# Patient Record
Sex: Female | Born: 1935 | Race: White | Hispanic: No | Smoking: Former smoker
Health system: Southern US, Community
[De-identification: ages and names within clinical notes are randomized; demographics above are authoritative.]

## PROBLEM LIST (undated history)

## (undated) DIAGNOSIS — T7840XA Allergy, unspecified, initial encounter: Secondary | ICD-10-CM

## (undated) DIAGNOSIS — S82009A Unspecified fracture of unspecified patella, initial encounter for closed fracture: Secondary | ICD-10-CM

## (undated) DIAGNOSIS — K635 Polyp of colon: Secondary | ICD-10-CM

## (undated) DIAGNOSIS — S42109A Fracture of unspecified part of scapula, unspecified shoulder, initial encounter for closed fracture: Secondary | ICD-10-CM

## (undated) DIAGNOSIS — K3189 Other diseases of stomach and duodenum: Secondary | ICD-10-CM

## (undated) DIAGNOSIS — M1990 Unspecified osteoarthritis, unspecified site: Secondary | ICD-10-CM

## (undated) DIAGNOSIS — I6529 Occlusion and stenosis of unspecified carotid artery: Secondary | ICD-10-CM

## (undated) DIAGNOSIS — I5032 Chronic diastolic (congestive) heart failure: Secondary | ICD-10-CM

## (undated) DIAGNOSIS — K219 Gastro-esophageal reflux disease without esophagitis: Secondary | ICD-10-CM

## (undated) DIAGNOSIS — R030 Elevated blood-pressure reading, without diagnosis of hypertension: Principal | ICD-10-CM

## (undated) DIAGNOSIS — K224 Dyskinesia of esophagus: Secondary | ICD-10-CM

## (undated) DIAGNOSIS — H8190 Unspecified disorder of vestibular function, unspecified ear: Secondary | ICD-10-CM

## (undated) DIAGNOSIS — D414 Neoplasm of uncertain behavior of bladder: Secondary | ICD-10-CM

## (undated) DIAGNOSIS — E785 Hyperlipidemia, unspecified: Secondary | ICD-10-CM

## (undated) DIAGNOSIS — I509 Heart failure, unspecified: Secondary | ICD-10-CM

## (undated) DIAGNOSIS — Z8619 Personal history of other infectious and parasitic diseases: Secondary | ICD-10-CM

## (undated) DIAGNOSIS — R42 Dizziness and giddiness: Secondary | ICD-10-CM

## (undated) DIAGNOSIS — K5790 Diverticulosis of intestine, part unspecified, without perforation or abscess without bleeding: Secondary | ICD-10-CM

## (undated) DIAGNOSIS — D649 Anemia, unspecified: Secondary | ICD-10-CM

## (undated) DIAGNOSIS — E039 Hypothyroidism, unspecified: Secondary | ICD-10-CM

## (undated) DIAGNOSIS — K31A0 Gastric intestinal metaplasia, unspecified: Secondary | ICD-10-CM

## (undated) DIAGNOSIS — D1803 Hemangioma of intra-abdominal structures: Secondary | ICD-10-CM

## (undated) DIAGNOSIS — G458 Other transient cerebral ischemic attacks and related syndromes: Secondary | ICD-10-CM

## (undated) HISTORY — DX: Dyskinesia of esophagus: K22.4

## (undated) HISTORY — PX: ELBOW SURGERY: SHX618

## (undated) HISTORY — DX: Allergy, unspecified, initial encounter: T78.40XA

## (undated) HISTORY — DX: Gastro-esophageal reflux disease without esophagitis: K21.9

## (undated) HISTORY — DX: Hemangioma of intra-abdominal structures: D18.03

## (undated) HISTORY — DX: Elevated blood-pressure reading, without diagnosis of hypertension: R03.0

## (undated) HISTORY — PX: ROTATOR CUFF REPAIR: SHX139

## (undated) HISTORY — DX: Heart failure, unspecified: I50.9

## (undated) HISTORY — DX: Dizziness and giddiness: R42

## (undated) HISTORY — DX: Unspecified fracture of unspecified patella, initial encounter for closed fracture: S82.009A

## (undated) HISTORY — DX: Unspecified disorder of vestibular function, unspecified ear: H81.90

## (undated) HISTORY — DX: Neoplasm of uncertain behavior of bladder: D41.4

## (undated) HISTORY — PX: TUBAL LIGATION: SHX77

## (undated) HISTORY — DX: Anemia, unspecified: D64.9

## (undated) HISTORY — PX: CHOLECYSTECTOMY: SHX55

## (undated) HISTORY — DX: Gastric intestinal metaplasia, unspecified: K31.A0

## (undated) HISTORY — DX: Polyp of colon: K63.5

## (undated) HISTORY — DX: Hyperlipidemia, unspecified: E78.5

## (undated) HISTORY — PX: COLONOSCOPY: SHX174

## (undated) HISTORY — PX: APPENDECTOMY: SHX54

## (undated) HISTORY — PX: CYSTECTOMY: SUR359

## (undated) HISTORY — DX: Personal history of other infectious and parasitic diseases: Z86.19

## (undated) HISTORY — DX: Fracture of unspecified part of scapula, unspecified shoulder, initial encounter for closed fracture: S42.109A

## (undated) HISTORY — DX: Other diseases of stomach and duodenum: K31.89

## (undated) HISTORY — DX: Occlusion and stenosis of unspecified carotid artery: I65.29

## (undated) HISTORY — DX: Unspecified osteoarthritis, unspecified site: M19.90

## (undated) HISTORY — PX: TONSILLECTOMY: SUR1361

## (undated) HISTORY — DX: Diverticulosis of intestine, part unspecified, without perforation or abscess without bleeding: K57.90

## (undated) HISTORY — DX: Hypothyroidism, unspecified: E03.9

## (undated) HISTORY — DX: Other transient cerebral ischemic attacks and related syndromes: G45.8

---

## 1997-12-29 HISTORY — PX: CAROTID-SUBCLAVIAN BYPASS GRAFT: SHX910

## 1998-05-22 ENCOUNTER — Other Ambulatory Visit: Admission: RE | Admit: 1998-05-22 | Discharge: 1998-05-22 | Payer: Self-pay | Admitting: *Deleted

## 1999-06-06 ENCOUNTER — Other Ambulatory Visit: Admission: RE | Admit: 1999-06-06 | Discharge: 1999-06-06 | Payer: Self-pay | Admitting: Obstetrics and Gynecology

## 2000-06-11 ENCOUNTER — Other Ambulatory Visit: Admission: RE | Admit: 2000-06-11 | Discharge: 2000-06-11 | Payer: Self-pay | Admitting: Obstetrics and Gynecology

## 2001-06-15 ENCOUNTER — Other Ambulatory Visit: Admission: RE | Admit: 2001-06-15 | Discharge: 2001-06-15 | Payer: Self-pay | Admitting: Obstetrics and Gynecology

## 2002-04-19 ENCOUNTER — Encounter: Payer: Self-pay | Admitting: Internal Medicine

## 2002-04-19 ENCOUNTER — Encounter: Admission: RE | Admit: 2002-04-19 | Discharge: 2002-04-19 | Payer: Self-pay | Admitting: Internal Medicine

## 2003-03-03 ENCOUNTER — Ambulatory Visit (HOSPITAL_COMMUNITY): Admission: RE | Admit: 2003-03-03 | Discharge: 2003-03-03 | Payer: Self-pay | Admitting: Internal Medicine

## 2003-03-03 ENCOUNTER — Encounter: Payer: Self-pay | Admitting: Internal Medicine

## 2003-07-11 ENCOUNTER — Encounter: Payer: Self-pay | Admitting: Obstetrics and Gynecology

## 2003-07-11 ENCOUNTER — Ambulatory Visit (HOSPITAL_COMMUNITY): Admission: RE | Admit: 2003-07-11 | Discharge: 2003-07-11 | Payer: Self-pay | Admitting: Obstetrics and Gynecology

## 2004-03-05 ENCOUNTER — Encounter: Payer: Self-pay | Admitting: Internal Medicine

## 2004-04-11 LAB — HM COLONOSCOPY

## 2004-11-06 ENCOUNTER — Ambulatory Visit: Payer: Self-pay | Admitting: Internal Medicine

## 2005-03-28 ENCOUNTER — Encounter: Payer: Self-pay | Admitting: Internal Medicine

## 2005-03-28 ENCOUNTER — Ambulatory Visit: Payer: Self-pay | Admitting: Internal Medicine

## 2005-04-01 ENCOUNTER — Ambulatory Visit: Payer: Self-pay | Admitting: Internal Medicine

## 2006-02-23 ENCOUNTER — Ambulatory Visit: Payer: Self-pay | Admitting: Internal Medicine

## 2006-03-04 ENCOUNTER — Encounter: Payer: Self-pay | Admitting: Internal Medicine

## 2006-03-26 ENCOUNTER — Ambulatory Visit: Payer: Self-pay | Admitting: Internal Medicine

## 2006-04-02 ENCOUNTER — Ambulatory Visit: Payer: Self-pay | Admitting: Internal Medicine

## 2006-05-12 ENCOUNTER — Ambulatory Visit: Payer: Self-pay | Admitting: Internal Medicine

## 2006-09-29 ENCOUNTER — Ambulatory Visit: Payer: Self-pay | Admitting: Internal Medicine

## 2006-11-02 ENCOUNTER — Ambulatory Visit: Payer: Self-pay | Admitting: Internal Medicine

## 2007-04-29 ENCOUNTER — Ambulatory Visit: Payer: Self-pay | Admitting: Internal Medicine

## 2007-04-29 LAB — CONVERTED CEMR LAB
ALT: 19 units/L (ref 0–40)
AST: 20 units/L (ref 0–37)
BUN: 13 mg/dL (ref 6–23)
CO2: 29 meq/L (ref 19–32)
Calcium: 9.6 mg/dL (ref 8.4–10.5)
Chloride: 108 meq/L (ref 96–112)
Cholesterol: 182 mg/dL (ref 0–200)
Creatinine, Ser: 0.8 mg/dL (ref 0.4–1.2)
GFR calc Af Amer: 90 mL/min
GFR calc non Af Amer: 74 mL/min
Glucose, Bld: 88 mg/dL (ref 70–99)
HCT: 44.2 % (ref 36.0–46.0)
HDL: 55 mg/dL (ref >39.0)
Hgb A1c MFr Bld: 5.2 % (ref 4.6–6.0)
LDL Cholesterol: 93 mg/dL (ref 0–99)
Potassium: 4.2 meq/L (ref 3.5–5.1)
Sodium: 143 meq/L (ref 135–145)
TSH: 2.23 microintl units/mL (ref 0.35–5.50)
Total CHOL/HDL Ratio: 3.3
Triglycerides: 172 mg/dL — ABNORMAL HIGH (ref 0–149)
VLDL: 34 mg/dL (ref 0–40)

## 2007-05-06 ENCOUNTER — Ambulatory Visit: Payer: Self-pay | Admitting: Internal Medicine

## 2007-08-19 DIAGNOSIS — E785 Hyperlipidemia, unspecified: Secondary | ICD-10-CM | POA: Insufficient documentation

## 2007-08-19 DIAGNOSIS — K219 Gastro-esophageal reflux disease without esophagitis: Secondary | ICD-10-CM | POA: Insufficient documentation

## 2007-08-19 DIAGNOSIS — E039 Hypothyroidism, unspecified: Secondary | ICD-10-CM | POA: Insufficient documentation

## 2007-08-19 DIAGNOSIS — M810 Age-related osteoporosis without current pathological fracture: Secondary | ICD-10-CM

## 2007-08-19 DIAGNOSIS — M1990 Unspecified osteoarthritis, unspecified site: Secondary | ICD-10-CM | POA: Insufficient documentation

## 2007-09-22 ENCOUNTER — Ambulatory Visit: Payer: Self-pay | Admitting: Internal Medicine

## 2007-09-22 DIAGNOSIS — M25569 Pain in unspecified knee: Secondary | ICD-10-CM

## 2007-11-02 ENCOUNTER — Ambulatory Visit: Payer: Self-pay | Admitting: Internal Medicine

## 2007-11-02 LAB — CONVERTED CEMR LAB
Cholesterol: 171 mg/dL (ref 0–200)
HDL: 53.9 mg/dL (ref >39.0)
LDL Cholesterol: 94 mg/dL (ref 0–99)
Total CHOL/HDL Ratio: 3.2
Triglycerides: 115 mg/dL (ref 0–149)
VLDL: 23 mg/dL (ref 0–40)

## 2007-11-09 ENCOUNTER — Ambulatory Visit: Payer: Self-pay | Admitting: Internal Medicine

## 2007-11-09 DIAGNOSIS — E559 Vitamin D deficiency, unspecified: Secondary | ICD-10-CM | POA: Insufficient documentation

## 2007-11-09 DIAGNOSIS — J309 Allergic rhinitis, unspecified: Secondary | ICD-10-CM | POA: Insufficient documentation

## 2007-11-09 DIAGNOSIS — M79609 Pain in unspecified limb: Secondary | ICD-10-CM | POA: Insufficient documentation

## 2007-11-09 LAB — CONVERTED CEMR LAB: Vit D, 1,25-Dihydroxy: 28 — ABNORMAL LOW (ref 30–89)

## 2007-11-19 ENCOUNTER — Encounter: Payer: Self-pay | Admitting: Internal Medicine

## 2008-03-03 ENCOUNTER — Ambulatory Visit: Payer: Self-pay | Admitting: Internal Medicine

## 2008-03-03 LAB — CONVERTED CEMR LAB: Total CK: 58 units/L (ref 7–177)

## 2008-03-09 LAB — CONVERTED CEMR LAB: Vit D, 1,25-Dihydroxy: 30 (ref 30–89)

## 2008-03-14 ENCOUNTER — Ambulatory Visit: Payer: Self-pay | Admitting: Internal Medicine

## 2008-09-12 ENCOUNTER — Ambulatory Visit: Payer: Self-pay | Admitting: Internal Medicine

## 2008-09-12 LAB — CONVERTED CEMR LAB
BUN: 15 mg/dL (ref 6–23)
Basophils Relative: 0.9 % (ref 0.0–3.0)
CO2: 27 meq/L (ref 19–32)
Chloride: 114 meq/L — ABNORMAL HIGH (ref 96–112)
Creatinine, Ser: 0.7 mg/dL (ref 0.4–1.2)
Eosinophils Absolute: 0.1 10*3/uL (ref 0.0–0.7)
Eosinophils Relative: 2.9 % (ref 0.0–5.0)
GFR calc non Af Amer: 86 mL/min
Hemoglobin: 14.9 g/dL (ref 12.0–15.0)
MCV: 97.7 fL (ref 78.0–100.0)
Neutro Abs: 2.6 10*3/uL (ref 1.4–7.7)
Neutrophils Relative %: 58.1 % (ref 43.0–77.0)
RBC: 4.35 M/uL (ref 3.87–5.11)
VLDL: 32 mg/dL (ref 0–40)
WBC: 4.4 10*3/uL — ABNORMAL LOW (ref 4.5–10.5)

## 2008-09-19 ENCOUNTER — Ambulatory Visit: Payer: Self-pay | Admitting: Internal Medicine

## 2009-02-14 ENCOUNTER — Encounter (INDEPENDENT_AMBULATORY_CARE_PROVIDER_SITE_OTHER): Payer: Self-pay | Admitting: *Deleted

## 2009-03-20 ENCOUNTER — Ambulatory Visit: Payer: Self-pay | Admitting: Internal Medicine

## 2009-03-20 DIAGNOSIS — R7309 Other abnormal glucose: Secondary | ICD-10-CM | POA: Insufficient documentation

## 2009-03-20 DIAGNOSIS — G4700 Insomnia, unspecified: Secondary | ICD-10-CM | POA: Insufficient documentation

## 2009-04-03 LAB — CONVERTED CEMR LAB: Vit D, 25-Hydroxy: 34 ng/mL (ref 30–89)

## 2009-04-16 ENCOUNTER — Telehealth: Payer: Self-pay | Admitting: Internal Medicine

## 2009-06-18 ENCOUNTER — Telehealth: Payer: Self-pay | Admitting: *Deleted

## 2009-09-18 ENCOUNTER — Ambulatory Visit: Payer: Self-pay | Admitting: Internal Medicine

## 2009-09-18 DIAGNOSIS — D485 Neoplasm of uncertain behavior of skin: Secondary | ICD-10-CM

## 2009-09-25 LAB — CONVERTED CEMR LAB
ALT: 18 units/L (ref 0–35)
AST: 17 units/L (ref 0–37)
Alkaline Phosphatase: 48 units/L (ref 39–117)
Bilirubin, Direct: 0.1 mg/dL (ref 0.0–0.3)
CO2: 29 meq/L (ref 19–32)
Chloride: 109 meq/L (ref 96–112)
Eosinophils Relative: 3.1 % (ref 0.0–5.0)
HCT: 40.9 % (ref 36.0–46.0)
Hemoglobin: 13.9 g/dL (ref 12.0–15.0)
Lymphs Abs: 1.5 10*3/uL (ref 0.7–4.0)
Monocytes Relative: 10.1 % (ref 3.0–12.0)
Neutro Abs: 2.6 10*3/uL (ref 1.4–7.7)
Platelets: 167 10*3/uL (ref 150.0–400.0)
Sodium: 141 meq/L (ref 135–145)
TSH: 0.67 microintl units/mL (ref 0.35–5.50)
Total CHOL/HDL Ratio: 3
Total Protein: 6.6 g/dL (ref 6.0–8.3)
WBC: 4.7 10*3/uL (ref 4.5–10.5)

## 2009-10-12 ENCOUNTER — Encounter: Payer: Self-pay | Admitting: Internal Medicine

## 2010-01-09 ENCOUNTER — Telehealth: Payer: Self-pay | Admitting: *Deleted

## 2010-03-19 ENCOUNTER — Ambulatory Visit: Payer: Self-pay | Admitting: Internal Medicine

## 2010-03-19 DIAGNOSIS — R269 Unspecified abnormalities of gait and mobility: Secondary | ICD-10-CM

## 2010-03-20 ENCOUNTER — Ambulatory Visit: Payer: Self-pay | Admitting: Internal Medicine

## 2010-05-24 ENCOUNTER — Encounter: Payer: Self-pay | Admitting: Internal Medicine

## 2010-05-28 ENCOUNTER — Telehealth: Payer: Self-pay | Admitting: *Deleted

## 2010-09-17 ENCOUNTER — Ambulatory Visit: Payer: Self-pay | Admitting: Internal Medicine

## 2010-09-17 LAB — CONVERTED CEMR LAB
Albumin: 3.8 g/dL (ref 3.5–5.2)
Alkaline Phosphatase: 56 units/L (ref 39–117)
Basophils Relative: 1.2 % (ref 0.0–3.0)
CO2: 23 meq/L (ref 19–32)
Chloride: 110 meq/L (ref 96–112)
Creatinine, Ser: 0.8 mg/dL (ref 0.4–1.2)
Eosinophils Relative: 2.8 % (ref 0.0–5.0)
HCT: 42.6 % (ref 36.0–46.0)
Hemoglobin: 14.5 g/dL (ref 12.0–15.0)
LDL Cholesterol: 89 mg/dL (ref 0–99)
Lymphs Abs: 1.5 10*3/uL (ref 0.7–4.0)
Monocytes Relative: 9.5 % (ref 3.0–12.0)
Neutro Abs: 2.6 10*3/uL (ref 1.4–7.7)
RBC: 4.34 M/uL (ref 3.87–5.11)
RDW: 14.1 % (ref 11.5–14.6)
Total CHOL/HDL Ratio: 3
Triglycerides: 140 mg/dL (ref 0.0–149.0)
WBC: 4.8 10*3/uL (ref 4.5–10.5)

## 2010-09-24 ENCOUNTER — Ambulatory Visit: Payer: Self-pay | Admitting: Internal Medicine

## 2010-09-24 DIAGNOSIS — G458 Other transient cerebral ischemic attacks and related syndromes: Secondary | ICD-10-CM

## 2010-10-01 ENCOUNTER — Encounter: Payer: Self-pay | Admitting: Internal Medicine

## 2010-10-01 ENCOUNTER — Ambulatory Visit: Payer: Self-pay | Admitting: Internal Medicine

## 2010-10-01 ENCOUNTER — Telehealth: Payer: Self-pay | Admitting: Internal Medicine

## 2010-10-08 ENCOUNTER — Encounter: Payer: Self-pay | Admitting: Internal Medicine

## 2010-10-08 ENCOUNTER — Encounter: Admission: RE | Admit: 2010-10-08 | Discharge: 2010-11-11 | Payer: Self-pay | Admitting: Internal Medicine

## 2010-11-08 ENCOUNTER — Encounter: Payer: Self-pay | Admitting: Internal Medicine

## 2011-01-28 NOTE — Miscellaneous (Signed)
Summary: BONE DENSITY  Clinical Lists Changes  Orders: Added new Test order of T-Bone Densitometry (77080) - Signed Added new Test order of T-Lumbar Vertebral Assessment (77082) - Signed 

## 2011-01-28 NOTE — Assessment & Plan Note (Signed)
Summary: FUP//CCM---PT [facility] // RS   Vital Signs:  Patient profile:   77 year old female Menstrual status:  postmenopausal Height:      62.25 inches Weight:      129.5 pounds BMI:     23.58 Pulse rate:   69 / minute BP sitting:   124 / 60  (right arm) Cuff size:   regular  Vitals Entered By: Romualdo Bolk, CMA (AAMA) (March 19, 2010 2:05 PM) CC: follow-up visit   History of Present Illness: Amy Fischer comesin .fr  6 month follow up of multiple  medical issues .  THyroid :  No change in meds  . Bone health Allergies : stable  HRT   taking 2 x per week.   Balance. getting worse  no falling  no weakness or numbness  wonder if  anything to do  ringing one ear right  for a while   also describes   8 seconds of blurred vision episode but no loss of vision or diplopia .    last one over a year ago. ( see 2007 )   denies  dementia symptom  Skin:   precancer  rx with freeze by derm    Preventive Screening-Counseling & Management  Alcohol-Tobacco     Alcohol drinks/day: <1     Alcohol type: wine and beer     Smoking Status: quit     Year Quit: 19 years ago  Caffeine-Diet-Exercise     Caffeine use/day: 0     Does Patient Exercise: yes     Type of exercise: aerobic or walking     Exercise (avg: min/session): 30-60     Times/week: 3  Current Medications (verified): 1)  Astelin 137 Mcg/spray Soln (Azelastine Hcl) .... Spray 2 Spray Into Both Nostrils Twice A Day 2)  Crestor 5 Mg Tabs (Rosuvastatin Calcium) .... Take 1/2 Tablet By Mouth Once A Day 3)  Levothyroxine Sodium 100 Mcg Tabs (Levothyroxine Sodium) .... Take 1 Tablet By Mouth Once A Day 4)  Ranitidine Hcl 300 Mg Tabs (Ranitidine Hcl) .[name].. 1 Tab By Mouth Two Times A Day 5)  Bayer Aspirin 325 Mg  Tabs (Aspirin) 6)  Estropipate 0.75 Mg  Tabs (Estropipate) .[name].. 1 By Mouth Once Daily. Dr Ambrose Mantle 7)  Medroxyprogesterone Acetate 5 Mg  Tabs (Medroxyprogesterone Acetate) .[name].. 1 By Mouth Once Daily Dr. Ambrose Mantle 8)  Zetia 10 Mg   Tabs (Ezetimibe) .[name].. 1 By Mouth Once Daily 9)  Fosamax Plus D 70-2800 Mg-Unit  Tabs (Alendronate-Cholecalciferol) .[name].. 1 By Mouth Every Week 10)  Multi-Vitamin   Tabs (Multiple Vitamin) 11)  Calcium 500/d 500-125 Mg-Unit  Tabs (Calcium Carbonate-Vitamin D) 12)  Glucosamine-Chondroitin 500-400 Mg  Caps (Glucosamine-Chondroitin) 13)  Fish Oil   Oil (Fish Oil) 14)  Stool Softener 100 Mg Caps (Docusate Sodium) 15)  Vitamin D 1000 Unit  Tabs (Cholecalciferol)  Allergies (verified): 1)  ! Codeine 2)  ! Actonel (Risedronate Sodium) 3)  ! * Statins  Past History:  Past medical, surgical, family and social histories (including risk factors) reviewed, and no changes noted (except as noted below).  Past Medical History: Reviewed history from 09/19/2008 and no changes required. GERD Hyperlipidemia Hypothyroidism Osteoarthritis Osteoporosis childbirth x4 mri head 03  recurrent vertigo Bladder polyps Allergic rhinitis     Past Surgical History: Appendectomy Cholecystectomy Colonoscopy-04/11/2004 Left carotid subclavian bypass1999   for [place] steal syndrome.  Past History:  Care Management:  Gynecology: Dr. Ambrose Mantle Vascular  in past : [facility]  History: Reviewed history from 09/18/2009 and no changes required. Family History of Arthritis Family History High cholesterol Family History Hypertension Family History Kidney disease Family History of Prostate CA 1st degree relative <50 Family History of Cardiovascular disorder Fam hx Stroke   Social History: Reviewed history from 09/18/2009 and no changes required. Former Smoker Alcohol use-yes Drug use-no Regular exercise-yes Widowed    HH of 1   no pets   Review of Systems  The patient denies anorexia, fever, weight loss, weight gain, hoarseness, chest pain, syncope, dyspnea on exertion, peripheral edema, prolonged cough, headaches, hemoptysis, abdominal pain, melena, hematochezia, severe indigestion/heartburn,  hematuria, muscle weakness, transient blindness, depression, unusual weight change, abnormal bleeding, enlarged lymph nodes, and angioedema.    Physical Exam  General:  alert, well-developed, well-nourished, well-hydrated, and normal appearance.  for age  Head:  normocephalic and atraumatic.   Eyes:  vision grossly intact, pupils equal, and pupils round.   Ears:  R ear normal, L ear normal, and no external deformities.   Neck:  No deformities, masses, or tenderness noted. Lungs:  Normal respiratory effort, chest expands symmetrically. Lungs are clear to auscultation, no crackles or wheezes.no dullness.   Heart:  Normal rate and regular rhythm. S1 and S2 normal without gallop, murmur, click, rub or other extra sounds. Abdomen:  Bowel sounds positive,abdomen soft and non-tender without masses, organomegaly or   noted. Msk:  no joint warmth and no redness over joints.  defomity   Pulses:  pulses intact without delay   Extremities:  no clubbing cyanosis or edema  Neurologic:  alert & oriented X3, strength normal in all extremities, sensation intact to light touch, and gait normal.  no clonus   absent  vibration sense  in distal foot bilaterally   Skin:  turgor normal and color normal.  senile changes  Cervical Nodes:  No lymphadenopathy noted Psych:  Oriented X3, memory intact for recent and remote, normally interactive, good eye contact, not anxious appearing, and not depressed appearing.     Impression & Recommendations:  Problem # 1:  HYPOTHYROIDISM (ICD-244.9)  Her updated medication list for this problem includes:    Levothyroxine Sodium 100 Mcg Tabs (Levothyroxine sodium) .[name]... Take 1 tablet by mouth once a day  Labs Reviewed: TSH: 0.67 (09/18/2009)    HgBA1c: 5.4 (03/20/2009) Chol: 157 (09/18/2009)   HDL: 52.70 (09/18/2009)   LDL: 82 (09/18/2009)   TG: 114.0 (09/18/2009)  Problem # 2:  OSTEOPOROSIS (ICD-733.00)  Her updated medication list for this problem includes:     Fosamax Plus D 70-2800 Mg-unit Tabs (Alendronate-cholecalciferol) .[name]... 1 by mouth every week    Calcium 500/d 500-125 Mg-unit Tabs (Calcium carbonate-vitamin d)    Vitamin D 1000 Unit Tabs (Cholecalciferol)  Vit D:34 (03/20/2009), 27 (09/12/2008)  Problem # 3:  OSTEOARTHRITIS (ICD-715.90)  hands are seemingly worse and deformed left thumb and middle finger   had evalu about 8 years ago and told she had OA  .  progressive   more involved pip joints  not a lot on dip.  affecting function Her updated medication list for this problem includes:    Bayer Aspirin 325 Mg Tabs (Aspirin)  Orders: T-Hand Left 3 Views (73130TC) T-Hand Right 3 views (73130TC)  Discussed use of medications,  Problem # 4:  HYPERLIPIDEMIA (ICD-272.4)  low dose    meds  Her updated medication list for this problem includes:    Crestor 5 Mg Tabs (Rosuvastatin calcium) .[name]... Take 1/2 tablet by mouth once a day  Zetia 10 Mg Tabs (Ezetimibe) .[name]... 1 by mouth once daily  Labs Reviewed: SGOT: 17 (09/18/2009)   SGPT: 18 (09/18/2009)   HDL:52.70 (09/18/2009), 54.6 (09/12/2008)  LDL:82 (09/18/2009), 111 (16/09/9603)  Chol:157 (09/18/2009), 197 (09/12/2008)  Trig:114.0 (09/18/2009), 158 (09/12/2008)  Problem # 5:  vision spells  rare and short lived .  x 1  no diplopia .  Unsure what cause of this is.   Problem # 6:  GAIT IMBALANCE (ICD-781.2)  dec vibration sense all toes distal foot .      ?  if peripheral neuropathy early        no hx diabetes althoug  had some hyperglycemia in the past.   Orders: Neurology Referral (Neuro)  Problem # 7:  hx [place] steal   with stent  no signs and well with this.   Complete Medication List: 1)  Astelin 137 Mcg/spray Soln (Azelastine hcl) .... Spray 2 spray into both nostrils twice a day 2)  Crestor 5 Mg Tabs (Rosuvastatin calcium) .... Take 1/2 tablet by mouth once a day 3)  Levothyroxine Sodium 100 Mcg Tabs (Levothyroxine sodium) .... Take 1 tablet by mouth once a day 4)   Ranitidine Hcl 300 Mg Tabs (Ranitidine hcl) .[name].. 1 tab by mouth two times a day 5)  Bayer Aspirin 325 Mg Tabs (Aspirin) 6)  Estropipate 0.75 Mg Tabs (Estropipate) .[name].. 1 by mouth once daily. dr Ambrose Mantle 7)  Medroxyprogesterone Acetate 5 Mg Tabs (Medroxyprogesterone acetate) .[name].. 1 by mouth once daily dr. Ambrose Mantle 8)  Zetia 10 Mg Tabs (Ezetimibe) .[name].. 1 by mouth once daily 9)  Fosamax Plus D 70-2800 Mg-unit Tabs (Alendronate-cholecalciferol) .[name].. 1 by mouth every week 10)  Multi-vitamin Tabs (Multiple vitamin) 11)  Calcium 500/d 500-125 Mg-unit Tabs (Calcium carbonate-vitamin d) 12)  Glucosamine-chondroitin 500-400 Mg Caps (Glucosamine-chondroitin) 13)  Fish Oil Oil (Fish oil) 14)  Stool Softener 100 Mg Caps (Docusate sodium) 15)  Vitamin D 1000 Unit Tabs (Cholecalciferol)  Patient Instructions: 1)  get xray of hands  when convenient.    2)  Someone Will contact you about  neurology consult . 3)  no change in meds .  4)  Please schedule a follow-up appointment in 6 months .  5)  BMP prior to visit, ICD-9:  6)  Hepatic Panel prior to visit ICD-9:  7)  Lipid panel prior to visit ICD-9 :  8)  TSH prior to visit ICD-9 :  9)  CBC w/ Diff prior to visit ICD-9 :  10)  B12 :  balance problem.

## 2011-01-28 NOTE — Progress Notes (Signed)
Summary: can't fill rx   Phone Note Call from Patient Call back at Home Phone [phone]   Caller: Patient Summary of Call: Pt called saying fhat she got a notice from her mail order company saying that they no longer can get her generic zantac 300mg . I left a message for pt to call back to let us know where she would like [place] to send rx to. Initial call taken by: Romualdo Bolk, CMA Duncan Dull),  January 09, 2010 2:04 PM  Follow-up for Phone Call        Pt called back saying that she wanted rx called into [facility]. Pt wants 90 days called in. Rx sent electronically. Follow-up by: Romualdo Bolk, CMA (AAMA),  January 09, 2010 4:55 PM    Prescriptions: RANITIDINE HCL 300 MG CAPS (RANITIDINE HCL) Take 1 capsule by mouth twice a day  #180 x 3   Entered by:   Romualdo Bolk, CMA (AAMA)   Authorized by:   Madelin Headings MD   Signed by:   Romualdo Bolk, CMA (AAMA) on 01/09/2010   Method used:   Electronically to        [facility]* (retail)       [address] [place], [place]  [address]       Ph: [phone]       Fax: [phone]   RxID:   [id number]   Appended Document: can't fill rx     Clinical Lists Changes  Medications: Changed medication from RANITIDINE HCL 300 MG CAPS (RANITIDINE HCL) Take 1 capsule by mouth twice a day to RANITIDINE HCL 300 MG TABS (RANITIDINE HCL) 1 tab by mouth two times a day

## 2011-01-28 NOTE — Miscellaneous (Signed)
Summary: Discharge Summary for PT [facility]  Discharge Summary for PT Services/[facility]   Imported By: Maryln Gottron 11/20/2010 13:54:25  _____________________________________________________________________  External Attachment:    Type:   Image     Comment:   External Document

## 2011-01-28 NOTE — Progress Notes (Signed)
  Phone Note Call from Patient Call back at [facility] Phone [phone]   Caller: Patient Summary of Call: Pt needs refills on fosamax, synthroid, zetia and crestor. Pt wants [place] to print rx's so she can mail it to them. Initial call taken by: Romualdo Bolk, CMA Duncan Dull),  May 28, 2010 5:01 PM  Follow-up for Phone Call        Pt to pick up on thurs Follow-up by: Romualdo Bolk, CMA (AAMA),  May 28, 2010 5:06 PM    Prescriptions: FOSAMAX PLUS D 70-2800 MG-UNIT  TABS (ALENDRONATE-CHOLECALCIFEROL) 1 by mouth every week  #12 x 3   Entered by:   Romualdo Bolk, CMA (AAMA)   Authorized by:   Madelin Headings MD   Signed by:   Romualdo Bolk, CMA (AAMA) on 05/28/2010   Method used:   Print then Give to Patient   RxID:   [id number] ZETIA 10 MG  TABS (EZETIMIBE) 1 by mouth once daily  #90 x 3   Entered by:   Romualdo Bolk, CMA (AAMA)   Authorized by:   Madelin Headings MD   Signed by:   Romualdo Bolk, CMA (AAMA) on 05/28/2010   Method used:   Print then Give to Patient   RxID:   [id number] LEVOTHYROXINE SODIUM 100 MCG TABS (LEVOTHYROXINE SODIUM) Take 1 tablet by mouth once a day  #90 x 3   Entered by:   Romualdo Bolk, CMA (AAMA)   Authorized by:   Madelin Headings MD   Signed by:   Romualdo Bolk, CMA (AAMA) on 05/28/2010   Method used:   Print then Give to Patient   RxID:   [id number] CRESTOR 5 MG TABS (ROSUVASTATIN CALCIUM) Take 1/2 tablet by mouth once a day  #45 x 3   Entered by:   Romualdo Bolk, CMA (AAMA)   Authorized by:   Madelin Headings MD   Signed by:   Romualdo Bolk, CMA (AAMA) on 05/28/2010   Method used:   Print then Give to Patient   RxID:   [id number]

## 2011-01-28 NOTE — Assessment & Plan Note (Signed)
Summary: 6 month rov/njr   Vital Signs:  Patient profile:   77 year old female Menstrual status:  postmenopausal Weight:      125 pounds Pulse rate:   72 / minute BP sitting:   120 / 60  (left arm) Cuff size:   regular  Vitals Entered By: Romualdo Bolk, CMA (AAMA) (September 24, 2010 1:09 PM) CC: Follow-up visit on labs   History of Present Illness: Amy Fischer comes in today  for follow up of multiple medical problems . Since last visit  then has seen neurologist . was told she could have mild peripheral neuropathy and that could effeect her ballance . Feels tired and worried about falling because balance seems off. Sleep  off and on melatonin off and on.  has seen Dr Webb Silversmith no problems  LIPIDS: no known se of meds  Carotid subclavian bypass   hx no dizziness neuro signs otherwise  Thyroid no change  in meds   Preventive Screening-Counseling & Management  Alcohol-Tobacco     Alcohol drinks/day: <1     Alcohol type: wine and beer     Smoking Status: quit     Year Quit: 19 years ago  Caffeine-Diet-Exercise     Caffeine use/day: 0     Does Patient Exercise: yes     Type of exercise: aerobic or walking     Exercise (avg: min/session): 30-60     Times/week: 3  Safety-Violence-Falls     Fall Risk: tripped in tub.      Fall Risk Counseling: counseling provided; falls with injury noted  Current Medications (verified): 1)  Astelin 137 Mcg/spray Soln (Azelastine Hcl) .... Spray 2 Spray Into Both Nostrils Twice A Day 2)  Crestor 5 Mg Tabs (Rosuvastatin Calcium) .... Take 1/2 Tablet By Mouth Once A Day 3)  Levothyroxine Sodium 100 Mcg Tabs (Levothyroxine Sodium) .... Take 1 Tablet By Mouth Once A Day 4)  Ranitidine Hcl 300 Mg Tabs (Ranitidine Hcl) .[name].. 1 Tab By Mouth Two Times A Day 5)  Bayer Aspirin 325 Mg  Tabs (Aspirin) 6)  Estropipate 0.75 Mg  Tabs (Estropipate) .[name].. 1 By Mouth 3 Times A Week. Dr Ambrose Mantle 7)  Medroxyprogesterone Acetate 5 Mg  Tabs  (Medroxyprogesterone Acetate) .[name].. 1 By Mouth 3 Times A Week Dr. Ambrose Mantle 8)  Zetia 10 Mg  Tabs (Ezetimibe) .[name].. 1 By Mouth Once Daily 9)  Fosamax Plus D 70-2800 Mg-Unit  Tabs (Alendronate-Cholecalciferol) .[name].. 1 By Mouth Every Week 10)  Multi-Vitamin   Tabs (Multiple Vitamin) 11)  Calcium 500/d 500-125 Mg-Unit  Tabs (Calcium Carbonate-Vitamin D) 12)  Glucosamine-Chondroitin 500-400 Mg  Caps (Glucosamine-Chondroitin) 13)  Fish Oil   Oil (Fish Oil) 14)  Stool Softener 100 Mg Caps (Docusate Sodium) 15)  Vitamin D 1000 Unit  Tabs (Cholecalciferol) 16)  Melatonin 3 Mg Tabs (Melatonin) .... Takes Every Other Month 17)  Diphenhydramine Hcl 25 Mg Caps (Diphenhydramine Hcl)  Allergies (verified): 1)  ! Codeine 2)  ! Actonel (Risedronate Sodium) 3)  ! * Statins  Past History:  Past medical, surgical, family and social histories (including risk factors) reviewed, and no changes noted (except as noted below).  Past Medical History: Reviewed history from 09/19/2008 and no changes required. GERD Hyperlipidemia Hypothyroidism Osteoarthritis Osteoporosis childbirth x4 mri head 03  recurrent vertigo Bladder polyps Allergic rhinitis     Past Surgical History: Reviewed history from 03/19/2010 and no changes required. 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  Past History:  Care Management:  Gynecology: Dr. Ambrose Mantle Vascular  in past : Amy Fischer [facility]  Family History: Reviewed history from 03/19/2010 and no changes required. Family History of Arthritis Family History High cholesterol Family History Hypertension Family History Kidney disease Family History of Prostate CA 1st degree relative <50 Family History of Cardiovascular disorder Fam hx Stroke   Social History: Reviewed history from 09/18/2009 and no changes required. Former Smoker Alcohol use-yes Drug use-no Regular exercise-yes Widowed    HH of 1    no pets Fall Risk:  tripped in tub.   Review of Systems  The patient denies anorexia, fever, weight loss, weight gain, chest pain, syncope, dyspnea on exertion, peripheral edema, prolonged cough, melena, hematochezia, muscle weakness, transient blindness, abnormal bleeding, enlarged lymph nodes, and angioedema.         acid reflux       elevated  hob.  to help  Physical Exam  General:  Well-developed,well-nourished,in no acute distress; alert,appropriate and cooperative throughout examination Head:  normocephalic and atraumatic.   Eyes:  vision grossly intact.   Neck:  No deformities, masses, or tenderness noted. Lungs:  Normal respiratory effort, chest expands symmetrically. Lungs are clear to auscultation, no crackles or wheezes.no dullness.   Heart:  Normal rate and regular rhythm. S1 and S2 normal without gallop, murmur, click, rub or other extra sounds. Abdomen:  Bowel sounds positive,abdomen soft and non-tender without masses, organomegaly or   noted. Msk:  no joint swelling and no joint warmth.  oa changes no watmth Pulses:  pulses intact without delay   Extremities:  no clubbing cyanosis or edema  Neurologic:  alert & oriented X3 and strength normal in all extremities.  Pt is A&Ox3,affect,speech,memory,attention,&motor skills appear intact.    gait is actually very good . but waits when arises to get balance  Skin:  turgor normal, no ecchymoses, and no petechiae.   Cervical Nodes:  No lymphadenopathy noted Psych:  Oriented X3, memory intact for recent and remote, normally interactive, good eye contact, not anxious appearing, and not depressed appearing.     Impression & Recommendations:  Problem # 1:  HYPERLIPIDEMIA (ICD-272.4)  Her updated medication list for this problem includes:    Crestor 5 Mg Tabs (Rosuvastatin calcium) .[name]... Take 1/2 tablet by mouth once a day    Zetia 10 Mg Tabs (Ezetimibe) .[name]... 1 by mouth once daily  Labs Reviewed: SGOT: 22 (09/17/2010)   SGPT:  17 (09/17/2010)   HDL:59.40 (09/17/2010), 52.70 (09/18/2009)  LDL:89 (09/17/2010), 82 (09/18/2009)  Chol:176 (09/17/2010), 157 (09/18/2009)  Trig:140.0 (09/17/2010), 114.0 (09/18/2009)  Problem # 2:  GAIT IMBALANCE (ICD-781.2) no falling   poss from slight Peripheral  neuropathy  Problem # 3:  ALLERGIC RHINITIS (ICD-477.9)  Her updated medication list for this problem includes:    Astelin 137 Mcg/spray Soln (Azelastine hcl) ..... Spray 2 spray into both nostrils twice a day    Diphenhydramine Hcl 25 Mg Caps (Diphenhydramine hcl)  Problem # 4:  OSTEOARTHRITIS (ICD-715.90)  Her updated medication list for this problem includes:    Bayer Aspirin 325 Mg Tabs (Aspirin)  Problem # 5:  GERD (ICD-530.81)  Her updated medication list for this problem includes:    Ranitidine Hcl 300 Mg Tabs (Ranitidine hcl) .[name]... 1 tab by mouth two times a day  Problem # 6:  OSTEOPOROSIS (ICD-733.00)  Her updated medication list for this problem includes:    Fosamax Plus D 70-2800 Mg-unit Tabs (Alendronate-cholecalciferol) .[name]... 1 by mouth every week  Calcium 500/d 500-125 Mg-unit Tabs (Calcium carbonate-vitamin d)    Vitamin D 1000 Unit Tabs (Cholecalciferol)  Vit D:34 (03/20/2009), 27 (09/12/2008)  Problem # 7:  HYPOTHYROIDISM (ICD-244.9)  Her updated medication list for this problem includes:    Levothyroxine Sodium 100 Mcg Tabs (Levothyroxine sodium) .[name]... Take 1 tablet by mouth once a day  Labs Reviewed: TSH: 0.37 (09/17/2010)    HgBA1c: 5.4 (03/20/2009) Chol: 176 (09/17/2010)   HDL: 59.40 (09/17/2010)   LDL: 89 (09/17/2010)   TG: 140.0 (09/17/2010)  Complete Medication List: 1)  Astelin 137 Mcg/spray Soln (Azelastine hcl) .... Spray 2 spray into both nostrils twice a day 2)  Crestor 5 Mg Tabs (Rosuvastatin calcium) .... Take 1/2 tablet by mouth once a day 3)  Levothyroxine Sodium 100 Mcg Tabs (Levothyroxine sodium) .... Take 1 tablet by mouth once a day 4)  Ranitidine Hcl 300 Mg Tabs  (Ranitidine hcl) .[name].. 1 tab by mouth two times a day 5)  Bayer Aspirin 325 Mg Tabs (Aspirin) 6)  Estropipate 0.75 Mg Tabs (Estropipate) .[name].. 1 by mouth 3 times a week. dr Ambrose Mantle 7)  Medroxyprogesterone Acetate 5 Mg Tabs (Medroxyprogesterone acetate) .[name].. 1 by mouth 3 times a week dr. Ambrose Mantle 8)  Zetia 10 Mg Tabs (Ezetimibe) .[name].. 1 by mouth once daily 9)  Fosamax Plus D 70-2800 Mg-unit Tabs (Alendronate-cholecalciferol) .[name].. 1 by mouth every week 10)  Multi-vitamin Tabs (Multiple vitamin) 11)  Calcium 500/d 500-125 Mg-unit Tabs (Calcium carbonate-vitamin d) 12)  Glucosamine-chondroitin 500-400 Mg Caps (Glucosamine-chondroitin) 13)  Fish Oil Oil (Fish oil) 14)  Stool Softener 100 Mg Caps (Docusate sodium) 15)  Vitamin D 1000 Unit Tabs (Cholecalciferol) 16)  Melatonin 3 Mg Tabs (Melatonin) .... Takes every other month 17)  Diphenhydramine Hcl 25 Mg Caps (Diphenhydramine hcl)  Other Orders: Flu Vaccine 60yrs + MEDICARE PATIENTS (Z6109) Administration Flu vaccine - MCR (U0454)  Patient Instructions: 1)  will get neuro note and then plan any other balance training.  2)  no change in meds . 3)  labs are good.  4)  schedule DEXA scan  . will let you know results when avaialble. 5)  rov in 6 months  or as needed.  Prescriptions: FOSAMAX PLUS D 70-2800 MG-UNIT  TABS (ALENDRONATE-CHOLECALCIFEROL) 1 by mouth every week  #12 x 3   Entered by:   Romualdo Bolk, CMA (AAMA)   Authorized by:   Madelin Headings MD   Signed by:   Romualdo Bolk, CMA (AAMA) on 09/24/2010   Method used:   Faxed to ...       [facility])       [address] [address]       [place], [place]  [address]       Ph: [phone]       Fax: [phone]   RxID:   [id number] ZETIA 10 MG  TABS (EZETIMIBE) 1 by mouth once daily  #90 x 3   Entered by:   Romualdo Bolk, CMA (AAMA)   Authorized by:   Madelin Headings MD   Signed by:   Romualdo Bolk, CMA (AAMA) on 09/24/2010   Method used:   Faxed to ...        [facility])       [address] [address]       [place], [place]  [address]       Ph: [phone]       Fax: [phone]   RxID:   [id number] RANITIDINE HCL 300 MG TABS (  RANITIDINE HCL) 1 tab by mouth two times a day  #180 x 3   Entered by:   Romualdo Bolk, CMA (AAMA)   Authorized by:   Madelin Headings MD   Signed by:   Romualdo Bolk, CMA (AAMA) on 09/24/2010   Method used:   Faxed to ...       [facility])       [address] [address]       [place], [place]  [address]       Ph: [phone]       Fax: [phone]   RxID:   [id number] LEVOTHYROXINE SODIUM 100 MCG TABS (LEVOTHYROXINE SODIUM) Take 1 tablet by mouth once a day  #90 x 3   Entered by:   Romualdo Bolk, CMA (AAMA)   Authorized by:   Madelin Headings MD   Signed by:   Romualdo Bolk, CMA (AAMA) on 09/24/2010   Method used:   Faxed to ...       [facility])       [address] [address]       [place], [place]  [address]       Ph: [phone]       Fax: [phone]   RxID:   [id number] CRESTOR 5 MG TABS (ROSUVASTATIN CALCIUM) Take 1/2 tablet by mouth once a day  #45 x 3   Entered by:   Romualdo Bolk, CMA (AAMA)   Authorized by:   Madelin Headings MD   Signed by:   Romualdo Bolk, CMA (AAMA) on 09/24/2010   Method used:   Faxed to ...       [facility])       [address] [address]       [place], [place]  [address]       Ph: [phone]       Fax: [phone]   RxID:   [id number]  Flu Vaccine Consent Questions     Do you have a history of severe allergic reactions to this vaccine? no    Any prior history of allergic reactions to egg and/or gelatin? no    Do you have a sensitivity to the preservative Thimersol? no    Do you have a past history of Guillan-Barre Syndrome? no    Do you currently have an acute febrile illness? no    Have you ever had a severe reaction to latex? no    Vaccine information given and explained to patient? yes    Are you currently pregnant?  no    Lot Number:[id number]   Exp Date:06/28/2011   Site Given  Left Deltoid IMflu Romualdo Bolk, CMA (AAMA)  September 24, 2010 1:13 PM

## 2011-01-28 NOTE — Progress Notes (Signed)
Summary: Amy Fischer keeps her up and gives her wild thoughts  Phone Note Call from Patient Call back at [facility] Phone [phone]   Caller: Patient Summary of Call: Pt called saying that she has tried Amy Fischer on 3 different occ. but can't take it due to it keeping her up at night and it gives her wild thoughts. She is wanting to know if there is another medication that she can try. Initial call taken by: Romualdo Bolk, CMA (AAMA),  October 01, 2010 12:04 PM  Follow-up for Phone Call        stop the medicine .   is sleeping  more of an issues or her nose congestion at this point  ? Also   lets go ahead and do a  referral for balance training fall prevention   Follow-up by: Madelin Headings MD,  October 01, 2010 12:45 PM  Additional Follow-up for Phone Call Additional follow up Details #1::        Spoke with pt and she is going to stop the medication. Pt states that the sleeping is more the problem. Order sent to [facility] for balance training fall prevention. Additional Follow-up by: Romualdo Bolk, CMA (AAMA),  October 01, 2010 1:43 PM   New Allergies: * SILENOR Additional Follow-up for Phone Call Additional follow up Details #2::    is she taking the mealtonin every night?   would try this first  then could try a sleep aid samples of low dose lunesta  1 mg      to begin (   if she want to try this. i have samples) .   Follow-up by: Madelin Headings MD,  October 03, 2010 12:14 PM  Additional Follow-up for Phone Call Additional follow up Details #3:: Details for Additional Follow-up Action Taken: LMTOCB Romualdo Bolk, CMA Duncan Dull)  October 03, 2010 12:23 PM Pt called back saying that she is taking melatonin and this has worked so far. She will call back if she has any more problems. Additional Follow-up by: Romualdo Bolk, CMA (AAMA),  October 04, 2010 8:44 AM  New Allergies: Rush [place]mark

## 2011-01-31 NOTE — Letter (Signed)
Summary: [facility]  [facility]   Imported By: Maryln Gottron 06/12/2010 13:02:28  _____________________________________________________________________  External Attachment:    Type:   Image     Comment:   External Document

## 2011-01-31 NOTE — Miscellaneous (Signed)
Summary: Initial Summary for PT Services/[facility]  Initial Summary for PT Services/[facility]   Imported By: Maryln Gottron 10/17/2010 11:03:51  _____________________________________________________________________  External Attachment:    Type:   Image     Comment:   External Document

## 2011-03-20 ENCOUNTER — Encounter: Payer: Self-pay | Admitting: Internal Medicine

## 2011-03-25 ENCOUNTER — Ambulatory Visit (INDEPENDENT_AMBULATORY_CARE_PROVIDER_SITE_OTHER): Payer: Medicare Other | Admitting: Internal Medicine

## 2011-03-25 ENCOUNTER — Encounter: Payer: Self-pay | Admitting: Internal Medicine

## 2011-03-25 VITALS — BP 130/80 | HR 60 | Wt 128.0 lb

## 2011-03-25 DIAGNOSIS — E039 Hypothyroidism, unspecified: Secondary | ICD-10-CM

## 2011-03-25 DIAGNOSIS — R7309 Other abnormal glucose: Secondary | ICD-10-CM

## 2011-03-25 DIAGNOSIS — E785 Hyperlipidemia, unspecified: Secondary | ICD-10-CM

## 2011-03-25 DIAGNOSIS — M1990 Unspecified osteoarthritis, unspecified site: Secondary | ICD-10-CM

## 2011-03-25 DIAGNOSIS — IMO0001 Reserved for inherently not codable concepts without codable children: Secondary | ICD-10-CM

## 2011-03-25 DIAGNOSIS — R030 Elevated blood-pressure reading, without diagnosis of hypertension: Secondary | ICD-10-CM

## 2011-03-25 HISTORY — DX: Reserved for inherently not codable concepts without codable children: IMO0001

## 2011-03-25 LAB — GLUCOSE, POCT (MANUAL RESULT ENTRY): POC Glucose: 96
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
## 2011-03-25 NOTE — Patient Instructions (Signed)
Check your blood pressure readings  At least 3 times a week when relaxed  .  If the readings  Are consistently over 150  On the top reading then  Call for advice or return office visit  Otherwise  return office visit for medicare wellness visit in 6 months

## 2011-04-04 ENCOUNTER — Encounter: Payer: Self-pay | Admitting: Internal Medicine

## 2011-04-04 NOTE — Assessment & Plan Note (Signed)
Bp readings borderline today has hx of elvation  In office and ok at home   Not checke recently   She gfeels was elevated from anxiety

## 2011-09-23 ENCOUNTER — Encounter: Payer: Self-pay | Admitting: Internal Medicine

## 2011-09-23 ENCOUNTER — Ambulatory Visit (INDEPENDENT_AMBULATORY_CARE_PROVIDER_SITE_OTHER): Payer: Medicare Other | Admitting: Internal Medicine

## 2011-09-23 VITALS — BP 110/70 | HR 60 | Ht 61.75 in | Wt 127.0 lb

## 2011-09-23 DIAGNOSIS — E785 Hyperlipidemia, unspecified: Secondary | ICD-10-CM

## 2011-09-23 DIAGNOSIS — Z0000 Encounter for general adult medical examination without abnormal findings: Secondary | ICD-10-CM

## 2011-09-23 DIAGNOSIS — R7309 Other abnormal glucose: Secondary | ICD-10-CM

## 2011-09-23 DIAGNOSIS — E039 Hypothyroidism, unspecified: Secondary | ICD-10-CM

## 2011-09-23 DIAGNOSIS — Z136 Encounter for screening for cardiovascular disorders: Secondary | ICD-10-CM

## 2011-09-23 DIAGNOSIS — G458 Other transient cerebral ischemic attacks and related syndromes: Secondary | ICD-10-CM

## 2011-09-23 DIAGNOSIS — M1990 Unspecified osteoarthritis, unspecified site: Secondary | ICD-10-CM

## 2011-09-23 DIAGNOSIS — K219 Gastro-esophageal reflux disease without esophagitis: Secondary | ICD-10-CM

## 2011-09-23 DIAGNOSIS — Z23 Encounter for immunization: Secondary | ICD-10-CM

## 2011-09-23 DIAGNOSIS — R030 Elevated blood-pressure reading, without diagnosis of hypertension: Secondary | ICD-10-CM

## 2011-09-23 DIAGNOSIS — M810 Age-related osteoporosis without current pathological fracture: Secondary | ICD-10-CM

## 2011-09-23 DIAGNOSIS — Z79890 Hormone replacement therapy (postmenopausal): Secondary | ICD-10-CM

## 2011-09-23 LAB — HEPATIC FUNCTION PANEL
ALT: 16 U/L (ref 0–35)
AST: 21 U/L (ref 0–37)
Bilirubin, Direct: 0 mg/dL (ref 0.0–0.3)
Total Bilirubin: 0.4 mg/dL (ref 0.3–1.2)

## 2011-09-23 LAB — BASIC METABOLIC PANEL
BUN: 16 mg/dL (ref 6–23)
Chloride: 108 mEq/L (ref 96–112)
Creatinine, Ser: 0.8 mg/dL (ref 0.4–1.2)
GFR: 77.73 mL/min (ref >60.00)
Potassium: 5.1 mEq/L (ref 3.5–5.1)

## 2011-09-23 LAB — HEMOGLOBIN A1C: Hgb A1c MFr Bld: 5.3 % (ref 4.6–6.5)

## 2011-09-23 LAB — CBC WITH DIFFERENTIAL/PLATELET
Basophils Absolute: 0 10*3/uL (ref 0.0–0.1)
Eosinophils Absolute: 0.1 10*3/uL (ref 0.0–0.7)
Lymphocytes Relative: 23.9 % (ref 12.0–46.0)
MCHC: 33.2 g/dL (ref 30.0–36.0)
Monocytes Relative: 7.8 % (ref 3.0–12.0)
Neutrophils Relative %: 66.8 % (ref 43.0–77.0)
RDW: 14.5 % (ref 11.5–14.6)

## 2011-09-23 LAB — LIPID PANEL
Cholesterol: 178 mg/dL (ref 0–200)
LDL Cholesterol: 83 mg/dL (ref 0–99)
Total CHOL/HDL Ratio: 3
Triglycerides: 139 mg/dL (ref 0.0–149.0)
VLDL: 27.8 mg/dL (ref 0.0–40.0)

## 2011-09-23 LAB — TSH: TSH: 0.13 u[IU]/mL — ABNORMAL LOW (ref 0.35–5.50)

## 2011-09-23 MED ORDER — ROSUVASTATIN CALCIUM 5 MG PO TABS: ORAL_TABLET | ORAL | Status: DC

## 2011-09-23 MED ORDER — ALENDRONATE-CHOLECALCIFEROL 70-2800 MG-UNIT PO TABS: 1.0000 | ORAL_TABLET | ORAL | Status: DC

## 2011-09-23 MED ORDER — LEVOTHYROXINE SODIUM 100 MCG PO TABS: 100.0000 ug | ORAL_TABLET | Freq: Every day | ORAL | Status: DC

## 2011-09-23 MED ORDER — EZETIMIBE 10 MG PO TABS: 10.0000 mg | ORAL_TABLET | Freq: Every day | ORAL | Status: DC

## 2011-09-23 MED ORDER — RANITIDINE HCL 300 MG PO CAPS: 300.0000 mg | ORAL_CAPSULE | Freq: Every evening | ORAL | Status: DC
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
## 2011-09-23 NOTE — Assessment & Plan Note (Signed)
reviewed readings  Usually 120- 130 range ocass 140 and one 160 felt from stress. Seems ok for now.

## 2011-09-23 NOTE — Patient Instructions (Signed)
Will refill medications. Continue lifestyle intervention healthy eating and exercise . Call if you are not taking 1/2 of crestor 5 mg to clarify medication dose We can  Consider  A break from the fosamax  If on for 10 years of more.  Check blood pressure readings occasionally. Will notify you  of labs when available. ROV in 6 months or as needed

## 2011-10-07 ENCOUNTER — Telehealth: Payer: Self-pay | Admitting: *Deleted

## 2011-10-07 DIAGNOSIS — E039 Hypothyroidism, unspecified: Secondary | ICD-10-CM

## 2011-10-07 MED ORDER — LEVOTHYROXINE SODIUM 88 MCG PO TABS: 88.0000 ug | ORAL_TABLET | Freq: Every day | ORAL | Status: DC

## 2011-10-07 NOTE — Telephone Encounter (Signed)
Pt aware and orders placed in epic. Rx called in.

## 2011-10-07 NOTE — Telephone Encounter (Signed)
Message copied by Romualdo Bolk on Tue Oct 07, 2011  4:00 PM ------      Message from: [facility] [place], [place] K      Created: Mon Oct 06, 2011  6:16 PM       Advise patient that her labs are normal except her thyroid shows too much thyroid replacement at this time.      We should decrease the synthroid to .088mg  per day  Disp 90 and recheck tsh in 2-3 months after change .

## 2011-11-21 ENCOUNTER — Observation Stay (HOSPITAL_BASED_OUTPATIENT_CLINIC_OR_DEPARTMENT_OTHER)
Admission: EM | Admit: 2011-11-21 | Discharge: 2011-11-22 | Disposition: A | Discharge status: Home / Self Care | Payer: Medicare Other | Source: Ambulatory Visit | Attending: Internal Medicine | Admitting: Internal Medicine

## 2011-11-21 ENCOUNTER — Emergency Department (INDEPENDENT_AMBULATORY_CARE_PROVIDER_SITE_OTHER): Payer: Medicare Other

## 2011-11-21 ENCOUNTER — Other Ambulatory Visit: Payer: Self-pay

## 2011-11-21 ENCOUNTER — Encounter (HOSPITAL_BASED_OUTPATIENT_CLINIC_OR_DEPARTMENT_OTHER): Payer: Self-pay

## 2011-11-21 ENCOUNTER — Telehealth: Payer: Self-pay | Admitting: *Deleted

## 2011-11-21 DIAGNOSIS — R079 Chest pain, unspecified: Secondary | ICD-10-CM

## 2011-11-21 DIAGNOSIS — R0789 Other chest pain: Principal | ICD-10-CM | POA: Insufficient documentation

## 2011-11-21 DIAGNOSIS — R0602 Shortness of breath: Secondary | ICD-10-CM | POA: Diagnosis present

## 2011-11-21 DIAGNOSIS — E785 Hyperlipidemia, unspecified: Secondary | ICD-10-CM | POA: Insufficient documentation

## 2011-11-21 DIAGNOSIS — M810 Age-related osteoporosis without current pathological fracture: Secondary | ICD-10-CM | POA: Insufficient documentation

## 2011-11-21 DIAGNOSIS — E039 Hypothyroidism, unspecified: Secondary | ICD-10-CM | POA: Insufficient documentation

## 2011-11-21 DIAGNOSIS — K219 Gastro-esophageal reflux disease without esophagitis: Secondary | ICD-10-CM | POA: Insufficient documentation

## 2011-11-21 DIAGNOSIS — J449 Chronic obstructive pulmonary disease, unspecified: Secondary | ICD-10-CM

## 2011-11-21 LAB — CARDIAC PANEL(CRET KIN+CKTOT+MB+TROPI)
Relative Index: INVALID (ref 0.0–2.5)
Total CK: 94 U/L (ref 7–177)
Troponin I: <0.3 ng/mL (ref <0.30)

## 2011-11-21 LAB — COMPREHENSIVE METABOLIC PANEL
Albumin: 3.8 g/dL (ref 3.5–5.2)
Alkaline Phosphatase: 70 U/L (ref 39–117)
BUN: 19 mg/dL (ref 6–23)
Calcium: 10.1 mg/dL (ref 8.4–10.5)
Creatinine, Ser: 0.7 mg/dL (ref 0.50–1.10)
GFR calc Af Amer: >90 mL/min (ref >90)
Glucose, Bld: 103 mg/dL — ABNORMAL HIGH (ref 70–99)
Potassium: 4 mEq/L (ref 3.5–5.1)
Total Protein: 7.2 g/dL (ref 6.0–8.3)

## 2011-11-21 LAB — CBC
HCT: 40.8 % (ref 36.0–46.0)
Hemoglobin: 13.9 g/dL (ref 12.0–15.0)
MCH: 31.9 pg (ref 26.0–34.0)
MCHC: 34.1 g/dL (ref 30.0–36.0)
MCV: 93.6 fL (ref 78.0–100.0)
MCV: 94.3 fL (ref 78.0–100.0)
Platelets: 211 10*3/uL (ref 150–400)
RBC: 4.36 MIL/uL (ref 3.87–5.11)
RDW: 13.3 % (ref 11.5–15.5)
WBC: 6 10*3/uL (ref 4.0–10.5)

## 2011-11-21 LAB — DIFFERENTIAL
Basophils Relative: 1 % (ref 0–1)
Eosinophils Absolute: 0.2 10*3/uL (ref 0.0–0.7)
Eosinophils Relative: 3 % (ref 0–5)
Lymphs Abs: 1.6 10*3/uL (ref 0.7–4.0)
Monocytes Absolute: 0.8 10*3/uL (ref 0.1–1.0)
Monocytes Relative: 10 % (ref 3–12)
Neutrophils Relative %: 64 % (ref 43–77)

## 2011-11-21 LAB — LIPASE, BLOOD: Lipase: 33 U/L (ref 11–59)

## 2011-11-21 LAB — CREATININE, SERUM: GFR calc Af Amer: >90 mL/min (ref >90)

## 2011-11-21 MED ORDER — SODIUM CHLORIDE 0.9 % IJ SOLN
3.0000 mL | Freq: Two times a day (BID) | INTRAMUSCULAR | Status: DC
  Administered 2011-11-22: 10:00:00 via INTRAVENOUS

## 2011-11-21 MED ORDER — ONDANSETRON HCL 4 MG/2ML IJ SOLN: 4.0000 mg | Freq: Four times a day (QID) | INTRAMUSCULAR | Status: DC | PRN

## 2011-11-21 MED ORDER — ASPIRIN EC 81 MG PO TBEC: 81.0000 mg | DELAYED_RELEASE_TABLET | Freq: Every day | ORAL | Status: DC

## 2011-11-21 MED ORDER — FAMOTIDINE 20 MG PO TABS
20.0000 mg | ORAL_TABLET | Freq: Every day | ORAL | Status: DC
  Administered 2011-11-22: 20 mg via ORAL
  Filled 2011-11-21: qty 1

## 2011-11-21 MED ORDER — LEVOTHYROXINE SODIUM 88 MCG PO TABS
88.0000 ug | ORAL_TABLET | Freq: Every day | ORAL | Status: DC
  Administered 2011-11-22: 88 ug via ORAL
  Filled 2011-11-21 (×2): qty 1

## 2011-11-21 MED ORDER — MORPHINE SULFATE 2 MG/ML IJ SOLN
2.0000 mg | Freq: Once | INTRAMUSCULAR | Status: AC
  Administered 2011-11-21: 2 mg via INTRAVENOUS

## 2011-11-21 MED ORDER — MORPHINE SULFATE 2 MG/ML IJ SOLN: 1.0000 mg | INTRAMUSCULAR | Status: DC | PRN

## 2011-11-21 MED ORDER — ASPIRIN 300 MG RE SUPP
300.0000 mg | RECTAL | Status: AC
  Administered 2011-11-21: 300 mg via RECTAL
  Filled 2011-11-21: qty 1

## 2011-11-21 MED ORDER — MORPHINE SULFATE 4 MG/ML IJ SOLN
4.0000 mg | Freq: Once | INTRAMUSCULAR | Status: AC
  Administered 2011-11-21: 4 mg via INTRAVENOUS
  Filled 2011-11-21: qty 1

## 2011-11-21 MED ORDER — AZELASTINE HCL 0.1 % NA SOLN
2.0000 | Freq: Two times a day (BID) | NASAL | Status: DC
  Administered 2011-11-21 – 2011-11-22 (×2): 2 via NASAL
  Filled 2011-11-21: qty 30

## 2011-11-21 MED ORDER — SODIUM CHLORIDE 0.9 % IJ SOLN: 3.0000 mL | INTRAMUSCULAR | Status: DC | PRN

## 2011-11-21 MED ORDER — HEPARIN SODIUM (PORCINE) 5000 UNIT/ML IJ SOLN
5000.0000 [IU] | Freq: Three times a day (TID) | INTRAMUSCULAR | Status: DC
  Administered 2011-11-21 – 2011-11-22 (×2): 5000 [IU] via SUBCUTANEOUS
  Filled 2011-11-21 (×5): qty 1

## 2011-11-21 MED ORDER — ACETAMINOPHEN 325 MG PO TABS: 650.0000 mg | ORAL_TABLET | ORAL | Status: DC | PRN

## 2011-11-21 MED ORDER — EZETIMIBE 10 MG PO TABS
10.0000 mg | ORAL_TABLET | Freq: Every day | ORAL | Status: DC
  Administered 2011-11-22: 10 mg via ORAL
  Filled 2011-11-21: qty 1

## 2011-11-21 MED ORDER — ROSUVASTATIN CALCIUM 5 MG PO TABS
2.5000 mg | ORAL_TABLET | Freq: Every day | ORAL | Status: DC
  Administered 2011-11-22: 2.5 mg via ORAL
  Filled 2011-11-21: qty 0.5

## 2011-11-21 MED ORDER — NITROGLYCERIN 0.4 MG SL SUBL: 0.4000 mg | SUBLINGUAL_TABLET | SUBLINGUAL | Status: DC | PRN

## 2011-11-21 MED ORDER — MORPHINE SULFATE 2 MG/ML IJ SOLN: 2.0000 mg | Freq: Once | INTRAMUSCULAR | Status: DC

## 2011-11-21 MED ORDER — SODIUM CHLORIDE 0.9 % IV SOLN
250.0000 mL | INTRAVENOUS | Status: DC
  Administered 2011-11-21: 250 mL via INTRAVENOUS

## 2011-11-21 MED ORDER — ASPIRIN 325 MG PO TABS
325.0000 mg | ORAL_TABLET | Freq: Every day | ORAL | Status: DC
  Administered 2011-11-22: 325 mg via ORAL
  Filled 2011-11-21: qty 1

## 2011-11-21 MED ORDER — MORPHINE SULFATE 2 MG/ML IJ SOLN
INTRAMUSCULAR | Status: AC
  Administered 2011-11-21: 2 mg via INTRAVENOUS
  Filled 2011-11-21: qty 1

## 2011-11-21 NOTE — ED Provider Notes (Signed)
History     CSN: [id number] Arrival date & time: 11/21/2011  3:15 PM   First MD Initiated Contact with Patient 11/21/11 1518      Chief Complaint  Patient presents with  . Chest Pain     HPI  77 year old female history of GERD, hyperlipidemia presents with chest discomfort. The patient states that she initially expands chest discomfort 2 days ago which she described as dull and nonradiating. She denies back pain, arm pain, jaw pain. He states to that episode 2 days ago lasted for approximately 12 hours and resolved after she took aspirin. She did have shortness of breath associated with the episode as well. She was asymptomatic yesterday. The pain returned mid morning and has been constant all day. She currently describes the pain as "discomfort", rating it as 1/10. She feels fatigued. She denies abdominal pain nausea vomiting diaphoresis. She denies fevers, chills, cough. There no sick contacts. She denies history of congestive heart failure or acute coronary syndrome. She did take a full-strength aspirin prior to arrival.  Denies h/o VTE in self. No recent hosp/surg/immob. No h/o cancer. Denies exogenous hormone use, no leg pain or swelling   Past Medical History  Diagnosis Date  . GERD (gastroesophageal reflux disease)   . Hyperlipidemia   . Hypothyroidism   . Osteoarthritis   . Osteoporosis   . Episodic recurrent vertigo     MRI Head 2003  . Allergy   . Bladder polyps   . Subclavian steal syndrome     bypass 1999    Past Surgical History  Procedure Date  . Appendectomy   . Cholecystectomy   . Carotid-subclavian bypass graft 1999    left for  steal syndrome    Family History  Problem Relation Age of Onset  . Hypertension Mother   . Heart disease Father     History  Substance Use Topics  . Smoking status: Former Games developer  . Smokeless tobacco: Not on file  . Alcohol Use: Yes     socially    OB History    Grav Para Term Preterm Abortions TAB SAB Ect Mult  Living                  Review of Systems  All other systems reviewed and are negative.   except as noted HPI   Allergies  Codeine; Risedronate sodium; and Statins  Home Medications   Current Outpatient Rx  Name Route Sig Dispense Refill  . ALENDRONATE-CHOLECALCIFEROL 70-2800 MG-UNIT PO TABS Oral Take 1 tablet by mouth every 7 (seven) days. Take with a full glass of water on an empty stomach. 12 tablet 3  . ASPIRIN 325 MG PO TABS Oral Take 325 mg by mouth daily.      . AZELASTINE HCL 137 MCG/SPRAY NA SOLN Nasal 2 sprays by Nasal route 2 (two) times daily. Use in each nostril as directed     . CALCIUM CARBONATE-VITAMIN D 500-200 MG-UNIT PO TABS Oral Take 1 tablet by mouth daily.      [name] VITAMIN D3 1000 UNITS PO CAPS Oral Take by mouth.      [name] DIPHENHYDRAMINE HCL (SLEEP) 25 MG PO TABS Oral Take 25 mg by mouth at bedtime as needed.      [name] DOCUSATE SODIUM 100 MG PO CAPS Oral Take 100 mg by mouth 2 (two) times daily.      [name] ESTROPIPATE 0.75 MG PO TABS Oral Take 0.75 mg by mouth daily. 3 times a week     .  EZETIMIBE 10 MG PO TABS Oral Take 1 tablet (10 mg total) by mouth daily. 90 tablet 3  . OMEGA-3 FATTY ACIDS 1000 MG PO CAPS Oral Take 2 g by mouth daily.      [name] GLUCOSAMINE-CHONDROITIN 500-400 MG PO TABS Oral Take 1 tablet by mouth 3 (three) times daily.      [name] LEVOTHYROXINE SODIUM 88 MCG PO TABS Oral Take 1 tablet (88 mcg total) by mouth daily. 90 tablet 1  . MEDROXYPROGESTERONE ACETATE 5 MG PO TABS Oral Take 5 mg by mouth daily.      [name] MELATONIN 3 MG PO TABS Oral Take by mouth.      . MULTIVITAMINS PO TABS Oral Take 1 tablet by mouth daily.      [name] RANITIDINE HCL 300 MG PO CAPS Oral Take 1 capsule (300 mg total) by mouth every evening. 90 capsule 3  . ROSUVASTATIN CALCIUM 5 MG PO TABS  1/2 tab daily 45 tablet 3    BP 168/68  Pulse 72  Temp(Src) 97.9 F (36.6 C) (Oral)  Resp 18  Ht 5\' 2"  (1.575 m)  Wt 123 lb (55.792 kg)  BMI 22.50 kg/m2  SpO2 95%  Physical Exam  Nursing  note and vitals reviewed. Constitutional: She is oriented to person, place, and time. She appears well-developed.  HENT:  Head: Atraumatic.  Mouth/Throat: Oropharynx is clear and moist.  Eyes: Conjunctivae and EOM are normal. Pupils are equal, round, and reactive to light.  Neck: Normal range of motion. Neck supple.  Cardiovascular: Normal rate, regular rhythm, normal heart sounds and intact distal pulses.   Pulmonary/Chest: Effort normal and breath sounds normal. No respiratory distress. She has no wheezes. She has no rales. She exhibits tenderness.       +sternal ttp, does not reproduce her pain Bibasilar crackles  Abdominal: Soft. She exhibits no distension. There is no tenderness. There is no rebound and no guarding.  Musculoskeletal: Normal range of motion.  Neurological: She is alert and oriented to person, place, and time.  Skin: Skin is warm and dry. No rash noted.  Psychiatric: She has a normal mood and affect.    Date: 11/21/2011  Rate: 66  Rhythm: normal sinus rhythm  QRS Axis: normal  Intervals: normal  ST/T Wave abnormalities: nonspecific ST changes  Conduction Disutrbances:none  Narrative Interpretation:   Old EKG Reviewed: none available   ED Course  Procedures (including critical care time)  Labs Reviewed  COMPREHENSIVE METABOLIC PANEL - Abnormal; Notable for the following:    Glucose, Bld 103 (*)    GFR calc non Af Amer 80 (*)    All other components within normal limits  CARDIAC PANEL(CRET KIN+CKTOT+MB+TROPI) - Abnormal; Notable for the following:    CK, MB 4.3 (*)    All other components within normal limits  CBC  DIFFERENTIAL  LIPASE, BLOOD  PRO B NATRIURETIC PEPTIDE   Dg Chest 2 View  11/21/2011  *RADIOLOGY REPORT*  Clinical Data: Chest pain and shortness of breath.  CHEST - 2 VIEW  Comparison: None.  Findings: Trachea is midline.  Surgical clips are seen in the left supraclavicular region.  Heart size within normal limits.  Thoracic aorta is  calcified.  Right apical pleural parenchymal scarring. Lungs are hyperinflated but otherwise clear.  No pleural fluid. Degenerative changes are seen in the spine.  There may be mild compression of a mid thoracic vertebral body.  IMPRESSION: COPD without acute finding.  Original Report Authenticated By: Reyes Ivan, M.D.  1. Chest pain   2. Shortness of breath       MDM  82yoF with chest discomfort. CXR, EKG, labs. Anticipate admit.  Stefano Gaul, MD   4:59 PM  Labs reviewed and generally unremarkable. D/W [facility] Dr. Georgiann Mohs who accepted patient for further w/u and evalution. [facility] telemetry, Team 5       Forbes Cellar, MD 11/21/11 1700

## 2011-11-21 NOTE — ED Notes (Signed)
C/o CP that started 2 days ago "discomfort"

## 2011-11-21 NOTE — ED Notes (Signed)
After 6mg  of morphine IV, pt continues to c/o chest tightness. [facility] to transport at this time. Step down bed to be assigned.

## 2011-11-21 NOTE — Telephone Encounter (Signed)
Pt is calling in to ask to see Dr. Fabian Sharp to see her for chest heaviness.  Advised to go to the ER ASAP.

## 2011-11-21 NOTE — H&P (Signed)
Primary Care Physician:  Berniece Andreas MD  Chief Complaint: Epigastric pain for 3 days  History of Present Illness: Patient is a 77 year old woman history of hypertension, hyperlipidemia, GERD, subclavian steal syndrome status post bypass in 1999 who presents for evaluation of epigastric pain. Patient reports that she was in her usual state of health until about 3 days ago when she started to experience some localized epigastric pain. Denies any chest tightness, palpitations, nausea or vomiting. Not associated with food. Does report associated lightheadedness and dizziness when she turns her head. No new dyspnea on exertion, lower extremity edema, PND, orthopnea. Does report that she had similar pain 4 years ago, and it was diagnosed as esophageal spasm at that time. Came in today because symptoms became worse, and her daughter who is an emergency room nurse encouraged her to come in for further evaluation. The patient reports quitting smoking 20 years ago. Has been following closely with her physician although mentions that she has not had a cardiac evaluation.  At the outside institution, temperature was noted to be 97.1, blood pressure 160/68, heart rate 72, respirations 18, satting 95% on room air. Initial EKG negative for ischemic changes. Troponin was negative although CK-MB was slightly elevated at 4.3. Patient was admitted for ACS rule out. Currently status post morphine the patient reports that her epigastric pain has near fully resolved, now more of a discomfort.   Past Medical History  Diagnosis Date  . GERD (gastroesophageal reflux disease)   . Hyperlipidemia   . Hypothyroidism   . Osteoarthritis   . Osteoporosis   . Episodic recurrent vertigo     MRI Head 2003  . Allergy   . Bladder polyps   . Subclavian steal syndrome     bypass 1999    Past Surgical History  Procedure Date  . Appendectomy   . Cholecystectomy   . Carotid-subclavian bypass graft 1999    left for [place] steal  syndrome    Allergies  Allergen Reactions  . Codeine Nausea And Vomiting  . Risedronate Sodium     unknown  . Statins     Muscles hurt    Medications: No current facility-administered medications on file prior to encounter.   Current Outpatient Prescriptions on File Prior to Encounter  Medication Sig Dispense Refill  . aspirin 325 MG tablet Take 325 mg by mouth daily.        [name] azelastine (ASTELIN) 137 MCG/SPRAY nasal spray 2 sprays by Nasal route 2 (two) times daily. Use in each nostril as directed       . calcium-vitamin D (OSCAL WITH D) 500-200 MG-UNIT per tablet Take 1 tablet by mouth daily.        . Cholecalciferol (VITAMIN D3) 1000 UNITS CAPS Take 1 capsule by mouth daily.       . diphenhydrAMINE (SOMINEX) 25 MG tablet Take 25 mg by mouth at bedtime as needed. For allergies and sleep      . docusate sodium (COLACE) 100 MG capsule Take 100 mg by mouth at bedtime.       [name] estropipate (OGEN) 0.75 MG tablet Take 0.75 mg by mouth 3 (three) times a week. Take on Monday, Wednesday and Friday days 1-25      . ezetimibe (ZETIA) 10 MG tablet Take 1 tablet (10 mg total) by mouth daily.  90 tablet  3  . fish oil-omega-3 fatty acids 1000 MG capsule Take 1 g by mouth daily.       [name] glucosamine-chondroitin 500-400 MG  tablet Take 1 tablet by mouth daily.       [name] levothyroxine (SYNTHROID) 88 MCG tablet Take 1 tablet (88 mcg total) by mouth daily.  90 tablet  1  . medroxyPROGESTERone (PROVERA) 5 MG tablet Take 5 mg by mouth 3 (three) times a week. Take on Monday, Wednesday and Friday of days 13-25      . Melatonin 3 MG TABS Take 1 tablet by mouth every other day.       . multivitamin (THERAGRAN) per tablet Take 1 tablet by mouth daily.        . ranitidine (ZANTAC) 300 MG capsule Take 1 capsule (300 mg total) by mouth every evening.  90 capsule  3   Family History  Problem Relation Age of Onset  . Hypertension Mother   . Heart disease Father     History   Social History  . Marital Status:  Widowed    Spouse Name: N/A    Number of Children: N/A  . Years of Education: N/A   Social History Main Topics  . Smoking status: Former Games developer  . Smokeless tobacco: None  . Alcohol Use: Yes     socially  . Drug Use: No  . Sexually Active:    Other Topics Concern  . None   Social History Narrative   WidowedHH of 1 No petsFormer smokerExercises regularly   Review of Systems: General: No fevers, chills, sweats, night sweats, weight loss Skin: No rashes or lacerations HEENT: No rhinorrhea, sore throat, dry mouth, hearing difficulties Pulmonary: No cough, wheezing, shortness of breath Cardivascular: As per history of present illness  Gastrointestinal: No abdominal pain, dysphagia, odynophagia, nausea, vomiting, hematemesis, melena, hematochezia, bowel changes Genitourinary: No dysuria, hematuria, increased urinary frequency/urgency. No discharge Musculoskeletal: No muscle aches, pain. No arthritis Hematologic: No easy bruising or bleeding Neurologic: As per history of present illness Psychologic: No suicidial or homicidal ideation. No depression  Filed Vitals:   11/21/11 1514 11/21/11 1527 11/21/11 1714 11/21/11 1815  BP:  168/68 182/77 101/88  Pulse:  72    Temp:  97.9 F (36.6 C)    TempSrc:  Oral    Resp:  18 18 18   Height: 5\' 2"  (1.575 m)     Weight: 55.792 kg (123 lb)     SpO2:  95% 100% 100%    Physical Exam: General: Alert and oriented x 3, no apparent distress Skin: No rashes, bruises HEENT: Head atraumatic, sclera anicertic, pupils equal and reactive to light, oropharynx moist with tonsils unremarkable Neck: Soft, no lymphadenopathy, thyromegaly, or bruits Chest: Clear to auscultation bilaterally, no wheezes, rales, or ronchi Heart: Regular rate and rhythm, normal S1/S2 no rubs, gallops, or murmurs Abdomen: Soft, nontender, nondistended, + bowel sounds, no masses Extremities: No cyanosis, clubbing, or edema. 2+ radial and dorsalis pedis pulses  bilaterally Neurologic: Grossly intact  Labs: CBC    Component Value Date/Time   WBC 7.4 11/21/2011 1548   RBC 4.36 11/21/2011 1548   HGB 13.9 11/21/2011 1548   HCT 40.8 11/21/2011 1548   PLT 228 11/21/2011 1548   MCV 93.6 11/21/2011 1548   MCH 31.9 11/21/2011 1548   MCHC 34.1 11/21/2011 1548   RDW 13.3 11/21/2011 1548   LYMPHSABS 1.6 11/21/2011 1548   MONOABS 0.8 11/21/2011 1548   EOSABS 0.2 11/21/2011 1548   BASOSABS 0.0 11/21/2011 1548    BMET    Component Value Date/Time   NA 140 11/21/2011 1548   K 4.0 11/21/2011 1548   CL 107  11/21/2011 1548   CO2 23 11/21/2011 1548   GLUCOSE 103* 11/21/2011 1548   BUN 19 11/21/2011 1548   CREATININE 0.70 11/21/2011 1548   CALCIUM 10.1 11/21/2011 1548   GFRNONAA 80* 11/21/2011 1548   GFRAA >90 11/21/2011 1548    Troponin 0.30 CKMB 4.3 CK total 94  BNP 327  Liver function tests: AST 21 ALT 16 Alk Phos 70 Total bilirubin 0.3  CXR COPD without acute finding.  EKG (my read): sinus with no ischemic changes   Impression/Plan:  77 year old woman history of hypertension, hyperlipidemia, GERD, subclavian steal syndrome status post bypass in 1999 who presents for evaluation of epigastric pain of unclear etiology, initial troponin EKG negative.  Epigastric pain: Differential considerations include atypical ischemic chest pain versus esophageal etiology.  Epigastric pain has near fully resolved status post Morphine - Admit to telemetry - Serial EKG and enzymes to rule out ACS - Continue home cardiac and GERD medications - Pain control PRN with Morphine for now  Hypertension, hyperlipidemia, and GERD As above. Continue home medications.  Fluid/electrolytes/nutrition: - Hep-locked IV - Monitor electrolytes daily - Cardiac diet  Prophylaxis: - Lovenox  CODE STATUS: Full code

## 2011-11-22 ENCOUNTER — Other Ambulatory Visit: Payer: Self-pay

## 2011-11-22 DIAGNOSIS — I059 Rheumatic mitral valve disease, unspecified: Secondary | ICD-10-CM

## 2011-11-22 DIAGNOSIS — R0602 Shortness of breath: Secondary | ICD-10-CM | POA: Diagnosis present

## 2011-11-22 DIAGNOSIS — R079 Chest pain, unspecified: Secondary | ICD-10-CM | POA: Diagnosis present

## 2011-11-22 LAB — CARDIAC PANEL(CRET KIN+CKTOT+MB+TROPI)
CK, MB: 2.9 ng/mL (ref 0.3–4.0)
Relative Index: INVALID (ref 0.0–2.5)
Total CK: 54 U/L (ref 7–177)
Troponin I: <0.3 ng/mL (ref <0.30)
Troponin I: <0.3 ng/mL (ref <0.30)

## 2011-11-22 LAB — CBC
HCT: 37.8 % (ref 36.0–46.0)
MCHC: 33.3 g/dL (ref 30.0–36.0)
RDW: 13.4 % (ref 11.5–15.5)

## 2011-11-22 LAB — BASIC METABOLIC PANEL
BUN: 21 mg/dL (ref 6–23)
Calcium: 9.5 mg/dL (ref 8.4–10.5)
Creatinine, Ser: 0.8 mg/dL (ref 0.50–1.10)
GFR calc Af Amer: 79 mL/min — ABNORMAL LOW (ref >90)
GFR calc non Af Amer: 68 mL/min — ABNORMAL LOW (ref >90)

## 2011-11-22 LAB — MAGNESIUM: Magnesium: 2.1 mg/dL (ref 1.5–2.5)

## 2011-11-22 MED ORDER — NITROGLYCERIN 0.4 MG SL SUBL: 0.4000 mg | SUBLINGUAL_TABLET | SUBLINGUAL | Status: DC | PRN

## 2011-11-22 NOTE — Discharge Summary (Signed)
Physician Discharge Summary  Patient ID: Amy Fischer MRN: [id number] DOB/AGE: 06-Nov-1929 77 y.o.  Admit date: 11/21/2011 Discharge date: 11/22/2011  Primary Care Physician:  Lorretta Harp, MD  Final Discharge Diagnoses:    . Atypical Chest pain: Resolved, possibly GERD  . Shortness of breath: Resolved  Secondary diagnosis: GERD Osteoporosis Hypothyroidism Hyperlipidemia  Consults: None   Discharge Medications: Current Discharge Medication List    START taking these medications   Details  nitroGLYCERIN (NITROSTAT) 0.4 MG SL tablet Place 1 tablet (0.4 mg total) under the tongue every 5 (five) minutes as needed for chest pain. Qty: 30 tablet, Refills: 1      CONTINUE these medications which have NOT CHANGED   Details  alendronate-cholecalciferol (FOSAMAX PLUS D) 70-2800 MG-UNIT per tablet Take 1 tablet by mouth every 7 (seven) days. Take on Monday. Take with a full glass of water on an empty stomach.     aspirin 325 MG tablet Take 325 mg by mouth daily.      azelastine (ASTELIN) 137 MCG/SPRAY nasal spray 2 sprays by Nasal route 2 (two) times daily. Use in each nostril as directed     calcium-vitamin D (OSCAL WITH D) 500-200 MG-UNIT per tablet Take 1 tablet by mouth daily.      Cholecalciferol (VITAMIN D3) 1000 UNITS CAPS Take 1 capsule by mouth daily.     diphenhydrAMINE (SOMINEX) 25 MG tablet Take 25 mg by mouth at bedtime as needed. For allergies and sleep    docusate sodium (COLACE) 100 MG capsule Take 100 mg by mouth at bedtime.     estropipate (OGEN) 0.75 MG tablet Take 0.75 mg by mouth 3 (three) times a week. Take on Monday, Wednesday and Friday days 1-25    ezetimibe (ZETIA) 10 MG tablet Take 1 tablet (10 mg total) by mouth daily. Qty: 90 tablet, Refills: 3    fish oil-omega-3 fatty acids 1000 MG capsule Take 1 g by mouth daily.     glucosamine-chondroitin 500-400 MG tablet Take 1 tablet by mouth daily.     levothyroxine (SYNTHROID) 88 MCG tablet  Take 1 tablet (88 mcg total) by mouth daily. Qty: 90 tablet, Refills: 1    medroxyPROGESTERone (PROVERA) 5 MG tablet Take 5 mg by mouth 3 (three) times a week. Take on Monday, Wednesday and Friday of days 13-25    Melatonin 3 MG TABS Take 1 tablet by mouth every other day.     multivitamin (THERAGRAN) per tablet Take 1 tablet by mouth daily.      ranitidine (RANITIDINE ACID REDUCER) 75 MG tablet Take 75 mg by mouth every morning.      ranitidine (ZANTAC) 300 MG capsule Take 1 capsule (300 mg total) by mouth every evening. Qty: 90 capsule, Refills: 3    rosuvastatin (CRESTOR) 5 MG tablet Take 2.5 mg by mouth daily.           Significant Diagnostic Studies:   CHEST - 2 VIEW 11/21/2011 Comparison: None.  Findings: Trachea is midline. Surgical clips are seen in the left  supraclavicular region. Heart size within normal limits. Thoracic  aorta is calcified. Right apical pleural parenchymal scarring.  Lungs are hyperinflated but otherwise clear. No pleural fluid.  Degenerative changes are seen in the spine. There may be mild  compression of a mid thoracic vertebral body.  IMPRESSION:  COPD without acute finding.    2-D Echo 11/22/2011:  Study Conclusions  - Left ventricle: The cavity size was normal. Wall thickness was increased in a pattern  of moderate LVH. Systolic function was normal. The estimated ejection fraction was in the range of 60% to 65%. Wall motion was normal; there were no regional wall motion abnormalities. - Mitral valve: Mild regurgitation. - Atrial septum: No defect or patent foramen ovale was identified.  12lead EKG on admission: rate 66, NSR, No acute ST-T wave changes suggestive of any ischemia  Brief H and P: For complete details please refer to admission H and P, but in brief, patient is 77 year old female with hypertension hyperlipidemia GERD presented for evaluation of epigastric/chest pain. Patient has a history of GERD, on ranitidine. Patient  states that she had some associated lightheadedness, dizziness, some shortness of breath at the time of  the chest pain. Patient was admitted for rule out ACS.  Hospital Course:   Amy Fischer was admitted to the hospital for atypical chest pain. Serial cardiac enzymes were obtained and 3 sets troponins have remained negative to date. All the lab work was essentially unremarkable. 12-lead EKG did not show any acute ST-T wave changes just above any ischemia. Patient remained on the telemetry monitor and was ruled out for any arrhythmias. As the patient stated that this pain was "a bit different" from usual GERD, 2-D echocardiogram was obtained. 2-D echo showed preserved EF of 60-65% with no regional wall motion abnormalities. Chest x-ray was also obtained which showed clear lungs with no pneumonia. Patient's chest pain has completely resolved. I gave her a prescription of sublingual nitroglycerin tablets. I also recommended her proton pump inhibitor for acid reflux, however, patient stated that she has not tolerated PPIs in the past. She is on Fosamax which can exacerbate the GERD symptoms as well. Patient will be discharged home today in stable condition. She was counseled to followup with her primary care physician, Dr. Fabian Sharp in 7-10 days for hospital followup or earlier if she has recurring symptoms.   Day of Discharge BP 137/50  Pulse 54  Temp(Src) 97.6 F (36.4 C) (Oral)  Resp 13  Ht 5\' 2"  (1.575 m)  Wt 57 kg (125 lb 10.6 oz)  BMI 22.98 kg/m2  SpO2 98%  LAB RESULTS: Basic Metabolic Panel:  Lab 11/22/11 1191 11/21/11 2134 11/21/11 1548  NA 143 -- 140  K 4.1 -- 4.0  CL 109 -- 107  CO2 24 -- 23  GLUCOSE 90 -- 103*  BUN 21 -- 19  CREATININE 0.80 0.72 --  CALCIUM 9.5 -- 10.1  MG 2.1 -- --  PHOS -- -- --   Liver Function Tests:  Lab 11/21/11 1548  AST 21  ALT 16  ALKPHOS 70  BILITOT 0.3  PROT 7.2  ALBUMIN 3.8    Lab 11/21/11 1548  LIPASE 33  AMYLASE --   CBC:  Lab  11/22/11 0640 11/21/11 2134 11/21/11 1548  WBC 4.8 6.0 --  NEUTROABS -- -- 4.7  HGB 12.6 13.5 --  HCT 37.8 39.5 --  MCV 95.5 -- --  PLT 200 211 --   Cardiac Enzymes:  Lab 11/22/11 0635 11/21/11 2313  CKTOTAL 54 72  CKMB 2.9 3.5  CKMBINDEX -- --  TROPONINI <0.30 <0.30   BNP:  Lab 11/21/11 1547  POCBNP 326.8     Physical Exam: General: Alert and awake oriented x3 not in any acute distress. HEENT: anicteric sclera, pupils reactive to light and accommodation CVS: S1-S2 clear no murmur rubs or gallops Chest: clear to auscultation bilaterally, no wheezing rales or rhonchi Abdomen: soft nontender, nondistended, normal bowel sounds, no organomegaly Extremities: no cyanosis,  clubbing or edema noted bilaterally Neuro: Cranial nerves II-XII intact, no focal neurological deficits   Disposition and Follow-up: Discharge Orders    Future Appointments: Provider: Department: Dept Phone: Center:   03/23/2012 10:30 AM Simmie Davies [facility] [phone]     Future Orders Please Complete By Expires   Diet - low sodium heart healthy      Increase activity slowly      Discharge instructions      Comments:   Please call your PCP or return to ER if Chest pain/ symptoms recurring       DISPOSITION: Home  DIET: Heart healthy  ACTIVITY: As tolerated   DISCHARGE FOLLOW-UP Follow-up Information    Follow up with Lorretta Harp. Make an appointment in 10 days. (call asap if chest pain/ shortness of breath recurring if symptoms worsen)    Contact information:   [address] [place] [address] [phone]          Time spent on Discharge: 35 minutes  Signed: Mohmmad Saleeby 11/22/2011, 11:36 AM

## 2011-11-22 NOTE — Progress Notes (Signed)
*  PRELIMINARY RESULTS* Echocardiogram 2D Echocardiogram has been performed.  Clide Deutscher RDCS 11/22/2011, 9:30 AM

## 2011-11-24 NOTE — Progress Notes (Signed)
UTILIZATION REVIEW COMPLETE Amy Fischer 11/24/2011 [phone] OR [phone]

## 2011-11-27 ENCOUNTER — Encounter: Payer: Self-pay | Admitting: Internal Medicine

## 2011-11-27 ENCOUNTER — Encounter: Payer: Self-pay | Admitting: Neurology

## 2011-11-27 ENCOUNTER — Ambulatory Visit (INDEPENDENT_AMBULATORY_CARE_PROVIDER_SITE_OTHER): Payer: Medicare Other | Admitting: Internal Medicine

## 2011-11-27 ENCOUNTER — Telehealth: Payer: Self-pay | Admitting: Internal Medicine

## 2011-11-27 DIAGNOSIS — R0789 Other chest pain: Secondary | ICD-10-CM

## 2011-11-27 DIAGNOSIS — G458 Other transient cerebral ischemic attacks and related syndromes: Secondary | ICD-10-CM

## 2011-11-27 DIAGNOSIS — R42 Dizziness and giddiness: Secondary | ICD-10-CM

## 2011-11-27 DIAGNOSIS — M810 Age-related osteoporosis without current pathological fracture: Secondary | ICD-10-CM

## 2011-11-27 DIAGNOSIS — K229 Disease of esophagus, unspecified: Secondary | ICD-10-CM | POA: Insufficient documentation

## 2011-11-27 DIAGNOSIS — R269 Unspecified abnormalities of gait and mobility: Secondary | ICD-10-CM

## 2011-11-27 MED ORDER — RANITIDINE HCL 300 MG PO CAPS: 300.0000 mg | ORAL_CAPSULE | Freq: Two times a day (BID) | ORAL | Status: DC

## 2011-11-27 NOTE — Progress Notes (Signed)
Subjective:    Patient ID: Amy Fischer, female    DOB: [birth year], 77 y.o.   MRN: [id number]  HPI  Pt comesin for fu of hospitalization for atypical chest pain  And some sob off and on . Soon after onset had episode of  Dizziness she calls  Vertigo when she turned her head suddenly  So she sat down on the floor to keep from falling and injuring her self   now some better   Now more light headed. But foggy. No numbness focal weakness or LOV ?  Evaluation in the [facility] was R/O ACS and felt to have neg eval . Working dx is esophageal origine but was dced on nitroglycerin   ( helped with morphine  In hospital ed) Hasn't need to use. Echo done ef 60 - 65 mod lvh . GERD: has of bad diarrhea with on of the PPIS given in the past per er Christella Hartigan and has done ok on ranitidine but recently  rx was only written for qd dosing.  Osteoporosis: is on   fosamax  ?  If gets dysphagia at all.  Thyroid no change  Review of Systems No sob sweating focal weakness falling  Feels imalanced at times .   Past Medical History  Diagnosis Date  . GERD (gastroesophageal reflux disease)   . Hyperlipidemia   . Hypothyroidism   . Osteoarthritis   . Osteoporosis   . Episodic recurrent vertigo     MRI Head 2003  . Allergy   . Bladder polyps   . Subclavian steal syndrome     bypass 1999    History   Social History  . Marital Status: Widowed    Spouse Name: N/A    Number of Children: N/A  . Years of Education: N/A   Occupational History  . Not on file.   Social History Main Topics  . Smoking status: Former Games developer  . Smokeless tobacco: Not on file  . Alcohol Use: Yes     socially  . Drug Use: No  . Sexually Active:    Other Topics Concern  . Not on file   Social History Narrative   WidowedHH of 1 No petsFormer smokerExercises regularly    Past Surgical History  Procedure Date  . Appendectomy   . Cholecystectomy   . Carotid-subclavian bypass graft 1999    left for [place] steal syndrome     Family History  Problem Relation Age of Onset  . Hypertension Mother   . Heart disease Father     Allergies  Allergen Reactions  . Codeine Nausea And Vomiting  . Risedronate Sodium     unknown  . Statins     Muscles hurt     Current Outpatient Prescriptions on File Prior to Visit  Medication Sig Dispense Refill  . alendronate-cholecalciferol (FOSAMAX PLUS D) 70-2800 MG-UNIT per tablet Take 1 tablet by mouth every 7 (seven) days. Take on Monday. Take with a full glass of water on an empty stomach.       [name] aspirin 325 MG tablet Take 325 mg by mouth daily.        [name] azelastine (ASTELIN) 137 MCG/SPRAY nasal spray 2 sprays by Nasal route 2 (two) times daily. Use in each nostril as directed       . calcium-vitamin D (OSCAL WITH D) 500-200 MG-UNIT per tablet Take 1 tablet by mouth daily.        . Cholecalciferol (VITAMIN D3) 1000 UNITS CAPS Take 1 capsule by  mouth daily.       . diphenhydrAMINE (SOMINEX) 25 MG tablet Take 25 mg by mouth at bedtime as needed. For allergies and sleep      . docusate sodium (COLACE) 100 MG capsule Take 100 mg by mouth at bedtime.       [name] estropipate (OGEN) 0.75 MG tablet Take 0.75 mg by mouth 3 (three) times a week. Take on Monday, Wednesday and Friday days 1-25      . ezetimibe (ZETIA) 10 MG tablet Take 1 tablet (10 mg total) by mouth daily.  90 tablet  3  . fish oil-omega-3 fatty acids 1000 MG capsule Take 1 g by mouth daily.       [name] glucosamine-chondroitin 500-400 MG tablet Take 1 tablet by mouth daily.       [name] levothyroxine (SYNTHROID) 88 MCG tablet Take 1 tablet (88 mcg total) by mouth daily.  90 tablet  1  . medroxyPROGESTERone (PROVERA) 5 MG tablet Take 5 mg by mouth 3 (three) times a week. Take on Monday, Wednesday and Friday of days 13-25      . Melatonin 3 MG TABS Take 1 tablet by mouth every other day.       . multivitamin (THERAGRAN) per tablet Take 1 tablet by mouth daily.        . nitroGLYCERIN (NITROSTAT) 0.4 MG SL tablet Place 1 tablet  (0.4 mg total) under the tongue every 5 (five) minutes as needed for chest pain.  30 tablet  1  . rosuvastatin (CRESTOR) 5 MG tablet Take 2.5 mg by mouth daily.        [name] DISCONTD: ranitidine (RANITIDINE ACID REDUCER) 75 MG tablet Take 75 mg by mouth every morning.        [name] DISCONTD: ranitidine (ZANTAC) 300 MG capsule Take 1 capsule (300 mg total) by mouth every evening.  90 capsule  3    BP 130/78  Pulse 68  Temp(Src) 98.1 F (36.7 C) (Oral)  Wt 126 lb 8 oz (57.38 kg)  SpO2 96%       Objective:   Physical Exam Wt Readings from Last 3 Encounters:  11/27/11 126 lb 8 oz (57.38 kg)  11/21/11 125 lb 10.6 oz (57 kg)  09/23/11 127 lb (57.607 kg)    WDWN alert in nad with obv OA  Oriented x 3 and no obvious deficits in memory, attention, and speech. [place] AT perrla tms clear op tongue midline .  Chest:  Clear to A&P without wheezes rales or rhonchi CV:  S1-S2 no gallops or murmurs peripheral perfusion is normal Abdomen:  Sof,t normal bowel sounds without hepatosplenomegaly, no guarding rebound or masses no CVA tenderness No clubbing cyanosis or edema Gait  Seems steady  And facile  Neg rhomberg.   Reviewed ed and Hosp record.       Assessment & Plan:  Atypical chest upper abd discomfort  With risk hx  Suspect Gi esophageal cause  And fosamax  Aggravated .   Hx se of one of the PPIs ? Which one. Increase the ranitidine  Back to  bid  Consider another trial in future of ppi if we can see what  Med caused the diarrhea Stop the fosamax for now  Currently not pleased with idea or of iv meds  Consider other options  In future for osteoporosis Dizziness  Poss peripheral vertigo but happening amidst all of above .  Instead of further testing will get opinion from neuro consult to see if any other  investigations indicated. Hypothyroid stable.

## 2011-11-27 NOTE — Patient Instructions (Signed)
Stop  Fosamax for now.  Increase ranitidine to twice a day I think the pain is esophagus pain . We may need to come up with other options.  about the osteoporosis The dizziness.  Seems like vertigo.  But get opinion. from neurology.  We will get Dr Juanda Chance to see you also  About   Poss esophageal problem causing your discomfort .

## 2011-11-27 NOTE — Telephone Encounter (Signed)
Patient scheduled for 12/16/11 Joni Reining with Dr Fabian Sharp will notify the patient

## 2011-12-11 ENCOUNTER — Encounter: Payer: Self-pay | Admitting: *Deleted

## 2011-12-16 ENCOUNTER — Ambulatory Visit (INDEPENDENT_AMBULATORY_CARE_PROVIDER_SITE_OTHER): Payer: Medicare Other | Admitting: Internal Medicine

## 2011-12-16 ENCOUNTER — Encounter: Payer: Self-pay | Admitting: Internal Medicine

## 2011-12-16 VITALS — BP 120/70 | HR 72 | Ht 62.0 in | Wt 127.2 lb

## 2011-12-16 DIAGNOSIS — R1319 Other dysphagia: Secondary | ICD-10-CM

## 2011-12-16 DIAGNOSIS — R079 Chest pain, unspecified: Secondary | ICD-10-CM

## 2011-12-16 MED ORDER — SOD PHOS MONO-SOD PHOS DIBASIC 1.102-0.398 G PO TABS: ORAL_TABLET | ORAL | Status: DC

## 2011-12-16 NOTE — Progress Notes (Signed)
Amy Fischer 09/19/1929 MRN [id number]  History of Present Illness:  This is an 77 year old white female who had a recent hospitalization for subxiphoid chest pain. Cardiac causes were ruled out. She has a history of gastroesophageal reflux and recent dysphagia. She was on Fosamax when she developed chest pain at the end of November 2012. She has been on ranitidine 300 mg twice a day and has not had any recurrence of the pain. She had a prior cholecystectomy. She has never had an upper endoscopy. She is due for a recall colonoscopy. There is a history of a hyperplastic polyp of the colon on a colonoscopy in 2005.   Past Medical History  Diagnosis Date  . GERD (gastroesophageal reflux disease)   . Hyperlipidemia   . Hypothyroidism   . Osteoarthritis   . Osteoporosis   . Episodic recurrent vertigo     MRI Head 2003  . Allergy   . Bladder polyps   . Subclavian steal syndrome     bypass 1999  . Hyperplastic colon polyp   . Esophageal spasm   . History of hepatitis     unknown type   Past Surgical History  Procedure Date  . Appendectomy   . Cholecystectomy   . Carotid-subclavian bypass graft 1999    left for [name] steal syndrome  . Rotator cuff repair     left  . Elbow surgery     left  . Cystectomy     left hand  . Tubal ligation   . Tonsillectomy     reports that she has quit smoking. She has never used smokeless tobacco. She reports that she drinks alcohol. She reports that she does not use illicit drugs. family history includes Heart disease in her father; Hypertension in her mother; and Stroke in her mother.  There is no history of Colon cancer. Allergies  Allergen Reactions  . Codeine Nausea And Vomiting  . Risedronate Sodium     unknown  . Statins     Muscles hurt         Review of Systems:Intermittent dysphagia. History of regurgitation and acid reflux. History of intolerance to PPIs  The remainder of the 10 point ROS is negative except as outlined in  H&P   Physical Exam: General appearance  Well developed, in no distress. Eyes- non icteric. HEENT nontraumatic, normocephalic. Mouth no lesions, tongue papillated, no cheilosis. Neck supple without adenopathy, thyroid not enlarged, no carotid bruits, no JVD. Lungs Clear to auscultation bilaterally. Cor normal S1, normal S2, regular rhythm, no murmur,  quiet precordium. Abdomen: Soft, nontender. I cannot reproduce her abdominal pain. Normal active bowel sounds.  Rectal: Extremities no pedal edema. Skin no lesions. Neurological alert and oriented x 3. Psychological normal mood and affect.  Assessment and Plan:  Problem #1 Chronic gastroesophageal reflux disease with recent hospitalizations for noncardiac chest pain. We need to rule out a hiatal hernia. We also need to rule out an esophageal stricture or Barrett's esophagus. We will proceed with an upper endoscopy. She will continue ranitidine 300 mg twice a day and anti-reflex measures.   Problem #2 Patient's last colonoscopy in 2005 showed a hyperplastic polyp. She is due for a recall. We will schedule this to occur at the time of the endoscopy.     12/16/2011 Lina Sar

## 2011-12-16 NOTE — Patient Instructions (Addendum)
You have been scheduled for an endoscopy and colonoscopy. Please follow the written instructions given to you at your visit today. Please pick up yourprep at the pharmacy within the next 2-3 days. Patient has been advised of possible complications from using osmoprep and has agreed to proceed with the prep. She has signed our consent sheet for this. CC: Dr Fabian Sharp

## 2011-12-17 ENCOUNTER — Encounter: Payer: Self-pay | Admitting: Internal Medicine

## 2012-01-02 ENCOUNTER — Ambulatory Visit: Admitting: Internal Medicine

## 2012-01-06 ENCOUNTER — Ambulatory Visit (INDEPENDENT_AMBULATORY_CARE_PROVIDER_SITE_OTHER): Payer: Medicare Other | Admitting: Neurology

## 2012-01-06 ENCOUNTER — Encounter: Payer: Self-pay | Admitting: Neurology

## 2012-01-06 DIAGNOSIS — R42 Dizziness and giddiness: Secondary | ICD-10-CM | POA: Diagnosis not present

## 2012-01-06 DIAGNOSIS — R269 Unspecified abnormalities of gait and mobility: Secondary | ICD-10-CM

## 2012-01-06 NOTE — Patient Instructions (Addendum)
Your CTA is scheduled at [facility] radiology department on Thursday, January 10th at 11:00 am. You need to arrive 15 minutes prior to your test. Nothing to eat or drink after 7 am that day. [phone].

## 2012-01-06 NOTE — Progress Notes (Signed)
Dear Dr. Fabian Sharp,  Thank you for having me see Amy Fischer in consultation today at [facility] for her problem with intermittent dizziness.  As you may recall, she is a 78 y.o. year old female with a history of a left subclavian steal s/p stenting who has a long history of brief vertigo when she turns her head.  This came to your heightened attention when she had atypical chest pain in November and had two brief spells of her typical vertigo when she turned her head.  She has not had any symptoms since.  She has had a multiyear history of vertigo, described as brief, only when standing, when she turns her head.  It last seconds and is not accompanied by diplopia or dysarthria.  It only comes infrequently, 2-3 times per year.  She does comment that she had diplopia in the past and this lead to her seeing the neurologist Dr. Moises Blood in 1999 who discovered her left subclavian steal and referred her to Dr. [name], CV surgeon to have it stented.  She has not had diplopia since then.  She had a brief period several years ago with problems with her vision, and resaw Dr. [name], and apparently an "ultrasound" was unremarkable.  She has been on aspirin 325mg  since her stent placement.  Reviewing her records she had had a MRI brain in 2003 for her vertigo which was unremarkable at that time.  She does have baseline problems with her balance.  Past Medical History  Diagnosis Date  . GERD (gastroesophageal reflux disease)   . Hyperlipidemia   . Hypothyroidism   . Osteoarthritis   . Osteoporosis   . Episodic recurrent vertigo     MRI Head 2003  . Allergy   . Bladder polyps   . Subclavian steal syndrome     bypass 1999  . Hyperplastic colon polyp   . Esophageal spasm   . History of hepatitis     unknown type    Past Surgical History  Procedure Date  . Appendectomy   . Cholecystectomy   . Carotid-subclavian bypass graft 1999    left for [place] steal syndrome  . Rotator cuff repair    left  . Elbow surgery     left  . Cystectomy     left hand  . Tubal ligation   . Tonsillectomy     History   Social History  . Marital Status: Widowed    Spouse Name: N/A    Number of Children: 4  . Years of Education: N/A   Occupational History  . retired    Social History Main Topics  . Smoking status: Former Smoker    Quit date: 01/05/1991  . Smokeless tobacco: Never Used  . Alcohol Use: Yes     socially  . Drug Use: No  . Sexually Active: None   Other Topics Concern  . None   Social History Narrative   WidowedHH of 1 No petsFormer smokerExercises regularly    Family History  Problem Relation Age of Onset  . Hypertension Mother   . Heart disease Father   . Stroke Mother   . Colon cancer Neg Hx     Current Outpatient Prescriptions on File Prior to Visit  Medication Sig Dispense Refill  . aspirin 325 MG tablet Take 325 mg by mouth daily.        . calcium-vitamin D (OSCAL WITH D) 500-200 MG-UNIT per tablet Take 1 tablet by mouth daily.        [name]  Cholecalciferol (VITAMIN D3) 1000 UNITS CAPS Take 1 capsule by mouth daily.       . diphenhydrAMINE (SOMINEX) 25 MG tablet Take 25 mg by mouth at bedtime as needed. For allergies and sleep      . docusate sodium (COLACE) 100 MG capsule Take 100 mg by mouth at bedtime.       [name] estropipate (OGEN) 0.75 MG tablet Take 0.75 mg by mouth 3 (three) times a week. Take on Monday, Wednesday and Friday days 1-25      . ezetimibe (ZETIA) 10 MG tablet Take 1 tablet (10 mg total) by mouth daily.  90 tablet  3  . fish oil-omega-3 fatty acids 1000 MG capsule Take 1 g by mouth daily.       [name] glucosamine-chondroitin 500-400 MG tablet Take 1 tablet by mouth daily.       [name] levothyroxine (SYNTHROID) 88 MCG tablet Take 1 tablet (88 mcg total) by mouth daily.  90 tablet  1  . medroxyPROGESTERone (PROVERA) 5 MG tablet Take 5 mg by mouth 3 (three) times a week. Take on Monday, Wednesday and Friday of days 13-25      . Melatonin 3 MG TABS Take 1  tablet by mouth every other day.       . multivitamin (THERAGRAN) per tablet Take 1 tablet by mouth daily.        . nitroGLYCERIN (NITROSTAT) 0.4 MG SL tablet Place 1 tablet (0.4 mg total) under the tongue every 5 (five) minutes as needed for chest pain.  30 tablet  1  . ranitidine (ZANTAC) 300 MG capsule Take 1 capsule (300 mg total) by mouth 2 (two) times daily.  180 capsule  3  . rosuvastatin (CRESTOR) 5 MG tablet Take 2.5 mg by mouth daily.        . sodium phosphates (OSMOPREP) 1.102-0.398 G TABS Take as directed on prep instructions  32 tablet  0    Allergies  Allergen Reactions  . Codeine Nausea And Vomiting  . Risedronate Sodium     unknown  . Statins     Muscles hurt       ROS:  13 systems were reviewed and are notable for difficulty with balance and arthritis.  All other review of systems are unremarkable.   Examination:  Filed Vitals:   01/06/12 0821  BP: 122/80  Pulse: 76  Weight: 127 lb (57.607 kg)   no difference between arms, pulses strong in both wrists  In general, well appearing older women.  Cardiovascular: The patient has a regular rate and rhythm and no carotid bruits.  Fundoscopy:  Disks are flat. Vessel caliber within normal limits.  Mental status:   The patient is oriented to person, place and time. Recent and remote memory are intact. Attention span and concentration are normal. Language including repetition, naming, following commands are intact. Fund of knowledge of current and historical events, as well as vocabulary are normal.  Cranial Nerves: Pupils are equally round and reactive to light. Visual fields full to confrontation. EOMs appear full versions.  However, she has rotatory left ward beating nystagmus when looking to the left.  She has upbeating nystagmus when looking upwards.  No downbeating nystagmus.  DixHallpike induces rightward beating nystagmus with no latency when right ear is down.  Facial sensation and muscles of mastication are  intact. Muscles of facial expression are symmetric. Hearing intact to bilateral finger rub. Tongue protrusion, uvula, palate midline.  Shoulder shrug intact  Motor:  The patient has  normal bulk and tone, no pronator drift.  There are no adventitious movements. 5/5 bilaterally.  Reflexes:   Biceps  Triceps Brachioradialis Knee Ankle  Right 2+  2+  2+   2+ 2+  Left  2+  2+  2+   2+ 2+  Toes down  Coordination:  Normal finger to nose.    Sensation is intact to vibration bilaterally.  Gait and Station are normal.    Romberg is positive.   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  However, likely old given the history.  However, given the history of subclavian steal s/p stent  it makes sense to image her CTA head and neck to make sure her posterior circulation is ok.  She will continue the aspirin 325mg  daily.  It is possible that during the bouts of double vision when she had her subclavian steal she had ischemia to her brain stem that has left her with vestibular dysfunction.    If her CTA looks ok she does not need to come back and see [place].  However, if she has interval worsening of her vertigo then she should return to see [place] immediately.  Thank you for having [place] see Amy Fischer in consultation.  Feel free to contact me with any questions.  Lupita Raider Modesto Charon, MD [facility], [place] [address] [place], [place] [address] Phone: [phone] Fax: [phone].

## 2012-01-07 ENCOUNTER — Ambulatory Visit (HOSPITAL_COMMUNITY)
Admission: RE | Admit: 2012-01-07 | Discharge: 2012-01-07 | Disposition: A | Discharge status: Home / Self Care | Payer: Medicare Other | Source: Ambulatory Visit | Attending: Neurology | Admitting: Neurology

## 2012-01-07 ENCOUNTER — Encounter (HOSPITAL_COMMUNITY): Payer: Self-pay

## 2012-01-07 DIAGNOSIS — I771 Stricture of artery: Secondary | ICD-10-CM | POA: Diagnosis not present

## 2012-01-07 DIAGNOSIS — G319 Degenerative disease of nervous system, unspecified: Secondary | ICD-10-CM | POA: Insufficient documentation

## 2012-01-07 DIAGNOSIS — R269 Unspecified abnormalities of gait and mobility: Secondary | ICD-10-CM | POA: Diagnosis not present

## 2012-01-07 DIAGNOSIS — H538 Other visual disturbances: Secondary | ICD-10-CM | POA: Insufficient documentation

## 2012-01-07 DIAGNOSIS — I6789 Other cerebrovascular disease: Secondary | ICD-10-CM | POA: Insufficient documentation

## 2012-01-07 DIAGNOSIS — R42 Dizziness and giddiness: Secondary | ICD-10-CM | POA: Insufficient documentation

## 2012-01-07 DIAGNOSIS — I6529 Occlusion and stenosis of unspecified carotid artery: Secondary | ICD-10-CM | POA: Insufficient documentation

## 2012-01-07 MED ORDER — IOHEXOL 350 MG/ML SOLN
50.0000 mL | Freq: Once | INTRAVENOUS | Status: AC | PRN
  Administered 2012-01-07: 50 mL via INTRAVENOUS

## 2012-01-08 ENCOUNTER — Other Ambulatory Visit (HOSPITAL_COMMUNITY): Payer: Medicare Other

## 2012-01-08 ENCOUNTER — Encounter: Payer: Self-pay | Admitting: Internal Medicine

## 2012-01-08 ENCOUNTER — Ambulatory Visit (INDEPENDENT_AMBULATORY_CARE_PROVIDER_SITE_OTHER): Payer: Medicare Other | Admitting: Internal Medicine

## 2012-01-08 VITALS — BP 122/80 | HR 78 | Wt 129.0 lb

## 2012-01-08 DIAGNOSIS — M1990 Unspecified osteoarthritis, unspecified site: Secondary | ICD-10-CM | POA: Diagnosis not present

## 2012-01-08 DIAGNOSIS — R0789 Other chest pain: Secondary | ICD-10-CM

## 2012-01-08 DIAGNOSIS — R030 Elevated blood-pressure reading, without diagnosis of hypertension: Secondary | ICD-10-CM

## 2012-01-08 DIAGNOSIS — R42 Dizziness and giddiness: Secondary | ICD-10-CM | POA: Diagnosis not present

## 2012-01-08 DIAGNOSIS — M810 Age-related osteoporosis without current pathological fracture: Secondary | ICD-10-CM

## 2012-01-08 NOTE — Patient Instructions (Signed)
check your blood pressure readings a few times per month and contact  us if continually 150 and over range  Or other concern.   ROV  In 4 months

## 2012-01-08 NOTE — Progress Notes (Signed)
  Subjective:    Patient ID: Amy Fischer, female    DOB: 1929-02-25, 78 y.o.   MRN: [id number]  HPI Pt comesin for fu of a few issues. This includes elevated blood pressure readings. Since her last visit she has been tracking her blood pressures a couple times a month and only once was at 150 range the others were in the 1:30 range occasionally 140 . Since last visit has seen Dr Modesto Charon for her dizziness and neurologic evaluation which included CAT scan and MRA. Question central causes but no anatomic changes. She is also seeing Dr. Juanda Chance  about her chest pain and she is going to have an endoscopy as well as a colonoscopy (that was due because of colon polyps) this was assumed to be  Esophageal cause of pain.  Still off of the bishphosphonate  No new cp.    Review of Systems Neg current cp syncope OA no change  vison hard to focus at times ? From MRA CT dye no  Other changes . No falling  And no change or new vertigo eipsodes.   Past history family history social history reviewed in the electronic medical record.     Objective:   Physical Exam WDWN in and looks well  OA changes  HEENT eoms full gait  Steady today  Repeat BP readings 140/70 right 134/70 left sitting pulses intact   No clubbing cyanosis or edema Reviewed  ehr   And briefly  the mra head and neck.       Assessment & Plan:  Elevated bp readings but ok now ... Echo in hosp showed mild lvh but normal lvf at this point would not add medication  For risk  of hypotension. Over all has 40 % Carotid  disease left  On scan . Atherosclerosis noted  Intolerant of statins Vision issues / cause   See  To have eye check soon   Dizziness gait issue  Sp eval per neuro   Atypical chest pain  Poss esophageal  Due for endoscopy colonoscopy  Continue to monitor and fu 4 months  Or as needed.  Osteoporosis  Bisphosphonate treatments on hold.

## 2012-01-09 ENCOUNTER — Encounter: Payer: Self-pay | Admitting: Neurology

## 2012-01-12 ENCOUNTER — Telehealth: Payer: Self-pay | Admitting: Neurology

## 2012-01-12 NOTE — Telephone Encounter (Signed)
Dr. Rosezella [place] office called to let you know that Dr. Fabian Sharp is out of the office for the remainder of the week. She will return on Tuesday, 01-20-2012. Your message regarding pt Amy Fischer is sitting in her inbox waiting for her return. Her office wanted to keep you informed.

## 2012-01-16 ENCOUNTER — Ambulatory Visit (AMBULATORY_SURGERY_CENTER): Payer: Medicare Other | Admitting: Internal Medicine

## 2012-01-16 ENCOUNTER — Encounter: Payer: Self-pay | Admitting: Internal Medicine

## 2012-01-16 DIAGNOSIS — R1319 Other dysphagia: Secondary | ICD-10-CM

## 2012-01-16 DIAGNOSIS — Z1211 Encounter for screening for malignant neoplasm of colon: Secondary | ICD-10-CM | POA: Diagnosis not present

## 2012-01-16 DIAGNOSIS — D126 Benign neoplasm of colon, unspecified: Secondary | ICD-10-CM

## 2012-01-16 DIAGNOSIS — K2970 Gastritis, unspecified, without bleeding: Secondary | ICD-10-CM

## 2012-01-16 DIAGNOSIS — R0789 Other chest pain: Secondary | ICD-10-CM

## 2012-01-16 DIAGNOSIS — K2990 Gastroduodenitis, unspecified, without bleeding: Secondary | ICD-10-CM | POA: Diagnosis not present

## 2012-01-16 DIAGNOSIS — R079 Chest pain, unspecified: Secondary | ICD-10-CM

## 2012-01-16 MED ORDER — SODIUM CHLORIDE 0.9 % IV SOLN: 500.0000 mL | INTRAVENOUS | Status: DC

## 2012-01-16 NOTE — Patient Instructions (Addendum)
FOLLOW INSTRUCTIONS ON THE GREEN AND BLUE INSTRUCTION SHEETS.   CONTINUE YOUR MEDICATIONS. CONTINUE ZANTAC 300 MG DAILY.  HIGH FIBER DIET.

## 2012-01-16 NOTE — Op Note (Signed)
[facility] [address] [place], [place]  [address]  COLONOSCOPY PROCEDURE REPORT  PATIENT:  Amy Fischer, Amy Fischer  MR#:  [id number] BIRTHDATE:  12-18-1929, 82 yrs. old  GENDER:  female ENDOSCOPIST:  Hedwig Morton. Juanda Chance, MD REF. BY:  Neta Mends. Panosh, M.D. PROCEDURE DATE:  01/16/2012 PROCEDURE:  Colonoscopy with biopsy ASA CLASS:  Class II INDICATIONS:  colorectal cancer screening, average risk last colon 03/2004 - hyperplastic polyp MEDICATIONS:   These medications were titrated to patient response per physician's verbal order, Versed 5 mg, Fentanyl 50 mcg  DESCRIPTION OF PROCEDURE:   After the risks and benefits and of the procedure were explained, informed consent was obtained. Digital rectal exam was performed and revealed no rectal masses. The LB [id number] endoscope was introduced through the anus and advanced to the cecum, which was identified by both the appendix and ileocecal valve.  The quality of the prep was good, using MoviPrep.  The instrument was then slowly withdrawn as the colon was fully examined. <<PROCEDUREIMAGES>>  FINDINGS:  A diminutive polyp was found. 3 mm polyp at 60 cm The polyp was removed using cold biopsy forceps (see image5).  Mild diverticulosis was found in the sigmoid colon (see image6 and image1).  This was otherwise a normal examination of the colon (see image3, image4, and image7).   Retroflexed views in the rectum revealed no abnormalities.    The scope was then withdrawn from the patient and the procedure completed.  COMPLICATIONS:  None ENDOSCOPIC IMPRESSION: 1) Diminutive polyp 2) Mild diverticulosis in the sigmoid colon 3) Otherwise normal examination RECOMMENDATIONS: 1) Await biopsy results 2) High fiber diet.  REPEAT EXAM:  In 10 year(s) for.  ______________________________ Hedwig Morton. Juanda Chance, MD  CC:  n. eSIGNED:   Hedwig Morton. Amy Fischer at 01/16/2012 03:38 PM  [place], Kathie Rhodes, [phone]

## 2012-01-16 NOTE — Progress Notes (Signed)
Patient did not experience any of the following events: a burn prior to discharge; a fall within the facility; wrong site/side/patient/procedure/implant event; or a hospital transfer or hospital admission upon discharge from the facility. (G8907) Patient did not have preoperative order for IV antibiotic SSI prophylaxis. (G8918)  

## 2012-01-16 NOTE — Op Note (Signed)
[facility] [address] [place], [place]  [address]  ENDOSCOPY PROCEDURE REPORT  PATIENT:  Amy Fischer, Amy Fischer  MR#:  [id number] BIRTHDATE:  02-19-1929, 82 yrs. old  GENDER:  female  ENDOSCOPIST:  Hedwig Morton. Juanda Chance, MD Referred by:  Neta Mends. Panosh, M.D.  PROCEDURE DATE:  01/16/2012 PROCEDURE:  EGD with biopsy, 43239 ASA CLASS:  Class II INDICATIONS:  chest pain, GERD  MEDICATIONS:   These medications were titrated to patient response per physician's verbal order, Versed 5 mg, Fentanyl 50 mcg TOPICAL ANESTHETIC:  Cetacaine Spray  DESCRIPTION OF PROCEDURE:   After the risks benefits and alternatives of the procedure were thoroughly explained, informed consent was obtained.  The LB GIF-H180 [id number] endoscope was introduced through the mouth and advanced to the second portion of the duodenum, without limitations.  The instrument was slowly withdrawn as the mucosa was fully examined. <<PROCEDUREIMAGES>>  Mild gastritis was found. patchies and streaks of erythema gastric antrum With standard forceps, a biopsy was obtained and sent to pathology (see image1 and image5).  Otherwise the examination was normal (see image7, image6, image4, image3, and image2). Retroflexed views revealed no abnormalities.    The scope was then withdrawn from the patient and the procedure completed.  COMPLICATIONS:  None  ENDOSCOPIC IMPRESSION: 1) Mild gastritis 2) Otherwise normal examination no esophageal stricture or hiatal hernia RECOMMENDATIONS: 1) Await biopsy results continue Zantac 300mg  po qd  REPEAT EXAM:  In 0 year(s) for.  ______________________________ Hedwig Morton. Juanda Chance, MD  CC:  n. eSIGNED:   Hedwig Morton. Yola Paradiso at 01/16/2012 03:33 PM  Terra, Kathie Rhodes, [phone]

## 2012-01-19 ENCOUNTER — Telehealth: Payer: Self-pay | Admitting: *Deleted

## 2012-01-19 NOTE — Telephone Encounter (Signed)
Left message to call office if questions or concerns. 

## 2012-01-19 NOTE — Telephone Encounter (Signed)
No answer left message.

## 2012-01-20 ENCOUNTER — Encounter: Payer: Self-pay | Admitting: Internal Medicine

## 2012-01-20 ENCOUNTER — Encounter: Payer: Self-pay | Admitting: *Deleted

## 2012-01-22 ENCOUNTER — Encounter: Payer: Self-pay | Admitting: *Deleted

## 2012-01-29 NOTE — Progress Notes (Signed)
Quick Note:  Left message to call back. ______ 

## 2012-01-30 NOTE — Progress Notes (Signed)
Quick Note:  Pt aware of this. ______ 

## 2012-03-18 ENCOUNTER — Encounter (HOSPITAL_BASED_OUTPATIENT_CLINIC_OR_DEPARTMENT_OTHER): Payer: Self-pay | Admitting: *Deleted

## 2012-03-18 ENCOUNTER — Emergency Department (INDEPENDENT_AMBULATORY_CARE_PROVIDER_SITE_OTHER): Payer: Medicare Other

## 2012-03-18 ENCOUNTER — Emergency Department (HOSPITAL_BASED_OUTPATIENT_CLINIC_OR_DEPARTMENT_OTHER)
Admission: EM | Admit: 2012-03-18 | Discharge: 2012-03-19 | Disposition: A | Discharge status: Home / Self Care | Payer: Medicare Other | Attending: Emergency Medicine | Admitting: Emergency Medicine

## 2012-03-18 DIAGNOSIS — W19XXXA Unspecified fall, initial encounter: Secondary | ICD-10-CM

## 2012-03-18 DIAGNOSIS — E785 Hyperlipidemia, unspecified: Secondary | ICD-10-CM | POA: Insufficient documentation

## 2012-03-18 DIAGNOSIS — S46909A Unspecified injury of unspecified muscle, fascia and tendon at shoulder and upper arm level, unspecified arm, initial encounter: Secondary | ICD-10-CM

## 2012-03-18 DIAGNOSIS — S42102A Fracture of unspecified part of scapula, left shoulder, initial encounter for closed fracture: Secondary | ICD-10-CM

## 2012-03-18 DIAGNOSIS — S4980XA Other specified injuries of shoulder and upper arm, unspecified arm, initial encounter: Secondary | ICD-10-CM

## 2012-03-18 DIAGNOSIS — S42109A Fracture of unspecified part of scapula, unspecified shoulder, initial encounter for closed fracture: Secondary | ICD-10-CM | POA: Diagnosis not present

## 2012-03-18 DIAGNOSIS — W1809XA Striking against other object with subsequent fall, initial encounter: Secondary | ICD-10-CM | POA: Insufficient documentation

## 2012-03-18 MED ORDER — ACETAMINOPHEN 325 MG PO TABS
650.0000 mg | ORAL_TABLET | Freq: Once | ORAL | Status: AC
  Administered 2012-03-18: 650 mg via ORAL
  Filled 2012-03-18: qty 2

## 2012-03-18 NOTE — ED Notes (Signed)
Patient transported to X-ray 

## 2012-03-18 NOTE — ED Notes (Signed)
Pt reports she fell backwards and hit left upper arm on a wooden chest after getting tangled in oxygen tubing- denies loc- guarding left upper arm

## 2012-03-18 NOTE — ED Provider Notes (Signed)
History     CSN: [id number]  Arrival date & time 03/18/12  2114   None     Chief Complaint  Patient presents with  . Arm Injury  . Fall    (Consider location/radiation/quality/duration/timing/severity/associated sxs/prior treatment) HPI Patient tripped 8:30 PM tonight striking her left scapular area against a piece of furniture. No other injury no treatment prior to coming here pain is worse with moving her left arm improved with remaining still no other complaint no other associated symptoms. Pain is moderate in severity sharp in quality Past Medical History  Diagnosis Date  . GERD (gastroesophageal reflux disease)   . Hyperlipidemia   . Hypothyroidism   . Osteoarthritis   . Osteoporosis   . Episodic recurrent vertigo     MRI Head 2003  . Allergy   . Bladder polyps   . Subclavian steal syndrome     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; CTA 2013 revealed open graft  . Hyperplastic colon polyp   . Esophageal spasm   . History of hepatitis     unknown type  . Asymptomatic carotid artery stenosis     R ICA 40% stenosis on CTA 12/2011.  [name] Anemia   . Cataract     BILATERAL-REMOVED    Past Surgical History  Procedure Date  . Appendectomy   . Cholecystectomy   . Carotid-subclavian bypass graft 1999    left for [place] steal syndrome  . Rotator cuff repair     left  . Elbow surgery     left  . Cystectomy     left hand  . Tubal ligation   . Tonsillectomy   . Colonoscopy     Family History  Problem Relation Age of Onset  . Hypertension Mother   . Heart disease Father   . Stroke Mother   . Colon cancer Neg Hx     History  Substance Use Topics  . Smoking status: Former Smoker    Quit date: 01/05/1991  . Smokeless tobacco: Never Used  . Alcohol Use: 2.4 oz/week    4 Glasses of wine per week     socially    OB History    Grav Para Term Preterm Abortions TAB SAB Ect Mult Living                  Review of Systems  Constitutional: Negative.     HENT: Negative.   Respiratory: Negative.   Cardiovascular: Negative.   Gastrointestinal: Negative.   Musculoskeletal: Positive for arthralgias.       Pain left scapular area  Skin: Negative.   Neurological: Negative.   Hematological: Negative.   Psychiatric/Behavioral: Negative.   All other systems reviewed and are negative.    Allergies  Codeine; Risedronate sodium; and Statins  Home Medications   Current Outpatient Rx  Name Route Sig Dispense Refill  . ASPIRIN 325 MG PO TABS Oral Take 325 mg by mouth daily.      [name] CALCIUM CARBONATE-VITAMIN D 500-200 MG-UNIT PO TABS Oral Take 1 tablet by mouth daily.      [name] VITAMIN D3 1000 UNITS PO CAPS Oral Take 1 capsule by mouth daily.     [name] DIPHENHYDRAMINE HCL (SLEEP) 25 MG PO TABS Oral Take 25 mg by mouth at bedtime as needed. For allergies and sleep    . DOCUSATE SODIUM 100 MG PO CAPS Oral Take 100 mg by mouth at bedtime.     . ESTROPIPATE 0.75 MG PO TABS Oral  Take 0.75 mg by mouth 3 (three) times a week. Take on Monday, Wednesday and Friday days 1-25    . EZETIMIBE 10 MG PO TABS Oral Take 1 tablet (10 mg total) by mouth daily. 90 tablet 3  . OMEGA-3 FATTY ACIDS 1000 MG PO CAPS Oral Take 1 g by mouth daily.     [name] GLUCOSAMINE-CHONDROITIN 500-400 MG PO TABS Oral Take 1 tablet by mouth daily.     . IBUPROFEN 200 MG PO TABS Oral Take 200 mg by mouth every 6 (six) hours as needed. Patient used this medication for a headache.    [name] LEVOTHYROXINE SODIUM 88 MCG PO TABS Oral Take 1 tablet (88 mcg total) by mouth daily. 90 tablet 1  . MEDROXYPROGESTERONE ACETATE 5 MG PO TABS Oral Take 5 mg by mouth 3 (three) times a week. Take on Monday, Wednesday and Friday of days 13-25    . MELATONIN 3 MG PO TABS Oral Take 1 tablet by mouth every other day.     . MULTIVITAMINS PO TABS Oral Take 1 tablet by mouth daily.      [name] RANITIDINE HCL 300 MG PO CAPS Oral Take 1 capsule (300 mg total) by mouth 2 (two) times daily. 180 capsule 3  . ROSUVASTATIN CALCIUM 5 MG  PO TABS Oral Take 2.5 mg by mouth daily.      [name] NITROGLYCERIN 0.4 MG SL SUBL Sublingual Place 1 tablet (0.4 mg total) under the tongue every 5 (five) minutes as needed for chest pain. 30 tablet 1    BP 148/62  Pulse 72  Temp(Src) 97.9 F (36.6 C) (Oral)  Resp 20  SpO2 95%  Physical Exam  Nursing note and vitals reviewed. Constitutional: She appears well-developed and well-nourished.  HENT:  Head: Normocephalic and atraumatic.  Eyes: Conjunctivae are normal. Pupils are equal, round, and reactive to light.  Neck: Neck supple. No tracheal deviation present. No thyromegaly present.  Cardiovascular: Normal rate and regular rhythm.   No murmur heard. Pulmonary/Chest: Effort normal and breath sounds normal.  Abdominal: Soft. Bowel sounds are normal. She exhibits no distension. There is no tenderness.  Musculoskeletal: Normal range of motion. She exhibits no edema and no tenderness.       Tender over left scapula pain is exacerbated by active motion of left shoulder no deformity no swelling no ecchymosis  Neurological: She is alert. Coordination normal.  Skin: Skin is warm and dry. No rash noted.  Psychiatric: She has a normal mood and affect.    ED Course  Procedures (including critical care time)  Labs Reviewed - No data to display Dg Humerus Left  03/18/2012  *RADIOLOGY REPORT*  Clinical Data: Arm injury after fall  LEFT HUMERUS - 2+ VIEW  Comparison: None  Findings: There is no evidence of fracture or dislocation.  There is no evidence of arthropathy or other focal bone abnormality. Soft tissues are unremarkable.  IMPRESSION: No acute bony abnormality.  Original Report Authenticated By: Rosealee Albee, M.D.     No diagnosis found.  Results for orders placed during the hospital encounter of 11/21/11  CBC      Component Value Range   WBC 7.4  4.0 - 10.5 (K/uL)   RBC 4.36  3.87 - 5.11 (MIL/uL)   Hemoglobin 13.9  12.0 - 15.0 (g/dL)   HCT 16.1  09.6 - 04.5 (%)   MCV 93.6  78.0  - 100.0 (fL)   MCH 31.9  26.0 - 34.0 (pg)   MCHC 34.1  30.0 - 36.0 (g/dL)   RDW 16.1  09.6 - 04.5 (%)   Platelets 228  150 - 400 (K/uL)  DIFFERENTIAL      Component Value Range   Neutrophils Relative 64  43 - 77 (%)   Neutro Abs 4.7  1.7 - 7.7 (K/uL)   Lymphocytes Relative 22  12 - 46 (%)   Lymphs Abs 1.6  0.7 - 4.0 (K/uL)   Monocytes Relative 10  3 - 12 (%)   Monocytes Absolute 0.8  0.1 - 1.0 (K/uL)   Eosinophils Relative 3  0 - 5 (%)   Eosinophils Absolute 0.2  0.0 - 0.7 (K/uL)   Basophils Relative 1  0 - 1 (%)   Basophils Absolute 0.0  0.0 - 0.1 (K/uL)  COMPREHENSIVE METABOLIC PANEL      Component Value Range   Sodium 140  135 - 145 (mEq/L)   Potassium 4.0  3.5 - 5.1 (mEq/L)   Chloride 107  96 - 112 (mEq/L)   CO2 23  19 - 32 (mEq/L)   Glucose, Bld 103 (*) 70 - 99 (mg/dL)   BUN 19  6 - 23 (mg/dL)   Creatinine, Ser 4.09  0.50 - 1.10 (mg/dL)   Calcium 81.1  8.4 - 10.5 (mg/dL)   Total Protein 7.2  6.0 - 8.3 (g/dL)   Albumin 3.8  3.5 - 5.2 (g/dL)   AST 21  0 - 37 (U/L)   ALT 16  0 - 35 (U/L)   Alkaline Phosphatase 70  39 - 117 (U/L)   Total Bilirubin 0.3  0.3 - 1.2 (mg/dL)   GFR calc non Af Amer 80 (*) >90 (mL/min)   GFR calc Af Amer >90  >90 (mL/min)  LIPASE, BLOOD      Component Value Range   Lipase 33  11 - 59 (U/L)  CARDIAC PANEL(CRET KIN+CKTOT+MB+TROPI)      Component Value Range   Total CK 94  7 - 177 (U/L)   CK, MB 4.3 (*) 0.3 - 4.0 (ng/mL)   Troponin I <0.30  <0.30 (ng/mL)   Relative Index RELATIVE INDEX IS INVALID  0.0 - 2.5   PRO B NATRIURETIC PEPTIDE      Component Value Range   Pro B Natriuretic peptide (BNP) 326.8  0 - 450 (pg/mL)  MRSA PCR SCREENING      Component Value Range   MRSA by PCR NEGATIVE  NEGATIVE   CARDIAC PANEL(CRET KIN+CKTOT+MB+TROPI)      Component Value Range   Total CK 75  7 - 177 (U/L)   CK, MB 3.4  0.3 - 4.0 (ng/mL)   Troponin I <0.30  <0.30 (ng/mL)   Relative Index RELATIVE INDEX IS INVALID  0.0 - 2.5   CARDIAC PANEL(CRET  KIN+CKTOT+MB+TROPI)      Component Value Range   Total CK 72  7 - 177 (U/L)   CK, MB 3.5  0.3 - 4.0 (ng/mL)   Troponin I <0.30  <0.30 (ng/mL)   Relative Index RELATIVE INDEX IS INVALID  0.0 - 2.5   CARDIAC PANEL(CRET KIN+CKTOT+MB+TROPI)      Component Value Range   Total CK 54  7 - 177 (U/L)   CK, MB 2.9  0.3 - 4.0 (ng/mL)   Troponin I <0.30  <0.30 (ng/mL)   Relative Index RELATIVE INDEX IS INVALID  0.0 - 2.5   BASIC METABOLIC PANEL      Component Value Range   Sodium 143  135 - 145 (mEq/L)   Potassium  4.1  3.5 - 5.1 (mEq/L)   Chloride 109  96 - 112 (mEq/L)   CO2 24  19 - 32 (mEq/L)   Glucose, Bld 90  70 - 99 (mg/dL)   BUN 21  6 - 23 (mg/dL)   Creatinine, Ser 9.14  0.50 - 1.10 (mg/dL)   Calcium 9.5  8.4 - 78.2 (mg/dL)   GFR calc non Af Amer 68 (*) >90 (mL/min)   GFR calc Af Amer 79 (*) >90 (mL/min)  CBC      Component Value Range   WBC 4.8  4.0 - 10.5 (K/uL)   RBC 3.96  3.87 - 5.11 (MIL/uL)   Hemoglobin 12.6  12.0 - 15.0 (g/dL)   HCT 95.6  21.3 - 08.6 (%)   MCV 95.5  78.0 - 100.0 (fL)   MCH 31.8  26.0 - 34.0 (pg)   MCHC 33.3  30.0 - 36.0 (g/dL)   RDW 57.8  46.9 - 62.9 (%)   Platelets 200  150 - 400 (K/uL)  MAGNESIUM      Component Value Range   Magnesium 2.1  1.5 - 2.5 (mg/dL)  CBC      Component Value Range   WBC 6.0  4.0 - 10.5 (K/uL)   RBC 4.19  3.87 - 5.11 (MIL/uL)   Hemoglobin 13.5  12.0 - 15.0 (g/dL)   HCT 52.8  41.3 - 24.4 (%)   MCV 94.3  78.0 - 100.0 (fL)   MCH 32.2  26.0 - 34.0 (pg)   MCHC 34.2  30.0 - 36.0 (g/dL)   RDW 01.0  27.2 - 53.6 (%)   Platelets 211  150 - 400 (K/uL)  CREATININE, SERUM      Component Value Range   Creatinine, Ser 0.72  0.50 - 1.10 (mg/dL)   GFR calc non Af Amer 79 (*) >90 (mL/min)   GFR calc Af Amer >90  >90 (mL/min)   Dg Scapula Left  03/19/2012  *RADIOLOGY REPORT*  Clinical Data: Fall.  Injury.  LEFT SCAPULA - 2+ VIEWS  Comparison: None  Findings: There is a mildly displaced fracture involving the inferior angle of the  scapula. Prior surgical resection of the distal clavicle.  IMPRESSION:  1.  Acute fracture involves the inferior angle of the scapula.  Original Report Authenticated By: Rosealee Albee, M.D.   Dg Humerus Left  03/18/2012  *RADIOLOGY REPORT*  Clinical Data: Arm injury after fall  LEFT HUMERUS - 2+ VIEW  Comparison: None  Findings: There is no evidence of fracture or dislocation.  There is no evidence of arthropathy or other focal bone abnormality. Soft tissues are unremarkable.  IMPRESSION: No acute bony abnormality.  Original Report Authenticated By: Rosealee Albee, M.D.     MDM  Patient reports she can take Tylenol without difficulty she had hepatitis 45-50 years ago Patient reports she can take ibuprofen without difficulty Plan ibuprofen or Tylenol as needed for pain Followup with Dr.Panosh as needed Diagnosis #1 fall #2 fracture of left scapula        Doug Sou, MD 03/19/12 [phone]

## 2012-03-19 ENCOUNTER — Telehealth: Payer: Self-pay | Admitting: Internal Medicine

## 2012-03-19 MED ORDER — TRAMADOL HCL 50 MG PO TABS: 50.0000 mg | ORAL_TABLET | Freq: Two times a day (BID) | ORAL | Status: DC

## 2012-03-19 NOTE — Discharge Instructions (Signed)
Take Tylenol or ibuprofen as needed for pain. Call Dr. Fabian Sharp as needed. You may have pain for several weeks

## 2012-03-19 NOTE — Telephone Encounter (Signed)
Pt is aware of Dr Rosezella [place] instructions.  Rx sent to [facility] on [address]  Pt aware of appt on 03/22/12 at 1:15pm.

## 2012-03-19 NOTE — Telephone Encounter (Signed)
Pt fell last night at home and went to ER in [place]. Pt said that xray were done and pt has a broken bone in shoulder blade and that there was nothing that the doctor in hospital could do for it. Pt is in a lot of pain and it req a pain med. Pt said that she can not take anything with codeine in it, due to allergy. Pt req call back from nurse. [facility].

## 2012-03-19 NOTE — Telephone Encounter (Signed)
Spoke to pt and she fell at home last night, and fractured her scapula, but did not give her any pain meds.  Is requesting pain meds as Ibuprofen did not help. Codeine makes her nauseated, and does not want anything related to this.

## 2012-03-19 NOTE — Telephone Encounter (Signed)
Just saw this message.  Needs follow up visit . If cant come in today  Then  Needs ov next week (or fu with orthopedics. )  In the meantime  We can try tramadol 50 mg bid or tid prn disp 15  . This could make her drowsy some .   If not helping can be seen in Saturday clinic in the interim.

## 2012-03-21 ENCOUNTER — Encounter (HOSPITAL_COMMUNITY): Payer: Self-pay | Admitting: *Deleted

## 2012-03-21 ENCOUNTER — Other Ambulatory Visit: Payer: Self-pay

## 2012-03-21 ENCOUNTER — Emergency Department (INDEPENDENT_AMBULATORY_CARE_PROVIDER_SITE_OTHER)
Admission: EM | Admit: 2012-03-21 | Discharge: 2012-03-21 | Disposition: A | Discharge status: Home / Self Care | Payer: Medicare Other | Source: Home / Self Care | Attending: Family Medicine | Admitting: Family Medicine

## 2012-03-21 ENCOUNTER — Emergency Department (INDEPENDENT_AMBULATORY_CARE_PROVIDER_SITE_OTHER): Payer: Medicare Other

## 2012-03-21 DIAGNOSIS — R071 Chest pain on breathing: Secondary | ICD-10-CM

## 2012-03-21 DIAGNOSIS — R079 Chest pain, unspecified: Secondary | ICD-10-CM | POA: Diagnosis not present

## 2012-03-21 DIAGNOSIS — R0789 Other chest pain: Secondary | ICD-10-CM

## 2012-03-21 DIAGNOSIS — J984 Other disorders of lung: Secondary | ICD-10-CM | POA: Diagnosis not present

## 2012-03-21 DIAGNOSIS — R918 Other nonspecific abnormal finding of lung field: Secondary | ICD-10-CM | POA: Diagnosis not present

## 2012-03-21 NOTE — Discharge Instructions (Signed)
There is no evidence of rib fracture or lung injury in your x-rays. Your EKG electrical heart activity test is also normal. To improve your pain from the fracture he sustained I recommend that you take the pain medications in the following way: Ibuprofen Advil she can take 1 by mouth every 8 hours, take it with food to protect her stomach. Previously prescribed Ultram/tramadol combine with acetaminophen/Tylenol 500 mg in can take every 6-8 hours as needed for pain be aware that tramadol can make you drowsy and you need to avoid falls or accidents. Perform incentive spirometry as we discussed to avoid worsening atelectasis or increasing your risk of developing pneumonia. If persistent or worsening pain you need to go to the emergency department where they can perform a CT scan. Otherwise followup with your primary care provider as scheduled tomorrow.

## 2012-03-21 NOTE — ED Provider Notes (Signed)
History     CSN: [id number]  Arrival date & time 03/21/12  1408   First MD Initiated Contact with Patient 03/21/12 1514      Chief Complaint  Patient presents with  . Fall  . Chest Pain    (Consider location/radiation/quality/duration/timing/severity/associated sxs/prior treatment) HPI Comments: Ms. Carradine is a 78 year old female with history of a recent fall 3 days ago. Patient states that her feet got entangled on her friends oxygen cord making her trip and fall against a chest hurting the left side of her back. She was seen in the emergency department and diagnosed with a lower angle scapular fracture and was discharged home on over-the-counter pain medications as patient is allergic to codeine. She has persistent pain in her left flank and left rib cage worsened by deep inspiration. She call her primary care provider's office and had tramadol prescribed. She has only taken 2 pills and reports mild improvement of her pain. She denies dizziness, nausea, vomiting or abdominal pain. No cough or sputum. No shortness of breath.  Patient is a 78 y.o. female presenting with chest pain.  Chest Pain Pertinent negatives for primary symptoms include no fever, no fatigue, no shortness of breath, no cough, no wheezing, no palpitations, no abdominal pain, no nausea, no vomiting and no dizziness.  Pertinent negatives for associated symptoms include no diaphoresis.     Past Medical History  Diagnosis Date  . GERD (gastroesophageal reflux disease)   . Hyperlipidemia   . Hypothyroidism   . Osteoarthritis   . Osteoporosis   . Episodic recurrent vertigo     MRI Head 2003  . Allergy   . Bladder polyps   . Subclavian steal syndrome     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; CTA 2013 revealed open graft  . Hyperplastic colon polyp   . Esophageal spasm   . History of hepatitis     unknown type  . Asymptomatic carotid artery stenosis     R ICA 40% stenosis on CTA 12/2011.  [name] Anemia   .  Cataract     BILATERAL-REMOVED    Past Surgical History  Procedure Date  . Appendectomy   . Cholecystectomy   . Carotid-subclavian bypass graft 1999    left for [place] steal syndrome  . Rotator cuff repair     left  . Elbow surgery     left  . Cystectomy     left hand  . Tubal ligation   . Tonsillectomy   . Colonoscopy     Family History  Problem Relation Age of Onset  . Hypertension Mother   . Heart disease Father   . Stroke Mother   . Colon cancer Neg Hx     History  Substance Use Topics  . Smoking status: Former Smoker    Quit date: 01/05/1991  . Smokeless tobacco: Never Used  . Alcohol Use: 2.4 oz/week    4 Glasses of wine per week     socially    OB History    Grav Para Term Preterm Abortions TAB SAB Ect Mult Living                  Review of Systems  Constitutional: Negative for fever, chills, diaphoresis, appetite change and fatigue.  HENT: Negative for congestion and sore throat.   Respiratory: Negative for cough, chest tightness, shortness of breath, wheezing and stridor.   Cardiovascular: Positive for chest pain. Negative for palpitations and leg swelling.  Gastrointestinal: Negative for  nausea, vomiting, abdominal pain and abdominal distention.  Skin: Negative for rash.  Neurological: Negative for dizziness, syncope, light-headedness and headaches.    Allergies  Codeine; Risedronate sodium; and Statins  Home Medications   Current Outpatient Rx  Name Route Sig Dispense Refill  . ASPIRIN 325 MG PO TABS Oral Take 325 mg by mouth daily.      [name] CALCIUM CARBONATE-VITAMIN D 500-200 MG-UNIT PO TABS Oral Take 1 tablet by mouth daily.      [name] VITAMIN D3 1000 UNITS PO CAPS Oral Take 1 capsule by mouth daily.     [name] DIPHENHYDRAMINE HCL (SLEEP) 25 MG PO TABS Oral Take 25 mg by mouth at bedtime as needed. For allergies and sleep    . DOCUSATE SODIUM 100 MG PO CAPS Oral Take 100 mg by mouth at bedtime.     . ESTROPIPATE 0.75 MG PO TABS Oral Take 0.75 mg by  mouth 3 (three) times a week. Take on Monday, Wednesday and Friday days 1-25    . EZETIMIBE 10 MG PO TABS Oral Take 1 tablet (10 mg total) by mouth daily. 90 tablet 3  . OMEGA-3 FATTY ACIDS 1000 MG PO CAPS Oral Take 1 g by mouth daily.     [name] GLUCOSAMINE-CHONDROITIN 500-400 MG PO TABS Oral Take 1 tablet by mouth daily.     [name] LEVOTHYROXINE SODIUM 88 MCG PO TABS Oral Take 1 tablet (88 mcg total) by mouth daily. 90 tablet 1  . MEDROXYPROGESTERONE ACETATE 5 MG PO TABS Oral Take 5 mg by mouth 3 (three) times a week. Take on Monday, Wednesday and Friday of days 13-25    . MELATONIN 3 MG PO TABS Oral Take 1 tablet by mouth every other day.     . MULTIVITAMINS PO TABS Oral Take 1 tablet by mouth daily.      [name] NITROGLYCERIN 0.4 MG SL SUBL Sublingual Place 1 tablet (0.4 mg total) under the tongue every 5 (five) minutes as needed for chest pain. 30 tablet 1  . RANITIDINE HCL 300 MG PO CAPS Oral Take 1 capsule (300 mg total) by mouth 2 (two) times daily. 180 capsule 3  . ROSUVASTATIN CALCIUM 5 MG PO TABS Oral Take 2.5 mg by mouth daily.      . TRAMADOL HCL 50 MG PO TABS Oral Take 1 tablet (50 mg total) by mouth 2 (two) times daily. 15 tablet 0  . IBUPROFEN 200 MG PO TABS Oral Take 200 mg by mouth every 6 (six) hours as needed. Patient used this medication for a headache.      BP 170/74  Pulse 66  Temp(Src) 98 F (36.7 C) (Oral)  Resp 17  SpO2 94%  Physical Exam  Nursing note and vitals reviewed. Constitutional: She is oriented to person, place, and time. She appears well-developed and well-nourished. No distress.  HENT:  Head: Normocephalic and atraumatic.  Eyes: Pupils are equal, round, and reactive to light.  Neck: Neck supple. No JVD present.  Cardiovascular: Normal rate and regular rhythm.   Pulmonary/Chest: Breath sounds normal. No respiratory distress. She has no wheezes. She has no rales. She exhibits tenderness.       Pain in left lateral lower chest with deep inspiration. No crepitus or  skin hematoma.  There is small skin bruise more posterior lateral to left scapular lower external border. No skin brakes.  Abdominal: Soft. There is no tenderness.  Musculoskeletal: She exhibits no edema.  Neurological: She is alert and oriented to person, place, and  time.    ED Course  Procedures (including critical care time)  Labs Reviewed - No data to display Dg Ribs Unilateral W/chest Left  03/21/2012  *RADIOLOGY REPORT*  Clinical Data: Larey Seat 3 days ago, left lower rib pain.  LEFT RIBS AND CHEST - 3+ VIEW 03/21/2012:  Comparison: No prior rib imaging.  Two-view chest x-ray 11/21/2011.  Findings: Site of maximal pain and tenderness was marked with a small metallic BB.  No left rib fracture identified.  No intrinsic osseous abnormality involving the left ribs.  Calcified granulomata in the left upper lobe and calcified left hilar lymph nodes as noted previously.  New streaky opacities in the lung bases consistent with atelectasis.  Lungs otherwise clear. Cardiac silhouette mildly enlarged but stable.  Thoracic aorta tortuous atherosclerotic, unchanged.  IMPRESSION:  1.  No left rib fractures identified. 2.  Bibasilar atelectasis.  Stable mild cardiomegaly.  No acute cardiopulmonary disease otherwise.  Old granulomatous disease.  Original Report Authenticated By: Arnell Sieving, M.D.     No diagnosis found.    MDM  Chest x-ray negative for rib fractures. Lungs appear stable other than bibasilar atelectasis. My impression is that patient is having insufficient analgesia for her injury. Discussed with her, a pain management plan involving the combination of previously prescribed tramadol with 200 mg ibuprofen and 500 mg Tylenol. She will take ibuprofen every 8 hours as needed and the combination of tramadol with Tylenol every 6 hours as needed. Patient declined narcotic prescription as she has had nausea with codeine in the past. Daughter in law brought her here and is a Teacher, early years/pre.  Patient  was also encouraged to do incentive spirometry as soon as her pain is better controlled to avoid complications as she already has atelectasis. Follow up tomorrow with pcp. Return to ED if worsening or new symptoms.         Sharin Grave, MD 03/22/12 1057

## 2012-03-21 NOTE — ED Notes (Signed)
Hx left scapula fx from fall Thursday (was seen at [facility]).  Has not been wearing sling much per MD instruction.  Has had gradual onset left anterior lower rib pain w/ painful breathing today.  Denies SOB, but states it is painful to take a deep breath.  Has been taking Ultram for pain - last dose @ 1300 without any relief.  Rib cage is not tender to palpation - states it hurts more inside.

## 2012-03-21 NOTE — ED Notes (Signed)
Asked to speak with patient in waiting room.  Patient states she was seen at [facility] Thursday and has a fractured scapula.  Patient states she woke up this am with left side pain and SOB.  Patient states it hurts to take a deep breath in.  Patient speaking in complete sentences without difficulty.  Patient has family member with her

## 2012-03-22 ENCOUNTER — Encounter: Payer: Self-pay | Admitting: Internal Medicine

## 2012-03-22 ENCOUNTER — Ambulatory Visit (INDEPENDENT_AMBULATORY_CARE_PROVIDER_SITE_OTHER): Payer: Medicare Other | Admitting: Internal Medicine

## 2012-03-22 VITALS — BP 104/68 | HR 71 | Temp 98.2°F | Wt 131.0 lb

## 2012-03-22 DIAGNOSIS — S42109A Fracture of unspecified part of scapula, unspecified shoulder, initial encounter for closed fracture: Secondary | ICD-10-CM

## 2012-03-22 DIAGNOSIS — Z9181 History of falling: Secondary | ICD-10-CM | POA: Diagnosis not present

## 2012-03-22 DIAGNOSIS — K5909 Other constipation: Secondary | ICD-10-CM | POA: Diagnosis not present

## 2012-03-22 MED ORDER — TRAMADOL HCL 50 MG PO TABS: 50.0000 mg | ORAL_TABLET | Freq: Two times a day (BID) | ORAL | Status: AC

## 2012-03-22 NOTE — Progress Notes (Signed)
  Subjective:    Patient ID: Amy Fischer, female    DOB: Jun 04, 1929, 78 y.o.   MRN: [id number]  HPI Patient comes in followup from emergency room and urgent care after a fall last week where she tripped over oxygen cord enhancement her back against an object. She was noted to a small displaced fracture of the tip of the scapula on the left. Was told to take medication as needed and followup with PCP. She then called because pain was significant and she cannot take codeine. We called in some Ultram for which she is taking with some help and mild fogginess but has become constipated with this. She was also seen in urgent care yesterday because of question of rib fracture x-ray was negative for fracture.   She tries to take a deep breath but it does hurt and that began yesterday. She has no fever or cough at this time.   She denies specific lightheadedness although is a little bit off and no vertigo at present. There is no further falling.   Review of Systems Negative chest pain shortness of breath  she has been constipated for 3 days no nausea vomiting using stool softener she thinks this is from the medication  Past history family history social history reviewed in the electronic medical record.     Objective:   Physical Exam Well-developed well-nourished in no acute distress walks with her left arm mostly by her side to avoid pain Neck no point tenderness Chest: There is bruising along the left mid thorax paraspinal area. About 5 cm area Clear to auscultation. Cardiac S1-S2 no gallops negative CCE  weight changes. Blood pressure right arm 120/70 left arm 132/70. Sitting regular cuff Shoulder left can elevate to 90 hurts more on bringing the arm down. No abdominal tenderness. Neurologic grossly nonfocal walks gingerly  but well balanced. Oriented to time person and place.  Reviewed emergency room urgent care x-ray reports.     Assessment & Plan:  Status post fall tripped over her  oxygen cord. Small left scapular fracture no other bony abnormality seen on x-ray. No other complications at this time.  Pain medication discussed has constipation secondary to this and discussed management of this. Continue her stool softener use MiraLax Dulcolax as needed. Expectant management.   Total visit > 50% spent counseling and coordinating care  Data review

## 2012-03-22 NOTE — Patient Instructions (Addendum)
Take caution with the pain medication as it  can make you foggy. But getting up and moving around and taking deep breaths can prevent pneumonia. Your blood pressure is good today.  Have you try DULcolax  suppositories once a day for up to 3 days in a row if needed for constipation.  Continued to stool softener.  Add MiraLax one cap full a day mixed with water or other beverage to soften stool. This is a non-stimulant laxative.  It may take a day or 2 to be helpful but you can take it daily or every other day to help regulate your bowels.  It may take weeks for your pain to resolve.

## 2012-03-23 ENCOUNTER — Ambulatory Visit: Admitting: Internal Medicine

## 2012-03-31 ENCOUNTER — Encounter: Payer: Self-pay | Admitting: Internal Medicine

## 2012-03-31 DIAGNOSIS — S42109A Fracture of unspecified part of scapula, unspecified shoulder, initial encounter for closed fracture: Secondary | ICD-10-CM

## 2012-03-31 DIAGNOSIS — Z9181 History of falling: Secondary | ICD-10-CM | POA: Insufficient documentation

## 2012-03-31 DIAGNOSIS — K5909 Other constipation: Secondary | ICD-10-CM | POA: Insufficient documentation

## 2012-03-31 HISTORY — DX: Fracture of unspecified part of scapula, unspecified shoulder, initial encounter for closed fracture: S42.109A

## 2012-04-05 ENCOUNTER — Telehealth: Payer: Self-pay

## 2012-04-05 NOTE — Telephone Encounter (Signed)
Pt states she fell about 2 weeks ago and broke her shoulder.  Pt states she had x-rays done but continues to have a burning, stinging pain under her left breast.  Pt states this is very painful. Pt would like to know how long will this last and what can be done for pain.  Pls advise.

## 2012-04-06 NOTE — Telephone Encounter (Signed)
Pt states she is certainly better than she was a week ago.  Pt states the little pains she is having is under the left breast and rib area. Pt states she will take a few days and if she gets better she will know it is apart of the healing process if not pt will call back for a f/u visit.

## 2012-04-06 NOTE — Telephone Encounter (Signed)
She should be getting better.. refer her to orthopedics  .   She can come and see [place] in the meantimeif needed

## 2012-04-08 ENCOUNTER — Telehealth: Payer: Self-pay | Admitting: Internal Medicine

## 2012-04-08 NOTE — Telephone Encounter (Signed)
Patient called stating that she fell and broke her shoulder blade 3 weeks ago and now she is having sharp hot pains in her rib area and would like to be seen today. Please assist.

## 2012-04-08 NOTE — Telephone Encounter (Signed)
Offered pt an appt for 04/09/12 at 9:15 am.  Pt aware.

## 2012-04-09 ENCOUNTER — Encounter: Payer: Self-pay | Admitting: Internal Medicine

## 2012-04-09 ENCOUNTER — Ambulatory Visit (INDEPENDENT_AMBULATORY_CARE_PROVIDER_SITE_OTHER): Payer: Medicare Other | Admitting: Internal Medicine

## 2012-04-09 VITALS — BP 126/80 | HR 67 | Temp 98.3°F | Wt 129.0 lb

## 2012-04-09 DIAGNOSIS — R030 Elevated blood-pressure reading, without diagnosis of hypertension: Secondary | ICD-10-CM

## 2012-04-09 DIAGNOSIS — IMO0002 Reserved for concepts with insufficient information to code with codable children: Secondary | ICD-10-CM | POA: Diagnosis not present

## 2012-04-09 DIAGNOSIS — S42109A Fracture of unspecified part of scapula, unspecified shoulder, initial encounter for closed fracture: Secondary | ICD-10-CM | POA: Diagnosis not present

## 2012-04-09 DIAGNOSIS — Z9181 History of falling: Secondary | ICD-10-CM | POA: Diagnosis not present

## 2012-04-09 DIAGNOSIS — R0789 Other chest pain: Secondary | ICD-10-CM

## 2012-04-09 DIAGNOSIS — M792 Neuralgia and neuritis, unspecified: Secondary | ICD-10-CM

## 2012-04-09 DIAGNOSIS — R071 Chest pain on breathing: Secondary | ICD-10-CM

## 2012-04-09 MED ORDER — GABAPENTIN 300 MG PO CAPS: 300.0000 mg | ORAL_CAPSULE | Freq: Three times a day (TID) | ORAL | Status: DC

## 2012-04-09 NOTE — Patient Instructions (Signed)
We can add neurontin   For nerve  Pain   That is radiating . If  Not helping then consider getting ct scan and /or see orthopedics.  contact if shortness of breath or deterioration of condition.

## 2012-04-09 NOTE — Progress Notes (Signed)
Subjective:    Patient ID: Amy Fischer, female    DOB: [birth year], 78 y.o.   MRN: [id number]  HPI Pt comes in for fu of continued pain left chest side pain . See last ov and ed  Urgent care visits.   She sustained a trip fall and scapular fracture about 3 weeks ago and then develop sharp left  Rib area pain.  Had  Unrevealing chest x ray  Didn't want a ct scan at that point.   Scapular area is getting better but left back feels tight at times at shoulder  however she is having intermittent sharp burning shooting pains left anterior lower rib cage area that can radiate to side thorax  Comes and goes but very pain full  Less at night when lays down and doesn't move.  Denies cob of sig cough  Uses a pillow to splint .  taking ultram only ocass  And doesn't help that much anyway.    Review of Systems No fever syncope sob  other cp  Feels ok in between  Episodes  But pretty much has them most day ( non today )   Past history family history social history reviewed in the electronic medical record. Outpatient Prescriptions Prior to Visit  Medication Sig Dispense Refill  . aspirin 325 MG tablet Take 325 mg by mouth daily.        . calcium-vitamin D (OSCAL WITH D) 500-200 MG-UNIT per tablet Take 1 tablet by mouth daily.        . Cholecalciferol (VITAMIN D3) 1000 UNITS CAPS Take 1 capsule by mouth daily.       . diphenhydrAMINE (SOMINEX) 25 MG tablet Take 25 mg by mouth at bedtime as needed. For allergies and sleep      . docusate sodium (COLACE) 100 MG capsule Take 100 mg by mouth at bedtime.       [name] estropipate (OGEN) 0.75 MG tablet Take 0.75 mg by mouth 3 (three) times a week. Take on Monday, Wednesday and Friday days 1-25      . ezetimibe (ZETIA) 10 MG tablet Take 1 tablet (10 mg total) by mouth daily.  90 tablet  3  . fish oil-omega-3 fatty acids 1000 MG capsule Take 1 g by mouth daily.       [name] glucosamine-chondroitin 500-400 MG tablet Take 1 tablet by mouth daily.       [name] ibuprofen  (ADVIL,MOTRIN) 200 MG tablet Take 200 mg by mouth every 6 (six) hours as needed. Patient used this medication for a headache.      . levothyroxine (SYNTHROID) 88 MCG tablet Take 1 tablet (88 mcg total) by mouth daily.  90 tablet  1  . medroxyPROGESTERone (PROVERA) 5 MG tablet Take 5 mg by mouth 3 (three) times a week. Take on Monday, Wednesday and Friday of days 13-25      . Melatonin 3 MG TABS Take 1 tablet by mouth every other day.       . multivitamin (THERAGRAN) per tablet Take 1 tablet by mouth daily.        . nitroGLYCERIN (NITROSTAT) 0.4 MG SL tablet Place 1 tablet (0.4 mg total) under the tongue every 5 (five) minutes as needed for chest pain.  30 tablet  1  . ranitidine (ZANTAC) 300 MG capsule Take 1 capsule (300 mg total) by mouth 2 (two) times daily.  180 capsule  3  . rosuvastatin (CRESTOR) 5 MG tablet Take 2.5 mg by mouth daily.  Objective:   Physical Exam BP 126/80  Pulse 67  Temp(Src) 98.3 F (36.8 C) (Oral)  Wt 129 lb (58.514 kg)  SpO2 98% WDWN in nad looks  A bit washed out . Neck no masses Chest:  Clear to A&P without wheezes rales or rhonchi CV:  S1-S2 no gallops or murmurs peripheral perfusion is normal CHest wall non tender but point to area of left CC ? Junction and lower rib t 10  Neg lateral squeeze pain no rash  No midline spine tenderness and scapula no point tenderness.  OA changes no focal weakness .  Abdomen:  Sof,t normal bowel sounds without hepatosplenomegaly, no guarding rebound or masses no CVA tenderness No clubbing cyanosis or edema Neuro non focal motor  Oriented x 3 and no noted deficits in memory, attention, and speech.     reviewed x ray report and ed visit.  Assessment & Plan:   Left chest pain intermittent  neuropathic sounding   Sp injury  No obv cv pulm cause .   Consider further eval but wants to try med first .     Options discussed and all  meds  Options can have sedation se but will try gabapentin for poss nerve based pain  vs cartilage joint .    Then close follow up.   BP good today

## 2012-04-10 ENCOUNTER — Encounter: Payer: Self-pay | Admitting: Internal Medicine

## 2012-04-10 DIAGNOSIS — M792 Neuralgia and neuritis, unspecified: Secondary | ICD-10-CM | POA: Insufficient documentation

## 2012-04-10 DIAGNOSIS — R0789 Other chest pain: Secondary | ICD-10-CM | POA: Insufficient documentation

## 2012-04-15 DIAGNOSIS — Z961 Presence of intraocular lens: Secondary | ICD-10-CM | POA: Diagnosis not present

## 2012-04-27 ENCOUNTER — Encounter: Payer: Self-pay | Admitting: Internal Medicine

## 2012-04-27 ENCOUNTER — Ambulatory Visit (INDEPENDENT_AMBULATORY_CARE_PROVIDER_SITE_OTHER): Payer: Medicare Other | Admitting: Internal Medicine

## 2012-04-27 VITALS — BP 122/70 | HR 63 | Temp 98.0°F | Wt 129.0 lb

## 2012-04-27 DIAGNOSIS — S42109A Fracture of unspecified part of scapula, unspecified shoulder, initial encounter for closed fracture: Secondary | ICD-10-CM

## 2012-04-27 DIAGNOSIS — R300 Dysuria: Secondary | ICD-10-CM | POA: Diagnosis not present

## 2012-04-27 DIAGNOSIS — M1990 Unspecified osteoarthritis, unspecified site: Secondary | ICD-10-CM | POA: Diagnosis not present

## 2012-04-27 DIAGNOSIS — M810 Age-related osteoporosis without current pathological fracture: Secondary | ICD-10-CM | POA: Diagnosis not present

## 2012-04-27 DIAGNOSIS — Z9181 History of falling: Secondary | ICD-10-CM

## 2012-04-27 DIAGNOSIS — IMO0002 Reserved for concepts with insufficient information to code with codable children: Secondary | ICD-10-CM

## 2012-04-27 DIAGNOSIS — M792 Neuralgia and neuritis, unspecified: Secondary | ICD-10-CM

## 2012-04-27 LAB — POCT URINALYSIS DIPSTICK
Bilirubin, UA: NEGATIVE
Glucose, UA: NEGATIVE
Ketones, UA: NEGATIVE
Nitrite, UA: NEGATIVE

## 2012-04-27 NOTE — Progress Notes (Signed)
Subjective:    Patient ID: Amy Fischer, female    DOB: [birth year], 78 y.o.   MRN: [id number]  HPI Pt comesin for fu of pain and sp fall  Has about 75% improvement  Pain  Worse at end of lday feeels like the shingle pain  Has taken once a day of neurontin and too se dated  For the second.   Takes at night and helps sleep.  No new sx.   Had cystitis  With uti sx and took otc cystex and cranberry juice for a week and few better  No fever  Just stopped the meds   What to do about her bones  Had been of fosamax for years   taking ca vit d asks about parameters   Review of Systems No cp sob syncope no falling  Recently no vision change bleeding swelling   Past history family history social history reviewed in the electronic medical record. Outpatient Encounter Prescriptions as of 04/27/2012  Medication Sig Dispense Refill  . aspirin 325 MG tablet Take 325 mg by mouth daily.        . calcium-vitamin D (OSCAL WITH D) 500-200 MG-UNIT per tablet Take 1 tablet by mouth daily.        . Cholecalciferol (VITAMIN D3) 1000 UNITS CAPS Take 1 capsule by mouth daily.       . diphenhydrAMINE (SOMINEX) 25 MG tablet Take 25 mg by mouth at bedtime as needed. For allergies and sleep      . docusate sodium (COLACE) 100 MG capsule Take 100 mg by mouth at bedtime.       [name] estropipate (OGEN) 0.75 MG tablet Take 0.75 mg by mouth 3 (three) times a week. Take on Monday, Wednesday and Friday days 1-25      . ezetimibe (ZETIA) 10 MG tablet Take 1 tablet (10 mg total) by mouth daily.  90 tablet  3  . fexofenadine (ALLEGRA) 180 MG tablet Take 180 mg by mouth daily.      . fish oil-omega-3 fatty acids 1000 MG capsule Take 1 g by mouth daily.       [name] gabapentin (NEURONTIN) 300 MG capsule Take 1 capsule (300 mg total) by mouth 3 (three) times daily. Take 1 po qd and can slowly increase to tid for nerve pain  60 capsule  1  . glucosamine-chondroitin 500-400 MG tablet Take 1 tablet by mouth daily.       [name] ibuprofen  (ADVIL,MOTRIN) 200 MG tablet Take 200 mg by mouth every 6 (six) hours as needed. Patient used this medication for a headache.      . levothyroxine (SYNTHROID) 88 MCG tablet Take 1 tablet (88 mcg total) by mouth daily.  90 tablet  1  . medroxyPROGESTERone (PROVERA) 5 MG tablet Take 5 mg by mouth 3 (three) times a week. Take on Monday, Wednesday and Friday of days 13-25      . Melatonin 3 MG TABS Take 1 tablet by mouth every other day.       . multivitamin (THERAGRAN) per tablet Take 1 tablet by mouth daily.        . nitroGLYCERIN (NITROSTAT) 0.4 MG SL tablet Place 1 tablet (0.4 mg total) under the tongue every 5 (five) minutes as needed for chest pain.  30 tablet  1  . ranitidine (ZANTAC) 300 MG capsule Take 1 capsule (300 mg total) by mouth 2 (two) times daily.  180 capsule  3  . rosuvastatin (CRESTOR) 5 MG tablet Take  2.5 mg by mouth daily.             Objective:   Physical Exam BP 122/70  Pulse 63  Temp(Src) 98 F (36.7 C) (Oral)  Wt 129 lb (58.514 kg) WDWN in nad oa chanbes gait  Easy  Chest:  Clear to A&P without wheezes rales or rhonchi CV:  S1-S2 no gallops or murmurs peripheral perfusion is normal No focal pain although  Lays down gingerly  UA trc bl and wbc only   Abdomen:  Sof,t normal bowel sounds without hepatosplenomegaly, no guarding rebound or masses no CVA tenderness     Assessment & Plan:  Recovering sp fall scapular fracture  Pain tolerable some sedation with neurontin  but helps her sleep  Will continue as needed  Bone health  reviewed record  Last dexa 12 11  Osteopenia improved from 2006  Disc ca vit d weight bearing fall prevention and HO x 1 .   Hx of uti sx in the recent past  r with cranberry juice and pills .  cystex plus.  Will observe for now and call if recurrent sx .   Total visit > 50% spent counseling and coordinating care   Change fu appt from may to July

## 2012-04-27 NOTE — Patient Instructions (Signed)
Can continue to take the gabapentin at night with caution to avoid falling. When the pain is better you can choose to stop it.  Continue calcium and vitamin D and diet and food and weight bearing exercise as possible we should get a bone density next year or when you are due.

## 2012-05-07 ENCOUNTER — Ambulatory Visit: Payer: Medicare Other | Admitting: Internal Medicine

## 2012-06-08 ENCOUNTER — Telehealth: Payer: Self-pay | Admitting: Internal Medicine

## 2012-06-08 NOTE — Telephone Encounter (Signed)
Pt needs new rx levothyroxine 88 mcg #30 call into [facility] [phone]

## 2012-06-09 ENCOUNTER — Other Ambulatory Visit: Payer: Self-pay | Admitting: Family Medicine

## 2012-06-09 MED ORDER — LEVOTHYROXINE SODIUM 88 MCG PO TABS: 88.0000 ug | ORAL_TABLET | Freq: Every day | ORAL | Status: DC

## 2012-06-09 NOTE — Telephone Encounter (Signed)
I spoke to the pt.  She has not received her mail order.  Sent #30 with 0 refills to [facility].

## 2012-06-19 DIAGNOSIS — K219 Gastro-esophageal reflux disease without esophagitis: Secondary | ICD-10-CM | POA: Diagnosis not present

## 2012-06-19 DIAGNOSIS — R52 Pain, unspecified: Secondary | ICD-10-CM | POA: Diagnosis not present

## 2012-06-19 DIAGNOSIS — I498 Other specified cardiac arrhythmias: Secondary | ICD-10-CM | POA: Diagnosis not present

## 2012-06-19 DIAGNOSIS — S82009A Unspecified fracture of unspecified patella, initial encounter for closed fracture: Secondary | ICD-10-CM | POA: Diagnosis not present

## 2012-06-19 DIAGNOSIS — S62109A Fracture of unspecified carpal bone, unspecified wrist, initial encounter for closed fracture: Secondary | ICD-10-CM | POA: Diagnosis not present

## 2012-06-19 DIAGNOSIS — Z5189 Encounter for other specified aftercare: Secondary | ICD-10-CM | POA: Diagnosis not present

## 2012-06-19 DIAGNOSIS — I739 Peripheral vascular disease, unspecified: Secondary | ICD-10-CM | POA: Diagnosis present

## 2012-06-19 DIAGNOSIS — Z87891 Personal history of nicotine dependence: Secondary | ICD-10-CM | POA: Diagnosis not present

## 2012-06-19 DIAGNOSIS — S8290XD Unspecified fracture of unspecified lower leg, subsequent encounter for closed fracture with routine healing: Secondary | ICD-10-CM | POA: Diagnosis not present

## 2012-06-19 DIAGNOSIS — E039 Hypothyroidism, unspecified: Secondary | ICD-10-CM | POA: Diagnosis not present

## 2012-06-19 DIAGNOSIS — M810 Age-related osteoporosis without current pathological fracture: Secondary | ICD-10-CM | POA: Diagnosis not present

## 2012-06-19 DIAGNOSIS — I10 Essential (primary) hypertension: Secondary | ICD-10-CM | POA: Diagnosis present

## 2012-06-19 DIAGNOSIS — S62309A Unspecified fracture of unspecified metacarpal bone, initial encounter for closed fracture: Secondary | ICD-10-CM | POA: Diagnosis not present

## 2012-06-19 DIAGNOSIS — R0789 Other chest pain: Secondary | ICD-10-CM | POA: Diagnosis not present

## 2012-06-19 DIAGNOSIS — M159 Polyosteoarthritis, unspecified: Secondary | ICD-10-CM | POA: Diagnosis not present

## 2012-06-19 DIAGNOSIS — E785 Hyperlipidemia, unspecified: Secondary | ICD-10-CM | POA: Diagnosis not present

## 2012-06-19 DIAGNOSIS — E119 Type 2 diabetes mellitus without complications: Secondary | ICD-10-CM | POA: Diagnosis not present

## 2012-06-19 DIAGNOSIS — T1490XA Injury, unspecified, initial encounter: Secondary | ICD-10-CM | POA: Diagnosis not present

## 2012-06-19 DIAGNOSIS — M1990 Unspecified osteoarthritis, unspecified site: Secondary | ICD-10-CM | POA: Diagnosis present

## 2012-06-19 DIAGNOSIS — IMO0001 Reserved for inherently not codable concepts without codable children: Secondary | ICD-10-CM | POA: Diagnosis not present

## 2012-06-19 DIAGNOSIS — IMO0002 Reserved for concepts with insufficient information to code with codable children: Secondary | ICD-10-CM | POA: Diagnosis not present

## 2012-06-22 DIAGNOSIS — S62309A Unspecified fracture of unspecified metacarpal bone, initial encounter for closed fracture: Secondary | ICD-10-CM | POA: Diagnosis not present

## 2012-06-22 DIAGNOSIS — Z5189 Encounter for other specified aftercare: Secondary | ICD-10-CM | POA: Diagnosis not present

## 2012-06-22 DIAGNOSIS — K219 Gastro-esophageal reflux disease without esophagitis: Secondary | ICD-10-CM | POA: Diagnosis not present

## 2012-06-22 DIAGNOSIS — M810 Age-related osteoporosis without current pathological fracture: Secondary | ICD-10-CM | POA: Diagnosis not present

## 2012-06-22 DIAGNOSIS — IMO0001 Reserved for inherently not codable concepts without codable children: Secondary | ICD-10-CM | POA: Diagnosis not present

## 2012-06-22 DIAGNOSIS — S8290XD Unspecified fracture of unspecified lower leg, subsequent encounter for closed fracture with routine healing: Secondary | ICD-10-CM | POA: Diagnosis not present

## 2012-06-22 DIAGNOSIS — S82009A Unspecified fracture of unspecified patella, initial encounter for closed fracture: Secondary | ICD-10-CM | POA: Diagnosis not present

## 2012-06-22 DIAGNOSIS — E039 Hypothyroidism, unspecified: Secondary | ICD-10-CM | POA: Diagnosis not present

## 2012-06-22 DIAGNOSIS — E785 Hyperlipidemia, unspecified: Secondary | ICD-10-CM | POA: Diagnosis not present

## 2012-07-08 DIAGNOSIS — S62609A Fracture of unspecified phalanx of unspecified finger, initial encounter for closed fracture: Secondary | ICD-10-CM | POA: Diagnosis not present

## 2012-07-08 DIAGNOSIS — S82009A Unspecified fracture of unspecified patella, initial encounter for closed fracture: Secondary | ICD-10-CM | POA: Diagnosis not present

## 2012-07-13 ENCOUNTER — Ambulatory Visit (INDEPENDENT_AMBULATORY_CARE_PROVIDER_SITE_OTHER): Payer: Medicare Other | Admitting: Internal Medicine

## 2012-07-13 ENCOUNTER — Encounter: Payer: Self-pay | Admitting: Internal Medicine

## 2012-07-13 ENCOUNTER — Other Ambulatory Visit: Payer: Self-pay | Admitting: Family Medicine

## 2012-07-13 VITALS — BP 150/66 | HR 70 | Temp 97.7°F

## 2012-07-13 DIAGNOSIS — E039 Hypothyroidism, unspecified: Secondary | ICD-10-CM | POA: Diagnosis not present

## 2012-07-13 DIAGNOSIS — S82009A Unspecified fracture of unspecified patella, initial encounter for closed fracture: Secondary | ICD-10-CM

## 2012-07-13 DIAGNOSIS — M1990 Unspecified osteoarthritis, unspecified site: Secondary | ICD-10-CM

## 2012-07-13 DIAGNOSIS — M810 Age-related osteoporosis without current pathological fracture: Secondary | ICD-10-CM | POA: Diagnosis not present

## 2012-07-13 DIAGNOSIS — E785 Hyperlipidemia, unspecified: Secondary | ICD-10-CM | POA: Diagnosis not present

## 2012-07-13 DIAGNOSIS — R030 Elevated blood-pressure reading, without diagnosis of hypertension: Secondary | ICD-10-CM

## 2012-07-13 DIAGNOSIS — S62609A Fracture of unspecified phalanx of unspecified finger, initial encounter for closed fracture: Secondary | ICD-10-CM

## 2012-07-13 DIAGNOSIS — Z9181 History of falling: Secondary | ICD-10-CM

## 2012-07-13 HISTORY — DX: Unspecified fracture of unspecified patella, initial encounter for closed fracture: S82.009A

## 2012-07-13 LAB — CBC WITH DIFFERENTIAL/PLATELET
Basophils Absolute: 0.1 10*3/uL (ref 0.0–0.1)
Eosinophils Relative: 3.6 % (ref 0.0–5.0)
Lymphs Abs: 1.5 10*3/uL (ref 0.7–4.0)
Monocytes Relative: 8.1 % (ref 3.0–12.0)
Neutrophils Relative %: 62.5 % (ref 43.0–77.0)
Platelets: 245 10*3/uL (ref 150.0–400.0)
RDW: 14.4 % (ref 11.5–14.6)
WBC: 6.1 10*3/uL (ref 4.5–10.5)

## 2012-07-13 MED ORDER — LEVOTHYROXINE SODIUM 88 MCG PO TABS: 88.0000 ug | ORAL_TABLET | Freq: Every day | ORAL | Status: DC

## 2012-07-13 MED ORDER — AZELASTINE HCL 0.1 % NA SOLN: 1.0000 | Freq: Two times a day (BID) | NASAL | Status: DC

## 2012-07-13 MED ORDER — EZETIMIBE 10 MG PO TABS: 10.0000 mg | ORAL_TABLET | Freq: Every day | ORAL | Status: DC

## 2012-07-13 NOTE — Patient Instructions (Signed)
Laboratory studies today we'll make a decision on medicine for bones we can always go back on the Fosamax with D.  The other options are prolia or Forteo these are injections.  Check Bp readings at home  And if persistently 150 and above contact us.  Followup depending on labs.

## 2012-07-13 NOTE — Progress Notes (Signed)
  Subjective:    Patient ID: Amy Fischer, female    DOB: 04-26-1929, 78 y.o.   MRN: [id number]  HPI Patient comes in today for followup visit with family member. However since her last visit she has had a fall about 3 weeks ago when she was in [place] walking on uneven pavement and slipped forward. She fell directly on her left knee and hand had a fractured patella and a broken finger evaluated in the local emergency room. Since that time she has seen Dr. Charlann Boxer and [facility] orthopedics who has her in a brace and is to follow up soon. She walks with a brace and a cane. Has assistance with getting into the bathtub but otherwise is pretty independent. No chest pain shortness of breath her blood pressure was elevated in the emergency room had been okay before this injury in the 1:30 range.  She is a bit frustrated about the situation. Taking tramadol at night as needed for pain. Needs refills of medications. Has now been off hormone replacement therapy for 4 to months or so per Dr. Ambrose Mantle. Gets occasional hot flushes. Taking vitamin D. Needs refill for her thyroid medicine taking regularly needs a refill for her cholesterol medicine Crestor and he seemed to be out of her ranitidine.  Symptoms from her scapular fracture are resolved including her arm pain and back pain. Review of Systems Negative for chest pain shortness of breath neurologic changes loss of consciousness head trauma. She is taking Astelin nose spray for runny nose. Past history family history social history reviewed in the electronic medical record.      Objective:   Physical Exam BP 150/66  Pulse 70  Temp 97.7 F (36.5 C) (Oral)  SpO2 98% Repeat Bp reading 158/70 right arm  Well-developed well-nourished and nontoxic leg E. in no acute distress sitting on a chair with left lower extremity brace from the knee down. Left 2 middle fingers are buddy taped. Neck is without masses or thyromegaly chest clear to auscultation  cardiac regular rhythm. Extremities good perfusion normal capillary refill. Chest cta cor no murmur and rr.  Review of the appropriate notes about her injury.    Assessment & Plan:  Status post trip and fall fracture left patella and left finger. Had just resolved her scapular fracture. Osteoporosis  had been on Fosamax D. for number of years and it had been held last fall because of chest symptoms were felt to be esophageal ; uncertain if this was the culprit.  We should check a vitamin D and PTH in BMP level today and include her TSH monitoring. Depending on these results we will make a plan we can put her back on the Fosamax D. for now consider other options such as pearly Forteo or reclast  If renal function is adequate. Elevated blood pressure readings has been elevated under stress in the past or situations has been okay at home should monitor at home intervention appropriate if needed. History of a fall and trip again no evidence of acute neurologic or cardiovascular trigger.

## 2012-07-14 LAB — BASIC METABOLIC PANEL
CO2: 28 mEq/L (ref 19–32)
Calcium: 9.7 mg/dL (ref 8.4–10.5)
Chloride: 105 mEq/L (ref 96–112)
Creatinine, Ser: 0.8 mg/dL (ref 0.4–1.2)
Sodium: 141 mEq/L (ref 135–145)

## 2012-07-14 LAB — PTH, INTACT AND CALCIUM
Calcium, Total (PTH): 10 mg/dL (ref 8.4–10.5)
PTH: 133.7 pg/mL — ABNORMAL HIGH (ref 14.0–72.0)

## 2012-07-14 LAB — VITAMIN D 25 HYDROXY (VIT D DEFICIENCY, FRACTURES): Vit D, 25-Hydroxy: 46 ng/mL (ref 30–89)

## 2012-07-14 LAB — TSH: TSH: 7.38 u[IU]/mL — ABNORMAL HIGH (ref 0.35–5.50)

## 2012-07-19 ENCOUNTER — Other Ambulatory Visit: Payer: Self-pay | Admitting: Family Medicine

## 2012-07-19 DIAGNOSIS — E213 Hyperparathyroidism, unspecified: Secondary | ICD-10-CM

## 2012-07-19 DIAGNOSIS — E039 Hypothyroidism, unspecified: Secondary | ICD-10-CM

## 2012-07-19 DIAGNOSIS — IMO0002 Reserved for concepts with insufficient information to code with codable children: Secondary | ICD-10-CM

## 2012-07-20 ENCOUNTER — Other Ambulatory Visit (INDEPENDENT_AMBULATORY_CARE_PROVIDER_SITE_OTHER): Payer: Medicare Other

## 2012-07-20 DIAGNOSIS — E039 Hypothyroidism, unspecified: Secondary | ICD-10-CM

## 2012-07-20 DIAGNOSIS — R799 Abnormal finding of blood chemistry, unspecified: Secondary | ICD-10-CM

## 2012-07-20 DIAGNOSIS — IMO0002 Reserved for concepts with insufficient information to code with codable children: Secondary | ICD-10-CM

## 2012-07-20 LAB — T4, FREE: Free T4: 0.98 ng/dL (ref 0.60–1.60)

## 2012-07-27 ENCOUNTER — Telehealth: Payer: Self-pay | Admitting: Internal Medicine

## 2012-07-27 NOTE — Telephone Encounter (Signed)
Pt called req to get lab results asap.

## 2012-07-27 NOTE — Telephone Encounter (Signed)
Given by telephone.

## 2012-07-30 DIAGNOSIS — S62609A Fracture of unspecified phalanx of unspecified finger, initial encounter for closed fracture: Secondary | ICD-10-CM | POA: Diagnosis not present

## 2012-07-30 DIAGNOSIS — S82009A Unspecified fracture of unspecified patella, initial encounter for closed fracture: Secondary | ICD-10-CM | POA: Diagnosis not present

## 2012-08-02 ENCOUNTER — Telehealth: Payer: Self-pay | Admitting: Internal Medicine

## 2012-08-02 NOTE — Telephone Encounter (Signed)
Pt called stating that she spoke with you in regard to her Thyroid lab being normal. Pt is set up to see Dr. Talmage Nap and pt would like to know if she needs to keep this appt being that her labs were normal. Pt requesting a call back and a detailed message to be left if she does not answer

## 2012-08-02 NOTE — Telephone Encounter (Signed)
I would still like her to go  To Dr Talmage Nap  Endocrine because of the abnormalities off and on the osteoporosis and slightly elevated PTH level on the other tests.    ( please Make sure dr Talmage Nap has all of the labs  Involved before the visit)

## 2012-08-02 NOTE — Telephone Encounter (Signed)
Last labs were normal.  Please advise?

## 2012-08-03 DIAGNOSIS — Z87891 Personal history of nicotine dependence: Secondary | ICD-10-CM | POA: Diagnosis not present

## 2012-08-03 DIAGNOSIS — S8290XD Unspecified fracture of unspecified lower leg, subsequent encounter for closed fracture with routine healing: Secondary | ICD-10-CM | POA: Diagnosis not present

## 2012-08-03 DIAGNOSIS — IMO0001 Reserved for inherently not codable concepts without codable children: Secondary | ICD-10-CM | POA: Diagnosis not present

## 2012-08-03 DIAGNOSIS — R269 Unspecified abnormalities of gait and mobility: Secondary | ICD-10-CM | POA: Diagnosis not present

## 2012-08-03 DIAGNOSIS — E039 Hypothyroidism, unspecified: Secondary | ICD-10-CM | POA: Diagnosis not present

## 2012-08-03 DIAGNOSIS — S82009A Unspecified fracture of unspecified patella, initial encounter for closed fracture: Secondary | ICD-10-CM | POA: Diagnosis not present

## 2012-08-03 NOTE — Telephone Encounter (Signed)
Pt notified to keep her appt with Dr. Talmage Nap.  I made copies of her labs to send with her and put them in the mail.

## 2012-08-06 DIAGNOSIS — E039 Hypothyroidism, unspecified: Secondary | ICD-10-CM | POA: Diagnosis not present

## 2012-08-06 DIAGNOSIS — Z87891 Personal history of nicotine dependence: Secondary | ICD-10-CM | POA: Diagnosis not present

## 2012-08-06 DIAGNOSIS — R269 Unspecified abnormalities of gait and mobility: Secondary | ICD-10-CM | POA: Diagnosis not present

## 2012-08-06 DIAGNOSIS — IMO0001 Reserved for inherently not codable concepts without codable children: Secondary | ICD-10-CM | POA: Diagnosis not present

## 2012-08-06 DIAGNOSIS — S8290XD Unspecified fracture of unspecified lower leg, subsequent encounter for closed fracture with routine healing: Secondary | ICD-10-CM | POA: Diagnosis not present

## 2012-08-11 DIAGNOSIS — R269 Unspecified abnormalities of gait and mobility: Secondary | ICD-10-CM | POA: Diagnosis not present

## 2012-08-11 DIAGNOSIS — E039 Hypothyroidism, unspecified: Secondary | ICD-10-CM | POA: Diagnosis not present

## 2012-08-11 DIAGNOSIS — IMO0001 Reserved for inherently not codable concepts without codable children: Secondary | ICD-10-CM | POA: Diagnosis not present

## 2012-08-11 DIAGNOSIS — S8290XD Unspecified fracture of unspecified lower leg, subsequent encounter for closed fracture with routine healing: Secondary | ICD-10-CM | POA: Diagnosis not present

## 2012-08-11 DIAGNOSIS — Z87891 Personal history of nicotine dependence: Secondary | ICD-10-CM | POA: Diagnosis not present

## 2012-08-18 DIAGNOSIS — R269 Unspecified abnormalities of gait and mobility: Secondary | ICD-10-CM | POA: Diagnosis not present

## 2012-08-18 DIAGNOSIS — E039 Hypothyroidism, unspecified: Secondary | ICD-10-CM | POA: Diagnosis not present

## 2012-08-18 DIAGNOSIS — Z87891 Personal history of nicotine dependence: Secondary | ICD-10-CM | POA: Diagnosis not present

## 2012-08-18 DIAGNOSIS — S8290XD Unspecified fracture of unspecified lower leg, subsequent encounter for closed fracture with routine healing: Secondary | ICD-10-CM | POA: Diagnosis not present

## 2012-08-18 DIAGNOSIS — IMO0001 Reserved for inherently not codable concepts without codable children: Secondary | ICD-10-CM | POA: Diagnosis not present

## 2012-08-25 DIAGNOSIS — R269 Unspecified abnormalities of gait and mobility: Secondary | ICD-10-CM | POA: Diagnosis not present

## 2012-08-25 DIAGNOSIS — S8290XD Unspecified fracture of unspecified lower leg, subsequent encounter for closed fracture with routine healing: Secondary | ICD-10-CM | POA: Diagnosis not present

## 2012-08-25 DIAGNOSIS — Z87891 Personal history of nicotine dependence: Secondary | ICD-10-CM | POA: Diagnosis not present

## 2012-08-25 DIAGNOSIS — E039 Hypothyroidism, unspecified: Secondary | ICD-10-CM | POA: Diagnosis not present

## 2012-08-25 DIAGNOSIS — IMO0001 Reserved for inherently not codable concepts without codable children: Secondary | ICD-10-CM | POA: Diagnosis not present

## 2012-08-27 DIAGNOSIS — S82009A Unspecified fracture of unspecified patella, initial encounter for closed fracture: Secondary | ICD-10-CM | POA: Diagnosis not present

## 2012-08-27 DIAGNOSIS — S62609A Fracture of unspecified phalanx of unspecified finger, initial encounter for closed fracture: Secondary | ICD-10-CM | POA: Diagnosis not present

## 2012-08-31 DIAGNOSIS — S8290XD Unspecified fracture of unspecified lower leg, subsequent encounter for closed fracture with routine healing: Secondary | ICD-10-CM | POA: Diagnosis not present

## 2012-08-31 DIAGNOSIS — Z87891 Personal history of nicotine dependence: Secondary | ICD-10-CM | POA: Diagnosis not present

## 2012-08-31 DIAGNOSIS — IMO0001 Reserved for inherently not codable concepts without codable children: Secondary | ICD-10-CM | POA: Diagnosis not present

## 2012-08-31 DIAGNOSIS — R269 Unspecified abnormalities of gait and mobility: Secondary | ICD-10-CM | POA: Diagnosis not present

## 2012-08-31 DIAGNOSIS — E039 Hypothyroidism, unspecified: Secondary | ICD-10-CM | POA: Diagnosis not present

## 2012-09-02 DIAGNOSIS — E213 Hyperparathyroidism, unspecified: Secondary | ICD-10-CM | POA: Diagnosis not present

## 2012-09-02 DIAGNOSIS — E039 Hypothyroidism, unspecified: Secondary | ICD-10-CM | POA: Diagnosis not present

## 2012-09-02 DIAGNOSIS — M810 Age-related osteoporosis without current pathological fracture: Secondary | ICD-10-CM | POA: Diagnosis not present

## 2012-09-07 DIAGNOSIS — Z1231 Encounter for screening mammogram for malignant neoplasm of breast: Secondary | ICD-10-CM | POA: Diagnosis not present

## 2012-09-08 DIAGNOSIS — S82009A Unspecified fracture of unspecified patella, initial encounter for closed fracture: Secondary | ICD-10-CM | POA: Diagnosis not present

## 2012-09-10 DIAGNOSIS — S82009A Unspecified fracture of unspecified patella, initial encounter for closed fracture: Secondary | ICD-10-CM | POA: Diagnosis not present

## 2012-09-14 ENCOUNTER — Ambulatory Visit: Payer: Medicare Other | Admitting: Internal Medicine

## 2012-09-14 DIAGNOSIS — S82009A Unspecified fracture of unspecified patella, initial encounter for closed fracture: Secondary | ICD-10-CM | POA: Diagnosis not present

## 2012-09-16 DIAGNOSIS — S82009A Unspecified fracture of unspecified patella, initial encounter for closed fracture: Secondary | ICD-10-CM | POA: Diagnosis not present

## 2012-09-21 ENCOUNTER — Ambulatory Visit (INDEPENDENT_AMBULATORY_CARE_PROVIDER_SITE_OTHER): Payer: Medicare Other | Admitting: Internal Medicine

## 2012-09-21 ENCOUNTER — Encounter: Payer: Self-pay | Admitting: Internal Medicine

## 2012-09-21 VITALS — BP 160/70 | HR 79 | Temp 97.8°F | Wt 124.0 lb

## 2012-09-21 DIAGNOSIS — R030 Elevated blood-pressure reading, without diagnosis of hypertension: Secondary | ICD-10-CM | POA: Diagnosis not present

## 2012-09-21 DIAGNOSIS — M810 Age-related osteoporosis without current pathological fracture: Secondary | ICD-10-CM | POA: Diagnosis not present

## 2012-09-21 DIAGNOSIS — E039 Hypothyroidism, unspecified: Secondary | ICD-10-CM | POA: Diagnosis not present

## 2012-09-21 DIAGNOSIS — K219 Gastro-esophageal reflux disease without esophagitis: Secondary | ICD-10-CM | POA: Diagnosis not present

## 2012-09-21 DIAGNOSIS — S82009A Unspecified fracture of unspecified patella, initial encounter for closed fracture: Secondary | ICD-10-CM | POA: Diagnosis not present

## 2012-09-21 MED ORDER — RANITIDINE HCL 300 MG PO CAPS: 300.0000 mg | ORAL_CAPSULE | Freq: Two times a day (BID) | ORAL | Status: DC

## 2012-09-21 MED ORDER — AMLODIPINE BESYLATE 2.5 MG PO TABS: 2.5000 mg | ORAL_TABLET | Freq: Every day | ORAL | Status: DC

## 2012-09-21 NOTE — Progress Notes (Signed)
Subjective:    Patient ID: Amy Fischer, female    DOB: 14-May-1929, 78 y.o.   MRN: [id number]  HPI Patient comes in today for follow up of  multiple medical problems.  She is noted to have elevated blood pressure readings most of the time since June the home health nurses took readings in the 1 5160s patient herself states she's getting in the 150 range. At first she thought it was secondary to pain in her knee but it seems to be higher in most of the time. She did have an endocrinology evaluation by Dr. Silas [place] is on thyroid medicine was told that everything is okay at this time I do not have a copy of this evaluation. She is uncertain if they discussed hyperparathyroidism. She is a bit discouraged because her mobility has decreased but she is doing much better than in the past she is now independent living by herself. She is still going through rehabilitation exercises. Review of Systems Negative currently for chest pain shortness of breath recent falling urinary tract infection symptoms bruising or bleeding. No swelling otherwise. Rest as per history of present illness  Past history family history social history reviewed in the electronic medical record.  Outpatient Encounter Prescriptions as of 09/21/2012  Medication Sig Dispense Refill  . aspirin 325 MG tablet Take 325 mg by mouth daily.        [name] azelastine (ASTELIN) 137 MCG/SPRAY nasal spray Place 1 spray into the nose 2 (two) times daily. Use in each nostril as directed  30 mL  5  . calcium-vitamin D (OSCAL WITH D) 500-200 MG-UNIT per tablet Take 1 tablet by mouth daily.        . Cholecalciferol (VITAMIN D3) 1000 UNITS CAPS Take 1 capsule by mouth daily.       . diphenhydrAMINE (SOMINEX) 25 MG tablet Take 25 mg by mouth at bedtime as needed. For allergies and sleep      . docusate sodium (COLACE) 100 MG capsule Take 100 mg by mouth at bedtime.       [name] ezetimibe (ZETIA) 10 MG tablet Take 1 tablet (10 mg total) by mouth daily.  90 tablet   3  . fexofenadine (ALLEGRA) 180 MG tablet Take 180 mg by mouth daily.      . fish oil-omega-3 fatty acids 1000 MG capsule Take 1 g by mouth daily.       [name] glucosamine-chondroitin 500-400 MG tablet Take 1 tablet by mouth daily.       [name] ibuprofen (ADVIL,MOTRIN) 200 MG tablet Take 200 mg by mouth every 6 (six) hours as needed. Patient used this medication for a headache.      . levothyroxine (SYNTHROID) 88 MCG tablet Take 1 tablet (88 mcg total) by mouth daily.  30 tablet  11  . Melatonin 3 MG TABS Take 1 tablet by mouth every other day.       . multivitamin (THERAGRAN) per tablet Take 1 tablet by mouth daily.        . nitroGLYCERIN (NITROSTAT) 0.4 MG SL tablet Place 1 tablet (0.4 mg total) under the tongue every 5 (five) minutes as needed for chest pain.  30 tablet  1  . ranitidine (ZANTAC) 300 MG capsule Take 1 capsule (300 mg total) by mouth 2 (two) times daily.  180 capsule  3  . rosuvastatin (CRESTOR) 5 MG tablet Take 2.5 mg by mouth daily.        [name] estropipate (OGEN) 0.75 MG tablet Take 0.75 mg  by mouth 3 (three) times a week. Take on Monday, Wednesday and Friday days 1-25      . medroxyPROGESTERone (PROVERA) 5 MG tablet Take 5 mg by mouth 3 (three) times a week. Take on Monday, Wednesday and Friday of days 13-25           Objective:   Physical Exam BP 160/70  Pulse 79  Temp 97.8 F (36.6 C) (Oral)  Wt 124 lb (56.246 kg)  SpO2 96% Well-developed well-nourished alert white female no acute distress degenerative osteoarthritis changes seen on her hands. Her gait is minimally antalgic she is ambulatory and independent. She is cognitively intact. Blood pressure readings 132/70 left 144/60 right. Heart he had regular rhythm negative CCE.  Reviewed of the last laboratory studies showed an elevated TSH better on repeat and an elevated PTH in the 120 range with a calcium of 10 Cannot find the consultation note from Dr. Talmage Nap in the record. Oriented x 3 and no noted deficits in memory,  attention, and speech.     Assessment & Plan: Elelvated BP readings  elelvated elelvted   ddds   Elevated blood pressure  persistent for a couple months. 1 50  160 range at home getting 140s in the office we'll add low-dose Norvasc 2.5 mg a day can take it at night to avoid side effects. She has a history of subclavian steal however blood pressure on right arm and left arm are both a bit elevated.   History of elevated TSH and PTH has seen Dr. Talmage Nap and was told that everything is okay on her current medicines but I don't have the note reviewed today. Her parathyroid hormone was elevated at the last visit certainly hyperparathyroidism could be contributing to her symptoms. She does have osteoporosis and is back on her Fosamax D she is a bit frustrated about her knee limitations with her exercise    Needs a refill on her ranitidine this has been helpful to her her stomach.  Patient will contact  endocrinology office to get [place] a copy of evaluation we will also try to contact or see if the document is scanned incorrectly into the ehr Total visit > 50% spent counseling and coordinating care

## 2012-09-21 NOTE — Patient Instructions (Addendum)
Begin low dose  Amlodipine 2.5 mg per day   Get [place] a copy of Dr Talmage Nap report and labs done  To [place].  ROV in about 6- 8 weeks or as needed.

## 2012-09-23 DIAGNOSIS — S82009A Unspecified fracture of unspecified patella, initial encounter for closed fracture: Secondary | ICD-10-CM | POA: Diagnosis not present

## 2012-09-29 DIAGNOSIS — S82009A Unspecified fracture of unspecified patella, initial encounter for closed fracture: Secondary | ICD-10-CM | POA: Diagnosis not present

## 2012-10-01 DIAGNOSIS — S82009A Unspecified fracture of unspecified patella, initial encounter for closed fracture: Secondary | ICD-10-CM | POA: Diagnosis not present

## 2012-10-04 DIAGNOSIS — S82009A Unspecified fracture of unspecified patella, initial encounter for closed fracture: Secondary | ICD-10-CM | POA: Diagnosis not present

## 2012-10-06 DIAGNOSIS — S82009A Unspecified fracture of unspecified patella, initial encounter for closed fracture: Secondary | ICD-10-CM | POA: Diagnosis not present

## 2012-10-22 DIAGNOSIS — Z23 Encounter for immunization: Secondary | ICD-10-CM | POA: Diagnosis not present

## 2012-11-09 ENCOUNTER — Ambulatory Visit (INDEPENDENT_AMBULATORY_CARE_PROVIDER_SITE_OTHER): Payer: Medicare Other | Admitting: Internal Medicine

## 2012-11-09 ENCOUNTER — Encounter: Payer: Self-pay | Admitting: Internal Medicine

## 2012-11-09 VITALS — BP 128/72 | HR 75 | Temp 98.3°F | Wt 122.0 lb

## 2012-11-09 DIAGNOSIS — M810 Age-related osteoporosis without current pathological fracture: Secondary | ICD-10-CM | POA: Diagnosis not present

## 2012-11-09 DIAGNOSIS — G458 Other transient cerebral ischemic attacks and related syndromes: Secondary | ICD-10-CM | POA: Diagnosis not present

## 2012-11-09 DIAGNOSIS — L608 Other nail disorders: Secondary | ICD-10-CM

## 2012-11-09 DIAGNOSIS — R030 Elevated blood-pressure reading, without diagnosis of hypertension: Secondary | ICD-10-CM | POA: Diagnosis not present

## 2012-11-09 DIAGNOSIS — Q846 Other congenital malformations of nails: Secondary | ICD-10-CM

## 2012-11-09 NOTE — Progress Notes (Signed)
Chief Complaint  Patient presents with  . Follow-up    HPI: Pt comesin today for fu of elevated BP  Other issues . Since last time she his taking low dose amlodiipine and no se noted  bp readings a t home are in the 130 range  . No new dizziness of syncope for fall . Still feels imbalance and has had pt for training . Doing ok.  Check toe 4 left foot  Since injury fracture has noted nail is deformed and ? Off unsure if will come back .  ROS: See pertinent positives and negatives per HPI.  Past Medical History  Diagnosis Date  . GERD (gastroesophageal reflux disease)   . Hyperlipidemia   . Hypothyroidism   . Osteoarthritis   . Osteoporosis   . Episodic recurrent vertigo     MRI Head 2003  . Allergy   . Bladder polyps   . Subclavian steal syndrome     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; CTA 2013 revealed open graft  . Hyperplastic colon polyp   . Esophageal spasm   . History of hepatitis     unknown type  . Asymptomatic carotid artery stenosis     R ICA 40% stenosis on CTA 12/2011.  [name] Anemia   . Cataract     BILATERAL-REMOVED  . Elevated blood pressure 03/25/2011    Bp readings borderline today has hx of elvation  In office and ok at home   Not checke recently   She gfeels was elevated from anxiety      Family History  Problem Relation Age of Onset  . Hypertension Mother   . Heart disease Father   . Stroke Mother   . Colon cancer Neg Hx     History   Social History  . Marital Status: Widowed    Spouse Name: N/A    Number of Children: 4  . Years of Education: N/A   Occupational History  . retired    Social History Main Topics  . Smoking status: Former Smoker    Quit date: 01/05/1991  . Smokeless tobacco: Never Used  . Alcohol Use: 2.4 oz/week    4 Glasses of wine per week     Comment: socially  . Drug Use: No  . Sexually Active: None   Other Topics Concern  . None   Social History Narrative   WidowedHH of 1 No petsFormer smokerExercises  regularly    Outpatient Encounter Prescriptions as of 11/09/2012  Medication Sig Dispense Refill  . amLODipine (NORVASC) 2.5 MG tablet Take 1 tablet (2.5 mg total) by mouth daily.  30 tablet  3  . aspirin 325 MG tablet Take 325 mg by mouth daily.        [name] azelastine (ASTELIN) 137 MCG/SPRAY nasal spray Place 1 spray into the nose 2 (two) times daily. Use in each nostril as directed  30 mL  5  . calcium-vitamin D (OSCAL WITH D) 500-200 MG-UNIT per tablet Take 1 tablet by mouth daily.        . Cholecalciferol (VITAMIN D3) 1000 UNITS CAPS Take 1 capsule by mouth daily.       . diphenhydrAMINE (SOMINEX) 25 MG tablet Take 25 mg by mouth at bedtime as needed. For allergies and sleep      . docusate sodium (COLACE) 100 MG capsule Take 100 mg by mouth at bedtime.       [name] estropipate (OGEN) 0.75 MG tablet Take 0.75 mg by mouth 3 (  three) times a week. Take on Monday, Wednesday and Friday days 1-25      . ezetimibe (ZETIA) 10 MG tablet Take 1 tablet (10 mg total) by mouth daily.  90 tablet  3  . fexofenadine (ALLEGRA) 180 MG tablet Take 180 mg by mouth daily.      . fish oil-omega-3 fatty acids 1000 MG capsule Take 1 g by mouth daily.       [name] glucosamine-chondroitin 500-400 MG tablet Take 1 tablet by mouth daily.       [name] ibuprofen (ADVIL,MOTRIN) 200 MG tablet Take 200 mg by mouth every 6 (six) hours as needed. Patient used this medication for a headache.      . levothyroxine (SYNTHROID) 88 MCG tablet Take 1 tablet (88 mcg total) by mouth daily.  30 tablet  11  . medroxyPROGESTERone (PROVERA) 5 MG tablet Take 5 mg by mouth 3 (three) times a week. Take on Monday, Wednesday and Friday of days 13-25      . Melatonin 3 MG TABS Take 1 tablet by mouth every other day.       . multivitamin (THERAGRAN) per tablet Take 1 tablet by mouth daily.        . nitroGLYCERIN (NITROSTAT) 0.4 MG SL tablet Place 1 tablet (0.4 mg total) under the tongue every 5 (five) minutes as needed for chest pain.  30 tablet  1  .  ranitidine (ZANTAC) 300 MG capsule Take 1 capsule (300 mg total) by mouth 2 (two) times daily.  180 capsule  3  . rosuvastatin (CRESTOR) 5 MG tablet Take 2.5 mg by mouth daily.           EXAM:  BP 128/72  Pulse 75  Temp 98.3 F (36.8 C) (Oral)  Wt 122 lb (55.339 kg)  SpO2 97%  There is no height on file to calculate BMI.  GENERAL: vitals reviewed and listed above, alert, oriented, appears well hydrated and in no acute distress BP readings 128/70n and 128/64 good ue pulses .  HEENT: atraumatic, conjunctiva  clear, no obvious abnormalities on inspection of external nose and ears OP : no lesion edema or exudate  NECK: no obvious masses on inspection palpation  LUNGS: clear to auscultation bilaterally, no wheezes, rales or rhonchi, CV: HRRR, no clubbing cyanosis or  peripheral edema nl cap refill   MS: OA changes no  Redness or swelling otherwise  Independent gait  Oriented x 3 and no noted deficits in memory, attention, and speech. Left 4th toe nail deformed without redness or warmth   PSYCH: pleasant and cooperative, no obvious depression or anxiety  ASSESSMENT AND PLAN:  Discussed the following assessment and plan:  1. Elevated blood pressure    better on meds low dose no se   2. OSTEOPOROSIS    se actonel on hrt consider prolia fx after fall  3. SUBCLAVIAN STEAL SYNDROME W/Bypass graft    No new sx ; cta 2013 40 % bypass graft occlusion  get dopplers 2014  4. Dysplastic toenail    poss from trauma observe   information on prolia  Supposed to repeat dopplers in January 2014   No sx  Will review record and order as appropriate. -Patient advised to return or notify health care team  immediately if symptoms worsen or persist or new concerns arise.  Patient Instructions  Your blood pressure is much better . Continue same medication . Do not think the toe nail change is a serious problem monitor for redness or  worsening . ROV in 4- 6 months or as needed Will review for  when due for repeat carotid imaging dopplers.   Neta Mends. Mairin Lindsley M.D.

## 2012-11-09 NOTE — Patient Instructions (Addendum)
Your blood pressure is much better . Continue same medication . Do not think the toe nail change is a serious problem monitor for redness or worsening . ROV in 4- 6 months or as needed Will review for when due for repeat carotid imaging dopplers.

## 2012-11-13 ENCOUNTER — Encounter: Payer: Self-pay | Admitting: Internal Medicine

## 2012-11-17 ENCOUNTER — Other Ambulatory Visit: Payer: Self-pay | Admitting: *Deleted

## 2012-11-17 DIAGNOSIS — G458 Other transient cerebral ischemic attacks and related syndromes: Secondary | ICD-10-CM

## 2012-11-19 ENCOUNTER — Encounter (INDEPENDENT_AMBULATORY_CARE_PROVIDER_SITE_OTHER): Payer: Medicare Other

## 2012-11-19 DIAGNOSIS — I6529 Occlusion and stenosis of unspecified carotid artery: Secondary | ICD-10-CM

## 2012-12-03 ENCOUNTER — Telehealth: Payer: Self-pay | Admitting: Family Medicine

## 2012-12-03 NOTE — Telephone Encounter (Signed)
Called the pt to give her the results of her doppler scan. She is having difficulty taking her BP med.  She says it causes her to become dizzy.  She has frequent dull headaches and at times cannot focus her eyes.  She would like to change medications.  Please advise.  Thanks!!!

## 2012-12-07 NOTE — Telephone Encounter (Signed)
We can stop the amlodipine if making her dizzy  Stop until feels better and then can try another medication. Contact us i f dizziness doesn't get better and ROV   Lisinopril 5 mg 1 po qd if never known allergy to this. Disp 30 This is an ACE inhibitor and then rov in a month

## 2012-12-08 NOTE — Telephone Encounter (Signed)
Left message on home number for the pt to return my call. 

## 2012-12-10 NOTE — Telephone Encounter (Signed)
Left message on home number for the pt to return my call. 

## 2012-12-14 ENCOUNTER — Other Ambulatory Visit: Payer: Self-pay | Admitting: Internal Medicine

## 2012-12-14 MED ORDER — LISINOPRIL 5 MG PO TABS: 5.0000 mg | ORAL_TABLET | Freq: Every day | ORAL | Status: DC

## 2012-12-14 NOTE — Telephone Encounter (Signed)
Spoke to the pt.  Instructed her to stop the amlodipine.  She will start lisinopril when she is feeling better.  Sent to [facility].

## 2012-12-29 HISTORY — PX: KNEE SURGERY: SHX244

## 2013-01-04 ENCOUNTER — Telehealth: Payer: Self-pay | Admitting: Family Medicine

## 2013-01-04 NOTE — Telephone Encounter (Signed)
Stop the medicine for a few days     And then can try 1/2 dose  Per day 2.5 mg  Per day  If tolerated

## 2013-01-04 NOTE — Telephone Encounter (Signed)
The pt is calling to inform you that the lisinopril is making her dizzy as well. She feels she is unstable to walk.  She has been taking it for 5-7 days.  She has an appointment with you on 01/18/13.  She would like to stop all medication until that time and discuss with you then.  Please advise.  Thanks!!

## 2013-01-05 NOTE — Telephone Encounter (Signed)
Pt notified by telephone. 

## 2013-01-18 ENCOUNTER — Encounter: Payer: Self-pay | Admitting: Internal Medicine

## 2013-01-18 ENCOUNTER — Ambulatory Visit (INDEPENDENT_AMBULATORY_CARE_PROVIDER_SITE_OTHER): Payer: Medicare Other | Admitting: Internal Medicine

## 2013-01-18 VITALS — BP 166/82 | HR 67 | Temp 97.8°F | Wt 124.0 lb

## 2013-01-18 DIAGNOSIS — T887XXA Unspecified adverse effect of drug or medicament, initial encounter: Secondary | ICD-10-CM

## 2013-01-18 DIAGNOSIS — I10 Essential (primary) hypertension: Secondary | ICD-10-CM | POA: Diagnosis not present

## 2013-01-18 MED ORDER — NEBIVOLOL HCL 5 MG PO TABS: 2.5000 mg | ORAL_TABLET | Freq: Every day | ORAL | Status: DC

## 2013-01-18 MED ORDER — RANITIDINE HCL 300 MG PO CAPS: 300.0000 mg | ORAL_CAPSULE | Freq: Two times a day (BID) | ORAL | Status: DC

## 2013-01-18 NOTE — Progress Notes (Signed)
Chief Complaint  Patient presents with  . Follow-up    HPI: Pt comes in forFu of Blood pressure   Having side effects of meds .  See last visit and phone note . Amlodipine; dizzy Lisinopril  5  Dizzy and unstable.   Better off med.  But bp is still high  Bp  150 160 plus range   Also had some left side discomfort that she had when had evaluation in past and was felt to be from Fosamax so went to every other week.   Sx wax and wane lase 15 minutes and no associated sx  Sob cough sweat nausea.  ROS: See pertinent positives and negatives per HPI. No cough   Past Medical History  Diagnosis Date  . GERD (gastroesophageal reflux disease)   . Hyperlipidemia   . Hypothyroidism   . Osteoarthritis   . Osteoporosis   . Episodic recurrent vertigo     MRI Head 2003  . Allergy   . Bladder polyps   . Subclavian steal syndrome     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; CTA 2013 revealed open graft  . Hyperplastic colon polyp   . Esophageal spasm   . History of hepatitis     unknown type  . Asymptomatic carotid artery stenosis     R ICA 40% stenosis on CTA 12/2011.  [name] Anemia   . Cataract     BILATERAL-REMOVED  . Elevated blood pressure 03/25/2011    Bp readings borderline today has hx of elvation  In office and ok at home   Not checke recently   She gfeels was elevated from anxiety      Family History  Problem Relation Age of Onset  . Hypertension Mother   . Heart disease Father   . Stroke Mother   . Colon cancer Neg Hx     History   Social History  . Marital Status: Widowed    Spouse Name: N/A    Number of Children: 4  . Years of Education: N/A   Occupational History  . retired    Social History Main Topics  . Smoking status: Former Smoker    Quit date: 01/05/1991  . Smokeless tobacco: Never Used  . Alcohol Use: 2.4 oz/week    4 Glasses of wine per week     Comment: socially  . Drug Use: No  . Sexually Active: None   Other Topics Concern  . None   Social  History Narrative   WidowedHH of 1 No petsFormer smokerExercises regularly    Outpatient Encounter Prescriptions as of 01/18/2013  Medication Sig Dispense Refill  . aspirin 325 MG tablet Take 325 mg by mouth daily.        [name] azelastine (ASTELIN) 137 MCG/SPRAY nasal spray Place 1 spray into the nose 2 (two) times daily. Use in each nostril as directed  30 mL  5  . calcium-vitamin D (OSCAL WITH D) 500-200 MG-UNIT per tablet Take 1 tablet by mouth daily.        . Cholecalciferol (VITAMIN D3) 1000 UNITS CAPS Take 1 capsule by mouth daily.       . diphenhydrAMINE (SOMINEX) 25 MG tablet Take 25 mg by mouth at bedtime as needed. For allergies and sleep      . docusate sodium (COLACE) 100 MG capsule Take 100 mg by mouth at bedtime.       [name] ezetimibe (ZETIA) 10 MG tablet Take 1 tablet (10 mg total) by mouth daily.  90 tablet  3  . fexofenadine (ALLEGRA) 180 MG tablet Take 180 mg by mouth daily.      . fish oil-omega-3 fatty acids 1000 MG capsule Take 1 g by mouth daily.       [name] glucosamine-chondroitin 500-400 MG tablet Take 1 tablet by mouth daily.       [name] ibuprofen (ADVIL,MOTRIN) 200 MG tablet Take 200 mg by mouth every 6 (six) hours as needed. Patient used this medication for a headache.      . levothyroxine (SYNTHROID) 88 MCG tablet Take 1 tablet (88 mcg total) by mouth daily.  30 tablet  11  . Melatonin 3 MG TABS Take 1 tablet by mouth every other day.       . multivitamin (THERAGRAN) per tablet Take 1 tablet by mouth daily.        . ranitidine (ZANTAC) 300 MG capsule Take 1 capsule (300 mg total) by mouth 2 (two) times daily.  180 capsule  3  . rosuvastatin (CRESTOR) 5 MG tablet Take 2.5 mg by mouth daily.        . [DISCONTINUED] ranitidine (ZANTAC) 300 MG capsule Take 1 capsule (300 mg total) by mouth 2 (two) times daily.  180 capsule  3  . nebivolol (BYSTOLIC) 5 MG tablet Take 0.5 tablets (2.5 mg total) by mouth daily.  30 tablet  0  . nitroGLYCERIN (NITROSTAT) 0.4 MG SL tablet Place 1 tablet  (0.4 mg total) under the tongue every 5 (five) minutes as needed for chest pain.  30 tablet  1  . [DISCONTINUED] estropipate (OGEN) 0.75 MG tablet Take 0.75 mg by mouth 3 (three) times a week. Take on Monday, Wednesday and Friday days 1-25      . [DISCONTINUED] lisinopril (PRINIVIL,ZESTRIL) 5 MG tablet Take 1 tablet (5 mg total) by mouth daily.  30 tablet  1  . [DISCONTINUED] medroxyPROGESTERone (PROVERA) 5 MG tablet Take 5 mg by mouth 3 (three) times a week. Take on Monday, Wednesday and Friday of days 13-25        EXAM:  BP 166/82  Pulse 67  Temp 97.8 F (36.6 C) (Oral)  Wt 124 lb (56.246 kg)  SpO2 95%  There is no height on file to calculate BMI.  GENERAL: vitals reviewed and listed above, alert, oriented, appears well hydrated and in no acute distress  HEENT: atraumatic, conjunctiva  clear, no obvious abnormalities on inspection of external nose and ears OP : no lesion edema or exudate   NECK: no obvious masses on inspection palpation   LUNGS: clear to auscultation bilaterally, no wheezes, rales or rhonchi, good air movement  CV: HRRR, no clubbing cyanosis or  peripheral edema nl cap refill   MS: moves all extremities oa djd changes gait seems steady and independent goes slow when getting up from chair   PSYCH: pleasant and cooperative, no obvious depression minimally anxiety  ASSESSMENT AND PLAN:  Discussed the following assessment and plan:  1. Hypertension    try low dose of bystolic;fu   2. Medication side effect    lisinopril /amlodipine,   Ok to take fosamax ever other week for now  Cause of ? Chest discomfort  consider change to ppi from ranitidine if needed  She is on high does and could cause se  Will discuss at fu visit  If needed -Patient advised to return or notify health care team  immediately if symptoms worsen or persist or new concerns arise.  Patient Instructions  Take 1/2 pill of  new medication  Daily   Can even take this  In the evening  If wish.    rov in 1 months but contact us if having problems with this.  [facility].     Neta Mends. Tahnee Cifuentes M.D.

## 2013-01-18 NOTE — Patient Instructions (Signed)
Take 1/2 pill of new medication  Daily   Can even take this  In the evening  If wish.  rov in 1 months but contact us if having problems with this.  [facility]idine [facility].

## 2013-01-24 ENCOUNTER — Other Ambulatory Visit: Payer: Self-pay | Admitting: Internal Medicine

## 2013-02-01 ENCOUNTER — Telehealth: Payer: Self-pay | Admitting: Family Medicine

## 2013-02-01 NOTE — Telephone Encounter (Signed)
I received a fax from from [facility].  Crestor is not a covered medication.  They would like to change it to atorvastatin, lovastatin, pravastatin or simvastatin.  Please advise.  Thanks!!!

## 2013-02-02 NOTE — Telephone Encounter (Signed)
She has a past hx of intolerance to many statins  And has been on low  Dose crestor for a while  It would be major changes and  Problem to change at this point   For now lets give her some samples of 5 mg if we have them   Please call patient  And see if she can remember which statin meds caused se . And if she has tried all of these medications listed    Info is probably documented  in the paper record somewhere but I dont have the record,  time  Or current  Ability to see this and answer this ? .   (Will plan lab work at next visit as she is due for lipid panel etc. )

## 2013-02-03 NOTE — Telephone Encounter (Signed)
Prior auth for [employer] initiated.

## 2013-02-03 NOTE — Telephone Encounter (Signed)
I called and spoke to the pt.  She cannot remember the names of the other statin medication she has tried in the past.  Rx is in the chart.  Received a fax that they do not want to pay for Crestor.

## 2013-02-28 ENCOUNTER — Encounter: Payer: Medicare Other | Admitting: Internal Medicine

## 2013-02-28 NOTE — Progress Notes (Signed)
Opened  For encounter canceled for weather concerns

## 2013-03-02 DIAGNOSIS — E039 Hypothyroidism, unspecified: Secondary | ICD-10-CM | POA: Diagnosis not present

## 2013-03-02 DIAGNOSIS — M810 Age-related osteoporosis without current pathological fracture: Secondary | ICD-10-CM | POA: Diagnosis not present

## 2013-03-08 ENCOUNTER — Telehealth: Payer: Self-pay | Admitting: Internal Medicine

## 2013-03-08 MED ORDER — NEBIVOLOL HCL 5 MG PO TABS: 2.5000 mg | ORAL_TABLET | Freq: Every day | ORAL | Status: DC

## 2013-03-08 NOTE — Telephone Encounter (Signed)
Sent by [facility]. 

## 2013-03-08 NOTE — Telephone Encounter (Signed)
Pt feels like the nebivolol (BYSTOLIC) 5 MG tablet is going to agree with her and would like a script sent into [facility] (90 day supply) .(Generic OK)

## 2013-03-22 ENCOUNTER — Ambulatory Visit (INDEPENDENT_AMBULATORY_CARE_PROVIDER_SITE_OTHER): Payer: Medicare Other | Admitting: Internal Medicine

## 2013-03-22 ENCOUNTER — Encounter: Payer: Self-pay | Admitting: Internal Medicine

## 2013-03-22 VITALS — BP 144/80 | HR 65 | Temp 98.5°F | Wt 124.0 lb

## 2013-03-22 DIAGNOSIS — G458 Other transient cerebral ischemic attacks and related syndromes: Secondary | ICD-10-CM

## 2013-03-22 DIAGNOSIS — E785 Hyperlipidemia, unspecified: Secondary | ICD-10-CM

## 2013-03-22 DIAGNOSIS — I10 Essential (primary) hypertension: Secondary | ICD-10-CM | POA: Diagnosis not present

## 2013-03-22 DIAGNOSIS — Z634 Disappearance and death of family member: Secondary | ICD-10-CM

## 2013-03-22 DIAGNOSIS — I779 Disorder of arteries and arterioles, unspecified: Secondary | ICD-10-CM

## 2013-03-22 NOTE — Progress Notes (Signed)
Chief Complaint  Patient presents with  . Follow-up  . Hypertension    HPI: Patient comes in today for follow up ofmedical problems.  Since her last visit she's been taking a 2.5 mg half of a 5 mg by systolic samples and had no significant side effects. Her blood pressure seems to be in a better range 1:30 to 140s. However last week had a major traumatic loss Bf of 19 years died last week .  Seems to be crying a lot but not hopeless she's been through this before when her husband died plans to begin walking again to help with grief    Now getting letters and the insurance company that they will no longer cover Crestor and we'll have to pay a lot of money to continue at a 1 her to switch to a generic to be covered She has a history of side effects of some statin medicines and she can't remember which except for  lipitor   Had se  bad muscle aches Uncertain? If took at pravachol.   Currently taking very low dose  .  crestor   1/2  Of 5  Mg  ROS: See pertinent positives and negatives per HPI. No current chest pain shortness of breath syncope.  Past Medical History  Diagnosis Date  . GERD (gastroesophageal reflux disease)   . Hyperlipidemia   . Hypothyroidism   . Osteoarthritis   . Osteoporosis   . Episodic recurrent vertigo     MRI Head 2003  . Allergy   . Bladder polyps   . Subclavian steal syndrome     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; CTA 2013 revealed open graft  . Hyperplastic colon polyp   . Esophageal spasm   . History of hepatitis     unknown type  . Asymptomatic carotid artery stenosis     R ICA 40% stenosis on CTA 12/2011.  [name] Anemia   . Cataract     BILATERAL-REMOVED  . Elevated blood pressure 03/25/2011    Bp readings borderline today has hx of elvation  In office and ok at home   Not checke recently   She gfeels was elevated from anxiety      Family History  Problem Relation Age of Onset  . Hypertension Mother   . Heart disease Father   . Stroke  Mother   . Colon cancer Neg Hx     History   Social History  . Marital Status: Widowed    Spouse Name: N/A    Number of Children: 4  . Years of Education: N/A   Occupational History  . retired    Social History Main Topics  . Smoking status: Former Smoker    Quit date: 01/05/1991  . Smokeless tobacco: Never Used  . Alcohol Use: 2.4 oz/week    4 Glasses of wine per week     Comment: socially  . Drug Use: No  . Sexually Active: None   Other Topics Concern  . None   Social History Narrative   Widowed   HH of 1    No pets   Former smoker   Exercises regularly    Outpatient Encounter Prescriptions as of 03/22/2013  Medication Sig Dispense Refill  . aspirin 325 MG tablet Take 325 mg by mouth daily.        [name] azelastine (ASTELIN) 137 MCG/SPRAY nasal spray Place 1 spray into the nose 2 (two) times daily. Use in each nostril as directed  30 mL  5  . calcium-vitamin D (OSCAL WITH D) 500-200 MG-UNIT per tablet Take 1 tablet by mouth daily.        . Cholecalciferol (VITAMIN D3) 1000 UNITS CAPS Take 1 capsule by mouth daily.       . CRESTOR 5 MG tablet TAKE 1/2 TABLET DAILY  45 tablet  2  . docusate sodium (COLACE) 100 MG capsule Take 100 mg by mouth at bedtime.       [name] ezetimibe (ZETIA) 10 MG tablet Take 1 tablet (10 mg total) by mouth daily.  90 tablet  3  . fexofenadine (ALLEGRA) 180 MG tablet Take 180 mg by mouth daily.      . fish oil-omega-3 fatty acids 1000 MG capsule Take 1 g by mouth daily.       [name] glucosamine-chondroitin 500-400 MG tablet Take 1 tablet by mouth daily.       [name] ibuprofen (ADVIL,MOTRIN) 200 MG tablet Take 200 mg by mouth every 6 (six) hours as needed. Patient used this medication for a headache.      . levothyroxine (SYNTHROID) 88 MCG tablet Take 1 tablet (88 mcg total) by mouth daily.  30 tablet  11  . Melatonin 3 MG TABS Take 1 tablet by mouth every other day.       . multivitamin (THERAGRAN) per tablet Take 1 tablet by mouth daily.        . nebivolol  (BYSTOLIC) 5 MG tablet Take 0.5 tablets (2.5 mg total) by mouth daily.  90 tablet  0  . ranitidine (ZANTAC) 300 MG capsule Take 1 capsule (300 mg total) by mouth 2 (two) times daily.  180 capsule  3  . nitroGLYCERIN (NITROSTAT) 0.4 MG SL tablet Place 1 tablet (0.4 mg total) under the tongue every 5 (five) minutes as needed for chest pain.  30 tablet  1  . [DISCONTINUED] diphenhydrAMINE (SOMINEX) 25 MG tablet Take 25 mg by mouth at bedtime as needed. For allergies and sleep       No facility-administered encounter medications on file as of 03/22/2013.    EXAM:  BP 144/80  Pulse 65  Temp(Src) 98.5 F (36.9 C) (Oral)  Wt 124 lb (56.246 kg)  BMI 22.67 kg/m2  SpO2 96%  Body mass index is 22.67 kg/(m^2).  GENERAL: vitals reviewed and listed above, alert, oriented, appears well hydrated and in no acute distress normal cognition a little bit sad but totally appropriate  HEENT: atraumatic, conjunctiva  clear, no obvious abnormalities on inspection of external nose and ears  NECK: no obvious masses on inspection palpation   LUNGS: clear to auscultation bilaterally, no wheezes, rales or rhonchi, good air movement Repeat blood pressure confirmed 140/60 right arm CV: HRRR, no clubbing cyanosis or  peripheral edema nl cap refill   MS: moves all extremities without noticeable focal  Abnormality  though a changes gait independent  PSYCH: pleasant and cooperative,  Lab Results  Component Value Date   WBC 6.1 07/13/2012   HGB 13.8 07/13/2012   HCT 41.6 07/13/2012   PLT 245.0 07/13/2012   GLUCOSE 92 07/13/2012   CHOL 178 09/23/2011   TRIG 139.0 09/23/2011   HDL 67.10 09/23/2011   LDLCALC 83 09/23/2011   ALT 16 11/21/2011   AST 21 11/21/2011   NA 141 07/13/2012   K 4.9 07/13/2012   CL 105 07/13/2012   CREATININE 0.8 07/13/2012   BUN 17 07/13/2012   CO2 28 07/13/2012   TSH 4.62 07/20/2012   HGBA1C 5.3  09/23/2011    ASSESSMENT AND PLAN:  Discussed the following assessment and plan:  Hypertension,  isolated systolic - better on low dose bystolic to continue samples for now.    HYPERLIPIDEMIA - unfortunately insurance issues  and intolerant to many emds  will deal with this later gave samples today  Subclavian steal syndrome  Carotid artery disease without cerebral infarction  Bereavement - disc  follow  nl reaction for situation .  Samples to remain on same meds for now as bp is the better without se noted.  Will have to review recod and se if pravachol although not as powerful was tolerated .  -Patient advised to return or notify health care team  if symptoms worsen or persist or new concerns arise.  Patient Instructions  Blood pressure is better .  Stay on samples for now  rov in 2 months  And will discuss chang meds  Neta Mends. Ricky Doan M.D.  Total visit > 50% spent counseling and coordinating care

## 2013-03-22 NOTE — Patient Instructions (Signed)
Blood pressure is better .  Stay on samples for now  rov in 2 months  And will discuss chang meds

## 2013-04-20 ENCOUNTER — Encounter: Payer: Self-pay | Admitting: Internal Medicine

## 2013-04-20 ENCOUNTER — Ambulatory Visit (INDEPENDENT_AMBULATORY_CARE_PROVIDER_SITE_OTHER): Payer: Medicare Other | Admitting: Internal Medicine

## 2013-04-20 VITALS — BP 142/76 | HR 76 | Temp 97.9°F | Wt 123.0 lb

## 2013-04-20 DIAGNOSIS — J988 Other specified respiratory disorders: Secondary | ICD-10-CM

## 2013-04-20 DIAGNOSIS — J019 Acute sinusitis, unspecified: Secondary | ICD-10-CM | POA: Insufficient documentation

## 2013-04-20 DIAGNOSIS — J22 Unspecified acute lower respiratory infection: Secondary | ICD-10-CM | POA: Insufficient documentation

## 2013-04-20 MED ORDER — BENZONATATE 100 MG PO CAPS: 100.0000 mg | ORAL_CAPSULE | Freq: Three times a day (TID) | ORAL | Status: DC | PRN

## 2013-04-20 MED ORDER — AMOXICILLIN-POT CLAVULANATE 875-125 MG PO TABS: 1.0000 | ORAL_TABLET | Freq: Two times a day (BID) | ORAL | Status: DC

## 2013-04-20 MED ORDER — ALBUTEROL SULFATE HFA 108 (90 BASE) MCG/ACT IN AERS: 2.0000 | INHALATION_SPRAY | Freq: Four times a day (QID) | RESPIRATORY_TRACT | Status: DC | PRN

## 2013-04-20 NOTE — Progress Notes (Signed)
Chief Complaint  Patient presents with  . Cough    Started 10 days ago.  She says she is not doing a good job of getting rid of it on her own.  . Nasal Congestion  . Headache    HPI: Patient comes in today for SDA for  new problem evaluation. Here with her daughter. Onset about 10+ days ago of fever chills respiratory congestion head cold symptoms. Since that time her fever has gone away but she feels achy a lot of the time most recently she's had severe face pressure teeth hurting him worse when she bends over she is a bit better today last week she was wheezing although not today. Her cough is difficult at night and keeping her awake has tried over-the-counter expectorant and cold medicines decongestants. Still coughs at night. Not particularly short of breath in the day.  Currently she has persistent facial pressure when she bends over. No nausea vomiting but decreased appetite.  ROS: See pertinent positives and negatives per HPI. No syncope vision change cognitive change or falling she does state her balance is off a little bit  Past Medical History  Diagnosis Date  . GERD (gastroesophageal reflux disease)   . Hyperlipidemia   . Hypothyroidism   . Osteoarthritis   . Osteoporosis   . Episodic recurrent vertigo     MRI Head 2003  . Allergy   . Bladder polyps   . Subclavian steal syndrome     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; CTA 2013 revealed open graft  . Hyperplastic colon polyp   . Esophageal spasm   . History of hepatitis     unknown type  . Asymptomatic carotid artery stenosis     R ICA 40% stenosis on CTA 12/2011.  [name] Anemia   . Cataract     BILATERAL-REMOVED  . Elevated blood pressure 03/25/2011    Bp readings borderline today has hx of elvation  In office and ok at home   Not checke recently   She gfeels was elevated from anxiety      Family History  Problem Relation Age of Onset  . Hypertension Mother   . Heart disease Father   . Stroke Mother   .  Colon cancer Neg Hx     History   Social History  . Marital Status: Widowed    Spouse Name: N/A    Number of Children: 4  . Years of Education: N/A   Occupational History  . retired    Social History Main Topics  . Smoking status: Former Smoker    Quit date: 01/05/1991  . Smokeless tobacco: Never Used  . Alcohol Use: 2.4 oz/week    4 Glasses of wine per week     Comment: socially  . Drug Use: No  . Sexually Active: None   Other Topics Concern  . None   Social History Narrative   Widowed   HH of 1    No pets   Former smoker   Exercises regularly    Outpatient Encounter Prescriptions as of 04/20/2013  Medication Sig Dispense Refill  . aspirin 325 MG tablet Take 325 mg by mouth daily.        [name] azelastine (ASTELIN) 137 MCG/SPRAY nasal spray Place 1 spray into the nose 2 (two) times daily. Use in each nostril as directed  30 mL  5  . calcium-vitamin D (OSCAL WITH D) 500-200 MG-UNIT per tablet Take 1 tablet by mouth daily.        [name]  Cholecalciferol (VITAMIN D3) 1000 UNITS CAPS Take 1 capsule by mouth daily.       . CRESTOR 5 MG tablet TAKE 1/2 TABLET DAILY  45 tablet  2  . docusate sodium (COLACE) 100 MG capsule Take 100 mg by mouth at bedtime.       [name] ezetimibe (ZETIA) 10 MG tablet Take 1 tablet (10 mg total) by mouth daily.  90 tablet  3  . fexofenadine (ALLEGRA) 180 MG tablet Take 180 mg by mouth daily.      . fish oil-omega-3 fatty acids 1000 MG capsule Take 1 g by mouth daily.       [name] glucosamine-chondroitin 500-400 MG tablet Take 1 tablet by mouth daily.       [name] ibuprofen (ADVIL,MOTRIN) 200 MG tablet Take 200 mg by mouth every 6 (six) hours as needed. Patient used this medication for a headache.      . levothyroxine (SYNTHROID) 88 MCG tablet Take 1 tablet (88 mcg total) by mouth daily.  30 tablet  11  . Melatonin 3 MG TABS Take 1 tablet by mouth every other day.       . multivitamin (THERAGRAN) per tablet Take 1 tablet by mouth daily.        . nebivolol (BYSTOLIC) 5  MG tablet Take 0.5 tablets (2.5 mg total) by mouth daily.  90 tablet  0  . ranitidine (ZANTAC) 300 MG capsule Take 1 capsule (300 mg total) by mouth 2 (two) times daily.  180 capsule  3  . albuterol (PROAIR HFA) 108 (90 BASE) MCG/ACT inhaler Inhale 2 puffs into the lungs every 6 (six) hours as needed for wheezing.  1 Inhaler  1  . amoxicillin-clavulanate (AUGMENTIN) 875-125 MG per tablet Take 1 tablet by mouth every 12 (twelve) hours.  20 tablet  0  . benzonatate (TESSALON) 100 MG capsule Take 1 capsule (100 mg total) by mouth 3 (three) times daily as needed for cough.  21 capsule  0  . nitroGLYCERIN (NITROSTAT) 0.4 MG SL tablet Place 1 tablet (0.4 mg total) under the tongue every 5 (five) minutes as needed for chest pain.  30 tablet  1   No facility-administered encounter medications on file as of 04/20/2013.    EXAM:  BP 142/76  Pulse 76  Temp(Src) 97.9 F (36.6 C) (Oral)  Wt 123 lb (55.792 kg)  BMI 22.49 kg/m2  SpO2 98%  Body mass index is 22.49 kg/(m^2). WDWN in NAD  quiet respirations; mildly congested  somewhat hoarse. Non toxic .doestn feel well  HEENT: Normocephalic ;atraumatic , Eyes;  PERRL, EOMs  Full, lids and conjunctiva clear,,Ears: no deformities, canals nl, TM landmarks normal, Nose: no deformity mucoid dc  but congested;face minimally tender Mouth : OP clear without lesion or edema. Neck: Supple without adenopathy or masses or bruits Chest:  Clear to A&P without wheezes rales or rhonchi CV:  S1-S2 no gallops or murmurs peripheral perfusion is normal Skin :nl perfusion and no acute rashes  MS: moves all extremities without noticeable focal  Abnormality OA changes   PSYCH: pleasant and cooperative, no obvious depression or anxiety  ASSESSMENT AND PLAN:  Discussed the following assessment and plan:  Acute sinusitis with symptoms greater than 10 days - Continued symptoms discuss gross benefit of medicine to wait a few days add antibiotic side effects discussed  Acute  respiratory infection - Elements of potential wheezy bronchitis probably viral but high risk we'll treat for bacterial sinusitis  -Patient advised to return or notify health  care team  if symptoms worsen or persist or new concerns arise.  Patient Instructions  This acts like a sinusitis that may get better quicker with antibiotic intervention. The primary illness is probably viral.  Because we're in peak allergy season this could also be aggravating Okay to continue nasal saline. This can help decongest and thin mucus. Your lungs sound clear today in your oxygenation is good.  Begin antibiotic one twice a day for 10 days. You can take yogurt or probiotic to try to prevent diarrhea.  Tessalon Perles as a cough suppressant that you can use at night or as needed take care with possible dizziness sedation as a side effect.  He may also use an inhaler if needed when your chest feels like your wheezing. Expect you to improve in the next 3-5 days although cough may last for another week or 2 contact medical team if you're not improving or other concerns.     Neta Mends. Urho Rio M.D.

## 2013-04-20 NOTE — Patient Instructions (Addendum)
This acts like a sinusitis that may get better quicker with antibiotic intervention. The primary illness is probably viral.  Because we're in peak allergy season this could also be aggravating Okay to continue nasal saline. This can help decongest and thin mucus. Your lungs sound clear today in your oxygenation is good.  Begin antibiotic one twice a day for 10 days. You can take yogurt or probiotic to try to prevent diarrhea.  Tessalon Perles as a cough suppressant that you can use at night or as needed take care with possible dizziness sedation as a side effect.  He may also use an inhaler if needed when your chest feels like your wheezing. Expect you to improve in the next 3-5 days although cough may last for another week or 2 contact medical team if you're not improving or other concerns.

## 2013-05-15 ENCOUNTER — Other Ambulatory Visit: Payer: Self-pay | Admitting: Internal Medicine

## 2013-05-19 DIAGNOSIS — Z961 Presence of intraocular lens: Secondary | ICD-10-CM | POA: Diagnosis not present

## 2013-05-24 ENCOUNTER — Ambulatory Visit (INDEPENDENT_AMBULATORY_CARE_PROVIDER_SITE_OTHER): Payer: Medicare Other | Admitting: Internal Medicine

## 2013-05-24 ENCOUNTER — Encounter: Payer: Self-pay | Admitting: Internal Medicine

## 2013-05-24 VITALS — BP 132/72 | HR 63 | Temp 98.0°F | Wt 120.0 lb

## 2013-05-24 DIAGNOSIS — E039 Hypothyroidism, unspecified: Secondary | ICD-10-CM

## 2013-05-24 DIAGNOSIS — Z87898 Personal history of other specified conditions: Secondary | ICD-10-CM

## 2013-05-24 DIAGNOSIS — M1990 Unspecified osteoarthritis, unspecified site: Secondary | ICD-10-CM

## 2013-05-24 DIAGNOSIS — E785 Hyperlipidemia, unspecified: Secondary | ICD-10-CM | POA: Diagnosis not present

## 2013-05-24 DIAGNOSIS — K219 Gastro-esophageal reflux disease without esophagitis: Secondary | ICD-10-CM

## 2013-05-24 DIAGNOSIS — R7309 Other abnormal glucose: Secondary | ICD-10-CM

## 2013-05-24 DIAGNOSIS — I10 Essential (primary) hypertension: Secondary | ICD-10-CM | POA: Diagnosis not present

## 2013-05-24 LAB — CBC WITH DIFFERENTIAL/PLATELET
Basophils Absolute: 0 10*3/uL (ref 0.0–0.1)
Basophils Relative: 0.3 % (ref 0.0–3.0)
Hemoglobin: 13.7 g/dL (ref 12.0–15.0)
Lymphocytes Relative: 19.4 % (ref 12.0–46.0)
Monocytes Relative: 7.9 % (ref 3.0–12.0)
Neutro Abs: 5.5 10*3/uL (ref 1.4–7.7)
RBC: 4.21 Mil/uL (ref 3.87–5.11)
RDW: 14.3 % (ref 11.5–14.6)

## 2013-05-24 LAB — HEMOGLOBIN A1C: Hgb A1c MFr Bld: 5.5 % (ref 4.6–6.5)

## 2013-05-24 LAB — HEPATIC FUNCTION PANEL
AST: 18 U/L (ref 0–37)
Albumin: 3.6 g/dL (ref 3.5–5.2)
Alkaline Phosphatase: 52 U/L (ref 39–117)
Bilirubin, Direct: 0.1 mg/dL (ref 0.0–0.3)

## 2013-05-24 LAB — LIPID PANEL
HDL: 66.2 mg/dL (ref >39.00)
LDL Cholesterol: 68 mg/dL (ref 0–99)
Total CHOL/HDL Ratio: 2

## 2013-05-24 LAB — BASIC METABOLIC PANEL
Calcium: 9.5 mg/dL (ref 8.4–10.5)
GFR: 69.92 mL/min (ref >60.00)
Sodium: 142 mEq/L (ref 135–145)

## 2013-05-24 MED ORDER — NEBIVOLOL HCL 5 MG PO TABS: 2.5000 mg | ORAL_TABLET | Freq: Every day | ORAL | Status: DC

## 2013-05-24 MED ORDER — ROSUVASTATIN CALCIUM 5 MG PO TABS: ORAL_TABLET | ORAL | Status: DC

## 2013-05-24 MED ORDER — RANITIDINE HCL 300 MG PO CAPS: 300.0000 mg | ORAL_CAPSULE | Freq: Two times a day (BID) | ORAL | Status: DC

## 2013-05-24 NOTE — Progress Notes (Signed)
Chief Complaint  Patient presents with  . Follow-up  . Hypertension    HPI: Patient comes in today for follow up of  multiple medical problems.   BP  148  Range at home.  No serious se of med reported at this time   LIPIDS   Still taking  Low dose crestor and  zetia   Had episode  vomiting last night     Used to get this with gall stones but gb out  Now happens isolated  about 3-4 months .  Then is fine  ? If eating something unsettling  No assoc sx  With this and no sig pain  RTI better  Took augmentin at least 7 days  And then stopped cause got bad diarrhea  Has recovered from that.   No falling balance always an issue ROS: See pertinent positives and negatives per HPI.  Past Medical History  Diagnosis Date  . GERD (gastroesophageal reflux disease)   . Hyperlipidemia   . Hypothyroidism   . Osteoarthritis   . Osteoporosis   . Episodic recurrent vertigo     MRI Head 2003  . Allergy   . Bladder polyps   . Subclavian steal syndrome     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; CTA 2013 revealed open graft  . Hyperplastic colon polyp   . Esophageal spasm   . History of hepatitis     unknown type  . Asymptomatic carotid artery stenosis     R ICA 40% stenosis on CTA 12/2011.  [name] Anemia   . Cataract     BILATERAL-REMOVED  . Elevated blood pressure 03/25/2011    Bp readings borderline today has hx of elvation  In office and ok at home   Not checke recently   She gfeels was elevated from anxiety      Family History  Problem Relation Age of Onset  . Hypertension Mother   . Heart disease Father   . Stroke Mother   . Colon cancer Neg Hx     History   Social History  . Marital Status: Widowed    Spouse Name: N/A    Number of Children: 4  . Years of Education: N/A   Occupational History  . retired    Social History Main Topics  . Smoking status: Former Smoker    Quit date: 01/05/1991  . Smokeless tobacco: Never Used  . Alcohol Use: 2.4 oz/week    4 Glasses of  wine per week     Comment: socially  . Drug Use: No  . Sexually Active: None   Other Topics Concern  . None   Social History Narrative   Widowed   HH of 1    No pets   Former smoker   Exercises regularly    Outpatient Encounter Prescriptions as of 05/24/2013  Medication Sig Dispense Refill  . aspirin 325 MG tablet Take 325 mg by mouth daily.        [name] azelastine (ASTELIN) 137 MCG/SPRAY nasal spray Place 1 spray into the nose 2 (two) times daily. Use in each nostril as directed  30 mL  5  . calcium-vitamin D (OSCAL WITH D) 500-200 MG-UNIT per tablet Take 1 tablet by mouth daily.        . Cholecalciferol (VITAMIN D3) 1000 UNITS CAPS Take 1 capsule by mouth daily.       [name] docusate sodium (COLACE) 100 MG capsule Take 100 mg by mouth at bedtime.       [name]  fexofenadine (ALLEGRA) 180 MG tablet Take 180 mg by mouth daily.      . fish oil-omega-3 fatty acids 1000 MG capsule Take 1 g by mouth daily.       [name] glucosamine-chondroitin 500-400 MG tablet Take 1 tablet by mouth daily.       [name] ibuprofen (ADVIL,MOTRIN) 200 MG tablet Take 200 mg by mouth every 6 (six) hours as needed. Patient used this medication for a headache.      . levothyroxine (SYNTHROID) 88 MCG tablet Take 1 tablet (88 mcg total) by mouth daily.  30 tablet  11  . Melatonin 3 MG TABS Take 1 tablet by mouth every other day.       . multivitamin (THERAGRAN) per tablet Take 1 tablet by mouth daily.        . nebivolol (BYSTOLIC) 5 MG tablet Take 0.5 tablets (2.5 mg total) by mouth daily.  90 tablet  0  . ranitidine (ZANTAC) 300 MG capsule Take 1 capsule (300 mg total) by mouth 2 (two) times daily.  180 capsule  1  . rosuvastatin (CRESTOR) 5 MG tablet TAKE 1/2 TABLET DAILY  45 tablet  1  . ZETIA 10 MG tablet TAKE 1 TABLET DAILY  90 tablet  0  . [DISCONTINUED] CRESTOR 5 MG tablet TAKE 1/2 TABLET DAILY  45 tablet  2  . [DISCONTINUED] nebivolol (BYSTOLIC) 5 MG tablet Take 0.5 tablets (2.5 mg total) by mouth daily.  90 tablet  0  .  [DISCONTINUED] ranitidine (ZANTAC) 300 MG capsule Take 1 capsule (300 mg total) by mouth 2 (two) times daily.  180 capsule  3  . nitroGLYCERIN (NITROSTAT) 0.4 MG SL tablet Place 1 tablet (0.4 mg total) under the tongue every 5 (five) minutes as needed for chest pain.  30 tablet  1  . [DISCONTINUED] albuterol (PROAIR HFA) 108 (90 BASE) MCG/ACT inhaler Inhale 2 puffs into the lungs every 6 (six) hours as needed for wheezing.  1 Inhaler  1  . [DISCONTINUED] amoxicillin-clavulanate (AUGMENTIN) 875-125 MG per tablet Take 1 tablet by mouth every 12 (twelve) hours.  20 tablet  0  . [DISCONTINUED] benzonatate (TESSALON) 100 MG capsule Take 1 capsule (100 mg total) by mouth 3 (three) times daily as needed for cough.  21 capsule  0   No facility-administered encounter medications on file as of 05/24/2013.    EXAM:  BP 132/72  Pulse 63  Temp(Src) 98 F (36.7 C) (Oral)  Wt 120 lb (54.432 kg)  BMI 21.94 kg/m2  SpO2 98%  Body mass index is 21.94 kg/(m^2).  GENERAL: vitals reviewed and listed above, alert, oriented, appears well hydrated and in no acute distress  HEENT: atraumatic, conjunctiva  clear, no obvious abnormalities on inspection of external nose and ears OP : no lesion edema or exudate   NECK: no obvious masses on inspection palpation   LUNGS: clear to auscultation bilaterally, no wheezes, rales or rhonchi, good air movement Abdomen:  Sof,t normal bowel sounds without hepatosplenomegaly, no guarding rebound or masses no CVA tenderness No bruit  CV: HRRR, no clubbing cyanosis or  peripheral edema nl cap refill   MS: moves all extremities without noticeable focal  Abnormality  OA DJD changes   PSYCH: pleasant and cooperative, no obvious depression or anxiety Lab Results  Component Value Date   WBC 7.7 05/24/2013   HGB 13.7 05/24/2013   HCT 40.5 05/24/2013   PLT 190.0 05/24/2013   GLUCOSE 94 05/24/2013   CHOL 150 05/24/2013   TRIG  78.0 05/24/2013   HDL 66.20 05/24/2013   LDLCALC 68  05/24/2013   ALT 16 05/24/2013   AST 18 05/24/2013   NA 142 05/24/2013   K 3.9 05/24/2013   CL 108 05/24/2013   CREATININE 0.8 05/24/2013   BUN 20 05/24/2013   CO2 25 05/24/2013   TSH 0.91 05/24/2013   HGBA1C 5.5 05/24/2013    ASSESSMENT AND PLAN:  Discussed the following assessment and plan:  Hypertension, isolated systolic - better  today  - Plan: Basic metabolic panel, CBC with Differential, Hepatic function panel, TSH, Lipid panel, Hemoglobin A1c  HYPOTHYROIDISM - Plan: Basic metabolic panel, CBC with Differential, Hepatic function panel, TSH, Lipid panel, Hemoglobin A1c  HYPERLIPIDEMIA - Plan: Basic metabolic panel, CBC with Differential, Hepatic function panel, TSH, Lipid panel, Hemoglobin A1c  OSTEOARTHRITIS - Plan: Basic metabolic panel, CBC with Differential, Hepatic function panel, TSH, Lipid panel, Hemoglobin A1c  GERD - Plan: Basic metabolic panel, CBC with Differential, Hepatic function panel, TSH, Lipid panel, Hemoglobin A1c  HYPERGLYCEMIA - Plan: Basic metabolic panel, CBC with Differential, Hepatic function panel, TSH, Lipid panel, Hemoglobin A1c  Hx of vomiting - isolated episodes  and then resolves  will follow Due for lab  Monitoring for disease states in the next month so  Will c heck today  And refill meds if ok  Stay on same meds for now -Patient advised to return or notify health care team  if symptoms worsen or persist or new concerns arise.  Patient Instructions  Your blood pressure is good today. No change in medicine at this time.  The antibiotic you're on causes diarrhea isn't an allergy it is just a side effect.  Uncertain why you have and occasional vomiting it could be a food that disagreed with you but your exam is okay today.  We'll get routine lab screening followup today and let you know those results. Contact us when refills are 2.   Will plan followup visit in about 4 months or earlier depending on lab reports.   Neta Mends. Panosh M.D.

## 2013-05-24 NOTE — Patient Instructions (Signed)
Your blood pressure is good today. No change in medicine at this time.  The antibiotic you're on causes diarrhea isn't an allergy it is just a side effect.  Uncertain why you have and occasional vomiting it could be a food that disagreed with you but your exam is okay today.  We'll get routine lab screening followup today and let you know those results. Contact us when refills are 2.   Will plan followup visit in about 4 months or earlier depending on lab reports.

## 2013-05-25 NOTE — Progress Notes (Signed)
Quick Note:  Called and spoke with pt and pt is aware. ______ 

## 2013-05-26 DIAGNOSIS — Z87898 Personal history of other specified conditions: Secondary | ICD-10-CM | POA: Insufficient documentation

## 2013-06-15 ENCOUNTER — Other Ambulatory Visit: Payer: Self-pay | Admitting: Internal Medicine

## 2013-06-15 NOTE — Telephone Encounter (Signed)
Pt needs refill of nebivolol (BYSTOLIC) 5 MG tablet. She never received  previous refill request. Pt is almost out of this med, and will not have eniough before it arrives from [facility]. Pt would like 30 days sent to [facility] [address] Then pt request 90 days from [facility]

## 2013-06-28 ENCOUNTER — Other Ambulatory Visit: Payer: Self-pay | Admitting: Family Medicine

## 2013-06-28 MED ORDER — NEBIVOLOL HCL 5 MG PO TABS: 2.5000 mg | ORAL_TABLET | Freq: Every day | ORAL | Status: DC

## 2013-06-29 ENCOUNTER — Other Ambulatory Visit: Payer: Self-pay | Admitting: Internal Medicine

## 2013-07-13 ENCOUNTER — Telehealth: Payer: Self-pay | Admitting: Internal Medicine

## 2013-07-13 MED ORDER — NEBIVOLOL HCL 5 MG PO TABS: 2.5000 mg | ORAL_TABLET | Freq: Every day | ORAL | Status: DC

## 2013-07-13 NOTE — Telephone Encounter (Signed)
Pt had received samples of BYSTOLIC from Dr Fabian Sharp. Pt states she is only able to take 1/2 tab once daily. However, she states she now needs a rx for the BYSTOLIC. I see we sent on to [facility] mail order in March - they are telling [facility] they have no rx on file. Pt states she has left you a couple of voice mails - I advised her that calling your extension directly was probably not the best way, and to let me send an electronic message. Pt requesting a 90-day rx for BYSTOLIC 5mg  (she takes 1/2 tab per day) to [facility] mail order.

## 2013-07-13 NOTE — Telephone Encounter (Signed)
I spoke with Dr Fabian Sharp; she said ok to give [facility] samples. I set 2 bottles aside for her to pick up. This will get her through 4 wks.

## 2013-07-13 NOTE — Telephone Encounter (Signed)
Sent by [facility]. 

## 2013-07-28 ENCOUNTER — Other Ambulatory Visit: Payer: Self-pay | Admitting: Internal Medicine

## 2013-07-29 DIAGNOSIS — L723 Sebaceous cyst: Secondary | ICD-10-CM | POA: Diagnosis not present

## 2013-07-29 DIAGNOSIS — L821 Other seborrheic keratosis: Secondary | ICD-10-CM | POA: Diagnosis not present

## 2013-07-29 DIAGNOSIS — L570 Actinic keratosis: Secondary | ICD-10-CM | POA: Diagnosis not present

## 2013-08-18 DIAGNOSIS — L723 Sebaceous cyst: Secondary | ICD-10-CM | POA: Diagnosis not present

## 2013-09-15 DIAGNOSIS — Z1231 Encounter for screening mammogram for malignant neoplasm of breast: Secondary | ICD-10-CM | POA: Diagnosis not present

## 2013-10-04 ENCOUNTER — Ambulatory Visit (INDEPENDENT_AMBULATORY_CARE_PROVIDER_SITE_OTHER): Payer: Medicare Other | Admitting: Internal Medicine

## 2013-10-04 ENCOUNTER — Encounter: Payer: Self-pay | Admitting: Internal Medicine

## 2013-10-04 VITALS — BP 130/70 | HR 55 | Temp 97.8°F | Wt 120.0 lb

## 2013-10-04 DIAGNOSIS — Z87898 Personal history of other specified conditions: Secondary | ICD-10-CM | POA: Diagnosis not present

## 2013-10-04 DIAGNOSIS — J310 Chronic rhinitis: Secondary | ICD-10-CM | POA: Diagnosis not present

## 2013-10-04 DIAGNOSIS — R1110 Vomiting, unspecified: Secondary | ICD-10-CM | POA: Diagnosis not present

## 2013-10-04 DIAGNOSIS — I10 Essential (primary) hypertension: Secondary | ICD-10-CM | POA: Diagnosis not present

## 2013-10-04 NOTE — Patient Instructions (Signed)
Trial   Of nasal  Cortisone   such as nasacort ( this is OTC)   2 SPRAYS per nostril each day   For poss allergy.  If not helping after 2 weeks than can stop  blood pressure is good today.  Continue. Uncertain why you are having  Intermittent nocturnal vomiting .      Will arrange for dr Juanda Chance to see you and give advice about intervention evaluation.    WEllness check and labs  Next MAY

## 2013-10-04 NOTE — Progress Notes (Signed)
Chief Complaint  Patient presents with  . Follow-up  . Hypertension    HPI: Fu Had bad head cold and no fever  Took 3 weeks to get better  . Had stopped allegra and it helped.  A lot.  BP no checking much   No change  No known se of med GI vomiting x 1 ocass .    Similar  To when had gall bladder issues  But occurs nocturnal out of th blue and then subsides  Cant take ppi in past had se   Seems faily well in between  ROS: See pertinent positives and negatives per HPI. Drippy nose impr with antihistamine  astelin not that helpful no syncope bleeding diarrhea   Past Medical History  Diagnosis Date  . GERD (gastroesophageal reflux disease)   . Hyperlipidemia   . Hypothyroidism   . Osteoarthritis   . Osteoporosis   . Episodic recurrent vertigo     MRI Head 2003  . Allergy   . Bladder polyps   . Subclavian steal syndrome     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; CTA 2013 revealed open graft  . Hyperplastic colon polyp   . Esophageal spasm   . History of hepatitis     unknown type  . Asymptomatic carotid artery stenosis     R ICA 40% stenosis on CTA 12/2011.  [name] Anemia   . Cataract     BILATERAL-REMOVED  . Elevated blood pressure 03/25/2011    Bp readings borderline today has hx of elvation  In office and ok at home   Not checke recently   She gfeels was elevated from anxiety      Family History  Problem Relation Age of Onset  . Hypertension Mother   . Heart disease Father   . Stroke Mother   . Colon cancer Neg Hx     History   Social History  . Marital Status: Widowed    Spouse Name: N/A    Number of Children: 4  . Years of Education: N/A   Occupational History  . retired    Social History Main Topics  . Smoking status: Former Smoker    Quit date: 01/05/1991  . Smokeless tobacco: Never Used  . Alcohol Use: 2.4 oz/week    4 Glasses of wine per week     Comment: socially  . Drug Use: No  . Sexual Activity: None   Other Topics Concern  . None    Social History Narrative   Widowed   HH of 1    No pets   Former smoker   Exercises regularly    Outpatient Encounter Prescriptions as of 10/04/2013  Medication Sig Dispense Refill  . aspirin 325 MG tablet Take 325 mg by mouth daily.        [name] azelastine (ASTELIN) 137 MCG/SPRAY nasal spray USE ONE SPRAY IN EACH NOSTRIL TWICE A DAY AS DIRECTED  2 mL  3  . calcium-vitamin D (OSCAL WITH D) 500-200 MG-UNIT per tablet Take 1 tablet by mouth daily.        . Cholecalciferol (VITAMIN D3) 1000 UNITS CAPS Take 1 capsule by mouth daily.       [name] docusate sodium (COLACE) 100 MG capsule Take 100 mg by mouth at bedtime.       . fexofenadine (ALLEGRA) 180 MG tablet Take 180 mg by mouth daily.      . fish oil-omega-3 fatty acids 1000 MG capsule Take 1 g by mouth  daily.       . glucosamine-chondroitin 500-400 MG tablet Take 1 tablet by mouth daily.       [name] ibuprofen (ADVIL,MOTRIN) 200 MG tablet Take 200 mg by mouth every 6 (six) hours as needed. Patient used this medication for a headache.      . levothyroxine (SYNTHROID, LEVOTHROID) 88 MCG tablet TAKE 1 TABLET DAILY  90 tablet  3  . Melatonin 3 MG TABS Take 1 tablet by mouth every other day.       . multivitamin (THERAGRAN) per tablet Take 1 tablet by mouth daily.        . nebivolol (BYSTOLIC) 5 MG tablet Take 0.5 tablets (2.5 mg total) by mouth daily.  45 tablet  1  . ranitidine (ZANTAC) 300 MG capsule Take 1 capsule (300 mg total) by mouth 2 (two) times daily.  180 capsule  1  . rosuvastatin (CRESTOR) 5 MG tablet TAKE 1/2 TABLET DAILY  45 tablet  1  . ZETIA 10 MG tablet TAKE 1 TABLET DAILY  90 tablet  0  . nitroGLYCERIN (NITROSTAT) 0.4 MG SL tablet Place 1 tablet (0.4 mg total) under the tongue every 5 (five) minutes as needed for chest pain.  30 tablet  1   No facility-administered encounter medications on file as of 10/04/2013.    EXAM:  BP 130/70  Pulse 55  Temp(Src) 97.8 F (36.6 C) (Oral)  Wt 120 lb (54.432 kg)  BMI 21.94 kg/m2  SpO2  98%  Body mass index is 21.94 kg/(m^2).  GENERAL: vitals reviewed and listed above, alert, oriented, appears well hydrated and in no acute distress HEENT: atraumatic, conjunctiva  clear, no obvious abnormalities on inspection of external nose and ears  congested OP : no lesion edema or exudate \ NECK: no obvious masses on inspection palpation  LUNGS: clear to auscultation bilaterally, no wheezes, rales or rhonchi, good air movement CV: HRRR, no clubbing cyanosis or  peripheral edema nl cap refill  Abdomen:  Sof,t normal bowel sounds without hepatosplenomegaly, no guarding rebound or masses no CVA tenderness MS: moves all extremities without noticeable focal  Abnormality OA changes  PSYCH: pleasant and cooperative, no obvious depression or anxiety cognition appears intact  Lab Results  Component Value Date   WBC 7.7 05/24/2013   HGB 13.7 05/24/2013   HCT 40.5 05/24/2013   PLT 190.0 05/24/2013   GLUCOSE 94 05/24/2013   CHOL 150 05/24/2013   TRIG 78.0 05/24/2013   HDL 66.20 05/24/2013   LDLCALC 68 05/24/2013   ALT 16 05/24/2013   AST 18 05/24/2013   NA 142 05/24/2013   K 3.9 05/24/2013   CL 108 05/24/2013   CREATININE 0.8 05/24/2013   BUN 20 05/24/2013   CO2 25 05/24/2013   TSH 0.91 05/24/2013   HGBA1C 5.5 05/24/2013    ASSESSMENT AND PLAN:  Discussed the following assessment and plan:  Hypertension, isolated systolic - controlled   Hx of vomiting - nocturnal  - Plan: Ambulatory referral to Gastroenterology  Rhinitis - poss vm some allergi ccomponent?   Recurrent vomiting - Plan: Ambulatory referral to Gastroenterology  Concerned about intermittent vomiting as this usually has never normal appears to be nocturnal and it wakes her up at night. No severe pain with this such as abdominal angina. This is been ongoing for the last 6+ months. Would like Dr. Regino Schultze input as to advisability of further evaluation or treatment. Patient agrees. He has had some intolerances to PPIs in the  past. -Patient advised to  return or notify health care team  if symptoms worsen or persist or new concerns arise.  Patient Instructions  Trial   Of nasal  Cortisone   such as nasacort ( this is OTC)   2 SPRAYS per nostril each day   For poss allergy.  If not helping after 2 weeks than can stop  blood pressure is good today.  Continue. Uncertain why you are having  Intermittent nocturnal vomiting .      Will arrange for dr Juanda Chance to see you and give advice about intervention evaluation.    WEllness check and labs  Next MAY     Neta Mends. Panosh M.D.

## 2013-10-05 ENCOUNTER — Encounter: Payer: Self-pay | Admitting: Internal Medicine

## 2013-10-06 DIAGNOSIS — J310 Chronic rhinitis: Secondary | ICD-10-CM | POA: Insufficient documentation

## 2013-10-06 DIAGNOSIS — R1110 Vomiting, unspecified: Secondary | ICD-10-CM | POA: Insufficient documentation

## 2013-10-09 ENCOUNTER — Other Ambulatory Visit: Payer: Self-pay | Admitting: Internal Medicine

## 2013-10-14 ENCOUNTER — Other Ambulatory Visit: Payer: Self-pay | Admitting: Internal Medicine

## 2013-10-17 ENCOUNTER — Encounter: Payer: Self-pay | Admitting: *Deleted

## 2013-10-20 DIAGNOSIS — Z23 Encounter for immunization: Secondary | ICD-10-CM | POA: Diagnosis not present

## 2013-10-21 ENCOUNTER — Encounter: Payer: Self-pay | Admitting: Internal Medicine

## 2013-10-24 DIAGNOSIS — M76899 Other specified enthesopathies of unspecified lower limb, excluding foot: Secondary | ICD-10-CM | POA: Diagnosis not present

## 2013-10-26 DIAGNOSIS — Z124 Encounter for screening for malignant neoplasm of cervix: Secondary | ICD-10-CM | POA: Diagnosis not present

## 2013-10-26 DIAGNOSIS — L22 Diaper dermatitis: Secondary | ICD-10-CM | POA: Diagnosis not present

## 2013-10-26 DIAGNOSIS — Z01419 Encounter for gynecological examination (general) (routine) without abnormal findings: Secondary | ICD-10-CM | POA: Diagnosis not present

## 2013-10-26 DIAGNOSIS — Z0000 Encounter for general adult medical examination without abnormal findings: Secondary | ICD-10-CM | POA: Diagnosis not present

## 2013-11-18 ENCOUNTER — Ambulatory Visit (INDEPENDENT_AMBULATORY_CARE_PROVIDER_SITE_OTHER): Payer: Medicare Other | Admitting: Internal Medicine

## 2013-11-18 ENCOUNTER — Other Ambulatory Visit (INDEPENDENT_AMBULATORY_CARE_PROVIDER_SITE_OTHER): Payer: Medicare Other

## 2013-11-18 ENCOUNTER — Encounter: Payer: Self-pay | Admitting: Internal Medicine

## 2013-11-18 VITALS — BP 122/80 | HR 64 | Ht 62.0 in | Wt 119.4 lb

## 2013-11-18 DIAGNOSIS — R112 Nausea with vomiting, unspecified: Secondary | ICD-10-CM | POA: Diagnosis not present

## 2013-11-18 LAB — HEPATIC FUNCTION PANEL
ALT: 25 U/L (ref 0–35)
AST: 20 U/L (ref 0–37)
Albumin: 3.7 g/dL (ref 3.5–5.2)
Alkaline Phosphatase: 55 U/L (ref 39–117)
Bilirubin, Direct: 0 mg/dL (ref 0.0–0.3)
Total Bilirubin: 0.5 mg/dL (ref 0.3–1.2)

## 2013-11-18 NOTE — Patient Instructions (Signed)
You have been scheduled for an abdominal ultrasound, upper GI, and barium swallow at [facility] (1st floor of hospital) on ________ at _____________. Please arrive 15 minutes prior to your appointment for registration. Make certain not to have anything to eat or drink 6 hours prior to your appointment. Should you need to reschedule your appointment, please contact radiology at [phone]. This test typically takes about 30 minutes to perform.  Your physician has requested that you go to the basement for the following lab work before leaving today: Amylase, Lipase, Hepatic Fx  CC: Dr Fabian Sharp

## 2013-11-18 NOTE — Progress Notes (Signed)
Amy Fischer October 11, 1929 MRN [id number]        History of Present Illness:  This is a 79 year old white female referred for evaluation of episodic vomiting. Patient describes painless attacks of vomiting  occurring between 2 and 3 in the morning about 3 or 4 times a year lasting 1-2 hours.. The episodes wake her up and she  vomits  Food and acid . She normally eats supper at 6:30 and goes to bed at 10:30, she sleeps with head of bed elevation.. She denies any jaundice or fever or diarrhea with these episodes but they remind her biliary colic which she experience prior to cholecystectomy.. The last episode occurred about 2 months ago. Her weight has been stable. She underwent uncomplicated laparoscopic cholecystectomy approximately 15 years ago. In January 2013 she was evaluated for noncardiac chest pain, and underwent upper endoscopy which showed a mild gastritis , intestinal metaplasia and glandular atrophy. H.Pylori negative . She has been taking ranitidine 150 mg twice a day since the EGD in 2013. ( PPI's caused diarrhea). but denies any heartburn. There has been no dysphagia. The episodes usually resolve within several hours and she is a well-developed day. She used to be on Fosamax which was discontinued. Her bedtime medications include aspirin, Crestor, thyroid, melatonin,  and ranitidine.     Past Medical History  Diagnosis Date  . GERD (gastroesophageal reflux disease)   . Hyperlipidemia   . Hypothyroidism   . Osteoarthritis   . Osteoporosis   . Episodic recurrent vertigo     MRI Head 2003  . Allergy   . Bladder polyps   . Subclavian steal syndrome     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; CTA 2013 revealed open graft  . Hyperplastic colon polyp   . Esophageal spasm   . History of hepatitis     unknown type  . Asymptomatic carotid artery stenosis     R ICA 40% stenosis on CTA 12/2011.  [name] Anemia   . Cataract     BILATERAL-REMOVED  . Elevated blood pressure 03/25/2011     Bp readings borderline today has hx of elvation  In office and ok at home   Not checke recently   She gfeels was elevated from anxiety    . Diverticulosis   . Intestinal metaplasia of gastric mucosa    Past Surgical History  Procedure Laterality Date  . Appendectomy    . Cholecystectomy    . Carotid-subclavian bypass graft Left 1999    for [place] steal syndrome  . Rotator cuff repair Left   . Elbow surgery Left   . Cystectomy Left     hand  . Tubal ligation    . Tonsillectomy    . Colonoscopy      reports that she quit smoking about 22 years ago. She has never used smokeless tobacco. She reports that she drinks about 2.4 ounces of alcohol per week. She reports that she does not use illicit drugs. family history includes Heart disease in her father; Hypertension in her mother; Stroke in her mother. There is no history of Colon cancer. Allergies  Allergen Reactions  . Augmentin [Amoxicillin-Pot Clavulanate] Diarrhea    Not allergic  2014  . Codeine Nausea And Vomiting  . Risedronate Sodium     unknown  . Statins     Muscles hurt         Review of Systems: Negative for early satiety, weight loss or dysphagia  The remainder of the 10  point ROS is negative except as outlined in H&P   Physical Exam: General appearance  Well developed, in no distress. Eyes- non icteric. HEENT nontraumatic, normocephalic. Mouth no lesions, tongue papillated, no cheilosis. Neck supple without adenopathy, thyroid not enlarged, no carotid bruits, no JVD. Lungs Clear to auscultation bilaterally. Cor normal S1, normal S2, regular rhythm, no murmur,  quiet precordium. Abdomen: Soft nontender abdomen with normal active bowel sounds. No distention. Increased tympany in right upper quadrant. Liver edge at costal margin Rectal: Soft Hemoccult negative stool Extremities no pedal edema. Skin no lesions. Neurological alert and oriented x 3. Psychological normal mood and affect.  Assessment and  Plan:  79 year old white female with discrete episodes of nocturnal vomiting without specific pain or diarrhea. They resemble gallbladder attacks  15 years ago prior to laparoscopic cholecystectomy. Retained common bile duct stone is a possibility. We are going to obtain amylase, lipase, liver function test and an upper abdominal ultrasound with attention to the common bile duct. Similar symptoms could be also associated with nocturnal gastroesophageal reflux  . She has been on ranitidine 150 mg twice a day because she in the past could not tolerate PPI's. We will obtain upper GI series with barium esophagram to assess  motility as well as possibly  reflux and also  gastric emptying. She was not interested in upper endoscopy. She will continue on ranitidine 150 mg at bedtime. We'll continue with the head of the bed elevation at night  Intestinal metaplasia on upper endoscopy. This is a lesion which is a precursor to dysplasia and atrophic gastritis. She will may need to be re\re endoscoped in the future to rule out dysplasia   11/18/2013 Amy Fischer

## 2013-11-21 ENCOUNTER — Telehealth: Payer: Self-pay | Admitting: Internal Medicine

## 2013-11-21 ENCOUNTER — Other Ambulatory Visit: Payer: Self-pay | Admitting: Family Medicine

## 2013-11-21 NOTE — Telephone Encounter (Signed)
Patient Information:  Caller Name: Jamilla  Phone: [phone]  Patient: Amy Fischer  Gender: Female  DOB: 25-Apr-1929  Age: 79 Years  PCP: Berniece Andreas (Family Practice)  Office Follow Up:  Does the office need to follow up with this patient?: Yes  Instructions For The Office: Please contact patient to give further instructions.  RN Note:  Please contact patient in regards to the letter she received regarding carotid testing. She would also like to know if Dr. Fabian Sharp has seen what her blood pressure was when she was at Dr. Regino Schultze office on 11/18/13? Would like to know if she should decrease the amount of blood pressure medication she is taking. Please contact patient regarding both of these questions.  Symptoms  Reason For Call & Symptoms: Patient reports she received a letter in the mail stating she needed to have a carotid test done. She wants to know if Dr. Fabian Sharp is ordering this test and if she has to have it completed.  Reviewed Health History In EMR: N/A  Reviewed Medications In EMR: N/A  Reviewed Allergies In EMR: N/A  Reviewed Surgeries / Procedures: N/A  Date of Onset of Symptoms: Unknown  Guideline(s) Used:  No Protocol Available - Information Only  Disposition Per Guideline:   Discuss with PCP and Callback by Nurse Today  Reason For Disposition Reached:   Nursing judgment  Advice Given:  N/A  Patient Will Follow Care Advice:  YES

## 2013-11-21 NOTE — Telephone Encounter (Signed)
i dont know who sent the letter  [facility] please see if you can find out about this . Is not curcial that she have this test rightg away. Unless a specialist wants to have this done.   Would not adjust her bp medication unless her readings are too low or she is getting sx of low BP .  Low is 100/50 range   Check some readings  About 3 x per week and let us know  Results.

## 2013-11-25 ENCOUNTER — Ambulatory Visit (INDEPENDENT_AMBULATORY_CARE_PROVIDER_SITE_OTHER): Payer: Medicare Other | Admitting: Family Medicine

## 2013-11-25 ENCOUNTER — Encounter: Payer: Self-pay | Admitting: Family Medicine

## 2013-11-25 VITALS — BP 128/68 | HR 65 | Temp 98.0°F | Wt 122.0 lb

## 2013-11-25 DIAGNOSIS — H10023 Other mucopurulent conjunctivitis, bilateral: Secondary | ICD-10-CM

## 2013-11-25 DIAGNOSIS — H10029 Other mucopurulent conjunctivitis, unspecified eye: Secondary | ICD-10-CM

## 2013-11-25 MED ORDER — TOBRAMYCIN 0.3 % OP SOLN: 2.0000 [drp] | OPHTHALMIC | Status: DC

## 2013-11-25 NOTE — Progress Notes (Signed)
   Subjective:    Patient ID: Amy Fischer, female    DOB: 10/12/1929, 79 y.o.   MRN: [id number]  HPI Acute visit 2 day history of purulent drainage from both eyes. She's had some mild itching. No eye pain. No blurred vision. No fevers or chills. No recent acute sinusitis symptoms. She's had previous cataract surgery remotely. Denies any contact use. She's tried occasional warm compresses.  Past Medical History  Diagnosis Date  . GERD (gastroesophageal reflux disease)   . Hyperlipidemia   . Hypothyroidism   . Osteoarthritis   . Osteoporosis   . Episodic recurrent vertigo     MRI Head 2003  . Allergy   . Bladder polyps   . Subclavian steal syndrome     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; CTA 2013 revealed open graft  . Hyperplastic colon polyp   . Esophageal spasm   . History of hepatitis     unknown type  . Asymptomatic carotid artery stenosis     R ICA 40% stenosis on CTA 12/2011.  [name] Anemia   . Cataract     BILATERAL-REMOVED  . Elevated blood pressure 03/25/2011    Bp readings borderline today has hx of elvation  In office and ok at home   Not checke recently   She gfeels was elevated from anxiety    . Diverticulosis   . Intestinal metaplasia of gastric mucosa    Past Surgical History  Procedure Laterality Date  . Appendectomy    . Cholecystectomy    . Carotid-subclavian bypass graft Left 1999    for [place] steal syndrome  . Rotator cuff repair Left   . Elbow surgery Left   . Cystectomy Left     hand  . Tubal ligation    . Tonsillectomy    . Colonoscopy      reports that she quit smoking about 22 years ago. She has never used smokeless tobacco. She reports that she drinks about 2.4 ounces of alcohol per week. She reports that she does not use illicit drugs. family history includes Heart disease in her father; Hypertension in her mother; Stroke in her mother. There is no history of Colon cancer. Allergies  Allergen Reactions  . Augmentin [Amoxicillin-Pot  Clavulanate] Diarrhea    Not allergic  2014  . Codeine Nausea And Vomiting  . Risedronate Sodium     unknown  . Statins     Muscles hurt       Review of Systems  Constitutional: Negative for fever and chills.  Eyes: Positive for discharge, redness and itching. Negative for photophobia, pain and visual disturbance.       Objective:   Physical Exam  Constitutional: She appears well-developed and well-nourished.  Eyes: Pupils are equal, round, and reactive to light.  Both conjunctive are very erythematous. She has some yellowish crusted drainage on both upper eyelids. Extraocular movements are normal.  No corneal foreign bodies.  Cardiovascular: Normal rate.           Assessment & Plan:  Bacterial conjunctivitis bilaterally. Continue warm compresses. TobraDex eyedrops every 4 hours while awake

## 2013-11-25 NOTE — Progress Notes (Signed)
Pre visit review using our clinic review tool, if applicable. No additional management support is needed unless otherwise documented below in the visit note. 

## 2013-11-25 NOTE — Patient Instructions (Signed)

## 2013-11-28 ENCOUNTER — Ambulatory Visit (HOSPITAL_COMMUNITY)
Admission: RE | Admit: 2013-11-28 | Discharge: 2013-11-28 | Disposition: A | Discharge status: Home / Self Care | Payer: Medicare Other | Source: Ambulatory Visit | Attending: Internal Medicine | Admitting: Internal Medicine

## 2013-11-28 ENCOUNTER — Other Ambulatory Visit: Payer: Self-pay | Admitting: *Deleted

## 2013-11-28 DIAGNOSIS — K7689 Other specified diseases of liver: Secondary | ICD-10-CM | POA: Insufficient documentation

## 2013-11-28 DIAGNOSIS — R112 Nausea with vomiting, unspecified: Secondary | ICD-10-CM

## 2013-11-28 DIAGNOSIS — M47812 Spondylosis without myelopathy or radiculopathy, cervical region: Secondary | ICD-10-CM | POA: Insufficient documentation

## 2013-11-28 DIAGNOSIS — Z9089 Acquired absence of other organs: Secondary | ICD-10-CM | POA: Insufficient documentation

## 2013-11-28 DIAGNOSIS — N289 Disorder of kidney and ureter, unspecified: Secondary | ICD-10-CM | POA: Insufficient documentation

## 2013-11-28 DIAGNOSIS — K219 Gastro-esophageal reflux disease without esophagitis: Secondary | ICD-10-CM | POA: Diagnosis not present

## 2013-11-28 MED ORDER — OMEPRAZOLE 40 MG PO CPDR: DELAYED_RELEASE_CAPSULE | ORAL | Status: DC

## 2013-11-29 ENCOUNTER — Encounter: Payer: Self-pay | Admitting: *Deleted

## 2013-12-01 ENCOUNTER — Other Ambulatory Visit: Payer: Self-pay | Admitting: Internal Medicine

## 2013-12-15 DIAGNOSIS — M76899 Other specified enthesopathies of unspecified lower limb, excluding foot: Secondary | ICD-10-CM | POA: Diagnosis not present

## 2013-12-20 ENCOUNTER — Encounter: Payer: Self-pay | Admitting: Internal Medicine

## 2013-12-20 ENCOUNTER — Ambulatory Visit (INDEPENDENT_AMBULATORY_CARE_PROVIDER_SITE_OTHER): Payer: Medicare Other | Admitting: Internal Medicine

## 2013-12-20 VITALS — BP 132/62 | HR 72 | Ht 62.0 in | Wt 122.1 lb

## 2013-12-20 DIAGNOSIS — K219 Gastro-esophageal reflux disease without esophagitis: Secondary | ICD-10-CM | POA: Diagnosis not present

## 2013-12-20 DIAGNOSIS — R932 Abnormal findings on diagnostic imaging of liver and biliary tract: Secondary | ICD-10-CM | POA: Diagnosis not present

## 2013-12-20 MED ORDER — OMEPRAZOLE 40 MG PO CPDR: DELAYED_RELEASE_CAPSULE | ORAL | Status: DC

## 2013-12-20 NOTE — Progress Notes (Signed)
Amy Fischer [birth year] [id number]   History of Present Illness:  This is an 79 year old white female with nocturnal nausea and vomiting. Her last appointment was in November 2014. She had an upper abdominal ultrasound and upper GI series at that time. She has a small hemangioma in the right lobe of the liver and a small complex cyst in the right kidney measuring 1.9x1.6x1.8 cm. Radiology is requesting followup imaging for stability; preferably a CT scan. Patient had a prior cholecystectomy. Her common bile duct was 6.1 mm. Her amylase and lipase are within normal limits. She had a cholecystectomy about 15 years ago. We put her on omeprazole 40 mg twice a day which resulted in complete resolution of her nocturnal vomiting. An upper GI series confirmed significant reflux to the level of the criticopharyngeus. A barium tablet passed without delay. Findings were consistent with severe reflux. A prior upper endoscopy in January 2013 showed gastritis and intestinal metaplasia as well as glandular atrophy.    Past Medical History  Diagnosis Date  . GERD (gastroesophageal reflux disease)   . Hyperlipidemia   . Hypothyroidism   . Osteoarthritis   . Osteoporosis   . Episodic recurrent vertigo     MRI Head 2003  . Allergy   . Bladder polyps   . Subclavian steal syndrome     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; CTA 2013 revealed open graft  . Hyperplastic colon polyp   . Esophageal spasm   . History of hepatitis     unknown type  . Asymptomatic carotid artery stenosis     R ICA 40% stenosis on CTA 12/2011.  [name] Anemia   . Cataract     BILATERAL-REMOVED  . Elevated blood pressure 03/25/2011    Bp readings borderline today has hx of elvation  In office and ok at home   Not checke recently   She gfeels was elevated from anxiety    . Diverticulosis   . Intestinal metaplasia of gastric mucosa   . Hepatic hemangioma     Past Surgical History  Procedure Laterality Date  . Appendectomy     . Cholecystectomy    . Carotid-subclavian bypass graft Left 1999    for [place] steal syndrome  . Rotator cuff repair Left   . Elbow surgery Left   . Cystectomy Left     hand  . Tubal ligation    . Tonsillectomy    . Colonoscopy      Allergies  Allergen Reactions  . Augmentin [Amoxicillin-Pot Clavulanate] Diarrhea    Not allergic  2014  . Codeine Nausea And Vomiting  . Risedronate Sodium     unknown  . Statins     Muscles hurt     Family history and social history have been reviewed.  Review of Systems: Denies nausea vomiting dysphagia or chest pain  The remainder of the 10 point ROS is negative except as outlined in the H&P  Physical Exam: General Appearance Well developed, in no distress Psychological Normal mood and affect  Assessment and Plan:   Problem #1 significant gastroesophageal reflux as demonstrated on an upper GI series. Patient has responded to omeprazole 40 mg twice a day. She will try to reduce the omeprazole to 40 mg a day by holding the morning dose. Most of her reflux occurs at night. If the vomiting recurs, she will go back to twice a day dose.  Problem #2 Liver and renal lesions on ultrasound. These are incidental findings. We will  plan a CT scan of the abdomen with attention to renal and liver lesions in May 2015.    Lina Sar 12/20/2013

## 2013-12-20 NOTE — Patient Instructions (Addendum)
You will be due for a CT scan of your abdomen in May 2015. We will contact you to schedule that appointment when it gets closer to that time.  We have sent the following prescriptions to your mail in pharmacy: Omeprazole 40 mg twice daily  If you have not heard from your mail in pharmacy within 1 week or if you have not received your medication in the mail, please contact us at [phone] so we may find out why.  CC: Dr Fabian Sharp

## 2013-12-26 ENCOUNTER — Other Ambulatory Visit: Payer: Self-pay | Admitting: Internal Medicine

## 2014-01-03 ENCOUNTER — Ambulatory Visit (INDEPENDENT_AMBULATORY_CARE_PROVIDER_SITE_OTHER): Payer: Medicare Other | Admitting: Internal Medicine

## 2014-01-03 ENCOUNTER — Encounter: Payer: Self-pay | Admitting: Internal Medicine

## 2014-01-03 ENCOUNTER — Ambulatory Visit (INDEPENDENT_AMBULATORY_CARE_PROVIDER_SITE_OTHER)
Admission: RE | Admit: 2014-01-03 | Discharge: 2014-01-03 | Disposition: A | Discharge status: Home / Self Care | Payer: Medicare Other | Source: Ambulatory Visit | Attending: Internal Medicine | Admitting: Internal Medicine

## 2014-01-03 VITALS — BP 146/72 | HR 80 | Temp 97.4°F | Wt 121.0 lb

## 2014-01-03 DIAGNOSIS — J209 Acute bronchitis, unspecified: Secondary | ICD-10-CM | POA: Diagnosis not present

## 2014-01-03 DIAGNOSIS — I10 Essential (primary) hypertension: Secondary | ICD-10-CM | POA: Diagnosis not present

## 2014-01-03 DIAGNOSIS — J449 Chronic obstructive pulmonary disease, unspecified: Secondary | ICD-10-CM | POA: Diagnosis not present

## 2014-01-03 DIAGNOSIS — R6889 Other general symptoms and signs: Secondary | ICD-10-CM

## 2014-01-03 MED ORDER — NEBIVOLOL HCL 2.5 MG PO TABS: 1.2500 mg | ORAL_TABLET | Freq: Every day | ORAL | Status: DC

## 2014-01-03 MED ORDER — AZITHROMYCIN 250 MG PO TABS: 250.0000 mg | ORAL_TABLET | ORAL | Status: DC

## 2014-01-03 NOTE — Patient Instructions (Addendum)
Concern  About bacterial infection  Because of hx of blood in mucous last week. however still could be a bad virus respiratory infection . Chest exam is clear today but get chest x ray . If no impoving in a few days then  Can add antibiotic .

## 2014-01-03 NOTE — Progress Notes (Signed)
Chief Complaint  Patient presents with  . Cough    Started on Christmas Day.  Has tried multiple OTC medications.  . Nasal Congestion  . Watery Eyes  . Generalized Body Aches  . Headache  . Chills    HPI: Patient comes in today for SDA for  new problem evaluation.  Onset x mas st cough  Blood a liitte a week ago .  Speck so of bleed  .   Feels short of breath had cheek pain 3-4 days ago . Has exposed to ggc gets resp sx.  BP ok taking 1/4 tab.  5 bystolic  ROS: See pertinent positives and negatives per HPI. GI : on meds for acid reflux and bvomiting vomited x mas time not now   Past Medical History  Diagnosis Date  . GERD (gastroesophageal reflux disease)   . Hyperlipidemia   . Hypothyroidism   . Osteoarthritis   . Osteoporosis   . Episodic recurrent vertigo     MRI Head 2003  . Allergy   . Bladder polyps   . Subclavian steal syndrome     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; CTA 2013 revealed open graft  . Hyperplastic colon polyp   . Esophageal spasm   . History of hepatitis     unknown type  . Asymptomatic carotid artery stenosis     R ICA 40% stenosis on CTA 12/2011.  [name] Anemia   . Cataract     BILATERAL-REMOVED  . Elevated blood pressure 03/25/2011    Bp readings borderline today has hx of elvation  In office and ok at home   Not checke recently   She gfeels was elevated from anxiety    . Diverticulosis   . Intestinal metaplasia of gastric mucosa   . Hepatic hemangioma     Family History  Problem Relation Age of Onset  . Hypertension Mother   . Heart disease Father   . Stroke Mother   . Colon cancer Neg Hx     History   Social History  . Marital Status: Widowed    Spouse Name: N/A    Number of Children: 4  . Years of Education: N/A   Occupational History  . retired    Social History Main Topics  . Smoking status: Former Smoker    Quit date: 01/05/1991  . Smokeless tobacco: Never Used  . Alcohol Use: 2.4 oz/week    4 Glasses of wine  per week     Comment: socially  . Drug Use: No  . Sexual Activity: None   Other Topics Concern  . None   Social History Narrative   Widowed   HH of 1    No pets   Former smoker   Exercises regularly    Outpatient Encounter Prescriptions as of 01/03/2014  Medication Sig  . aspirin 325 MG tablet Take 325 mg by mouth daily.    [name] azelastine (ASTELIN) 137 MCG/SPRAY nasal spray USE ONE SPRAY IN EACH NOSTRIL TWICE A DAY AS DIRECTED  . calcium-vitamin D (OSCAL WITH D) 500-200 MG-UNIT per tablet Take 1 tablet by mouth daily.    . Cholecalciferol (VITAMIN D3) 1000 UNITS CAPS Take 1 capsule by mouth daily.   . CRESTOR 5 MG tablet TAKE ONE-HALF (1/2) TABLET DAILY  . docusate sodium (COLACE) 100 MG capsule Take 100 mg by mouth at bedtime.   . fexofenadine (ALLEGRA) 180 MG tablet Take 180 mg by mouth daily.  . fish oil-omega-3 fatty acids  1000 MG capsule Take 1 g by mouth daily.   [name] glucosamine-chondroitin 500-400 MG tablet Take 1 tablet by mouth daily.   [name] ibuprofen (ADVIL,MOTRIN) 200 MG tablet Take 200 mg by mouth every 6 (six) hours as needed. Patient used this medication for a headache.  . levothyroxine (SYNTHROID, LEVOTHROID) 88 MCG tablet TAKE 1 TABLET DAILY  . Melatonin 3 MG TABS Take 1 tablet by mouth every other day.   [name] omeprazole (PRILOSEC) 40 MG capsule Take one po BID  . ranitidine (ZANTAC) 300 MG capsule TAKE 1 CAPSULE TWICE A DAY  . ZETIA 10 MG tablet TAKE 1 TABLET DAILY  . [DISCONTINUED] BYSTOLIC 5 MG tablet TAKE ONE-HALF TABLET (2.5 MG) DAILY  . [DISCONTINUED] nebivolol (BYSTOLIC) 5 MG tablet Take 1/4 tab  . azithromycin (ZITHROMAX Z-PAK) 250 MG tablet Take 1 tablet (250 mg total) by mouth as directed. Take 2 po first day, then 1 po qd  . multivitamin (THERAGRAN) per tablet Take 1 tablet by mouth daily.    . nebivolol (BYSTOLIC) 2.5 MG tablet Take 0.5 tablets (1.25 mg total) by mouth daily.  . nitroGLYCERIN (NITROSTAT) 0.4 MG SL tablet Place 1 tablet (0.4 mg total) under the  tongue every 5 (five) minutes as needed for chest pain.  . [DISCONTINUED] tobramycin (TOBREX) 0.3 % ophthalmic solution Place 2 drops into both eyes every 4 (four) hours.    EXAM:  BP 146/72  Pulse 80  Temp(Src) 97.4 F (36.3 C) (Oral)  Wt 121 lb (54.885 kg)  SpO2 98%  Body mass index is 22.13 kg/(m^2). WDWN in NAD  quiet respirations; very  congested  somewhat hoarse. Non toxic .bronchial cough  Feels badly  HEENT: Normocephalic ;atraumatic , Eyes;  PERRL, EOMs  Full, lids and conjunctiva clear,,Ears: no deformities, canals nl, TM landmarks normal, Nose: no deformity mucoiud  discharge  congested;face non tender Mouth : OP clear without lesion or edema . Neck: Supple without adenopathy or masses  Chest:  Clear to A&P without wheezes rales or rhonchi? CV:  S1-S2 no gallops or murmurs peripheral perfusion is normal Skin :nl perfusion and no acute rashes  PSYCH: pleasant and cooperative, no obvious depression or anxiety  ASSESSMENT AND PLAN:  Discussed the following assessment and plan:  Acute bronchitis - viral vs atypical bacterial R/O PNA - Plan: DG Chest 2 View  Flu-like symptoms - Plan: DG Chest 2 View  Hypertension, isolated systolic - only taking 0.97 slplit table rewritten  bp 130 140 range 2.5 mg some se sx.  Supposed to fly to [place] in 3 days uncertain may be too ill to go  Rest fluids  Antibiotic if abn x ray of not improve in next 2 days or so  -Patient advised to return or notify health care team  if symptoms worsen or persist or new concerns arise.  Patient Instructions  Concern  About bacterial infection  Because of hx of blood in mucous last week. however still could be a bad virus respiratory infection . Chest exam is clear today but get chest x ray . If no impoving in a few days then  Can add antibiotic .    [name]. Panosh M.D.  Pre visit review using our clinic review tool, if applicable. No additional management support is needed unless otherwise  documented below in the visit note.

## 2014-01-04 DIAGNOSIS — J209 Acute bronchitis, unspecified: Secondary | ICD-10-CM | POA: Insufficient documentation

## 2014-01-04 DIAGNOSIS — R6889 Other general symptoms and signs: Secondary | ICD-10-CM | POA: Insufficient documentation

## 2014-01-05 ENCOUNTER — Encounter: Payer: Self-pay | Admitting: Family Medicine

## 2014-01-09 DIAGNOSIS — H10509 Unspecified blepharoconjunctivitis, unspecified eye: Secondary | ICD-10-CM | POA: Diagnosis not present

## 2014-01-19 DIAGNOSIS — H04559 Acquired stenosis of unspecified nasolacrimal duct: Secondary | ICD-10-CM | POA: Diagnosis not present

## 2014-02-27 DIAGNOSIS — H02539 Eyelid retraction unspecified eye, unspecified lid: Secondary | ICD-10-CM | POA: Diagnosis not present

## 2014-02-27 DIAGNOSIS — L719 Rosacea, unspecified: Secondary | ICD-10-CM | POA: Diagnosis not present

## 2014-02-27 DIAGNOSIS — H01029 Squamous blepharitis unspecified eye, unspecified eyelid: Secondary | ICD-10-CM | POA: Diagnosis not present

## 2014-02-27 DIAGNOSIS — H11439 Conjunctival hyperemia, unspecified eye: Secondary | ICD-10-CM | POA: Diagnosis not present

## 2014-02-27 DIAGNOSIS — H04229 Epiphora due to insufficient drainage, unspecified lacrimal gland: Secondary | ICD-10-CM | POA: Diagnosis not present

## 2014-02-27 DIAGNOSIS — H04559 Acquired stenosis of unspecified nasolacrimal duct: Secondary | ICD-10-CM | POA: Diagnosis not present

## 2014-03-04 ENCOUNTER — Other Ambulatory Visit: Payer: Self-pay | Admitting: Internal Medicine

## 2014-03-07 DIAGNOSIS — E039 Hypothyroidism, unspecified: Secondary | ICD-10-CM | POA: Diagnosis not present

## 2014-03-07 DIAGNOSIS — M810 Age-related osteoporosis without current pathological fracture: Secondary | ICD-10-CM | POA: Diagnosis not present

## 2014-03-07 DIAGNOSIS — E213 Hyperparathyroidism, unspecified: Secondary | ICD-10-CM | POA: Diagnosis not present

## 2014-03-30 ENCOUNTER — Encounter (HOSPITAL_BASED_OUTPATIENT_CLINIC_OR_DEPARTMENT_OTHER): Payer: Self-pay | Admitting: Emergency Medicine

## 2014-03-30 ENCOUNTER — Inpatient Hospital Stay (HOSPITAL_BASED_OUTPATIENT_CLINIC_OR_DEPARTMENT_OTHER)
Admission: EM | Admit: 2014-03-30 | Discharge: 2014-04-04 | DRG: 392 | Disposition: A | Discharge status: Home / Self Care | Payer: Medicare Other | Attending: Internal Medicine | Admitting: Internal Medicine

## 2014-03-30 ENCOUNTER — Emergency Department (HOSPITAL_BASED_OUTPATIENT_CLINIC_OR_DEPARTMENT_OTHER): Payer: Medicare Other

## 2014-03-30 DIAGNOSIS — Z9181 History of falling: Secondary | ICD-10-CM

## 2014-03-30 DIAGNOSIS — R030 Elevated blood-pressure reading, without diagnosis of hypertension: Secondary | ICD-10-CM

## 2014-03-30 DIAGNOSIS — Z888 Allergy status to other drugs, medicaments and biological substances status: Secondary | ICD-10-CM

## 2014-03-30 DIAGNOSIS — IMO0001 Reserved for inherently not codable concepts without codable children: Secondary | ICD-10-CM

## 2014-03-30 DIAGNOSIS — S42109A Fracture of unspecified part of scapula, unspecified shoulder, initial encounter for closed fracture: Secondary | ICD-10-CM

## 2014-03-30 DIAGNOSIS — R112 Nausea with vomiting, unspecified: Secondary | ICD-10-CM | POA: Diagnosis not present

## 2014-03-30 DIAGNOSIS — Z79890 Hormone replacement therapy (postmenopausal): Secondary | ICD-10-CM

## 2014-03-30 DIAGNOSIS — I739 Peripheral vascular disease, unspecified: Secondary | ICD-10-CM

## 2014-03-30 DIAGNOSIS — Z881 Allergy status to other antibiotic agents status: Secondary | ICD-10-CM | POA: Diagnosis not present

## 2014-03-30 DIAGNOSIS — Z885 Allergy status to narcotic agent status: Secondary | ICD-10-CM

## 2014-03-30 DIAGNOSIS — R42 Dizziness and giddiness: Secondary | ICD-10-CM

## 2014-03-30 DIAGNOSIS — G4700 Insomnia, unspecified: Secondary | ICD-10-CM

## 2014-03-30 DIAGNOSIS — R269 Unspecified abnormalities of gait and mobility: Secondary | ICD-10-CM | POA: Diagnosis not present

## 2014-03-30 DIAGNOSIS — R1110 Vomiting, unspecified: Secondary | ICD-10-CM | POA: Diagnosis not present

## 2014-03-30 DIAGNOSIS — E785 Hyperlipidemia, unspecified: Secondary | ICD-10-CM | POA: Diagnosis present

## 2014-03-30 DIAGNOSIS — Z823 Family history of stroke: Secondary | ICD-10-CM

## 2014-03-30 DIAGNOSIS — J988 Other specified respiratory disorders: Secondary | ICD-10-CM | POA: Diagnosis not present

## 2014-03-30 DIAGNOSIS — A088 Other specified intestinal infections: Secondary | ICD-10-CM | POA: Diagnosis present

## 2014-03-30 DIAGNOSIS — R0789 Other chest pain: Secondary | ICD-10-CM

## 2014-03-30 DIAGNOSIS — K219 Gastro-esophageal reflux disease without esophagitis: Secondary | ICD-10-CM | POA: Diagnosis present

## 2014-03-30 DIAGNOSIS — E559 Vitamin D deficiency, unspecified: Secondary | ICD-10-CM

## 2014-03-30 DIAGNOSIS — K229 Disease of esophagus, unspecified: Secondary | ICD-10-CM

## 2014-03-30 DIAGNOSIS — Z87891 Personal history of nicotine dependence: Secondary | ICD-10-CM

## 2014-03-30 DIAGNOSIS — J310 Chronic rhinitis: Secondary | ICD-10-CM

## 2014-03-30 DIAGNOSIS — R197 Diarrhea, unspecified: Secondary | ICD-10-CM

## 2014-03-30 DIAGNOSIS — M79609 Pain in unspecified limb: Secondary | ICD-10-CM

## 2014-03-30 DIAGNOSIS — E860 Dehydration: Secondary | ICD-10-CM | POA: Diagnosis not present

## 2014-03-30 DIAGNOSIS — J309 Allergic rhinitis, unspecified: Secondary | ICD-10-CM

## 2014-03-30 DIAGNOSIS — Z8249 Family history of ischemic heart disease and other diseases of the circulatory system: Secondary | ICD-10-CM

## 2014-03-30 DIAGNOSIS — Z7982 Long term (current) use of aspirin: Secondary | ICD-10-CM | POA: Diagnosis not present

## 2014-03-30 DIAGNOSIS — M1990 Unspecified osteoarthritis, unspecified site: Secondary | ICD-10-CM | POA: Diagnosis present

## 2014-03-30 DIAGNOSIS — R7309 Other abnormal glucose: Secondary | ICD-10-CM

## 2014-03-30 DIAGNOSIS — I10 Essential (primary) hypertension: Secondary | ICD-10-CM

## 2014-03-30 DIAGNOSIS — G458 Other transient cerebral ischemic attacks and related syndromes: Secondary | ICD-10-CM

## 2014-03-30 DIAGNOSIS — Z87898 Personal history of other specified conditions: Secondary | ICD-10-CM

## 2014-03-30 DIAGNOSIS — Z634 Disappearance and death of family member: Secondary | ICD-10-CM

## 2014-03-30 DIAGNOSIS — R05 Cough: Secondary | ICD-10-CM | POA: Diagnosis not present

## 2014-03-30 DIAGNOSIS — E039 Hypothyroidism, unspecified: Secondary | ICD-10-CM | POA: Diagnosis present

## 2014-03-30 DIAGNOSIS — R6889 Other general symptoms and signs: Secondary | ICD-10-CM

## 2014-03-30 DIAGNOSIS — M810 Age-related osteoporosis without current pathological fracture: Secondary | ICD-10-CM | POA: Diagnosis present

## 2014-03-30 DIAGNOSIS — I779 Disorder of arteries and arterioles, unspecified: Secondary | ICD-10-CM

## 2014-03-30 DIAGNOSIS — M792 Neuralgia and neuritis, unspecified: Secondary | ICD-10-CM

## 2014-03-30 DIAGNOSIS — D485 Neoplasm of uncertain behavior of skin: Secondary | ICD-10-CM

## 2014-03-30 DIAGNOSIS — S62609A Fracture of unspecified phalanx of unspecified finger, initial encounter for closed fracture: Secondary | ICD-10-CM

## 2014-03-30 DIAGNOSIS — R059 Cough, unspecified: Secondary | ICD-10-CM | POA: Diagnosis not present

## 2014-03-30 DIAGNOSIS — J209 Acute bronchitis, unspecified: Secondary | ICD-10-CM

## 2014-03-30 DIAGNOSIS — J22 Unspecified acute lower respiratory infection: Secondary | ICD-10-CM

## 2014-03-30 DIAGNOSIS — S82009A Unspecified fracture of unspecified patella, initial encounter for closed fracture: Secondary | ICD-10-CM

## 2014-03-30 LAB — COMPREHENSIVE METABOLIC PANEL
ALT: 21 U/L (ref 0–35)
AST: 26 U/L (ref 0–37)
Albumin: 3.7 g/dL (ref 3.5–5.2)
Alkaline Phosphatase: 66 U/L (ref 39–117)
BUN: 25 mg/dL — ABNORMAL HIGH (ref 6–23)
CALCIUM: 10 mg/dL (ref 8.4–10.5)
CO2: 22 mEq/L (ref 19–32)
Chloride: 103 mEq/L (ref 96–112)
Creatinine, Ser: 0.8 mg/dL (ref 0.50–1.10)
GFR calc non Af Amer: 66 mL/min — ABNORMAL LOW (ref >90)
GFR, EST AFRICAN AMERICAN: 77 mL/min — AB (ref >90)
GLUCOSE: 103 mg/dL — AB (ref 70–99)
Potassium: 4.1 mEq/L (ref 3.7–5.3)
SODIUM: 140 meq/L (ref 137–147)
TOTAL PROTEIN: 7.3 g/dL (ref 6.0–8.3)
Total Bilirubin: 0.5 mg/dL (ref 0.3–1.2)

## 2014-03-30 LAB — CBC WITH DIFFERENTIAL/PLATELET
BASOS PCT: 1 % (ref 0–1)
Basophils Absolute: 0 10*3/uL (ref 0.0–0.1)
EOS ABS: 0.1 10*3/uL (ref 0.0–0.7)
EOS PCT: 2 % (ref 0–5)
HCT: 44.3 % (ref 36.0–46.0)
Hemoglobin: 15 g/dL (ref 12.0–15.0)
Lymphocytes Relative: 27 % (ref 12–46)
Lymphs Abs: 1.7 10*3/uL (ref 0.7–4.0)
MCH: 32.7 pg (ref 26.0–34.0)
MCHC: 33.9 g/dL (ref 30.0–36.0)
MCV: 96.5 fL (ref 78.0–100.0)
Monocytes Absolute: 0.8 10*3/uL (ref 0.1–1.0)
Monocytes Relative: 14 % — ABNORMAL HIGH (ref 3–12)
Neutro Abs: 3.5 10*3/uL (ref 1.7–7.7)
Neutrophils Relative %: 57 % (ref 43–77)
PLATELETS: 201 10*3/uL (ref 150–400)
RBC: 4.59 MIL/uL (ref 3.87–5.11)
RDW: 13.2 % (ref 11.5–15.5)
WBC: 6.2 10*3/uL (ref 4.0–10.5)

## 2014-03-30 LAB — LIPASE, BLOOD: Lipase: 29 U/L (ref 11–59)

## 2014-03-30 MED ORDER — DIPHENOXYLATE-ATROPINE 2.5-0.025 MG PO TABS
1.0000 | ORAL_TABLET | Freq: Once | ORAL | Status: AC
  Administered 2014-03-30: 1 via ORAL
  Filled 2014-03-30: qty 1

## 2014-03-30 MED ORDER — SODIUM CHLORIDE 0.9 % IV BOLUS (SEPSIS)
1000.0000 mL | Freq: Once | INTRAVENOUS | Status: AC
  Administered 2014-03-30: 1000 mL via INTRAVENOUS

## 2014-03-30 MED ORDER — SODIUM CHLORIDE 0.9 % IV SOLN
INTRAVENOUS | Status: DC
  Administered 2014-03-30 – 2014-04-02 (×6): via INTRAVENOUS

## 2014-03-30 MED ORDER — ONDANSETRON HCL 4 MG/2ML IJ SOLN
4.0000 mg | Freq: Once | INTRAMUSCULAR | Status: AC
  Administered 2014-03-30: 4 mg via INTRAVENOUS
  Filled 2014-03-30: qty 2

## 2014-03-30 NOTE — ED Provider Notes (Signed)
CSN: [id number]     Arrival date & time 03/30/14  1848 History  This chart was scribed for Malvin Johns, MD by Celesta Gentile, ED Scribe. The patient was seen in room MH01/MH01. Patient's care was started at 7:32 PM.    Chief Complaint  Patient presents with  . Diarrhea    The history is provided by the patient. No language interpreter was used.   HPI Comments: Amy Fischer is a 80 y.o. female who presents to the Emergency Department complaining of persistent diarrhea with associated nausea and emesis that started about 4 days ago.  Pt states the emesis stopped about 3 days ago, but the diarrhea continued.  She denies any abdominal pain and difficulty urinating.  She states the diarrhea is watery.  Pt states she thought she had the diarrhea under control this morning, but it started again this afternoon.  She states she had rice, a poached egg, toast, and Gatorade today.  She reports she attempted to see her PCP today, but they didn't have an opening so they informed her to visit the ED.  She states she has had a cough that is occassionally productive of thick mucous, but denies nasal congestion.  Pt denies any recent antibiotics.     Past Medical History  Diagnosis Date  . GERD (gastroesophageal reflux disease)   . Hyperlipidemia   . Hypothyroidism   . Osteoarthritis   . Osteoporosis   . Episodic recurrent vertigo     MRI Head 2003  . Allergy   . Bladder polyps   . Subclavian steal syndrome     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; CTA 2013 revealed open graft  . Hyperplastic colon polyp   . Esophageal spasm   . History of hepatitis     unknown type  . Asymptomatic carotid artery stenosis     R ICA 40% stenosis on CTA 12/2011.  [name] Anemia   . Cataract     BILATERAL-REMOVED  . Elevated blood pressure 03/25/2011    Bp readings borderline today has hx of elvation  In office and ok at home   Not checke recently   She gfeels was elevated from anxiety    . Diverticulosis   .  Intestinal metaplasia of gastric mucosa   . Hepatic hemangioma    Past Surgical History  Procedure Laterality Date  . Appendectomy    . Cholecystectomy    . Carotid-subclavian bypass graft Left 1999    for [place] steal syndrome  . Rotator cuff repair Left   . Elbow surgery Left   . Cystectomy Left     hand  . Tubal ligation    . Tonsillectomy    . Colonoscopy     Family History  Problem Relation Age of Onset  . Hypertension Mother   . Heart disease Father   . Stroke Mother   . Colon cancer Neg Hx    History  Substance Use Topics  . Smoking status: Former Smoker    Quit date: 01/05/1991  . Smokeless tobacco: Never Used  . Alcohol Use: 2.4 oz/week    4 Glasses of wine per week     Comment: socially   OB History   Grav Para Term Preterm Abortions TAB SAB Ect Mult Living                 Review of Systems  Constitutional: Negative for fever and chills.  HENT: Negative for congestion and rhinorrhea.   Respiratory: Positive  for cough. Negative for shortness of breath.   Gastrointestinal: Positive for nausea, vomiting and diarrhea.  Skin: Negative for color change and rash.  Neurological: Negative for dizziness and headaches.  Psychiatric/Behavioral: Negative for behavioral problems and confusion.  All other systems reviewed and are negative.   Allergies  Augmentin; Codeine; Risedronate sodium; and Statins  Home Medications   Current Outpatient Rx  Name  Route  Sig  Dispense  Refill  . aspirin 325 MG tablet   Oral   Take 325 mg by mouth daily.           [name] azelastine (ASTELIN) 137 MCG/SPRAY nasal spray      USE ONE SPRAY IN EACH NOSTRIL TWICE A DAY AS DIRECTED   2 mL   3   . azithromycin (ZITHROMAX Z-PAK) 250 MG tablet   Oral   Take 1 tablet (250 mg total) by mouth as directed. Take 2 po first day, then 1 po qd   6 each   0   . calcium-vitamin D (OSCAL WITH D) 500-200 MG-UNIT per tablet   Oral   Take 1 tablet by mouth daily.           .  Cholecalciferol (VITAMIN D3) 1000 UNITS CAPS   Oral   Take 1 capsule by mouth daily.          . CRESTOR 5 MG tablet      TAKE ONE-HALF (1/2) TABLET DAILY   45 tablet   3   . docusate sodium (COLACE) 100 MG capsule   Oral   Take 100 mg by mouth at bedtime.          . fexofenadine (ALLEGRA) 180 MG tablet   Oral   Take 180 mg by mouth daily.         . fish oil-omega-3 fatty acids 1000 MG capsule   Oral   Take 1 g by mouth daily.          [name] glucosamine-chondroitin 500-400 MG tablet   Oral   Take 1 tablet by mouth daily.          [name] ibuprofen (ADVIL,MOTRIN) 200 MG tablet   Oral   Take 200 mg by mouth every 6 (six) hours as needed. Patient used this medication for a headache.         . levothyroxine (SYNTHROID, LEVOTHROID) 88 MCG tablet      TAKE 1 TABLET DAILY   90 tablet   3   . Melatonin 3 MG TABS   Oral   Take 1 tablet by mouth every other day.          . multivitamin (THERAGRAN) per tablet   Oral   Take 1 tablet by mouth daily.           . nebivolol (BYSTOLIC) 2.5 MG tablet   Oral   Take 0.5 tablets (1.25 mg total) by mouth daily.   90 tablet   3   . EXPIRED: nitroGLYCERIN (NITROSTAT) 0.4 MG SL tablet   Sublingual   Place 1 tablet (0.4 mg total) under the tongue every 5 (five) minutes as needed for chest pain.   30 tablet   1   . omeprazole (PRILOSEC) 40 MG capsule      Take one po BID   180 capsule   1   . ranitidine (ZANTAC) 300 MG capsule      TAKE 1 CAPSULE TWICE A DAY   180 capsule   1   . ZETIA 10  MG tablet      TAKE 1 TABLET DAILY   90 tablet   1    Triage Vitals: BP 138/60  Pulse 79  Temp(Src) 97.6 F (36.4 C) (Oral)  Resp 20  Ht 5\' 2"  (1.575 m)  Wt 120 lb (54.432 kg)  BMI 21.94 kg/m2  SpO2 98%  Physical Exam  Nursing note and vitals reviewed. Constitutional: She is oriented to person, place, and time. She appears well-developed and well-nourished. No distress.  HENT:  Head: Normocephalic and atraumatic.   Right Ear: External ear normal.  Left Ear: External ear normal.  Slightly dry mucous membranes.    Eyes: Conjunctivae and EOM are normal. Pupils are equal, round, and reactive to light. Right eye exhibits no discharge. Left eye exhibits no discharge.  Neck: Neck supple. No tracheal deviation present.  Cardiovascular: Normal rate, regular rhythm and normal heart sounds.  Exam reveals no gallop and no friction rub.   No murmur heard. Pulmonary/Chest: Effort normal and breath sounds normal. No respiratory distress. She has no wheezes. She has no rales.  Abdominal: Soft. She exhibits no distension and no mass. There is no tenderness. There is no rebound and no guarding.  Musculoskeletal: Normal range of motion.  Neurological: She is alert and oriented to person, place, and time.  Skin: Skin is warm and dry. No rash noted. She is not diaphoretic.  Psychiatric: She has a normal mood and affect. Her behavior is normal. Judgment and thought content normal.    ED Course  Procedures (including critical care time) DIAGNOSTIC STUDIES: Oxygen Saturation is 98% on RA, normal by my interpretation.    COORDINATION OF CARE: 7:51 PM-Will order Zofran and IV fluids.  Will order x-ray of abdomen with chest.  Will order CBC, comprehensive metabolic panel, lipase, and UA.  Patient informed of current plan of treatment and evaluation and agrees with plan.    Results for orders placed during the hospital encounter of 03/30/14  CBC WITH DIFFERENTIAL      Result Value Ref Range   WBC 6.2  4.0 - 10.5 K/uL   RBC 4.59  3.87 - 5.11 MIL/uL   Hemoglobin 15.0  12.0 - 15.0 g/dL   HCT 44.3  36.0 - 46.0 %   MCV 96.5  78.0 - 100.0 fL   MCH 32.7  26.0 - 34.0 pg   MCHC 33.9  30.0 - 36.0 g/dL   RDW 13.2  11.5 - 15.5 %   Platelets 201  150 - 400 K/uL   Neutrophils Relative % 57  43 - 77 %   Neutro Abs 3.5  1.7 - 7.7 K/uL   Lymphocytes Relative 27  12 - 46 %   Lymphs Abs 1.7  0.7 - 4.0 K/uL   Monocytes Relative 14  (*) 3 - 12 %   Monocytes Absolute 0.8  0.1 - 1.0 K/uL   Eosinophils Relative 2  0 - 5 %   Eosinophils Absolute 0.1  0.0 - 0.7 K/uL   Basophils Relative 1  0 - 1 %   Basophils Absolute 0.0  0.0 - 0.1 K/uL  COMPREHENSIVE METABOLIC PANEL      Result Value Ref Range   Sodium 140  137 - 147 mEq/L   Potassium 4.1  3.7 - 5.3 mEq/L   Chloride 103  96 - 112 mEq/L   CO2 22  19 - 32 mEq/L   Glucose, Bld 103 (*) 70 - 99 mg/dL   BUN 25 (*) 6 - 23 mg/dL  Creatinine, Ser 0.80  0.50 - 1.10 mg/dL   Calcium 10.0  8.4 - 10.5 mg/dL   Total Protein 7.3  6.0 - 8.3 g/dL   Albumin 3.7  3.5 - 5.2 g/dL   AST 26  0 - 37 U/L   ALT 21  0 - 35 U/L   Alkaline Phosphatase 66  39 - 117 U/L   Total Bilirubin 0.5  0.3 - 1.2 mg/dL   GFR calc non Af Amer 66 (*) >90 mL/min   GFR calc Af Amer 77 (*) >90 mL/min  LIPASE, BLOOD      Result Value Ref Range   Lipase 29  11 - 59 U/L   Dg Abd Acute W/chest  03/30/2014   CLINICAL DATA:  Emesis and diarrhea  EXAM: ACUTE ABDOMEN SERIES (ABDOMEN 2 VIEW & CHEST 1 VIEW)  COMPARISON:  Prior chest x-ray 01/03/2014  FINDINGS: Stable cardiac and mediastinal contours. Borderline cardiomegaly. Atherosclerotic calcifications in the mildly tortuous thoracic aorta. The lungs are clear. Chronic bronchitic changes and mild interstitial prominence remain unchanged. Focal eventration of the right hemidiaphragm. Remote healed left seventh rib fracture. No acute osseous abnormality. Surgical clips noted in the soft tissues of the left neck.  Surgical clips in the right upper quadrant consistent with prior cholecystectomy. Nonobstructive bowel gas pattern. Small air-fluid levels are noted in the colon. Gas is noted distally to the level of the rectum. No free air. No acute osseous abnormality. Multilevel degenerative change throughout the spine.  IMPRESSION: 1. No acute cardiopulmonary process. 2. Nonobstructed bowel gas pattern.  No evidence of free air.   Electronically Signed   By: Jacqulynn Cadet  M.D.   On: 03/30/2014 20:55     Imaging Review Dg Abd Acute W/chest  03/30/2014   CLINICAL DATA:  Emesis and diarrhea  EXAM: ACUTE ABDOMEN SERIES (ABDOMEN 2 VIEW & CHEST 1 VIEW)  COMPARISON:  Prior chest x-ray 01/03/2014  FINDINGS: Stable cardiac and mediastinal contours. Borderline cardiomegaly. Atherosclerotic calcifications in the mildly tortuous thoracic aorta. The lungs are clear. Chronic bronchitic changes and mild interstitial prominence remain unchanged. Focal eventration of the right hemidiaphragm. Remote healed left seventh rib fracture. No acute osseous abnormality. Surgical clips noted in the soft tissues of the left neck.  Surgical clips in the right upper quadrant consistent with prior cholecystectomy. Nonobstructive bowel gas pattern. Small air-fluid levels are noted in the colon. Gas is noted distally to the level of the rectum. No free air. No acute osseous abnormality. Multilevel degenerative change throughout the spine.  IMPRESSION: 1. No acute cardiopulmonary process. 2. Nonobstructed bowel gas pattern.  No evidence of free air.   Electronically Signed   By: Jacqulynn Cadet M.D.   On: 03/30/2014 20:55   MDM   Final diagnoses:  Diarrhea    Patient had multiple episodes of diarrhea in the ED. She's having watery diarrhea every few minutes. She was given IV fluids. She was given Lomotil but continues to have diarrhea. I feel that with the amount of diarrhea that she's having we should go ahead and let her for observation. I spoke with the hospitalist at [facility] his accepted the patient for transfer.  I personally performed the services described in this documentation, which was scribed in my presence.  The recorded information has been reviewed and considered.      Malvin Johns, MD 03/30/14 2325

## 2014-03-30 NOTE — ED Notes (Signed)
C/o n/v/d x 4 days-vomiting stopped 3 days ago-diarrhea cont'd

## 2014-03-31 DIAGNOSIS — E860 Dehydration: Secondary | ICD-10-CM | POA: Diagnosis present

## 2014-03-31 DIAGNOSIS — R197 Diarrhea, unspecified: Secondary | ICD-10-CM | POA: Diagnosis not present

## 2014-03-31 LAB — BASIC METABOLIC PANEL
BUN: 19 mg/dL (ref 6–23)
CALCIUM: 8.6 mg/dL (ref 8.4–10.5)
CO2: 20 mEq/L (ref 19–32)
Chloride: 108 mEq/L (ref 96–112)
Creatinine, Ser: 0.69 mg/dL (ref 0.50–1.10)
GFR, EST NON AFRICAN AMERICAN: 78 mL/min — AB (ref >90)
Glucose, Bld: 79 mg/dL (ref 70–99)
POTASSIUM: 3.9 meq/L (ref 3.7–5.3)
SODIUM: 140 meq/L (ref 137–147)

## 2014-03-31 LAB — CBC
HCT: 36.8 % (ref 36.0–46.0)
Hemoglobin: 12.5 g/dL (ref 12.0–15.0)
MCH: 32.6 pg (ref 26.0–34.0)
MCHC: 34 g/dL (ref 30.0–36.0)
MCV: 96.1 fL (ref 78.0–100.0)
Platelets: 164 10*3/uL (ref 150–400)
RBC: 3.83 MIL/uL — ABNORMAL LOW (ref 3.87–5.11)
RDW: 13.5 % (ref 11.5–15.5)
WBC: 5.6 10*3/uL (ref 4.0–10.5)

## 2014-03-31 LAB — CLOSTRIDIUM DIFFICILE BY PCR: Toxigenic C. Difficile by PCR: NEGATIVE

## 2014-03-31 MED ORDER — MELATONIN 3 MG PO TABS: 1.0000 | ORAL_TABLET | ORAL | Status: DC

## 2014-03-31 MED ORDER — FAMOTIDINE 20 MG PO TABS
20.0000 mg | ORAL_TABLET | Freq: Two times a day (BID) | ORAL | Status: DC
  Administered 2014-03-31 – 2014-04-04 (×9): 20 mg via ORAL
  Filled 2014-03-31 (×11): qty 1

## 2014-03-31 MED ORDER — ROSUVASTATIN CALCIUM 5 MG PO TABS
5.0000 mg | ORAL_TABLET | Freq: Every day | ORAL | Status: DC
  Administered 2014-04-01 – 2014-04-04 (×4): 5 mg via ORAL
  Filled 2014-03-31 (×5): qty 1

## 2014-03-31 MED ORDER — LEVOTHYROXINE SODIUM 75 MCG PO TABS
75.0000 ug | ORAL_TABLET | Freq: Every day | ORAL | Status: DC
  Administered 2014-04-01 – 2014-04-03 (×3): 75 ug via ORAL
  Filled 2014-03-31 (×5): qty 1

## 2014-03-31 MED ORDER — SODIUM CHLORIDE 0.9 % IV BOLUS (SEPSIS)
250.0000 mL | Freq: Once | INTRAVENOUS | Status: AC
  Administered 2014-03-31: 250 mL via INTRAVENOUS

## 2014-03-31 MED ORDER — POTASSIUM CHLORIDE CRYS ER 20 MEQ PO TBCR
40.0000 meq | EXTENDED_RELEASE_TABLET | Freq: Once | ORAL | Status: AC
  Administered 2014-03-31: 40 meq via ORAL

## 2014-03-31 MED ORDER — LEVOTHYROXINE SODIUM 88 MCG PO TABS
88.0000 ug | ORAL_TABLET | Freq: Every day | ORAL | Status: DC
  Administered 2014-03-31: 88 ug via ORAL
  Filled 2014-03-31 (×3): qty 1

## 2014-03-31 MED ORDER — NITROGLYCERIN 0.4 MG SL SUBL: 0.4000 mg | SUBLINGUAL_TABLET | SUBLINGUAL | Status: DC | PRN

## 2014-03-31 MED ORDER — PANTOPRAZOLE SODIUM 40 MG PO TBEC
80.0000 mg | DELAYED_RELEASE_TABLET | Freq: Every day | ORAL | Status: DC
  Administered 2014-03-31 – 2014-04-01 (×2): 80 mg via ORAL
  Filled 2014-03-31 (×2): qty 2

## 2014-03-31 MED ORDER — ATORVASTATIN CALCIUM 10 MG PO TABS: 10.0000 mg | ORAL_TABLET | Freq: Every day | ORAL | Status: DC

## 2014-03-31 MED ORDER — LORATADINE 10 MG PO TABS
10.0000 mg | ORAL_TABLET | Freq: Every day | ORAL | Status: DC
  Administered 2014-03-31 – 2014-04-04 (×5): 10 mg via ORAL
  Filled 2014-03-31 (×6): qty 1

## 2014-03-31 MED ORDER — DIPHENOXYLATE-ATROPINE 2.5-0.025 MG PO TABS: 1.0000 | ORAL_TABLET | Freq: Four times a day (QID) | ORAL | Status: DC | PRN

## 2014-03-31 MED ORDER — EZETIMIBE 10 MG PO TABS
10.0000 mg | ORAL_TABLET | Freq: Every day | ORAL | Status: DC
  Administered 2014-03-31: 10 mg via ORAL
  Filled 2014-03-31 (×3): qty 1

## 2014-03-31 MED ORDER — AZELASTINE HCL 0.1 % NA SOLN
1.0000 | Freq: Two times a day (BID) | NASAL | Status: DC
  Administered 2014-03-31 – 2014-04-04 (×9): 1 via NASAL
  Filled 2014-03-31: qty 30

## 2014-03-31 MED ORDER — NEBIVOLOL HCL 2.5 MG PO TABS: 1.2500 mg | ORAL_TABLET | Freq: Every day | ORAL | Status: DC

## 2014-03-31 MED ORDER — ASPIRIN 325 MG PO TABS
325.0000 mg | ORAL_TABLET | Freq: Every day | ORAL | Status: DC
  Administered 2014-03-31 – 2014-04-04 (×5): 325 mg via ORAL
  Filled 2014-03-31 (×7): qty 1

## 2014-03-31 MED ORDER — EZETIMIBE 10 MG PO TABS
10.0000 mg | ORAL_TABLET | Freq: Every day | ORAL | Status: DC
  Administered 2014-03-31 – 2014-04-04 (×5): 10 mg via ORAL
  Filled 2014-03-31 (×5): qty 1

## 2014-03-31 MED ORDER — NEBIVOLOL HCL 2.5 MG PO TABS
1.2500 mg | ORAL_TABLET | Freq: Every day | ORAL | Status: DC
  Filled 2014-03-31: qty 1

## 2014-03-31 MED ORDER — HEPARIN SODIUM (PORCINE) 5000 UNIT/ML IJ SOLN
5000.0000 [IU] | Freq: Three times a day (TID) | INTRAMUSCULAR | Status: DC
  Administered 2014-03-31 – 2014-04-04 (×13): 5000 [IU] via SUBCUTANEOUS
  Filled 2014-03-31 (×16): qty 1

## 2014-03-31 NOTE — Progress Notes (Signed)
Pt refused to take 10:00 AM Nebivolol. MD notified and orders received.

## 2014-03-31 NOTE — Progress Notes (Signed)
Pt admitted to the unit. Pt is alert and oriented. Pt oriented to room, staff, and call bell. Bed in lowest position. Full assessment to Epic. Call bell with in reach. Told to call for assists. Will continue to monitor.  Amy Fischer  

## 2014-03-31 NOTE — H&P (Signed)
[facility] History and Physical  CHEYENE Fischer CZY:[id number] DOB: 10/11/1929 DOA: 03/30/2014  Referring physician: EDP PCP: [name], MD   Chief Complaint: Diarrhea   HPI: Amy Fischer is a 80 y.o. female who presents to the ED at [facility] with persistent diarrhea for the past 4 days.  Initially she had N/V as well as the diarrhea when her symptoms onset on Monday, but the N/V stopped 3 days ago.  Her diarrhea however has been persistent since that time.  She thought she had the diarrhea under control this morning finally, but then it started to get worse this afternoon again.  She tried to see her PCP but they didn't have an opening so she went to the ED at [facility].  Review of Systems: Systems reviewed.  As above, otherwise negative  Past Medical History  Diagnosis Date  . GERD (gastroesophageal reflux disease)   . Hyperlipidemia   . Hypothyroidism   . Osteoarthritis   . Osteoporosis   . Episodic recurrent vertigo     MRI Head 2003  . Allergy   . Bladder polyps   . Subclavian steal syndrome     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; CTA 2013 revealed open graft  . Hyperplastic colon polyp   . Esophageal spasm   . History of hepatitis     unknown type  . Asymptomatic carotid artery stenosis     R ICA 40% stenosis on CTA 12/2011.  [name] Anemia   . Cataract     BILATERAL-REMOVED  . Elevated blood pressure 03/25/2011    Bp readings borderline today has hx of elvation  In office and ok at home   Not checke recently   She gfeels was elevated from anxiety    . Diverticulosis   . Intestinal metaplasia of gastric mucosa   . Hepatic hemangioma    Past Surgical History  Procedure Laterality Date  . Appendectomy    . Cholecystectomy    . Carotid-subclavian bypass graft Left 1999    for [place] steal syndrome  . Rotator cuff repair Left   . Elbow surgery Left   . Cystectomy Left     hand  . Tubal ligation    . Tonsillectomy    . Colonoscopy     Social History:   reports that she quit smoking about 23 years ago. She has never used smokeless tobacco. She reports that she drinks about 2.4 ounces of alcohol per week. She reports that she does not use illicit drugs.  Allergies  Allergen Reactions  . Augmentin [Amoxicillin-Pot Clavulanate] Diarrhea    Not allergic  2014  . Codeine Nausea And Vomiting  . Risedronate Sodium     unknown  . Statins     Muscles hurt     Family History  Problem Relation Age of Onset  . Hypertension Mother   . Heart disease Father   . Stroke Mother   . Colon cancer Neg Hx      Prior to Admission medications   Medication Sig Start Date End Date Taking? Authorizing Provider  aspirin 325 MG tablet Take 325 mg by mouth daily.      Historical Provider, MD  azelastine (ASTELIN) 137 MCG/SPRAY nasal spray USE ONE SPRAY IN EACH NOSTRIL TWICE A DAY AS DIRECTED 06/29/13   Burnis Medin, MD  calcium-vitamin D (OSCAL WITH D) 500-200 MG-UNIT per tablet Take 1 tablet by mouth daily.      Historical Provider, MD  Cholecalciferol (VITAMIN  D3) 1000 UNITS CAPS Take 1 capsule by mouth daily.     Historical Provider, MD  CRESTOR 5 MG tablet TAKE ONE-HALF (1/2) TABLET DAILY 10/14/13   Burnis Medin, MD  docusate sodium (COLACE) 100 MG capsule Take 100 mg by mouth at bedtime.     Historical Provider, MD  fexofenadine (ALLEGRA) 180 MG tablet Take 180 mg by mouth daily.    Historical Provider, MD  fish oil-omega-3 fatty acids 1000 MG capsule Take 1 g by mouth daily.     Historical Provider, MD  glucosamine-chondroitin 500-400 MG tablet Take 1 tablet by mouth daily.     Historical Provider, MD  ibuprofen (ADVIL,MOTRIN) 200 MG tablet Take 200 mg by mouth every 6 (six) hours as needed. Patient used this medication for a headache.    Historical Provider, MD  levothyroxine (SYNTHROID, LEVOTHROID) 88 MCG tablet TAKE 1 TABLET DAILY 06/15/13   Burnis Medin, MD  Melatonin 3 MG TABS Take 1 tablet by mouth every other day.     Historical Provider, MD   multivitamin [facility]) per tablet Take 1 tablet by mouth daily.      Historical Provider, MD  nebivolol (BYSTOLIC) 2.5 MG tablet Take 0.5 tablets (1.25 mg total) by mouth daily. 01/03/14   Burnis Medin, MD  nitroGLYCERIN (NITROSTAT) 0.4 MG SL tablet Place 1 tablet (0.4 mg total) under the tongue every 5 (five) minutes as needed for chest pain. 11/22/11 11/21/12  Ripudeep Krystal Eaton, MD  omeprazole (PRILOSEC) 40 MG capsule Take one po BID 12/20/13   Lafayette Dragon, MD  ranitidine (ZANTAC) 300 MG capsule TAKE 1 CAPSULE TWICE A DAY 12/26/13   Burnis Medin, MD  ZETIA 10 MG tablet TAKE 1 TABLET DAILY 10/09/13   Burnis Medin, MD   Physical Exam: Filed Vitals:   03/31/14 0037  BP: 119/40  Pulse: 67  Temp: 97.6 F (36.4 C)  Resp: 16    BP 119/40  Pulse 67  Temp(Src) 97.6 F (36.4 C) (Oral)  Resp 16  Ht 5\' 2"  (1.575 m)  Wt 54.432 kg (120 lb)  BMI 21.94 kg/m2  SpO2 98%  General Appearance:    Alert, oriented, no distress, appears stated age  Head:    Normocephalic, atraumatic  Eyes:    PERRL, EOMI, sclera non-icteric        Nose:   Nares without drainage or epistaxis. Mucosa, turbinates normal  Throat:   Moist mucous membranes. Oropharynx without erythema or exudate.  Neck:   Supple. No carotid bruits.  No thyromegaly.  No lymphadenopathy.   Back:     No CVA tenderness, no spinal tenderness  Lungs:     Clear to auscultation bilaterally, without wheezes, rhonchi or rales  Chest wall:    No tenderness to palpitation  Heart:    Regular rate and rhythm without murmurs, gallops, rubs  Abdomen:     Soft, non-tender, nondistended, normal bowel sounds, no organomegaly  Genitalia:    deferred  Rectal:    deferred  Extremities:   No clubbing, cyanosis or edema.  Pulses:   2+ and symmetric all extremities  Skin:   Skin color, texture, turgor normal, no rashes or lesions  Lymph nodes:   Cervical, supraclavicular, and axillary nodes normal  Neurologic:   CNII-XII intact. Normal strength,  sensation and reflexes      throughout    Labs on Admission:  Basic Metabolic Panel:  Recent Labs Lab 03/30/14 2004  NA 140  K 4.1  CL 103  CO2 22  GLUCOSE 103*  BUN 25*  CREATININE 0.80  CALCIUM 10.0   Liver Function Tests:  Recent Labs Lab 03/30/14 2004  AST 26  ALT 21  ALKPHOS 66  BILITOT 0.5  PROT 7.3  ALBUMIN 3.7    Recent Labs Lab 03/30/14 2004  LIPASE 29   No results found for this basename: AMMONIA,  in the last 168 hours CBC:  Recent Labs Lab 03/30/14 2004  WBC 6.2  NEUTROABS 3.5  HGB 15.0  HCT 44.3  MCV 96.5  PLT 201   Cardiac Enzymes: No results found for this basename: CKTOTAL, CKMB, CKMBINDEX, TROPONINI,  in the last 168 hours  BNP (last 3 results) No results found for this basename: PROBNP,  in the last 8760 hours CBG: No results found for this basename: GLUCAP,  in the last 168 hours  Radiological Exams on Admission: Dg Abd Acute W/chest  03/30/2014   CLINICAL DATA:  Emesis and diarrhea  EXAM: ACUTE ABDOMEN SERIES (ABDOMEN 2 VIEW & CHEST 1 VIEW)  COMPARISON:  Prior chest x-ray 01/03/2014  FINDINGS: Stable cardiac and mediastinal contours. Borderline cardiomegaly. Atherosclerotic calcifications in the mildly tortuous thoracic aorta. The lungs are clear. Chronic bronchitic changes and mild interstitial prominence remain unchanged. Focal eventration of the right hemidiaphragm. Remote healed left seventh rib fracture. No acute osseous abnormality. Surgical clips noted in the soft tissues of the left neck.  Surgical clips in the right upper quadrant consistent with prior cholecystectomy. Nonobstructive bowel gas pattern. Small air-fluid levels are noted in the colon. Gas is noted distally to the level of the rectum. No free air. No acute osseous abnormality. Multilevel degenerative change throughout the spine.  IMPRESSION: 1. No acute cardiopulmonary process. 2. Nonobstructed bowel gas pattern.  No evidence of free air.   Electronically Signed    By: Jacqulynn Cadet M.D.   On: 03/30/2014 20:55    EKG: Independently reviewed.  Assessment/Plan Principal Problem:   Diarrhea   1. Diarrhea - enteritis, viral vs infectious, C.Diff and stool culture pending, lomotil PRN, supportive care with IVF for now.  Diarrhea was watery and no reports of smelling like C.Diff so will hold off on empiric ABx treatment at this time.  Also no leukocytosis or signs of sepsis.    Code Status: Full Code  Family Communication: No family in room Disposition Plan: Admit to obs   Time spent: 50 min  GARDNER, JARED M. [facility] Pager [phone]  If 7AM-7PM, please contact the day team taking care of the patient [email or web address] Password [email or web address] 03/31/2014, 1:01 AM

## 2014-03-31 NOTE — Progress Notes (Signed)
Pt requesting medication to help with diarrhea. MD notified and aware.

## 2014-03-31 NOTE — Progress Notes (Signed)
Report received from Judson Roch, [place] at [facility], Earlean Polka

## 2014-03-31 NOTE — Progress Notes (Signed)
Pts manual BP is 98/50. Rogue Bussing, NP notified, bolus ordered. NP made aware that pt has not voided since she has been here. Told to call MD if patient has not voided after this bolus. Will continue to monitor.

## 2014-03-31 NOTE — Progress Notes (Signed)
PROGRESS NOTE  Amy Fischer SEG:[id number] DOB: 1929-04-16 DOA: 03/30/2014 PCP: [name], MD  Amy Fischer is a 80 y.o. female who presents to the ED at [facility] with persistent diarrhea for the past 4 days. Initially she had N/V as well as the diarrhea when her symptoms onset on Monday, but the N/V stopped 3 days ago. Her diarrhea however has been persistent since that time. She thought she had the diarrhea under control this morning finally, but then it started to get worse this afternoon again. She tried to see her PCP but they didn't have an opening so she went to the ED at [facility]  Assessment/Plan:  Acute Diarrhea Likely viral C-diff negative Stool Culture pending Abdomen distended. Patient had no further stools until after she ate lunch - at approx 3:30 pm she began having green diarrhea.  Dehydration Patient was significantly dehydrated on admission.   She was started on IVF at 125 ml/hour and finally urinated approximately 15 hours later.  GERD On 80 mg protonix daily.  Stable.  HLD On Zetia and Crestor.   DVT Prophylaxis:  heparin  Code Status: full Family Communication: son and daughter at bedside Disposition Plan: home likely 4/3    Antibiotics: Anti-infectives   None       HPI/Subjective: With abdominal distension and diarrhea this afternoon.  Objective: Filed Vitals:   03/31/14 0037 03/31/14 7371 03/31/14 0624 03/31/14 1300  BP: 119/40 99/32 98/50  135/71  Pulse: 67 63  70  Temp: 97.6 F (36.4 C) 97.9 F (36.6 C)  97.3 F (36.3 C)  TempSrc: Oral Oral  Oral  Resp: 16 16  16   Height:      Weight:      SpO2: 98% 97%  95%    Intake/Output Summary (Last 24 hours) at 03/31/14 1558 Last data filed at 03/31/14 1430  Gross per 24 hour  Intake    444 ml  Output      2 ml  Net    442 ml   Filed Weights   03/30/14 1857  Weight: 54.432 kg (120 lb)    Exam: General: Well developed, well nourished, NAD, appears stated age  16:   PERR, EOMI, Anicteic Sclera, MMM. No pharyngeal erythema or exudates  Neck: Supple, no JVD, no masses  Cardiovascular: RRR, S1 S2 auscultated, no rubs, murmurs or gallops.   Respiratory: Clear to auscultation bilaterally with equal chest rise  Abdomen: Soft, nontender,mild to moderately distended, + very active bowel sounds  Extremities: warm dry without cyanosis clubbing or edema.  Neuro: AAOx3, cranial nerves grossly intact. Strength 5/5 in upper and lower extremities  Skin: Without rashes exudates or nodules.   Psych: Normal affect and demeanor with intact judgement and insight   Data Reviewed: Basic Metabolic Panel:  Recent Labs Lab 03/30/14 2004 03/31/14 0416  NA 140 140  K 4.1 3.9  CL 103 108  CO2 22 20  GLUCOSE 103* 79  BUN 25* 19  CREATININE 0.80 0.69  CALCIUM 10.0 8.6   Liver Function Tests:  Recent Labs Lab 03/30/14 2004  AST 26  ALT 21  ALKPHOS 66  BILITOT 0.5  PROT 7.3  ALBUMIN 3.7    Recent Labs Lab 03/30/14 2004  LIPASE 29   CBC:  Recent Labs Lab 03/30/14 2004 03/31/14 0416  WBC 6.2 5.6  NEUTROABS 3.5  --   HGB 15.0 12.5  HCT 44.3 36.8  MCV 96.5 96.1  PLT 201 164     Recent  Results (from the past 240 hour(s))  CLOSTRIDIUM DIFFICILE BY PCR     Status: None   Collection Time    03/30/14  8:00 PM      Result Value Ref Range Status   C difficile by pcr NEGATIVE  NEGATIVE Final   Comment: Performed at [facility]     Studies: Dg Abd Acute W/chest  03/30/2014   CLINICAL DATA:  Emesis and diarrhea  EXAM: ACUTE ABDOMEN SERIES (ABDOMEN 2 VIEW & CHEST 1 VIEW)  COMPARISON:  Prior chest x-ray 01/03/2014  FINDINGS: Stable cardiac and mediastinal contours. Borderline cardiomegaly. Atherosclerotic calcifications in the mildly tortuous thoracic aorta. The lungs are clear. Chronic bronchitic changes and mild interstitial prominence remain unchanged. Focal eventration of the right hemidiaphragm. Remote healed left seventh rib fracture. No  acute osseous abnormality. Surgical clips noted in the soft tissues of the left neck.  Surgical clips in the right upper quadrant consistent with prior cholecystectomy. Nonobstructive bowel gas pattern. Small air-fluid levels are noted in the colon. Gas is noted distally to the level of the rectum. No free air. No acute osseous abnormality. Multilevel degenerative change throughout the spine.  IMPRESSION: 1. No acute cardiopulmonary process. 2. Nonobstructed bowel gas pattern.  No evidence of free air.   Electronically Signed   By: Jacqulynn Cadet M.D.   On: 03/30/2014 20:55    Scheduled Meds: . aspirin  325 mg Oral Daily  . atorvastatin  10 mg Oral q1800  . azelastine  1 spray Each Nare BID  . ezetimibe  10 mg Oral Daily  . ezetimibe  10 mg Oral Daily  . famotidine  20 mg Oral BID  . heparin  5,000 Units Subcutaneous 3 times per day  . [START ON 04/01/2014] levothyroxine  75 mcg Oral QAC breakfast  . levothyroxine  88 mcg Oral QAC breakfast  . loratadine  10 mg Oral Daily  . pantoprazole  80 mg Oral Daily  . potassium chloride  40 mEq Oral Once   Continuous Infusions: . sodium chloride 50 mL/hr at 03/31/14 1554    Principal Problem:   Diarrhea Active Problems:   HYPERLIPIDEMIA   GERD   Dehydration    Karen Kitchens [facility] Pager [phone]. If 7PM-7AM, please contact night-coverage at [email or web address], password [facility] 03/31/2014, 3:58 PM  LOS: 1 day

## 2014-03-31 NOTE — Progress Notes (Signed)
Orthostatic VS: lying BP 154/69, sitting BP 130/91, standing BP 123/69

## 2014-03-31 NOTE — Progress Notes (Signed)
Pt has not voided since she was at [facility]. She received a liter bolus and has fluids running at 125. Bladder scan done, 186 ml of urine found. Will continue to monitor.

## 2014-03-31 NOTE — Progress Notes (Signed)
Pt has not had a bowel movement since arriving from [facility].

## 2014-03-31 NOTE — Progress Notes (Signed)
Addendum  Patient seen and examined, chart and data base reviewed.  I agree with the above assessment and plan.  For full details please see Mrs. Imogene Burn PA note.  Diarrhea, C. difficile is negative, stool cultures pending, likely viral.   Birdie Hopes, MD [facility] Pager: [phone] 03/31/2014, 4:21 PM

## 2014-03-31 NOTE — Discharge Instructions (Signed)
Resume bystolic on 4/4 when you are able to eat and drink more normally.

## 2014-04-01 DIAGNOSIS — R42 Dizziness and giddiness: Secondary | ICD-10-CM

## 2014-04-01 DIAGNOSIS — J209 Acute bronchitis, unspecified: Secondary | ICD-10-CM | POA: Diagnosis not present

## 2014-04-01 DIAGNOSIS — J988 Other specified respiratory disorders: Secondary | ICD-10-CM

## 2014-04-01 DIAGNOSIS — R269 Unspecified abnormalities of gait and mobility: Secondary | ICD-10-CM | POA: Diagnosis not present

## 2014-04-01 DIAGNOSIS — R0789 Other chest pain: Secondary | ICD-10-CM

## 2014-04-01 DIAGNOSIS — R197 Diarrhea, unspecified: Secondary | ICD-10-CM | POA: Diagnosis not present

## 2014-04-01 LAB — BASIC METABOLIC PANEL
BUN: 7 mg/dL (ref 6–23)
CHLORIDE: 108 meq/L (ref 96–112)
CO2: 17 mEq/L — ABNORMAL LOW (ref 19–32)
Calcium: 9.1 mg/dL (ref 8.4–10.5)
Creatinine, Ser: 0.64 mg/dL (ref 0.50–1.10)
GFR, EST NON AFRICAN AMERICAN: 80 mL/min — AB (ref >90)
Glucose, Bld: 77 mg/dL (ref 70–99)
Potassium: 4.1 mEq/L (ref 3.7–5.3)
Sodium: 140 mEq/L (ref 137–147)

## 2014-04-01 MED ORDER — SACCHAROMYCES BOULARDII 250 MG PO CAPS
250.0000 mg | ORAL_CAPSULE | Freq: Two times a day (BID) | ORAL | Status: DC
  Administered 2014-04-01 – 2014-04-04 (×7): 250 mg via ORAL
  Filled 2014-04-01 (×9): qty 1

## 2014-04-01 MED ORDER — CIPROFLOXACIN HCL 500 MG PO TABS
500.0000 mg | ORAL_TABLET | ORAL | Status: DC
  Filled 2014-04-01: qty 1

## 2014-04-01 MED ORDER — CIPROFLOXACIN HCL 500 MG PO TABS
500.0000 mg | ORAL_TABLET | Freq: Two times a day (BID) | ORAL | Status: DC
  Administered 2014-04-01 – 2014-04-04 (×6): 500 mg via ORAL
  Filled 2014-04-01 (×9): qty 1

## 2014-04-01 NOTE — Progress Notes (Signed)
PROGRESS NOTE  Amy Fischer ZHG:[id number] DOB: 11/15/1929 DOA: 03/30/2014 PCP: [name], MD  Amy Fischer is a 80 y.o. female who presents to the ED at [facility] with persistent diarrhea for the past 4 days. Initially she had N/V as well as the diarrhea when her symptoms onset on Monday, but the N/V stopped 3 days ago. Her diarrhea however has been persistent since that time. She thought she had the diarrhea under control this morning finally, but then it started to get worse this afternoon again. She tried to see her PCP but they didn't have an opening so she went to the ED at [facility].  HPI/Subjective: Feeling weak, continue to have loose bowel movements. Still orthostatic, continue IV fluids.  Assessment/Plan:  Acute Diarrhea Likely viral, patient continues to have diarrhea.  Although C. difficile PCR is negative, no fever or leukocytosis, continues to have loose stool overnight. Hold Protonix 80 mg as it might cause diarrhea as well.  Because of persistent diarrhea as well as ciprofloxacin and Florastor. Stool culture pending  Dehydration Patient was significantly dehydrated on admission.   She was started on IVF at 125 ml/hour and finally urinated approximately 15 hours later.  GERD On 80 mg protonix daily.  Hold  HLD On Zetia and Crestor.   DVT Prophylaxis:  heparin  Code Status: full Family Communication: son and daughter at bedside Disposition Plan: home likely 4/3    Antibiotics: Anti-infectives   Start     Dose/Rate Route Frequency Ordered Stop   04/01/14 1345  ciprofloxacin (CIPRO) tablet 500 mg     500 mg Oral 2 times daily 04/01/14 1333          Objective: Filed Vitals:   03/31/14 1619 03/31/14 2014 04/01/14 0514 04/01/14 1001  BP: 154/69 142/70 151/73 173/94  Pulse: 67 79 70 93  Temp: 97.9 F (36.6 C) 97.6 F (36.4 C) 97.4 F (36.3 C)   TempSrc: Oral Oral Oral   Resp: 18 18 18    Height:      Weight:      SpO2: 96% 99% 98% 98%     Intake/Output Summary (Last 24 hours) at 04/01/14 1334 Last data filed at 04/01/14 1100  Gross per 24 hour  Intake    222 ml  Output     12 ml  Net    210 ml   Filed Weights   03/30/14 1857  Weight: 54.432 kg (120 lb)    Exam: General: Well developed, well nourished, NAD, appears stated age  4:  PERR, EOMI, Anicteic Sclera, MMM. No pharyngeal erythema or exudates  Neck: Supple, no JVD, no masses  Cardiovascular: RRR, S1 S2 auscultated, no rubs, murmurs or gallops.   Respiratory: Clear to auscultation bilaterally with equal chest rise  Abdomen: Soft, nontender,mild to moderately distended, + very active bowel sounds  Extremities: warm dry without cyanosis clubbing or edema.  Neuro: AAOx3, cranial nerves grossly intact. Strength 5/5 in upper and lower extremities  Skin: Without rashes exudates or nodules.   Psych: Normal affect and demeanor with intact judgement and insight   Data Reviewed: Basic Metabolic Panel:  Recent Labs Lab 03/30/14 2004 03/31/14 0416 04/01/14 0630  NA 140 140 140  K 4.1 3.9 4.1  CL 103 108 108  CO2 22 20 17*  GLUCOSE 103* 79 77  BUN 25* 19 7  CREATININE 0.80 0.69 0.64  CALCIUM 10.0 8.6 9.1   Liver Function Tests:  Recent Labs Lab 03/30/14 2004  AST  26  ALT 21  ALKPHOS 66  BILITOT 0.5  PROT 7.3  ALBUMIN 3.7    Recent Labs Lab 03/30/14 2004  LIPASE 29   CBC:  Recent Labs Lab 03/30/14 2004 03/31/14 0416  WBC 6.2 5.6  NEUTROABS 3.5  --   HGB 15.0 12.5  HCT 44.3 36.8  MCV 96.5 96.1  PLT 201 164     Recent Results (from the past 240 hour(s))  CLOSTRIDIUM DIFFICILE BY PCR     Status: None   Collection Time    03/30/14  8:00 PM      Result Value Ref Range Status   C difficile by pcr NEGATIVE  NEGATIVE Final   Comment: Performed at [facility]     Studies: Dg Abd Acute W/chest  03/30/2014   CLINICAL DATA:  Emesis and diarrhea  EXAM: ACUTE ABDOMEN SERIES (ABDOMEN 2 VIEW & CHEST 1 VIEW)  COMPARISON:   Prior chest x-ray 01/03/2014  FINDINGS: Stable cardiac and mediastinal contours. Borderline cardiomegaly. Atherosclerotic calcifications in the mildly tortuous thoracic aorta. The lungs are clear. Chronic bronchitic changes and mild interstitial prominence remain unchanged. Focal eventration of the right hemidiaphragm. Remote healed left seventh rib fracture. No acute osseous abnormality. Surgical clips noted in the soft tissues of the left neck.  Surgical clips in the right upper quadrant consistent with prior cholecystectomy. Nonobstructive bowel gas pattern. Small air-fluid levels are noted in the colon. Gas is noted distally to the level of the rectum. No free air. No acute osseous abnormality. Multilevel degenerative change throughout the spine.  IMPRESSION: 1. No acute cardiopulmonary process. 2. Nonobstructed bowel gas pattern.  No evidence of free air.   Electronically Signed   By: Jacqulynn Cadet M.D.   On: 03/30/2014 20:55    Scheduled Meds: . aspirin  325 mg Oral Daily  . azelastine  1 spray Each Nare BID  . ciprofloxacin  500 mg Oral BID  . ezetimibe  10 mg Oral Daily  . ezetimibe  10 mg Oral Daily  . famotidine  20 mg Oral BID  . heparin  5,000 Units Subcutaneous 3 times per day  . levothyroxine  75 mcg Oral QAC breakfast  . loratadine  10 mg Oral Daily  . pantoprazole  80 mg Oral Daily  . rosuvastatin  5 mg Oral Daily  . saccharomyces boulardii  250 mg Oral BID   Continuous Infusions: . sodium chloride 50 mL/hr at 03/31/14 1554    Principal Problem:   Diarrhea Active Problems:   HYPERLIPIDEMIA   GERD   Dehydration    Meral Geissinger A, PA-C [facility] Pager [phone]. If 7PM-7AM, please contact night-coverage at [email or web address], password [facility] 04/01/2014, 1:34 PM  LOS: 2 days

## 2014-04-01 NOTE — Progress Notes (Signed)
Pt having frequent stools. Pt complaining of her bottom burning. Barrier cream was applied and the patient was instructed to stop wearing diapers due to increased irritation to bottom. Pt stated she wanted to continue to wear them.

## 2014-04-01 NOTE — Progress Notes (Addendum)
PT Cancellation Note  Patient Details Name: Amy Fischer MRN: [id number] DOB: 1929/11/03  @1512  Cancelled Treatment:    Reason Eval/Treat Not Completed: Other (comment) (pt having diarrhea and is on [facility] with RN).  PT to check back later today or tomorrow.     @1537  Pt does not feel she can attempt gait at this time as she is "attached to the commode".  She is agreeable to have PT check back tomorrow and reports that she has less diarrhea in the mornings.     Thanks,   Barbarann Ehlers. [place], [place], DPT [phone]   04/01/2014, 3:12 PM

## 2014-04-02 DIAGNOSIS — E860 Dehydration: Secondary | ICD-10-CM

## 2014-04-02 DIAGNOSIS — R42 Dizziness and giddiness: Secondary | ICD-10-CM | POA: Diagnosis not present

## 2014-04-02 DIAGNOSIS — I10 Essential (primary) hypertension: Secondary | ICD-10-CM

## 2014-04-02 DIAGNOSIS — R197 Diarrhea, unspecified: Secondary | ICD-10-CM | POA: Diagnosis not present

## 2014-04-02 DIAGNOSIS — E559 Vitamin D deficiency, unspecified: Secondary | ICD-10-CM

## 2014-04-02 LAB — URINALYSIS, ROUTINE W REFLEX MICROSCOPIC
Bilirubin Urine: NEGATIVE
Glucose, UA: NEGATIVE mg/dL
Hgb urine dipstick: NEGATIVE
KETONES UR: NEGATIVE mg/dL
LEUKOCYTES UA: NEGATIVE
NITRITE: NEGATIVE
PROTEIN: NEGATIVE mg/dL
Specific Gravity, Urine: 1.015 (ref 1.005–1.030)
UROBILINOGEN UA: 0.2 mg/dL (ref 0.0–1.0)
pH: 5.5 (ref 5.0–8.0)

## 2014-04-02 LAB — BASIC METABOLIC PANEL
BUN: 3 mg/dL — ABNORMAL LOW (ref 6–23)
CO2: 16 meq/L — AB (ref 19–32)
Calcium: 8.3 mg/dL — ABNORMAL LOW (ref 8.4–10.5)
Chloride: 112 mEq/L (ref 96–112)
Creatinine, Ser: 0.58 mg/dL (ref 0.50–1.10)
GFR calc Af Amer: >90 mL/min (ref >90)
GFR, EST NON AFRICAN AMERICAN: 83 mL/min — AB (ref >90)
Glucose, Bld: 82 mg/dL (ref 70–99)
Potassium: 3.4 mEq/L — ABNORMAL LOW (ref 3.7–5.3)
Sodium: 141 mEq/L (ref 137–147)

## 2014-04-02 LAB — TSH: TSH: 10.3 u[IU]/mL — AB (ref 0.350–4.500)

## 2014-04-02 MED ORDER — POTASSIUM CHLORIDE CRYS ER 20 MEQ PO TBCR
40.0000 meq | EXTENDED_RELEASE_TABLET | Freq: Once | ORAL | Status: AC
  Administered 2014-04-02: 40 meq via ORAL
  Filled 2014-04-02: qty 2

## 2014-04-02 MED ORDER — WHITE PETROLATUM GEL
Status: AC
  Administered 2014-04-02: 0.2
  Filled 2014-04-02: qty 5

## 2014-04-02 MED ORDER — DEXTROSE-NACL 5-0.45 % IV SOLN
INTRAVENOUS | Status: DC
  Administered 2014-04-02 – 2014-04-04 (×4): via INTRAVENOUS

## 2014-04-02 NOTE — Progress Notes (Signed)
Utilization review completed.  

## 2014-04-02 NOTE — Evaluation (Signed)
Physical Therapy Evaluation Patient Details Name: Amy Fischer MRN: [id number] DOB: October 21, 1929 Today's Date: 04/02/2014   History of Present Illness  pt adm with 4 day h/o diarrhea.  Patient also presents with dehydration.  Negative for c-diff  Clinical Impression  Patient did well with all mobility.  Ambulates with gait velocity of 1.8 ft/sec which indicates safe for household ambulation.  No further PT needs identified, will sign off.    Follow Up Recommendations No PT follow up    Equipment Recommendations  None recommended by PT    Recommendations for Other Services       Precautions / Restrictions Precautions Precautions: None      Mobility  Bed Mobility Overal bed mobility: Independent                Transfers Overall transfer level: Independent Equipment used: None                Ambulation/Gait Ambulation/Gait assistance: Independent Ambulation Distance (Feet): 150 Feet Assistive device: None Gait Pattern/deviations: WFL(Within Functional Limits) Gait velocity: 1.8 ft/sec Gait velocity interpretation: at or above normal speed for age/gender    Stairs            Wheelchair Mobility    Modified Rankin (Stroke Patients Only)       Balance Overall balance assessment: No apparent balance deficits (not formally assessed)             Standing balance comment: able to take perturbations with no loss of balance                             Pertinent Vitals/Pain Denies pain    Home Living Family/patient expects to be discharged to:: Private residence Living Arrangements: Alone Available Help at Discharge: Available PRN/intermittently;Family;Friend(s) Type of Home: Apartment Home Access: Stairs to enter   Entrance Stairs-Number of Steps: 1 Home Layout: One level Home Equipment: None      Prior Function Level of Independence: Independent               Hand Dominance        Extremity/Trunk Assessment    Upper Extremity Assessment: Overall WFL for tasks assessed           Lower Extremity Assessment: Overall WFL for tasks assessed      Cervical / Trunk Assessment: Normal  Communication   Communication: No difficulties  Cognition Arousal/Alertness: Awake/alert Behavior During Therapy: WFL for tasks assessed/performed Overall Cognitive Status: Within Functional Limits for tasks assessed                      General Comments      Exercises        Assessment/Plan    PT Assessment Patent does not need any further PT services  PT Diagnosis     PT Problem List    PT Treatment Interventions     PT Goals (Current goals can be found in the Care Plan section) Acute Rehab PT Goals PT Goal Formulation: No goals set, d/c therapy    Frequency     Barriers to discharge        Co-evaluation               End of Session   Activity Tolerance: Patient tolerated treatment well Patient left: in bed;with call bell/phone within reach (sitting EOB)      Functional Assessment Tool Used: clinical judgement Functional Limitation:  Mobility: Walking and moving around Mobility: Walking and Moving Around Current Status [phone]): At least 1 percent but less than 20 percent impaired, limited or restricted Mobility: Walking and Moving Around Goal Status [phone]): At least 1 percent but less than 20 percent impaired, limited or restricted Mobility: Walking and Moving Around Discharge Status [phone]): At least 1 percent but less than 20 percent impaired, limited or restricted    Time: 1123-1133 PT Time Calculation (min): 10 min   Charges:   PT Evaluation $Initial PT Evaluation Tier I: 1 Procedure     PT G Codes:   Functional Assessment Tool Used: clinical judgement Functional Limitation: Mobility: Walking and moving around    Shanna Cisco, [place] 04/02/2014, 11:37 AM

## 2014-04-02 NOTE — Progress Notes (Addendum)
PROGRESS NOTE  Amy Fischer ZJI:[id number] DOB: August 23, 1929 DOA: 03/30/2014 PCP: [name], MD  Amy Fischer is a 80 y.o. female who presents to the ED at [facility] with persistent diarrhea for the past 4 days. Initially she had N/V as well as the diarrhea when her symptoms onset on Monday, but the N/V stopped 3 days ago. Her diarrhea however has been persistent since that time. She thought she had the diarrhea under control this morning finally, but then it started to get worse this afternoon again. She tried to see her PCP but they didn't have an opening so she went to the ED at [facility].  HPI/Subjective: Still weak, less loose bowel movement yesterday. Improved appetite today.  Assessment/Plan:  Acute Diarrhea Likely viral, patient continues to have diarrhea.  Although C. difficile PCR is negative, no fever or leukocytosis, continues to have loose stool overnight. Hold Protonix 80 mg as it might cause diarrhea as well.  Because of persistent diarrhea, started ciprofloxacin and Florastor. Stool culture pending  Dehydration Patient was significantly dehydrated on admission.   Continue IV fluids, changed to D5/0.45 NS at 75 L.  GERD On 80 mg protonix daily.  Hold  HLD On Zetia and Crestor.   DVT Prophylaxis:  heparin  Code Status: full Family Communication: son and daughter at bedside Disposition Plan:     Antibiotics: Anti-infectives   Start     Dose/Rate Route Frequency Ordered Stop   04/01/14 2000  ciprofloxacin (CIPRO) tablet 500 mg     500 mg Oral 2 times daily 04/01/14 1337     04/01/14 1500  ciprofloxacin (CIPRO) tablet 500 mg  Status:  Discontinued     500 mg Oral Every 24 hours 04/01/14 1333 04/01/14 1337        Objective: Filed Vitals:   04/01/14 1001 04/01/14 1413 04/01/14 2239 04/02/14 0420  BP: 173/94 133/65 150/70 131/77  Pulse: 93 69 71 64  Temp:  97.4 F (36.3 C) 98.1 F (36.7 C) 98.2 F (36.8 C)  TempSrc:  Oral Oral Oral  Resp:  18  16 16   Height:      Weight:      SpO2: 98% 100% 99% 98%    Intake/Output Summary (Last 24 hours) at 04/02/14 1111 Last data filed at 04/02/14 0427  Gross per 24 hour  Intake      0 ml  Output      5 ml  Net     -5 ml   Filed Weights   03/30/14 1857  Weight: 54.432 kg (120 lb)    Exam: General: Well developed, well nourished, NAD, appears stated age  51:  PERR, EOMI, Anicteic Sclera, MMM. No pharyngeal erythema or exudates  Neck: Supple, no JVD, no masses  Cardiovascular: RRR, S1 S2 auscultated, no rubs, murmurs or gallops.   Respiratory: Clear to auscultation bilaterally with equal chest rise  Abdomen: Soft, nontender,mild to moderately distended, + very active bowel sounds  Extremities: warm dry without cyanosis clubbing or edema.  Neuro: AAOx3, cranial nerves grossly intact. Strength 5/5 in upper and lower extremities  Skin: Without rashes exudates or nodules.   Psych: Normal affect and demeanor with intact judgement and insight   Data Reviewed: Basic Metabolic Panel:  Recent Labs Lab 03/30/14 2004 03/31/14 0416 04/01/14 0630 04/02/14 0455  NA 140 140 140 141  K 4.1 3.9 4.1 3.4*  CL 103 108 108 112  CO2 22 20 17* 16*  GLUCOSE 103* 79 77 82  BUN 25* 19 7 3*  CREATININE 0.80 0.69 0.64 0.58  CALCIUM 10.0 8.6 9.1 8.3*   Liver Function Tests:  Recent Labs Lab 03/30/14 2004  AST 26  ALT 21  ALKPHOS 66  BILITOT 0.5  PROT 7.3  ALBUMIN 3.7    Recent Labs Lab 03/30/14 2004  LIPASE 29   CBC:  Recent Labs Lab 03/30/14 2004 03/31/14 0416  WBC 6.2 5.6  NEUTROABS 3.5  --   HGB 15.0 12.5  HCT 44.3 36.8  MCV 96.5 96.1  PLT 201 164     Recent Results (from the past 240 hour(s))  CLOSTRIDIUM DIFFICILE BY PCR     Status: None   Collection Time    03/30/14  8:00 PM      Result Value Ref Range Status   C difficile by pcr NEGATIVE  NEGATIVE Final   Comment: Performed at [facility]     Studies: No results found.  Scheduled Meds: .  aspirin  325 mg Oral Daily  . azelastine  1 spray Each Nare BID  . ciprofloxacin  500 mg Oral BID  . ezetimibe  10 mg Oral Daily  . famotidine  20 mg Oral BID  . heparin  5,000 Units Subcutaneous 3 times per day  . levothyroxine  75 mcg Oral QAC breakfast  . loratadine  10 mg Oral Daily  . rosuvastatin  5 mg Oral Daily  . saccharomyces boulardii  250 mg Oral BID   Continuous Infusions: . dextrose 5 % and 0.45% NaCl      Principal Problem:   Diarrhea Active Problems:   HYPERLIPIDEMIA   GERD   Dehydration    Rakin Lemelle A [facility] Pager [phone]. If 7PM-7AM, please contact night-coverage at [email or web address], password [facility] 04/02/2014, 11:11 AM  LOS: 3 days

## 2014-04-03 DIAGNOSIS — R197 Diarrhea, unspecified: Secondary | ICD-10-CM | POA: Diagnosis not present

## 2014-04-03 DIAGNOSIS — R42 Dizziness and giddiness: Secondary | ICD-10-CM | POA: Diagnosis not present

## 2014-04-03 DIAGNOSIS — E860 Dehydration: Secondary | ICD-10-CM | POA: Diagnosis not present

## 2014-04-03 DIAGNOSIS — R269 Unspecified abnormalities of gait and mobility: Secondary | ICD-10-CM | POA: Diagnosis not present

## 2014-04-03 LAB — BASIC METABOLIC PANEL
BUN: <3 mg/dL — ABNORMAL LOW (ref 6–23)
CALCIUM: 8.6 mg/dL (ref 8.4–10.5)
CO2: 20 mEq/L (ref 19–32)
CREATININE: 0.6 mg/dL (ref 0.50–1.10)
Chloride: 110 mEq/L (ref 96–112)
GFR calc non Af Amer: 82 mL/min — ABNORMAL LOW (ref >90)
Glucose, Bld: 99 mg/dL (ref 70–99)
Potassium: 3.3 mEq/L — ABNORMAL LOW (ref 3.7–5.3)
Sodium: 143 mEq/L (ref 137–147)

## 2014-04-03 MED ORDER — POTASSIUM CHLORIDE CRYS ER 20 MEQ PO TBCR
40.0000 meq | EXTENDED_RELEASE_TABLET | Freq: Four times a day (QID) | ORAL | Status: AC
  Administered 2014-04-03 (×2): 40 meq via ORAL
  Filled 2014-04-03 (×2): qty 2

## 2014-04-03 MED ORDER — LOPERAMIDE HCL 2 MG PO CAPS: 2.0000 mg | ORAL_CAPSULE | ORAL | Status: DC | PRN

## 2014-04-03 MED ORDER — LEVOTHYROXINE SODIUM 88 MCG PO TABS
88.0000 ug | ORAL_TABLET | Freq: Every day | ORAL | Status: DC
  Administered 2014-04-04: 88 ug via ORAL
  Filled 2014-04-03 (×2): qty 1

## 2014-04-03 NOTE — Progress Notes (Signed)
UR completed. Patient changed to inpatient- con't to require IVF @ 75cc/hr  

## 2014-04-03 NOTE — Progress Notes (Signed)
PROGRESS NOTE  OLIVEA SONNEN OJJ:[id number] DOB: 1929-09-08 DOA: 03/30/2014 PCP: [name], MD  Amy Fischer is a 80 y.o. female who presents to the ED at [facility] with persistent diarrhea for the past 4 days. Initially she had N/V as well as the diarrhea when her symptoms onset on Monday, but the N/V stopped 3 days ago. Her diarrhea however has been persistent since that time. She thought she had the diarrhea under control this morning finally, but then it started to get worse this afternoon again. She tried to see her PCP but they didn't have an opening so she went to the ED at [facility].  HPI/Subjective: Has about 15 bowel movements since yesterday morning, started on Imodium.  Assessment/Plan:  Acute Diarrhea Likely viral, patient continues to have diarrhea.  Although C. difficile PCR is negative, no fever or leukocytosis, continues to have loose stool overnight. Hold Protonix 80 mg as it might cause diarrhea as well.  Because of persistent diarrhea, started ciprofloxacin and Florastor. Stool culture pending  Dehydration Patient was significantly dehydrated on admission.   Continue IV fluids, changed to D5/0.45 NS at 75 L.  GERD On 80 mg protonix daily.  Hold  HLD On Zetia and Crestor.   DVT Prophylaxis:  heparin  Code Status: full Family Communication: son and daughter at bedside Disposition Plan:     Antibiotics: Anti-infectives   Start     Dose/Rate Route Frequency Ordered Stop   04/01/14 2000  ciprofloxacin (CIPRO) tablet 500 mg     500 mg Oral 2 times daily 04/01/14 1337     04/01/14 1500  ciprofloxacin (CIPRO) tablet 500 mg  Status:  Discontinued     500 mg Oral Every 24 hours 04/01/14 1333 04/01/14 1337        Objective: Filed Vitals:   04/02/14 0420 04/02/14 1521 04/02/14 2031 04/03/14 0445  BP: 131/77 132/68 144/70 135/80  Pulse: 64 67 65 72  Temp: 98.2 F (36.8 C) 97.7 F (36.5 C) 97.8 F (36.6 C) 97.6 F (36.4 C)  TempSrc: Oral Oral  Oral Oral  Resp: 16 18 18 18   Height:      Weight:      SpO2: 98% 98% 100% 97%    Intake/Output Summary (Last 24 hours) at 04/03/14 1309 Last data filed at 04/02/14 2355  Gross per 24 hour  Intake     75 ml  Output    300 ml  Net   -225 ml   Filed Weights   03/30/14 1857  Weight: 54.432 kg (120 lb)    Exam: General: Well developed, well nourished, NAD, appears stated age  81:  PERR, EOMI, Anicteic Sclera, MMM. No pharyngeal erythema or exudates  Neck: Supple, no JVD, no masses  Cardiovascular: RRR, S1 S2 auscultated, no rubs, murmurs or gallops.   Respiratory: Clear to auscultation bilaterally with equal chest rise  Abdomen: Soft, nontender,mild to moderately distended, + very active bowel sounds  Extremities: warm dry without cyanosis clubbing or edema.  Neuro: AAOx3, cranial nerves grossly intact. Strength 5/5 in upper and lower extremities  Skin: Without rashes exudates or nodules.   Psych: Normal affect and demeanor with intact judgement and insight   Data Reviewed: Basic Metabolic Panel:  Recent Labs Lab 03/30/14 2004 03/31/14 0416 04/01/14 0630 04/02/14 0455 04/03/14 0659  NA 140 140 140 141 143  K 4.1 3.9 4.1 3.4* 3.3*  CL 103 108 108 112 110  CO2 22 20 17* 16* 20  GLUCOSE  103* 79 77 82 99  BUN 25* 19 7 3* <3*  CREATININE 0.80 0.69 0.64 0.58 0.60  CALCIUM 10.0 8.6 9.1 8.3* 8.6   Liver Function Tests:  Recent Labs Lab 03/30/14 2004  AST 26  ALT 21  ALKPHOS 66  BILITOT 0.5  PROT 7.3  ALBUMIN 3.7    Recent Labs Lab 03/30/14 2004  LIPASE 29   CBC:  Recent Labs Lab 03/30/14 2004 03/31/14 0416  WBC 6.2 5.6  NEUTROABS 3.5  --   HGB 15.0 12.5  HCT 44.3 36.8  MCV 96.5 96.1  PLT 201 164     Recent Results (from the past 240 hour(s))  CLOSTRIDIUM DIFFICILE BY PCR     Status: None   Collection Time    03/30/14  8:00 PM      Result Value Ref Range Status   C difficile by pcr NEGATIVE  NEGATIVE Final   Comment: Performed at [facility]     Status: None   Collection Time    03/31/14  3:59 PM      Result Value Ref Range Status   Specimen Description STOOL   Final   Special Requests NONE   Final   Culture     Final   Value: Culture reincubated for better growth     Performed at [facility]   Report Status PENDING   Incomplete     Studies: No results found.  Scheduled Meds: . aspirin  325 mg Oral Daily  . azelastine  1 spray Each Nare BID  . ciprofloxacin  500 mg Oral BID  . ezetimibe  10 mg Oral Daily  . famotidine  20 mg Oral BID  . heparin  5,000 Units Subcutaneous 3 times per day  . [START ON 04/04/2014] levothyroxine  88 mcg Oral QAC breakfast  . loratadine  10 mg Oral Daily  . potassium chloride  40 mEq Oral Q6H  . rosuvastatin  5 mg Oral Daily  . saccharomyces boulardii  250 mg Oral BID   Continuous Infusions: . dextrose 5 % and 0.45% NaCl 75 mL/hr at 04/03/14 0340    Principal Problem:   Diarrhea Active Problems:   HYPERLIPIDEMIA   GERD   Dehydration    [facility] A [facility] Pager [phone]. If 7PM-7AM, please contact night-coverage at [email or web address], password [facility] 04/03/2014, 1:09 PM  LOS: 4 days

## 2014-04-04 DIAGNOSIS — R197 Diarrhea, unspecified: Secondary | ICD-10-CM | POA: Diagnosis not present

## 2014-04-04 DIAGNOSIS — K219 Gastro-esophageal reflux disease without esophagitis: Secondary | ICD-10-CM

## 2014-04-04 DIAGNOSIS — E860 Dehydration: Secondary | ICD-10-CM | POA: Diagnosis not present

## 2014-04-04 DIAGNOSIS — R42 Dizziness and giddiness: Secondary | ICD-10-CM | POA: Diagnosis not present

## 2014-04-04 LAB — BASIC METABOLIC PANEL
BUN: 3 mg/dL — ABNORMAL LOW (ref 6–23)
CALCIUM: 9 mg/dL (ref 8.4–10.5)
CO2: 22 mEq/L (ref 19–32)
Chloride: 110 mEq/L (ref 96–112)
Creatinine, Ser: 0.67 mg/dL (ref 0.50–1.10)
GFR calc Af Amer: >90 mL/min (ref >90)
GFR, EST NON AFRICAN AMERICAN: 79 mL/min — AB (ref >90)
Glucose, Bld: 91 mg/dL (ref 70–99)
Potassium: 4.2 mEq/L (ref 3.7–5.3)
Sodium: 144 mEq/L (ref 137–147)

## 2014-04-04 LAB — STOOL CULTURE

## 2014-04-04 MED ORDER — LOPERAMIDE HCL 2 MG PO CAPS: 2.0000 mg | ORAL_CAPSULE | ORAL | Status: DC | PRN

## 2014-04-04 MED ORDER — POTASSIUM CHLORIDE CRYS ER 20 MEQ PO TBCR: 40.0000 meq | EXTENDED_RELEASE_TABLET | Freq: Once | ORAL | Status: DC

## 2014-04-04 MED ORDER — CIPROFLOXACIN HCL 500 MG PO TABS: 500.0000 mg | ORAL_TABLET | Freq: Two times a day (BID) | ORAL | Status: DC

## 2014-04-04 MED ORDER — FAMOTIDINE 40 MG PO TABS: 40.0000 mg | ORAL_TABLET | Freq: Two times a day (BID) | ORAL | Status: DC

## 2014-04-04 MED ORDER — SACCHAROMYCES BOULARDII 250 MG PO CAPS: 250.0000 mg | ORAL_CAPSULE | Freq: Two times a day (BID) | ORAL | Status: DC

## 2014-04-04 NOTE — Progress Notes (Signed)
Amy Fischer to be D/C'd Home per MD order.  Discussed with the patient and all questions fully answered.    Medication List    STOP taking these medications       azithromycin 250 MG tablet  Commonly known as:  ZITHROMAX Z-PAK     docusate sodium 100 MG capsule  Commonly known as:  COLACE     omeprazole 40 MG capsule  Commonly known as:  PRILOSEC     ranitidine 300 MG capsule  Commonly known as:  ZANTAC  Replaced by:  famotidine 40 MG tablet      TAKE these medications       aspirin 325 MG tablet  Take 325 mg by mouth daily.     azelastine 137 MCG/SPRAY nasal spray  Commonly known as:  ASTELIN  Place 2 sprays into both nostrils 2 (two) times daily. Use in each nostril as directed     calcium-vitamin D 500-200 MG-UNIT per tablet  Commonly known as:  OSCAL WITH D  Take 1 tablet by mouth daily.     ciprofloxacin 500 MG tablet  Commonly known as:  CIPRO  Take 1 tablet (500 mg total) by mouth 2 (two) times daily.     ezetimibe 10 MG tablet  Commonly known as:  ZETIA  Take 10 mg by mouth daily.     famotidine 40 MG tablet  Commonly known as:  PEPCID  Take 1 tablet (40 mg total) by mouth 2 (two) times daily.     fexofenadine 180 MG tablet  Commonly known as:  ALLEGRA  Take 180 mg by mouth daily.     fish oil-omega-3 fatty acids 1000 MG capsule  Take 1 g by mouth daily.     glucosamine-chondroitin 500-400 MG tablet  Take 1 tablet by mouth daily.     ibuprofen 200 MG tablet  Commonly known as:  ADVIL,MOTRIN  Take 200 mg by mouth every 6 (six) hours as needed. Patient used this medication for a headache.     levothyroxine 75 MCG tablet  Commonly known as:  SYNTHROID, LEVOTHROID  Take 75 mcg by mouth daily before breakfast.     loperamide 2 MG capsule  Commonly known as:  IMODIUM  Take 1 capsule (2 mg total) by mouth as needed for diarrhea or loose stools (Do NOT exceed 16 mg per day).     Melatonin 3 MG Tabs  Take 1 tablet by mouth every other day.     multivitamin per tablet  Take 1 tablet by mouth daily.     nebivolol 5 MG tablet  Commonly known as:  BYSTOLIC  Take 7.90 mg by mouth daily.     nebivolol 2.5 MG tablet  Commonly known as:  BYSTOLIC  Take 0.5 tablets (1.25 mg total) by mouth daily. Resume bystolic on 4/4 when you are able to eat and drink more normally.     nitroGLYCERIN 0.4 MG SL tablet  Commonly known as:  NITROSTAT  Place 0.4 mg under the tongue every 5 (five) minutes as needed for chest pain.     ranitidine 300 MG tablet  Commonly known as:  ZANTAC  Take 300 mg by mouth 2 (two) times daily.     rosuvastatin 5 MG tablet  Commonly known as:  CRESTOR  Take 2.5 mg by mouth daily.     saccharomyces boulardii 250 MG capsule  Commonly known as:  FLORASTOR  Take 1 capsule (250 mg total) by mouth 2 (two) times daily.  Vitamin D3 1000 UNITS Caps  Take 1 capsule by mouth daily.        VVS, Skin clean, dry and intact without evidence of skin break down, no evidence of skin tears noted. IV catheter discontinued intact. Site without signs and symptoms of complications. Dressing and pressure applied.  An After Visit Summary was printed and given to the patient.  D/c education completed with patient/family including follow up instructions, medication list, d/c activities limitations if indicated, with other d/c instructions as indicated by MD - patient able to verbalize understanding, all questions fully answered.   Patient instructed to return to ED, call 911, or call MD for any changes in condition.   Patient escorted via Amy Fischer, and D/C home via private auto.  Amy Fischer 04/04/2014 1:04 PM

## 2014-04-04 NOTE — Discharge Summary (Signed)
Physician Discharge Summary  Amy Fischer WUJ:[id number] DOB: 10-17-1929 DOA: 03/30/2014  PCP: [name], MD  Admit date: 03/30/2014 Discharge date: 04/04/2014  Time spent: 45 minutes  Recommendations for Outpatient Follow-up:  1. Check bmet at 1 week follow up.  Determine if patient needs to restart stool softeners for constipation 2. If still having Diarrhea at 1 week follow up please refer to Dr. Olevia Perches with GI.  She will need colonoscopy.  Discharge Diagnoses:  Principal Problem:   Diarrhea Active Problems:   HYPERLIPIDEMIA   GERD   Dehydration   Discharge Condition: stable.    Diet recommendation: regular diet.  Filed Weights   03/30/14 1857  Weight: 54.432 kg (120 lb)    History of present illness:  Amy Fischer is a 80 y.o. female who presents to the ED at [facility] with persistent diarrhea for the past 4 days. Initially she had N/V as well as the diarrhea when her symptoms onset on Monday, but the N/V stopped 3 days ago. Her diarrhea however has been persistent since that time.   Hospital Course:  Acute Diarrhea  Likely viral, although patient's symptoms have lasted longer than expected.  C. difficile PCR is negative, no fever or leukocytosis PPI  was discontinued as patient's diarrhea was so persistent. Because of persistent diarrhea, started ciprofloxacin and Florastor on 4/4.  Stool culture was negative.  Dehydration  Patient was significantly dehydrated on admission.  Continue IV fluids, changed to D5/0.45 NS at 75 L.  Now resolved.  GERD  D/c Omeprazole.  Started high dose H2 blocker.   HLD  On Zetia and Crestor.   Discharge Exam: Filed Vitals:   04/04/14 0444  BP: 143/78  Pulse: 64  Temp: 98.2 F (36.8 C)  Resp: 18   General: Well developed, thin , NAD, appears stated age, sitting on the side of the bed. HEENT:,  Anicteic Sclera, MMM. No pharyngeal erythema or exudates  Neck: Supple, no JVD, no masses  Cardiovascular: RRR, S1 S2  auscultated, no rubs, murmurs or gallops.  Respiratory: Clear to auscultation bilaterally with equal chest rise  Abdomen: Soft, nontender,non distended, + very active bowel sounds  Extremities: warm dry without cyanosis clubbing or edema.  Neuro: AAOx3, cranial nerves grossly intact. Strength 5/5 in upper and lower extremities  Skin: Without rashes exudates or nodules.  Psych: Normal affect and demeanor with intact judgement and insight          Discharge Orders   Future Appointments Provider Department Dept Phone   05/16/2014 9:15 AM Burnis Medin, MD [place] [facility] [place]   Future Orders Complete By Expires   Diet - low sodium heart healthy  As directed    Diet - low sodium heart healthy  As directed    Increase activity slowly  As directed    Increase activity slowly  As directed        Medication List    STOP taking these medications       azithromycin 250 MG tablet  Commonly known as:  ZITHROMAX Z-PAK     docusate sodium 100 MG capsule  Commonly known as:  COLACE     omeprazole 40 MG capsule  Commonly known as:  PRILOSEC     ranitidine 300 MG capsule  Commonly known as:  ZANTAC  Replaced by:  famotidine 40 MG tablet      TAKE these medications       aspirin 325 MG tablet  Take 325 mg by mouth  daily.     azelastine 137 MCG/SPRAY nasal spray  Commonly known as:  ASTELIN  Place 2 sprays into both nostrils 2 (two) times daily. Use in each nostril as directed     calcium-vitamin D 500-200 MG-UNIT per tablet  Commonly known as:  OSCAL WITH D  Take 1 tablet by mouth daily.     ciprofloxacin 500 MG tablet  Commonly known as:  CIPRO  Take 1 tablet (500 mg total) by mouth 2 (two) times daily.     ezetimibe 10 MG tablet  Commonly known as:  ZETIA  Take 10 mg by mouth daily.     famotidine 40 MG tablet  Commonly known as:  PEPCID  Take 1 tablet (40 mg total) by mouth 2 (two) times daily.     fexofenadine 180 MG tablet  Commonly  known as:  ALLEGRA  Take 180 mg by mouth daily.     fish oil-omega-3 fatty acids 1000 MG capsule  Take 1 g by mouth daily.     glucosamine-chondroitin 500-400 MG tablet  Take 1 tablet by mouth daily.     ibuprofen 200 MG tablet  Commonly known as:  ADVIL,MOTRIN  Take 200 mg by mouth every 6 (six) hours as needed. Patient used this medication for a headache.     levothyroxine 75 MCG tablet  Commonly known as:  SYNTHROID, LEVOTHROID  Take 75 mcg by mouth daily before breakfast.     loperamide 2 MG capsule  Commonly known as:  IMODIUM  Take 1 capsule (2 mg total) by mouth as needed for diarrhea or loose stools (Do NOT exceed 16 mg per day).     Melatonin 3 MG Tabs  Take 1 tablet by mouth every other day.     multivitamin per tablet  Take 1 tablet by mouth daily.     nebivolol 5 MG tablet  Commonly known as:  BYSTOLIC  Take 3.53 mg by mouth daily.     nebivolol 2.5 MG tablet  Commonly known as:  BYSTOLIC  Take 0.5 tablets (1.25 mg total) by mouth daily. Resume bystolic on 4/4 when you are able to eat and drink more normally.     nitroGLYCERIN 0.4 MG SL tablet  Commonly known as:  NITROSTAT  Place 0.4 mg under the tongue every 5 (five) minutes as needed for chest pain.     ranitidine 300 MG tablet  Commonly known as:  ZANTAC  Take 300 mg by mouth 2 (two) times daily.     rosuvastatin 5 MG tablet  Commonly known as:  CRESTOR  Take 2.5 mg by mouth daily.     saccharomyces boulardii 250 MG capsule  Commonly known as:  FLORASTOR  Take 1 capsule (250 mg total) by mouth 2 (two) times daily.     Vitamin D3 1000 UNITS Caps  Take 1 capsule by mouth daily.       Allergies  Allergen Reactions  . Augmentin [Amoxicillin-Pot Clavulanate] Diarrhea    Not allergic  2014  . Codeine Nausea And Vomiting  . Risedronate Sodium     unknown  . Statins     Muscles hurt    Follow-up Information   Follow up with [name], MD In 1 week.   Specialty:  Internal Medicine    Contact information:   [place] [address] [phone]        The results of significant diagnostics from this hospitalization (including imaging, microbiology, ancillary and laboratory) are listed below  for reference.    Significant Diagnostic Studies: Dg Abd Acute W/chest  03/30/2014   CLINICAL DATA:  Emesis and diarrhea  EXAM: ACUTE ABDOMEN SERIES (ABDOMEN 2 VIEW & CHEST 1 VIEW)  COMPARISON:  Prior chest x-ray 01/03/2014  FINDINGS: Stable cardiac and mediastinal contours. Borderline cardiomegaly. Atherosclerotic calcifications in the mildly tortuous thoracic aorta. The lungs are clear. Chronic bronchitic changes and mild interstitial prominence remain unchanged. Focal eventration of the right hemidiaphragm. Remote healed left seventh rib fracture. No acute osseous abnormality. Surgical clips noted in the soft tissues of the left neck.  Surgical clips in the right upper quadrant consistent with prior cholecystectomy. Nonobstructive bowel gas pattern. Small air-fluid levels are noted in the colon. Gas is noted distally to the level of the rectum. No free air. No acute osseous abnormality. Multilevel degenerative change throughout the spine.  IMPRESSION: 1. No acute cardiopulmonary process. 2. Nonobstructed bowel gas pattern.  No evidence of free air.   Electronically Signed   By: Jacqulynn Cadet M.D.   On: 03/30/2014 20:55    Microbiology: Recent Results (from the past 240 hour(s))  CLOSTRIDIUM DIFFICILE BY PCR     Status: None   Collection Time    03/30/14  8:00 PM      Result Value Ref Range Status   C difficile by pcr NEGATIVE  NEGATIVE Final   Comment: Performed at [facility]     Status: None   Collection Time    03/31/14  3:59 PM      Result Value Ref Range Status   Specimen Description STOOL   Final   Special Requests NONE   Final   Culture     Final   Value: NO SALMONELLA, SHIGELLA, CAMPYLOBACTER, YERSINIA, OR E.COLI 0157:H7  ISOLATED     Performed at [facility]   Report Status 04/04/2014 FINAL   Final     Labs: Basic Metabolic Panel:  Recent Labs Lab 03/31/14 0416 04/01/14 0630 04/02/14 0455 04/03/14 0659 04/04/14 0638  NA 140 140 141 143 144  K 3.9 4.1 3.4* 3.3* 4.2  CL 108 108 112 110 110  CO2 20 17* 16* 20 22  GLUCOSE 79 77 82 99 91  BUN 19 7 3* <3* 3*  CREATININE 0.69 0.64 0.58 0.60 0.67  CALCIUM 8.6 9.1 8.3* 8.6 9.0   Liver Function Tests:  Recent Labs Lab 03/30/14 2004  AST 26  ALT 21  ALKPHOS 66  BILITOT 0.5  PROT 7.3  ALBUMIN 3.7    Recent Labs Lab 03/30/14 2004  LIPASE 29   CBC:  Recent Labs Lab 03/30/14 2004 03/31/14 0416  WBC 6.2 5.6  NEUTROABS 3.5  --   HGB 15.0 12.5  HCT 44.3 36.8  MCV 96.5 96.1  PLT 201 164    SignedKaren Kitchens [phone]  [facility] 04/04/2014, 11:36 AM

## 2014-04-04 NOTE — Discharge Summary (Signed)
Addendum  Patient seen and examined, chart and data base reviewed.  I agree with the above assessment and plan.  For full details please see Mrs. Imogene Burn PA note.  Diarrhea, negative for C. difficile and negative stools culture.  Followup with primary GI, discharged home on ciprofloxacin empirically.   Birdie Hopes, MD [facility] Pager: [phone] 04/04/2014, 1:02 PM

## 2014-04-10 ENCOUNTER — Ambulatory Visit (INDEPENDENT_AMBULATORY_CARE_PROVIDER_SITE_OTHER): Payer: Medicare Other | Admitting: Internal Medicine

## 2014-04-10 ENCOUNTER — Encounter: Payer: Self-pay | Admitting: Internal Medicine

## 2014-04-10 VITALS — BP 130/60 | Temp 98.0°F | Ht 61.75 in | Wt 122.0 lb

## 2014-04-10 DIAGNOSIS — R197 Diarrhea, unspecified: Secondary | ICD-10-CM

## 2014-04-10 DIAGNOSIS — Z87898 Personal history of other specified conditions: Secondary | ICD-10-CM

## 2014-04-10 DIAGNOSIS — I10 Essential (primary) hypertension: Secondary | ICD-10-CM | POA: Diagnosis not present

## 2014-04-10 DIAGNOSIS — Z09 Encounter for follow-up examination after completed treatment for conditions other than malignant neoplasm: Secondary | ICD-10-CM

## 2014-04-10 LAB — BASIC METABOLIC PANEL
BUN: 14 mg/dL (ref 6–23)
CALCIUM: 9.3 mg/dL (ref 8.4–10.5)
CO2: 27 mEq/L (ref 19–32)
Chloride: 104 mEq/L (ref 96–112)
Creatinine, Ser: 0.6 mg/dL (ref 0.4–1.2)
GFR: 97.7 mL/min (ref >60.00)
Glucose, Bld: 80 mg/dL (ref 70–99)
Potassium: 4.2 mEq/L (ref 3.5–5.1)
SODIUM: 138 meq/L (ref 135–145)

## 2014-04-10 NOTE — Progress Notes (Signed)
Chief Complaint  Patient presents with  . Follow-up    Hospital.    HPI: Patient comes in today as follow up from hospitalization forNV and then diarrhea  Had hypokalemia  Neg stool cx and cdiff by pcr   (No check for crytpo or giardia.  roto or noro) Not vomiting   Food  And yellow bile. Given cipro for " persistent diarrhea" and given probiotic fluoro stat running out . Doing much better and now eating solid normal food. diarrhea  and dehydration. Taken off hihg dose ppi temporarily to make sure wasn't causing her symptoms Finish ed cipro .  Pretty normal now. Stool this am no blood no dairrhea today ate normally last days Went back on omeprazole   Stopped in hospital  Once a day .  Below  in summary Admit date: 03/30/2014  Discharge date: 04/04/2014  Time spent: 45 minutes  Recommendations for Outpatient Follow-up:  1. Check bmet at 1 week follow up. Determine if patient needs to restart stool softeners for constipation 2. If still having Diarrhea at 1 week follow up please refer to Dr. Olevia Perches with GI. She will need colonoscopy. Discharge Diagnoses:  Principal Problem:  Diarrhea  Active Problems:  HYPERLIPIDEMIA  GERD  Dehydration  Discharge Condition: stable.  Diet recommendation: regular diet.  ROS: See pertinent positives and negatives per HPI. Bit tired but otherwise feels back to baseline. Had nasolacrimal ducts washed out [facility]  Dr. wanted to do eyelid surgery plastic surgeon she was told her she wasn't interested. Balan   Doing thyroid  Dose has changes and to do level  soon.  Past Medical History  Diagnosis Date  . GERD (gastroesophageal reflux disease)   . Hyperlipidemia   . Hypothyroidism   . Osteoarthritis   . Osteoporosis   . Episodic recurrent vertigo     MRI Head 2003  . Allergy   . Bladder polyps   . Subclavian steal syndrome     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; CTA 2013 revealed open graft  . Hyperplastic colon polyp   . Esophageal  spasm   . History of hepatitis     unknown type  . Asymptomatic carotid artery stenosis     R ICA 40% stenosis on CTA 12/2011.  [name] Anemia   . Cataract     BILATERAL-REMOVED  . Elevated blood pressure 03/25/2011    Bp readings borderline today has hx of elvation  In office and ok at home   Not checke recently   She gfeels was elevated from anxiety    . Diverticulosis   . Intestinal metaplasia of gastric mucosa   . Hepatic hemangioma     Family History  Problem Relation Age of Onset  . Hypertension Mother   . Heart disease Father   . Stroke Mother   . Colon cancer Neg Hx     History   Social History  . Marital Status: Widowed    Spouse Name: N/A    Number of Children: 4  . Years of Education: N/A   Occupational History  . retired    Social History Main Topics  . Smoking status: Former Smoker    Quit date: 01/05/1991  . Smokeless tobacco: Never Used  . Alcohol Use: 2.4 oz/week    4 Glasses of wine per week     Comment: socially  . Drug Use: No  . Sexual Activity: None   Other Topics Concern  . None   Social History  Narrative   Widowed   [place] of 1    No pets   Former smoker   Exercises regularly    Outpatient Encounter Prescriptions as of 04/10/2014  Medication Sig  . aspirin 325 MG tablet Take 325 mg by mouth daily.    [name] azelastine (ASTELIN) 137 MCG/SPRAY nasal spray Place 2 sprays into both nostrils 2 (two) times daily. Use in each nostril as directed  . calcium-vitamin D (OSCAL WITH D) 500-200 MG-UNIT per tablet Take 1 tablet by mouth daily.   . Cholecalciferol (VITAMIN D3) 1000 UNITS CAPS Take 1 capsule by mouth daily.   [name] ezetimibe (ZETIA) 10 MG tablet Take 10 mg by mouth daily.  . fexofenadine (ALLEGRA) 180 MG tablet Take 180 mg by mouth daily.  . fish oil-omega-3 fatty acids 1000 MG capsule Take 1 g by mouth daily.   [name] glucosamine-chondroitin 500-400 MG tablet Take 1 tablet by mouth daily.   [name] ibuprofen (ADVIL,MOTRIN) 200 MG tablet Take 200 mg by mouth  every 6 (six) hours as needed. Patient used this medication for a headache.  . levothyroxine (SYNTHROID, LEVOTHROID) 75 MCG tablet Take 75 mcg by mouth daily before breakfast.  . Melatonin 3 MG TABS Take 1 tablet by mouth every other day.   . multivitamin (THERAGRAN) per tablet Take 1 tablet by mouth daily.    . nebivolol (BYSTOLIC) 2.5 MG tablet Take 0.5 tablets (1.25 mg total) by mouth daily. Resume bystolic on 4/4 when you are able to eat and drink more normally.  . nitroGLYCERIN (NITROSTAT) 0.4 MG SL tablet Place 0.4 mg under the tongue every 5 (five) minutes as needed for chest pain.  [name] omeprazole (PRILOSEC) 40 MG capsule Take 40 mg by mouth daily.  . rosuvastatin (CRESTOR) 5 MG tablet Take 2.5 mg by mouth daily.  [name] saccharomyces boulardii (FLORASTOR) 250 MG capsule Take 1 capsule (250 mg total) by mouth 2 (two) times daily.  . [DISCONTINUED] famotidine (PEPCID) 40 MG tablet Take 1 tablet (40 mg total) by mouth 2 (two) times daily.  . [DISCONTINUED] loperamide (IMODIUM) 2 MG capsule Take 1 capsule (2 mg total) by mouth as needed for diarrhea or loose stools (Do NOT exceed 16 mg per day).  . [DISCONTINUED] nebivolol (BYSTOLIC) 5 MG tablet Take 1.25 mg by mouth daily.  . [DISCONTINUED] ranitidine (ZANTAC) 300 MG tablet Take 300 mg by mouth 2 (two) times daily.    EXAM:  BP 130/60  Temp(Src) 98 F (36.7 C) (Oral)  Ht 5' 1.75" (1.568 m)  Wt 122 lb (55.339 kg)  BMI 22.51 kg/m2  Body mass index is 22.51 kg/(m^2).  GENERAL: vitals reviewed and listed above, alert, oriented, appears well hydrated and in no acute distress HEENT: atraumatic, conjunctiva  clear, no obvious abnormalities on inspection of external nose and ears NECK: no obvious masses on inspection palpation  LUNGS: clear to auscultation bilaterally, no wheezes, rales or rhonchi,  CV: HRRR, no clubbing cyanosis or  peripheral edema nl cap refill  Abdomen:  Sof,t normal bowel sounds without hepatosplenomegaly, no guarding  rebound or masses no CVA tenderness MS: moves all extremities without noticeable focal  Abnormality OA changes  PSYCH: pleasant and cooperative, no obvious depression or anxiety  ASSESSMENT AND PLAN:  Discussed the following assessment and plan:  Diarrhea - convalescing llokd good to day bowel back to baseline but still tired to be expected  - Plan: Basic metabolic panel  Hypertension, isolated systolic - Plan: Basic metabolic panel  Hx of vomiting - on  ppi therapy  back to omeprazole q d   Hospital discharge follow-up  Doing better felt to be interim illness viral tests not checked. As usual hospital team has wiped out prescribing providers for her routine medications we will updated as week ago I believe Dr. Olevia Perches is doing omeprazole and Dr. Sharma Covert is doing Synthroid  Repeat chemistries today to rule out hypokalemia is stable and she continues back to baseline keep her to her appointment if not followup would get Dr. Olevia Perches to see her earlier. But she is doing well.  -Patient advised to return or notify health care team  if symptoms worsen ,persist or new concerns arise.  Patient Instructions  Continue  As you are doing  Will notify you  of labs when available. If recurrent we will get dr Olevia Perches involved earlier than MAY. Glad you  are doing better .    [name]. Panosh M.D.  hosp assessment on admission  Acute Diarrhea  Likely viral, patient continues to have diarrhea.  Although C. difficile PCR is negative, no fever or leukocytosis, continues to have loose stool overnight.  Hold Protonix 80 mg as it might cause diarrhea as well.  Because of persistent diarrhea as well as ciprofloxacin and Florastor.  Stool culture pending  Dehydration  Patient was significantly dehydrated on admission.  She was started on IVF at 125 ml/hour and finally urinated approximately 15 hours later.  GERD  On 80 mg protonix daily. Hold  HLD  On Zetia and Crestor.  DVT Prophylaxis: heparin    Code Status: full  Family Communication: son and daughter at bedside  Disposition Plan: home likely 4/3  Pre visit review using our clinic review tool, if applicable. No additional management support is needed unless otherwise documented below in the visit note.

## 2014-04-10 NOTE — Patient Instructions (Signed)
Continue  As you are doing  Will notify you  of labs when available. If recurrent we will get dr Olevia Perches involved earlier than MAY. Glad you  are doing better .

## 2014-04-12 ENCOUNTER — Encounter: Payer: Self-pay | Admitting: Family Medicine

## 2014-05-01 ENCOUNTER — Telehealth: Payer: Self-pay | Admitting: *Deleted

## 2014-05-01 DIAGNOSIS — K769 Liver disease, unspecified: Secondary | ICD-10-CM

## 2014-05-01 DIAGNOSIS — N289 Disorder of kidney and ureter, unspecified: Secondary | ICD-10-CM

## 2014-05-01 NOTE — Telephone Encounter (Signed)
I have spoken to patient to advise that she is due for CT abdomen for follow up of liver and renal lesion. I have spoken to [facility] CT. She states that patient should actually be scheduled for CT angiogram abdomen for evaluation of these lesions. She has changed our order to reflect this. Patient is advised that she will be having a CT at 11 am on 05/09/14. She has also been advised of the prep and verbalizes understanding to come pick up contrast.

## 2014-05-01 NOTE — Telephone Encounter (Signed)
Message copied by Larina Bras on Mon May 01, 2014  8:13 AM ------      Message from: Larina Bras      Created: Tue Dec 20, 2013 10:14 AM       Patient needs follow up CT abdomen (abdomen only per dr Olevia Perches) for f/u liver lesion and right renal lesion in May 2015; see office note dated 12/20/13.       ------

## 2014-05-04 ENCOUNTER — Other Ambulatory Visit: Payer: Self-pay | Admitting: Internal Medicine

## 2014-05-04 NOTE — Telephone Encounter (Signed)
Please review lab work on 04/02/14

## 2014-05-04 NOTE — Telephone Encounter (Signed)
Ok to refill x 90 days    Her last tsh was abnormal but that was at hospital  And she may have been off her med   If she is taking reg for 2 months then we can do labs Fasting lipid lft and tsh ,bmp TSH keep her July appt.

## 2014-05-05 ENCOUNTER — Telehealth: Payer: Self-pay | Admitting: Family Medicine

## 2014-05-05 DIAGNOSIS — E039 Hypothyroidism, unspecified: Secondary | ICD-10-CM

## 2014-05-05 DIAGNOSIS — R739 Hyperglycemia, unspecified: Secondary | ICD-10-CM

## 2014-05-05 DIAGNOSIS — E785 Hyperlipidemia, unspecified: Secondary | ICD-10-CM

## 2014-05-05 DIAGNOSIS — I10 Essential (primary) hypertension: Secondary | ICD-10-CM

## 2014-05-05 NOTE — Telephone Encounter (Signed)
This patient has an appt with Dr. Regis Bill on 07/26/14.  She will need to have fasting lab work before she comes in that day. I have placed the orders.  Please call the patient and make an appt.  Thanks!

## 2014-05-05 NOTE — Telephone Encounter (Signed)
lmom for pt to cb to sch labs

## 2014-05-08 NOTE — Telephone Encounter (Signed)
Lab appt has been scheduled for pt

## 2014-05-09 ENCOUNTER — Ambulatory Visit (INDEPENDENT_AMBULATORY_CARE_PROVIDER_SITE_OTHER)
Admission: RE | Admit: 2014-05-09 | Discharge: 2014-05-09 | Disposition: A | Discharge status: Home / Self Care | Payer: Medicare Other | Source: Ambulatory Visit | Attending: Internal Medicine | Admitting: Internal Medicine

## 2014-05-09 DIAGNOSIS — K7689 Other specified diseases of liver: Secondary | ICD-10-CM

## 2014-05-09 DIAGNOSIS — N289 Disorder of kidney and ureter, unspecified: Secondary | ICD-10-CM

## 2014-05-09 DIAGNOSIS — N281 Cyst of kidney, acquired: Secondary | ICD-10-CM | POA: Diagnosis not present

## 2014-05-09 DIAGNOSIS — K769 Liver disease, unspecified: Secondary | ICD-10-CM

## 2014-05-09 MED ORDER — IOHEXOL 350 MG/ML SOLN
100.0000 mL | Freq: Once | INTRAVENOUS | Status: AC | PRN
  Administered 2014-05-09: 100 mL via INTRAVENOUS

## 2014-05-10 DIAGNOSIS — E039 Hypothyroidism, unspecified: Secondary | ICD-10-CM | POA: Diagnosis not present

## 2014-05-16 ENCOUNTER — Encounter: Payer: Medicare Other | Admitting: Internal Medicine

## 2014-06-29 DIAGNOSIS — Z961 Presence of intraocular lens: Secondary | ICD-10-CM | POA: Diagnosis not present

## 2014-07-10 DIAGNOSIS — J3481 Nasal mucositis (ulcerative): Secondary | ICD-10-CM | POA: Diagnosis not present

## 2014-07-10 DIAGNOSIS — H04129 Dry eye syndrome of unspecified lacrimal gland: Secondary | ICD-10-CM | POA: Diagnosis not present

## 2014-07-10 DIAGNOSIS — H11439 Conjunctival hyperemia, unspecified eye: Secondary | ICD-10-CM | POA: Diagnosis not present

## 2014-07-10 DIAGNOSIS — H01029 Squamous blepharitis unspecified eye, unspecified eyelid: Secondary | ICD-10-CM | POA: Diagnosis not present

## 2014-07-10 DIAGNOSIS — H04559 Acquired stenosis of unspecified nasolacrimal duct: Secondary | ICD-10-CM | POA: Diagnosis not present

## 2014-07-10 DIAGNOSIS — H04229 Epiphora due to insufficient drainage, unspecified lacrimal gland: Secondary | ICD-10-CM | POA: Diagnosis not present

## 2014-07-16 ENCOUNTER — Other Ambulatory Visit: Payer: Self-pay | Admitting: Internal Medicine

## 2014-07-18 NOTE — Telephone Encounter (Signed)
Has lab work scheduled for 07/19/14 and upcoming CPE on 07/26/14.

## 2014-07-19 ENCOUNTER — Other Ambulatory Visit (INDEPENDENT_AMBULATORY_CARE_PROVIDER_SITE_OTHER): Payer: Medicare Other

## 2014-07-19 ENCOUNTER — Other Ambulatory Visit: Payer: Self-pay | Admitting: Internal Medicine

## 2014-07-19 DIAGNOSIS — E039 Hypothyroidism, unspecified: Secondary | ICD-10-CM | POA: Diagnosis not present

## 2014-07-19 DIAGNOSIS — R739 Hyperglycemia, unspecified: Secondary | ICD-10-CM

## 2014-07-19 DIAGNOSIS — I10 Essential (primary) hypertension: Secondary | ICD-10-CM

## 2014-07-19 DIAGNOSIS — E785 Hyperlipidemia, unspecified: Secondary | ICD-10-CM | POA: Diagnosis not present

## 2014-07-19 DIAGNOSIS — R7309 Other abnormal glucose: Secondary | ICD-10-CM | POA: Diagnosis not present

## 2014-07-19 LAB — BASIC METABOLIC PANEL WITH GFR
BUN: 20 mg/dL (ref 6–23)
CO2: 28 meq/L (ref 19–32)
Calcium: 9.6 mg/dL (ref 8.4–10.5)
Chloride: 106 meq/L (ref 96–112)
Creatinine, Ser: 0.9 mg/dL (ref 0.4–1.2)
GFR: 64.33 mL/min
Glucose, Bld: 80 mg/dL (ref 70–99)
Potassium: 4.5 meq/L (ref 3.5–5.1)
Sodium: 141 meq/L (ref 135–145)

## 2014-07-19 LAB — LIPID PANEL
Cholesterol: 163 mg/dL (ref 0–200)
HDL: 63 mg/dL
LDL Cholesterol: 74 mg/dL (ref 0–99)
NonHDL: 100
Total CHOL/HDL Ratio: 3
Triglycerides: 130 mg/dL (ref 0.0–149.0)
VLDL: 26 mg/dL (ref 0.0–40.0)

## 2014-07-19 LAB — TSH: TSH: 7.65 u[IU]/mL — AB (ref 0.35–4.50)

## 2014-07-19 LAB — HEPATIC FUNCTION PANEL
ALK PHOS: 59 U/L (ref 39–117)
ALT: 19 U/L (ref 0–35)
AST: 20 U/L (ref 0–37)
Albumin: 3.8 g/dL (ref 3.5–5.2)
BILIRUBIN TOTAL: 0.8 mg/dL (ref 0.2–1.2)
Bilirubin, Direct: 0 mg/dL (ref 0.0–0.3)
Total Protein: 6.8 g/dL (ref 6.0–8.3)

## 2014-07-26 ENCOUNTER — Encounter: Payer: Self-pay | Admitting: Internal Medicine

## 2014-07-26 ENCOUNTER — Telehealth: Payer: Self-pay | Admitting: Internal Medicine

## 2014-07-26 ENCOUNTER — Ambulatory Visit (INDEPENDENT_AMBULATORY_CARE_PROVIDER_SITE_OTHER): Payer: Medicare Other | Admitting: Internal Medicine

## 2014-07-26 VITALS — BP 156/58 | Temp 97.8°F | Ht 62.5 in | Wt 122.0 lb

## 2014-07-26 DIAGNOSIS — I779 Disorder of arteries and arterioles, unspecified: Secondary | ICD-10-CM

## 2014-07-26 DIAGNOSIS — R7309 Other abnormal glucose: Secondary | ICD-10-CM

## 2014-07-26 DIAGNOSIS — E785 Hyperlipidemia, unspecified: Secondary | ICD-10-CM | POA: Diagnosis not present

## 2014-07-26 DIAGNOSIS — E039 Hypothyroidism, unspecified: Secondary | ICD-10-CM | POA: Diagnosis not present

## 2014-07-26 DIAGNOSIS — Z0000 Encounter for general adult medical examination without abnormal findings: Secondary | ICD-10-CM

## 2014-07-26 DIAGNOSIS — Z8781 Personal history of (healed) traumatic fracture: Secondary | ICD-10-CM

## 2014-07-26 DIAGNOSIS — I739 Peripheral vascular disease, unspecified: Secondary | ICD-10-CM

## 2014-07-26 DIAGNOSIS — K2100 Gastro-esophageal reflux disease with esophagitis, without bleeding: Secondary | ICD-10-CM

## 2014-07-26 DIAGNOSIS — M810 Age-related osteoporosis without current pathological fracture: Secondary | ICD-10-CM

## 2014-07-26 DIAGNOSIS — Z23 Encounter for immunization: Secondary | ICD-10-CM | POA: Diagnosis not present

## 2014-07-26 DIAGNOSIS — J309 Allergic rhinitis, unspecified: Secondary | ICD-10-CM

## 2014-07-26 DIAGNOSIS — J300 Vasomotor rhinitis: Secondary | ICD-10-CM

## 2014-07-26 MED ORDER — OMEPRAZOLE 40 MG PO CPDR: 40.0000 mg | DELAYED_RELEASE_CAPSULE | Freq: Every day | ORAL | Status: DC

## 2014-07-26 MED ORDER — ROSUVASTATIN CALCIUM 5 MG PO TABS: 2.5000 mg | ORAL_TABLET | Freq: Every day | ORAL | Status: DC

## 2014-07-26 MED ORDER — LEVOTHYROXINE SODIUM 88 MCG PO TABS: ORAL_TABLET | ORAL | Status: DC

## 2014-07-26 MED ORDER — NEBIVOLOL HCL 2.5 MG PO TABS: 2.5000 mg | ORAL_TABLET | Freq: Every day | ORAL | Status: DC

## 2014-07-26 MED ORDER — AZELASTINE HCL 0.1 % NA SOLN: 2.0000 | Freq: Two times a day (BID) | NASAL | Status: DC

## 2014-07-26 NOTE — Progress Notes (Addendum)
Pre visit review using our clinic review tool, if applicable. No additional management support is needed unless otherwise documented below in the visit note.  Chief Complaint  Patient presents with  . Medicare Wellness  . Hypertension  . Hypothyroidism  . Gastrophageal Reflux    HPI: Patient comes in today for Preventive Medicare wellness visit . No major injuries, ed visits ,hospitalizations , new medications since last visit.  To do eye surgery  Stent clogged  .  Tear duct.  LIPIDS needs refill of Crestor and zetia no side effects noted  Thyroid; dr balan decrease the dose to 75 she still has a lot of 88 left at home asks about taking over  Management of this because of multiple doctors co-pays and cost. She takes her pill every morning when she first gets up and by the time she eats its 30-60 minutes. Is on the generic.  BP:  Hasn't checked her readings recently has been okay in the past.  GI :on omeprezole was on twice a day a while back and Dr. Olevia Perches had her decrease it to once a day and had episodic vomiting that was felt from severe reflux disease.  Diarrhea better  And taking once a day now . Asks if we can take over this medication also.  Runny nose has persistent runny nose that is clear the front of her nose gets sore she is taking Astelin but only taking it once a day.  Alternates Allegra and other antihistamine.  Uses melatonin for sleep intermittently seems to help.  Health Maintenance  Topic Date Due  . Zostavax  03/30/1991  . Influenza Vaccine  07/29/2014  . Tetanus/tdap  09/22/2021  . Colonoscopy  01/15/2022  . Pneumococcal Polysaccharide Vaccine Age 23 And Over  Completed   Health Maintenance Review LIFESTYLE:  Exercise:  Stretching in am 15 min at home  For every joint to street  Every day . Tobacco/ETS: no   Alcohol: 2-3 per week  Sugar beverages: sweet tea  1 per day  Sleep: better  Melatonin  2-3 x per week.  Drug use: no Bone density:    Colonoscopy:  NI    Hearing: good  Vision:  No limitations at present . Last eye check UTD  Safety:  Has smoke detector and wears seat belts.  No firearms. No excess sun exposure. Sees dentist regularly.  Falls:    Fell in stores  Slipped  But no injury.   No pain.   Advance directive :  Reviewed .  Memory: Felt to be good  , no concern from her or her family.   Depression: No anhedonia unusual crying or depressive symptoms see screen  Mood more related to health status and function   Nutrition: Eats well balanced diet; adequate calcium and vitamin D. No swallowing chewing problems.  Injury: no major injuries in the last six months.  Other healthcare providers:  Reviewed today .  Social:  One no pets    Preventive parameters: up-to-date  Reviewed   ADLS:   There are no problems or need for assistance  driving, feeding, obtaining food, dressing, toileting and bathing, managing money using phone. She is independent. Avoids driving at night   EXERCISE/ HABITS  Per week   No tobacco    etoh   ROS:  GEN/ HEENT: No fever, significant weight changes sweats headaches vision problems hearing changes, CV/ PULM; No chest pain shortness of breath cough, syncope,edema  change in exercise tolerance. GI /GU: No adominal  pain, vomiting, change in bowel habits. No blood in the stool. No significant GU symptoms. SKIN/HEME: ,no acute skin rashes suspicious lesions or bleeding. No lymphadenopathy, nodules, masses.  NEURO/ PSYCH:  No neurologic signs such as weakness numbness. No depression anxiety. IMM/ Allergy: No unusual infections.  Allergy .   REST of 12 system review negative except as per HPI   Past Medical History  Diagnosis Date  . GERD (gastroesophageal reflux disease)   . Hyperlipidemia   . Hypothyroidism   . Osteoarthritis   . Osteoporosis   . Episodic recurrent vertigo     MRI Head 2003  . Allergy   . Bladder polyps   . Subclavian steal syndrome     L carotid to L  subclavian bypass graft 1999; CTA 2013 revealed open graft  . Hyperplastic colon polyp   . Esophageal spasm   . History of hepatitis     unknown type  . Asymptomatic carotid artery stenosis     R ICA 40% stenosis on CTA 12/2011.  [name] Anemia   . Cataract     BILATERAL-REMOVED  . Elevated blood pressure 03/25/2011    Bp readings borderline today has hx of elvation  In office and ok at home   Not checke recently   She gfeels was elevated from anxiety    . Diverticulosis   . Intestinal metaplasia of gastric mucosa   . Hepatic hemangioma    Past Surgical History  Procedure Laterality Date  . Appendectomy    . Cholecystectomy    . Carotid-subclavian bypass graft Left 1999    for [place] steal syndrome  . Rotator cuff repair Left   . Elbow surgery Left   . Cystectomy Left     hand  . Tubal ligation    . Tonsillectomy    . Colonoscopy      Family History  Problem Relation Age of Onset  . Hypertension Mother   . Heart disease Father   . Stroke Mother   . Colon cancer Neg Hx     History   Social History  . Marital Status: Widowed    Spouse Name: N/A    Number of Children: 4  . Years of Education: N/A   Occupational History  . retired    Social History Main Topics  . Smoking status: Former Smoker    Quit date: 01/05/1991  . Smokeless tobacco: Never Used  . Alcohol Use: 2.4 oz/week    4 Glasses of wine per week     Comment: socially  . Drug Use: No  . Sexual Activity: None   Other Topics Concern  . None   Social History Narrative   Widowed   HH of 1    No pets   Former smoker   Exercises regularly    Outpatient Encounter Prescriptions as of 07/26/2014  Medication Sig  . aspirin 325 MG tablet Take 325 mg by mouth daily.    [name] azelastine (ASTELIN) 0.1 % nasal spray Place 2 sprays into both nostrils 2 (two) times daily. Use in each nostril as directed  . calcium-vitamin D (OSCAL WITH D) 500-200 MG-UNIT per tablet Take 1 tablet by mouth daily.   . Cholecalciferol  (VITAMIN D3) 1000 UNITS CAPS Take 1 capsule by mouth daily.   [name] ezetimibe (ZETIA) 10 MG tablet Take 10 mg by mouth daily.  . fexofenadine (ALLEGRA) 180 MG tablet Take 180 mg by mouth daily.  . fish oil-omega-3 fatty acids 1000 MG capsule Take 1 g  by mouth daily.   [name] glucosamine-chondroitin 500-400 MG tablet Take 1 tablet by mouth daily.   [name] ibuprofen (ADVIL,MOTRIN) 200 MG tablet Take 200 mg by mouth every 6 (six) hours as needed. Patient used this medication for a headache.  . Melatonin 3 MG TABS Take 1 tablet by mouth every other day.   . multivitamin (THERAGRAN) per tablet Take 1 tablet by mouth daily.    . nebivolol (BYSTOLIC) 2.5 MG tablet Take 1 tablet (2.5 mg total) by mouth daily.  . nitroGLYCERIN (NITROSTAT) 0.4 MG SL tablet Place 0.4 mg under the tongue every 5 (five) minutes as needed for chest pain.  [name] omeprazole (PRILOSEC) 40 MG capsule Take 1 capsule (40 mg total) by mouth daily.  . rosuvastatin (CRESTOR) 5 MG tablet Take 0.5 tablets (2.5 mg total) by mouth daily.  [name] saccharomyces boulardii (FLORASTOR) 250 MG capsule Take 1 capsule (250 mg total) by mouth 2 (two) times daily.  . [DISCONTINUED] azelastine (ASTELIN) 137 MCG/SPRAY nasal spray Place 2 sprays into both nostrils 2 (two) times daily. Use in each nostril as directed  . [DISCONTINUED] levothyroxine (SYNTHROID, LEVOTHROID) 75 MCG tablet Take 75 mcg by mouth daily before breakfast.  . [DISCONTINUED] nebivolol (BYSTOLIC) 2.5 MG tablet Take 0.5 tablets (1.25 mg total) by mouth daily. Resume bystolic on 4/4 when you are able to eat and drink more normally.  . [DISCONTINUED] omeprazole (PRILOSEC) 40 MG capsule Take 1 capsule (40 mg total) by mouth daily.  . [DISCONTINUED] rosuvastatin (CRESTOR) 5 MG tablet Take 2.5 mg by mouth daily.  [name] levothyroxine (SYNTHROID, LEVOTHROID) 88 MCG tablet TAKE 1 TABLET DAILY  . [DISCONTINUED] levothyroxine (SYNTHROID, LEVOTHROID) 88 MCG tablet TAKE 1 TABLET DAILY    EXAM:  BP 156/58  Temp(Src)  97.8 F (36.6 C) (Oral)  Ht 5' 2.5" (1.588 m)  Wt 122 lb (55.339 kg)  BMI 21.94 kg/m2  Body mass index is 21.94 kg/(m^2).  Physical Exam: Vital signs reviewed [id number] is a well-developed well-nourished alert cooperative   who appears stated age in no acute distress.  HEENT: normocephalic atraumatic , Eyes: PERRL EOM's full, conjunctiva clear, Nares: paten,t no deformity clear discharge no facial tenderness., Ears: no deformity EAC's clear TMs with normal landmarks. Mouth: clear OP, no lesions, edema.  Moist mucous membranes. Dentition in adequate repair. NECK: supple without masses, thyromegaly  CHEST/PULM:  Clear to auscultation and percussion breath sounds equal no wheeze , rales or rhonchi. No chest wall deformities or tenderness. Breasts no nodules or discharge. CV: PMI is nondisplaced, S1 S2 no gallops, murmurs, rubs. Peripheral pulses are present without delay.No JVD .  ABDOMEN: Bowel sounds normal nontender  No guard or rebound, no hepato splenomegal no CVA tenderness. Extremtities:  No clubbing cyanosis or edema, arthritic changes in hands PIP and DIPs no acute joint swelling or redness no focal atrophy NEURO:  Oriented x3, cranial nerves 3-12 appear to be intact, no obvious focal weakness,gait within normal limits no abnormal reflexes or asymmetrical SKIN: No acute rashes normal turgor, color, no bruising or petechiae. PSYCH: Oriented, good eye contact, no obvious depression anxiety, cognition and judgment appear normal. LN: no cervical axillary inguinal adenopathy No noted deficits in memory, attention, and speech.   Lab Results  Component Value Date   WBC 5.6 03/31/2014   HGB 12.5 03/31/2014   HCT 36.8 03/31/2014   PLT 164 03/31/2014   GLUCOSE 80 07/19/2014   CHOL 163 07/19/2014   TRIG 130.0 07/19/2014   HDL 63.00 07/19/2014   LDLCALC 74  07/19/2014   ALT 19 07/19/2014   AST 20 07/19/2014   NA 141 07/19/2014   K 4.5 07/19/2014   CL 106 07/19/2014   CREATININE 0.9 07/19/2014   BUN 20  07/19/2014   CO2 28 07/19/2014   TSH 7.65* 07/19/2014   HGBA1C 5.5 05/24/2013    ASSESSMENT AND PLAN:  Discussed the following assessment and plan:  Medicare annual wellness visit, subsequent - Uncertain if it had a bone density we'll check on this and get back with [place]. prevna today  HYPERLIPIDEMIA  Unspecified hypothyroidism - tsh up today report  go back up to 75 ( has   a lot of pills at home ) recheck tsh in 3 months and rov  Other abnormal glucose - no diabetes   Carotid artery disease without cerebral infarction  Osteoporosis, unspecified - may be due for dex was on fosamax for a nubmer of years.   Need for prophylactic vaccination against Streptococcus pneumoniae (pneumococcus)  Vasomotor rhinitis - try ns bid   Gastroesophageal reflux disease with esophagitis - a lot better  now on qd  has no gi fu for now stay on qd   hesitant to wean because she had such severe sx in the past . will flag dr Olevia Perches refill at this time  Patient Care Team: Amy Medin, MD as PCP - General Amy Schools, MD (Obstetrics and Gynecology) Amy Pole, MD (Orthopedic Surgery) Amy Pi, MD as Attending Physician (Endocrinology) Amy Dragon, MD as Consulting Physician (Gastroenterology)  Patient Instructions  Thyroid test is off . Go back to the 88 mcg dose every day and plan recheck tsh in 2-3 months . Get [place] a copy of latest bone density  consideration for there treatment such as priolia .  Stay on the omeprazole for now and will ask dr Olevia Perches about long  term plan .  Take the atrovent twice a day  To see if helps nose .  Check bp readings to make sure they are in the normal range .somewhat up today.       [name]. Amy Fischer M.D.    Dr Olevia Perches advise staying on some acid suppression because of the severity of her reflux documented and prev  sx  Although could try ranitidine  Hs  If gets no sx   Or relapse . At this time will  Have her remain on ppi.  WP

## 2014-07-26 NOTE — Telephone Encounter (Signed)
Pt called and said she has never had a bone density

## 2014-07-26 NOTE — Telephone Encounter (Signed)
Okay to order?

## 2014-07-26 NOTE — Patient Instructions (Signed)
Thyroid test is off . Go back to the 88 mcg dose every day and plan recheck tsh in 2-3 months . Get [place] a copy of latest bone density  consideration for there treatment such as priolia .  Stay on the omeprazole for now and will ask dr Olevia Perches about long  term plan .  Take the atrovent twice a day  To see if helps nose .  Check bp readings to make sure they are in the normal range .somewhat up today.

## 2014-07-26 NOTE — Telephone Encounter (Signed)
Please order a bone density  Dx hx of fracture osteoporosis

## 2014-07-27 NOTE — Telephone Encounter (Signed)
Please schedule patient for a bone density at [facility].  Order placed.

## 2014-07-29 HISTORY — PX: TEAR DUCT PROBING: SHX793

## 2014-07-31 ENCOUNTER — Other Ambulatory Visit: Payer: Self-pay | Admitting: Ophthalmology

## 2014-07-31 DIAGNOSIS — H04229 Epiphora due to insufficient drainage, unspecified lacrimal gland: Secondary | ICD-10-CM | POA: Diagnosis not present

## 2014-07-31 DIAGNOSIS — H04559 Acquired stenosis of unspecified nasolacrimal duct: Secondary | ICD-10-CM | POA: Diagnosis not present

## 2014-07-31 DIAGNOSIS — H04419 Chronic dacryocystitis of unspecified lacrimal passage: Secondary | ICD-10-CM | POA: Diagnosis not present

## 2014-08-02 NOTE — Telephone Encounter (Signed)
lmovm to call and schedule a bone density

## 2014-08-08 ENCOUNTER — Ambulatory Visit (INDEPENDENT_AMBULATORY_CARE_PROVIDER_SITE_OTHER): Payer: Medicare Other | Admitting: Physician Assistant

## 2014-08-08 ENCOUNTER — Encounter: Payer: Self-pay | Admitting: Physician Assistant

## 2014-08-08 VITALS — BP 128/82 | HR 97 | Temp 98.0°F | Resp 18 | Wt 121.0 lb

## 2014-08-08 DIAGNOSIS — J0100 Acute maxillary sinusitis, unspecified: Secondary | ICD-10-CM | POA: Diagnosis not present

## 2014-08-08 MED ORDER — DOXYCYCLINE HYCLATE 100 MG PO TABS: 100.0000 mg | ORAL_TABLET | Freq: Two times a day (BID) | ORAL | Status: DC

## 2014-08-08 NOTE — Patient Instructions (Addendum)
Doxycycline twice daily for 10 days. Take with food to prevent nausea and vomiting.  Plain Over the Counter Mucinex (NOT Mucinex D) for thick secretions  Force NON dairy fluids, drinking plenty of water is best.    Over the Counter Flonase OR Nasacort AQ 1 spray in each nostril twice a day as needed. Use the "crossover" technique into opposite nostril spraying toward opposite ear @ 45 degree angle, not straight up into nostril.   Plain Over the Counter Allegra (NOT D )  160 daily , OR Loratidine 10 mg , OR Zyrtec 10 mg @ bedtime  as needed for itchy eyes & sneezing.  Saline Irrigation and Saline Sprays can also help reduce symptoms.  If emergency symptoms discussed during visit developed, seek medical attention immediately.  Followup as needed, or for worsening or persistent symptoms despite treatment.     Sinusitis Sinusitis is redness, soreness, and puffiness (inflammation) of the air pockets in the bones of your face (sinuses). The redness, soreness, and puffiness can cause air and mucus to get trapped in your sinuses. This can allow germs to grow and cause an infection.  HOME CARE   Drink enough fluids to keep your pee (urine) clear or pale yellow.  Use a humidifier in your home.  Run a hot shower to create steam in the bathroom. Sit in the bathroom with the door closed. Breathe in the steam 3-4 times a day.  Put a warm, moist washcloth on your face 3-4 times a day, or as told by your doctor.  Use salt water sprays (saline sprays) to wet the thick fluid in your nose. This can help the sinuses drain.  Only take medicine as told by your doctor. GET HELP RIGHT AWAY IF:   Your pain gets worse.  You have very bad headaches.  You are sick to your stomach (nauseous).  You throw up (vomit).  You are very sleepy (drowsy) all the time.  Your face is puffy (swollen).  Your vision changes.  You have a stiff neck.  You have trouble breathing. MAKE SURE YOU:   Understand  these instructions.  Will watch your condition.  Will get help right away if you are not doing well or get worse. Document Released: 06/02/2008 Document Revised: 09/08/2012 Document Reviewed: 07/20/2012 [facility] Patient Information 2015 [place], [place]. This information is not intended to replace advice given to you by your health care provider. Make sure you discuss any questions you have with your health care provider.

## 2014-08-08 NOTE — Progress Notes (Signed)
Subjective:    Patient ID: Amy Fischer, female    DOB: October 07, 1929, 80 y.o.   MRN: [id number]  Sinusitis This is a new problem. The current episode started 1 to 4 weeks ago. The problem has been gradually worsening since onset. There has been no fever. Her pain is at a severity of 5/10. The pain is moderate. Associated symptoms include congestion, coughing, headaches, a hoarse voice and sinus pressure. Pertinent negatives include no chills, diaphoresis, ear pain, neck pain, shortness of breath, sneezing, sore throat (had one, but it resolved.) or swollen glands. Treatments tried: mucinex DM. The treatment provided mild relief.      Review of Systems  Constitutional: Negative for fever, chills and diaphoresis.  HENT: Positive for congestion, hoarse voice, postnasal drip and sinus pressure. Negative for ear pain, sneezing and sore throat (had one, but it resolved.).   Respiratory: Positive for cough. Negative for shortness of breath.   Cardiovascular: Negative for chest pain.  Gastrointestinal: Negative for nausea, vomiting and diarrhea.  Musculoskeletal: Negative for neck pain.  Neurological: Positive for headaches. Negative for syncope.  All other systems reviewed and are negative.    Past Medical History  Diagnosis Date  . GERD (gastroesophageal reflux disease)   . Hyperlipidemia   . Hypothyroidism   . Osteoarthritis   . Osteoporosis   . Episodic recurrent vertigo     MRI Head 2003  . Allergy   . Bladder polyps   . Subclavian steal syndrome     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; CTA 2013 revealed open graft  . Hyperplastic colon polyp   . Esophageal spasm   . History of hepatitis     unknown type  . Asymptomatic carotid artery stenosis     R ICA 40% stenosis on CTA 12/2011.  [name] Anemia   . Cataract     BILATERAL-REMOVED  . Elevated blood pressure 03/25/2011    Bp readings borderline today has hx of elvation  In office and ok at home   Not checke recently   She  gfeels was elevated from anxiety    . Diverticulosis   . Intestinal metaplasia of gastric mucosa   . Hepatic hemangioma     History   Social History  . Marital Status: Widowed    Spouse Name: N/A    Number of Children: 4  . Years of Education: N/A   Occupational History  . retired    Social History Main Topics  . Smoking status: Former Smoker    Quit date: 01/05/1991  . Smokeless tobacco: Never Used  . Alcohol Use: 2.4 oz/week    4 Glasses of wine per week     Comment: socially  . Drug Use: No  . Sexual Activity: Not on file   Other Topics Concern  . Not on file   Social History Narrative   Widowed   HH of 1    No pets   Former smoker   Exercises regularly    Past Surgical History  Procedure Laterality Date  . Appendectomy    . Cholecystectomy    . Carotid-subclavian bypass graft Left 1999    for [place] steal syndrome  . Rotator cuff repair Left   . Elbow surgery Left   . Cystectomy Left     hand  . Tubal ligation    . Tonsillectomy    . Colonoscopy      Family History  Problem Relation Age of Onset  . Hypertension Mother   .  Heart disease Father   . Stroke Mother   . Colon cancer Neg Hx     Allergies  Allergen Reactions  . Augmentin [Amoxicillin-Pot Clavulanate] Diarrhea    Not allergic  2014  . Codeine Nausea And Vomiting  . Risedronate Sodium     unknown  . Statins     Muscles hurt     Current Outpatient Prescriptions on File Prior to Visit  Medication Sig Dispense Refill  . aspirin 325 MG tablet Take 325 mg by mouth daily.        [name] azelastine (ASTELIN) 0.1 % nasal spray Place 2 sprays into both nostrils 2 (two) times daily. Use in each nostril as directed  30 mL  3  . calcium-vitamin D (OSCAL WITH D) 500-200 MG-UNIT per tablet Take 1 tablet by mouth daily.       . Cholecalciferol (VITAMIN D3) 1000 UNITS CAPS Take 1 capsule by mouth daily.       [name] ezetimibe (ZETIA) 10 MG tablet Take 10 mg by mouth daily.      . fexofenadine (ALLEGRA) 180  MG tablet Take 180 mg by mouth daily.      . fish oil-omega-3 fatty acids 1000 MG capsule Take 1 g by mouth daily.       [name] glucosamine-chondroitin 500-400 MG tablet Take 1 tablet by mouth daily.       [name] ibuprofen (ADVIL,MOTRIN) 200 MG tablet Take 200 mg by mouth every 6 (six) hours as needed. Patient used this medication for a headache.      . levothyroxine (SYNTHROID, LEVOTHROID) 88 MCG tablet TAKE 1 TABLET DAILY  90 tablet  0  . Melatonin 3 MG TABS Take 1 tablet by mouth every other day.       . multivitamin (THERAGRAN) per tablet Take 1 tablet by mouth daily.        . nebivolol (BYSTOLIC) 2.5 MG tablet Take 1 tablet (2.5 mg total) by mouth daily.  90 tablet  3  . nitroGLYCERIN (NITROSTAT) 0.4 MG SL tablet Place 0.4 mg under the tongue every 5 (five) minutes as needed for chest pain.      [name] omeprazole (PRILOSEC) 40 MG capsule Take 1 capsule (40 mg total) by mouth daily.  90 capsule  1  . rosuvastatin (CRESTOR) 5 MG tablet Take 0.5 tablets (2.5 mg total) by mouth daily.  90 tablet  3  . saccharomyces boulardii (FLORASTOR) 250 MG capsule Take 1 capsule (250 mg total) by mouth 2 (two) times daily.  60 capsule  2   No current facility-administered medications on file prior to visit.    EXAM: BP 128/82  Pulse 97  Temp(Src) 98 F (36.7 C) (Oral)  Resp 18  Wt 121 lb (54.885 kg)  SpO2 97%     Objective:   Physical Exam  Nursing note and vitals reviewed. Constitutional: She is oriented to person, place, and time. She appears well-developed and well-nourished. No distress.  HENT:  Head: Normocephalic and atraumatic.  Right Ear: External ear normal.  Left Ear: External ear normal.  Nose: Nose normal.  Mouth/Throat: No oropharyngeal exudate.  Oropharynx is slightly erythematous, no exudate. Bilateral TMs normal. Bilateral frontal sinuses non-TTP. Bilat max sinus ttp.  Eyes: Conjunctivae and EOM are normal. Pupils are equal, round, and reactive to light.  Neck: Normal range of motion.  Neck supple.  Cardiovascular: Normal rate, regular rhythm and intact distal pulses.   Pulmonary/Chest: Effort normal and breath sounds normal. No stridor. No respiratory distress. She  has no wheezes. She has no rales. She exhibits no tenderness.  Lymphadenopathy:    She has no cervical adenopathy.  Neurological: She is alert and oriented to person, place, and time.  Skin: Skin is warm and dry. No rash noted. She is not diaphoretic. No erythema. No pallor.  Psychiatric: She has a normal mood and affect. Her behavior is normal. Judgment and thought content normal.     Lab Results  Component Value Date   WBC 5.6 03/31/2014   HGB 12.5 03/31/2014   HCT 36.8 03/31/2014   PLT 164 03/31/2014   GLUCOSE 80 07/19/2014   CHOL 163 07/19/2014   TRIG 130.0 07/19/2014   HDL 63.00 07/19/2014   LDLCALC 74 07/19/2014   ALT 19 07/19/2014   AST 20 07/19/2014   NA 141 07/19/2014   K 4.5 07/19/2014   CL 106 07/19/2014   CREATININE 0.9 07/19/2014   BUN 20 07/19/2014   CO2 28 07/19/2014   TSH 7.65* 07/19/2014   HGBA1C 5.5 05/24/2013        Assessment & Plan:  Abena was seen today for cough.  Diagnoses and associated orders for this visit:  Acute maxillary sinusitis, recurrence not specified Comments: Treat due to length of symptoms. Doxycycline, add OTC mucinex, nasal steroid, antihistamine, rest, push fluids. - doxycycline (VIBRA-TABS) 100 MG tablet; Take 1 tablet (100 mg total) by mouth 2 (two) times daily.    Return precautions provided, and patient handout on sinusitis.  Plan to follow up as needed, or for worsening or persistent symptoms despite treatment.  Patient Instructions  Doxycycline twice daily for 10 days. Take with food to prevent nausea and vomiting.  Plain Over the Counter Mucinex (NOT Mucinex D) for thick secretions  Force NON dairy fluids, drinking plenty of water is best.    Over the Counter Flonase OR Nasacort AQ 1 spray in each nostril twice a day as needed. Use the "crossover"  technique into opposite nostril spraying toward opposite ear @ 45 degree angle, not straight up into nostril.   Plain Over the Counter Allegra (NOT D )  160 daily , OR Loratidine 10 mg , OR Zyrtec 10 mg @ bedtime  as needed for itchy eyes & sneezing.  Saline Irrigation and Saline Sprays can also help reduce symptoms.  If emergency symptoms discussed during visit developed, seek medical attention immediately.  Followup as needed, or for worsening or persistent symptoms despite treatment.

## 2014-08-09 ENCOUNTER — Ambulatory Visit (INDEPENDENT_AMBULATORY_CARE_PROVIDER_SITE_OTHER)
Admission: RE | Admit: 2014-08-09 | Discharge: 2014-08-09 | Disposition: A | Discharge status: Home / Self Care | Payer: Medicare Other | Source: Ambulatory Visit | Attending: Internal Medicine | Admitting: Internal Medicine

## 2014-08-09 DIAGNOSIS — Z8781 Personal history of (healed) traumatic fracture: Secondary | ICD-10-CM | POA: Diagnosis not present

## 2014-08-09 DIAGNOSIS — M810 Age-related osteoporosis without current pathological fracture: Secondary | ICD-10-CM | POA: Diagnosis not present

## 2014-08-09 NOTE — Telephone Encounter (Signed)
Pt has been scheduled.  °

## 2014-08-14 DIAGNOSIS — H04559 Acquired stenosis of unspecified nasolacrimal duct: Secondary | ICD-10-CM | POA: Diagnosis not present

## 2014-08-14 DIAGNOSIS — H04229 Epiphora due to insufficient drainage, unspecified lacrimal gland: Secondary | ICD-10-CM | POA: Diagnosis not present

## 2014-09-17 DIAGNOSIS — Z23 Encounter for immunization: Secondary | ICD-10-CM | POA: Diagnosis not present

## 2014-09-18 DIAGNOSIS — H02539 Eyelid retraction unspecified eye, unspecified lid: Secondary | ICD-10-CM | POA: Diagnosis not present

## 2014-09-18 DIAGNOSIS — H04219 Epiphora due to excess lacrimation, unspecified lacrimal gland: Secondary | ICD-10-CM | POA: Diagnosis not present

## 2014-09-18 DIAGNOSIS — J3481 Nasal mucositis (ulcerative): Secondary | ICD-10-CM | POA: Diagnosis not present

## 2014-09-28 ENCOUNTER — Other Ambulatory Visit: Payer: Self-pay | Admitting: Internal Medicine

## 2014-09-28 NOTE — Telephone Encounter (Signed)
Sent to the pharmacy by e-scribe. 

## 2014-10-17 ENCOUNTER — Other Ambulatory Visit (INDEPENDENT_AMBULATORY_CARE_PROVIDER_SITE_OTHER): Payer: Medicare Other

## 2014-10-17 DIAGNOSIS — R946 Abnormal results of thyroid function studies: Secondary | ICD-10-CM

## 2014-10-17 LAB — TSH: TSH: 4.5 u[IU]/mL (ref 0.35–4.50)

## 2014-10-24 ENCOUNTER — Encounter: Payer: Self-pay | Admitting: Internal Medicine

## 2014-10-24 ENCOUNTER — Ambulatory Visit (INDEPENDENT_AMBULATORY_CARE_PROVIDER_SITE_OTHER): Payer: Medicare Other | Admitting: Internal Medicine

## 2014-10-24 VITALS — BP 160/74 | Temp 97.5°F | Ht 62.5 in | Wt 123.1 lb

## 2014-10-24 DIAGNOSIS — I10 Essential (primary) hypertension: Secondary | ICD-10-CM

## 2014-10-24 DIAGNOSIS — E039 Hypothyroidism, unspecified: Secondary | ICD-10-CM

## 2014-10-24 DIAGNOSIS — M810 Age-related osteoporosis without current pathological fracture: Secondary | ICD-10-CM

## 2014-10-24 NOTE — Progress Notes (Signed)
Pre visit review using our clinic review tool, if applicable. No additional management support is needed unless otherwise documented below in the visit note.  Chief Complaint  Patient presents with  . Follow-up    HPI: Amy Fischer  Comes in today for follow-up of thyroid bone density in blood pressure. Since her last visit she did have eye surgery stents put in for tearing apparently not working at this time  bp 119  To 140. No significant change no falling walking regularly.  Thyroid medicine was increased had lab test no obvious change in symptoms  History of osteoporosis had bone density was on Fosamax for a while on the back past but had GI side effects esophageal. Has a history of a fracture 2 years ago from fall. ROS: See pertinent positives and negatives per HPI. No current chest pain shortness of breath other injury. Has osteoarthritis always has a runny nose on a certainwhatelsetodo.  Past Medical History  Diagnosis Date  . GERD (gastroesophageal reflux disease)   . Hyperlipidemia   . Hypothyroidism   . Osteoarthritis   . Osteoporosis   . Episodic recurrent vertigo     MRI Head 2003  . Allergy   . Bladder polyps   . Subclavian steal syndrome     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; CTA 2013 revealed open graft  . Hyperplastic colon polyp   . Esophageal spasm   . History of hepatitis     unknown type  . Asymptomatic carotid artery stenosis     R ICA 40% stenosis on CTA 12/2011.  [name] Anemia   . Cataract     BILATERAL-REMOVED  . Elevated blood pressure 03/25/2011    Bp readings borderline today has hx of elvation  In office and ok at home   Not checke recently   She gfeels was elevated from anxiety    . Diverticulosis   . Intestinal metaplasia of gastric mucosa   . Hepatic hemangioma     Family History  Problem Relation Age of Onset  . Hypertension Mother   . Heart disease Father   . Stroke Mother   . Colon cancer Neg Hx     History   Social  History  . Marital Status: Widowed    Spouse Name: N/A    Number of Children: 4  . Years of Education: N/A   Occupational History  . retired    Social History Main Topics  . Smoking status: Former Smoker    Quit date: 01/05/1991  . Smokeless tobacco: Never Used  . Alcohol Use: 2.4 oz/week    4 Glasses of wine per week     Comment: socially  . Drug Use: No  . Sexual Activity: None   Other Topics Concern  . None   Social History Narrative   Widowed   HH of 1    No pets   Former smoker   Exercises regularly    Outpatient Encounter Prescriptions as of 10/24/2014  Medication Sig  . aspirin 325 MG tablet Take 325 mg by mouth daily.    [name] azelastine (ASTELIN) 0.1 % nasal spray Place 2 sprays into both nostrils 2 (two) times daily. Use in each nostril as directed  . calcium-vitamin D (OSCAL WITH D) 500-200 MG-UNIT per tablet Take 1 tablet by mouth daily.   . Cholecalciferol (VITAMIN D3) 1000 UNITS CAPS Take 1 capsule by mouth daily.   [name] ezetimibe (ZETIA) 10 MG tablet Take 10 mg by mouth  daily.  . fexofenadine (ALLEGRA) 180 MG tablet Take 180 mg by mouth daily.  . fish oil-omega-3 fatty acids 1000 MG capsule Take 1 g by mouth daily.   [name] glucosamine-chondroitin 500-400 MG tablet Take 1 tablet by mouth daily.   [name] ibuprofen (ADVIL,MOTRIN) 200 MG tablet Take 200 mg by mouth every 6 (six) hours as needed. Patient used this medication for a headache.  . levothyroxine (SYNTHROID, LEVOTHROID) 88 MCG tablet TAKE 1 TABLET DAILY  . levothyroxine (SYNTHROID, LEVOTHROID) 88 MCG tablet TAKE 1 TABLET DAILY  . Melatonin 3 MG TABS Take 1 tablet by mouth every other day.   . multivitamin (THERAGRAN) per tablet Take 1 tablet by mouth daily.    . nebivolol (BYSTOLIC) 2.5 MG tablet Take 1 tablet (2.5 mg total) by mouth daily.  [name] omeprazole (PRILOSEC) 40 MG capsule Take 1 capsule (40 mg total) by mouth daily.  . rosuvastatin (CRESTOR) 5 MG tablet Take 0.5 tablets (2.5 mg total) by mouth daily.  .  [DISCONTINUED] saccharomyces boulardii (FLORASTOR) 250 MG capsule Take 1 capsule (250 mg total) by mouth 2 (two) times daily.  . nitroGLYCERIN (NITROSTAT) 0.4 MG SL tablet Place 0.4 mg under the tongue every 5 (five) minutes as needed for chest pain.  . [DISCONTINUED] doxycycline (VIBRA-TABS) 100 MG tablet Take 1 tablet (100 mg total) by mouth 2 (two) times daily.    EXAM:  BP 160/74  Temp(Src) 97.5 F (36.4 C) (Oral)  Ht 5' 2.5" (1.588 m)  Wt 123 lb 1.6 oz (55.838 kg)  BMI 22.14 kg/m2  Body mass index is 22.14 kg/(m^2).  GENERAL: vitals reviewed and listed above, alert, oriented, appears well hydrated and in no acute distress HEENT: atraumatic, conjunctiva  clear, no obvious abnormalities on inspection of external nose and ears MS: moves all extremities   arthritis changes some deformity hands  PSYCH: pleasant and cooperative, no obvious depression or anxiety cognition intact.  Lab Results  Component Value Date   WBC 5.6 03/31/2014   HGB 12.5 03/31/2014   HCT 36.8 03/31/2014   PLT 164 03/31/2014   GLUCOSE 80 07/19/2014   CHOL 163 07/19/2014   TRIG 130.0 07/19/2014   HDL 63.00 07/19/2014   LDLCALC 74 07/19/2014   ALT 19 07/19/2014   AST 20 07/19/2014   NA 141 07/19/2014   K 4.5 07/19/2014   CL 106 07/19/2014   CREATININE 0.9 07/19/2014   BUN 20 07/19/2014   CO2 28 07/19/2014   TSH 4.50 10/17/2014   HGBA1C 5.5 05/24/2013    ASSESSMENT AND PLAN:  Discussed the following assessment and plan:  Hypothyroidism, unspecified hypothyroidism type - better range  conitnue check tsh in 4-6 months   Osteoporosis - dexa hip left -2.5 suggest prolia she will look at info and contact us about proceeding  Hypertension, isolated systolic - ok at home up today  has been good Had  Gi problems   With the fosamax    For a few years.   -Patient advised to return or notify health care team  if symptoms worsen ,persist or new concerns arise. Total visit 4mins > 50% spent counseling and coordinating care  options osteoporosis .[name] Don't have a handout from the company she will ask her daughter in law who is a pharmacist also have her contact us so we can move forward with using probably and she agrees.     Patient Instructions  Thyroid test is ok now  contiunue same dose of medication. Check tsh in 6 months or  thereabouts.  You have osteop[rosis on the bone density  . Because you had Gi problems with the fosamax.  Suggest considering PROLIA   Injection every 6 months .  Contact us if  You  Want to begin this so we can get the medicine for your.   continue to monitor blood pressure to make sure below 150   Denosumab injection What is this medicine? DENOSUMAB (den oh sue mab) slows bone breakdown. Prolia is used to treat osteoporosis in women after menopause and in men. Delton See is used to prevent bone fractures and other bone problems caused by cancer bone metastases. Delton See is also used to treat giant cell tumor of the bone. This medicine may be used for other purposes; ask your health care provider or pharmacist if you have questions. COMMON BRAND NAME(S): Prolia, XGEVA What should I tell my health care provider before I take this medicine? They need to know if you have any of these conditions: -dental disease -eczema -infection or history of infections -kidney disease or on dialysis -low blood calcium or vitamin D -malabsorption syndrome -scheduled to have surgery or tooth extraction -taking medicine that contains denosumab -thyroid or parathyroid disease -an unusual reaction to denosumab, other medicines, foods, dyes, or preservatives -pregnant or trying to get pregnant -breast-feeding How should I use this medicine? This medicine is for injection under the skin. It is given by a health care professional in a hospital or clinic setting. If you are getting Prolia, a special MedGuide will be given to you by the pharmacist with each prescription and refill. Be sure to read this  information carefully each time. For Prolia, talk to your pediatrician regarding the use of this medicine in children. Special care may be needed. For Delton See, talk to your pediatrician regarding the use of this medicine in children. While this drug may be prescribed for children as young as 13 years for selected conditions, precautions do apply. Overdosage: If you think you've taken too much of this medicine contact a poison control center or emergency room at once. Overdosage: If you think you have taken too much of this medicine contact a poison control center or emergency room at once. NOTE: This medicine is only for you. Do not share this medicine with others. What if I miss a dose? It is important not to miss your dose. Call your doctor or health care professional if you are unable to keep an appointment. What may interact with this medicine? Do not take this medicine with any of the following medications: -other medicines containing denosumab This medicine may also interact with the following medications: -medicines that suppress the immune system -medicines that treat cancer -steroid medicines like prednisone or cortisone This list may not describe all possible interactions. Give your health care provider a list of all the medicines, herbs, non-prescription drugs, or dietary supplements you use. Also tell them if you smoke, drink alcohol, or use illegal drugs. Some items may interact with your medicine. What should I watch for while using this medicine? Visit your doctor or health care professional for regular checks on your progress. Your doctor or health care professional may order blood tests and other tests to see how you are doing. Call your doctor or health care professional if you get a cold or other infection while receiving this medicine. Do not treat yourself. This medicine may decrease your body's ability to fight infection. You should make sure you get enough calcium and vitamin D  while you are taking  this medicine, unless your doctor tells you not to. Discuss the foods you eat and the vitamins you take with your health care professional. See your dentist regularly. Brush and floss your teeth as directed. Before you have any dental work done, tell your dentist you are receiving this medicine. Do not become pregnant while taking this medicine or for 5 months after stopping it. Women should inform their doctor if they wish to become pregnant or think they might be pregnant. There is a potential for serious side effects to an unborn child. Talk to your health care professional or pharmacist for more information. What side effects may I notice from receiving this medicine? Side effects that you should report to your doctor or health care professional as soon as possible: -allergic reactions like skin rash, itching or hives, swelling of the face, lips, or tongue -breathing problems -chest pain -fast, irregular heartbeat -feeling faint or lightheaded, falls -fever, chills, or any other sign of infection -muscle spasms, tightening, or twitches -numbness or tingling -skin blisters or bumps, or is dry, peels, or red -slow healing or unexplained pain in the mouth or jaw -unusual bleeding or bruising Side effects that usually do not require medical attention (Report these to your doctor or health care professional if they continue or are bothersome.): -muscle pain -stomach upset, gas This list may not describe all possible side effects. Call your doctor for medical advice about side effects. You may report side effects to FDA at 1-800-FDA-1088. Where should I keep my medicine? This medicine is only given in a clinic, doctor's office, or other health care setting and will not be stored at home. NOTE: This sheet is a summary. It may not cover all possible information. If you have questions about this medicine, talk to your doctor, pharmacist, or health care provider.  2015,  Elsevier/Gold Standard. (2012-06-14 12:37:47)      [name]. Nelma Phagan M.D.

## 2014-10-24 NOTE — Patient Instructions (Addendum)
Thyroid test is ok now  contiunue same dose of medication. Check tsh in 6 months or thereabouts.  You have osteop[rosis on the bone density  . Because you had Gi problems with the fosamax.  Suggest considering PROLIA   Injection every 6 months .  Contact us if  You  Want to begin this so we can get the medicine for your.   continue to monitor blood pressure to make sure below 150   Denosumab injection What is this medicine? DENOSUMAB (den oh sue mab) slows bone breakdown. Prolia is used to treat osteoporosis in women after menopause and in men. Delton See is used to prevent bone fractures and other bone problems caused by cancer bone metastases. Delton See is also used to treat giant cell tumor of the bone. This medicine may be used for other purposes; ask your health care provider or pharmacist if you have questions. COMMON BRAND NAME(S): Prolia, XGEVA What should I tell my health care provider before I take this medicine? They need to know if you have any of these conditions: -dental disease -eczema -infection or history of infections -kidney disease or on dialysis -low blood calcium or vitamin D -malabsorption syndrome -scheduled to have surgery or tooth extraction -taking medicine that contains denosumab -thyroid or parathyroid disease -an unusual reaction to denosumab, other medicines, foods, dyes, or preservatives -pregnant or trying to get pregnant -breast-feeding How should I use this medicine? This medicine is for injection under the skin. It is given by a health care professional in a hospital or clinic setting. If you are getting Prolia, a special MedGuide will be given to you by the pharmacist with each prescription and refill. Be sure to read this information carefully each time. For Prolia, talk to your pediatrician regarding the use of this medicine in children. Special care may be needed. For Delton See, talk to your pediatrician regarding the use of this medicine in children. While  this drug may be prescribed for children as young as 13 years for selected conditions, precautions do apply. Overdosage: If you think you've taken too much of this medicine contact a poison control center or emergency room at once. Overdosage: If you think you have taken too much of this medicine contact a poison control center or emergency room at once. NOTE: This medicine is only for you. Do not share this medicine with others. What if I miss a dose? It is important not to miss your dose. Call your doctor or health care professional if you are unable to keep an appointment. What may interact with this medicine? Do not take this medicine with any of the following medications: -other medicines containing denosumab This medicine may also interact with the following medications: -medicines that suppress the immune system -medicines that treat cancer -steroid medicines like prednisone or cortisone This list may not describe all possible interactions. Give your health care provider a list of all the medicines, herbs, non-prescription drugs, or dietary supplements you use. Also tell them if you smoke, drink alcohol, or use illegal drugs. Some items may interact with your medicine. What should I watch for while using this medicine? Visit your doctor or health care professional for regular checks on your progress. Your doctor or health care professional may order blood tests and other tests to see how you are doing. Call your doctor or health care professional if you get a cold or other infection while receiving this medicine. Do not treat yourself. This medicine may decrease your body's ability to fight  infection. You should make sure you get enough calcium and vitamin D while you are taking this medicine, unless your doctor tells you not to. Discuss the foods you eat and the vitamins you take with your health care professional. See your dentist regularly. Brush and floss your teeth as directed. Before  you have any dental work done, tell your dentist you are receiving this medicine. Do not become pregnant while taking this medicine or for 5 months after stopping it. Women should inform their doctor if they wish to become pregnant or think they might be pregnant. There is a potential for serious side effects to an unborn child. Talk to your health care professional or pharmacist for more information. What side effects may I notice from receiving this medicine? Side effects that you should report to your doctor or health care professional as soon as possible: -allergic reactions like skin rash, itching or hives, swelling of the face, lips, or tongue -breathing problems -chest pain -fast, irregular heartbeat -feeling faint or lightheaded, falls -fever, chills, or any other sign of infection -muscle spasms, tightening, or twitches -numbness or tingling -skin blisters or bumps, or is dry, peels, or red -slow healing or unexplained pain in the mouth or jaw -unusual bleeding or bruising Side effects that usually do not require medical attention (Report these to your doctor or health care professional if they continue or are bothersome.): -muscle pain -stomach upset, gas This list may not describe all possible side effects. Call your doctor for medical advice about side effects. You may report side effects to FDA at 1-800-FDA-1088. Where should I keep my medicine? This medicine is only given in a clinic, doctor's office, or other health care setting and will not be stored at home. NOTE: This sheet is a summary. It may not cover all possible information. If you have questions about this medicine, talk to your doctor, pharmacist, or health care provider.  2015, [employer] Standard. (2012-06-14 12:37:47)

## 2014-10-25 ENCOUNTER — Telehealth: Payer: Self-pay | Admitting: Internal Medicine

## 2014-10-25 NOTE — Telephone Encounter (Signed)
emmi mailed  °

## 2014-11-20 DIAGNOSIS — H04552 Acquired stenosis of left nasolacrimal duct: Secondary | ICD-10-CM | POA: Diagnosis not present

## 2014-11-20 DIAGNOSIS — J3481 Nasal mucositis (ulcerative): Secondary | ICD-10-CM | POA: Diagnosis not present

## 2014-11-20 DIAGNOSIS — H04222 Epiphora due to insufficient drainage, left lacrimal gland: Secondary | ICD-10-CM | POA: Diagnosis not present

## 2014-12-07 ENCOUNTER — Emergency Department (HOSPITAL_COMMUNITY): Payer: Medicare Other

## 2014-12-07 ENCOUNTER — Emergency Department (HOSPITAL_COMMUNITY)
Admission: EM | Admit: 2014-12-07 | Discharge: 2014-12-07 | Disposition: A | Discharge status: Home / Self Care | Payer: Medicare Other | Attending: Emergency Medicine | Admitting: Emergency Medicine

## 2014-12-07 ENCOUNTER — Encounter (HOSPITAL_COMMUNITY): Payer: Self-pay | Admitting: Family Medicine

## 2014-12-07 DIAGNOSIS — S329XXA Fracture of unspecified parts of lumbosacral spine and pelvis, initial encounter for closed fracture: Secondary | ICD-10-CM

## 2014-12-07 DIAGNOSIS — Z8619 Personal history of other infectious and parasitic diseases: Secondary | ICD-10-CM | POA: Diagnosis not present

## 2014-12-07 DIAGNOSIS — Z87891 Personal history of nicotine dependence: Secondary | ICD-10-CM | POA: Diagnosis not present

## 2014-12-07 DIAGNOSIS — Z79899 Other long term (current) drug therapy: Secondary | ICD-10-CM | POA: Insufficient documentation

## 2014-12-07 DIAGNOSIS — S79912A Unspecified injury of left hip, initial encounter: Secondary | ICD-10-CM | POA: Diagnosis not present

## 2014-12-07 DIAGNOSIS — E039 Hypothyroidism, unspecified: Secondary | ICD-10-CM | POA: Diagnosis not present

## 2014-12-07 DIAGNOSIS — W19XXXA Unspecified fall, initial encounter: Secondary | ICD-10-CM

## 2014-12-07 DIAGNOSIS — Y9289 Other specified places as the place of occurrence of the external cause: Secondary | ICD-10-CM | POA: Diagnosis not present

## 2014-12-07 DIAGNOSIS — W1789XA Other fall from one level to another, initial encounter: Secondary | ICD-10-CM | POA: Insufficient documentation

## 2014-12-07 DIAGNOSIS — Z862 Personal history of diseases of the blood and blood-forming organs and certain disorders involving the immune mechanism: Secondary | ICD-10-CM | POA: Diagnosis not present

## 2014-12-07 DIAGNOSIS — Z7982 Long term (current) use of aspirin: Secondary | ICD-10-CM | POA: Diagnosis not present

## 2014-12-07 DIAGNOSIS — S0990XA Unspecified injury of head, initial encounter: Secondary | ICD-10-CM | POA: Diagnosis not present

## 2014-12-07 DIAGNOSIS — S32592A Other specified fracture of left pubis, initial encounter for closed fracture: Secondary | ICD-10-CM | POA: Diagnosis not present

## 2014-12-07 DIAGNOSIS — K219 Gastro-esophageal reflux disease without esophagitis: Secondary | ICD-10-CM | POA: Insufficient documentation

## 2014-12-07 DIAGNOSIS — Y9389 Activity, other specified: Secondary | ICD-10-CM | POA: Diagnosis not present

## 2014-12-07 DIAGNOSIS — Z86010 Personal history of colonic polyps: Secondary | ICD-10-CM | POA: Insufficient documentation

## 2014-12-07 DIAGNOSIS — M25552 Pain in left hip: Secondary | ICD-10-CM

## 2014-12-07 DIAGNOSIS — Y998 Other external cause status: Secondary | ICD-10-CM | POA: Diagnosis not present

## 2014-12-07 DIAGNOSIS — M25551 Pain in right hip: Secondary | ICD-10-CM | POA: Diagnosis not present

## 2014-12-07 DIAGNOSIS — S79919A Unspecified injury of unspecified hip, initial encounter: Secondary | ICD-10-CM | POA: Diagnosis not present

## 2014-12-07 MED ORDER — TRAMADOL HCL 50 MG PO TABS: 50.0000 mg | ORAL_TABLET | Freq: Four times a day (QID) | ORAL | Status: DC | PRN

## 2014-12-07 MED ORDER — TRAMADOL HCL 50 MG PO TABS
50.0000 mg | ORAL_TABLET | Freq: Once | ORAL | Status: AC
  Administered 2014-12-07: 50 mg via ORAL
  Filled 2014-12-07: qty 1

## 2014-12-07 NOTE — ED Notes (Signed)
Pt is leaving with daughter, [name], to home. Pt is leaving via a wheelchair with ED staff and is in stable condition.

## 2014-12-07 NOTE — ED Notes (Signed)
Pt d/c is complete and pt is dressed and ready to go. Final VS have been taken, and currently waiting for Case Manager, RN to bring walker.

## 2014-12-07 NOTE — ED Provider Notes (Addendum)
CSN: [id number]     Arrival date & time 12/07/14  1013 History   First MD Initiated Contact with Patient 12/07/14 1015     Chief Complaint  Patient presents with  . Fall     (Consider location/radiation/quality/duration/timing/severity/associated sxs/prior Treatment) Patient is a 80 y.o. female presenting with fall. The history is provided by the patient.  Fall Associated symptoms include headaches. Pertinent negatives include no chest pain, no abdominal pain and no shortness of breath.   patient with fall on Tuesday. Patient slipped off walkway landed in [place]. Did strike the anterior part of her head and landed on her left side. Initially without too many complaints. Now with a low bit of mild head pain but the main thing is development of left groin pain and left hip pain. No chest pain no shortness of breath no neck pain no back pain no abdominal pain no nausea or vomiting.  Past Medical History  Diagnosis Date  . GERD (gastroesophageal reflux disease)   . Hyperlipidemia   . Hypothyroidism   . Osteoarthritis   . Osteoporosis   . Episodic recurrent vertigo     MRI Head 2003  . Allergy   . Bladder polyps   . Subclavian steal syndrome     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; CTA 2013 revealed open graft  . Hyperplastic colon polyp   . Esophageal spasm   . History of hepatitis     unknown type  . Asymptomatic carotid artery stenosis     R ICA 40% stenosis on CTA 12/2011.  [name] Anemia   . Cataract     BILATERAL-REMOVED  . Elevated blood pressure 03/25/2011    Bp readings borderline today has hx of elvation  In office and ok at home   Not checke recently   She gfeels was elevated from anxiety    . Diverticulosis   . Intestinal metaplasia of gastric mucosa   . Hepatic hemangioma    Past Surgical History  Procedure Laterality Date  . Appendectomy    . Cholecystectomy    . Carotid-subclavian bypass graft Left 1999    for [place] steal syndrome  . Rotator cuff repair  Left   . Elbow surgery Left   . Cystectomy Left     hand  . Tubal ligation    . Tonsillectomy    . Colonoscopy    . Tear duct probing  07/2014   Family History  Problem Relation Age of Onset  . Hypertension Mother   . Heart disease Father   . Stroke Mother   . Colon cancer Neg Hx    History  Substance Use Topics  . Smoking status: Former Smoker    Quit date: 01/05/1991  . Smokeless tobacco: Never Used  . Alcohol Use: 2.4 oz/week    4 Glasses of wine per week     Comment: socially   OB History    No data available     Review of Systems  Constitutional: Negative for fever.  HENT: Negative for congestion.   Eyes: Negative for visual disturbance.  Respiratory: Negative for shortness of breath.   Cardiovascular: Negative for chest pain.  Gastrointestinal: Negative for nausea, vomiting and abdominal pain.  Genitourinary: Negative for hematuria.  Musculoskeletal: Negative for back pain and neck pain.  Skin: Negative for rash and wound.  Neurological: Positive for headaches.  Hematological: Does not bruise/bleed easily.  Psychiatric/Behavioral: Negative for confusion.      Allergies  Augmentin; Codeine; Risedronate  sodium; and Statins  Home Medications   Prior to Admission medications   Medication Sig Start Date End Date Taking? Authorizing Provider  aspirin 325 MG tablet Take 325 mg by mouth daily.     Yes Historical Provider, MD  azelastine (ASTELIN) 0.1 % nasal spray Place 2 sprays into both nostrils 2 (two) times daily. Use in each nostril as directed 07/26/14  Yes Burnis Medin, MD  calcium-vitamin D (OSCAL WITH D) 500-200 MG-UNIT per tablet Take 1 tablet by mouth daily.    Yes Historical Provider, MD  Cholecalciferol (VITAMIN D3) 1000 UNITS CAPS Take 1 capsule by mouth daily.    Yes Historical Provider, MD  ezetimibe (ZETIA) 10 MG tablet Take 10 mg by mouth daily.   Yes Historical Provider, MD  fexofenadine (ALLEGRA) 180 MG tablet Take 180 mg by mouth daily.    Yes Historical Provider, MD  fish oil-omega-3 fatty acids 1000 MG capsule Take 1 g by mouth daily.    Yes Historical Provider, MD  FLUZONE HIGH-DOSE 0.5 ML SUSY Inject 0.5 mLs as directed once. 09/17/14  Yes Historical Provider, MD  glucosamine-chondroitin 500-400 MG tablet Take 1 tablet by mouth daily.    Yes Historical Provider, MD  ibuprofen (ADVIL,MOTRIN) 200 MG tablet Take 200 mg by mouth every 6 (six) hours as needed. Patient used this medication for a headache.   Yes Historical Provider, MD  levothyroxine (SYNTHROID, LEVOTHROID) 88 MCG tablet TAKE 1 TABLET DAILY Patient taking differently: Take 88 mcg by mouth daily.  07/26/14  Yes Burnis Medin, MD  Melatonin 3 MG TABS Take 1 tablet by mouth every other day.    Yes Historical Provider, MD  multivitamin [facility]) per tablet Take 1 tablet by mouth daily.     Yes Historical Provider, MD  nebivolol (BYSTOLIC) 2.5 MG tablet Take 1 tablet (2.5 mg total) by mouth daily. 07/26/14  Yes Burnis Medin, MD  omeprazole (PRILOSEC) 40 MG capsule Take 1 capsule (40 mg total) by mouth daily. 07/26/14  Yes Burnis Medin, MD  rosuvastatin (CRESTOR) 5 MG tablet Take 0.5 tablets (2.5 mg total) by mouth daily. 07/26/14  Yes Burnis Medin, MD  levothyroxine (SYNTHROID, LEVOTHROID) 88 MCG tablet TAKE 1 TABLET DAILY Patient not taking: Reported on 12/07/2014 09/28/14   Burnis Medin, MD  nitroGLYCERIN (NITROSTAT) 0.4 MG SL tablet Place 0.4 mg under the tongue every 5 (five) minutes as needed for chest pain.    Historical Provider, MD   BP 134/49 mmHg  Pulse 57  Temp(Src) 98.5 F (36.9 C) (Oral)  Resp 20  Ht 5\' 3"  (1.6 m)  Wt 123 lb (55.792 kg)  BMI 21.79 kg/m2  SpO2 97% Physical Exam  Constitutional: She is oriented to person, place, and time. She appears well-developed and well-nourished. No distress.  HENT:  Head: Normocephalic and atraumatic.  Mouth/Throat: Oropharynx is clear and moist.  Eyes: Conjunctivae and EOM are normal. Pupils are equal,  round, and reactive to light.  Neck: Normal range of motion.  Cardiovascular: Normal rate, regular rhythm and normal heart sounds.   No murmur heard. Pulmonary/Chest: Effort normal and breath sounds normal. No respiratory distress.  Abdominal: Soft. Bowel sounds are normal. There is no tenderness.  Musculoskeletal: She exhibits tenderness.  Mild tenderness left hip to range of motion and palpation.  Neurological: She is alert and oriented to person, place, and time. No cranial nerve deficit. She exhibits normal muscle tone. Coordination normal.  Skin: Skin is warm. No rash noted.  Nursing note and vitals reviewed.   ED Course  Procedures (including critical care time) Labs Review Labs Reviewed - No data to display  Imaging Review Dg Hip Bilateral W/pelvis  12/07/2014   CLINICAL DATA:  80 year old female fell in front yard 2 days ago. Pain involving both hips. Initial encounter.  EXAM: BILATERAL HIP WITH PELVIS - 4+ VIEW  COMPARISON:  Abdominal series 4 01/2014.  FINDINGS: Bone mineralization is within normal limits for age. Femoral heads remain normally located. Hip joint spaces are stable and normal for age. Pelvis intact. sacral ala and SI joints within normal limits. Chronic L4-L5 disc and endplate degeneration re - identified. AP and frog-leg lateral views of both hips. Proximal right femur intact. Proximal left femur intact.  Aortoiliac calcified atherosclerosis noted.  IMPRESSION: 1. No acute fracture or dislocation identified about the bilateral hips or pelvis.   Electronically Signed   By: Lars Pinks M.D.   On: 12/07/2014 12:48   Ct Head Wo Contrast  12/07/2014   CLINICAL DATA:  80 year old. [place] 12/05/2014. Denies loss of consciousness. Hit anterior head. The patient woke up this morning with increased pain to the left groin and left lower extremity.  EXAM: CT HEAD WITHOUT CONTRAST  TECHNIQUE: Contiguous axial images were obtained from the base of the skull through the vertex without  intravenous contrast.  COMPARISON:  01/07/2012  FINDINGS: There is mild central and cortical atrophy. Periventricular white matter changes are consistent with small vessel disease. There is no intra or extra-axial fluid collection or mass lesion. The basilar cisterns and ventricles have a normal appearance. There is no CT evidence for acute infarction or hemorrhage. Bone windows are unremarkable. There is atherosclerotic calcification of the internal carotid arteries.  IMPRESSION: 1. Atrophy and small vessel disease. 2.  No evidence for acute  abnormality.   Electronically Signed   By: Shon Hale M.D.   On: 12/07/2014 15:37   Ct Hip Left Wo Contrast  12/07/2014   CLINICAL DATA:  Pt denies LOC but reports she did hit her anterior head. Pt reports she felt fine Tuesday and Wednesday but woke this morning with increased pain to left groin and LLE.  EXAM: CT OF THE LEFT HIP WITHOUT CONTRAST  TECHNIQUE: Multidetector CT imaging of the left hip was performed according to the standard protocol. Multiplanar CT image reconstructions were also generated.  COMPARISON:  None.  FINDINGS: There is no left hip fracture or dislocation. There is a mildly comminuted fracture of the left superior pubic ramus. There is a nondisplaced fracture of the left inferior pubic ramus extending into the pubic body.  There is no lytic or sclerotic osseous lesion.  There is no fluid collection or hematoma. There is mild soft tissue contusion in the subcutaneous fat overlying the left greater trochanter.  There is degenerative disc disease with a broad-based disc bulge at L4-5 and bilateral severe facet arthropathy. There is a broad-based disc bulge at L5-S1 with severe bilateral facet arthropathy.  IMPRESSION: 1. No left hip fracture or dislocation. 2. Nondisplaced fracture of the left inferior pubic ramus extending into the pubic body. Comminuted, nondisplaced fracture of the left superior pubic ramus.   Electronically Signed   By: Kathreen Devoid   On: 12/07/2014 15:49     EKG Interpretation None      MDM   Final diagnoses:  Fall  Hip pain, acute, left  Head injury, initial encounter  Pelvic fracture, closed, initial encounter    Patient with a fall  on Tuesday. Patient slipped off the edge of concrete walk and fell into [place]. Patient denied any loss of consciousness. She did hit the anterior part of her head. Patient initially felt fine. And now is starting to complain of some left groin pain and some left hip pain and a little bit of head pain. X-rays of the pelvis and the hips negative. Therefore ordered CT head and left hip to rule out a hairline fracture the hip. In just to further evaluate the head pain. Patient without any significant focal neural deficits. If the scans are negative patient can be discharged home.  Head CT is negative. No acute injury. CT of the left hip shows no evidence of a hip fracture hour does show evidence of an inferior and superior pubic rami fracture. Nondisplaced. This will require orthopedic follow-up treatment with pain medicine. Patient can ambulate.  Fredia Sorrow, MD 12/07/14 1535  Fredia Sorrow, MD 12/07/14 1556

## 2014-12-07 NOTE — ED Notes (Signed)
Case Manager RN aware of need for walker.  States will bring to bedside ASAP.

## 2014-12-07 NOTE — Discharge Instructions (Signed)
CT of head was negative. CT of left hip was negative for fracture but showed evidence of nondisplaced pelvic fracture superior and inferior pubic rami. Take tramadol as needed for pain. Use the walker as needed. It is okay to walk. Make an appointment to follow-up with orthopedics. Return for any new or worse symptoms.

## 2014-12-07 NOTE — ED Notes (Signed)
Pt presents from home with c/o fall tha occurred Tuesday. Pt denies LOC but reports she did hit her anterior head. Pt reports she felt fine Tuesday and Wednesday but woke this morning with increased pain to left groin and LLE.

## 2014-12-13 DIAGNOSIS — S329XXA Fracture of unspecified parts of lumbosacral spine and pelvis, initial encounter for closed fracture: Secondary | ICD-10-CM | POA: Diagnosis not present

## 2015-01-02 ENCOUNTER — Telehealth: Payer: Self-pay | Admitting: Internal Medicine

## 2015-01-02 NOTE — Telephone Encounter (Signed)
I dont have a form and dont remember seeing one. What is an antiplatelet form?

## 2015-01-02 NOTE — Telephone Encounter (Signed)
I do not have this paper work.  Will check with [facility]

## 2015-01-02 NOTE — Telephone Encounter (Signed)
Amber with Dr. Linton Rump office calling to check the status of anti-platelet form faxed over on 12/25/14.  Please call her back at [phone]

## 2015-01-03 NOTE — Telephone Encounter (Signed)
Spoke to [employer] and informed her I do not have any paper work for this pt.  She will refax.

## 2015-01-05 ENCOUNTER — Telehealth: Payer: Self-pay | Admitting: Internal Medicine

## 2015-01-05 NOTE — Telephone Encounter (Signed)
Pt said she received paperwork from Dr Lorina Rabon. She will be having surgery to open up her tear duct.  He is asking that she confirm with Dr Regis Bill that she can come off her aspirin 2 weeks before surgery and one week after surgery.   Surgery is schedule for 01/22/15

## 2015-01-08 NOTE — Telephone Encounter (Signed)
pls advise

## 2015-01-10 NOTE — Telephone Encounter (Signed)
Form faxed back to Dr. Isidoro Donning and confirmation received.

## 2015-01-10 NOTE — Telephone Encounter (Signed)
Ok  To stop the asa pre surgery

## 2015-01-10 NOTE — Telephone Encounter (Signed)
Amber states they will have to cancel the patient's surgery if a response isn't received back in time.

## 2015-01-10 NOTE — Telephone Encounter (Signed)
Amy Fischer with Dr. Linton Rump office calling to check the status of anti-platelet form faxed over on 12/25/14. Please call her back at [phone].

## 2015-01-22 ENCOUNTER — Other Ambulatory Visit: Payer: Self-pay | Admitting: Ophthalmology

## 2015-01-22 DIAGNOSIS — H04412 Chronic dacryocystitis of left lacrimal passage: Secondary | ICD-10-CM | POA: Diagnosis not present

## 2015-01-22 DIAGNOSIS — H04552 Acquired stenosis of left nasolacrimal duct: Secondary | ICD-10-CM | POA: Diagnosis not present

## 2015-01-22 DIAGNOSIS — H04222 Epiphora due to insufficient drainage, left lacrimal gland: Secondary | ICD-10-CM | POA: Diagnosis not present

## 2015-01-26 ENCOUNTER — Other Ambulatory Visit: Payer: Self-pay | Admitting: *Deleted

## 2015-01-26 NOTE — Telephone Encounter (Signed)
Pharmacy requests a refill on Zetia 10mg -90 day supply.

## 2015-01-29 MED ORDER — EZETIMIBE 10 MG PO TABS: 10.0000 mg | ORAL_TABLET | Freq: Every day | ORAL | Status: DC

## 2015-01-29 NOTE — Telephone Encounter (Signed)
Sent to the pharmacy by e-scribe.  Pt has upcoming appt on 04/24/15

## 2015-02-05 ENCOUNTER — Other Ambulatory Visit: Payer: Self-pay

## 2015-02-05 MED ORDER — EZETIMIBE 10 MG PO TABS: 10.0000 mg | ORAL_TABLET | Freq: Every day | ORAL | Status: DC

## 2015-02-05 NOTE — Telephone Encounter (Signed)
Received a fax from [employer] stating pt gets Zetia through mail order and it has not come in yet.  Pt would like to get this prescripton sent locally until medication arrives.  Please advise.

## 2015-02-05 NOTE — Telephone Encounter (Signed)
Sent to the local pharmacy by e-scribe.

## 2015-02-08 ENCOUNTER — Other Ambulatory Visit: Payer: Self-pay | Admitting: Internal Medicine

## 2015-02-08 NOTE — Telephone Encounter (Signed)
Sent to the pharmacy by e-scribe. 

## 2015-02-19 DIAGNOSIS — H04552 Acquired stenosis of left nasolacrimal duct: Secondary | ICD-10-CM | POA: Diagnosis not present

## 2015-02-19 DIAGNOSIS — J3481 Nasal mucositis (ulcerative): Secondary | ICD-10-CM | POA: Diagnosis not present

## 2015-02-19 DIAGNOSIS — H04222 Epiphora due to insufficient drainage, left lacrimal gland: Secondary | ICD-10-CM | POA: Diagnosis not present

## 2015-03-12 DIAGNOSIS — H04222 Epiphora due to insufficient drainage, left lacrimal gland: Secondary | ICD-10-CM | POA: Diagnosis not present

## 2015-03-12 DIAGNOSIS — J3481 Nasal mucositis (ulcerative): Secondary | ICD-10-CM | POA: Diagnosis not present

## 2015-03-12 DIAGNOSIS — H532 Diplopia: Secondary | ICD-10-CM | POA: Diagnosis not present

## 2015-03-22 ENCOUNTER — Encounter: Payer: Self-pay | Admitting: Family Medicine

## 2015-03-22 ENCOUNTER — Ambulatory Visit (INDEPENDENT_AMBULATORY_CARE_PROVIDER_SITE_OTHER): Payer: Medicare Other | Admitting: Family Medicine

## 2015-03-22 VITALS — BP 118/68 | HR 80 | Temp 98.3°F | Wt 119.0 lb

## 2015-03-22 DIAGNOSIS — R509 Fever, unspecified: Secondary | ICD-10-CM

## 2015-03-22 DIAGNOSIS — J029 Acute pharyngitis, unspecified: Secondary | ICD-10-CM

## 2015-03-22 MED ORDER — AZITHROMYCIN 250 MG PO TABS: ORAL_TABLET | ORAL | Status: AC

## 2015-03-22 NOTE — Patient Instructions (Signed)
Drink plenty of fluids Follow up for any persistent fever or increasing shortness of breath

## 2015-03-22 NOTE — Progress Notes (Signed)
Subjective:    Patient ID: Amy Fischer, female    DOB: 1929-11-14, 81 y.o.   MRN: [id number]  HPI Acute visit. Patient seen with 1 and one half week history of upper restaurant symptoms. She's had some cough which over the past couple days has been productive of brown to green sputum. She was concerned because last night she developed fever for the first time 100.3. She had some chills last night. Intermittent headache. Sinus pressure. Fatigue. Denies any nausea or vomiting. Nonsmoker.  Past Medical History  Diagnosis Date  . GERD (gastroesophageal reflux disease)   . Hyperlipidemia   . Hypothyroidism   . Osteoarthritis   . Osteoporosis   . Episodic recurrent vertigo     MRI Head 2003  . Allergy   . Bladder polyps   . Subclavian steal syndrome     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; CTA 2013 revealed open graft  . Hyperplastic colon polyp   . Esophageal spasm   . History of hepatitis     unknown type  . Asymptomatic carotid artery stenosis     R ICA 40% stenosis on CTA 12/2011.  [name] Anemia   . Cataract     BILATERAL-REMOVED  . Elevated blood pressure 03/25/2011    Bp readings borderline today has hx of elvation  In office and ok at home   Not checke recently   She gfeels was elevated from anxiety    . Diverticulosis   . Intestinal metaplasia of gastric mucosa   . Hepatic hemangioma    Past Surgical History  Procedure Laterality Date  . Appendectomy    . Cholecystectomy    . Carotid-subclavian bypass graft Left 1999    for [place] steal syndrome  . Rotator cuff repair Left   . Elbow surgery Left   . Cystectomy Left     hand  . Tubal ligation    . Tonsillectomy    . Colonoscopy    . Tear duct probing  07/2014    reports that she quit smoking about 24 years ago. She has never used smokeless tobacco. She reports that she drinks about 2.4 oz of alcohol per week. She reports that she does not use illicit drugs. family history includes Heart disease in her father;  Hypertension in her mother; Stroke in her mother. There is no history of Colon cancer. Allergies  Allergen Reactions  . Augmentin [Amoxicillin-Pot Clavulanate] Diarrhea    Not allergic  2014  . Codeine Nausea And Vomiting  . Risedronate Sodium     unknown  . Statins     Muscles hurt       Review of Systems  Constitutional: Positive for fever, chills and fatigue.  HENT: Positive for congestion and sinus pressure.   Respiratory: Positive for cough.   Gastrointestinal: Negative for abdominal pain.       Objective:   Physical Exam  Constitutional: She appears well-developed and well-nourished.  HENT:  Left eardrum is obscured by cerumen. Right eardrum normal. Oropharynx reveals thick purulent appearing yellow sputum posterior pharynx. No erythema  Neck: Neck supple.  Cardiovascular: Normal rate and regular rhythm.   Pulmonary/Chest: Effort normal and breath sounds normal. No respiratory distress. She has no wheezes. She has no rales.  Lymphadenopathy:    She has no cervical adenopathy.  Neurological: She is alert.          Assessment & Plan:  Acute visit. Patient presents with initially what sounded like cold-like symptoms. Concerning  is that she developed low-grade fever last night. Nonfocal lung exam but she has thick purulent secretions in posterior pharynx. Previous intolerance with Augmentin. Start Zithromax. Follow-up promptly for increasing fever or shortness of breath or other worsening symptoms.

## 2015-03-22 NOTE — Progress Notes (Signed)
Pre visit review using our clinic review tool, if applicable. No additional management support is needed unless otherwise documented below in the visit note. 

## 2015-04-05 DIAGNOSIS — H532 Diplopia: Secondary | ICD-10-CM | POA: Diagnosis not present

## 2015-04-09 ENCOUNTER — Other Ambulatory Visit: Payer: Self-pay | Admitting: Internal Medicine

## 2015-04-09 NOTE — Telephone Encounter (Signed)
Sent to the pharmacy by e-scribe.  Pt has upcoming appt on 04/26/15

## 2015-04-17 ENCOUNTER — Other Ambulatory Visit (INDEPENDENT_AMBULATORY_CARE_PROVIDER_SITE_OTHER): Payer: Medicare Other

## 2015-04-17 DIAGNOSIS — E039 Hypothyroidism, unspecified: Secondary | ICD-10-CM | POA: Diagnosis not present

## 2015-04-17 LAB — TSH: TSH: 18.13 u[IU]/mL — ABNORMAL HIGH (ref 0.35–4.50)

## 2015-04-24 ENCOUNTER — Ambulatory Visit: Payer: Medicare Other | Admitting: Internal Medicine

## 2015-04-25 DIAGNOSIS — H5021 Vertical strabismus, right eye: Secondary | ICD-10-CM | POA: Diagnosis not present

## 2015-04-26 ENCOUNTER — Ambulatory Visit (INDEPENDENT_AMBULATORY_CARE_PROVIDER_SITE_OTHER): Payer: Medicare Other | Admitting: Internal Medicine

## 2015-04-26 ENCOUNTER — Encounter: Payer: Self-pay | Admitting: Internal Medicine

## 2015-04-26 VITALS — BP 126/60 | Temp 97.7°F | Ht 62.5 in | Wt 122.0 lb

## 2015-04-26 DIAGNOSIS — M810 Age-related osteoporosis without current pathological fracture: Secondary | ICD-10-CM

## 2015-04-26 DIAGNOSIS — Z8781 Personal history of (healed) traumatic fracture: Secondary | ICD-10-CM

## 2015-04-26 DIAGNOSIS — E039 Hypothyroidism, unspecified: Secondary | ICD-10-CM | POA: Diagnosis not present

## 2015-04-26 DIAGNOSIS — H532 Diplopia: Secondary | ICD-10-CM | POA: Diagnosis not present

## 2015-04-26 MED ORDER — LEVOTHYROXINE SODIUM 100 MCG PO TABS: 100.0000 ug | ORAL_TABLET | Freq: Every day | ORAL | Status: DC

## 2015-04-26 NOTE — Patient Instructions (Signed)
I  agree with   Going on prolia as a trial for osteoporosis treatment .  Increase the thyroid medicine.  To 100 mcg  per day.  And will recheck  tsh in 6 weeks  And then make a plan .  Please calendar days you either miss the  Thyroid edication or take later.

## 2015-04-26 NOTE — Progress Notes (Signed)
Pre visit review using our clinic review tool, if applicable. No additional management support is needed unless otherwise documented below in the visit note.  Chief Complaint  Patient presents with  . Follow-up    thyroid osteoporosis    HPI: JOELEE Fischer comes in with  Fu .    Abnormal tsh and update other issues   Had   pelvic fracture  In winter        Fell on pine needles and  Had fracture  immobility  For 10+ weeks saw dr Amy Fischer now better  . Family had  Opined that since she had se of meds to not go forward with the prolia . But now she wants to try this   Because of this hx of fracture   Developed Diplopia  Over the past 4-6 months  And eye dic   Seen eye and Dr Amy Fischer.   Glasses with prism  On one side.  difficult to use   Had to stents that didn't work for her eyes.  Taking thyroid med every day with ocass miss perhaps  1-2 per month no change in  Med pill etc nor hwo she is taking  It   Had coughing illness   Better but still coughing  Had z pack  ROS: See pertinent positives and negatives per HPI. No cp sob  Other change  except as above   Past Medical History  Diagnosis Date  . GERD (gastroesophageal reflux disease)   . Hyperlipidemia   . Hypothyroidism   . Osteoarthritis   . Osteoporosis   . Episodic recurrent vertigo     MRI Head 2003  . Allergy   . Bladder polyps   . Subclavian steal syndrome     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; CTA 2013 revealed open graft  . Hyperplastic colon polyp   . Esophageal spasm   . History of hepatitis     unknown type  . Asymptomatic carotid artery stenosis     R ICA 40% stenosis on CTA 12/2011.  [name] Anemia   . Cataract     BILATERAL-REMOVED  . Elevated blood pressure 03/25/2011    Bp readings borderline today has hx of elvation  In office and ok at home   Not checke recently   She gfeels was elevated from anxiety    . Diverticulosis   . Intestinal metaplasia of gastric mucosa   . Hepatic hemangioma     Family  History  Problem Relation Age of Onset  . Hypertension Mother   . Heart disease Father   . Stroke Mother   . Colon cancer Neg Hx     History   Social History  . Marital Status: Widowed    Spouse Name: N/A  . Number of Children: 4  . Years of Education: N/A   Occupational History  . retired    Social History Main Topics  . Smoking status: Former Smoker    Quit date: 01/05/1991  . Smokeless tobacco: Never Used  . Alcohol Use: 2.4 oz/week    4 Glasses of wine per week     Comment: socially  . Drug Use: No  . Sexual Activity: Not on file   Other Topics Concern  . None   Social History Narrative   Widowed   HH of 1    No pets   Former smoker   Exercises regularly    Outpatient Encounter Prescriptions as of 04/26/2015  Medication Sig  .  aspirin 325 MG tablet Take 325 mg by mouth daily.    [name] azelastine (ASTELIN) 0.1 % nasal spray Place 2 sprays into both nostrils 2 (two) times daily. Use in each nostril as directed  . diphenhydrAMINE (SOMINEX) 25 MG tablet Take 25 mg by mouth at bedtime as needed for sleep.  [name] ezetimibe (ZETIA) 10 MG tablet Take 1 tablet (10 mg total) by mouth daily.  . fexofenadine (ALLEGRA) 180 MG tablet Take 180 mg by mouth daily.  . fish oil-omega-3 fatty acids 1000 MG capsule Take 1 g by mouth daily.   [name] glucosamine-chondroitin 500-400 MG tablet Take 1 tablet by mouth 3 (three) times daily.   [name] ibuprofen (ADVIL,MOTRIN) 200 MG tablet Take 200 mg by mouth every 6 (six) hours as needed. Patient used this medication for a headache.  . Melatonin 5 MG CAPS Take 1 capsule by mouth at bedtime as needed.  . MULTIPLE VITAMIN PO 50+ Senior Vitamin Daily  . nebivolol (BYSTOLIC) 2.5 MG tablet Take 1 tablet (2.5 mg total) by mouth daily.  . NON FORMULARY Calcium 600, Magnesium 300, D3 1000  . omeprazole (PRILOSEC) 40 MG capsule TAKE 1 CAPSULE DAILY  . Polyethyl Glycol-Propyl Glycol (SYSTANE OP) Apply to eye. Use 1-2 Drops in both eyes  . rosuvastatin (CRESTOR)  5 MG tablet Take 0.5 tablets (2.5 mg total) by mouth daily.  [name] SALINE NASAL SPRAY NA   . vitamin E 400 UNIT capsule Take 400 Units by mouth daily.  [name] ZETIA 10 MG tablet TAKE 1 TABLET DAILY  . [DISCONTINUED] levothyroxine (SYNTHROID, LEVOTHROID) 88 MCG tablet TAKE 1 TABLET DAILY (Patient taking differently: Take 88 mcg by mouth daily. )  . levothyroxine (SYNTHROID) 100 MCG tablet Take 1 tablet (100 mcg total) by mouth daily before breakfast.  . [DISCONTINUED] calcium-vitamin D (OSCAL WITH D) 500-200 MG-UNIT per tablet Take 1 tablet by mouth daily.   . [DISCONTINUED] Cholecalciferol (VITAMIN D3) 1000 UNITS CAPS Take 1 capsule by mouth daily.   . [DISCONTINUED] FLUZONE HIGH-DOSE 0.5 ML SUSY Inject 0.5 mLs as directed once.  . [DISCONTINUED] Melatonin 3 MG TABS Take 1 tablet by mouth every other day.   . [DISCONTINUED] multivitamin (THERAGRAN) per tablet Take 1 tablet by mouth daily.    . [DISCONTINUED] nitroGLYCERIN (NITROSTAT) 0.4 MG SL tablet Place 0.4 mg under the tongue every 5 (five) minutes as needed for chest pain.  . [DISCONTINUED] traMADol (ULTRAM) 50 MG tablet Take 1 tablet (50 mg total) by mouth every 6 (six) hours as needed.    EXAM:  BP 126/60 mmHg  Temp(Src) 97.7 F (36.5 C) (Oral)  Ht 5' 2.5" (1.588 m)  Wt 122 lb (55.339 kg)  BMI 21.94 kg/m2  Body mass index is 21.94 kg/(m^2).  GENERAL: vitals reviewed and listed above, alert, oriented, appears well hydrated and in no acute distress HEENT: atraumatic, conjunctiva  clear, no obvious abnormalities on inspection of external nose and ears  left eye some tearing NECK: no obvious masses on inspection palpation  LUNGS: clear to auscultation bilaterally, no wheezes, rales or rhonchi, good air movement CV: HRRR, no clubbing cyanosis or  peripheral edema nl cap refill  MS: moves all extremities without  OA changes  No redness  PSYCH: pleasant and cooperative, no obvious depression or anxiety Lab Results  Component Value Date   WBC  5.6 03/31/2014   HGB 12.5 03/31/2014   HCT 36.8 03/31/2014   PLT 164 03/31/2014   GLUCOSE 80 07/19/2014   CHOL 163 07/19/2014  TRIG 130.0 07/19/2014   HDL 63.00 07/19/2014   LDLCALC 74 07/19/2014   ALT 19 07/19/2014   AST 20 07/19/2014   NA 141 07/19/2014   K 4.5 07/19/2014   CL 106 07/19/2014   CREATININE 0.9 07/19/2014   BUN 20 07/19/2014   CO2 28 07/19/2014   TSH 18.13* 04/17/2015   HGBA1C 5.5 05/24/2013    ASSESSMENT AND PLAN:  Discussed the following assessment and plan:  Hypothyroidism, unspecified hypothyroidism type - tsh up no explanation for deterioation  inc to 100 per day check in 6 weeks  Osteoporosis - reveiwed pt has hx se of meds now willing to proceed with prolia trial.  Hx of fracture  Diplopia - felt to be from eom problems  not cnssaw dr Amy Fischer.  office notes  pending On 58     Uncertain why not effective seems to be taking correctly with minimal missing  reviewed  meds  CMA went through all of her bottles  And  Checked  Every think seems as is on med list now.  Pt now interested in prolia trial as she has had a other fracture with falling  Wellness visit end fo July or after  -Patient advised to return or notify health care team  if symptoms worsen ,persist or new concerns arise.  Patient Instructions  I  agree with   Going on prolia as a trial for osteoporosis treatment .  Increase the thyroid medicine.  To 100 mcg  per day.  And will recheck  tsh in 6 weeks  And then make a plan .  Please calendar days you either miss the  Thyroid edication or take later.      [name]. Dhani Dannemiller M.D.

## 2015-05-14 ENCOUNTER — Encounter: Payer: Self-pay | Admitting: Internal Medicine

## 2015-05-14 ENCOUNTER — Ambulatory Visit (INDEPENDENT_AMBULATORY_CARE_PROVIDER_SITE_OTHER)
Admission: RE | Admit: 2015-05-14 | Discharge: 2015-05-14 | Disposition: A | Discharge status: Home / Self Care | Payer: Medicare Other | Source: Ambulatory Visit | Attending: Internal Medicine | Admitting: Internal Medicine

## 2015-05-14 ENCOUNTER — Ambulatory Visit (INDEPENDENT_AMBULATORY_CARE_PROVIDER_SITE_OTHER): Payer: Medicare Other | Admitting: Internal Medicine

## 2015-05-14 VITALS — BP 120/62 | HR 68 | Temp 98.6°F | Resp 18 | Ht 62.5 in | Wt 118.0 lb

## 2015-05-14 DIAGNOSIS — R509 Fever, unspecified: Secondary | ICD-10-CM | POA: Diagnosis not present

## 2015-05-14 DIAGNOSIS — R54 Age-related physical debility: Secondary | ICD-10-CM

## 2015-05-14 DIAGNOSIS — J989 Respiratory disorder, unspecified: Secondary | ICD-10-CM

## 2015-05-14 DIAGNOSIS — IMO0001 Reserved for inherently not codable concepts without codable children: Secondary | ICD-10-CM

## 2015-05-14 DIAGNOSIS — R05 Cough: Secondary | ICD-10-CM | POA: Diagnosis not present

## 2015-05-14 DIAGNOSIS — J069 Acute upper respiratory infection, unspecified: Secondary | ICD-10-CM | POA: Diagnosis not present

## 2015-05-14 MED ORDER — DOXYCYCLINE HYCLATE 100 MG PO TABS: 100.0000 mg | ORAL_TABLET | Freq: Two times a day (BID) | ORAL | Status: DC

## 2015-05-14 MED ORDER — BENZONATATE 100 MG PO CAPS: 100.0000 mg | ORAL_CAPSULE | Freq: Three times a day (TID) | ORAL | Status: DC | PRN

## 2015-05-14 NOTE — Patient Instructions (Signed)
Will let you know about chest x ray results . Add antibiotic if we tell you to based on findings or if not getting better in next  few daysor having  with persistent fever . Continue fluids rest .

## 2015-05-14 NOTE — Progress Notes (Signed)
Pre visit review using our clinic review tool, if applicable. No additional management support is needed unless otherwise documented below in the visit note. 

## 2015-05-14 NOTE — Progress Notes (Signed)
Chief Complaint  Patient presents with  . Acute Visit    cough, fever, runny nose    HPI: Patient Amy Fischer  comes in today for SDA for  new problem evaluation. Onset sore throat about 12-14 days ago  and sinus on congestion and coughing badly and had streaks of blood from sever coughing    Kids said to come in   She waited cause no fever   But this weekend had fever just over 100. Uncertain last pm. Nose running still no face pain   Cough bad ocass sob   Doxy ? If  Causes diarrhea.  augmentin does  No documentation in record of diarrhea  ROS: See pertinent positives and negatives per HPI.no cp pleurisy chills   Past Medical History  Diagnosis Date  . GERD (gastroesophageal reflux disease)   . Hyperlipidemia   . Hypothyroidism   . Osteoarthritis   . Osteoporosis   . Episodic recurrent vertigo     MRI Head 2003  . Allergy   . Bladder polyps   . Subclavian steal syndrome     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; CTA 2013 revealed open graft  . Hyperplastic colon polyp   . Esophageal spasm   . History of hepatitis     unknown type  . Asymptomatic carotid artery stenosis     R ICA 40% stenosis on CTA 12/2011.  [name] Anemia   . Cataract     BILATERAL-REMOVED  . Elevated blood pressure 03/25/2011    Bp readings borderline today has hx of elvation  In office and ok at home   Not checke recently   She gfeels was elevated from anxiety    . Diverticulosis   . Intestinal metaplasia of gastric mucosa   . Hepatic hemangioma     Family History  Problem Relation Age of Onset  . Hypertension Mother   . Heart disease Father   . Stroke Mother   . Colon cancer Neg Hx     History   Social History  . Marital Status: Widowed    Spouse Name: N/A  . Number of Children: 4  . Years of Education: N/A   Occupational History  . retired    Social History Main Topics  . Smoking status: Former Smoker    Quit date: 01/05/1991  . Smokeless tobacco: Never Used  . Alcohol  Use: 2.4 oz/week    4 Glasses of wine per week     Comment: socially  . Drug Use: No  . Sexual Activity: Not on file   Other Topics Concern  . None   Social History Narrative   Widowed   HH of 1    No pets   Former smoker   Exercises regularly    Outpatient Prescriptions Prior to Visit  Medication Sig Dispense Refill  . aspirin 325 MG tablet Take 325 mg by mouth daily.      [name] azelastine (ASTELIN) 0.1 % nasal spray Place 2 sprays into both nostrils 2 (two) times daily. Use in each nostril as directed 30 mL 3  . ezetimibe (ZETIA) 10 MG tablet Take 1 tablet (10 mg total) by mouth daily. 30 tablet 0  . fexofenadine (ALLEGRA) 180 MG tablet Take 180 mg by mouth daily.    . fish oil-omega-3 fatty acids 1000 MG capsule Take 1 g by mouth daily.     [name] glucosamine-chondroitin 500-400 MG tablet Take 1 tablet by mouth 3 (three) times daily.     [name]  ibuprofen (ADVIL,MOTRIN) 200 MG tablet Take 200 mg by mouth every 6 (six) hours as needed. Patient used this medication for a headache.    . levothyroxine (SYNTHROID) 100 MCG tablet Take 1 tablet (100 mcg total) by mouth daily before breakfast. 90 tablet 0  . Melatonin 5 MG CAPS Take 1 capsule by mouth at bedtime as needed.    . MULTIPLE VITAMIN PO 50+ Senior Vitamin Daily    . nebivolol (BYSTOLIC) 2.5 MG tablet Take 1 tablet (2.5 mg total) by mouth daily. 90 tablet 3  . NON FORMULARY Calcium 600, Magnesium 300, D3 1000    . omeprazole (PRILOSEC) 40 MG capsule TAKE 1 CAPSULE DAILY 90 capsule 1  . Polyethyl Glycol-Propyl Glycol (SYSTANE OP) Apply to eye. Use 1-2 Drops in both eyes    . rosuvastatin (CRESTOR) 5 MG tablet Take 0.5 tablets (2.5 mg total) by mouth daily. 90 tablet 3  . SALINE NASAL SPRAY NA     . vitamin E 400 UNIT capsule Take 400 Units by mouth daily.    [name] ZETIA 10 MG tablet TAKE 1 TABLET DAILY 90 tablet 0  . diphenhydrAMINE (SOMINEX) 25 MG tablet Take 25 mg by mouth at bedtime as needed for sleep.     No facility-administered  medications prior to visit.     EXAM:  BP 120/62 mmHg  Pulse 68  Temp(Src) 98.6 F (37 C) (Oral)  Resp 18  Ht 5' 2.5" (1.588 m)  Wt 118 lb (53.524 kg)  BMI 21.22 kg/m2  SpO2 95%  Body mass index is 21.22 kg/(m^2). WDWN in NAD  quiet respirations; congested  somewhat hoarse. Non toxic . HEENT: Normocephalic ;atraumatic , Eyes;  PERRL, EOMs  Full, lids and conjunctiva clear,,Ears: no deformities, canals nl, TM landmarks normal, Nose: no deformity white discharge but congested;face minimally tender Mouth : OP clear without lesion or edema . Red  No lesion Neck: Supple without adenopathy or masses or bruits Chest:   bs = few muscila sounds left base  ? If = bs  CV:  S1-S2 no gallops or murmurs peripheral perfusion is normal Skin :nl perfusion and no acute rashes skin    ASSESSMENT AND PLAN:  Discussed the following assessment and plan:  Respiratory illness with fever - Plan: DG Chest 2 View  Protracted URI  Age factor Late fever som concern although low grade    c xray     Consider antibiotic if fever etc  rx given to patient trial  Tessalon perles  -Patient advised to return or notify health care team  if symptoms worsen ,persist or new concerns arise.  Patient Instructions  Will let you know about chest x ray results . Add antibiotic if we tell you to based on findings or if not getting better in next  few daysor having  with persistent fever . Continue fluids rest .    [name]. Panosh M.D.

## 2015-06-12 ENCOUNTER — Other Ambulatory Visit (INDEPENDENT_AMBULATORY_CARE_PROVIDER_SITE_OTHER): Payer: Medicare Other

## 2015-06-12 DIAGNOSIS — E039 Hypothyroidism, unspecified: Secondary | ICD-10-CM

## 2015-06-12 LAB — TSH: TSH: 1.99 u[IU]/mL (ref 0.35–4.50)

## 2015-06-13 ENCOUNTER — Other Ambulatory Visit: Payer: Self-pay | Admitting: Family Medicine

## 2015-06-13 DIAGNOSIS — E785 Hyperlipidemia, unspecified: Secondary | ICD-10-CM

## 2015-06-13 DIAGNOSIS — I10 Essential (primary) hypertension: Secondary | ICD-10-CM

## 2015-06-13 DIAGNOSIS — E039 Hypothyroidism, unspecified: Secondary | ICD-10-CM

## 2015-06-18 ENCOUNTER — Telehealth: Payer: Self-pay | Admitting: Internal Medicine

## 2015-06-18 NOTE — Telephone Encounter (Signed)
Pt was approved for Prolia.  Please schedule her on my injection schedule.  Thanks!

## 2015-06-18 NOTE — Telephone Encounter (Signed)
Pt states dr Regis Bill discussed her getting prolia injections, but pt has not heard anything yet. pls advise

## 2015-06-19 NOTE — Telephone Encounter (Signed)
Pt has ben scheduled.

## 2015-06-21 ENCOUNTER — Ambulatory Visit (INDEPENDENT_AMBULATORY_CARE_PROVIDER_SITE_OTHER): Payer: Medicare Other | Admitting: Family Medicine

## 2015-06-21 DIAGNOSIS — M810 Age-related osteoporosis without current pathological fracture: Secondary | ICD-10-CM | POA: Diagnosis not present

## 2015-06-21 MED ORDER — DENOSUMAB 60 MG/ML ~~LOC~~ SOLN
60.0000 mg | Freq: Once | SUBCUTANEOUS | Status: AC
  Administered 2015-06-21: 60 mg via SUBCUTANEOUS

## 2015-07-06 ENCOUNTER — Other Ambulatory Visit: Payer: Self-pay | Admitting: Internal Medicine

## 2015-07-06 NOTE — Telephone Encounter (Signed)
Sent to the pharmacy by e-scribe.  Pt has upcoming wellness on 11/20/15

## 2015-07-27 ENCOUNTER — Other Ambulatory Visit: Payer: Self-pay | Admitting: Internal Medicine

## 2015-07-27 NOTE — Telephone Encounter (Signed)
Sent to the pharmacy by e-scribe.  Pt has a cpx scheduled for 11/20/15

## 2015-08-13 ENCOUNTER — Other Ambulatory Visit: Payer: Self-pay | Admitting: Internal Medicine

## 2015-08-13 NOTE — Telephone Encounter (Signed)
Sent to the pharmacy by e-scribe. 

## 2015-08-16 DIAGNOSIS — H04123 Dry eye syndrome of bilateral lacrimal glands: Secondary | ICD-10-CM | POA: Diagnosis not present

## 2015-08-16 DIAGNOSIS — Z961 Presence of intraocular lens: Secondary | ICD-10-CM | POA: Diagnosis not present

## 2015-08-16 DIAGNOSIS — H532 Diplopia: Secondary | ICD-10-CM | POA: Diagnosis not present

## 2015-08-27 DIAGNOSIS — H04552 Acquired stenosis of left nasolacrimal duct: Secondary | ICD-10-CM | POA: Diagnosis not present

## 2015-08-27 DIAGNOSIS — H11432 Conjunctival hyperemia, left eye: Secondary | ICD-10-CM | POA: Diagnosis not present

## 2015-08-27 DIAGNOSIS — L905 Scar conditions and fibrosis of skin: Secondary | ICD-10-CM | POA: Diagnosis not present

## 2015-08-29 ENCOUNTER — Telehealth: Payer: Self-pay | Admitting: Internal Medicine

## 2015-08-29 NOTE — Telephone Encounter (Signed)
yes

## 2015-08-29 NOTE — Telephone Encounter (Signed)
Amy Fischer from [facility] [id number]) is seeing Dr. Isidoro Donning on 09/06/2015 for a TEAR DUCT SURGERY. Amy Fischer is needing a verbal authorization to see if or when can she stop taking her 325 asprin before surgery.

## 2015-08-30 NOTE — Telephone Encounter (Signed)
Tried reaching [facility].  Office must be closed for lunch.  Will try again at a later time.

## 2015-09-05 NOTE — Telephone Encounter (Signed)
Left a message for a return call.

## 2015-09-06 ENCOUNTER — Other Ambulatory Visit: Payer: Self-pay | Admitting: Ophthalmology

## 2015-09-06 DIAGNOSIS — H04202 Unspecified epiphora, left lacrimal gland: Secondary | ICD-10-CM | POA: Diagnosis not present

## 2015-09-06 DIAGNOSIS — H04552 Acquired stenosis of left nasolacrimal duct: Secondary | ICD-10-CM | POA: Diagnosis not present

## 2015-09-06 DIAGNOSIS — H04302 Unspecified dacryocystitis of left lacrimal passage: Secondary | ICD-10-CM | POA: Diagnosis not present

## 2015-09-06 DIAGNOSIS — H04222 Epiphora due to insufficient drainage, left lacrimal gland: Secondary | ICD-10-CM | POA: Diagnosis not present

## 2015-09-07 NOTE — Telephone Encounter (Signed)
Tried to return call.  Their office is closed.  Patient's surgery was scheduled for 09/06/15.  Will now close the note. Spoke to the pt.  She did have her surgery.  Doing well.

## 2015-09-24 DIAGNOSIS — H04222 Epiphora due to insufficient drainage, left lacrimal gland: Secondary | ICD-10-CM | POA: Diagnosis not present

## 2015-09-24 DIAGNOSIS — J3481 Nasal mucositis (ulcerative): Secondary | ICD-10-CM | POA: Diagnosis not present

## 2015-09-30 ENCOUNTER — Inpatient Hospital Stay (HOSPITAL_COMMUNITY): Payer: Medicare Other

## 2015-09-30 ENCOUNTER — Inpatient Hospital Stay (HOSPITAL_COMMUNITY)
Admission: EM | Admit: 2015-09-30 | Discharge: 2015-10-22 | DRG: 216 | Disposition: A | Discharge status: Home Health | Payer: Medicare Other | Attending: Thoracic Surgery (Cardiothoracic Vascular Surgery) | Admitting: Thoracic Surgery (Cardiothoracic Vascular Surgery)

## 2015-09-30 ENCOUNTER — Emergency Department (HOSPITAL_COMMUNITY): Payer: Medicare Other

## 2015-09-30 ENCOUNTER — Encounter (HOSPITAL_COMMUNITY): Payer: Self-pay

## 2015-09-30 DIAGNOSIS — E876 Hypokalemia: Secondary | ICD-10-CM | POA: Diagnosis not present

## 2015-09-30 DIAGNOSIS — D6959 Other secondary thrombocytopenia: Secondary | ICD-10-CM | POA: Diagnosis not present

## 2015-09-30 DIAGNOSIS — K219 Gastro-esophageal reflux disease without esophagitis: Secondary | ICD-10-CM | POA: Diagnosis present

## 2015-09-30 DIAGNOSIS — K224 Dyskinesia of esophagus: Secondary | ICD-10-CM | POA: Diagnosis present

## 2015-09-30 DIAGNOSIS — J9601 Acute respiratory failure with hypoxia: Secondary | ICD-10-CM | POA: Insufficient documentation

## 2015-09-30 DIAGNOSIS — I110 Hypertensive heart disease with heart failure: Secondary | ICD-10-CM | POA: Diagnosis present

## 2015-09-30 DIAGNOSIS — A047 Enterocolitis due to Clostridium difficile: Secondary | ICD-10-CM | POA: Diagnosis not present

## 2015-09-30 DIAGNOSIS — I2510 Atherosclerotic heart disease of native coronary artery without angina pectoris: Secondary | ICD-10-CM | POA: Diagnosis not present

## 2015-09-30 DIAGNOSIS — J984 Other disorders of lung: Secondary | ICD-10-CM | POA: Diagnosis not present

## 2015-09-30 DIAGNOSIS — I6521 Occlusion and stenosis of right carotid artery: Secondary | ICD-10-CM | POA: Diagnosis present

## 2015-09-30 DIAGNOSIS — A0472 Enterocolitis due to Clostridium difficile, not specified as recurrent: Secondary | ICD-10-CM

## 2015-09-30 DIAGNOSIS — I480 Paroxysmal atrial fibrillation: Secondary | ICD-10-CM

## 2015-09-30 DIAGNOSIS — D7582 Heparin induced thrombocytopenia (HIT): Secondary | ICD-10-CM | POA: Diagnosis not present

## 2015-09-30 DIAGNOSIS — I272 Other secondary pulmonary hypertension: Secondary | ICD-10-CM | POA: Diagnosis present

## 2015-09-30 DIAGNOSIS — J9602 Acute respiratory failure with hypercapnia: Secondary | ICD-10-CM | POA: Diagnosis not present

## 2015-09-30 DIAGNOSIS — Z87891 Personal history of nicotine dependence: Secondary | ICD-10-CM

## 2015-09-30 DIAGNOSIS — J90 Pleural effusion, not elsewhere classified: Secondary | ICD-10-CM | POA: Diagnosis not present

## 2015-09-30 DIAGNOSIS — Z79899 Other long term (current) drug therapy: Secondary | ICD-10-CM

## 2015-09-30 DIAGNOSIS — Z01818 Encounter for other preprocedural examination: Secondary | ICD-10-CM

## 2015-09-30 DIAGNOSIS — I340 Nonrheumatic mitral (valve) insufficiency: Secondary | ICD-10-CM | POA: Diagnosis not present

## 2015-09-30 DIAGNOSIS — M25552 Pain in left hip: Secondary | ICD-10-CM | POA: Insufficient documentation

## 2015-09-30 DIAGNOSIS — E538 Deficiency of other specified B group vitamins: Secondary | ICD-10-CM | POA: Diagnosis present

## 2015-09-30 DIAGNOSIS — R0600 Dyspnea, unspecified: Secondary | ICD-10-CM | POA: Diagnosis not present

## 2015-09-30 DIAGNOSIS — D649 Anemia, unspecified: Secondary | ICD-10-CM | POA: Diagnosis not present

## 2015-09-30 DIAGNOSIS — I509 Heart failure, unspecified: Secondary | ICD-10-CM | POA: Insufficient documentation

## 2015-09-30 DIAGNOSIS — I10 Essential (primary) hypertension: Secondary | ICD-10-CM | POA: Diagnosis not present

## 2015-09-30 DIAGNOSIS — R0602 Shortness of breath: Secondary | ICD-10-CM | POA: Diagnosis not present

## 2015-09-30 DIAGNOSIS — Z4682 Encounter for fitting and adjustment of non-vascular catheter: Secondary | ICD-10-CM | POA: Diagnosis not present

## 2015-09-30 DIAGNOSIS — Z6825 Body mass index (BMI) 25.0-25.9, adult: Secondary | ICD-10-CM

## 2015-09-30 DIAGNOSIS — E785 Hyperlipidemia, unspecified: Secondary | ICD-10-CM | POA: Diagnosis present

## 2015-09-30 DIAGNOSIS — D72829 Elevated white blood cell count, unspecified: Secondary | ICD-10-CM | POA: Diagnosis not present

## 2015-09-30 DIAGNOSIS — R0603 Acute respiratory distress: Secondary | ICD-10-CM

## 2015-09-30 DIAGNOSIS — R5381 Other malaise: Secondary | ICD-10-CM | POA: Diagnosis not present

## 2015-09-30 DIAGNOSIS — I4891 Unspecified atrial fibrillation: Secondary | ICD-10-CM | POA: Diagnosis not present

## 2015-09-30 DIAGNOSIS — I5033 Acute on chronic diastolic (congestive) heart failure: Secondary | ICD-10-CM | POA: Diagnosis present

## 2015-09-30 DIAGNOSIS — D62 Acute posthemorrhagic anemia: Secondary | ICD-10-CM | POA: Diagnosis not present

## 2015-09-30 DIAGNOSIS — Z7982 Long term (current) use of aspirin: Secondary | ICD-10-CM | POA: Diagnosis not present

## 2015-09-30 DIAGNOSIS — J9811 Atelectasis: Secondary | ICD-10-CM | POA: Diagnosis not present

## 2015-09-30 DIAGNOSIS — E441 Mild protein-calorie malnutrition: Secondary | ICD-10-CM | POA: Diagnosis present

## 2015-09-30 DIAGNOSIS — E539 Vitamin B deficiency, unspecified: Secondary | ICD-10-CM | POA: Diagnosis not present

## 2015-09-30 DIAGNOSIS — E038 Other specified hypothyroidism: Secondary | ICD-10-CM | POA: Diagnosis not present

## 2015-09-30 DIAGNOSIS — I248 Other forms of acute ischemic heart disease: Secondary | ICD-10-CM | POA: Diagnosis present

## 2015-09-30 DIAGNOSIS — Z01810 Encounter for preprocedural cardiovascular examination: Secondary | ICD-10-CM | POA: Diagnosis not present

## 2015-09-30 DIAGNOSIS — Z9889 Other specified postprocedural states: Secondary | ICD-10-CM | POA: Diagnosis not present

## 2015-09-30 DIAGNOSIS — I459 Conduction disorder, unspecified: Secondary | ICD-10-CM | POA: Diagnosis not present

## 2015-09-30 DIAGNOSIS — D696 Thrombocytopenia, unspecified: Secondary | ICD-10-CM

## 2015-09-30 DIAGNOSIS — I5031 Acute diastolic (congestive) heart failure: Secondary | ICD-10-CM | POA: Diagnosis not present

## 2015-09-30 DIAGNOSIS — J449 Chronic obstructive pulmonary disease, unspecified: Secondary | ICD-10-CM | POA: Diagnosis present

## 2015-09-30 DIAGNOSIS — D72820 Lymphocytosis (symptomatic): Secondary | ICD-10-CM | POA: Diagnosis not present

## 2015-09-30 DIAGNOSIS — E039 Hypothyroidism, unspecified: Secondary | ICD-10-CM | POA: Diagnosis present

## 2015-09-30 LAB — I-STAT ARTERIAL BLOOD GAS, ED
Acid-base deficit: 10 mmol/L — ABNORMAL HIGH (ref 0.0–2.0)
Acid-base deficit: 4 mmol/L — ABNORMAL HIGH (ref 0.0–2.0)
BICARBONATE: 19.3 meq/L — AB (ref 20.0–24.0)
BICARBONATE: 22.4 meq/L (ref 20.0–24.0)
O2 SAT: 100 %
O2 Saturation: 97 %
PCO2 ART: 53.9 mmHg — AB (ref 35.0–45.0)
PCO2 ART: 43.9 mmHg (ref 35.0–45.0)
PH ART: 7.311 — AB (ref 7.350–7.450)
PO2 ART: 254 mmHg — AB (ref 80.0–100.0)
Patient temperature: 96.8
TCO2: 21 mmol/L (ref 0–100)
TCO2: 24 mmol/L (ref 0–100)
pH, Arterial: 7.162 — CL (ref 7.350–7.450)
pO2, Arterial: 118 mmHg — ABNORMAL HIGH (ref 80.0–100.0)

## 2015-09-30 LAB — BASIC METABOLIC PANEL
Anion gap: 15 (ref 5–15)
BUN: 15 mg/dL (ref 6–20)
CALCIUM: 9 mg/dL (ref 8.9–10.3)
CO2: 16 mmol/L — AB (ref 22–32)
CREATININE: 1.08 mg/dL — AB (ref 0.44–1.00)
Chloride: 106 mmol/L (ref 101–111)
GFR calc Af Amer: 53 mL/min — ABNORMAL LOW (ref >60)
GFR calc non Af Amer: 46 mL/min — ABNORMAL LOW (ref >60)
GLUCOSE: 328 mg/dL — AB (ref 65–99)
Potassium: 3.9 mmol/L (ref 3.5–5.1)
Sodium: 137 mmol/L (ref 135–145)

## 2015-09-30 LAB — HEPATIC FUNCTION PANEL
ALBUMIN: 3.2 g/dL — AB (ref 3.5–5.0)
ALT: 20 U/L (ref 14–54)
AST: 30 U/L (ref 15–41)
Alkaline Phosphatase: 57 U/L (ref 38–126)
BILIRUBIN TOTAL: 0.5 mg/dL (ref 0.3–1.2)
Total Protein: 5.8 g/dL — ABNORMAL LOW (ref 6.5–8.1)

## 2015-09-30 LAB — CBC
HEMATOCRIT: 40.9 % (ref 36.0–46.0)
HEMATOCRIT: 41.2 % (ref 36.0–46.0)
HEMOGLOBIN: 13.5 g/dL (ref 12.0–15.0)
HEMOGLOBIN: 13.5 g/dL (ref 12.0–15.0)
MCH: 31.2 pg (ref 26.0–34.0)
MCH: 31 pg (ref 26.0–34.0)
MCHC: 33 g/dL (ref 30.0–36.0)
MCHC: 32.8 g/dL (ref 30.0–36.0)
MCV: 94.5 fL (ref 78.0–100.0)
MCV: 94.7 fL (ref 78.0–100.0)
Platelets: 203 10*3/uL (ref 150–400)
Platelets: 208 10*3/uL (ref 150–400)
RBC: 4.35 MIL/uL (ref 3.87–5.11)
RBC: 4.33 MIL/uL (ref 3.87–5.11)
RDW: 13.8 % (ref 11.5–15.5)
RDW: 13.8 % (ref 11.5–15.5)
WBC: 10.1 10*3/uL (ref 4.0–10.5)
WBC: 10.3 10*3/uL (ref 4.0–10.5)

## 2015-09-30 LAB — CBC WITH DIFFERENTIAL/PLATELET
Basophils Absolute: 0 10*3/uL (ref 0.0–0.1)
Basophils Relative: 0 %
EOS PCT: 4 %
Eosinophils Absolute: 0.6 10*3/uL (ref 0.0–0.7)
HEMATOCRIT: 44.6 % (ref 36.0–46.0)
Hemoglobin: 14.2 g/dL (ref 12.0–15.0)
LYMPHS ABS: 7.6 10*3/uL — AB (ref 0.7–4.0)
Lymphocytes Relative: 54 %
MCH: 31.1 pg (ref 26.0–34.0)
MCHC: 31.8 g/dL (ref 30.0–36.0)
MCV: 97.8 fL (ref 78.0–100.0)
MONO ABS: 1 10*3/uL (ref 0.1–1.0)
MONOS PCT: 7 %
NEUTROS ABS: 5 10*3/uL (ref 1.7–7.7)
Neutrophils Relative %: 35 %
PLATELETS: 246 10*3/uL (ref 150–400)
RBC: 4.56 MIL/uL (ref 3.87–5.11)
RDW: 13.9 % (ref 11.5–15.5)
WBC: 14.2 10*3/uL — ABNORMAL HIGH (ref 4.0–10.5)

## 2015-09-30 LAB — URINALYSIS, ROUTINE W REFLEX MICROSCOPIC
Bilirubin Urine: NEGATIVE
GLUCOSE, UA: NEGATIVE mg/dL
Hgb urine dipstick: NEGATIVE
KETONES UR: NEGATIVE mg/dL
LEUKOCYTES UA: NEGATIVE
NITRITE: NEGATIVE
PH: 5.5 (ref 5.0–8.0)
Protein, ur: NEGATIVE mg/dL
Specific Gravity, Urine: 1.015 (ref 1.005–1.030)
Urobilinogen, UA: 0.2 mg/dL (ref 0.0–1.0)

## 2015-09-30 LAB — CREATININE, SERUM
CREATININE: 0.79 mg/dL (ref 0.44–1.00)
Creatinine, Ser: 0.82 mg/dL (ref 0.44–1.00)
GFR calc Af Amer: >60 mL/min (ref >60)
GFR calc non Af Amer: >60 mL/min (ref >60)

## 2015-09-30 LAB — BLOOD GAS, ARTERIAL
ACID-BASE DEFICIT: 1.3 mmol/L (ref 0.0–2.0)
BICARBONATE: 22.4 meq/L (ref 20.0–24.0)
Drawn by: 44589
O2 CONTENT: 5 L/min
O2 Saturation: 98.6 %
PATIENT TEMPERATURE: 98.6
PCO2 ART: 34.4 mmHg — AB (ref 35.0–45.0)
PO2 ART: 121 mmHg — AB (ref 80.0–100.0)
TCO2: 23.4 mmol/L (ref 0–100)
pH, Arterial: 7.429 (ref 7.350–7.450)

## 2015-09-30 LAB — TROPONIN I
TROPONIN I: 0.04 ng/mL — AB (ref <0.031)
Troponin I: <0.03 ng/mL (ref <0.031)
Troponin I: 0.08 ng/mL — ABNORMAL HIGH (ref <0.031)
Troponin I: 0.07 ng/mL — ABNORMAL HIGH (ref <0.031)

## 2015-09-30 LAB — MAGNESIUM: Magnesium: 2.1 mg/dL (ref 1.7–2.4)

## 2015-09-30 LAB — TSH: TSH: 0.586 u[IU]/mL (ref 0.350–4.500)

## 2015-09-30 LAB — MRSA PCR SCREENING: MRSA by PCR: NEGATIVE

## 2015-09-30 LAB — CORTISOL: CORTISOL PLASMA: 18.6 ug/dL

## 2015-09-30 LAB — BRAIN NATRIURETIC PEPTIDE: B Natriuretic Peptide: 460.6 pg/mL — ABNORMAL HIGH (ref 0.0–100.0)

## 2015-09-30 MED ORDER — ROSUVASTATIN CALCIUM 5 MG PO TABS
2.5000 mg | ORAL_TABLET | Freq: Every day | ORAL | Status: DC
  Administered 2015-09-30 – 2015-10-22 (×20): 2.5 mg via ORAL
  Filled 2015-09-30 (×24): qty 0.5

## 2015-09-30 MED ORDER — ROCURONIUM BROMIDE 50 MG/5ML IV SOLN
INTRAVENOUS | Status: DC
Start: 2015-09-30 — End: 2015-09-30
  Filled 2015-09-30: qty 2

## 2015-09-30 MED ORDER — ONDANSETRON HCL 4 MG/2ML IJ SOLN: 4.0000 mg | Freq: Four times a day (QID) | INTRAMUSCULAR | Status: DC | PRN

## 2015-09-30 MED ORDER — IOHEXOL 350 MG/ML SOLN
60.0000 mL | Freq: Once | INTRAVENOUS | Status: AC | PRN
  Administered 2015-09-30: 60 mL via INTRAVENOUS

## 2015-09-30 MED ORDER — LIDOCAINE HCL (CARDIAC) 20 MG/ML IV SOLN
INTRAVENOUS | Status: AC
  Filled 2015-09-30: qty 5

## 2015-09-30 MED ORDER — SODIUM CHLORIDE 0.9 % IJ SOLN
3.0000 mL | Freq: Two times a day (BID) | INTRAMUSCULAR | Status: DC
Start: 2015-09-30 — End: 2015-10-03
  Administered 2015-09-30: 10 mL via INTRAVENOUS
  Administered 2015-09-30: 16:00:00 via INTRAVENOUS
  Administered 2015-10-01 – 2015-10-03 (×6): 3 mL via INTRAVENOUS

## 2015-09-30 MED ORDER — LEVOTHYROXINE SODIUM 100 MCG PO TABS
100.0000 ug | ORAL_TABLET | Freq: Every day | ORAL | Status: DC
  Administered 2015-09-30 – 2015-10-08 (×9): 100 ug via ORAL
  Filled 2015-09-30 (×9): qty 1

## 2015-09-30 MED ORDER — DEXTROSE 5 % IV SOLN
1.0000 g | Freq: Once | INTRAVENOUS | Status: AC
  Administered 2015-09-30: 1 g via INTRAVENOUS
  Filled 2015-09-30: qty 10

## 2015-09-30 MED ORDER — SUCCINYLCHOLINE CHLORIDE 20 MG/ML IJ SOLN
INTRAMUSCULAR | Status: AC
  Filled 2015-09-30: qty 1

## 2015-09-30 MED ORDER — SODIUM CHLORIDE 0.9 % IV BOLUS (SEPSIS)
250.0000 mL | Freq: Once | INTRAVENOUS | Status: AC
  Administered 2015-09-30: 250 mL via INTRAVENOUS

## 2015-09-30 MED ORDER — SODIUM CHLORIDE 0.9 % IJ SOLN
3.0000 mL | Freq: Two times a day (BID) | INTRAMUSCULAR | Status: DC
  Administered 2015-09-30 – 2015-10-02 (×3): 3 mL via INTRAVENOUS

## 2015-09-30 MED ORDER — ACETAMINOPHEN 650 MG RE SUPP: 650.0000 mg | Freq: Four times a day (QID) | RECTAL | Status: DC | PRN

## 2015-09-30 MED ORDER — ETOMIDATE 2 MG/ML IV SOLN
INTRAVENOUS | Status: AC
  Filled 2015-09-30: qty 20

## 2015-09-30 MED ORDER — ONDANSETRON HCL 4 MG/2ML IJ SOLN
4.0000 mg | Freq: Once | INTRAMUSCULAR | Status: AC
  Administered 2015-09-30: 4 mg via INTRAVENOUS
  Filled 2015-09-30: qty 2

## 2015-09-30 MED ORDER — PANTOPRAZOLE SODIUM 40 MG PO TBEC
40.0000 mg | DELAYED_RELEASE_TABLET | Freq: Every day | ORAL | Status: DC
  Administered 2015-09-30 – 2015-10-09 (×10): 40 mg via ORAL
  Filled 2015-09-30 (×11): qty 1

## 2015-09-30 MED ORDER — DIPHENHYDRAMINE HCL 25 MG PO CAPS
25.0000 mg | ORAL_CAPSULE | Freq: Once | ORAL | Status: AC
  Administered 2015-10-01: 25 mg via ORAL
  Filled 2015-09-30: qty 1

## 2015-09-30 MED ORDER — CETYLPYRIDINIUM CHLORIDE 0.05 % MT LIQD
7.0000 mL | Freq: Two times a day (BID) | OROMUCOSAL | Status: DC
  Administered 2015-09-30 – 2015-10-01 (×3): 7 mL via OROMUCOSAL

## 2015-09-30 MED ORDER — DEXTROSE 5 % IV SOLN
500.0000 mg | Freq: Once | INTRAVENOUS | Status: AC
  Administered 2015-09-30: 500 mg via INTRAVENOUS
  Filled 2015-09-30: qty 500

## 2015-09-30 MED ORDER — HEPARIN SODIUM (PORCINE) 5000 UNIT/ML IJ SOLN: 5000.0000 [IU] | Freq: Three times a day (TID) | INTRAMUSCULAR | Status: DC

## 2015-09-30 MED ORDER — ONDANSETRON HCL 4 MG PO TABS: 4.0000 mg | ORAL_TABLET | Freq: Four times a day (QID) | ORAL | Status: DC | PRN

## 2015-09-30 MED ORDER — NEBIVOLOL HCL 2.5 MG PO TABS
2.5000 mg | ORAL_TABLET | Freq: Every day | ORAL | Status: DC
  Administered 2015-09-30 – 2015-10-09 (×7): 2.5 mg via ORAL
  Filled 2015-09-30 (×11): qty 1

## 2015-09-30 MED ORDER — FUROSEMIDE 10 MG/ML IJ SOLN
20.0000 mg | Freq: Two times a day (BID) | INTRAMUSCULAR | Status: AC
  Administered 2015-10-01 – 2015-10-03 (×5): 20 mg via INTRAVENOUS
  Filled 2015-09-30 (×6): qty 2

## 2015-09-30 MED ORDER — ENOXAPARIN SODIUM 40 MG/0.4ML ~~LOC~~ SOLN
40.0000 mg | SUBCUTANEOUS | Status: DC
  Administered 2015-09-30 – 2015-10-02 (×3): 40 mg via SUBCUTANEOUS
  Filled 2015-09-30 (×4): qty 0.4

## 2015-09-30 MED ORDER — ACETAMINOPHEN 325 MG PO TABS
650.0000 mg | ORAL_TABLET | Freq: Four times a day (QID) | ORAL | Status: DC | PRN
  Administered 2015-10-01 – 2015-10-03 (×5): 650 mg via ORAL
  Filled 2015-09-30 (×5): qty 2

## 2015-09-30 MED ORDER — PIPERACILLIN-TAZOBACTAM 3.375 G IVPB
3.3750 g | Freq: Three times a day (TID) | INTRAVENOUS | Status: DC
  Administered 2015-09-30 – 2015-10-01 (×4): 3.375 g via INTRAVENOUS
  Filled 2015-09-30 (×6): qty 50

## 2015-09-30 MED ORDER — VANCOMYCIN HCL IN DEXTROSE 750-5 MG/150ML-% IV SOLN
750.0000 mg | INTRAVENOUS | Status: DC
  Administered 2015-10-01: 750 mg via INTRAVENOUS
  Filled 2015-09-30: qty 150

## 2015-09-30 MED ORDER — VANCOMYCIN HCL IN DEXTROSE 1-5 GM/200ML-% IV SOLN
1000.0000 mg | Freq: Once | INTRAVENOUS | Status: AC
  Administered 2015-09-30: 1000 mg via INTRAVENOUS
  Filled 2015-09-30: qty 200

## 2015-09-30 MED ORDER — BENZONATATE 100 MG PO CAPS: 100.0000 mg | ORAL_CAPSULE | Freq: Three times a day (TID) | ORAL | Status: DC | PRN

## 2015-09-30 MED ORDER — EZETIMIBE 10 MG PO TABS: 10.0000 mg | ORAL_TABLET | Freq: Every day | ORAL | Status: DC

## 2015-09-30 NOTE — Consult Note (Signed)
ANTIBIOTIC CONSULT NOTE - INITIAL  Pharmacy Consult for vancomycin and Zosyn Indication: rule out sepsis  Allergies  Allergen Reactions  . Augmentin [Amoxicillin-Pot Clavulanate] Diarrhea    Not allergic  2014  . Codeine Nausea And Vomiting  . Risedronate Sodium     unknown  . Statins     Muscles hurt     Patient Measurements: Weight: 115 lb (52.164 kg)   Vital Signs: Temp: 96.8 F (36 C) (10/02 0409) Temp Source: Temporal (10/02 0409) BP: 123/63 mmHg (10/02 0850) Pulse Rate: 75 (10/02 0924) Intake/Output from previous day: 10/01 0701 - 10/02 0700 In: 250 [I.V.:250] Out: -  Intake/Output from this shift:    Labs:  Recent Labs  09/30/15 0335  WBC 14.2*  HGB 14.2  PLT 246  CREATININE 1.08*   Estimated Creatinine Clearance: 31.4 mL/min (by C-G formula based on Cr of 1.08). No results for input(s): VANCOTROUGH, VANCOPEAK, VANCORANDOM, GENTTROUGH, GENTPEAK, GENTRANDOM, TOBRATROUGH, TOBRAPEAK, TOBRARND, AMIKACINPEAK, AMIKACINTROU, AMIKACIN in the last 72 hours.   Microbiology: Recent Results (from the past 720 hour(s))  Blood culture (routine x 2)     Status: None (Preliminary result)   Collection Time: 09/30/15  6:53 AM  Result Value Ref Range Status   Specimen Description BLOOD RIGHT ANTECUBITAL  Final   Special Requests BOTTLES DRAWN AEROBIC AND ANAEROBIC 5CC  Final   Culture PENDING  Incomplete   Report Status PENDING  Incomplete    Medical History: Past Medical History  Diagnosis Date  . GERD (gastroesophageal reflux disease)   . Hyperlipidemia   . Hypothyroidism   . Osteoarthritis   . Osteoporosis   . Episodic recurrent vertigo     MRI Head 2003  . Allergy   . Bladder polyps   . Subclavian steal syndrome     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; CTA 2013 revealed open graft  . Hyperplastic colon polyp   . Esophageal spasm   . History of hepatitis     unknown type  . Asymptomatic carotid artery stenosis     R ICA 40% stenosis on CTA  12/2011.  [name] Anemia   . Cataract     BILATERAL-REMOVED  . Elevated blood pressure 03/25/2011    Bp readings borderline today has hx of elvation  In office and ok at home   Not checke recently   She gfeels was elevated from anxiety    . Diverticulosis   . Intestinal metaplasia of gastric mucosa   . Hepatic hemangioma     Medications:  Prescriptions prior to admission  Medication Sig Dispense Refill Last Dose  . aspirin 325 MG tablet Take 325 mg by mouth daily.     Taking  . azelastine (ASTELIN) 0.1 % nasal spray Place 2 sprays into both nostrils 2 (two) times daily. Use in each nostril as directed 30 mL 3 Taking  . benzonatate (TESSALON) 100 MG capsule Take 1 capsule (100 mg total) by mouth 3 (three) times daily as needed for cough. 21 capsule 1   . CRESTOR 5 MG tablet TAKE ONE-HALF (1/2) TABLET DAILY 90 tablet 0   . doxycycline (VIBRA-TABS) 100 MG tablet Take 1 tablet (100 mg total) by mouth 2 (two) times daily. 14 tablet 0   . ezetimibe (ZETIA) 10 MG tablet Take 1 tablet (10 mg total) by mouth daily. 30 tablet 0 Taking  . fexofenadine (ALLEGRA) 180 MG tablet Take 180 mg by mouth daily.   Taking  . fish oil-omega-3 fatty acids 1000 MG capsule  Take 1 g by mouth daily.    Taking  . glucosamine-chondroitin 500-400 MG tablet Take 1 tablet by mouth 3 (three) times daily.    Taking  . ibuprofen (ADVIL,MOTRIN) 200 MG tablet Take 200 mg by mouth every 6 (six) hours as needed. Patient used this medication for a headache.   Taking  . levothyroxine (SYNTHROID, LEVOTHROID) 100 MCG tablet TAKE 1 TABLET (100 MCG TOTAL) BY MOUTH DAILY BEFORE BREAKFAST. 90 tablet 1   . Melatonin 5 MG CAPS Take 1 capsule by mouth at bedtime as needed.   Taking  . MULTIPLE VITAMIN PO 50+ Senior Vitamin Daily   Taking  . nebivolol (BYSTOLIC) 2.5 MG tablet Take 1 tablet (2.5 mg total) by mouth daily. 90 tablet 3 Taking  . NON FORMULARY Calcium 600, Magnesium 300, D3 1000   Taking  . omeprazole (PRILOSEC) 40 MG capsule TAKE 1  CAPSULE DAILY 90 capsule 1 Taking  . Polyethyl Glycol-Propyl Glycol (SYSTANE OP) Apply to eye. Use 1-2 Drops in both eyes   Taking  . SALINE NASAL SPRAY NA    Taking  . vitamin E 400 UNIT capsule Take 400 Units by mouth daily.   Taking  . ZETIA 10 MG tablet TAKE 1 TABLET DAILY 90 tablet 1    Assessment: 81 yo woman admitted 09/30/2015 for respiratory distress and initial BP >200/100, tachycardic to 130s. Improved on bipap in ED. CXR consistent w/ pulmonary edema. AFib on initial EKG, however returned to SR on repeat after improvement of hypoxia. Pharmacy consulted to dose vancomycin and Zosyn for possible underlying infection/sepsis.  PMH GERD, HLD, hypothyroidism, OA, osteporosis, asymptomatic carotid artery stenosis (R ICA 40% 12/2011), anemia, AFib, HTN, PVD  ID Day #1 vanc/Zosyn for sepsis Wbc 14.2, afeb, Cr 1.08  10/02 ceftriaxone>> 10/02 x 1 dose 10/02 azithromycin>> 10/02 x 1 dose 10/02 vanc>>  10/02 Zosyn>>  10/02 blood>>    Goal of Therapy:  Vancomycin trough level 15-20 mcg/ml  Plan:  Zosyn 3.375g IV q8h extended infusion Vancomycin 1 g IV x 1 loading dose today @ 1100 Vancomycin 750 mg q24h maintenance dose starting tomorrow @ 1100 Monitor CBC, renal function, signs clinical improvement F/u cx Vancomycin trough once at steady state   Heloise Ochoa, [place].D. PGY2 Pharmacy Resident Pager: [phone] 09/30/2015,10:02 AM

## 2015-09-30 NOTE — ED Notes (Signed)
Placed back on Bipap by RT

## 2015-09-30 NOTE — Progress Notes (Signed)
Critical ABG results given to Dr. Bartholome Bill. ABG to be repeated in 30 minutes to allow more time on BIPAP.

## 2015-09-30 NOTE — Progress Notes (Signed)
RN called saying the patient was has a difficult time breathing and wanted to go back on BIPAP. RT returned patient to BIPAP. Pt resting @ this time.

## 2015-09-30 NOTE — ED Notes (Signed)
Dr. Doy Mince made aware of BP's on pt and wean her off of pressure support. Will make RT aware.

## 2015-09-30 NOTE — Progress Notes (Signed)
RN called to stated MD wanted to trail patient off BIPAP and to [place]. Placed on 3L [place]. Sats 96%. RT will monitor.

## 2015-09-30 NOTE — ED Notes (Addendum)
Pt decreased to 50% FiO2 on Bipap by RT

## 2015-09-30 NOTE — ED Notes (Signed)
RT called to adjust Bipap and begin weaning pt off.

## 2015-09-30 NOTE — Progress Notes (Signed)
Holding Note  81 year old woman with stage I hypertension, hypothyroidism, and peripheral vascular disease who who up tonight with acute dyspnea.  EMS was called who found her BP >200/100 mmHg and started Bipap.  Apparently fluid overloaded on exam and hypercarbic.  Appeared in ED mottled, lethargic but responded to Bipap alone and is now stabilized.  BNP elevated. Furosemide held by ED for soft pressures.  Leukocytosis, so antibiotics empirically started.    Will get CT chest angiography and then stay on Bipap and go to step-down.

## 2015-09-30 NOTE — Progress Notes (Signed)
Pt remains off BIPAP on 4L [place] tolerating well. SPO2 100%, BBS = clear. No distress noted at this time

## 2015-09-30 NOTE — Progress Notes (Signed)
Pt arrived by EMS on CPAP 10cm. Pt switched to BIPAP: IPAP: 18; EPAP: 8; RR: 15; FiO2: 100%. Pt shows visible signs of SOB and labored WOB. BIPAP initiated immediately

## 2015-09-30 NOTE — ED Provider Notes (Signed)
CSN: [id number]     Arrival date & time 09/30/15  4580 History  By signing my name below, I, Emmanuella Mensah, attest that this documentation has been prepared under the direction and in the presence of Serita Grit, MD. Electronically Signed: Judithann Sauger, ED Scribe. 09/30/2015. 3:57 AM.   Chief Complaint  Patient presents with  . Respiratory Distress   The history is provided by the patient. No language interpreter was used.   HPI Comments: Amy Fischer is a 81 y.o. female brought in by ambulance, who presents to the Emergency Department with respiratory distress onset PTA. Per EMS, pt was woken up by respiratory distress and called 911 where EMS had to used forced entry to get to pt. Pt was then immediately placed on C-PAP after given 1 NTG sublingually but O2 stats that not been above 80%. Pt has A-fib and told EMS that this is the first time she has experienced these symptoms. She is not currently able to speak but will nod or shake hand at questions.  She denies having an inhaler at home or using fluid pills. She also denies any heart problems.   Level V caveat applies secondary to severe respiratory distress.  Past Medical History  Diagnosis Date  . GERD (gastroesophageal reflux disease)   . Hyperlipidemia   . Hypothyroidism   . Osteoarthritis   . Osteoporosis   . Episodic recurrent vertigo     MRI Head 2003  . Allergy   . Bladder polyps   . Subclavian steal syndrome     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; CTA 2013 revealed open graft  . Hyperplastic colon polyp   . Esophageal spasm   . History of hepatitis     unknown type  . Asymptomatic carotid artery stenosis     R ICA 40% stenosis on CTA 12/2011.  [name] Anemia   . Cataract     BILATERAL-REMOVED  . Elevated blood pressure 03/25/2011    Bp readings borderline today has hx of elvation  In office and ok at home   Not checke recently   She gfeels was elevated from anxiety    . Diverticulosis   . Intestinal  metaplasia of gastric mucosa   . Hepatic hemangioma    Past Surgical History  Procedure Laterality Date  . Appendectomy    . Cholecystectomy    . Carotid-subclavian bypass graft Left 1999    for [place] steal syndrome  . Rotator cuff repair Left   . Elbow surgery Left   . Cystectomy Left     hand  . Tubal ligation    . Tonsillectomy    . Colonoscopy    . Tear duct probing  07/2014   Family History  Problem Relation Age of Onset  . Hypertension Mother   . Heart disease Father   . Stroke Mother   . Colon cancer Neg Hx    Social History  Substance Use Topics  . Smoking status: Former Smoker    Quit date: 01/05/1991  . Smokeless tobacco: Never Used  . Alcohol Use: 2.4 oz/week    4 Glasses of wine per week     Comment: socially   OB History    No data available     Review of Systems  Unable to perform ROS: Severe respiratory distress      Allergies  Augmentin; Codeine; Risedronate sodium; and Statins  Home Medications   Prior to Admission medications   Medication Sig Start Date End  Date Taking? Authorizing Provider  aspirin 325 MG tablet Take 325 mg by mouth daily.      Historical Provider, MD  azelastine (ASTELIN) 0.1 % nasal spray Place 2 sprays into both nostrils 2 (two) times daily. Use in each nostril as directed 07/26/14   Burnis Medin, MD  benzonatate (TESSALON) 100 MG capsule Take 1 capsule (100 mg total) by mouth 3 (three) times daily as needed for cough. 05/14/15   Burnis Medin, MD  CRESTOR 5 MG tablet TAKE ONE-HALF (1/2) TABLET DAILY 08/13/15   Burnis Medin, MD  doxycycline (VIBRA-TABS) 100 MG tablet Take 1 tablet (100 mg total) by mouth 2 (two) times daily. 05/14/15   Burnis Medin, MD  ezetimibe (ZETIA) 10 MG tablet Take 1 tablet (10 mg total) by mouth daily. 02/05/15   Burnis Medin, MD  fexofenadine (ALLEGRA) 180 MG tablet Take 180 mg by mouth daily.    Historical Provider, MD  fish oil-omega-3 fatty acids 1000 MG capsule Take 1 g by mouth daily.      Historical Provider, MD  glucosamine-chondroitin 500-400 MG tablet Take 1 tablet by mouth 3 (three) times daily.     Historical Provider, MD  ibuprofen (ADVIL,MOTRIN) 200 MG tablet Take 200 mg by mouth every 6 (six) hours as needed. Patient used this medication for a headache.    Historical Provider, MD  levothyroxine (SYNTHROID, LEVOTHROID) 100 MCG tablet TAKE 1 TABLET (100 MCG TOTAL) BY MOUTH DAILY BEFORE BREAKFAST. 07/27/15   Burnis Medin, MD  Melatonin 5 MG CAPS Take 1 capsule by mouth at bedtime as needed.    Historical Provider, MD  MULTIPLE VITAMIN PO 50+ Senior Vitamin Daily    Historical Provider, MD  nebivolol (BYSTOLIC) 2.5 MG tablet Take 1 tablet (2.5 mg total) by mouth daily. 07/26/14   Burnis Medin, MD  NON FORMULARY Calcium 600, Magnesium 300, D3 1000    Historical Provider, MD  omeprazole (PRILOSEC) 40 MG capsule TAKE 1 CAPSULE DAILY 02/08/15   Burnis Medin, MD  Polyethyl Glycol-Propyl Glycol (SYSTANE OP) Apply to eye. Use 1-2 Drops in both eyes    Historical Provider, MD  SALINE NASAL SPRAY NA     Historical Provider, MD  vitamin E 400 UNIT capsule Take 400 Units by mouth daily.    Historical Provider, MD  ZETIA 10 MG tablet TAKE 1 TABLET DAILY 07/06/15   Burnis Medin, MD   BP 186/86 mmHg  Pulse 131  Resp 33  Wt 115 lb (52.164 kg)  SpO2 95% Physical Exam  Constitutional: She is oriented to person, place, and time. She appears well-developed and well-nourished. She appears distressed (respiratory distress).  Mottled, central cyanosis  HENT:  Head: Normocephalic and atraumatic.  Eyes: Conjunctivae are normal. Pupils are equal, round, and reactive to light. No scleral icterus.  Neck: Neck supple.  Cardiovascular: Normal heart sounds and intact distal pulses.  An irregularly irregular rhythm present. Tachycardia present.   No murmur heard. Pulmonary/Chest: Effort normal. No stridor. No respiratory distress. She has rales (diffuse rales).  Abdominal: Soft. Bowel sounds are  normal. She exhibits no distension. There is no tenderness.  Musculoskeletal: Normal range of motion.  Neurological: She is alert and oriented to person, place, and time.  Lethargic  Skin: Skin is warm. No rash noted. She is diaphoretic.  Psychiatric: She has a normal mood and affect. Her behavior is normal.  Nursing note and vitals reviewed.   ED Course  CRITICAL CARE Performed by:  Niyana Chesbro Authorized by: Serita Grit Total critical care time: 40 minutes Critical care time was exclusive of separately billable procedures and treating other patients. Critical care was necessary to treat or prevent imminent or life-threatening deterioration of the following conditions: respiratory failure. Critical care was time spent personally by me on the following activities: development of treatment plan with patient or surrogate, discussions with consultants, evaluation of patient's response to treatment, examination of patient, obtaining history from patient or surrogate, ordering and performing treatments and interventions, ordering and review of laboratory studies, ordering and review of radiographic studies, pulse oximetry, re-evaluation of patient's condition and review of old charts.   (including critical care time) DIAGNOSTIC STUDIES: Oxygen Saturation is 95% on [place], adequate by my interpretation.    COORDINATION OF CARE: 3:35 AM- Pt advised of plan for treatment and pt agrees.   Labs Review Labs Reviewed  CBC WITH DIFFERENTIAL/PLATELET - Abnormal; Notable for the following:    WBC 14.2 (*)    Lymphs Abs 7.6 (*)    All other components within normal limits  BASIC METABOLIC PANEL - Abnormal; Notable for the following:    CO2 16 (*)    Glucose, Bld 328 (*)    Creatinine, Ser 1.08 (*)    GFR calc non Af Amer 46 (*)    GFR calc Af Amer 53 (*)    All other components within normal limits  BRAIN NATRIURETIC PEPTIDE - Abnormal; Notable for the following:    B Natriuretic Peptide 460.6  (*)    All other components within normal limits  I-STAT ARTERIAL BLOOD GAS, ED - Abnormal; Notable for the following:    pH, Arterial 7.162 (*)    pCO2 arterial 53.9 (*)    pO2, Arterial 118.0 (*)    Bicarbonate 19.3 (*)    Acid-base deficit 10.0 (*)    All other components within normal limits  I-STAT ARTERIAL BLOOD GAS, ED - Abnormal; Notable for the following:    pH, Arterial 7.311 (*)    pO2, Arterial 254.0 (*)    Acid-base deficit 4.0 (*)    All other components within normal limits  CULTURE, BLOOD (ROUTINE X 2)  CULTURE, BLOOD (ROUTINE X 2)  TROPONIN I    Imaging Review Dg Chest Port 1 View  09/30/2015   CLINICAL DATA:  Shortness of breath.  EXAM: PORTABLE CHEST 1 VIEW  COMPARISON:  05/14/2015  FINDINGS: Diffuse interstitial opacity with Kerley lines. Symmetric basilar airspace disease. Stable borderline cardiomegaly and aortic tortuosity. Background hyperinflation, likely COPD.  No effusion or air leak.  IMPRESSION: CHF superimposed on COPD.   Electronically Signed   By: Monte Fantasia M.D.   On: 09/30/2015 04:55   Serita Grit, MD has personally reviewed and evaluated these lab results as part of his medical decision-making.   EKG Interpretation   Date/Time:  Sunday September 30 2015 04:16:35 EDT Ventricular Rate:  84 PR Interval:  178 QRS Duration: 90 QT Interval:  385 QTC Calculation: 455 R Axis:   62 Text Interpretation:  Sinus rhythm LVH with secondary repolarization  abnormality compared to prior, now NSR Confirmed by [facility]  MD, TREY  ([id number]) on 09/30/2015 7:26:14 AM      MDM   Final diagnoses:  Shortness of breath  Respiratory distress  Acute respiratory failure with hypoxia and hypercapnia ([place])    82 yo female who presented with severe respiratory distress. She was cyanotic, hypoxic, and lethargic on arrival despite EMS CPAP.  Fortunately, her respiratory status improved  rapidly when placed on BiPAP in the ED.  Her pulmonary exam and CXR were  consistent with pulmonary edema.  Possibly flash pulmonary edema (EMS reported initial SBP in the 210's.)  They gave nitro prior to arrival.    Her EKG initially showed a fib with RVR, but her rate improved when her hypoxia improved and repeat EKG showed sinus rhythm.    History was limited, particularly initially, but additional history obtained from her daughter.  She had been feeling well, no illnesses, no fevers, no cough. She denies hx of CHF or COPD.    Covered empirically for CAP due to the severity of her distress, but clinical picture more consistent with pulmonary edema.  Plan to continue BiPap, obtained CT chest, and admit to stepdown.    I personally performed the services described in this documentation, which was scribed in my presence. The recorded information has been reviewed and is accurate.     Serita Grit, MD 09/30/15 [phone]

## 2015-09-30 NOTE — ED Notes (Signed)
Per [facility], pt woke up and called 911 for difficulty breathing. By the time EMS arrived pt was not able to speak, just nod. Pt immediately placed on CPAP with saturation at 80 % given 1  NTG tab prior to that. 20g to RAC and 18g to LFA. Afib on the monitor with unk hx of. Lives by herself.

## 2015-09-30 NOTE — ED Notes (Signed)
RT called to place pt back on Bipap and wean pressure settings

## 2015-09-30 NOTE — ED Notes (Signed)
Pt having cramps in her ankles. States when she has cramps in her legs she usually gets up and walks around. Agreed to allow pt stand at side of bed with assistance from this RN and daughter who is an Therapist, sports. Pt able to stand at the bedside of stretcher and step cramps out of her ankles.

## 2015-09-30 NOTE — H&P (Signed)
[facility] History and Physical  Amy Fischer NFA:[id number] DOB: June 11, 1929 DOA: 09/30/2015  Referring physician: ER PCP: [name], MD   Chief Complaint: Shortness of breath HPI:  81 year old female with a history of hypertension, carotid artery disease, brought in by EMS this morning after being woken up around 3 AM with shortness of breath. The patient was found to be in significant respiratory distress and unable to speak in full sentences, and was placed on Cpap, on arrival because of an oxygen saturation of 80%. She will also given sublingual nitroglycerin. No history of atrial fibrillation with initial recording on the telemetry monitor showed atrial fibrillation.  Patient was initially tachycardic with no heart rate of 131, blood pressure 186 /86. Her respiratory status improved after being placed on BiPAP. Chest x-ray showed pulmonary edema.Her EKG initially showed a fib with RVR, but her rate improved when her hypoxia improved and repeat EKG showed sinus rhythm. Chest x-ray and CT consistent with congestive heart failure. No PE. Initial pH was 7.16, PCO2 53, PO2 118. After being placed on BiPAP pH improved to 7.31,      Review of Systems  Unable to perform ROS: Severe respiratory distress    Past Medical History  Diagnosis Date  . GERD (gastroesophageal reflux disease)   . Hyperlipidemia   . Hypothyroidism   . Osteoarthritis   . Osteoporosis   . Episodic recurrent vertigo     MRI Head 2003  . Allergy   . Bladder polyps   . Subclavian steal syndrome     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; CTA 2013 revealed open graft  . Hyperplastic colon polyp   . Esophageal spasm   . History of hepatitis     unknown type  . Asymptomatic carotid artery stenosis     R ICA 40% stenosis on CTA 12/2011.  [name] Anemia   . Cataract     BILATERAL-REMOVED  . Elevated blood pressure 03/25/2011    Bp readings borderline today has hx of elvation  In office and ok at  home   Not checke recently   She gfeels was elevated from anxiety    . Diverticulosis   . Intestinal metaplasia of gastric mucosa   . Hepatic hemangioma      Past Surgical History  Procedure Laterality Date  . Appendectomy    . Cholecystectomy    . Carotid-subclavian bypass graft Left 1999    for [place] steal syndrome  . Rotator cuff repair Left   . Elbow surgery Left   . Cystectomy Left     hand  . Tubal ligation    . Tonsillectomy    . Colonoscopy    . Tear duct probing  07/2014      Social History:  reports that she quit smoking about 24 years ago. She has never used smokeless tobacco. She reports that she drinks about 2.4 oz of alcohol per week. She reports that she does not use illicit drugs.    Allergies  Allergen Reactions  . Augmentin [Amoxicillin-Pot Clavulanate] Diarrhea    Not allergic  2014  . Codeine Nausea And Vomiting  . Risedronate Sodium     unknown  . Statins     Muscles hurt     Family History  Problem Relation Age of Onset  . Hypertension Mother   . Heart disease Father   . Stroke Mother   . Colon cancer Neg Hx         Prior to Admission  medications   Medication Sig Start Date End Date Taking? Authorizing Provider  aspirin 325 MG tablet Take 325 mg by mouth daily.      Historical Provider, MD  azelastine (ASTELIN) 0.1 % nasal spray Place 2 sprays into both nostrils 2 (two) times daily. Use in each nostril as directed 07/26/14   Burnis Medin, MD  benzonatate (TESSALON) 100 MG capsule Take 1 capsule (100 mg total) by mouth 3 (three) times daily as needed for cough. 05/14/15   Burnis Medin, MD  CRESTOR 5 MG tablet TAKE ONE-HALF (1/2) TABLET DAILY 08/13/15   Burnis Medin, MD  doxycycline (VIBRA-TABS) 100 MG tablet Take 1 tablet (100 mg total) by mouth 2 (two) times daily. 05/14/15   Burnis Medin, MD  ezetimibe (ZETIA) 10 MG tablet Take 1 tablet (10 mg total) by mouth daily. 02/05/15   Burnis Medin, MD  fexofenadine (ALLEGRA) 180 MG tablet Take  180 mg by mouth daily.    Historical Provider, MD  fish oil-omega-3 fatty acids 1000 MG capsule Take 1 g by mouth daily.     Historical Provider, MD  glucosamine-chondroitin 500-400 MG tablet Take 1 tablet by mouth 3 (three) times daily.     Historical Provider, MD  ibuprofen (ADVIL,MOTRIN) 200 MG tablet Take 200 mg by mouth every 6 (six) hours as needed. Patient used this medication for a headache.    Historical Provider, MD  levothyroxine (SYNTHROID, LEVOTHROID) 100 MCG tablet TAKE 1 TABLET (100 MCG TOTAL) BY MOUTH DAILY BEFORE BREAKFAST. 07/27/15   Burnis Medin, MD  Melatonin 5 MG CAPS Take 1 capsule by mouth at bedtime as needed.    Historical Provider, MD  MULTIPLE VITAMIN PO 50+ Senior Vitamin Daily    Historical Provider, MD  nebivolol (BYSTOLIC) 2.5 MG tablet Take 1 tablet (2.5 mg total) by mouth daily. 07/26/14   Burnis Medin, MD  NON FORMULARY Calcium 600, Magnesium 300, D3 1000    Historical Provider, MD  omeprazole (PRILOSEC) 40 MG capsule TAKE 1 CAPSULE DAILY 02/08/15   Burnis Medin, MD  Polyethyl Glycol-Propyl Glycol (SYSTANE OP) Apply to eye. Use 1-2 Drops in both eyes    Historical Provider, MD  SALINE NASAL SPRAY NA     Historical Provider, MD  vitamin E 400 UNIT capsule Take 400 Units by mouth daily.    Historical Provider, MD  ZETIA 10 MG tablet TAKE 1 TABLET DAILY 07/06/15   Burnis Medin, MD     Physical Exam: Filed Vitals:   09/30/15 0800 09/30/15 0845 09/30/15 0850 09/30/15 0924  BP: 108/57 125/55 123/63   Pulse:  85 85 75  Temp:      TempSrc:      Resp:  21 20 18   Weight:      SpO2:  99% 100% 99%   Constitutional: She is somewhat disoriented. She appears well-developed and well-nourished. She appears distressed (respiratory distress). Tachypneic  Mottled, central cyanosis  HENT:  Head: Normocephalic and atraumatic.  Eyes: Conjunctivae are normal. Pupils are equal, round, and reactive to light. No scleral icterus.  Neck: Neck supple.  Cardiovascular: Normal  heart sounds and intact distal pulses. An irregularly irregular rhythm present. Tachycardia present.  No murmur heard. Pulmonary/Chest: Effort normal. No stridor. No respiratory distress. She has rales (diffuse rales).  Abdominal: Soft. Bowel sounds are normal. She exhibits no distension. There is no tenderness.  Musculoskeletal: Normal range of motion.  Neurological: She is alert and oriented to person, place, and  time.  Lethargic  Skin: Skin is warm. No rash noted.   Psychiatric: Normal mood and affect. speech and behavior is normal. Judgment and thought content normal. Cognition and memory are normal.      Data Review   Micro Results Recent Results (from the past 240 hour(s))  Blood culture (routine x 2)     Status: None (Preliminary result)   Collection Time: 09/30/15  6:53 AM  Result Value Ref Range Status   Specimen Description BLOOD RIGHT ANTECUBITAL  Final   Special Requests BOTTLES DRAWN AEROBIC AND ANAEROBIC 5CC  Final   Culture PENDING  Incomplete   Report Status PENDING  Incomplete    Radiology Reports Ct Angio Chest Pe W/cm &/or Wo Cm  09/30/2015   CLINICAL DATA:  Shortness of breath and decreased oxygen saturation  EXAM: CT ANGIOGRAPHY CHEST WITH CONTRAST  TECHNIQUE: Multidetector CT imaging of the chest was performed using the standard protocol during bolus administration of intravenous contrast. Multiplanar CT image reconstructions and MIPs were obtained to evaluate the vascular anatomy.  CONTRAST:  78mL OMNIPAQUE IOHEXOL 350 MG/ML SOLN  COMPARISON:  Chest radiograph September 30, 2015  FINDINGS: There is no demonstrable pulmonary embolus. There is atherosclerotic change in the thoracic aorta without aneurysm. There is atherosclerotic change in the visualized great vessels without high-grade obstruction appreciable.  There are small pleural effusions bilaterally. There is underlying centrilobular emphysematous change. There is interstitial and patchy alveolar edema  bilaterally.  There is reflux of contrast into the inferior vena cava and hepatic veins. Heart is slightly enlarged. There is no appreciable pericardial effusion. There are scattered foci of coronary artery calcification.  The thyroid is diminutive. There is no appreciable thoracic adenopathy. Small mediastinal lymph nodes are present which do not meet size criteria for pathologic significance.  In the visualized upper abdomen, gallbladder is absent. There is atherosclerotic change in the visualized upper abdominal aorta.  There are no blastic or lytic bone lesions. There is degenerative change in the thoracic spine.  Review of the MIP images confirms the above findings.  IMPRESSION: No demonstrable pulmonary embolus. Evidence of congestive heart failure superimposed on emphysematous change.  No demonstrable adenopathy. Scattered foci of coronary artery calcification noted.  Reflux of contrast into the inferior vena cava and hepatic veins is indicative of increased right heart pressure.   Electronically Signed   By: Lowella Grip III M.D.   On: 09/30/2015 08:24   Dg Chest Port 1 View  09/30/2015   CLINICAL DATA:  Shortness of breath.  EXAM: PORTABLE CHEST 1 VIEW  COMPARISON:  05/14/2015  FINDINGS: Diffuse interstitial opacity with Kerley lines. Symmetric basilar airspace disease. Stable borderline cardiomegaly and aortic tortuosity. Background hyperinflation, likely COPD.  No effusion or air leak.  IMPRESSION: CHF superimposed on COPD.   Electronically Signed   By: Monte Fantasia M.D.   On: 09/30/2015 04:55     CBC  Recent Labs Lab 09/30/15 0335  WBC 14.2*  HGB 14.2  HCT 44.6  PLT 246  MCV 97.8  MCH 31.1  MCHC 31.8  RDW 13.9  LYMPHSABS 7.6*  MONOABS 1.0  EOSABS 0.6  BASOSABS 0.0    Chemistries   Recent Labs Lab 09/30/15 0335  NA 137  K 3.9  CL 106  CO2 16*  GLUCOSE 328*  BUN 15  CREATININE 1.08*  CALCIUM 9.0    ------------------------------------------------------------------------------------------------------------------ estimated creatinine clearance is 31.4 mL/min (by C-G formula based on Cr of 1.08). ------------------------------------------------------------------------------------------------------------------ No results for input(s): HGBA1C in  the last 72 hours. ------------------------------------------------------------------------------------------------------------------ No results for input(s): CHOL, HDL, LDLCALC, TRIG, CHOLHDL, LDLDIRECT in the last 72 hours. ------------------------------------------------------------------------------------------------------------------ No results for input(s): TSH, T4TOTAL, T3FREE, THYROIDAB in the last 72 hours.  Invalid input(s): FREET3 ------------------------------------------------------------------------------------------------------------------ No results for input(s): VITAMINB12, FOLATE, FERRITIN, TIBC, IRON, RETICCTPCT in the last 72 hours.  Coagulation profile No results for input(s): INR, PROTIME in the last 168 hours.  No results for input(s): DDIMER in the last 72 hours.  Cardiac Enzymes  Recent Labs Lab 09/30/15 0335  TROPONINI <0.03   ------------------------------------------------------------------------------------------------------------------ Invalid input(s): POCBNP   CBG: No results for input(s): GLUCAP in the last 168 hours.     EKG: Independently reviewed.    Assessment/Plan   Acute hypoxic respiratory failure  likely secondary to new onset atrial fibrillation and new onset congestive heart failure. Most likely acute on chronic diastolic heart failure. Patient placed on BiPAP. Received empiric antibiotics. Blood pressure quickly dropped from >200 to 90s. Patient was actually given a fluid bolus and started on empiric antibiotics for possible underlying infection/sepsis. Etiology not very clear as to why  patient developed. Blood cultures drawn. Will continue antibiotics per [facility] protocol given elevated white count, follow ABG,      Hypothyroidism-check TSH, continue Synthroid    GERD-on Protonix    Hypertension, isolated systolic-continue by Bystolic with hold parameters    Acute on chronic diastolic congestive heart failure, NYHA class 3 (HCC)-cycle cardiac enzymes, will obtain 2-D echo, proBNP elevated at 460. Hold diuresis given low blood pressure. If systolic greater than 868 will start low-dose Lasix IV to wean patient off BiPAP.  Code Status:   full Family Communication: bedside Disposition Plan: admit   Total time spent 55 minutes.Greater than 50% of this time was spent in counseling, explanation of diagnosis, planning of further management, and coordination of care  [facility] Pager [phone]  If 7PM-7AM, please contact night-coverage [email or web address] Password [facility] 09/30/2015, 9:43 AM

## 2015-09-30 NOTE — ED Notes (Signed)
RT at bedside, pt taken off Bipap and placed on cannula at 3.5 liters for trial off Bipap

## 2015-09-30 NOTE — Progress Notes (Signed)
Utilization Review Completed.Amy Fischer T10/01/2015  

## 2015-09-30 NOTE — ED Notes (Signed)
Report called  

## 2015-09-30 NOTE — ED Notes (Signed)
Returned from ct scan 

## 2015-09-30 NOTE — ED Notes (Signed)
Family at bedside. Daughter- [name] was called earlier when pt arrived

## 2015-10-01 ENCOUNTER — Ambulatory Visit (HOSPITAL_COMMUNITY): Payer: Medicare Other

## 2015-10-01 DIAGNOSIS — J9602 Acute respiratory failure with hypercapnia: Secondary | ICD-10-CM

## 2015-10-01 DIAGNOSIS — R0600 Dyspnea, unspecified: Secondary | ICD-10-CM

## 2015-10-01 DIAGNOSIS — J9601 Acute respiratory failure with hypoxia: Secondary | ICD-10-CM | POA: Insufficient documentation

## 2015-10-01 DIAGNOSIS — I480 Paroxysmal atrial fibrillation: Secondary | ICD-10-CM

## 2015-10-01 LAB — CBC
HCT: 38.1 % (ref 36.0–46.0)
HEMOGLOBIN: 12.3 g/dL (ref 12.0–15.0)
MCH: 31.1 pg (ref 26.0–34.0)
MCHC: 32.3 g/dL (ref 30.0–36.0)
MCV: 96.5 fL (ref 78.0–100.0)
PLATELETS: 191 10*3/uL (ref 150–400)
RBC: 3.95 MIL/uL (ref 3.87–5.11)
RDW: 14 % (ref 11.5–15.5)
WBC: 11.1 10*3/uL — ABNORMAL HIGH (ref 4.0–10.5)

## 2015-10-01 LAB — COMPREHENSIVE METABOLIC PANEL
ALBUMIN: 3 g/dL — AB (ref 3.5–5.0)
ALK PHOS: 50 U/L (ref 38–126)
ALT: 13 U/L — AB (ref 14–54)
AST: 21 U/L (ref 15–41)
Anion gap: 7 (ref 5–15)
BUN: 21 mg/dL — AB (ref 6–20)
CALCIUM: 8.6 mg/dL — AB (ref 8.9–10.3)
CO2: 23 mmol/L (ref 22–32)
CREATININE: 0.89 mg/dL (ref 0.44–1.00)
Chloride: 108 mmol/L (ref 101–111)
GFR calc Af Amer: >60 mL/min (ref >60)
GFR calc non Af Amer: 58 mL/min — ABNORMAL LOW (ref >60)
GLUCOSE: 116 mg/dL — AB (ref 65–99)
Potassium: 4.1 mmol/L (ref 3.5–5.1)
SODIUM: 138 mmol/L (ref 135–145)
Total Bilirubin: 0.9 mg/dL (ref 0.3–1.2)
Total Protein: 5.8 g/dL — ABNORMAL LOW (ref 6.5–8.1)

## 2015-10-01 LAB — PATHOLOGIST SMEAR REVIEW: Path Review: REACTIVE

## 2015-10-01 LAB — HEMOGLOBIN A1C
Hgb A1c MFr Bld: 5.7 % — ABNORMAL HIGH (ref 4.8–5.6)
Mean Plasma Glucose: 117 mg/dL

## 2015-10-01 MED ORDER — CETYLPYRIDINIUM CHLORIDE 0.05 % MT LIQD
7.0000 mL | Freq: Two times a day (BID) | OROMUCOSAL | Status: DC
  Administered 2015-10-01 – 2015-10-04 (×7): 7 mL via OROMUCOSAL

## 2015-10-01 NOTE — Progress Notes (Signed)
Patient transferred from unit 2H to room 3E13 at this time. Alert and in stable condition. Family at bedside.

## 2015-10-01 NOTE — Progress Notes (Addendum)
PROGRESS NOTE  Amy Fischer WER:[id number] DOB: [birth year] DOA: 09/30/2015 PCP: [name], MD  Brief history 81 year old female with a history of hypertension, carotid stenosis workup around 3 AM on 09/30/2015 with shortness of breath. The patient was found to be in respiratory distress unable to speak full sentences. EMS was activated and the patient was found to have oxygen saturation of 80%. Initial telemetry revealed atrial fibrillation with RVR. The patient was placed on BiPAP and started on intravenous furosemide with good clinical effect. Chest x-ray revealed pulmonary edema. CT angiogram of the chest was negative for pulmonary embolus. The patient has been complaining of decreased exercise endurance with her activities of daily living for the past week to 2 weeks, but she has denied any chest discomfort. Assessment/Plan: Acute Respiratory Failure with hypoxia and hypercapnea -stable on 2 L without increased WOB -wean for oxygen sat >92% -secondary to CHF with underlying COPD -CT angiogram chest--negative for pulmonary embolus but revealed small bilateral pleural effusions with emphysema; interstitial and alveolar edema present Acute Diastolic CHF -still appears on the hypervolemic side clinically -continue Lasix 20 mg bid -Suspect R-sided CHF -await echo results -daily weights -continue Bystolic -d/c antibiotics PAF -now back in sinus -CHADSVASc = 6 -discussed starting apixaban with pt--she wants to discuss with family -continue ASA for now -TSH--0.586 Elevated troponin -Secondary to demand ischemia in the setting of diastolic CHF -No symptoms of angina Hyperlipidemia -Continue Crestor Hypothyroidism -Continue Synthroid -TSH 0.586 GERD -continue PPI  Family Communication:   Pt at beside Disposition Plan:   Home when medically stable       Procedures/Studies: Ct Angio Chest Pe W/cm &/or Wo Cm  09/30/2015   CLINICAL DATA:  Shortness of  breath and decreased oxygen saturation  EXAM: CT ANGIOGRAPHY CHEST WITH CONTRAST  TECHNIQUE: Multidetector CT imaging of the chest was performed using the standard protocol during bolus administration of intravenous contrast. Multiplanar CT image reconstructions and MIPs were obtained to evaluate the vascular anatomy.  CONTRAST:  36mL OMNIPAQUE IOHEXOL 350 MG/ML SOLN  COMPARISON:  Chest radiograph September 30, 2015  FINDINGS: There is no demonstrable pulmonary embolus. There is atherosclerotic change in the thoracic aorta without aneurysm. There is atherosclerotic change in the visualized great vessels without high-grade obstruction appreciable.  There are small pleural effusions bilaterally. There is underlying centrilobular emphysematous change. There is interstitial and patchy alveolar edema bilaterally.  There is reflux of contrast into the inferior vena cava and hepatic veins. Heart is slightly enlarged. There is no appreciable pericardial effusion. There are scattered foci of coronary artery calcification.  The thyroid is diminutive. There is no appreciable thoracic adenopathy. Small mediastinal lymph nodes are present which do not meet size criteria for pathologic significance.  In the visualized upper abdomen, gallbladder is absent. There is atherosclerotic change in the visualized upper abdominal aorta.  There are no blastic or lytic bone lesions. There is degenerative change in the thoracic spine.  Review of the MIP images confirms the above findings.  IMPRESSION: No demonstrable pulmonary embolus. Evidence of congestive heart failure superimposed on emphysematous change.  No demonstrable adenopathy. Scattered foci of coronary artery calcification noted.  Reflux of contrast into the inferior vena cava and hepatic veins is indicative of increased right heart pressure.   Electronically Signed   By: Lowella Grip III M.D.   On: 09/30/2015 08:24   Dg Chest Port 1 View  09/30/2015   CLINICAL DATA:   Shortness of  breath.  EXAM: PORTABLE CHEST 1 VIEW  COMPARISON:  05/14/2015  FINDINGS: Diffuse interstitial opacity with Kerley lines. Symmetric basilar airspace disease. Stable borderline cardiomegaly and aortic tortuosity. Background hyperinflation, likely COPD.  No effusion or air leak.  IMPRESSION: CHF superimposed on COPD.   Electronically Signed   By: Monte Fantasia M.D.   On: 09/30/2015 04:55         Subjective: Patient still has some dyspnea on exertion but is breathing much better. Denies any fevers, chills, chest pain, just breath, nausea, vomiting, diarrhea, abdominal pain, dysuria, hematuria. No rashes. No hematochezia or melena.  Objective: Filed Vitals:   10/01/15 0945 10/01/15 1000 10/01/15 1030 10/01/15 1218  BP: 116/46   106/44  Pulse: 77 74 79 77  Temp:    97.8 F (36.6 C)  TempSrc:    Oral  Resp: 29 24 28 26   Height:      Weight:      SpO2: 96% 95% 91% 95%    Intake/Output Summary (Last 24 hours) at 10/01/15 1553 Last data filed at 10/01/15 1530  Gross per 24 hour  Intake   1032 ml  Output   2053 ml  Net  -1021 ml   Weight change: 1.936 kg (4 lb 4.3 oz) Exam:   General:  Pt is alert, follows commands appropriately, not in acute distress  HEENT: No icterus, No thrush, No neck mass, /AT  Cardiovascular: RRR, S1/S2, no rubs, no gallops  Respiratory: Fine bibasilar crackles, left greater than right. No wheezing  Abdomen: Soft/+BS, non tender, non distended, no guarding; no hepatosplenomegaly  Extremities: trace LE edema, No lymphangitis, No petechiae, No rashes, no synovitis; clubbing of the fingernails without any cyanosis  Data Reviewed: Basic Metabolic Panel:  Recent Labs Lab 09/30/15 0335 09/30/15 1010 10/01/15 0235  NA 137  --  138  K 3.9  --  4.1  CL 106  --  108  CO2 16*  --  23  GLUCOSE 328*  --  116*  BUN 15  --  21*  CREATININE 1.08* 0.82  0.79 0.89  CALCIUM 9.0  --  8.6*  MG  --  2.1  --    Liver Function Tests:  Recent  Labs Lab 09/30/15 1010 10/01/15 0235  AST 30 21  ALT 20 13*  ALKPHOS 57 50  BILITOT 0.5 0.9  PROT 5.8* 5.8*  ALBUMIN 3.2* 3.0*   No results for input(s): LIPASE, AMYLASE in the last 168 hours. No results for input(s): AMMONIA in the last 168 hours. CBC:  Recent Labs Lab 09/30/15 0335 09/30/15 1010 10/01/15 0235  WBC 14.2* 10.1  10.3 11.1*  NEUTROABS 5.0  --   --   HGB 14.2 13.5  13.5 12.3  HCT 44.6 41.2  40.9 38.1  MCV 97.8 94.7  94.5 96.5  PLT 246 208  203 191   Cardiac Enzymes:  Recent Labs Lab 09/30/15 0335 09/30/15 1010 09/30/15 1453 09/30/15 2030  TROPONINI <0.03 0.08* 0.07* 0.04*   BNP: Invalid input(s): POCBNP CBG: No results for input(s): GLUCAP in the last 168 hours.  Recent Results (from the past 240 hour(s))  Blood culture (routine x 2)     Status: None (Preliminary result)   Collection Time: 09/30/15  6:53 AM  Result Value Ref Range Status   Specimen Description BLOOD RIGHT ANTECUBITAL  Final   Special Requests BOTTLES DRAWN AEROBIC AND ANAEROBIC 5CC  Final   Culture NO GROWTH 1 DAY  Final   Report Status PENDING  Incomplete  Blood culture (routine x 2)     Status: None (Preliminary result)   Collection Time: 09/30/15  6:55 AM  Result Value Ref Range Status   Specimen Description BLOOD RIGHT HAND  Final   Special Requests BOTTLES DRAWN AEROBIC ONLY 3CC  Final   Culture NO GROWTH 1 DAY  Final   Report Status PENDING  Incomplete  MRSA PCR Screening     Status: None   Collection Time: 09/30/15 10:57 AM  Result Value Ref Range Status   MRSA by PCR NEGATIVE NEGATIVE Final    Comment:        The GeneXpert MRSA Assay (FDA approved for NASAL specimens only), is one component of a comprehensive MRSA colonization surveillance program. It is not intended to diagnose MRSA infection nor to guide or monitor treatment for MRSA infections.      Scheduled Meds: . antiseptic oral rinse  7 mL Mouth Rinse BID  . enoxaparin (LOVENOX) injection   40 mg Subcutaneous Q24H  . furosemide  20 mg Intravenous Q12H  . levothyroxine  100 mcg Oral QAC breakfast  . nebivolol  2.5 mg Oral Daily  . pantoprazole  40 mg Oral Daily  . piperacillin-tazobactam (ZOSYN)  IV  3.375 g Intravenous 3 times per day  . rosuvastatin  2.5 mg Oral Daily  . sodium chloride  3 mL Intravenous Q12H  . sodium chloride  3 mL Intravenous Q12H  . vancomycin  750 mg Intravenous Q24H   Continuous Infusions:    Christpher Stogsdill, DO  [facility] Pager [phone]  If 7PM-7AM, please contact night-coverage [email or web address] Password [email or web address] 10/01/2015, 3:53 PM   LOS: 1 day

## 2015-10-01 NOTE — Progress Notes (Signed)
  Echocardiogram 2D Echocardiogram has been performed.  Johny Chess 10/01/2015, 2:34 PM

## 2015-10-02 ENCOUNTER — Inpatient Hospital Stay (HOSPITAL_COMMUNITY): Payer: Medicare Other

## 2015-10-02 ENCOUNTER — Encounter (HOSPITAL_COMMUNITY): Payer: Self-pay | Admitting: Student

## 2015-10-02 DIAGNOSIS — I340 Nonrheumatic mitral (valve) insufficiency: Principal | ICD-10-CM

## 2015-10-02 DIAGNOSIS — E038 Other specified hypothyroidism: Secondary | ICD-10-CM

## 2015-10-02 LAB — CBC
HEMATOCRIT: 38.8 % (ref 36.0–46.0)
HEMOGLOBIN: 12.9 g/dL (ref 12.0–15.0)
MCH: 31.7 pg (ref 26.0–34.0)
MCHC: 33.2 g/dL (ref 30.0–36.0)
MCV: 95.3 fL (ref 78.0–100.0)
PLATELETS: 186 10*3/uL (ref 150–400)
RBC: 4.07 MIL/uL (ref 3.87–5.11)
RDW: 13.9 % (ref 11.5–15.5)
WBC: 9.1 10*3/uL (ref 4.0–10.5)

## 2015-10-02 LAB — BASIC METABOLIC PANEL
Anion gap: 8 (ref 5–15)
BUN: 17 mg/dL (ref 6–20)
CHLORIDE: 100 mmol/L — AB (ref 101–111)
CO2: 29 mmol/L (ref 22–32)
CREATININE: 0.93 mg/dL (ref 0.44–1.00)
Calcium: 8.9 mg/dL (ref 8.9–10.3)
GFR calc non Af Amer: 55 mL/min — ABNORMAL LOW (ref >60)
Glucose, Bld: 107 mg/dL — ABNORMAL HIGH (ref 65–99)
POTASSIUM: 3.8 mmol/L (ref 3.5–5.1)
SODIUM: 137 mmol/L (ref 135–145)

## 2015-10-02 MED ORDER — APIXABAN 2.5 MG PO TABS: 2.5000 mg | ORAL_TABLET | Freq: Two times a day (BID) | ORAL | Status: DC

## 2015-10-02 MED ORDER — ASPIRIN EC 81 MG PO TBEC
81.0000 mg | DELAYED_RELEASE_TABLET | Freq: Every day | ORAL | Status: DC
Start: 2015-10-02 — End: 2015-10-10
  Administered 2015-10-02 – 2015-10-09 (×9): 81 mg via ORAL
  Filled 2015-10-02 (×10): qty 1

## 2015-10-02 NOTE — Consult Note (Signed)
CARDIOLOGY CONSULT NOTE   Patient ID: Amy Fischer MRN: [id number], DOB/AGE: Jun 02, 1929   Admit date: 09/30/2015 Date of Consult: 10/02/2015 Reason for  Consult: Atrial Fibrillation and CHF   Primary Physician: [name], MD Primary Cardiologist: New   HPI: Amy Fischer is a 81 y.o. female with PMH of HLD, Hypothyroidism, [name] (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 in 1999, CTA in 2013 showed open graft) who presented to [facility] ED on 09/30/2015 for shortness of breath.    The patient reports on 09/30/2015 she woke up at 3:00 AM from sleep due to not being able to catch her breath. She sat up, thinking that would help but did not receive any relief. She denies any associated symptoms at that time such as chest pain, radiating pain, palpitations, nausea or vomiting. She notified EMS and was transported to the ED.  In the ED, she was noted to be in atrial fibrillation while on telemetry with rate in 130's. She was placed on BiPAP and HR improved and atrial fibrillation resolved. CXR was obtained showing diffuse interstitial opacity with [name] lines, symmetric basilar airspace disease, and stable borderline cardiomegaly and aortic tortuosity.  CT imaging showed no evidence of PE, but did show evidence of congestive heart failure superimposed on emphysematous change.Reflux of contrast into the inferior vena cava and hepatic veins was noted and is indicative of increased right heart pressure. BNP elevated to 460.    Echocardiogram on 10/01/2015 showed preserved EF of 65-70% and mild LVH. Aortic valve area (VTI) was 0.98 cm^2. Valve area (Vmax): 1.07 cm^2. Moderate mitral regurgitation was noted and the RV was mildly dilated. PA peak pressure was elevated at 66 mm Hg (S).  She has been receiving Lasix 20mg  IV BID with only a net output of -557mL since admission but with output of -2.5L yesterday. She reports her breathing has significantly improved. She was  transitioned from BiPAP to [place] and is now on room air and denies any shortness of breath.   She reports decreased exercise intolerance over the past two years and says she use to walk 2 miles daily, but underwent left  knee surgery in 2014 and has only been walking 0.3 miles daily since. She does report having some shortness of breath when lifting daily weights but no shortness of breath when carrying out her normal daily activities. Her weight is usually between 114-115 lbs and she denies any recent changes in her weight. Denies any lower extremity edema.  She also reports having palpitations about two times per month over the past year, with the palpitations lasting several seconds then resolving. She notified her PCP of this and EKG tracings were obtained but showed no irregular heart rhythms. When noted to be in atrial fibrillation upon arrival to the ED, she does not recall having palpitations at that time, but says her breathing was her main focus.   She has never been seen by a cardiologist prior to this consult.     Problem List Past Medical History  Diagnosis Date  . GERD (gastroesophageal reflux disease)   . Hyperlipidemia   . Hypothyroidism   . Osteoarthritis   . Osteoporosis   . Episodic recurrent vertigo     MRI Head 2003  . Allergy   . Bladder polyps   . Subclavian steal syndrome     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; CTA 2013 revealed open graft  . Hyperplastic colon polyp   . Esophageal  spasm   . History of hepatitis     unknown type  . Asymptomatic carotid artery stenosis     R ICA 40% stenosis on CTA 12/2011.  [name] Anemia   . Cataract     BILATERAL-REMOVED  . Elevated blood pressure 03/25/2011    Bp readings borderline today has hx of elvation  In office and ok at home   Not checke recently   She gfeels was elevated from anxiety    . Diverticulosis   . Intestinal metaplasia of gastric mucosa   . Hepatic hemangioma     Past Surgical History  Procedure  Laterality Date  . Appendectomy    . Cholecystectomy    . Carotid-subclavian bypass graft Left 1999    for [place] steal syndrome  . Rotator cuff repair Left   . Elbow surgery Left   . Cystectomy Left     hand  . Tubal ligation    . Tonsillectomy    . Colonoscopy    . Tear duct probing  07/2014     Allergies Allergies  Allergen Reactions  . Codeine Nausea And Vomiting  . Risedronate Sodium     unknown  . Statins     Muscles hurt   . Augmentin [Amoxicillin-Pot Clavulanate] Diarrhea    Not allergic  2014      Inpatient Medications . antiseptic oral rinse  7 mL Mouth Rinse BID  . apixaban  2.5 mg Oral BID  . furosemide  20 mg Intravenous Q12H  . levothyroxine  100 mcg Oral QAC breakfast  . nebivolol  2.5 mg Oral Daily  . pantoprazole  40 mg Oral Daily  . rosuvastatin  2.5 mg Oral Daily  . sodium chloride  3 mL Intravenous Q12H  . sodium chloride  3 mL Intravenous Q12H    Family History Family History  Problem Relation Age of Onset  . Hypertension Mother   . Heart disease Father   . Stroke Mother   . Colon cancer Neg Hx      Social History Social History   Social History  . Marital Status: Widowed    Spouse Name: N/A  . Number of Children: 4  . Years of Education: N/A   Occupational History  . retired    Social History Main Topics  . Smoking status: Former Smoker    Quit date: 01/05/1991  . Smokeless tobacco: Never Used  . Alcohol Use: 2.4 oz/week    4 Glasses of wine per week     Comment: socially  . Drug Use: No  . Sexual Activity: Not on file   Other Topics Concern  . Not on file   Social History Narrative   Widowed   HH of 1    No pets   Former smoker   Exercises regularly     Review of Systems General:  No chills, fever, night sweats or weight changes.  Cardiovascular:  No chest pain, edema, or orthopnea.  Positive for palpitations, dyspnea, and paroxysmal nocturnal dyspnea. Dermatological: No rash, lesions/masses Respiratory: No  cough, Positive for dyspnea Urologic: No hematuria, dysuria Abdominal:   No nausea, vomiting, bright red blood per rectum, melena, or hematemesis. Positive for diarrhea. Neurologic:  No visual changes, wkns, changes in mental status. All other systems reviewed and are otherwise negative except as noted above.  Physical Exam  Blood pressure 118/49, pulse 74, temperature 98.6 F (37 C), temperature source Oral, resp. rate 18, height 5\' 2"  (1.575 m), weight 114 lb 1.6 oz (  51.755 kg), SpO2 93 %.  General: Pleasant, Caucasian female in NAD Psych: Normal affect. Neuro: Alert and oriented X 3. Moves all extremities spontaneously. HEENT: Normal  Neck: Supple without bruits. JVD elevated. Lungs:  Resp regular and unlabored, Rales at bases bilaterally. Heart: RRR no s3, s4, 3/6 SEM noted. Abdomen: Soft, non-tender, non-distended, BS + x 4.  Extremities: No clubbing, cyanosis or edema. DP/PT/Radials 2+ and equal bilaterally.  Labs  Recent Labs  09/30/15 0335 09/30/15 1010 09/30/15 1453 09/30/15 2030  TROPONINI <0.03 0.08* 0.07* 0.04*   Lab Results  Component Value Date   WBC 9.1 10/02/2015   HGB 12.9 10/02/2015   HCT 38.8 10/02/2015   MCV 95.3 10/02/2015   PLT 186 10/02/2015     Recent Labs Lab 10/01/15 0235 10/02/15 0522  NA 138 137  K 4.1 3.8  CL 108 100*  CO2 23 29  BUN 21* 17  CREATININE 0.89 0.93  CALCIUM 8.6* 8.9  PROT 5.8*  --   BILITOT 0.9  --   ALKPHOS 50  --   ALT 13*  --   AST 21  --   GLUCOSE 116* 107*   Radiology/Studies Ct Angio Chest Pe W/cm &/or Wo Cm: 09/30/2015   CLINICAL DATA:  Shortness of breath and decreased oxygen saturation  EXAM: CT ANGIOGRAPHY CHEST WITH CONTRAST  TECHNIQUE: Multidetector CT imaging of the chest was performed using the standard protocol during bolus administration of intravenous contrast. Multiplanar CT image reconstructions and MIPs were obtained to evaluate the vascular anatomy.  CONTRAST:  21mL OMNIPAQUE IOHEXOL 350 MG/ML  SOLN  COMPARISON:  Chest radiograph September 30, 2015  FINDINGS: There is no demonstrable pulmonary embolus. There is atherosclerotic change in the thoracic aorta without aneurysm. There is atherosclerotic change in the visualized great vessels without high-grade obstruction appreciable.  There are small pleural effusions bilaterally. There is underlying centrilobular emphysematous change. There is interstitial and patchy alveolar edema bilaterally.  There is reflux of contrast into the inferior vena cava and hepatic veins. Heart is slightly enlarged. There is no appreciable pericardial effusion. There are scattered foci of coronary artery calcification.  The thyroid is diminutive. There is no appreciable thoracic adenopathy. Small mediastinal lymph nodes are present which do not meet size criteria for pathologic significance.  In the visualized upper abdomen, gallbladder is absent. There is atherosclerotic change in the visualized upper abdominal aorta.  There are no blastic or lytic bone lesions. There is degenerative change in the thoracic spine.  Review of the MIP images confirms the above findings.  IMPRESSION: No demonstrable pulmonary embolus. Evidence of congestive heart failure superimposed on emphysematous change.  No demonstrable adenopathy. Scattered foci of coronary artery calcification noted.  Reflux of contrast into the inferior vena cava and hepatic veins is indicative of increased right heart pressure.   Electronically Signed   By: Lowella Grip III M.D.   On: 09/30/2015 08:24   Dg Chest Port 1 View: 09/30/2015   CLINICAL DATA:  Shortness of breath.  EXAM: PORTABLE CHEST 1 VIEW  COMPARISON:  05/14/2015  FINDINGS: Diffuse interstitial opacity with Kerley lines. Symmetric basilar airspace disease. Stable borderline cardiomegaly and aortic tortuosity. Background hyperinflation, likely COPD.  No effusion or air leak.  IMPRESSION: CHF superimposed on COPD.   Electronically Signed   By: Monte Fantasia  M.D.   On: 09/30/2015 04:55    ECG: Sinus rhythm with rate in 80's.   ECHOCARDIOGRAM:  Study Conclusions - Left ventricle: The cavity size was normal. Wall  thickness was increased in a pattern of mild LVH. Systolic function was vigorous. The estimated ejection fraction was in the range of 65% to 70%. Wall motion was normal; there were no regional wall motion abnormalities. - Aortic valve: Valve area (VTI): 0.98 cm^2. Valve area (Vmax): 1.07 cm^2. Valve area (Vmean): 1.08 cm^2. - Mitral valve: There was moderate regurgitation. - Right ventricle: The cavity size was mildly dilated. - Pulmonary arteries: Systolic pressure was moderately to severely increased. PA peak pressure: 66 mm Hg (S).  ASSESSMENT AND PLAN  1. Acute Diastolic CHF - CXR was obtained showing diffuse interstitial opacity with [name] lines, symmetric basilar airspace disease, and stable borderline cardiomegaly and aortic tortuosity. CT imaging showed congestive heart failure superimposed on emphysematous change with reflux of contrast into the inferior vena cava and hepatic veins was noted and is indicative of increased right heart pressure.  - BNP elevated to 460. Patient is at baseline weight of 114lbs.  - Echo showed preserved EF of 65-70% with mild LVH, moderate MR, and PA peak pressure of 66. - Has been receiving Lasix 20mg  IV BID. Still appears volume overloaded on exam with JVD elevated and rales at bases. Continue diuresis.  2. Severe MR - in reviewing the echocardiogram, Dr. Stanford Breed makes note the patient has severe MR.  - will plan on obtaining TEE tomorrow. Will need Right Heart Cath in the coming days.  3. Paroxysmal Atrial Fibrillation - was noted to be in atrial fibrillation upon arrival to the ED. Converted to NSR upon being on BiPAP. There are no recorded tracings of this besides the EKG on file which shows sinus rhythm with PAC's. - patient reports no history of "irregular heart rhythms".  Has remained in NSR with HR in 70's -80's on telemetry. - recently started on Eliquis for anticoagulation due to patients CHA2DS2-VASc Score of 6. With no recorded tracings of atrial fibrillation, will discontinue Eliquis.  4. Acute Respiratory Failure - was requiring BiPAP at admission. Now currently on Room Air. - was initially started on Vancomycin and Zosyn due to acute respiratory failure and elevated WBC. These have now been discontinued  5. HLD - continue statin therapy  Signed, Erma Heritage, PA-C 10/02/2015, 3:58 PM Pager: [phone] As above, patient seen and examined. Briefly she is an 81 year old female with a past medical history of hypertension, hyperlipidemia, hypothyroidism, subclavian steal for evaluation of question atrial fibrillation and congestive heart failure. Patient complains of 6 months of increasing dyspnea on exertion. On October 2 she awoke at approximately 2 AM with increased dyspnea. She walked to the bathroom and became severely dyspneic. She denied chest pain, palpitations, fevers, chills or productive cough. She was admitted and treated with diuretics with improvement. Cardiology is now asked to evaluate. Physical exam is notable for 3/6 systolic murmur apex and 2/6 systolic murmur left sternal border. Electrocardiogram appears to show sinus rhythm with PACs and nonspecific ST changes. Troponin 0.08. I have reviewed the patient's echocardiogram. This appears to reveal normal LV function, prolapse of the posterior mitral valve leaflet with severe eccentric mitral regurgitation. Pulmonary pressures are moderate to severely elevated. There is an elevated gradient across the aortic valve but visually the valve appears to open normally.  1 severe mitral regurgitation-the patient appears to have prolapse of the posterior mitral valve leaflet with severe eccentric mitral regurgitation. I believe this explains her presentation of acute congestive heart failure. We  will proceed with transesophageal echocardiogram tomorrow to fully assess her mitral valve morphology, severity of  mitral regurgitation and aortic valve/left ventricular outflow tract for elevated gradient. She will also need a right and left cardiac catheterization to exclude coronary disease and measure pulmonary pressures (likely Thursday). She will ultimately require mitral valve surgery. DC apixaban. Add aspirin 81 mg daily. Continue Lasix 20 mg twice a day as she is improving. Follow renal function. 2 Question atrial fibrillation-I have reviewed her electrocardiogram and I believe this shows sinus tachycardia with PACs. I can find no atrial fibrillation on telemetry. 3 hypertension-continue present medications. 4 hyperlipidemia-continue statin. Kirk Ruths

## 2015-10-02 NOTE — Care Management Note (Signed)
Case Management Note  Patient Details  Name: Amy Fischer MRN: [id number] Date of Birth: 1929/07/05  Subjective/Objective:    Admitted with Respiratory Distress                Action/Plan: Patient lives alone, is independent at home, continues to drive, grocery shop and is active in the community. She does not use any DME. Has private insurance with Medicare and Tricare with prescription drug coverage; [facility] choice [facility]. No needs identified at this time.  Expected Discharge Date:  Possibly 10/04/2015               Expected Discharge Plan:  Home/Self Care  Discharge planning Services  CM Consult  Status of Service:  In process, will continue to follow  Sherrilyn Rist [phone] 10/02/2015, 10:45 AM

## 2015-10-02 NOTE — Progress Notes (Signed)
    [facility] has been requested to perform a transesophageal echocardiogram on Amy Fischer  for severe mitral regurgitation.  After careful review of history and examination, the risks and benefits of transesophageal echocardiogram have been explained including risks of esophageal damage, perforation (1:10,000 risk), bleeding, pharyngeal hematoma as well as other potential complications associated with conscious sedation including aspiration, arrhythmia, respiratory failure and death. Alternatives to treatment were discussed, questions were answered. Patient is willing to proceed.   Hemoglobin stable at 13.5 and Platelets at 203. BP has ben 99/34 - 123/53 in the past 24 hours with no requirement for pressors.  Erma Heritage, PA-C 10/02/2015 4:27 PM

## 2015-10-02 NOTE — Progress Notes (Addendum)
PROGRESS NOTE  Amy Fischer ZTI:[id number] DOB: June 24, 1929 DOA: 09/30/2015 PCP: [name], MD  Brief history 81 year old female with a history of hypertension, carotid stenosis workup around 3 AM on 09/30/2015 with shortness of breath. The patient was found to be in respiratory distress unable to speak full sentences. EMS was activated and the patient was found to have oxygen saturation of 80%. Initial telemetry revealed atrial fibrillation with RVR. The patient was placed on BiPAP and started on intravenous furosemide with good clinical effect. Chest x-ray revealed pulmonary edema. CT angiogram of the chest was negative for pulmonary embolus. The patient has been complaining of decreased exercise endurance with her activities of daily living for the past week to 2 weeks, but she has denied any chest discomfort. Assessment/Plan: Acute Respiratory Failure with hypoxia and hypercapnea -BiPAP-->weaned to RA over 48 hours -secondary to CHF with underlying COPD -CT angiogram chest--negative for pulmonary embolus but revealed small bilateral pleural effusions with emphysema; interstitial and alveolar edema present Acute Diastolic CHF/ R-heart Failure/Pulmonary HTN -still appears on the hypervolemic side clinically -continue Lasix 20 mg IV bid through today, still has JVD and basilar crackles -10/01/2015 echo--EF 65-70 percent, moderate MR, PAP 66 -consult cardiology-->?ultimately need R-heart cath with mod-severe pulm HTN? -daily weights--NEG 5 pounds since admission -question accuracy of I/Os -continue Bystolic -d/c antibiotics PAF -now back in sinus -CHADSVASc = 6 -start apixaban-pt and family are agreeable -TSH--0.586 Elevated troponin -Secondary to demand ischemia in the setting of diastolic CHF -No symptoms of angina Hyperlipidemia -Continue Crestor Hypothyroidism -Continue Synthroid -TSH 0.586 GERD -continue PPI L-hip pain -xray left hip -no recent  falls  Family Communication: Pt at beside Disposition Plan: Home when medically stable     Procedures/Studies: Ct Angio Chest Pe W/cm &/or Wo Cm  09/30/2015   CLINICAL DATA:  Shortness of breath and decreased oxygen saturation  EXAM: CT ANGIOGRAPHY CHEST WITH CONTRAST  TECHNIQUE: Multidetector CT imaging of the chest was performed using the standard protocol during bolus administration of intravenous contrast. Multiplanar CT image reconstructions and MIPs were obtained to evaluate the vascular anatomy.  CONTRAST:  65mL OMNIPAQUE IOHEXOL 350 MG/ML SOLN  COMPARISON:  Chest radiograph September 30, 2015  FINDINGS: There is no demonstrable pulmonary embolus. There is atherosclerotic change in the thoracic aorta without aneurysm. There is atherosclerotic change in the visualized great vessels without high-grade obstruction appreciable.  There are small pleural effusions bilaterally. There is underlying centrilobular emphysematous change. There is interstitial and patchy alveolar edema bilaterally.  There is reflux of contrast into the inferior vena cava and hepatic veins. Heart is slightly enlarged. There is no appreciable pericardial effusion. There are scattered foci of coronary artery calcification.  The thyroid is diminutive. There is no appreciable thoracic adenopathy. Small mediastinal lymph nodes are present which do not meet size criteria for pathologic significance.  In the visualized upper abdomen, gallbladder is absent. There is atherosclerotic change in the visualized upper abdominal aorta.  There are no blastic or lytic bone lesions. There is degenerative change in the thoracic spine.  Review of the MIP images confirms the above findings.  IMPRESSION: No demonstrable pulmonary embolus. Evidence of congestive heart failure superimposed on emphysematous change.  No demonstrable adenopathy. Scattered foci of coronary artery calcification noted.  Reflux of contrast into the inferior vena cava and  hepatic veins is indicative of increased right heart pressure.   Electronically Signed   By: Lowella Grip III M.D.  On: 09/30/2015 08:24   Dg Chest Port 1 View  09/30/2015   CLINICAL DATA:  Shortness of breath.  EXAM: PORTABLE CHEST 1 VIEW  COMPARISON:  05/14/2015  FINDINGS: Diffuse interstitial opacity with Kerley lines. Symmetric basilar airspace disease. Stable borderline cardiomegaly and aortic tortuosity. Background hyperinflation, likely COPD.  No effusion or air leak.  IMPRESSION: CHF superimposed on COPD.   Electronically Signed   By: Monte Fantasia M.D.   On: 09/30/2015 04:55         Subjective: Patient is breathing better. He has little functional reserve. She is worn out with just taking a shower. Denies any fevers, chills, chest pain, nausea, vomiting, diarrhea, abdominal pain. No dysuria or hematuria. No headache or dizziness.  Objective: Filed Vitals:   10/01/15 1845 10/02/15 0022 10/02/15 0435 10/02/15 0757  BP: 114/46 102/48 108/48 123/53  Pulse: 76 76 73 74  Temp:  98.7 F (37.1 C) 97.8 F (36.6 C) 98.6 F (37 C)  TempSrc:  Oral Oral Oral  Resp:  20 18 18   Height:      Weight:   51.755 kg (114 lb 1.6 oz)   SpO2:  95% 96% 93%    Intake/Output Summary (Last 24 hours) at 10/02/15 1452 Last data filed at 10/02/15 0934  Gross per 24 hour  Intake    820 ml  Output   1405 ml  Net   -585 ml   Weight change: -2.345 kg (-5 lb 2.7 oz) Exam:   General:  Pt is alert, follows commands appropriately, not in acute distress  HEENT: No icterus, No thrush, No neck mass, [place]/AT  Cardiovascular: RRR, S1/S2, no rubs, no gallops +JVD  Respiratory: Bibasilar crackles. No wheezing. Good air movement.  Abdomen: Soft/+BS, non tender, non distended, no guarding  Extremities: No edema, No lymphangitis, No petechiae, No rashes, no synovitis  Data Reviewed: Basic Metabolic Panel:  Recent Labs Lab 09/30/15 0335 09/30/15 1010 10/01/15 0235 10/02/15 0522  NA 137  --   138 137  K 3.9  --  4.1 3.8  CL 106  --  108 100*  CO2 16*  --  23 29  GLUCOSE 328*  --  116* 107*  BUN 15  --  21* 17  CREATININE 1.08* 0.82  0.79 0.89 0.93  CALCIUM 9.0  --  8.6* 8.9  MG  --  2.1  --   --    Liver Function Tests:  Recent Labs Lab 09/30/15 1010 10/01/15 0235  AST 30 21  ALT 20 13*  ALKPHOS 57 50  BILITOT 0.5 0.9  PROT 5.8* 5.8*  ALBUMIN 3.2* 3.0*   No results for input(s): LIPASE, AMYLASE in the last 168 hours. No results for input(s): AMMONIA in the last 168 hours. CBC:  Recent Labs Lab 09/30/15 0335 09/30/15 1010 10/01/15 0235 10/02/15 0522  WBC 14.2* 10.1  10.3 11.1* 9.1  NEUTROABS 5.0  --   --   --   HGB 14.2 13.5  13.5 12.3 12.9  HCT 44.6 41.2  40.9 38.1 38.8  MCV 97.8 94.7  94.5 96.5 95.3  PLT 246 208  203 191 186   Cardiac Enzymes:  Recent Labs Lab 09/30/15 0335 09/30/15 1010 09/30/15 1453 09/30/15 2030  TROPONINI <0.03 0.08* 0.07* 0.04*   BNP: Invalid input(s): POCBNP CBG: No results for input(s): GLUCAP in the last 168 hours.  Recent Results (from the past 240 hour(s))  Blood culture (routine x 2)     Status: None (Preliminary result)  Collection Time: 09/30/15  6:53 AM  Result Value Ref Range Status   Specimen Description BLOOD RIGHT ANTECUBITAL  Final   Special Requests BOTTLES DRAWN AEROBIC AND ANAEROBIC 5CC  Final   Culture NO GROWTH 2 DAYS  Final   Report Status PENDING  Incomplete  Blood culture (routine x 2)     Status: None (Preliminary result)   Collection Time: 09/30/15  6:55 AM  Result Value Ref Range Status   Specimen Description BLOOD RIGHT HAND  Final   Special Requests BOTTLES DRAWN AEROBIC ONLY 3CC  Final   Culture NO GROWTH 2 DAYS  Final   Report Status PENDING  Incomplete  MRSA PCR Screening     Status: None   Collection Time: 09/30/15 10:57 AM  Result Value Ref Range Status   MRSA by PCR NEGATIVE NEGATIVE Final    Comment:        The GeneXpert MRSA Assay (FDA approved for NASAL  specimens only), is one component of a comprehensive MRSA colonization surveillance program. It is not intended to diagnose MRSA infection nor to guide or monitor treatment for MRSA infections.      Scheduled Meds: . antiseptic oral rinse  7 mL Mouth Rinse BID  . enoxaparin (LOVENOX) injection  40 mg Subcutaneous Q24H  . furosemide  20 mg Intravenous Q12H  . levothyroxine  100 mcg Oral QAC breakfast  . nebivolol  2.5 mg Oral Daily  . pantoprazole  40 mg Oral Daily  . rosuvastatin  2.5 mg Oral Daily  . sodium chloride  3 mL Intravenous Q12H  . sodium chloride  3 mL Intravenous Q12H   Continuous Infusions:    Isaias Dowson, DO  [facility] Pager [phone]  If 7PM-7AM, please contact night-coverage [email or web address] Password [email or web address] 10/02/2015, 2:52 PM   LOS: 2 days

## 2015-10-03 ENCOUNTER — Inpatient Hospital Stay (HOSPITAL_COMMUNITY): Payer: Medicare Other

## 2015-10-03 ENCOUNTER — Encounter (HOSPITAL_COMMUNITY)
Admission: EM | Disposition: A | Discharge status: Home Health | Payer: Self-pay | Source: Home / Self Care | Attending: Thoracic Surgery (Cardiothoracic Vascular Surgery)

## 2015-10-03 ENCOUNTER — Other Ambulatory Visit: Payer: Self-pay | Admitting: *Deleted

## 2015-10-03 ENCOUNTER — Encounter (HOSPITAL_COMMUNITY): Payer: Self-pay

## 2015-10-03 DIAGNOSIS — I340 Nonrheumatic mitral (valve) insufficiency: Secondary | ICD-10-CM

## 2015-10-03 DIAGNOSIS — I5033 Acute on chronic diastolic (congestive) heart failure: Secondary | ICD-10-CM

## 2015-10-03 HISTORY — PX: TEE WITHOUT CARDIOVERSION: SHX5443

## 2015-10-03 LAB — C DIFFICILE QUICK SCREEN W PCR REFLEX
C DIFFICLE (CDIFF) ANTIGEN: NEGATIVE
C Diff interpretation: NEGATIVE
C Diff toxin: NEGATIVE

## 2015-10-03 SURGERY — ECHOCARDIOGRAM, TRANSESOPHAGEAL
Anesthesia: Moderate Sedation

## 2015-10-03 MED ORDER — MIDAZOLAM HCL 5 MG/ML IJ SOLN
INTRAMUSCULAR | Status: AC
  Filled 2015-10-03: qty 2

## 2015-10-03 MED ORDER — SODIUM CHLORIDE 0.9 % IV SOLN
INTRAVENOUS | Status: DC
  Administered 2015-10-04: 05:00:00 via INTRAVENOUS

## 2015-10-03 MED ORDER — FENTANYL CITRATE (PF) 100 MCG/2ML IJ SOLN
INTRAMUSCULAR | Status: AC
  Filled 2015-10-03: qty 2

## 2015-10-03 MED ORDER — DIPHENHYDRAMINE HCL 50 MG/ML IJ SOLN
INTRAMUSCULAR | Status: AC
  Filled 2015-10-03: qty 1

## 2015-10-03 MED ORDER — MIDAZOLAM HCL 10 MG/2ML IJ SOLN
INTRAMUSCULAR | Status: DC | PRN
  Administered 2015-10-03 (×2): 2 mg via INTRAVENOUS

## 2015-10-03 MED ORDER — SODIUM CHLORIDE 0.9 % IV SOLN: INTRAVENOUS | Status: DC

## 2015-10-03 MED ORDER — FENTANYL CITRATE (PF) 100 MCG/2ML IJ SOLN
INTRAMUSCULAR | Status: DC | PRN
  Administered 2015-10-03: 25 ug via INTRAVENOUS

## 2015-10-03 MED ORDER — SODIUM CHLORIDE 0.9 % IJ SOLN: 3.0000 mL | INTRAMUSCULAR | Status: DC | PRN

## 2015-10-03 MED ORDER — SODIUM CHLORIDE 0.9 % IV SOLN: 250.0000 mL | INTRAVENOUS | Status: DC | PRN

## 2015-10-03 MED ORDER — HEPARIN SODIUM (PORCINE) 5000 UNIT/ML IJ SOLN
5000.0000 [IU] | Freq: Three times a day (TID) | INTRAMUSCULAR | Status: AC
  Administered 2015-10-03 – 2015-10-09 (×18): 5000 [IU] via SUBCUTANEOUS
  Filled 2015-10-03 (×18): qty 1

## 2015-10-03 MED ORDER — SODIUM CHLORIDE 0.9 % IJ SOLN: 3.0000 mL | Freq: Two times a day (BID) | INTRAMUSCULAR | Status: DC

## 2015-10-03 MED ORDER — FUROSEMIDE 10 MG/ML IJ SOLN: 20.0000 mg | Freq: Two times a day (BID) | INTRAMUSCULAR | Status: DC

## 2015-10-03 NOTE — H&P (View-Only) (Signed)
Reason for Consult:Mitral regurgitation Referring Physician: Dr. Trixie Deis Amy Fischer is an 81 y.o. female.  HPI: 81 yo woman with a past history significant for hypertension, subclavian steal with left carotid- subclavian bypass, arthritis, esophageal spasm, and GERD. She had no prior cardiac history. She presented on 09/30/15 with a cc/o SOB. She had been having mild exertional shortness of breath for about 6 months. She was awakened from her sleep with severe SOB on the night of 10/2. She felt like nothing she did helped the dyspnea. She called 911 and was brought to the ED. A CXR showed pulmonary edema. BNP was 460 Her ECG was read as atrial fib, but may have been ST with PACs. She denied any chest pain during or leading up to the event. Her breathing and HR improved with BIPAP.  A CT was done. There was no PE. She was felt to have increased right heart pressures.  A transthoracic echo on 10/01/2015 showed preserved EF of 65-70% and mild LVH. Moderate mitral regurgitation was noted and the RV was mildly dilated. PA peak pressure was elevated at 66 mm Hg (S). Dr. Stanford Breed later reviewed the echo and felt the MR was severe.  She was diuresed. Her symptoms improved, but she is still short of breath with even minor exertion.  A TEE today showed severe MR with a flail P2 segment with a ruptured chord.  She lives alone. She walks everyday but activities have been more limited following knee surgery 2 years ago. Past Medical History  Diagnosis Date  . GERD (gastroesophageal reflux disease)   . Hyperlipidemia   . Hypothyroidism   . Osteoarthritis   . Osteoporosis   . Episodic recurrent vertigo     MRI Head 2003  . Allergy   . Bladder polyps   . Subclavian steal syndrome     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; CTA 2013 revealed open graft  . Hyperplastic colon polyp   . Esophageal spasm   . History of hepatitis     unknown type  . Asymptomatic carotid artery stenosis     R ICA  40% stenosis on CTA 12/2011.  [name] Anemia   . Cataract     BILATERAL-REMOVED  . Elevated blood pressure 03/25/2011    Bp readings borderline today has hx of elvation  In office and ok at home   Not checke recently   She gfeels was elevated from anxiety    . Diverticulosis   . Intestinal metaplasia of gastric mucosa   . Hepatic hemangioma     Past Surgical History  Procedure Laterality Date  . Appendectomy    . Cholecystectomy    . Carotid-subclavian bypass graft Left 1999    for Ripley steal syndrome  . Rotator cuff repair Left   . Elbow surgery Left   . Cystectomy Left     hand  . Tubal ligation    . Tonsillectomy    . Colonoscopy    . Tear duct probing  07/2014  . Knee surgery Left 2014    Family History  Problem Relation Age of Onset  . Hypertension Mother   . Heart disease Father   . Stroke Mother   . Colon cancer Neg Hx     Social History:  reports that she quit smoking about 24 years ago. She has never used smokeless tobacco. She reports that she drinks about 2.4 oz of alcohol per week. She reports that she does not use illicit drugs.  Allergies:  Allergies  Allergen Reactions  . Codeine Nausea And Vomiting  . Risedronate Sodium     unknown  . Statins     Muscles hurt   . Augmentin [Amoxicillin-Pot Clavulanate] Diarrhea    Not allergic  2014    Medications:  Prior to Admission:  Prescriptions prior to admission  Medication Sig Dispense Refill Last Dose  . aspirin 325 MG tablet Take 325 mg by mouth daily.     09/29/2015 at Unknown time  . azelastine (ASTELIN) 0.1 % nasal spray Place 2 sprays into both nostrils 2 (two) times daily. Use in each nostril as directed (Patient not taking: Reported on 09/30/2015) 30 mL 3 Completed Course at Unknown time  . CRESTOR 5 MG tablet TAKE ONE-HALF (1/2) TABLET DAILY 90 tablet 0 09/29/2015 at Unknown time  . doxycycline (VIBRA-TABS) 100 MG tablet Take 1 tablet (100 mg total) by mouth 2 (two) times daily. (Patient not taking:  Reported on 09/30/2015) 14 tablet 0 Completed Course at Unknown time  . ezetimibe (ZETIA) 10 MG tablet Take 1 tablet (10 mg total) by mouth daily. 30 tablet 0 09/29/2015 at Unknown time  . fish oil-omega-3 fatty acids 1000 MG capsule Take 1 g by mouth daily.    09/29/2015 at Unknown time  . glucosamine-chondroitin 500-400 MG tablet Take 1 tablet by mouth 3 (three) times daily.    09/29/2015 at Unknown time  . levothyroxine (SYNTHROID, LEVOTHROID) 100 MCG tablet TAKE 1 TABLET (100 MCG TOTAL) BY MOUTH DAILY BEFORE BREAKFAST. 90 tablet 1 09/29/2015 at Unknown time  . Melatonin 5 MG CAPS Take 2.5 mg by mouth at bedtime as needed (sleep).    Past Week at Unknown time  . MULTIPLE VITAMIN PO Take 1 tablet by mouth daily. 50+ Senior Vitamin Daily   09/29/2015 at Unknown time  . nebivolol (BYSTOLIC) 2.5 MG tablet Take 1 tablet (2.5 mg total) by mouth daily. 90 tablet 3 Past Week at Unknown time  . omeprazole (PRILOSEC) 40 MG capsule TAKE 1 CAPSULE DAILY 90 capsule 1 Past Week at Unknown time  . Polyethyl Glycol-Propyl Glycol (SYSTANE OP) Place 1 drop into both eyes 3 (three) times daily as needed (dry eyes). Use 1-2 Drops in both eyes   09/29/2015 at Unknown time  . SALINE NASAL SPRAY NA Place 1 spray into both nostrils daily as needed (congestion).    Past Week at Unknown time  . vitamin E 400 UNIT capsule Take 400 Units by mouth daily.   09/29/2015 at Unknown time    Results for orders placed or performed during the hospital encounter of 09/30/15 (from the past 48 hour(s))  Basic metabolic panel     Status: Abnormal   Collection Time: 10/02/15  5:22 AM  Result Value Ref Range   Sodium 137 135 - 145 mmol/L   Potassium 3.8 3.5 - 5.1 mmol/L   Chloride 100 (L) 101 - 111 mmol/L   CO2 29 22 - 32 mmol/L   Glucose, Bld 107 (H) 65 - 99 mg/dL   BUN 17 6 - 20 mg/dL   Creatinine, Ser 0.93 0.44 - 1.00 mg/dL   Calcium 8.9 8.9 - 10.3 mg/dL   GFR calc non Af Amer 55 (L) >60 mL/min   GFR calc Af Amer >60 >60 mL/min     Comment: (NOTE) The eGFR has been calculated using the CKD EPI equation. This calculation has not been validated in all clinical situations. eGFR's persistently <60 mL/min signify possible Chronic Kidney Disease.  Anion gap 8 5 - 15  CBC     Status: None   Collection Time: 10/02/15  5:22 AM  Result Value Ref Range   WBC 9.1 4.0 - 10.5 K/uL   RBC 4.07 3.87 - 5.11 MIL/uL   Hemoglobin 12.9 12.0 - 15.0 g/dL   HCT 38.8 36.0 - 46.0 %   MCV 95.3 78.0 - 100.0 fL   MCH 31.7 26.0 - 34.0 pg   MCHC 33.2 30.0 - 36.0 g/dL   RDW 13.9 11.5 - 15.5 %   Platelets 186 150 - 400 K/uL    Dg Hip Unilat With Pelvis 2-3 Views Left  10/02/2015   CLINICAL DATA:  Acute left hip pain without known injury.  EXAM: DG HIP (WITH OR WITHOUT PELVIS) 2-3V LEFT  COMPARISON:  December 07, 2014.  FINDINGS: There is no evidence of hip fracture or dislocation. No significant degenerative changes seen involving either hip joint. There does appear to be old healed fracture involving the left inferior pubic ramus.  IMPRESSION: Old healed fracture involving the left inferior pubic ramus. Hip joints appear normal.   Electronically Signed   By: Marijo Conception, M.D.   On: 10/02/2015 18:28    Review of Systems  Constitutional: Positive for malaise/fatigue (has been felling tired ~ 6 months). Negative for fever and chills.  HENT:       Saw her dentist a few weeks ago. No acute issues  Eyes: Negative.        Bilateral cataract surgery. Blocked tear duct  Respiratory: Positive for shortness of breath.   Cardiovascular: Positive for palpitations, orthopnea and PND. Negative for chest pain and leg swelling.  Gastrointestinal: Positive for heartburn and diarrhea.  All other systems reviewed and are negative.  Blood pressure 112/40, pulse 80, temperature 97.9 F (36.6 C), temperature source Oral, resp. rate 18, height '5\' 2"'  (1.575 m), weight 112 lb 14 oz (51.2 kg), SpO2 97 %. Physical Exam  Vitals reviewed. Constitutional: She  is oriented to person, place, and time. She appears well-developed and well-nourished. No distress.  HENT:  Head: Normocephalic and atraumatic.  Eyes: Conjunctivae and EOM are normal. No scleral icterus.  Neck: Normal range of motion. Neck supple. No thyromegaly present.  Well healed scar left neck  Cardiovascular: Normal rate and regular rhythm.   Murmur (3/6 holosystolic murmur) heard. Respiratory: Effort normal and breath sounds normal. No respiratory distress. She has no wheezes. She has no rales.  GI: Soft. She exhibits no distension. There is no tenderness.  Musculoskeletal: She exhibits no edema.  Lymphadenopathy:    She has no cervical adenopathy.  Neurological: She is alert and oriented to person, place, and time. No cranial nerve deficit. She exhibits normal muscle tone.  Motor 5/5  Skin: Skin is warm and dry.  Psychiatric: She has a normal mood and affect.   Transthoracic Echocardiogram Study Conclusions  - Left ventricle: The cavity size was normal. Wall thickness was increased in a pattern of mild LVH. Systolic function was vigorous. The estimated ejection fraction was in the range of 65% to 70%. Wall motion was normal; there were no regional wall motion abnormalities. - Aortic valve: Valve area (VTI): 0.98 cm^2. Valve area (Vmax): 1.07 cm^2. Valve area (Vmean): 1.08 cm^2. - Mitral valve: There was moderate regurgitation. - Right ventricle: The cavity size was mildly dilated. - Pulmonary arteries: Systolic pressure was moderately to severely increased. PA peak pressure: 66 mm Hg (S).  TEE Indication: Mitral regurgitation  Findings: Please see  echo section for full report. Normal left ventricular size with mild LV hypertrophy. EF 60-65%. Normal wall motion. Normal RV size and systolic function. Mild to moderate TR. Peak RV-RA gradient 61 mmHg. Aortic valve trileaflet with no stenosis or regurgitation. The mitral valve was thickened/myxomatous. There  was a partial flail P2 segment with possible ruptured chord noted. There was severe, anteriorly-directed mitral regurgitation (unable to do PISA due to eccentricity). There was pulmonary vein systolic flow reversal on PW doppler imaging. The atria appeared normal in size and there was no LAA thrombus. The aorta was normal in caliber with grade III plaque in the descending thoracic aorta.   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 Partial flail of posterior leaflet, P2 segment. Possible chordal rupture.   Amy Fischer 10/03/2015 2:19 PM  I personally reviewed both the TTE and TEE  Assessment/Plan: 81 yo woman presents with acute diastolic heart failure. Work up shows severe MR with a flail posterior (P2) segment. TEE shows an apparent ruptured chord. She will need a mitral repair to correct this issue.  She has known ASCVD with a previous carotid- subclavian bypass and is likely to have some degree of CAD. She is scheduled for cath tomorrow.  Amy Fischer 10/03/2015, 4:35 PM

## 2015-10-03 NOTE — Progress Notes (Signed)
[facility] PROGRESS NOTE  BRADEE COMMON SEG:[id number] DOB: [birth year] DOA: 09/30/2015  PCP: [name], MD  Brief HPI: 81 year old female with a history of hypertension, carotid stenosis workup around 3 AM on 09/30/2015 with shortness of breath. The patient was found to be in respiratory distress unable to speak full sentences. EMS was activated and the patient was found to have oxygen saturation of 80%. Initial telemetry revealed atrial fibrillation with RVR. The patient was placed on BiPAP and started on intravenous furosemide with good clinical effect. Chest x-ray revealed pulmonary edema. CT angiogram of the chest was negative for pulmonary embolus. The patient has been complaining of decreased exercise endurance with her activities of daily living for the past week to 2 weeks, but she has denied any chest discomfort.  Past medical history:  Past Medical History  Diagnosis Date  . GERD (gastroesophageal reflux disease)   . Hyperlipidemia   . Hypothyroidism   . Osteoarthritis   . Osteoporosis   . Episodic recurrent vertigo     MRI Head 2003  . Allergy   . Bladder polyps   . Subclavian steal syndrome     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; CTA 2013 revealed open graft  . Hyperplastic colon polyp   . Esophageal spasm   . History of hepatitis     unknown type  . Asymptomatic carotid artery stenosis     R ICA 40% stenosis on CTA 12/2011.  [name] Anemia   . Cataract     BILATERAL-REMOVED  . Elevated blood pressure 03/25/2011    Bp readings borderline today has hx of elvation  In office and ok at home   Not checke recently   She gfeels was elevated from anxiety    . Diverticulosis   . Intestinal metaplasia of gastric mucosa   . Hepatic hemangioma     Consultants: Cardiology  Procedures:  Echocardiogram Study Conclusions  - Left ventricle: The cavity size was normal. Wall thickness was increased in a pattern of mild LVH. Systolic function  was vigorous. The estimated ejection fraction was in the range of 65% to 70%. Wall motion was normal; there were no regional wall motion abnormalities. - Aortic valve: Valve area (VTI): 0.98 cm^2. Valve area (Vmax): 1.07 cm^2. Valve area (Vmean): 1.08 cm^2. - Mitral valve: There was moderate regurgitation. - Right ventricle: The cavity size was mildly dilated. - Pulmonary arteries: Systolic pressure was moderately to severely increased. PA peak pressure: 66 mm Hg (S).  TEE scheduled for a 10/5  Right and left heart catheterization scheduled for 10/6  Antibiotics: none  Subjective: Patient feels much better this morning. Breathing is much improved. Denies any chest nausea, vomiting.  Objective: Vital Signs  Filed Vitals:   10/02/15 0757 10/02/15 1516 10/02/15 2228 10/03/15 0443  BP: 123/53 118/49 126/50 106/44  Pulse: 74  83 70  Temp: 98.6 F (37 C)  98 F (36.7 C) 99.3 F (37.4 C)  TempSrc: Oral  Oral Oral  Resp: 18  18 18   Height:      Weight:    51.2 kg (112 lb 14 oz)  SpO2: 93%  99% 96%    Intake/Output Summary (Last 24 hours) at 10/03/15 0958 Last data filed at 10/03/15 0755  Gross per 24 hour  Intake    240 ml  Output    500 ml  Net   -260 ml   Filed Weights   09/30/15 1341 10/02/15 0435 10/03/15 0443  Weight: 54.1 kg (  119 lb 4.3 oz) 51.755 kg (114 lb 1.6 oz) 51.2 kg (112 lb 14 oz)    General appearance: alert, cooperative, appears stated age and no distress Resp: few crackles at the bases. Mostly clear to auscultation. Cardio: regular rate and rhythm, S1, S2 normal, no murmur, click, rub or gallop GI: soft, non-tender; bowel sounds normal; no masses,  no organomegaly Neurologic: alert and oriented 3. No focal neurological deficits are noted.  Lab Results:  Basic Metabolic Panel:  Recent Labs Lab 09/30/15 0335 09/30/15 1010 10/01/15 0235 10/02/15 0522  NA 137  --  138 137  K 3.9  --  4.1 3.8  CL 106  --  108 100*  CO2 16*  --  23  29  GLUCOSE 328*  --  116* 107*  BUN 15  --  21* 17  CREATININE 1.08* 0.82  0.79 0.89 0.93  CALCIUM 9.0  --  8.6* 8.9  MG  --  2.1  --   --    Liver Function Tests:  Recent Labs Lab 09/30/15 1010 10/01/15 0235  AST 30 21  ALT 20 13*  ALKPHOS 57 50  BILITOT 0.5 0.9  PROT 5.8* 5.8*  ALBUMIN 3.2* 3.0*   CBC:  Recent Labs Lab 09/30/15 0335 09/30/15 1010 10/01/15 0235 10/02/15 0522  WBC 14.2* 10.1  10.3 11.1* 9.1  NEUTROABS 5.0  --   --   --   HGB 14.2 13.5  13.5 12.3 12.9  HCT 44.6 41.2  40.9 38.1 38.8  MCV 97.8 94.7  94.5 96.5 95.3  PLT 246 208  203 191 186   Cardiac Enzymes:  Recent Labs Lab 09/30/15 0335 09/30/15 1010 09/30/15 1453 09/30/15 2030  TROPONINI <0.03 0.08* 0.07* 0.04*   BNP (last 3 results)  Recent Labs  09/30/15 0335  BNP 460.6*    Recent Results (from the past 240 hour(s))  Blood culture (routine x 2)     Status: None (Preliminary result)   Collection Time: 09/30/15  6:53 AM  Result Value Ref Range Status   Specimen Description BLOOD RIGHT ANTECUBITAL  Final   Special Requests BOTTLES DRAWN AEROBIC AND ANAEROBIC 5CC  Final   Culture NO GROWTH 2 DAYS  Final   Report Status PENDING  Incomplete  Blood culture (routine x 2)     Status: None (Preliminary result)   Collection Time: 09/30/15  6:55 AM  Result Value Ref Range Status   Specimen Description BLOOD RIGHT HAND  Final   Special Requests BOTTLES DRAWN AEROBIC ONLY 3CC  Final   Culture NO GROWTH 2 DAYS  Final   Report Status PENDING  Incomplete  MRSA PCR Screening     Status: None   Collection Time: 09/30/15 10:57 AM  Result Value Ref Range Status   MRSA by PCR NEGATIVE NEGATIVE Final    Comment:        The GeneXpert MRSA Assay (FDA approved for NASAL specimens only), is one component of a comprehensive MRSA colonization surveillance program. It is not intended to diagnose MRSA infection nor to guide or monitor treatment for MRSA infections.        Studies/Results: Dg Hip Unilat With Pelvis 2-3 Views Left  10/02/2015   CLINICAL DATA:  Acute left hip pain without known injury.  EXAM: DG HIP (WITH OR WITHOUT PELVIS) 2-3V LEFT  COMPARISON:  December 07, 2014.  FINDINGS: There is no evidence of hip fracture or dislocation. No significant degenerative changes seen involving either hip joint. There does appear to  be old healed fracture involving the left inferior pubic ramus.  IMPRESSION: Old healed fracture involving the left inferior pubic ramus. Hip joints appear normal.   Electronically Signed   By: Marijo Conception, M.D.   On: 10/02/2015 18:28    Medications:  Scheduled: . antiseptic oral rinse  7 mL Mouth Rinse BID  . aspirin EC  81 mg Oral Daily  . furosemide  20 mg Intravenous Q12H  . [START ON 10/04/2015] furosemide  20 mg Intravenous Q12H  . heparin subcutaneous  5,000 Units Subcutaneous 3 times per day  . levothyroxine  100 mcg Oral QAC breakfast  . nebivolol  2.5 mg Oral Daily  . pantoprazole  40 mg Oral Daily  . rosuvastatin  2.5 mg Oral Daily  . sodium chloride  3 mL Intravenous Q12H  . sodium chloride  3 mL Intravenous Q12H   Continuous: . sodium chloride     TFT:[id number] **OR** acetaminophen, ondansetron **OR** ondansetron (ZOFRAN) IV  Assessment/Plan:  Principal Problem:   Respiratory distress Active Problems:   Hypothyroidism   GERD   Hypertension, isolated systolic   Acute on chronic diastolic congestive heart failure, NYHA class 3 (HCC)   Paroxysmal atrial fibrillation (HCC)   Acute respiratory failure with hypoxia and hypercapnia (HCC)   Mitral regurgitation    Acute Respiratory Failure with hypoxia and hypercapnea -BiPAP-->weaned to RA over 48 hours -this is thought to be secondary to CHF with underlying COPD -CT angiogram chest--negative for pulmonary embolus but revealed small bilateral pleural effusions with emphysema; interstitial and alveolar edema present  Acute Diastolic CHF/ R-heart  Failure/Pulmonary HTN -patient has significantly improved. Continue with IV Lasix. -10/01/2015 echo--EF 65-70 percent, moderate MR, PAP 66 -cardiology was consulted. They suspect that her degree of mitral regurgitation is worse than what appears on the echocardiogram. TEE has been ordered. Vent will be done today. Plan is for left and right heart catheterization tomorrow with possibility of mitral valve replacement if she indeed has severe MR. -she continues to be in negative fluid balance. Continues to be losing weight due to diuresis. -continue Bystolic - as initially started on antibiotics, which have been discontinued.  Questionable arrhythmia -Initially thought to be in atrial fibrillation, however, cardiology does not think that this was the case. Anticoagulation has been discontinued for now. Will defer to cardiology in this matter. TSH--0.586  Elevated troponin -Secondary to demand ischemia in the setting of diastolic CHF -No symptoms of angina  Hyperlipidemia -Continue Crestor  Hypothyroidism -Continue Synthroid -TSH 0.586  GERD -continue PPI  L-hip pain -xray left hip is unremarkable. Continue analgesic agents.  DVT Prophylaxis: cutaneous heparin    Code Status: full code  Family Communication: discussed with patient  Disposition Plan: await further cardiac testing.    LOS: 3 days   [facility] Pager [phone] 10/03/2015, 9:58 AM  If 7PM-7AM, please contact night-coverage at [email or web address], password [facility]

## 2015-10-03 NOTE — CV Procedure (Signed)
Procedure: TEE  Sedation: Versed 4 mg IV, Fentanyl 25 mcg IV  Indication: Mitral regurgitation  Findings: Please see echo section for full report.  Normal left ventricular size with mild LV hypertrophy.  EF 60-65%.  Normal wall motion.  Normal RV size and systolic function.  Mild to moderate TR.  Peak RV-RA gradient 61 mmHg.  Aortic valve trileaflet with no stenosis or regurgitation.  The mitral valve was thickened/myxomatous.  There was a partial flail P2 segment with possible ruptured chord noted.  There was severe, anteriorly-directed mitral regurgitation (unable to do PISA due to eccentricity).  There was pulmonary vein systolic flow reversal on PW doppler imaging.  The atria appeared normal in size and there was no LAA thrombus.  The aorta was normal in caliber with grade III plaque in the descending thoracic aorta.   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  Partial flail of posterior leaflet, P2 segment.  Possible chordal rupture.   Amy Fischer 10/03/2015 2:19 PM

## 2015-10-03 NOTE — Interval H&P Note (Signed)
History and Physical Interval Note:  10/03/2015 1:57 PM  Amy Fischer  has presented today for surgery, with the diagnosis of SEVERE Mitral Regurgitation                                                                   Mitral Regurgitation                                       The various methods of treatment have been discussed with the patient and family. After consideration of risks, benefits and other options for treatment, the patient has consented to  Procedure(s): TRANSESOPHAGEAL ECHOCARDIOGRAM (TEE) (N/A) as a surgical intervention .  The patient's history has been reviewed, patient examined, no change in status, stable for surgery.  I have reviewed the patient's chart and labs.  Questions were answered to the patient's satisfaction.     [place] [employer]

## 2015-10-03 NOTE — Consult Note (Signed)
Reason for Consult:Mitral regurgitation Referring Physician: Dr. Trixie Deis Amy Fischer is an 81 y.o. female.  HPI: 81 yo woman with a past history significant for hypertension, subclavian steal with left carotid- subclavian bypass, arthritis, esophageal spasm, and GERD. She had no prior cardiac history. She presented on 09/30/15 with a cc/o SOB. She had been having mild exertional shortness of breath for about 6 months. She was awakened from her sleep with severe SOB on the night of 10/2. She felt like nothing she did helped the dyspnea. She called 911 and was brought to the ED. A CXR showed pulmonary edema. BNP was 460 Her ECG was read as atrial fib, but may have been ST with PACs. She denied any chest pain during or leading up to the event. Her breathing and HR improved with BIPAP.  A CT was done. There was no PE. She was felt to have increased right heart pressures.  A transthoracic echo on 10/01/2015 showed preserved EF of 65-70% and mild LVH. Moderate mitral regurgitation was noted and the RV was mildly dilated. PA peak pressure was elevated at 66 mm Hg (S). Dr. Stanford Breed later reviewed the echo and felt the MR was severe.  She was diuresed. Her symptoms improved, but she is still short of breath with even minor exertion.  A TEE today showed severe MR with a flail P2 segment with a ruptured chord.  She lives alone. She walks everyday but activities have been more limited following knee surgery 2 years ago. Past Medical History  Diagnosis Date  . GERD (gastroesophageal reflux disease)   . Hyperlipidemia   . Hypothyroidism   . Osteoarthritis   . Osteoporosis   . Episodic recurrent vertigo     MRI Head 2003  . Allergy   . Bladder polyps   . Subclavian steal syndrome     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; CTA 2013 revealed open graft  . Hyperplastic colon polyp   . Esophageal spasm   . History of hepatitis     unknown type  . Asymptomatic carotid artery stenosis     R ICA  40% stenosis on CTA 12/2011.  [name] Anemia   . Cataract     BILATERAL-REMOVED  . Elevated blood pressure 03/25/2011    Bp readings borderline today has hx of elvation  In office and ok at home   Not checke recently   She gfeels was elevated from anxiety    . Diverticulosis   . Intestinal metaplasia of gastric mucosa   . Hepatic hemangioma     Past Surgical History  Procedure Laterality Date  . Appendectomy    . Cholecystectomy    . Carotid-subclavian bypass graft Left 1999    for Brutus steal syndrome  . Rotator cuff repair Left   . Elbow surgery Left   . Cystectomy Left     hand  . Tubal ligation    . Tonsillectomy    . Colonoscopy    . Tear duct probing  07/2014  . Knee surgery Left 2014    Family History  Problem Relation Age of Onset  . Hypertension Mother   . Heart disease Father   . Stroke Mother   . Colon cancer Neg Hx     Social History:  reports that she quit smoking about 24 years ago. She has never used smokeless tobacco. She reports that she drinks about 2.4 oz of alcohol per week. She reports that she does not use illicit drugs.  Allergies:  Allergies  Allergen Reactions  . Codeine Nausea And Vomiting  . Risedronate Sodium     unknown  . Statins     Muscles hurt   . Augmentin [Amoxicillin-Pot Clavulanate] Diarrhea    Not allergic  2014    Medications:  Prior to Admission:  Prescriptions prior to admission  Medication Sig Dispense Refill Last Dose  . aspirin 325 MG tablet Take 325 mg by mouth daily.     09/29/2015 at Unknown time  . azelastine (ASTELIN) 0.1 % nasal spray Place 2 sprays into both nostrils 2 (two) times daily. Use in each nostril as directed (Patient not taking: Reported on 09/30/2015) 30 mL 3 Completed Course at Unknown time  . CRESTOR 5 MG tablet TAKE ONE-HALF (1/2) TABLET DAILY 90 tablet 0 09/29/2015 at Unknown time  . doxycycline (VIBRA-TABS) 100 MG tablet Take 1 tablet (100 mg total) by mouth 2 (two) times daily. (Patient not taking:  Reported on 09/30/2015) 14 tablet 0 Completed Course at Unknown time  . ezetimibe (ZETIA) 10 MG tablet Take 1 tablet (10 mg total) by mouth daily. 30 tablet 0 09/29/2015 at Unknown time  . fish oil-omega-3 fatty acids 1000 MG capsule Take 1 g by mouth daily.    09/29/2015 at Unknown time  . glucosamine-chondroitin 500-400 MG tablet Take 1 tablet by mouth 3 (three) times daily.    09/29/2015 at Unknown time  . levothyroxine (SYNTHROID, LEVOTHROID) 100 MCG tablet TAKE 1 TABLET (100 MCG TOTAL) BY MOUTH DAILY BEFORE BREAKFAST. 90 tablet 1 09/29/2015 at Unknown time  . Melatonin 5 MG CAPS Take 2.5 mg by mouth at bedtime as needed (sleep).    Past Week at Unknown time  . MULTIPLE VITAMIN PO Take 1 tablet by mouth daily. 50+ Senior Vitamin Daily   09/29/2015 at Unknown time  . nebivolol (BYSTOLIC) 2.5 MG tablet Take 1 tablet (2.5 mg total) by mouth daily. 90 tablet 3 Past Week at Unknown time  . omeprazole (PRILOSEC) 40 MG capsule TAKE 1 CAPSULE DAILY 90 capsule 1 Past Week at Unknown time  . Polyethyl Glycol-Propyl Glycol (SYSTANE OP) Place 1 drop into both eyes 3 (three) times daily as needed (dry eyes). Use 1-2 Drops in both eyes   09/29/2015 at Unknown time  . SALINE NASAL SPRAY NA Place 1 spray into both nostrils daily as needed (congestion).    Past Week at Unknown time  . vitamin E 400 UNIT capsule Take 400 Units by mouth daily.   09/29/2015 at Unknown time    Results for orders placed or performed during the hospital encounter of 09/30/15 (from the past 48 hour(s))  Basic metabolic panel     Status: Abnormal   Collection Time: 10/02/15  5:22 AM  Result Value Ref Range   Sodium 137 135 - 145 mmol/L   Potassium 3.8 3.5 - 5.1 mmol/L   Chloride 100 (L) 101 - 111 mmol/L   CO2 29 22 - 32 mmol/L   Glucose, Bld 107 (H) 65 - 99 mg/dL   BUN 17 6 - 20 mg/dL   Creatinine, Ser 0.93 0.44 - 1.00 mg/dL   Calcium 8.9 8.9 - 10.3 mg/dL   GFR calc non Af Amer 55 (L) >60 mL/min   GFR calc Af Amer >60 >60 mL/min     Comment: (NOTE) The eGFR has been calculated using the CKD EPI equation. This calculation has not been validated in all clinical situations. eGFR's persistently <60 mL/min signify possible Chronic Kidney Disease.  Anion gap 8 5 - 15  CBC     Status: None   Collection Time: 10/02/15  5:22 AM  Result Value Ref Range   WBC 9.1 4.0 - 10.5 K/uL   RBC 4.07 3.87 - 5.11 MIL/uL   Hemoglobin 12.9 12.0 - 15.0 g/dL   HCT 38.8 36.0 - 46.0 %   MCV 95.3 78.0 - 100.0 fL   MCH 31.7 26.0 - 34.0 pg   MCHC 33.2 30.0 - 36.0 g/dL   RDW 13.9 11.5 - 15.5 %   Platelets 186 150 - 400 K/uL    Dg Hip Unilat With Pelvis 2-3 Views Left  10/02/2015   CLINICAL DATA:  Acute left hip pain without known injury.  EXAM: DG HIP (WITH OR WITHOUT PELVIS) 2-3V LEFT  COMPARISON:  December 07, 2014.  FINDINGS: There is no evidence of hip fracture or dislocation. No significant degenerative changes seen involving either hip joint. There does appear to be old healed fracture involving the left inferior pubic ramus.  IMPRESSION: Old healed fracture involving the left inferior pubic ramus. Hip joints appear normal.   Electronically Signed   By: Marijo Conception, M.D.   On: 10/02/2015 18:28    Review of Systems  Constitutional: Positive for malaise/fatigue (has been felling tired ~ 6 months). Negative for fever and chills.  HENT:       Saw her dentist a few weeks ago. No acute issues  Eyes: Negative.        Bilateral cataract surgery. Blocked tear duct  Respiratory: Positive for shortness of breath.   Cardiovascular: Positive for palpitations, orthopnea and PND. Negative for chest pain and leg swelling.  Gastrointestinal: Positive for heartburn and diarrhea.  All other systems reviewed and are negative.  Blood pressure 112/40, pulse 80, temperature 97.9 F (36.6 C), temperature source Oral, resp. rate 18, height '5\' 2"'  (1.575 m), weight 112 lb 14 oz (51.2 kg), SpO2 97 %. Physical Exam  Vitals reviewed. Constitutional: She  is oriented to person, place, and time. She appears well-developed and well-nourished. No distress.  HENT:  Head: Normocephalic and atraumatic.  Eyes: Conjunctivae and EOM are normal. No scleral icterus.  Neck: Normal range of motion. Neck supple. No thyromegaly present.  Well healed scar left neck  Cardiovascular: Normal rate and regular rhythm.   Murmur (3/6 holosystolic murmur) heard. Respiratory: Effort normal and breath sounds normal. No respiratory distress. She has no wheezes. She has no rales.  GI: Soft. She exhibits no distension. There is no tenderness.  Musculoskeletal: She exhibits no edema.  Lymphadenopathy:    She has no cervical adenopathy.  Neurological: She is alert and oriented to person, place, and time. No cranial nerve deficit. She exhibits normal muscle tone.  Motor 5/5  Skin: Skin is warm and dry.  Psychiatric: She has a normal mood and affect.   Transthoracic Echocardiogram Study Conclusions  - Left ventricle: The cavity size was normal. Wall thickness was increased in a pattern of mild LVH. Systolic function was vigorous. The estimated ejection fraction was in the range of 65% to 70%. Wall motion was normal; there were no regional wall motion abnormalities. - Aortic valve: Valve area (VTI): 0.98 cm^2. Valve area (Vmax): 1.07 cm^2. Valve area (Vmean): 1.08 cm^2. - Mitral valve: There was moderate regurgitation. - Right ventricle: The cavity size was mildly dilated. - Pulmonary arteries: Systolic pressure was moderately to severely increased. PA peak pressure: 66 mm Hg (S).  TEE Indication: Mitral regurgitation  Findings: Please see  echo section for full report. Normal left ventricular size with mild LV hypertrophy. EF 60-65%. Normal wall motion. Normal RV size and systolic function. Mild to moderate TR. Peak RV-RA gradient 61 mmHg. Aortic valve trileaflet with no stenosis or regurgitation. The mitral valve was thickened/myxomatous. There  was a partial flail P2 segment with possible ruptured chord noted. There was severe, anteriorly-directed mitral regurgitation (unable to do PISA due to eccentricity). There was pulmonary vein systolic flow reversal on PW doppler imaging. The atria appeared normal in size and there was no LAA thrombus. The aorta was normal in caliber with grade III plaque in the descending thoracic aorta.   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 Partial flail of posterior leaflet, P2 segment. Possible chordal rupture.   Loralie Champagne 10/03/2015 2:19 PM  I personally reviewed both the TTE and TEE  Assessment/Plan: 81 yo woman presents with acute diastolic heart failure. Work up shows severe MR with a flail posterior (P2) segment. TEE shows an apparent ruptured chord. She will need a mitral repair to correct this issue.  She has known ASCVD with a previous carotid- subclavian bypass and is likely to have some degree of CAD. She is scheduled for cath tomorrow.  [name] 10/03/2015, 4:35 PM

## 2015-10-03 NOTE — Progress Notes (Signed)
PT Cancellation Note  Patient Details Name: Amy Fischer MRN: [id number] DOB: 07/10/1929   Cancelled Treatment:    Reason Eval/Treat Not Completed: Patient at procedure or test/unavailable; patient down for TEE and currently on strict bedrest.  Will attempt again when cleared for OOB mobility.  WYNN,CYNDI 10/03/2015, 8:48 AM  [name], PT [phone] 10/03/2015

## 2015-10-03 NOTE — H&P (View-Only) (Signed)
Patient Name: Amy Fischer Date of Encounter: 10/03/2015  Principal Problem:   Respiratory distress Active Problems:   Hypothyroidism   GERD   Hypertension, isolated systolic   Acute on chronic diastolic congestive heart failure, NYHA class 3 (HCC)   Paroxysmal atrial fibrillation (HCC)   Acute respiratory failure with hypoxia and hypercapnia (HCC)   Mitral regurgitation   Primary Cardiologist: New - Dr. Stanford Breed Patient Profile: 81 y.o. female with PMH of HLD, Hypothyroidism, [name] who presented to [facility] on 09/30/2015 for shortness of breath. Found to have acute diastolic CHF likely secondary to severe MR. TEE scheduled for 10/03/2015 and RHC/LHC on 10/04/2015.  SUBJECTIVE: Reports her breathing has improved. Anxious about her procedure today. Denies any chest pain or palpitations. Reports her eyes have been "itchy" and is using Systane drops as needed.  OBJECTIVE Filed Vitals:   10/02/15 0757 10/02/15 1516 10/02/15 2228 10/03/15 0443  BP: 123/53 118/49 126/50 106/44  Pulse: 74  83 70  Temp: 98.6 F (37 C)  98 F (36.7 C) 99.3 F (37.4 C)  TempSrc: Oral  Oral Oral  Resp: 18  18 18   Height:      Weight:    112 lb 14 oz (51.2 kg)  SpO2: 93%  99% 96%    Intake/Output Summary (Last 24 hours) at 10/03/15 0905 Last data filed at 10/03/15 0755  Gross per 24 hour  Intake    580 ml  Output    500 ml  Net     80 ml   Filed Weights   09/30/15 1341 10/02/15 0435 10/03/15 0443  Weight: 119 lb 4.3 oz (54.1 kg) 114 lb 1.6 oz (51.755 kg) 112 lb 14 oz (51.2 kg)    PHYSICAL EXAM General: Well developed, well nourished, female in no acute distress. Head: Normocephalic, atraumatic.  Neck: Supple without bruits, JVD elevated. Lungs:  Resp regular and unlabored, Minimal rales at bases. Heart: RRR, S1, S2, no S3, S4, 3/6 SEM; no rub. Abdomen: Soft, non-tender, non-distended with normoactive bowel sounds. No hepatomegaly. No rebound/guarding. No  obvious abdominal masses. Extremities: No clubbing, cyanosis, or edema. Distal pedal pulses are 2+ bilaterally. Neuro: Alert and oriented X 3. Moves all extremities spontaneously. Psych: Normal affect.   LABS: CBC: Recent Labs  10/01/15 0235 10/02/15 0522  WBC 11.1* 9.1  HGB 12.3 12.9  HCT 38.1 38.8  MCV 96.5 95.3  PLT 191 938   Basic Metabolic Panel: Recent Labs  09/30/15 1010 10/01/15 0235 10/02/15 0522  NA  --  138 137  K  --  4.1 3.8  CL  --  108 100*  CO2  --  23 29  GLUCOSE  --  116* 107*  BUN  --  21* 17  CREATININE 0.82  0.79 0.89 0.93  CALCIUM  --  8.6* 8.9  MG 2.1  --   --    Liver Function Tests: Recent Labs  09/30/15 1010 10/01/15 0235  AST 30 21  ALT 20 13*  ALKPHOS 57 50  BILITOT 0.5 0.9  PROT 5.8* 5.8*  ALBUMIN 3.2* 3.0*   Cardiac Enzymes: Recent Labs  09/30/15 1010 09/30/15 1453 09/30/15 2030  TROPONINI 0.08* 0.07* 0.04*   BNP:  B NATRIURETIC PEPTIDE  Date/Time Value Ref Range Status  09/30/2015 03:35 AM 460.6* 0.0 - 100.0 pg/mL Final   Hemoglobin A1C: Recent Labs  09/30/15 1010  HGBA1C 5.7*  Thyroid Function Tests: Recent Labs  09/30/15 1010  TSH 0.586  TELE:  NSR with rate in 70's - 80's. No atopic events or evidence of atrial fibrillation.       ECHO: 10/01/2015 Study Conclusions - Left ventricle: The cavity size was normal. Wall thickness was increased in a pattern of mild LVH. Systolic function was vigorous. The estimated ejection fraction was in the range of 65% to 70%. Wall motion was normal; there were no regional wall motion abnormalities. - Aortic valve: Valve area (VTI): 0.98 cm^2. Valve area (Vmax): 1.07 cm^2. Valve area (Vmean): 1.08 cm^2. - Mitral valve: There was moderate regurgitation. - Right ventricle: The cavity size was mildly dilated. - Pulmonary arteries: Systolic pressure was moderately to severely increased. PA peak pressure: 66 mm Hg (S).  Current Medications:  . antiseptic  oral rinse  7 mL Mouth Rinse BID  . aspirin EC  81 mg Oral Daily  . furosemide  20 mg Intravenous Q12H  . levothyroxine  100 mcg Oral QAC breakfast  . nebivolol  2.5 mg Oral Daily  . pantoprazole  40 mg Oral Daily  . rosuvastatin  2.5 mg Oral Daily  . sodium chloride  3 mL Intravenous Q12H  . sodium chloride  3 mL Intravenous Q12H      ASSESSMENT AND PLAN: 1. Acute Diastolic CHF - CXR was obtained showing diffuse interstitial opacity with [name] lines, symmetric basilar airspace disease, and stable borderline cardiomegaly and aortic tortuosity. CT imaging showed congestive heart failure superimposed on emphysematous change with reflux of contrast into the inferior vena cava and hepatic veins was noted and is indicative of increased right heart pressure.  - BNP elevated to 460. Patient's baseline weight is reported as 114lbs on her home scales. Is at 112lbs currently, down 3lbs since admission.  - Echo showed preserved EF of 65-70% with mild LVH, moderate MR, and PA peak pressure of 66. - Has been receiving Lasix 20mg  IV BID. Net output of 855mL. Has symptomatically improved but has continuing elevated JVD and minimal rales. Can likely switch to PO Lasix in the next 24 hours.  2. Severe MR - in reviewing the echocardiogram, Dr. Stanford Breed makes note the patient has severe MR with prolapse of the posterior mitral valve leaflet. - scheduled for TEE on 10/03/2015 at 1300 with Dr. Aundra Dubin. Consent has been fully documented in previous note. Planning to undergo RHC and LHC on 10/04/2015 which is tentatively scheduled with Dr. Tamala Julian at 75. - Dr. Roxan Hockey with [facility] has examined her case and suspects she will need mitral repair with possible CABG pending results of cardiac cath.  3. Acute Respiratory Failure - was requiring BiPAP at admission. Now currently on Room Air. - was initially started on Vancomycin and Zosyn due to acute respiratory failure and elevated WBC. These have now been  discontinued.  4. HLD - continue statin therapy  Signed, Erma Heritage , PA-C 9:05 AM 10/03/2015 Pager: [phone] As above, patient seen and examined. Patient noted dyspnea with bathing this morning. No chest pain. Plan as outlined previously. Her transthoracic echocardiogram shows prolapse of the posterior mitral valve leaflet with severe eccentric mitral regurgitation. Plan for transesophageal echocardiogram today to better assess mitral valve morphology and severity of mitral regurgitation. Plan right and left cardiac catheterization tomorrow. The risks and benefits were discussed and she agrees to proceed. She will ultimately need mitral valve repair +- coronary artery bypass and graft. Continue present dose of Lasix. Hold tomorrow morning prior to catheterization. Kirk Ruths

## 2015-10-03 NOTE — Interval H&P Note (Signed)
Cath Lab Visit (complete for each Cath Lab visit)  Clinical Evaluation Leading to the Procedure:   ACS: No.  Non-ACS:    Anginal Classification: CCS III  Anti-ischemic medical therapy: Maximal Therapy (2 or more classes of medications)  Non-Invasive Test Results: No non-invasive testing performed  Prior CABG: No previous CABG      History and Physical Interval Note:  10/03/2015 8:34 PM  Amy Fischer  has presented today for surgery, with the diagnosis of severe mitrial regurgitation  The various methods of treatment have been discussed with the patient and family. After consideration of risks, benefits and other options for treatment, the patient has consented to  Procedure(s): Right/Left Heart Cath and Coronary Angiography (N/A) as a surgical intervention .  The patient's history has been reviewed, patient examined, no change in status, stable for surgery.  I have reviewed the patient's chart and labs.  Questions were answered to the patient's satisfaction.     Sinclair Grooms

## 2015-10-03 NOTE — Progress Notes (Signed)
Patient Name: Amy Fischer Date of Encounter: 10/03/2015  Principal Problem:   Respiratory distress Active Problems:   Hypothyroidism   GERD   Hypertension, isolated systolic   Acute on chronic diastolic congestive heart failure, NYHA class 3 (HCC)   Paroxysmal atrial fibrillation (HCC)   Acute respiratory failure with hypoxia and hypercapnia (HCC)   Mitral regurgitation   Primary Cardiologist: New - Dr. Stanford Breed Patient Profile: 81 y.o. female with PMH of HLD, Hypothyroidism, [name] who presented to [facility] on 09/30/2015 for shortness of breath. Found to have acute diastolic CHF likely secondary to severe MR. TEE scheduled for 10/03/2015 and RHC/LHC on 10/04/2015.  SUBJECTIVE: Reports her breathing has improved. Anxious about her procedure today. Denies any chest pain or palpitations. Reports her eyes have been "itchy" and is using Systane drops as needed.  OBJECTIVE Filed Vitals:   10/02/15 0757 10/02/15 1516 10/02/15 2228 10/03/15 0443  BP: 123/53 118/49 126/50 106/44  Pulse: 74  83 70  Temp: 98.6 F (37 C)  98 F (36.7 C) 99.3 F (37.4 C)  TempSrc: Oral  Oral Oral  Resp: 18  18 18   Height:      Weight:    112 lb 14 oz (51.2 kg)  SpO2: 93%  99% 96%    Intake/Output Summary (Last 24 hours) at 10/03/15 0905 Last data filed at 10/03/15 0755  Gross per 24 hour  Intake    580 ml  Output    500 ml  Net     80 ml   Filed Weights   09/30/15 1341 10/02/15 0435 10/03/15 0443  Weight: 119 lb 4.3 oz (54.1 kg) 114 lb 1.6 oz (51.755 kg) 112 lb 14 oz (51.2 kg)    PHYSICAL EXAM General: Well developed, well nourished, female in no acute distress. Head: Normocephalic, atraumatic.  Neck: Supple without bruits, JVD elevated. Lungs:  Resp regular and unlabored, Minimal rales at bases. Heart: RRR, S1, S2, no S3, S4, 3/6 SEM; no rub. Abdomen: Soft, non-tender, non-distended with normoactive bowel sounds. No hepatomegaly. No rebound/guarding. No  obvious abdominal masses. Extremities: No clubbing, cyanosis, or edema. Distal pedal pulses are 2+ bilaterally. Neuro: Alert and oriented X 3. Moves all extremities spontaneously. Psych: Normal affect.   LABS: CBC: Recent Labs  10/01/15 0235 10/02/15 0522  WBC 11.1* 9.1  HGB 12.3 12.9  HCT 38.1 38.8  MCV 96.5 95.3  PLT 191 732   Basic Metabolic Panel: Recent Labs  09/30/15 1010 10/01/15 0235 10/02/15 0522  NA  --  138 137  K  --  4.1 3.8  CL  --  108 100*  CO2  --  23 29  GLUCOSE  --  116* 107*  BUN  --  21* 17  CREATININE 0.82  0.79 0.89 0.93  CALCIUM  --  8.6* 8.9  MG 2.1  --   --    Liver Function Tests: Recent Labs  09/30/15 1010 10/01/15 0235  AST 30 21  ALT 20 13*  ALKPHOS 57 50  BILITOT 0.5 0.9  PROT 5.8* 5.8*  ALBUMIN 3.2* 3.0*   Cardiac Enzymes: Recent Labs  09/30/15 1010 09/30/15 1453 09/30/15 2030  TROPONINI 0.08* 0.07* 0.04*   BNP:  B NATRIURETIC PEPTIDE  Date/Time Value Ref Range Status  09/30/2015 03:35 AM 460.6* 0.0 - 100.0 pg/mL Final   Hemoglobin A1C: Recent Labs  09/30/15 1010  HGBA1C 5.7*  Thyroid Function Tests: Recent Labs  09/30/15 1010  TSH 0.586  TELE:  NSR with rate in 70's - 80's. No atopic events or evidence of atrial fibrillation.       ECHO: 10/01/2015 Study Conclusions - Left ventricle: The cavity size was normal. Wall thickness was increased in a pattern of mild LVH. Systolic function was vigorous. The estimated ejection fraction was in the range of 65% to 70%. Wall motion was normal; there were no regional wall motion abnormalities. - Aortic valve: Valve area (VTI): 0.98 cm^2. Valve area (Vmax): 1.07 cm^2. Valve area (Vmean): 1.08 cm^2. - Mitral valve: There was moderate regurgitation. - Right ventricle: The cavity size was mildly dilated. - Pulmonary arteries: Systolic pressure was moderately to severely increased. PA peak pressure: 66 mm Hg (S).  Current Medications:  . antiseptic  oral rinse  7 mL Mouth Rinse BID  . aspirin EC  81 mg Oral Daily  . furosemide  20 mg Intravenous Q12H  . levothyroxine  100 mcg Oral QAC breakfast  . nebivolol  2.5 mg Oral Daily  . pantoprazole  40 mg Oral Daily  . rosuvastatin  2.5 mg Oral Daily  . sodium chloride  3 mL Intravenous Q12H  . sodium chloride  3 mL Intravenous Q12H      ASSESSMENT AND PLAN: 1. Acute Diastolic CHF - CXR was obtained showing diffuse interstitial opacity with [name] lines, symmetric basilar airspace disease, and stable borderline cardiomegaly and aortic tortuosity. CT imaging showed congestive heart failure superimposed on emphysematous change with reflux of contrast into the inferior vena cava and hepatic veins was noted and is indicative of increased right heart pressure.  - BNP elevated to 460. Patient's baseline weight is reported as 114lbs on her home scales. Is at 112lbs currently, down 3lbs since admission.  - Echo showed preserved EF of 65-70% with mild LVH, moderate MR, and PA peak pressure of 66. - Has been receiving Lasix 20mg  IV BID. Net output of 861mL. Has symptomatically improved but has continuing elevated JVD and minimal rales. Can likely switch to PO Lasix in the next 24 hours.  2. Severe MR - in reviewing the echocardiogram, Dr. Stanford Breed makes note the patient has severe MR with prolapse of the posterior mitral valve leaflet. - scheduled for TEE on 10/03/2015 at 1300 with Dr. Aundra Dubin. Consent has been fully documented in previous note. Planning to undergo RHC and LHC on 10/04/2015 which is tentatively scheduled with Dr. Tamala Julian at 28. - Dr. Roxan Hockey with [facility] has examined her case and suspects she will need mitral repair with possible CABG pending results of cardiac cath.  3. Acute Respiratory Failure - was requiring BiPAP at admission. Now currently on Room Air. - was initially started on Vancomycin and Zosyn due to acute respiratory failure and elevated WBC. These have now been  discontinued.  4. HLD - continue statin therapy  Signed, Erma Heritage , PA-C 9:05 AM 10/03/2015 Pager: [phone] As above, patient seen and examined. Patient noted dyspnea with bathing this morning. No chest pain. Plan as outlined previously. Her transthoracic echocardiogram shows prolapse of the posterior mitral valve leaflet with severe eccentric mitral regurgitation. Plan for transesophageal echocardiogram today to better assess mitral valve morphology and severity of mitral regurgitation. Plan right and left cardiac catheterization tomorrow. The risks and benefits were discussed and she agrees to proceed. She will ultimately need mitral valve repair +- coronary artery bypass and graft. Continue present dose of Lasix. Hold tomorrow morning prior to catheterization. Kirk Ruths

## 2015-10-03 NOTE — Progress Notes (Signed)
      [address]       [place] [address]             [phone]      I was asked by Dr. Stanford Breed and Amy Fischer and her family for input on her case.  She is an 81 yo woman in generally good health. She presented with acute diastolic CHF.  Her echo was read as showing moderate MR with preserved LV systolic function.  I reviewed the echo with Dr. Stanford Breed and agree with him that in a couple of the views the MR appears severe.  Agree with his recommendation for TEE and cath to further evaluate.  I suspect she will need a mitral repair. Unknown whether she will CABG at same time  Will follow  Remo Lipps C. Roxan Hockey, MD [facility] [phone]

## 2015-10-03 NOTE — Progress Notes (Signed)
  Echocardiogram Echocardiogram Transesophageal has been performed.  Amy Fischer 10/03/2015, 2:42 PM

## 2015-10-04 ENCOUNTER — Ambulatory Visit (HOSPITAL_COMMUNITY): Payer: Medicare Other

## 2015-10-04 ENCOUNTER — Encounter (HOSPITAL_COMMUNITY): Payer: Self-pay | Admitting: Cardiology

## 2015-10-04 ENCOUNTER — Encounter (HOSPITAL_COMMUNITY)
Admission: EM | Disposition: A | Discharge status: Home Health | Payer: Self-pay | Source: Home / Self Care | Attending: Thoracic Surgery (Cardiothoracic Vascular Surgery)

## 2015-10-04 DIAGNOSIS — I2510 Atherosclerotic heart disease of native coronary artery without angina pectoris: Secondary | ICD-10-CM

## 2015-10-04 DIAGNOSIS — M25552 Pain in left hip: Secondary | ICD-10-CM

## 2015-10-04 HISTORY — PX: CARDIAC CATHETERIZATION: SHX172

## 2015-10-04 LAB — PROTIME-INR
INR: 1.02 (ref 0.00–1.49)
PROTHROMBIN TIME: 13.6 s (ref 11.6–15.2)

## 2015-10-04 LAB — BASIC METABOLIC PANEL
ANION GAP: 10 (ref 5–15)
BUN: 25 mg/dL — ABNORMAL HIGH (ref 6–20)
CALCIUM: 9.3 mg/dL (ref 8.9–10.3)
CO2: 26 mmol/L (ref 22–32)
Chloride: 102 mmol/L (ref 101–111)
Creatinine, Ser: 0.76 mg/dL (ref 0.44–1.00)
Glucose, Bld: 103 mg/dL — ABNORMAL HIGH (ref 65–99)
Potassium: 3.7 mmol/L (ref 3.5–5.1)
SODIUM: 138 mmol/L (ref 135–145)

## 2015-10-04 LAB — CBC
HEMATOCRIT: 38.2 % (ref 36.0–46.0)
Hemoglobin: 12.8 g/dL (ref 12.0–15.0)
MCH: 31.8 pg (ref 26.0–34.0)
MCHC: 33.5 g/dL (ref 30.0–36.0)
MCV: 94.8 fL (ref 78.0–100.0)
Platelets: 232 10*3/uL (ref 150–400)
RBC: 4.03 MIL/uL (ref 3.87–5.11)
RDW: 13.6 % (ref 11.5–15.5)
WBC: 9.1 10*3/uL (ref 4.0–10.5)

## 2015-10-04 SURGERY — RIGHT/LEFT HEART CATH AND CORONARY ANGIOGRAPHY
Anesthesia: LOCAL

## 2015-10-04 MED ORDER — MIDAZOLAM HCL 2 MG/2ML IJ SOLN
INTRAMUSCULAR | Status: DC | PRN
  Administered 2015-10-04: 1 mg via INTRAVENOUS

## 2015-10-04 MED ORDER — ACETAMINOPHEN 325 MG PO TABS: 650.0000 mg | ORAL_TABLET | ORAL | Status: DC | PRN

## 2015-10-04 MED ORDER — SALINE SPRAY 0.65 % NA SOLN
1.0000 | Freq: Every day | NASAL | Status: DC | PRN
  Filled 2015-10-04: qty 44

## 2015-10-04 MED ORDER — AZELASTINE HCL 0.1 % NA SOLN: 2.0000 | Freq: Two times a day (BID) | NASAL | Status: DC

## 2015-10-04 MED ORDER — EZETIMIBE 10 MG PO TABS
10.0000 mg | ORAL_TABLET | Freq: Every day | ORAL | Status: DC
  Administered 2015-10-04 – 2015-10-22 (×16): 10 mg via ORAL
  Filled 2015-10-04 (×19): qty 1

## 2015-10-04 MED ORDER — DOXYCYCLINE HYCLATE 100 MG PO TABS: 100.0000 mg | ORAL_TABLET | Freq: Two times a day (BID) | ORAL | Status: DC

## 2015-10-04 MED ORDER — POLYVINYL ALCOHOL 1.4 % OP SOLN
Freq: Three times a day (TID) | OPHTHALMIC | Status: DC | PRN
  Filled 2015-10-04: qty 15

## 2015-10-04 MED ORDER — SODIUM CHLORIDE 0.9 % IV SOLN: 250.0000 mL | INTRAVENOUS | Status: DC | PRN

## 2015-10-04 MED ORDER — SODIUM CHLORIDE 0.9 % WEIGHT BASED INFUSION: 1.0000 mL/kg/h | INTRAVENOUS | Status: AC

## 2015-10-04 MED ORDER — SODIUM CHLORIDE 0.9 % IJ SOLN
3.0000 mL | Freq: Two times a day (BID) | INTRAMUSCULAR | Status: DC
Start: 2015-10-04 — End: 2015-10-10
  Administered 2015-10-04 – 2015-10-09 (×11): 3 mL via INTRAVENOUS

## 2015-10-04 MED ORDER — SODIUM CHLORIDE 0.9 % IJ SOLN: 3.0000 mL | INTRAMUSCULAR | Status: DC | PRN

## 2015-10-04 MED ORDER — ASPIRIN 325 MG PO TABS: 325.0000 mg | ORAL_TABLET | Freq: Every day | ORAL | Status: DC

## 2015-10-04 MED ORDER — MIDAZOLAM HCL 2 MG/2ML IJ SOLN
INTRAMUSCULAR | Status: AC
  Filled 2015-10-04: qty 4

## 2015-10-04 MED ORDER — LIDOCAINE HCL (PF) 1 % IJ SOLN
INTRAMUSCULAR | Status: DC | PRN
  Administered 2015-10-04: 14:00:00

## 2015-10-04 MED ORDER — ADULT MULTIVITAMIN W/MINERALS CH
1.0000 | ORAL_TABLET | Freq: Every day | ORAL | Status: DC
  Administered 2015-10-05 – 2015-10-22 (×13): 1 via ORAL
  Filled 2015-10-04 (×22): qty 1

## 2015-10-04 MED ORDER — GLUCOSAMINE-CHONDROITIN 500-400 MG PO TABS: 1.0000 | ORAL_TABLET | Freq: Three times a day (TID) | ORAL | Status: DC

## 2015-10-04 MED ORDER — NITROGLYCERIN 1 MG/10 ML FOR IR/CATH LAB
INTRA_ARTERIAL | Status: AC
  Filled 2015-10-04: qty 10

## 2015-10-04 MED ORDER — FENTANYL CITRATE (PF) 100 MCG/2ML IJ SOLN
INTRAMUSCULAR | Status: DC | PRN
  Administered 2015-10-04: 50 ug via INTRAVENOUS

## 2015-10-04 MED ORDER — MELATONIN 5 MG PO CAPS: 2.5000 mg | ORAL_CAPSULE | Freq: Every evening | ORAL | Status: DC | PRN

## 2015-10-04 MED ORDER — OMEGA-3-ACID ETHYL ESTERS 1 G PO CAPS
1.0000 g | ORAL_CAPSULE | Freq: Every day | ORAL | Status: DC
  Administered 2015-10-05 – 2015-10-22 (×13): 1 g via ORAL
  Filled 2015-10-04 (×25): qty 1

## 2015-10-04 MED ORDER — VITAMIN E 180 MG (400 UNIT) PO CAPS
400.0000 [IU] | ORAL_CAPSULE | Freq: Every day | ORAL | Status: DC
  Administered 2015-10-04 – 2015-10-22 (×15): 400 [IU] via ORAL
  Filled 2015-10-04 (×19): qty 1

## 2015-10-04 MED ORDER — HEPARIN (PORCINE) IN NACL 2-0.9 UNIT/ML-% IJ SOLN
INTRAMUSCULAR | Status: AC
  Filled 2015-10-04: qty 1000

## 2015-10-04 MED ORDER — FENTANYL CITRATE (PF) 100 MCG/2ML IJ SOLN
INTRAMUSCULAR | Status: AC
  Filled 2015-10-04: qty 4

## 2015-10-04 MED ORDER — IOHEXOL 350 MG/ML SOLN
INTRAVENOUS | Status: DC | PRN
  Administered 2015-10-04: 85 mL via INTRA_ARTERIAL

## 2015-10-04 MED ORDER — LOPERAMIDE HCL 2 MG PO CAPS
2.0000 mg | ORAL_CAPSULE | Freq: Once | ORAL | Status: AC
  Administered 2015-10-04: 2 mg via ORAL
  Filled 2015-10-04: qty 1

## 2015-10-04 MED ORDER — LIDOCAINE HCL (PF) 1 % IJ SOLN
INTRAMUSCULAR | Status: AC
  Filled 2015-10-04: qty 30

## 2015-10-04 MED ORDER — ONDANSETRON HCL 4 MG/2ML IJ SOLN: 4.0000 mg | Freq: Four times a day (QID) | INTRAMUSCULAR | Status: DC | PRN

## 2015-10-04 SURGICAL SUPPLY — 11 items
CATH INFINITI 5FR ANG PIGTAIL (CATHETERS) ×2 IMPLANT
CATH SITESEER 5F MULTI A 2 (CATHETERS) ×2 IMPLANT
CATH SWAN GANZ 7F STRAIGHT (CATHETERS) ×2 IMPLANT
KIT HEART LEFT (KITS) ×2 IMPLANT
KIT HEART RIGHT NAMIC (KITS) ×2 IMPLANT
PACK CARDIAC CATHETERIZATION (CUSTOM PROCEDURE TRAY) ×2 IMPLANT
SHEATH PINNACLE 5F 10CM (SHEATH) ×2 IMPLANT
SHEATH PINNACLE 7F 10CM (SHEATH) ×2 IMPLANT
TRANSDUCER W/STOPCOCK (MISCELLANEOUS) ×4 IMPLANT
TUBING CIL FLEX 10 FLL-RA (TUBING) ×2 IMPLANT
WIRE EMERALD 3MM-J .035X150CM (WIRE) ×2 IMPLANT

## 2015-10-04 NOTE — Research (Signed)
STATUS FIRST Informed Consent   Subject Name: Amy Fischer  Subject met inclusion and exclusion criteria.  The informed consent form, study requirements and expectations were reviewed with the subject and questions and concerns were addressed prior to the signing of the consent form.  The subject verbalized understanding of the trail requirements.  The subject agreed to participate in the STATUS FIRST trial and signed the informed consent.  The informed consent was obtained prior to performance of any protocol-specific procedures for the subject.  A copy of the signed informed consent was given to the subject and a copy was placed in the subject's medical record.  [name] 10/04/2015, 10:35 AM

## 2015-10-04 NOTE — Progress Notes (Signed)
Patient Name: Amy Fischer Date of Encounter: 10/04/2015   Primary Cardiologist: New - Dr. Stanford Breed Patient Profile: 81 y.o. female with PMH of HLD, Hypothyroidism, [name] who presented to [facility] ED on 09/30/2015 for shortness of breath. Found to have acute diastolic CHF likely secondary to severe MR.   SUBJECTIVE: No chest pain; c/o DOE  OBJECTIVE Filed Vitals:   10/03/15 1646 10/03/15 1722 10/03/15 2129 10/04/15 0457  BP: 131/45  119/60 119/49  Pulse: 96  79 76  Temp:  97.7 F (36.5 C) 98.4 F (36.9 C) 97.7 F (36.5 C)  TempSrc:   Oral Oral  Resp: 18  18 18   Height:      Weight:    51.529 kg (113 lb 9.6 oz)  SpO2: 98%  98% 93%    Intake/Output Summary (Last 24 hours) at 10/04/15 0900 Last data filed at 10/04/15 0502  Gross per 24 hour  Intake    483 ml  Output    150 ml  Net    333 ml   Filed Weights   10/02/15 0435 10/03/15 0443 10/04/15 0457  Weight: 51.755 kg (114 lb 1.6 oz) 51.2 kg (112 lb 14 oz) 51.529 kg (113 lb 9.6 oz)    PHYSICAL EXAM General: Well developed, well nourished, female in no acute distress. Head: Normocephalic, atraumatic.  Neck: Supple Lungs:  CTA Heart: RRR, 3/6 SEM Abdomen: Soft, non-tender, non-distended, no masses.  Extremities: No edema.  Neuro: Grossly intact Psych: Normal affect.   LABS: CBC:  Recent Labs  10/02/15 0522 10/04/15 0528  WBC 9.1 9.1  HGB 12.9 12.8  HCT 38.8 38.2  MCV 95.3 94.8  PLT 186 001   Basic Metabolic Panel:  Recent Labs  10/02/15 0522 10/04/15 0528  NA 137 138  K 3.8 3.7  CL 100* 102  CO2 29 26  GLUCOSE 107* 103*  BUN 17 25*  CREATININE 0.93 0.76  CALCIUM 8.9 9.3    B NATRIURETIC PEPTIDE  Date/Time Value Ref Range Status  09/30/2015 03:35 AM 460.6* 0.0 - 100.0 pg/mL Final    TELE:  NSR    ECHO: 10/01/2015 Study Conclusions - Left ventricle: The cavity size was normal. Wall thickness was increased in a pattern of mild LVH. Systolic function  was vigorous. The estimated ejection fraction was in the range of 65% to 70%. Wall motion was normal; there were no regional wall motion abnormalities. - Aortic valve: Valve area (VTI): 0.98 cm^2. Valve area (Vmax): 1.07 cm^2. Valve area (Vmean): 1.08 cm^2. - Mitral valve: There was moderate regurgitation. - Right ventricle: The cavity size was mildly dilated. - Pulmonary arteries: Systolic pressure was moderately to severely increased. PA peak pressure: 66 mm Hg (S).  Current Medications:  . antiseptic oral rinse  7 mL Mouth Rinse BID  . aspirin EC  81 mg Oral Daily  . furosemide  20 mg Intravenous Q12H  . heparin subcutaneous  5,000 Units Subcutaneous 3 times per day  . levothyroxine  100 mcg Oral QAC breakfast  . loperamide  2 mg Oral Once  . nebivolol  2.5 mg Oral Daily  . pantoprazole  40 mg Oral Daily  . rosuvastatin  2.5 mg Oral Daily   . sodium chloride    . sodium chloride 10 mL/hr at 10/04/15 0511    ASSESSMENT AND PLAN: 1. Acute Diastolic CHF - CHF related to MR; TEE reveals normal LV function; flail P2 with ruptured chord and severe MR. For R  and L cath today. Will then need MV repair +- CABG. Resume lasix 40 mg daily following cath. Dr Roxan Hockey aware and following.  2. Severe MR - Plan as outlined above  3. HLD - continue statin therapy  Signed, Ellis Parents

## 2015-10-04 NOTE — H&P (View-Only) (Signed)
Patient Name: Amy Fischer Date of Encounter: 10/04/2015   Primary Cardiologist: New - Dr. Stanford Breed Patient Profile: 81 y.o. female with PMH of HLD, Hypothyroidism, [name] who presented to [facility] ED on 09/30/2015 for shortness of breath. Found to have acute diastolic CHF likely secondary to severe MR.   SUBJECTIVE: No chest pain; c/o DOE  OBJECTIVE Filed Vitals:   10/03/15 1646 10/03/15 1722 10/03/15 2129 10/04/15 0457  BP: 131/45  119/60 119/49  Pulse: 96  79 76  Temp:  97.7 F (36.5 C) 98.4 F (36.9 C) 97.7 F (36.5 C)  TempSrc:   Oral Oral  Resp: 18  18 18   Height:      Weight:    51.529 kg (113 lb 9.6 oz)  SpO2: 98%  98% 93%    Intake/Output Summary (Last 24 hours) at 10/04/15 0900 Last data filed at 10/04/15 0502  Gross per 24 hour  Intake    483 ml  Output    150 ml  Net    333 ml   Filed Weights   10/02/15 0435 10/03/15 0443 10/04/15 0457  Weight: 51.755 kg (114 lb 1.6 oz) 51.2 kg (112 lb 14 oz) 51.529 kg (113 lb 9.6 oz)    PHYSICAL EXAM General: Well developed, well nourished, female in no acute distress. Head: Normocephalic, atraumatic.  Neck: Supple Lungs:  CTA Heart: RRR, 3/6 SEM Abdomen: Soft, non-tender, non-distended, no masses.  Extremities: No edema.  Neuro: Grossly intact Psych: Normal affect.   LABS: CBC:  Recent Labs  10/02/15 0522 10/04/15 0528  WBC 9.1 9.1  HGB 12.9 12.8  HCT 38.8 38.2  MCV 95.3 94.8  PLT 186 794   Basic Metabolic Panel:  Recent Labs  10/02/15 0522 10/04/15 0528  NA 137 138  K 3.8 3.7  CL 100* 102  CO2 29 26  GLUCOSE 107* 103*  BUN 17 25*  CREATININE 0.93 0.76  CALCIUM 8.9 9.3    B NATRIURETIC PEPTIDE  Date/Time Value Ref Range Status  09/30/2015 03:35 AM 460.6* 0.0 - 100.0 pg/mL Final    TELE:  NSR    ECHO: 10/01/2015 Study Conclusions - Left ventricle: The cavity size was normal. Wall thickness was increased in a pattern of mild LVH. Systolic function  was vigorous. The estimated ejection fraction was in the range of 65% to 70%. Wall motion was normal; there were no regional wall motion abnormalities. - Aortic valve: Valve area (VTI): 0.98 cm^2. Valve area (Vmax): 1.07 cm^2. Valve area (Vmean): 1.08 cm^2. - Mitral valve: There was moderate regurgitation. - Right ventricle: The cavity size was mildly dilated. - Pulmonary arteries: Systolic pressure was moderately to severely increased. PA peak pressure: 66 mm Hg (S).  Current Medications:  . antiseptic oral rinse  7 mL Mouth Rinse BID  . aspirin EC  81 mg Oral Daily  . furosemide  20 mg Intravenous Q12H  . heparin subcutaneous  5,000 Units Subcutaneous 3 times per day  . levothyroxine  100 mcg Oral QAC breakfast  . loperamide  2 mg Oral Once  . nebivolol  2.5 mg Oral Daily  . pantoprazole  40 mg Oral Daily  . rosuvastatin  2.5 mg Oral Daily   . sodium chloride    . sodium chloride 10 mL/hr at 10/04/15 0511    ASSESSMENT AND PLAN: 1. Acute Diastolic CHF - CHF related to MR; TEE reveals normal LV function; flail P2 with ruptured chord and severe MR. For R  and L cath today. Will then need MV repair +- CABG. Resume lasix 40 mg daily following cath. Dr Roxan Hockey aware and following.  2. Severe MR - Plan as outlined above  3. HLD - continue statin therapy  Signed, Ellis Parents

## 2015-10-04 NOTE — Progress Notes (Signed)
Site area: rt groin fa and fv sheaths removed and pressure held by Guerry Minors Site Prior to Removal:  Level  0 Pressure Applied For:  20 minutes Manual:   yes Patient Status During Pull:  stable Post Pull Site:  Level  0 Post Pull Instructions Given:  yes Post Pull Pulses Present: yes Dressing Applied:  tegaderm Bedrest begins @  1500 Comments:  0

## 2015-10-04 NOTE — Progress Notes (Signed)
Pt bedrest over. Site remains level 0. R pulses remain 2+, extremity is warm to touch, normal color for ethnicity. Vital signs stable. Pt instructed to call if site begins to bleed. Pt verbalized understanding. Will continue to monitor pt.  Maurene Capes RN

## 2015-10-04 NOTE — Care Management Important Message (Signed)
Important Message  Patient Details  Name: Amy Fischer MRN: [id number] Date of Birth: 20-Mar-1929   Medicare Important Message Given:  Yes-second notification given    [name] 10/04/2015, 1:02 PM

## 2015-10-04 NOTE — Progress Notes (Signed)
      [address]       [place] [address]             [phone]      No complaints  BP 123/48 mmHg  Pulse 78  Temp(Src) 97.8 F (36.6 C) (Oral)  Resp 18  Ht 5\' 2"  (1.575 m)  Wt 113 lb 9.6 oz (51.529 kg)  BMI 20.77 kg/m2  SpO2 100%   Intake/Output Summary (Last 24 hours) at 10/04/15 1751 Last data filed at 10/04/15 1533  Gross per 24 hour  Intake    580 ml  Output    150 ml  Net    430 ml    Exam unchanged  Cath today showed normal coronaries  She is interested in a right chest approach to mitral repair. Dr Roxy Manns is out today and tomorrow  I will check with office in AM to see when he might have availability  [place]. Roxan Hockey, MD [facility] [phone]

## 2015-10-04 NOTE — Progress Notes (Addendum)
PT Cancellation Note  Patient Details Name: Amy Fischer MRN: [id number] DOB: 24-May-1929   Cancelled Treatment:    Reason Eval/Treat Not Completed: Other (comment) (pt remains on strict bedrest and await increased activity 0901) Pt now off floor for cardiac cath and still on bedrest. Will attempt next date   Melford Aase 10/04/2015, 9:01 AM Elwyn Reach, [place]

## 2015-10-04 NOTE — Progress Notes (Addendum)
[facility] PROGRESS NOTE  Amy Fischer OEU:[id number] DOB: 08-Apr-1929 DOA: 09/30/2015  PCP: [name], MD  Brief HPI: 81 year old female with a history of hypertension, carotid stenosis workup around 3 AM on 09/30/2015 with shortness of breath. The patient was found to be in respiratory distress unable to speak full sentences. EMS was activated and the patient was found to have oxygen saturation of 80%. Initial telemetry revealed atrial fibrillation with RVR. The patient was placed on BiPAP and started on intravenous furosemide with good clinical effect. Chest x-ray revealed pulmonary edema. CT angiogram of the chest was negative for pulmonary embolus. The patient has been complaining of decreased exercise endurance with her activities of daily living for the past week to 2 weeks, but she has denied any chest discomfort.  Past medical history:  Past Medical History  Diagnosis Date  . GERD (gastroesophageal reflux disease)   . Hyperlipidemia   . Hypothyroidism   . Osteoarthritis   . Osteoporosis   . Episodic recurrent vertigo     MRI Head 2003  . Allergy   . Bladder polyps   . Subclavian steal syndrome     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; CTA 2013 revealed open graft  . Hyperplastic colon polyp   . Esophageal spasm   . History of hepatitis     unknown type  . Asymptomatic carotid artery stenosis     R ICA 40% stenosis on CTA 12/2011.  [name] Anemia   . Cataract     BILATERAL-REMOVED  . Elevated blood pressure 03/25/2011    Bp readings borderline today has hx of elvation  In office and ok at home   Not checke recently   She gfeels was elevated from anxiety    . Diverticulosis   . Intestinal metaplasia of gastric mucosa   . Hepatic hemangioma     Consultants: Cardiology  Procedures:  Echocardiogram Study Conclusions - Left ventricle: The cavity size was normal. Wall thickness wasincreased in a pattern of mild LVH. Systolic function wasvigorous.  The estimated ejection fraction was in the range of 65%to 70%. Wall motion was normal; there were no regional wallmotion abnormalities. - Aortic valve: Valve area (VTI): 0.98 cm^2. Valve area (Vmax):1.07 cm^2. Valve area (Vmean): 1.08 cm^2. - Mitral valve: There was moderate regurgitation. - Right ventricle: The cavity size was mildly dilated. - Pulmonary arteries: Systolic pressure was moderately to severelyincreased. PA peak pressure: 66 mm Hg (S).  TEE 10/5 Study Conclusions - Left ventricle: The cavity size was normal. Wall thickness wasincreased in a pattern of mild LVH. Systolic function was normal.The estimated ejection fraction was in the range of 60% to 65%.Wall motion was normal; there were no regional wall motionabnormalities. - Aortic valve: There was no stenosis. - Aorta: The aorta was normal in caliber with grade III plaque inthe descending thoracic aorta. - Mitral valve: The mitral valve was thickened/myxomatous. Therewas a partial flail P2 segment with possible ruptured chordnoted. There was severe, anteriorly-directed mitral regurgitation(unable to do PISA due to eccentricity). There was pulmonary veinsystolic flow reversal on PW doppler imaging. - Left atrium: No evidence of thrombus in the atrial cavity orappendage. - Right ventricle: The cavity size was normal. Systolic functionwas normal. - Right atrium: No evidence of thrombus in the atrial cavity orappendage. - Tricuspid valve: There was mild-moderate regurgitation. PeakRV-RA gradient 61 mmHg.  Right and left heart catheterization scheduled for 10/6  Antibiotics: none  Subjective: Patient gets short of breath with minimal exertion. Breathing normally  at rest. Denies any chest pain. Denies any chest nausea, vomiting.  Objective: Vital Signs  Filed Vitals:   10/03/15 1646 10/03/15 1722 10/03/15 2129 10/04/15 0457  BP: 131/45  119/60 119/49  Pulse: 96  79 76  Temp:  97.7 F (36.5 C) 98.4 F (36.9  C) 97.7 F (36.5 C)  TempSrc:   Oral Oral  Resp: 18  18 18   Height:      Weight:    51.529 kg (113 lb 9.6 oz)  SpO2: 98%  98% 93%    Intake/Output Summary (Last 24 hours) at 10/04/15 0842 Last data filed at 10/04/15 0502  Gross per 24 hour  Intake    483 ml  Output    150 ml  Net    333 ml   Filed Weights   10/02/15 0435 10/03/15 0443 10/04/15 0457  Weight: 51.755 kg (114 lb 1.6 oz) 51.2 kg (112 lb 14 oz) 51.529 kg (113 lb 9.6 oz)    General appearance: alert, cooperative, appears stated age and no distress Resp: few crackles at the bases. Mostly clear to auscultation. Cardio: regular rate and rhythm, S1, S2 normal, loud systolic murmur at apex, click, rub or gallop GI: soft, non-tender; bowel sounds normal; no masses,  no organomegaly Neurologic: alert and oriented 3. No focal neurological deficits are noted.  Lab Results:  Basic Metabolic Panel:  Recent Labs Lab 09/30/15 0335 09/30/15 1010 10/01/15 0235 10/02/15 0522 10/04/15 0528  NA 137  --  138 137 138  K 3.9  --  4.1 3.8 3.7  CL 106  --  108 100* 102  CO2 16*  --  23 29 26   GLUCOSE 328*  --  116* 107* 103*  BUN 15  --  21* 17 25*  CREATININE 1.08* 0.82  0.79 0.89 0.93 0.76  CALCIUM 9.0  --  8.6* 8.9 9.3  MG  --  2.1  --   --   --    Liver Function Tests:  Recent Labs Lab 09/30/15 1010 10/01/15 0235  AST 30 21  ALT 20 13*  ALKPHOS 57 50  BILITOT 0.5 0.9  PROT 5.8* 5.8*  ALBUMIN 3.2* 3.0*   CBC:  Recent Labs Lab 09/30/15 0335 09/30/15 1010 10/01/15 0235 10/02/15 0522 10/04/15 0528  WBC 14.2* 10.1  10.3 11.1* 9.1 9.1  NEUTROABS 5.0  --   --   --   --   HGB 14.2 13.5  13.5 12.3 12.9 12.8  HCT 44.6 41.2  40.9 38.1 38.8 38.2  MCV 97.8 94.7  94.5 96.5 95.3 94.8  PLT 246 208  203 191 186 232   Cardiac Enzymes:  Recent Labs Lab 09/30/15 0335 09/30/15 1010 09/30/15 1453 09/30/15 2030  TROPONINI <0.03 0.08* 0.07* 0.04*   BNP (last 3 results)  Recent Labs  09/30/15 0335    BNP 460.6*    Recent Results (from the past 240 hour(s))  Blood culture (routine x 2)     Status: None (Preliminary result)   Collection Time: 09/30/15  6:53 AM  Result Value Ref Range Status   Specimen Description BLOOD RIGHT ANTECUBITAL  Final   Special Requests BOTTLES DRAWN AEROBIC AND ANAEROBIC 5CC  Final   Culture NO GROWTH 3 DAYS  Final   Report Status PENDING  Incomplete  Blood culture (routine x 2)     Status: None (Preliminary result)   Collection Time: 09/30/15  6:55 AM  Result Value Ref Range Status   Specimen Description BLOOD RIGHT HAND  Final   Special Requests BOTTLES DRAWN AEROBIC ONLY 3CC  Final   Culture NO GROWTH 3 DAYS  Final   Report Status PENDING  Incomplete  MRSA PCR Screening     Status: None   Collection Time: 09/30/15 10:57 AM  Result Value Ref Range Status   MRSA by PCR NEGATIVE NEGATIVE Final    Comment:        The GeneXpert MRSA Assay (FDA approved for NASAL specimens only), is one component of a comprehensive MRSA colonization surveillance program. It is not intended to diagnose MRSA infection nor to guide or monitor treatment for MRSA infections.   C difficile quick scan w PCR reflex     Status: None   Collection Time: 10/03/15  5:53 PM  Result Value Ref Range Status   C Diff antigen NEGATIVE NEGATIVE Final   C Diff toxin NEGATIVE NEGATIVE Final   C Diff interpretation Negative for toxigenic C. difficile  Final      Studies/Results: Dg Hip Unilat With Pelvis 2-3 Views Left  10/02/2015   CLINICAL DATA:  Acute left hip pain without known injury.  EXAM: DG HIP (WITH OR WITHOUT PELVIS) 2-3V LEFT  COMPARISON:  December 07, 2014.  FINDINGS: There is no evidence of hip fracture or dislocation. No significant degenerative changes seen involving either hip joint. There does appear to be old healed fracture involving the left inferior pubic ramus.  IMPRESSION: Old healed fracture involving the left inferior pubic ramus. Hip joints appear normal.    Electronically Signed   By: Marijo Conception, M.D.   On: 10/02/2015 18:28    Medications:  Scheduled: . antiseptic oral rinse  7 mL Mouth Rinse BID  . aspirin EC  81 mg Oral Daily  . furosemide  20 mg Intravenous Q12H  . heparin subcutaneous  5,000 Units Subcutaneous 3 times per day  . levothyroxine  100 mcg Oral QAC breakfast  . nebivolol  2.5 mg Oral Daily  . pantoprazole  40 mg Oral Daily  . rosuvastatin  2.5 mg Oral Daily   Continuous: . sodium chloride    . sodium chloride 10 mL/hr at 10/04/15 1884   ZYS:[id number] **OR** acetaminophen, ondansetron **OR** ondansetron (ZOFRAN) IV  Assessment/Plan:  Principal Problem:   Respiratory distress Active Problems:   Hypothyroidism   GERD   Hypertension, isolated systolic   Acute on chronic diastolic congestive heart failure, NYHA class 3 (HCC)   Paroxysmal atrial fibrillation (HCC)   Acute respiratory failure with hypoxia and hypercapnia (HCC)   Mitral regurgitation    Acute Respiratory Failure with hypoxia and hypercapnea -BiPAP-->weaned to RA over 48 hours -this is thought to be secondary to CHF with underlying COPD -CT angiogram chest--negative for pulmonary embolus but revealed small bilateral pleural effusions with emphysema; interstitial and alveolar edema present  Acute Diastolic CHF secondary to Severe mitral regurgitation/Pulmonary HTN -patient has improved. Though she still gets quite dyspneic with minimal exertion. Cardiology is following and managing. -10/01/2015 echo--EF 65-70 percent, moderate MR, PAP 66 -cardiology was consulted. They suspect that her degree of mitral regurgitation is worse than what appears on the echocardiogram. TEE was subsequently performed. Severe mitral regurgitation was noted. Plan is for left and right heart catheterization today. She has been seen by cardiothoracic surgery. Plan is for mitral valve repair during this hospitalization.  -she continues to be in negative fluid balance.  Continues to be losing weight due to diuresis. -continue Bystolic - initially started on antibiotics, which have been discontinued.  Questionable  arrhythmia -Initially thought to be in atrial fibrillation, however, cardiology does not think that this was the case. Anticoagulation has been discontinued for now. Will defer to cardiology regarding this matter. TSH--0.586  Elevated troponin -Secondary to demand ischemia in the setting of diastolic CHF -No symptoms of angina  Hyperlipidemia -Continue Crestor  Hypothyroidism -Continue Synthroid -TSH 0.586  GERD -continue PPI  L-hip pain -xray left hip is unremarkable. Continue analgesic agents.  DVT Prophylaxis: subcutaneous heparin    Code Status: full code  Family Communication: discussed with patient  Disposition Plan: await further cardiac testing.    LOS: 4 days   [facility] Pager [phone] 10/04/2015, 8:42 AM  If 7PM-7AM, please contact night-coverage at [email or web address], password [facility]

## 2015-10-04 NOTE — Interval H&P Note (Signed)
Cath Lab Visit (complete for each Cath Lab visit)  Clinical Evaluation Leading to the Procedure:   ACS: No.  Non-ACS:    Anginal Classification: CCS III  Anti-ischemic medical therapy: Minimal Therapy (1 class of medications)  Non-Invasive Test Results: No non-invasive testing performed  Prior CABG: No previous CABG      History and Physical Interval Note:  10/04/2015 1:33 PM  Amy Fischer  has presented today for surgery, with the diagnosis of severe mitrial regurgitation  The various methods of treatment have been discussed with the patient and family. After consideration of risks, benefits and other options for treatment, the patient has consented to  Procedure(s): Right/Left Heart Cath and Coronary Angiography (N/A) as a surgical intervention .  The patient's history has been reviewed, patient examined, no change in status, stable for surgery.  I have reviewed the patient's chart and labs.  Questions were answered to the patient's satisfaction.     Sinclair Grooms

## 2015-10-05 ENCOUNTER — Encounter (HOSPITAL_COMMUNITY): Payer: Self-pay | Admitting: Interventional Cardiology

## 2015-10-05 ENCOUNTER — Ambulatory Visit (HOSPITAL_COMMUNITY): Payer: Medicare Other

## 2015-10-05 DIAGNOSIS — I509 Heart failure, unspecified: Secondary | ICD-10-CM

## 2015-10-05 DIAGNOSIS — Z01810 Encounter for preprocedural cardiovascular examination: Secondary | ICD-10-CM

## 2015-10-05 LAB — POCT I-STAT 3, ART BLOOD GAS (G3+)
ACID-BASE DEFICIT: 4 mmol/L — AB (ref 0.0–2.0)
Bicarbonate: 22.3 mEq/L (ref 20.0–24.0)
O2 Saturation: 96 %
PCO2 ART: 45.4 mmHg — AB (ref 35.0–45.0)
PO2 ART: 94 mmHg (ref 80.0–100.0)
TCO2: 24 mmol/L (ref 0–100)
pH, Arterial: 7.299 — ABNORMAL LOW (ref 7.350–7.450)

## 2015-10-05 LAB — POCT I-STAT 3, VENOUS BLOOD GAS (G3P V)
Acid-Base Excess: 3 mmol/L — ABNORMAL HIGH (ref 0.0–2.0)
Bicarbonate: 28.8 mEq/L — ABNORMAL HIGH (ref 20.0–24.0)
O2 Saturation: 61 %
PH VEN: 7.394 — AB (ref 7.250–7.300)
TCO2: 30 mmol/L (ref 0–100)
pCO2, Ven: 47 mmHg (ref 45.0–50.0)
pO2, Ven: 32 mmHg (ref 30.0–45.0)

## 2015-10-05 LAB — CULTURE, BLOOD (ROUTINE X 2)
Culture: NO GROWTH
Culture: NO GROWTH

## 2015-10-05 LAB — CBC
HCT: 34.3 % — ABNORMAL LOW (ref 36.0–46.0)
Hemoglobin: 11.6 g/dL — ABNORMAL LOW (ref 12.0–15.0)
MCH: 32.2 pg (ref 26.0–34.0)
MCHC: 33.8 g/dL (ref 30.0–36.0)
MCV: 95.3 fL (ref 78.0–100.0)
PLATELETS: 209 10*3/uL (ref 150–400)
RBC: 3.6 MIL/uL — ABNORMAL LOW (ref 3.87–5.11)
RDW: 13.9 % (ref 11.5–15.5)
WBC: 7 10*3/uL (ref 4.0–10.5)

## 2015-10-05 LAB — BASIC METABOLIC PANEL
Anion gap: 11 (ref 5–15)
BUN: 21 mg/dL — ABNORMAL HIGH (ref 6–20)
CALCIUM: 9.1 mg/dL (ref 8.9–10.3)
CO2: 25 mmol/L (ref 22–32)
CREATININE: 0.74 mg/dL (ref 0.44–1.00)
Chloride: 101 mmol/L (ref 101–111)
Glucose, Bld: 82 mg/dL (ref 65–99)
Potassium: 3.3 mmol/L — ABNORMAL LOW (ref 3.5–5.1)
SODIUM: 137 mmol/L (ref 135–145)

## 2015-10-05 MED ORDER — LOPERAMIDE HCL 2 MG PO CAPS
2.0000 mg | ORAL_CAPSULE | Freq: Three times a day (TID) | ORAL | Status: DC | PRN
  Administered 2015-10-05 (×2): 2 mg via ORAL
  Filled 2015-10-05 (×3): qty 1

## 2015-10-05 MED ORDER — POTASSIUM CHLORIDE CRYS ER 20 MEQ PO TBCR
40.0000 meq | EXTENDED_RELEASE_TABLET | Freq: Once | ORAL | Status: AC
  Administered 2015-10-05: 40 meq via ORAL
  Filled 2015-10-05: qty 2

## 2015-10-05 MED ORDER — FUROSEMIDE 10 MG/ML IJ SOLN
40.0000 mg | Freq: Every day | INTRAMUSCULAR | Status: DC
  Administered 2015-10-05: 40 mg via INTRAVENOUS
  Filled 2015-10-05: qty 4

## 2015-10-05 NOTE — Evaluation (Signed)
Physical Therapy Evaluation Patient Details Name: Amy Fischer MRN: [id number] DOB: 12-08-1929 Today's Date: 10/05/2015   History of Present Illness  81 y.o. female with PMH of HLD, Hypothyroidism, [name] who presented to [facility] ED on 09/30/2015 for shortness of breath. Found to have acute diastolic CHF likely secondary to severe MR. Awaiting MVR  Clinical Impression  Pt very pleasant and able to mobilize within limited distance but fatigued end of session. Pt reports SOB with activity with sats 94-97% on RA, HR 77-94. Pt currently too fatigued to be able to manage house, cooking, cleaning and bathing without assist and unsure if she has available help from family. Pt will benefit from acute therapy to maximize strength, activity and function to decrease burden of care. Pt encouraged to continue ambulation within tolerance daily.     Follow Up Recommendations Supervision for mobility/OOB;SNF    Equipment Recommendations  None recommended by PT    Recommendations for Other Services OT consult     Precautions / Restrictions Precautions Precautions: Fall      Mobility  Bed Mobility               General bed mobility comments: EOB on arrival  Transfers Overall transfer level: Modified independent                  Ambulation/Gait Ambulation/Gait assistance: Supervision Ambulation Distance (Feet): 150 Feet Assistive device: None Gait Pattern/deviations: Step-through pattern;Decreased stride length   Gait velocity interpretation: Below normal speed for age/gender General Gait Details: cues for self-regulation with standing rest. Sats remained 95-97% on RA with gait  Stairs            Wheelchair Mobility    Modified Rankin (Stroke Patients Only)       Balance Overall balance assessment: Needs assistance   Sitting balance-Leahy Scale: Good       Standing balance-Leahy Scale: Fair                                Pertinent Vitals/Pain Pain Assessment: No/denies pain    Home Living Family/patient expects to be discharged to:: Private residence Living Arrangements: Alone Available Help at Discharge: Family;Available PRN/intermittently Type of Home: Apartment Home Access: Level entry     Home Layout: One level Home Equipment: Walker - 2 wheels;Cane - single point;Shower seat;Grab bars - tub/shower      Prior Function Level of Independence: Independent               Hand Dominance        Extremity/Trunk Assessment   Upper Extremity Assessment: Overall WFL for tasks assessed           Lower Extremity Assessment: Generalized weakness      Cervical / Trunk Assessment: Normal  Communication   Communication: No difficulties  Cognition Arousal/Alertness: Awake/alert Behavior During Therapy: WFL for tasks assessed/performed Overall Cognitive Status: Within Functional Limits for tasks assessed                      General Comments      Exercises        Assessment/Plan    PT Assessment Patient needs continued PT services  PT Diagnosis Difficulty walking   PT Problem List Decreased activity tolerance;Decreased balance;Decreased mobility  PT Treatment Interventions Gait training;Functional mobility training;Therapeutic activities;Balance training;Patient/family education;Therapeutic exercise   PT Goals (Current goals can be found in the  Care Plan section) Acute Rehab PT Goals Patient Stated Goal: return home and play bridge PT Goal Formulation: With patient Time For Goal Achievement: 10/19/15 Potential to Achieve Goals: Good    Frequency Min 3X/week   Barriers to discharge Decreased caregiver support      Co-evaluation               End of Session   Activity Tolerance: Patient tolerated treatment well Patient left: in chair;with call bell/phone within reach;with chair alarm set Nurse Communication: Mobility status         Time:  1200-1221 PT Time Calculation (min) (ACUTE ONLY): 21 min   Charges:   PT Evaluation $Initial PT Evaluation Tier I: 1 Procedure     PT G CodesMelford Aase 10/05/2015, 12:40 PM Elwyn Reach, [place]

## 2015-10-05 NOTE — Progress Notes (Signed)
CARDIAC REHAB PHASE I   PRE:  Rate/Rhythm: 77 SR    BP: sitting 116/53    SaO2: 98 2L  MODE:  Ambulation: 260 ft   POST:  Rate/Rhythm: 84 SR    BP: sitting 112/67     SaO2: 99 2L  Pt eager to walk and be stronger. Slow, controlled pace with RW. Used 2L O2 for support. Fairly steady. Able to increase distance from earlier today. Needed to rest at halfway due to [place]. Pt can walk with family or staff over weekend but reminders given to pace herself and rest when needed. Discussed mobility post op and d/c planning. She would be open to SNF until she is more independent after surgery. Gave IS and instructions, inspiring 800 mL right now. Gave OHS booklet, guideline and video to watch. Very receptive. [place], [place], ACSM 10/05/2015 2:23 PM

## 2015-10-05 NOTE — Progress Notes (Signed)
Patient Name: Amy Fischer Date of Encounter: 10/05/2015   Primary Cardiologist: New - Dr. Stanford Breed Patient Profile: 81 y.o. female with PMH of HLD, Hypothyroidism, [name] who presented to [facility] ED on 09/30/2015 for shortness of breath. Found to have acute diastolic CHF likely secondary to severe MR.   SUBJECTIVE: No chest pain; c/o DOE  OBJECTIVE Filed Vitals:   10/04/15 1800 10/04/15 2018 10/05/15 0046 10/05/15 0500  BP: 110/48 134/44 108/47 113/44  Pulse: 68 77 76 79  Temp:  97.8 F (36.6 C) 98.3 F (36.8 C) 98 F (36.7 C)  TempSrc:  Oral Oral Oral  Resp: 18 18 19 18   Height:      Weight:    51.755 kg (114 lb 1.6 oz)  SpO2: 100% 100% 99% 100%    Intake/Output Summary (Last 24 hours) at 10/05/15 1038 Last data filed at 10/05/15 0946  Gross per 24 hour  Intake 1331.67 ml  Output    200 ml  Net 1131.67 ml   Filed Weights   10/03/15 0443 10/04/15 0457 10/05/15 0500  Weight: 51.2 kg (112 lb 14 oz) 51.529 kg (113 lb 9.6 oz) 51.755 kg (114 lb 1.6 oz)    PHYSICAL EXAM General: Well developed, well nourished, female in no acute distress. Head: Normocephalic, atraumatic.  Neck: Supple Lungs:  CTA Heart: RRR, 3/6 SEM Abdomen: Soft, non-tender, non-distended, no masses.  Extremities: No edema.  Neuro: Grossly intact Psych: Normal affect.   LABS: CBC:  Recent Labs  10/04/15 0528 10/05/15 0424  WBC 9.1 7.0  HGB 12.8 11.6*  HCT 38.2 34.3*  MCV 94.8 95.3  PLT 232 774   Basic Metabolic Panel:  Recent Labs  10/04/15 0528 10/05/15 0424  NA 138 137  K 3.7 3.3*  CL 102 101  CO2 26 25  GLUCOSE 103* 82  BUN 25* 21*  CREATININE 0.76 0.74  CALCIUM 9.3 9.1    B NATRIURETIC PEPTIDE  Date/Time Value Ref Range Status  09/30/2015 03:35 AM 460.6* 0.0 - 100.0 pg/mL Final    TELE:  NSR    ECHO: 10/01/2015 Study Conclusions - Left ventricle: The cavity size was normal. Wall thickness was increased in a pattern of mild LVH.  Systolic function was vigorous. The estimated ejection fraction was in the range of 65% to 70%. Wall motion was normal; there were no regional wall motion abnormalities. - Aortic valve: Valve area (VTI): 0.98 cm^2. Valve area (Vmax): 1.07 cm^2. Valve area (Vmean): 1.08 cm^2. - Mitral valve: There was moderate regurgitation. - Right ventricle: The cavity size was mildly dilated. - Pulmonary arteries: Systolic pressure was moderately to severely increased. PA peak pressure: 66 mm Hg (S).  Current Medications:  . aspirin EC  81 mg Oral Daily  . ezetimibe  10 mg Oral Daily  . heparin subcutaneous  5,000 Units Subcutaneous 3 times per day  . levothyroxine  100 mcg Oral QAC breakfast  . multivitamin with minerals  1 tablet Oral Daily  . nebivolol  2.5 mg Oral Daily  . omega-3 acid ethyl esters  1 g Oral Daily  . pantoprazole  40 mg Oral Daily  . rosuvastatin  2.5 mg Oral Daily  . sodium chloride  3 mL Intravenous Q12H  . vitamin E  400 Units Oral Daily      ASSESSMENT AND PLAN: 1. Acute Diastolic CHF - CHF related to MR; TEE reveals normal LV function; flail P2 with ruptured chord and severe MR. Cath reveals  nonobstructive CAD. Dr Roxan Hockey plans to arrange MV repair with Dr Roxy Manns when schedule allows. Continue present dose of lasix. Ambulate. If her symptoms are controlled with diuretics, she could potentially be DCed to stay with a family member until MV repair. If she continues with significant DOE, would need to remain in hospital until surgery.  2. Severe MR - Plan as outlined above  3. HLD - continue statin therapy  Signed, [place] Parents

## 2015-10-05 NOTE — Progress Notes (Signed)
1 Day Post-Op Procedure(s) (LRB): Right/Left Heart Cath and Coronary Angiography (N/A) Subjective: Feels better but still gets SOB with relatively minor activity   Objective: Vital signs in last 24 hours: Temp:  [97.7 F (36.5 C)-98.3 F (36.8 C)] 97.7 F (36.5 C) (10/07 1232) Pulse Rate:  [65-94] 94 (10/07 1236) Cardiac Rhythm:  [-] Normal sinus rhythm (10/07 0700) Resp:  [17-19] 17 (10/07 1232) BP: (108-134)/(44-48) 120/48 mmHg (10/07 1232) SpO2:  [97 %-100 %] 97 % (10/07 1236) Weight:  [114 lb 1.6 oz (51.755 kg)] 114 lb 1.6 oz (51.755 kg) (10/07 0500)  Hemodynamic parameters for last 24 hours:    Intake/Output from previous day: 10/06 0701 - 10/07 0700 In: 631.7 [P.O.:460; I.V.:171.7] Out: 200 [Urine:200] Intake/Output this shift: Total I/O In: 700 [P.O.:700] Out: -   General appearance: alert, cooperative and no distress Heart: regular rate and rhythm and systolic murmur Lungs: rales bibasilar  Lab Results:  Recent Labs  10/04/15 0528 10/05/15 0424  WBC 9.1 7.0  HGB 12.8 11.6*  HCT 38.2 34.3*  PLT 232 209   BMET:  Recent Labs  10/04/15 0528 10/05/15 0424  NA 138 137  K 3.7 3.3*  CL 102 101  CO2 26 25  GLUCOSE 103* 82  BUN 25* 21*  CREATININE 0.76 0.74  CALCIUM 9.3 9.1    PT/INR:  Recent Labs  10/04/15 0706  LABPROT 13.6  INR 1.02   ABG    Component Value Date/Time   PHART 7.299* 10/04/2015 1414   HCO3 22.3 10/04/2015 1414   TCO2 24 10/04/2015 1414   ACIDBASEDEF 4.0* 10/04/2015 1414   O2SAT 96.0 10/04/2015 1414   CBG (last 3)  No results for input(s): GLUCAP in the last 72 hours.  Assessment/Plan: S/P Procedure(s) (LRB): Right/Left Heart Cath and Coronary Angiography (N/A) -  I met with Mrs/ Yodice and her daughter [name] again today. After talking with our office it would likely be ~ 2 weeks before she could have minimally invasive right chest repair. She prefers to have it done sooner. Therefore we will plan to go ahead on  Wednesday 10/10/15.  I discussed the operation with them. She is aware of the general nature of the procedure, the incisions to be used, the hospital stay and overall recovery. I reviewed the indications, risks, benefits and alternatives. She understands the risks include but are not limited to death, MI, DVT, PE, stroke, bleeding, blood transfusions, infection, heart block, arrhythmias, and other organ system dysfunction including respiratory or renal failure and Gi complications. She accepts the risks and agrees to proceed.  Plan OR next Wednesday   LOS: 5 days    [name] 10/05/2015

## 2015-10-05 NOTE — Progress Notes (Signed)
Pt AOx4. Pt instructed of high risk fall potential while hospitalized. Pt refused bed alarm precaution and wishes to "get up ad lib" with risk. Will continue to utilize other fall risk precautions with pt.   Cyle Kenyon RN 

## 2015-10-05 NOTE — Progress Notes (Signed)
Pre-op Cardiac Surgery  Carotid Findings:  1-39% ICA stenosis.  Vertebral artery flow is antegrade.   Upper Extremity Right Left  Brachial Pressures 129T 126T  Radial Waveforms T T  Ulnar Waveforms T T  Palmar Arch (Allen's Test) Doppler signal remains normal with radial compression and diminishes 50% with ulnar compression Doppler signal remains normal with radial compression and obliterates with ulnar compression   Findings:      Lower  Extremity Right Left  Dorsalis Pedis    Anterior Tibial    Posterior Tibial    Ankle/Brachial Indices      Findings:

## 2015-10-05 NOTE — Progress Notes (Signed)
[facility] PROGRESS NOTE  DANN VENTRESS DUK:[id number] DOB: 07/03/1929 DOA: 09/30/2015  PCP: [name], MD  Brief HPI: 81 year old female with a history of hypertension, carotid stenosis workup around 3 AM on 09/30/2015 with shortness of breath. The patient was found to be in respiratory distress unable to speak full sentences. EMS was activated and the patient was found to have oxygen saturation of 80%. The patient was placed on BiPAP and started on intravenous furosemide with good clinical effect. Chest x-ray revealed pulmonary edema. CT angiogram of the chest was negative for pulmonary embolus. Patient was found to have severe mitral regurgitation.  Past medical history:  Past Medical History  Diagnosis Date  . GERD (gastroesophageal reflux disease)   . Hyperlipidemia   . Hypothyroidism   . Osteoarthritis   . Osteoporosis   . Episodic recurrent vertigo     MRI Head 2003  . Allergy   . Bladder polyps   . Subclavian steal syndrome     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; CTA 2013 revealed open graft  . Hyperplastic colon polyp   . Esophageal spasm   . History of hepatitis     unknown type  . Asymptomatic carotid artery stenosis     R ICA 40% stenosis on CTA 12/2011.  [name] Anemia   . Cataract     BILATERAL-REMOVED  . Elevated blood pressure 03/25/2011    Bp readings borderline today has hx of elvation  In office and ok at home   Not checke recently   She gfeels was elevated from anxiety    . Diverticulosis   . Intestinal metaplasia of gastric mucosa   . Hepatic hemangioma     Consultants: Cardiology  Procedures:  Echocardiogram Study Conclusions - Left ventricle: The cavity size was normal. Wall thickness wasincreased in a pattern of mild LVH. Systolic function wasvigorous. The estimated ejection fraction was in the range of 65%to 70%. Wall motion was normal; there were no regional wallmotion abnormalities. - Aortic valve: Valve area (VTI):  0.98 cm^2. Valve area (Vmax):1.07 cm^2. Valve area (Vmean): 1.08 cm^2. - Mitral valve: There was moderate regurgitation. - Right ventricle: The cavity size was mildly dilated. - Pulmonary arteries: Systolic pressure was moderately to severelyincreased. PA peak pressure: 66 mm Hg (S).  TEE 10/5 Study Conclusions - Left ventricle: The cavity size was normal. Wall thickness wasincreased in a pattern of mild LVH. Systolic function was normal.The estimated ejection fraction was in the range of 60% to 65%.Wall motion was normal; there were no regional wall motionabnormalities. - Aortic valve: There was no stenosis. - Aorta: The aorta was normal in caliber with grade III plaque inthe descending thoracic aorta. - Mitral valve: The mitral valve was thickened/myxomatous. Therewas a partial flail P2 segment with possible ruptured chordnoted. There was severe, anteriorly-directed mitral regurgitation(unable to do PISA due to eccentricity). There was pulmonary veinsystolic flow reversal on PW doppler imaging. - Left atrium: No evidence of thrombus in the atrial cavity orappendage. - Right ventricle: The cavity size was normal. Systolic functionwas normal. - Right atrium: No evidence of thrombus in the atrial cavity orappendage. - Tricuspid valve: There was mild-moderate regurgitation. PeakRV-RA gradient 61 mmHg.  Right and left heart catheterization scheduled for 10/6 1. Prox LAD to Mid LAD lesion, 30% stenosed. 2. Ost 1st Diag to 1st Diag lesion, 60% stenosed. 3. Prox RCA to Mid RCA lesion, 30% stenosed.   Moderate to severe mitral regurgitation by ventriculography.  Preserved left ventricular systolic function  with ejection fraction of 60%. No regional wall motion abnormality is noted.  Widely patent coronary arteries with minimal luminal irregularities noted in the LAD, diagonal, and mid right coronary.  Mild pulmonary hypertension. Significant V wave spiking up to 35  mmHg.  Recommendations:  Surgical therapy per [facility], Dr. Roxan Hockey.  Sheath removal by manual compression of the right femoral area.  Monitor for evidence of hematoma.  Antibiotics: none  Subjective: Patient continues to get short of breath with minimal exertion. Denies any chest pain. No nausea, vomiting.   Objective: Vital Signs  Filed Vitals:   10/04/15 1800 10/04/15 2018 10/05/15 0046 10/05/15 0500  BP: 110/48 134/44 108/47 113/44  Pulse: 68 77 76 79  Temp:  97.8 F (36.6 C) 98.3 F (36.8 C) 98 F (36.7 C)  TempSrc:  Oral Oral Oral  Resp: 18 18 19 18   Height:      Weight:    51.755 kg (114 lb 1.6 oz)  SpO2: 100% 100% 99% 100%    Intake/Output Summary (Last 24 hours) at 10/05/15 0757 Last data filed at 10/05/15 0600  Gross per 24 hour  Intake 631.67 ml  Output    200 ml  Net 431.67 ml   Filed Weights   10/03/15 0443 10/04/15 0457 10/05/15 0500  Weight: 51.2 kg (112 lb 14 oz) 51.529 kg (113 lb 9.6 oz) 51.755 kg (114 lb 1.6 oz)    General appearance: alert, cooperative, appears stated age and no distress Resp: few crackles at the bases.  Cardio: regular rate and rhythm, S1, S2 normal, loud systolic murmur at apex, click, rub or gallop GI: soft, non-tender; bowel sounds normal; no masses,  no organomegaly Neurologic: alert and oriented 3. No focal neurological deficits are noted.  Lab Results:  Basic Metabolic Panel:  Recent Labs Lab 09/30/15 0335 09/30/15 1010 10/01/15 0235 10/02/15 0522 10/04/15 0528  NA 137  --  138 137 138  K 3.9  --  4.1 3.8 3.7  CL 106  --  108 100* 102  CO2 16*  --  23 29 26   GLUCOSE 328*  --  116* 107* 103*  BUN 15  --  21* 17 25*  CREATININE 1.08* 0.82  0.79 0.89 0.93 0.76  CALCIUM 9.0  --  8.6* 8.9 9.3  MG  --  2.1  --   --   --    Liver Function Tests:  Recent Labs Lab 09/30/15 1010 10/01/15 0235  AST 30 21  ALT 20 13*  ALKPHOS 57 50  BILITOT 0.5 0.9  PROT 5.8* 5.8*  ALBUMIN 3.2* 3.0*   CBC:  Recent  Labs Lab 09/30/15 0335 09/30/15 1010 10/01/15 0235 10/02/15 0522 10/04/15 0528  WBC 14.2* 10.1  10.3 11.1* 9.1 9.1  NEUTROABS 5.0  --   --   --   --   HGB 14.2 13.5  13.5 12.3 12.9 12.8  HCT 44.6 41.2  40.9 38.1 38.8 38.2  MCV 97.8 94.7  94.5 96.5 95.3 94.8  PLT 246 208  203 191 186 232   Cardiac Enzymes:  Recent Labs Lab 09/30/15 0335 09/30/15 1010 09/30/15 1453 09/30/15 2030  TROPONINI <0.03 0.08* 0.07* 0.04*   BNP (last 3 results)  Recent Labs  09/30/15 0335  BNP 460.6*    Recent Results (from the past 240 hour(s))  Blood culture (routine x 2)     Status: None (Preliminary result)   Collection Time: 09/30/15  6:53 AM  Result Value Ref Range Status   Specimen  Description BLOOD RIGHT ANTECUBITAL  Final   Special Requests BOTTLES DRAWN AEROBIC AND ANAEROBIC 5CC  Final   Culture NO GROWTH 4 DAYS  Final   Report Status PENDING  Incomplete  Blood culture (routine x 2)     Status: None (Preliminary result)   Collection Time: 09/30/15  6:55 AM  Result Value Ref Range Status   Specimen Description BLOOD RIGHT HAND  Final   Special Requests BOTTLES DRAWN AEROBIC ONLY 3CC  Final   Culture NO GROWTH 4 DAYS  Final   Report Status PENDING  Incomplete  MRSA PCR Screening     Status: None   Collection Time: 09/30/15 10:57 AM  Result Value Ref Range Status   MRSA by PCR NEGATIVE NEGATIVE Final    Comment:        The GeneXpert MRSA Assay (FDA approved for NASAL specimens only), is one component of a comprehensive MRSA colonization surveillance program. It is not intended to diagnose MRSA infection nor to guide or monitor treatment for MRSA infections.   C difficile quick scan w PCR reflex     Status: None   Collection Time: 10/03/15  5:53 PM  Result Value Ref Range Status   C Diff antigen NEGATIVE NEGATIVE Final   C Diff toxin NEGATIVE NEGATIVE Final   C Diff interpretation Negative for toxigenic C. difficile  Final      Studies/Results: No results  found.  Medications:  Scheduled: . aspirin EC  81 mg Oral Daily  . ezetimibe  10 mg Oral Daily  . heparin subcutaneous  5,000 Units Subcutaneous 3 times per day  . levothyroxine  100 mcg Oral QAC breakfast  . multivitamin with minerals  1 tablet Oral Daily  . nebivolol  2.5 mg Oral Daily  . omega-3 acid ethyl esters  1 g Oral Daily  . pantoprazole  40 mg Oral Daily  . rosuvastatin  2.5 mg Oral Daily  . sodium chloride  3 mL Intravenous Q12H  . vitamin E  400 Units Oral Daily   Continuous:   XTK:WIOXBD chloride, ondansetron **OR** ondansetron (ZOFRAN) IV, polyvinyl alcohol, sodium chloride, sodium chloride  Assessment/Plan:  Principal Problem:   Respiratory distress Active Problems:   Hypothyroidism   GERD   Hypertension, isolated systolic   Acute on chronic diastolic congestive heart failure, NYHA class 3 (HCC)   Paroxysmal atrial fibrillation (HCC)   Acute respiratory failure with hypoxia and hypercapnia (HCC)   Mitral regurgitation   Left hip pain    Acute Respiratory Failure with hypoxia and hypercapnea -Resolved. She was weaned off of BiPAP after 48 hours. -this is thought to be secondary to CHF with underlying COPD -CT angiogram chest--negative for pulmonary embolus but revealed small bilateral pleural effusions with emphysema; interstitial and alveolar edema present  Acute Diastolic CHF secondary to Severe mitral regurgitation/Pulmonary HTN -Patient has improved. Though she still gets quite dyspneic with minimal exertion. Cardiology is following and managing. Continue Lasix. -10/01/2015 echo--EF 65-70 percent, moderate MR, PAP 66 -Cardiology was consulted. They suspect that her degree of mitral regurgitation is worse than what appears on the echocardiogram. TEE was subsequently performed. Severe mitral regurgitation was noted. Patient underwent cardiac catheterization 10/6. No significant coronary artery disease was noted. She has been seen by cardiothoracic surgery.  Plan is for mitral valve repair during this hospitalization. Timing of this surgery is to be determined by specialists. -she continues to be in negative fluid balance. Continues to lose weight due to diuresis. -continue Bystolic - initially started  on antibiotics, which have been discontinued.  Questionable arrhythmia -Initially thought to be in atrial fibrillation, however, cardiology does not think that this was the case. Anticoagulation has been discontinued for now. Will defer to cardiology regarding this matter. TSH--0.586  Elevated troponin -Secondary to demand ischemia in the setting of diastolic CHF -No symptoms of angina  Hyperlipidemia -Continue Crestor  Hypothyroidism -Continue Synthroid -TSH 0.586  GERD -continue PPI  L-hip pain -xray left hip is unremarkable. Continue analgesic agents.  DVT Prophylaxis: subcutaneous heparin    Code Status: full code  Family Communication: discussed with patient  Disposition Plan: await mitral valve repair    LOS: 5 days   [facility] Pager [phone] 10/05/2015, 7:57 AM  If 7PM-7AM, please contact night-coverage at [email or web address], password [facility]

## 2015-10-06 DIAGNOSIS — J9602 Acute respiratory failure with hypercapnia: Secondary | ICD-10-CM

## 2015-10-06 DIAGNOSIS — I480 Paroxysmal atrial fibrillation: Secondary | ICD-10-CM

## 2015-10-06 DIAGNOSIS — J9601 Acute respiratory failure with hypoxia: Secondary | ICD-10-CM

## 2015-10-06 DIAGNOSIS — I5031 Acute diastolic (congestive) heart failure: Secondary | ICD-10-CM

## 2015-10-06 DIAGNOSIS — I10 Essential (primary) hypertension: Secondary | ICD-10-CM

## 2015-10-06 LAB — BASIC METABOLIC PANEL
Anion gap: 11 (ref 5–15)
BUN: 22 mg/dL — AB (ref 6–20)
CO2: 26 mmol/L (ref 22–32)
CREATININE: 0.76 mg/dL (ref 0.44–1.00)
Calcium: 9.6 mg/dL (ref 8.9–10.3)
Chloride: 105 mmol/L (ref 101–111)
GFR calc Af Amer: >60 mL/min (ref >60)
GLUCOSE: 104 mg/dL — AB (ref 65–99)
POTASSIUM: 3.9 mmol/L (ref 3.5–5.1)
SODIUM: 142 mmol/L (ref 135–145)

## 2015-10-06 MED ORDER — FUROSEMIDE 40 MG PO TABS
40.0000 mg | ORAL_TABLET | Freq: Every day | ORAL | Status: DC
  Administered 2015-10-06: 40 mg via ORAL
  Filled 2015-10-06: qty 1

## 2015-10-06 MED ORDER — POTASSIUM CHLORIDE CRYS ER 20 MEQ PO TBCR
20.0000 meq | EXTENDED_RELEASE_TABLET | Freq: Once | ORAL | Status: AC
  Administered 2015-10-06: 20 meq via ORAL
  Filled 2015-10-06: qty 1

## 2015-10-06 NOTE — Progress Notes (Signed)
Patient Name: Amy Fischer Date of Encounter: 10/06/2015   Primary Cardiologist: New - Dr. Stanford Breed Patient Profile: 81 y.o. female with PMH of HLD, Hypothyroidism, [name] who presented to [facility] ED on 09/30/2015 for shortness of breath. Found to have acute diastolic CHF likely secondary to severe MR.   SUBJECTIVE: No chest pain; c/o DOE  . aspirin EC  81 mg Oral Daily  . ezetimibe  10 mg Oral Daily  . furosemide  40 mg Oral Daily  . heparin subcutaneous  5,000 Units Subcutaneous 3 times per day  . levothyroxine  100 mcg Oral QAC breakfast  . multivitamin with minerals  1 tablet Oral Daily  . nebivolol  2.5 mg Oral Daily  . omega-3 acid ethyl esters  1 g Oral Daily  . pantoprazole  40 mg Oral Daily  . rosuvastatin  2.5 mg Oral Daily  . sodium chloride  3 mL Intravenous Q12H  . vitamin E  400 Units Oral Daily   OBJECTIVE Filed Vitals:   10/05/15 1742 10/05/15 2036 10/06/15 0551 10/06/15 0939  BP: 101/47 126/46 116/49 151/55  Pulse: 79  84 94  Temp: 97.7 F (36.5 C) 98.1 F (36.7 C) 98.9 F (37.2 C) 98.6 F (37 C)  TempSrc: Oral Oral Oral Oral  Resp: 17 17 16 16   Height:      Weight:      SpO2: 98% 98% 97% 95%    Intake/Output Summary (Last 24 hours) at 10/06/15 1026 Last data filed at 10/06/15 1006  Gross per 24 hour  Intake    690 ml  Output    801 ml  Net   -111 ml   Filed Weights   10/03/15 0443 10/04/15 0457 10/05/15 0500  Weight: 112 lb 14 oz (51.2 kg) 113 lb 9.6 oz (51.529 kg) 114 lb 1.6 oz (51.755 kg)    PHYSICAL EXAM General: Well developed, well nourished, female in no acute distress. Head: Normocephalic, atraumatic.  Neck: Supple Lungs:  CTA Heart: RRR, 3/6 SEM Abdomen: Soft, non-tender, non-distended, no masses.  Extremities: No edema.  Neuro: Grossly intact Psych: Normal affect.  LABS: CBC:  Recent Labs  10/04/15 0528 10/05/15 0424  WBC 9.1 7.0  HGB 12.8 11.6*  HCT 38.2 34.3*  MCV 94.8 95.3  PLT 232  716   Basic Metabolic Panel:  Recent Labs  10/05/15 0424 10/06/15 0400  NA 137 142  K 3.3* 3.9  CL 101 105  CO2 25 26  GLUCOSE 82 104*  BUN 21* 22*  CREATININE 0.74 0.76  CALCIUM 9.1 9.6    B NATRIURETIC PEPTIDE  Date/Time Value Ref Range Status  09/30/2015 03:35 AM 460.6* 0.0 - 100.0 pg/mL Final    TELE:  NS[facility]    ECHO: 10/01/2015 Study Conclusions - Left ventricle: The cavity size was normal. Wall thickness was increased in a pattern of mild LVH. Systolic function was vigorous. The estimated ejection fraction was in the range of 65% to 70%. Wall motion was normal; there were no regional wall motion abnormalities. - Aortic valve: Valve area (VTI): 0.98 cm^2. Valve area (Vmax): 1.07 cm^2. Valve area (Vmean): 1.08 cm^2. - Mitral valve: There was moderate regurgitation. - Right ventricle: The cavity size was mildly dilated. - Pulmonary arteries: Systolic pressure was moderately to severely increased. PA peak pressure: 66 mm Hg (S).  Current Medications:  . aspirin EC  81 mg Oral Daily  . ezetimibe  10 mg Oral Daily  . furosemide  40 mg Oral Daily  . heparin subcutaneous  5,000 Units Subcutaneous 3 times per day  . levothyroxine  100 mcg Oral QAC breakfast  . multivitamin with minerals  1 tablet Oral Daily  . nebivolol  2.5 mg Oral Daily  . omega-3 acid ethyl esters  1 g Oral Daily  . pantoprazole  40 mg Oral Daily  . rosuvastatin  2.5 mg Oral Daily  . sodium chloride  3 mL Intravenous Q12H  . vitamin E  400 Units Oral Daily      ASSESSMENT AND PLAN:  1. Acute Diastolic CHF - CHF related to MR; TEE reveals normal LV function; flail P2 with ruptured chord and severe MR. Cath reveals nonobstructive CAD. Dr Roxan Hockey plans to arrange MV repair with Dr Roxy Manns on 10/12. Ambulate.   2. Severe MR - Plan as outlined above  3. HLD - continue statin therapy  Signed, Ena Dawley HMD

## 2015-10-06 NOTE — Progress Notes (Signed)
CARDIAC REHAB PHASE I   Patient had just gotten back from walking with nursing student when I entered the room. Patient encouraged to walk again after dinner.

## 2015-10-06 NOTE — Progress Notes (Signed)
[facility] PROGRESS NOTE  Amy Fischer ZOX:[id number] DOB: [birth year] DOA: 09/30/2015  PCP: [name], MD  Brief HPI: 81 year old female with a history of hypertension, carotid stenosis workup around 3 AM on 09/30/2015 with shortness of breath. The patient was found to be in respiratory distress unable to speak full sentences. EMS was activated and the patient was found to have oxygen saturation of 80%. The patient was placed on BiPAP and started on intravenous furosemide with good clinical effect. Chest x-ray revealed pulmonary edema. CT angiogram of the chest was negative for pulmonary embolus. Patient was found to have severe mitral regurgitation. Patient was seen by cardiothoracic surgery. Plan is for mitral valve replacement/repair on Wednesday, October 13.  Past medical history:  Past Medical History  Diagnosis Date  . GERD (gastroesophageal reflux disease)   . Hyperlipidemia   . Hypothyroidism   . Osteoarthritis   . Osteoporosis   . Episodic recurrent vertigo     MRI Head 2003  . Allergy   . Bladder polyps   . Subclavian steal syndrome     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; CTA 2013 revealed open graft  . Hyperplastic colon polyp   . Esophageal spasm   . History of hepatitis     unknown type  . Asymptomatic carotid artery stenosis     R ICA 40% stenosis on CTA 12/2011.  [name] Anemia   . Cataract     BILATERAL-REMOVED  . Elevated blood pressure 03/25/2011    Bp readings borderline today has hx of elvation  In office and ok at home   Not checke recently   She gfeels was elevated from anxiety    . Diverticulosis   . Intestinal metaplasia of gastric mucosa   . Hepatic hemangioma     Consultants: Cardiology  Procedures:  Echocardiogram Study Conclusions - Left ventricle: The cavity size was normal. Wall thickness wasincreased in a pattern of mild LVH. Systolic function wasvigorous. The estimated ejection fraction was in the range of 65%to  70%. Wall motion was normal; there were no regional wallmotion abnormalities. - Aortic valve: Valve area (VTI): 0.98 cm^2. Valve area (Vmax):1.07 cm^2. Valve area (Vmean): 1.08 cm^2. - Mitral valve: There was moderate regurgitation. - Right ventricle: The cavity size was mildly dilated. - Pulmonary arteries: Systolic pressure was moderately to severelyincreased. PA peak pressure: 66 mm Hg (S).  TEE 10/5 Study Conclusions - Left ventricle: The cavity size was normal. Wall thickness wasincreased in a pattern of mild LVH. Systolic function was normal.The estimated ejection fraction was in the range of 60% to 65%.Wall motion was normal; there were no regional wall motionabnormalities. - Aortic valve: There was no stenosis. - Aorta: The aorta was normal in caliber with grade III plaque inthe descending thoracic aorta. - Mitral valve: The mitral valve was thickened/myxomatous. Therewas a partial flail P2 segment with possible ruptured chordnoted. There was severe, anteriorly-directed mitral regurgitation(unable to do PISA due to eccentricity). There was pulmonary veinsystolic flow reversal on PW doppler imaging. - Left atrium: No evidence of thrombus in the atrial cavity orappendage. - Right ventricle: The cavity size was normal. Systolic functionwas normal. - Right atrium: No evidence of thrombus in the atrial cavity orappendage. - Tricuspid valve: There was mild-moderate regurgitation. PeakRV-RA gradient 61 mmHg.  Right and left heart catheterization scheduled for 10/6 1. Prox LAD to Mid LAD lesion, 30% stenosed. 2. Ost 1st Diag to 1st Diag lesion, 60% stenosed. 3. Prox RCA to Mid RCA lesion, 30%  stenosed.   Moderate to severe mitral regurgitation by ventriculography.  Preserved left ventricular systolic function with ejection fraction of 60%. No regional wall motion abnormality is noted.  Widely patent coronary arteries with minimal luminal irregularities noted in the LAD,  diagonal, and mid right coronary.  Mild pulmonary hypertension. Significant V wave spiking up to 35 mmHg.  Recommendations:  Surgical therapy per [facility], Dr. Roxan Hockey.  Sheath removal by manual compression of the right femoral area.  Monitor for evidence of hematoma.  Antibiotics: none  Subjective: Patient feels well this morning. Continues to get short of breath with minimal exertion. Denies any chest pain. No nausea, vomiting.   Objective: Vital Signs  Filed Vitals:   10/05/15 1236 10/05/15 1742 10/05/15 2036 10/06/15 0551  BP:  101/47 126/46 116/49  Pulse: 94 79  84  Temp:  97.7 F (36.5 C) 98.1 F (36.7 C) 98.9 F (37.2 C)  TempSrc:  Oral Oral Oral  Resp:  17 17 16   Height:      Weight:      SpO2: 97% 98% 98% 97%    Intake/Output Summary (Last 24 hours) at 10/06/15 0842 Last data filed at 10/06/15 0800  Gross per 24 hour  Intake   1380 ml  Output    802 ml  Net    578 ml   Filed Weights   10/03/15 0443 10/04/15 0457 10/05/15 0500  Weight: 51.2 kg (112 lb 14 oz) 51.529 kg (113 lb 9.6 oz) 51.755 kg (114 lb 1.6 oz)    General appearance: alert, cooperative, appears stated age and no distress Resp: Improved crackles bilateral bases. No wheezing. Cardio: regular rate and rhythm, S1, S2 normal, loud systolic murmur at apex, click, rub or gallop GI: soft, non-tender; bowel sounds normal; no masses,  no organomegaly Neurologic: alert and oriented 3. No focal neurological deficits are noted.  Lab Results:  Basic Metabolic Panel:  Recent Labs Lab 09/30/15 1010 10/01/15 0235 10/02/15 0522 10/04/15 0528 10/05/15 0424 10/06/15 0400  NA  --  138 137 138 137 142  K  --  4.1 3.8 3.7 3.3* 3.9  CL  --  108 100* 102 101 105  CO2  --  23 29 26 25 26   GLUCOSE  --  116* 107* 103* 82 104*  BUN  --  21* 17 25* 21* 22*  CREATININE 0.82  0.79 0.89 0.93 0.76 0.74 0.76  CALCIUM  --  8.6* 8.9 9.3 9.1 9.6  MG 2.1  --   --   --   --   --    Liver Function  Tests:  Recent Labs Lab 09/30/15 1010 10/01/15 0235  AST 30 21  ALT 20 13*  ALKPHOS 57 50  BILITOT 0.5 0.9  PROT 5.8* 5.8*  ALBUMIN 3.2* 3.0*   CBC:  Recent Labs Lab 09/30/15 0335 09/30/15 1010 10/01/15 0235 10/02/15 0522 10/04/15 0528 10/05/15 0424  WBC 14.2* 10.1  10.3 11.1* 9.1 9.1 7.0  NEUTROABS 5.0  --   --   --   --   --   HGB 14.2 13.5  13.5 12.3 12.9 12.8 11.6*  HCT 44.6 41.2  40.9 38.1 38.8 38.2 34.3*  MCV 97.8 94.7  94.5 96.5 95.3 94.8 95.3  PLT 246 208  203 191 186 232 209   Cardiac Enzymes:  Recent Labs Lab 09/30/15 0335 09/30/15 1010 09/30/15 1453 09/30/15 2030  TROPONINI <0.03 0.08* 0.07* 0.04*   BNP (last 3 results)  Recent Labs  09/30/15 0335  BNP 460.6*    Recent Results (from the past 240 hour(s))  Blood culture (routine x 2)     Status: None   Collection Time: 09/30/15  6:53 AM  Result Value Ref Range Status   Specimen Description BLOOD RIGHT ANTECUBITAL  Final   Special Requests BOTTLES DRAWN AEROBIC AND ANAEROBIC 5CC  Final   Culture NO GROWTH 5 DAYS  Final   Report Status 10/05/2015 FINAL  Final  Blood culture (routine x 2)     Status: None   Collection Time: 09/30/15  6:55 AM  Result Value Ref Range Status   Specimen Description BLOOD RIGHT HAND  Final   Special Requests BOTTLES DRAWN AEROBIC ONLY 3CC  Final   Culture NO GROWTH 5 DAYS  Final   Report Status 10/05/2015 FINAL  Final  MRSA PCR Screening     Status: None   Collection Time: 09/30/15 10:57 AM  Result Value Ref Range Status   MRSA by PCR NEGATIVE NEGATIVE Final    Comment:        The GeneXpert MRSA Assay (FDA approved for NASAL specimens only), is one component of a comprehensive MRSA colonization surveillance program. It is not intended to diagnose MRSA infection nor to guide or monitor treatment for MRSA infections.   C difficile quick scan w PCR reflex     Status: None   Collection Time: 10/03/15  5:53 PM  Result Value Ref Range Status   C Diff  antigen NEGATIVE NEGATIVE Final   C Diff toxin NEGATIVE NEGATIVE Final   C Diff interpretation Negative for toxigenic C. difficile  Final      Studies/Results: No results found.  Medications:  Scheduled: . aspirin EC  81 mg Oral Daily  . ezetimibe  10 mg Oral Daily  . furosemide  40 mg Oral Daily  . heparin subcutaneous  5,000 Units Subcutaneous 3 times per day  . levothyroxine  100 mcg Oral QAC breakfast  . multivitamin with minerals  1 tablet Oral Daily  . nebivolol  2.5 mg Oral Daily  . omega-3 acid ethyl esters  1 g Oral Daily  . pantoprazole  40 mg Oral Daily  . potassium chloride  20 mEq Oral Once  . rosuvastatin  2.5 mg Oral Daily  . sodium chloride  3 mL Intravenous Q12H  . vitamin E  400 Units Oral Daily   Continuous:   IRC:VELFYB chloride, loperamide, ondansetron **OR** ondansetron (ZOFRAN) IV, polyvinyl alcohol, sodium chloride, sodium chloride  Assessment/Plan:  Principal Problem:   Respiratory distress Active Problems:   Hypothyroidism   GERD   Hypertension, isolated systolic   Acute on chronic diastolic congestive heart failure, NYHA class 3 (HCC)   Paroxysmal atrial fibrillation (HCC)   Acute respiratory failure with hypoxia and hypercapnia (HCC)   Mitral regurgitation   Left hip pain   Acute congestive heart failure (HCC)    Acute Diastolic CHF secondary to Severe mitral regurgitation/Pulmonary HTN -Patient has improved. Though she still gets quite dyspneic with minimal exertion. Cardiology is following and managing. Continue Lasix. -10/01/2015 echo--EF 65-70 percent, moderate MR, PAP 66 -Cardiology was consulted. They suspect that her degree of mitral regurgitation is worse than what appears on the echocardiogram. TEE was subsequently performed. Severe mitral regurgitation was noted. Patient underwent cardiac catheterization 10/6. No significant coronary artery disease was noted. She has been seen by cardiothoracic surgery. Plan is for mitral valve  repair during this hospitalization, on October 13.  -she continues to be in negative fluid balance.  Continues to lose weight due to diuresis. -continue Bystolic - initially started on antibiotics, which have been discontinued.  Acute Respiratory Failure with hypoxia and hypercapnea -Resolved. She was weaned off of BiPAP after 48 hours. -this is thought to be secondary to CHF with underlying COPD -CT angiogram chest--negative for pulmonary embolus but revealed small bilateral pleural effusions with emphysema; interstitial and alveolar edema present  Questionable arrhythmia -Initially thought to be in atrial fibrillation, however, cardiology does not think that this was the case. Anticoagulation has been discontinued for now. Will defer to cardiology regarding this matter. TSH--0.586  Elevated troponin -Secondary to demand ischemia in the setting of diastolic CHF -No symptoms of angina  Hyperlipidemia -Continue Crestor  Hypothyroidism -Continue Synthroid -TSH 0.586  GERD -continue PPI  L-hip pain -xray left hip is unremarkable. Continue analgesic agents.  DVT Prophylaxis: subcutaneous heparin    Code Status: full code  Family Communication: discussed with patient  Disposition Plan: await mitral valve repair    LOS: 6 days   [facility] Pager [phone] 10/06/2015, 8:42 AM  If 7PM-7AM, please contact night-coverage at [email or web address], password [facility]

## 2015-10-07 DIAGNOSIS — R0600 Dyspnea, unspecified: Secondary | ICD-10-CM

## 2015-10-07 LAB — BASIC METABOLIC PANEL
ANION GAP: 10 (ref 5–15)
BUN: 23 mg/dL — AB (ref 6–20)
CO2: 28 mmol/L (ref 22–32)
Calcium: 9.7 mg/dL (ref 8.9–10.3)
Chloride: 101 mmol/L (ref 101–111)
Creatinine, Ser: 0.74 mg/dL (ref 0.44–1.00)
GFR calc Af Amer: >60 mL/min (ref >60)
Glucose, Bld: 105 mg/dL — ABNORMAL HIGH (ref 65–99)
POTASSIUM: 4.2 mmol/L (ref 3.5–5.1)
SODIUM: 139 mmol/L (ref 135–145)

## 2015-10-07 MED ORDER — ACETAMINOPHEN 325 MG PO TABS
650.0000 mg | ORAL_TABLET | Freq: Four times a day (QID) | ORAL | Status: DC | PRN
  Administered 2015-10-07 – 2015-10-09 (×5): 650 mg via ORAL
  Filled 2015-10-07 (×5): qty 2

## 2015-10-07 MED ORDER — FUROSEMIDE 10 MG/ML IJ SOLN
40.0000 mg | Freq: Every day | INTRAMUSCULAR | Status: DC
  Administered 2015-10-07 – 2015-10-09 (×3): 40 mg via INTRAVENOUS
  Filled 2015-10-07 (×4): qty 4

## 2015-10-07 NOTE — Progress Notes (Signed)
[facility] PROGRESS NOTE  Amy Fischer CWC:[id number] DOB: 1929/11/14 DOA: 09/30/2015  PCP: [name], MD  Brief HPI: 81 year old female with a history of hypertension, carotid stenosis workup around 3 AM on 09/30/2015 with shortness of breath. The patient was found to be in respiratory distress unable to speak full sentences. EMS was activated and the patient was found to have oxygen saturation of 80%. The patient was placed on BiPAP and started on intravenous furosemide with good clinical effect. Chest x-ray revealed pulmonary edema. CT angiogram of the chest was negative for pulmonary embolus. Patient was found to have severe mitral regurgitation. Patient was seen by cardiothoracic surgery. Plan is for mitral valve replacement/repair on Wednesday, October 13.  Past medical history:  Past Medical History  Diagnosis Date  . GERD (gastroesophageal reflux disease)   . Hyperlipidemia   . Hypothyroidism   . Osteoarthritis   . Osteoporosis   . Episodic recurrent vertigo     MRI Head 2003  . Allergy   . Bladder polyps   . Subclavian steal syndrome     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; CTA 2013 revealed open graft  . Hyperplastic colon polyp   . Esophageal spasm   . History of hepatitis     unknown type  . Asymptomatic carotid artery stenosis     R ICA 40% stenosis on CTA 12/2011.  [name] Anemia   . Cataract     BILATERAL-REMOVED  . Elevated blood pressure 03/25/2011    Bp readings borderline today has hx of elvation  In office and ok at home   Not checke recently   She gfeels was elevated from anxiety    . Diverticulosis   . Intestinal metaplasia of gastric mucosa   . Hepatic hemangioma     Consultants: Cardiology  Procedures:  Echocardiogram Study Conclusions - Left ventricle: The cavity size was normal. Wall thickness wasincreased in a pattern of mild LVH. Systolic function wasvigorous. The estimated ejection fraction was in the range of 65%to  70%. Wall motion was normal; there were no regional wallmotion abnormalities. - Aortic valve: Valve area (VTI): 0.98 cm^2. Valve area (Vmax):1.07 cm^2. Valve area (Vmean): 1.08 cm^2. - Mitral valve: There was moderate regurgitation. - Right ventricle: The cavity size was mildly dilated. - Pulmonary arteries: Systolic pressure was moderately to severelyincreased. PA peak pressure: 66 mm Hg (S).  TEE 10/5 Study Conclusions - Left ventricle: The cavity size was normal. Wall thickness wasincreased in a pattern of mild LVH. Systolic function was normal.The estimated ejection fraction was in the range of 60% to 65%.Wall motion was normal; there were no regional wall motionabnormalities. - Aortic valve: There was no stenosis. - Aorta: The aorta was normal in caliber with grade III plaque inthe descending thoracic aorta. - Mitral valve: The mitral valve was thickened/myxomatous. Therewas a partial flail P2 segment with possible ruptured chordnoted. There was severe, anteriorly-directed mitral regurgitation(unable to do PISA due to eccentricity). There was pulmonary veinsystolic flow reversal on PW doppler imaging. - Left atrium: No evidence of thrombus in the atrial cavity orappendage. - Right ventricle: The cavity size was normal. Systolic functionwas normal. - Right atrium: No evidence of thrombus in the atrial cavity orappendage. - Tricuspid valve: There was mild-moderate regurgitation. PeakRV-RA gradient 61 mmHg.  Right and left heart catheterization scheduled for 10/6 1. Prox LAD to Mid LAD lesion, 30% stenosed. 2. Ost 1st Diag to 1st Diag lesion, 60% stenosed. 3. Prox RCA to Mid RCA lesion, 30%  stenosed.   Moderate to severe mitral regurgitation by ventriculography.  Preserved left ventricular systolic function with ejection fraction of 60%. No regional wall motion abnormality is noted.  Widely patent coronary arteries with minimal luminal irregularities noted in the LAD,  diagonal, and mid right coronary.  Mild pulmonary hypertension. Significant V wave spiking up to 35 mmHg.  Recommendations:  Surgical therapy per [facility], Dr. Roxan Hockey.  Sheath removal by manual compression of the right femoral area.  Monitor for evidence of hematoma.  Antibiotics: none  Subjective: Patient feels short of breath this morning. Couldn't sleep well overnight. Denies any chest pain.   Objective: Vital Signs  Filed Vitals:   10/06/15 1409 10/06/15 1411 10/06/15 2018 10/07/15 0357  BP:  118/45 106/48 115/50  Pulse:  79 77 78  Temp:  98.1 F (36.7 C) 98.1 F (36.7 C) 98.2 F (36.8 C)  TempSrc:  Oral Oral Oral  Resp:   17 17  Height:      Weight:    51.2 kg (112 lb 14 oz)  SpO2: 92% 99% 98% 96%    Intake/Output Summary (Last 24 hours) at 10/07/15 0806 Last data filed at 10/07/15 0800  Gross per 24 hour  Intake    250 ml  Output   1175 ml  Net   -925 ml   Filed Weights   10/04/15 0457 10/05/15 0500 10/07/15 0357  Weight: 51.529 kg (113 lb 9.6 oz) 51.755 kg (114 lb 1.6 oz) 51.2 kg (112 lb 14 oz)    General appearance: alert, cooperative, appears stated age and no distress Resp: Crackles noted bilaterally. No wheezing. Cardio: regular rate and rhythm, S1, S2 normal, loud systolic murmur at apex, click, rub or gallop GI: soft, non-tender; bowel sounds normal; no masses,  no organomegaly Neurologic: alert and oriented 3. No focal neurological deficits are noted.  Lab Results:  Basic Metabolic Panel:  Recent Labs Lab 09/30/15 1010  10/02/15 0522 10/04/15 0528 10/05/15 0424 10/06/15 0400 10/07/15 0616  NA  --   < > 137 138 137 142 139  K  --   < > 3.8 3.7 3.3* 3.9 4.2  CL  --   < > 100* 102 101 105 101  CO2  --   < > 29 26 25 26 28   GLUCOSE  --   < > 107* 103* 82 104* 105*  BUN  --   < > 17 25* 21* 22* 23*  CREATININE 0.82  0.79  < > 0.93 0.76 0.74 0.76 0.74  CALCIUM  --   < > 8.9 9.3 9.1 9.6 9.7  MG 2.1  --   --   --   --   --   --   < >  = values in this interval not displayed.  Liver Function Tests:  Recent Labs Lab 09/30/15 1010 10/01/15 0235  AST 30 21  ALT 20 13*  ALKPHOS 57 50  BILITOT 0.5 0.9  PROT 5.8* 5.8*  ALBUMIN 3.2* 3.0*   CBC:  Recent Labs Lab 09/30/15 1010 10/01/15 0235 10/02/15 0522 10/04/15 0528 10/05/15 0424  WBC 10.1  10.3 11.1* 9.1 9.1 7.0  HGB 13.5  13.5 12.3 12.9 12.8 11.6*  HCT 41.2  40.9 38.1 38.8 38.2 34.3*  MCV 94.7  94.5 96.5 95.3 94.8 95.3  PLT 208  203 191 186 232 209   Cardiac Enzymes:  Recent Labs Lab 09/30/15 1010 09/30/15 1453 09/30/15 2030  TROPONINI 0.08* 0.07* 0.04*   BNP (last 3 results)  Recent Labs  09/30/15 0335  BNP 460.6*    Recent Results (from the past 240 hour(s))  Blood culture (routine x 2)     Status: None   Collection Time: 09/30/15  6:53 AM  Result Value Ref Range Status   Specimen Description BLOOD RIGHT ANTECUBITAL  Final   Special Requests BOTTLES DRAWN AEROBIC AND ANAEROBIC 5CC  Final   Culture NO GROWTH 5 DAYS  Final   Report Status 10/05/2015 FINAL  Final  Blood culture (routine x 2)     Status: None   Collection Time: 09/30/15  6:55 AM  Result Value Ref Range Status   Specimen Description BLOOD RIGHT HAND  Final   Special Requests BOTTLES DRAWN AEROBIC ONLY 3CC  Final   Culture NO GROWTH 5 DAYS  Final   Report Status 10/05/2015 FINAL  Final  MRSA PCR Screening     Status: None   Collection Time: 09/30/15 10:57 AM  Result Value Ref Range Status   MRSA by PCR NEGATIVE NEGATIVE Final    Comment:        The GeneXpert MRSA Assay (FDA approved for NASAL specimens only), is one component of a comprehensive MRSA colonization surveillance program. It is not intended to diagnose MRSA infection nor to guide or monitor treatment for MRSA infections.   C difficile quick scan w PCR reflex     Status: None   Collection Time: 10/03/15  5:53 PM  Result Value Ref Range Status   C Diff antigen NEGATIVE NEGATIVE Final   C Diff  toxin NEGATIVE NEGATIVE Final   C Diff interpretation Negative for toxigenic C. difficile  Final      Studies/Results: No results found.  Medications:  Scheduled: . aspirin EC  81 mg Oral Daily  . ezetimibe  10 mg Oral Daily  . furosemide  40 mg Intravenous Daily  . heparin subcutaneous  5,000 Units Subcutaneous 3 times per day  . levothyroxine  100 mcg Oral QAC breakfast  . multivitamin with minerals  1 tablet Oral Daily  . nebivolol  2.5 mg Oral Daily  . omega-3 acid ethyl esters  1 g Oral Daily  . pantoprazole  40 mg Oral Daily  . rosuvastatin  2.5 mg Oral Daily  . sodium chloride  3 mL Intravenous Q12H  . vitamin E  400 Units Oral Daily   Continuous:   MOL:MBEMLJ chloride, loperamide, ondansetron **OR** ondansetron (ZOFRAN) IV, polyvinyl alcohol, sodium chloride, sodium chloride  Assessment/Plan:  Principal Problem:   Respiratory distress Active Problems:   Hypothyroidism   GERD   Hypertension, isolated systolic   Acute on chronic diastolic congestive heart failure, NYHA class 3 (HCC)   Paroxysmal atrial fibrillation (HCC)   Acute respiratory failure with hypoxia and hypercapnia (HCC)   Mitral regurgitation   Left hip pain   Acute congestive heart failure (HCC)    Acute Diastolic CHF secondary to Severe mitral regurgitation/Pulmonary HTN -Patient noted to be dyspneic this morning. She does have crackles bilaterally. We will give her Lasix intravenously. She still gets quite dyspneic with minimal exertion.  -10/01/2015 echo--EF 65-70 percent, moderate MR, PAP 66 -Cardiology was consulted. TEE was subsequently performed. Severe mitral regurgitation was noted. Patient underwent cardiac catheterization 10/6. No significant coronary artery disease was noted. She has been seen by cardiothoracic surgery. Plan is for mitral valve repair during this hospitalization, on October 13.  -she continues to be in negative fluid balance. Continues to lose weight due to  diuresis. -continue Bystolic - initially  started on antibiotics, which have been discontinued.  Acute Respiratory Failure with hypoxia and hypercapnea -Resolved. She was weaned off of BiPAP after 48 hours. -this is thought to be secondary to CHF with underlying COPD -CT angiogram chest--negative for pulmonary embolus but revealed small bilateral pleural effusions with emphysema; interstitial and alveolar edema present  Questionable arrhythmia -Initially thought to be in atrial fibrillation, however, cardiology does not think that this was the case. Anticoagulation has been discontinued for now. Will defer to cardiology regarding this matter. TSH--0.586  Elevated troponin -Secondary to demand ischemia in the setting of diastolic CHF -No symptoms of angina  Hyperlipidemia -Continue Crestor  Hypothyroidism -Continue Synthroid -TSH 0.586  GERD -continue PPI  L-hip pain -xray left hip is unremarkable. Continue analgesic agents.  DVT Prophylaxis: subcutaneous heparin    Code Status: full code  Family Communication: discussed with patient  Disposition Plan: await mitral valve repair    LOS: 7 days   [facility] Pager [phone] 10/07/2015, 8:06 AM  If 7PM-7AM, please contact night-coverage at [email or web address], password [facility]

## 2015-10-07 NOTE — Progress Notes (Signed)
Patient Name: Amy Fischer Date of Encounter: 10/07/2015  Principal Problem:   Respiratory distress Active Problems:   Hypothyroidism   GERD   Hypertension, isolated systolic   Acute on chronic diastolic congestive heart failure, NYHA class 3 (HCC)   Paroxysmal atrial fibrillation (HCC)   Acute respiratory failure with hypoxia and hypercapnia (HCC)   Mitral regurgitation   Left hip pain   Acute congestive heart failure [facility])    Primary Cardiologist: New - Dr. Stanford Fischer Patient Profile: 81 y.o. female w/ PMH of HLD, Hypothyroidism, [name] who presented to [facility] on 09/30/2015 for shortness of breath. Found to have acute diastolic CHF likely secondary to severe MR.   SUBJECTIVE: Reports ambulating up and down hallway with shortness of breath. Also having some left hip pain. Denies any chest pain or palpitations.  OBJECTIVE Filed Vitals:   10/06/15 1411 10/06/15 2018 10/07/15 0357 10/07/15 0948  BP: 118/45 106/48 115/50 112/44  Pulse: 79 77 78 73  Temp: 98.1 F (36.7 C) 98.1 F (36.7 C) 98.2 F (36.8 C) 97.6 F (36.4 C)  TempSrc: Oral Oral Oral Oral  Resp:  17 17 16   Height:      Weight:   112 lb 14 oz (51.2 kg)   SpO2: 99% 98% 96% 100%    Intake/Output Summary (Last 24 hours) at 10/07/15 1010 Last data filed at 10/07/15 1007  Gross per 24 hour  Intake    603 ml  Output   1175 ml  Net   -572 ml   Filed Weights   10/04/15 0457 10/05/15 0500 10/07/15 0357  Weight: 113 lb 9.6 oz (51.529 kg) 114 lb 1.6 oz (51.755 kg) 112 lb 14 oz (51.2 kg)   PHYSICAL EXAM General: Well developed, well nourished, female in no acute distress. Head: Normocephalic, atraumatic.  Neck: Supple without bruits, JVD not elevated. Lungs:  Resp regular and unlabored, Minimal rales at bases. Heart: RRR, S1, S2, no S3, S4, 3/6 SEM most notable at Apex; no rub. Abdomen: Soft, non-tender, non-distended with normoactive bowel sounds. No hepatomegaly. No  rebound/guarding. No obvious abdominal masses. Extremities: No clubbing, cyanosis, or edema. Distal pedal pulses are 2+ bilaterally. Neuro: Alert and oriented X 3. Moves all extremities spontaneously. Psych: Normal affect.   LABS: CBC: Recent Labs  10/05/15 0424  WBC 7.0  HGB 11.6*  HCT 34.3*  MCV 95.3  PLT 865   Basic Metabolic Panel: Recent Labs  10/06/15 0400 10/07/15 0616  NA 142 139  K 3.9 4.2  CL 105 101  CO2 26 28  GLUCOSE 104* 105*  BUN 22* 23*  CREATININE 0.76 0.74  CALCIUM 9.6 9.7   BNP:  B NATRIURETIC PEPTIDE  Date/Time Value Ref Range Status  09/30/2015 03:35 AM 460.6* 0.0 - 100.0 pg/mL Final    TELE: NSR with rate in 70's - 80's.        Current Medications:  . aspirin EC  81 mg Oral Daily  . ezetimibe  10 mg Oral Daily  . furosemide  40 mg Intravenous Daily  . heparin subcutaneous  5,000 Units Subcutaneous 3 times per day  . levothyroxine  100 mcg Oral QAC breakfast  . multivitamin with minerals  1 tablet Oral Daily  . nebivolol  2.5 mg Oral Daily  . omega-3 acid ethyl esters  1 g Oral Daily  . pantoprazole  40 mg Oral Daily  . rosuvastatin  2.5 mg Oral Daily  . sodium chloride  3  mL Intravenous Q12H  . vitamin E  400 Units Oral Daily      ASSESSMENT AND PLAN:  1. Acute Diastolic CHF - CHF secondary to Severe MR. TEE on 10/03/2015 showed flail P2 and ruptured chord with severe MR. Cath on 10/04/2015 showed nonobstructive CAD. Planning to undergo MVR on 10/10/2015.  - Continue Lasix 40mg  IV daily at current dosage. Creatinine remaining stable.  2. Severe MR - Planning to undergo Mitral Valve Repair on 10/10/2015. TCTS is following.  3. HLD - continue statin therapy  Signed, Amy Fischer , PA-C 10:10 AM 10/07/2015 Pager: [phone]   The patient was seen, examined and discussed with Amy M. Rosita Fire, PA-C and I agree with the above.   81 year old female with acute CHF secondary to Severe MR with flail P2 and ruptured  chord, awaiting MVR on 10/10/2015.  We will continue to diurese as she is still fluid overloaded. Creatinine remaining stable.  Amy Spark, MD [facility] 10/07/2015

## 2015-10-08 ENCOUNTER — Inpatient Hospital Stay (HOSPITAL_COMMUNITY): Payer: Medicare Other

## 2015-10-08 DIAGNOSIS — I340 Nonrheumatic mitral (valve) insufficiency: Secondary | ICD-10-CM | POA: Insufficient documentation

## 2015-10-08 DIAGNOSIS — R0602 Shortness of breath: Secondary | ICD-10-CM

## 2015-10-08 LAB — PULMONARY FUNCTION TEST
DL/VA % pred: 78 %
DL/VA: 3.33 ml/min/mmHg/L
DLCO COR: 9.8 ml/min/mmHg
DLCO UNC % PRED: 48 %
DLCO UNC: 9.21 ml/min/mmHg
DLCO cor % pred: 51 %
FEF 25-75 PRE: 0.29 L/s
FEF 25-75 Post: 0.35 L/sec
FEF2575-%Change-Post: 21 %
FEF2575-%PRED-PRE: 30 %
FEF2575-%Pred-Post: 37 %
FEV1-%Change-Post: 10 %
FEV1-%PRED-PRE: 56 %
FEV1-%Pred-Post: 62 %
FEV1-POST: 0.88 L
FEV1-Pre: 0.8 L
FEV1FVC-%Change-Post: 17 %
FEV1FVC-%Pred-Pre: 72 %
FEV6-%CHANGE-POST: -2 %
FEV6-%PRED-PRE: 80 %
FEV6-%Pred-Post: 77 %
FEV6-POST: 1.4 L
FEV6-Pre: 1.44 L
FEV6FVC-%CHANGE-POST: 4 %
FEV6FVC-%PRED-POST: 107 %
FEV6FVC-%Pred-Pre: 102 %
FVC-%Change-Post: -6 %
FVC-%PRED-POST: 73 %
FVC-%Pred-Pre: 77 %
FVC-Post: 1.42 L
FVC-Pre: 1.51 L
POST FEV6/FVC RATIO: 100 %
PRE FEV1/FVC RATIO: 53 %
Post FEV1/FVC ratio: 62 %
Pre FEV6/FVC Ratio: 95 %
RV % pred: 101 %
RV: 2.32 L
TLC % PRED: 96 %
TLC: 4.28 L

## 2015-10-08 LAB — BASIC METABOLIC PANEL
Anion gap: 9 (ref 5–15)
BUN: 26 mg/dL — ABNORMAL HIGH (ref 6–20)
CO2: 30 mmol/L (ref 22–32)
Calcium: 9.5 mg/dL (ref 8.9–10.3)
Chloride: 101 mmol/L (ref 101–111)
Creatinine, Ser: 0.88 mg/dL (ref 0.44–1.00)
GFR calc Af Amer: >60 mL/min (ref >60)
GFR calc non Af Amer: 59 mL/min — ABNORMAL LOW (ref >60)
Glucose, Bld: 100 mg/dL — ABNORMAL HIGH (ref 65–99)
Potassium: 4 mmol/L (ref 3.5–5.1)
Sodium: 140 mmol/L (ref 135–145)

## 2015-10-08 MED ORDER — ALBUTEROL SULFATE (2.5 MG/3ML) 0.083% IN NEBU
2.5000 mg | INHALATION_SOLUTION | Freq: Once | RESPIRATORY_TRACT | Status: AC
  Administered 2015-10-08: 2.5 mg via RESPIRATORY_TRACT

## 2015-10-08 NOTE — Progress Notes (Signed)
CARDIAC REHAB PHASE I   PRE:  Rate/Rhythm: 87 SR    BP: sitting 110/62    SaO2: 98 2L  MODE:  Ambulation: 294 ft   POST:  Rate/Rhythm: 100 ST    BP: sitting 132/67     SaO2: 96 2L  Pt fairly steady without RW. Used O2 for support and endurance. Rest at half way due to SOB. DOE after walk as well. To recliner. Discussed sternal precautions for post op. Pt to watch preop video with family today. Will f/u. [phone]   Josephina Shih [place] CES, ACSM 10/08/2015 11:32 AM

## 2015-10-08 NOTE — Progress Notes (Signed)
[facility] PROGRESS NOTE  Amy Fischer POE:[id number] DOB: [birth year] DOA: 09/30/2015  PCP: [name], MD  Brief HPI: 81 year old female with a history of hypertension, carotid stenosis workup around 3 AM on 09/30/2015 with shortness of breath. The patient was found to be in respiratory distress unable to speak full sentences. EMS was activated and the patient was found to have oxygen saturation of 80%. The patient was placed on BiPAP and started on intravenous furosemide with good clinical effect. Chest x-ray revealed pulmonary edema. CT angiogram of the chest was negative for pulmonary embolus. Patient was found to have severe mitral regurgitation. Patient was seen by cardiothoracic surgery. Plan is for mitral valve replacement/repair on Wednesday, October 13.  Past medical history:  Past Medical History  Diagnosis Date  . GERD (gastroesophageal reflux disease)   . Hyperlipidemia   . Hypothyroidism   . Osteoarthritis   . Osteoporosis   . Episodic recurrent vertigo     MRI Head 2003  . Allergy   . Bladder polyps   . Subclavian steal syndrome     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; CTA 2013 revealed open graft  . Hyperplastic colon polyp   . Esophageal spasm   . History of hepatitis     unknown type  . Asymptomatic carotid artery stenosis     R ICA 40% stenosis on CTA 12/2011.  [name] Anemia   . Cataract     BILATERAL-REMOVED  . Elevated blood pressure 03/25/2011    Bp readings borderline today has hx of elvation  In office and ok at home   Not checke recently   She gfeels was elevated from anxiety    . Diverticulosis   . Intestinal metaplasia of gastric mucosa   . Hepatic hemangioma     Consultants: Cardiology  Procedures:  Echocardiogram Study Conclusions - Left ventricle: The cavity size was normal. Wall thickness wasincreased in a pattern of mild LVH. Systolic function wasvigorous. The estimated ejection fraction was in the range of 65%to  70%. Wall motion was normal; there were no regional wallmotion abnormalities. - Aortic valve: Valve area (VTI): 0.98 cm^2. Valve area (Vmax):1.07 cm^2. Valve area (Vmean): 1.08 cm^2. - Mitral valve: There was moderate regurgitation. - Right ventricle: The cavity size was mildly dilated. - Pulmonary arteries: Systolic pressure was moderately to severelyincreased. PA peak pressure: 66 mm Hg (S).  TEE 10/5 Study Conclusions - Left ventricle: The cavity size was normal. Wall thickness wasincreased in a pattern of mild LVH. Systolic function was normal.The estimated ejection fraction was in the range of 60% to 65%.Wall motion was normal; there were no regional wall motionabnormalities. - Aortic valve: There was no stenosis. - Aorta: The aorta was normal in caliber with grade III plaque inthe descending thoracic aorta. - Mitral valve: The mitral valve was thickened/myxomatous. Therewas a partial flail P2 segment with possible ruptured chordnoted. There was severe, anteriorly-directed mitral regurgitation(unable to do PISA due to eccentricity). There was pulmonary veinsystolic flow reversal on PW doppler imaging. - Left atrium: No evidence of thrombus in the atrial cavity orappendage. - Right ventricle: The cavity size was normal. Systolic functionwas normal. - Right atrium: No evidence of thrombus in the atrial cavity orappendage. - Tricuspid valve: There was mild-moderate regurgitation. PeakRV-RA gradient 61 mmHg.  Right and left heart catheterization scheduled for 10/6 1. Prox LAD to Mid LAD lesion, 30% stenosed. 2. Ost 1st Diag to 1st Diag lesion, 60% stenosed. 3. Prox RCA to Mid RCA lesion, 30%  stenosed.   Moderate to severe mitral regurgitation by ventriculography.  Preserved left ventricular systolic function with ejection fraction of 60%. No regional wall motion abnormality is noted.  Widely patent coronary arteries with minimal luminal irregularities noted in the LAD,  diagonal, and mid right coronary.  Mild pulmonary hypertension. Significant V wave spiking up to 35 mmHg.  Recommendations:  Surgical therapy per TCTS, Dr. Roxan Hockey.  Sheath removal by manual compression of the right femoral area.  Monitor for evidence of hematoma.  Antibiotics: none  Subjective: Patient feels well. Still getting short of breath with minimal exertion. Denies any chest pain.   Objective: Vital Signs  Filed Vitals:   10/07/15 2039 10/08/15 0430 10/08/15 0943 10/08/15 1251  BP: 102/38 99/39 132/47 109/50  Pulse: 70 71 77 77  Temp: 98.3 F (36.8 C) 98 F (36.7 C) 97.7 F (36.5 C) 99.7 F (37.6 C)  TempSrc: Oral Oral Oral Oral  Resp: 16 17 18 18   Height:      Weight:  51.755 kg (114 lb 1.6 oz)    SpO2: 95% 96% 98% 96%    Intake/Output Summary (Last 24 hours) at 10/08/15 1301 Last data filed at 10/08/15 1253  Gross per 24 hour  Intake    960 ml  Output   1450 ml  Net   -490 ml   Filed Weights   10/05/15 0500 10/07/15 0357 10/08/15 0430  Weight: 51.755 kg (114 lb 1.6 oz) 51.2 kg (112 lb 14 oz) 51.755 kg (114 lb 1.6 oz)    General appearance: alert, cooperative, appears stated age and no distress Resp: Crackles noted bilaterally at the bases. No wheezing. Cardio: regular rate and rhythm, S1, S2 normal, loud systolic murmur at apex, click, rub or gallop GI: soft, non-tender; bowel sounds normal; no masses,  no organomegaly Neurologic: alert and oriented 3. No focal neurological deficits are noted.  Lab Results:  Basic Metabolic Panel:  Recent Labs Lab 10/04/15 0528 10/05/15 0424 10/06/15 0400 10/07/15 0616 10/08/15 0329  NA 138 137 142 139 140  K 3.7 3.3* 3.9 4.2 4.0  CL 102 101 105 101 101  CO2 26 25 26 28 30   GLUCOSE 103* 82 104* 105* 100*  BUN 25* 21* 22* 23* 26*  CREATININE 0.76 0.74 0.76 0.74 0.88  CALCIUM 9.3 9.1 9.6 9.7 9.5    CBC:  Recent Labs Lab 10/02/15 0522 10/04/15 0528 10/05/15 0424  WBC 9.1 9.1 7.0  HGB  12.9 12.8 11.6*  HCT 38.8 38.2 34.3*  MCV 95.3 94.8 95.3  PLT 186 232 209   BNP (last 3 results)  Recent Labs  09/30/15 0335  BNP 460.6*    Recent Results (from the past 240 hour(s))  Blood culture (routine x 2)     Status: None   Collection Time: 09/30/15  6:53 AM  Result Value Ref Range Status   Specimen Description BLOOD RIGHT ANTECUBITAL  Final   Special Requests BOTTLES DRAWN AEROBIC AND ANAEROBIC 5CC  Final   Culture NO GROWTH 5 DAYS  Final   Report Status 10/05/2015 FINAL  Final  Blood culture (routine x 2)     Status: None   Collection Time: 09/30/15  6:55 AM  Result Value Ref Range Status   Specimen Description BLOOD RIGHT HAND  Final   Special Requests BOTTLES DRAWN AEROBIC ONLY 3CC  Final   Culture NO GROWTH 5 DAYS  Final   Report Status 10/05/2015 FINAL  Final  MRSA PCR Screening     Status:  None   Collection Time: 09/30/15 10:57 AM  Result Value Ref Range Status   MRSA by PCR NEGATIVE NEGATIVE Final    Comment:        The GeneXpert MRSA Assay (FDA approved for NASAL specimens only), is one component of a comprehensive MRSA colonization surveillance program. It is not intended to diagnose MRSA infection nor to guide or monitor treatment for MRSA infections.   C difficile quick scan w PCR reflex     Status: None   Collection Time: 10/03/15  5:53 PM  Result Value Ref Range Status   C Diff antigen NEGATIVE NEGATIVE Final   C Diff toxin NEGATIVE NEGATIVE Final   C Diff interpretation Negative for toxigenic C. difficile  Final      Studies/Results: No results found.  Medications:  Scheduled: . aspirin EC  81 mg Oral Daily  . ezetimibe  10 mg Oral Daily  . furosemide  40 mg Intravenous Daily  . heparin subcutaneous  5,000 Units Subcutaneous 3 times per day  . levothyroxine  100 mcg Oral QAC breakfast  . multivitamin with minerals  1 tablet Oral Daily  . nebivolol  2.5 mg Oral Daily  . omega-3 acid ethyl esters  1 g Oral Daily  . pantoprazole  40  mg Oral Daily  . rosuvastatin  2.5 mg Oral Daily  . sodium chloride  3 mL Intravenous Q12H  . vitamin E  400 Units Oral Daily   Continuous:   DZH:GDJMEQ chloride, acetaminophen, loperamide, ondansetron **OR** ondansetron (ZOFRAN) IV, polyvinyl alcohol, sodium chloride, sodium chloride  Assessment/Plan:  Principal Problem:   Respiratory distress Active Problems:   Hypothyroidism   GERD   Hypertension, isolated systolic   Acute on chronic diastolic congestive heart failure, NYHA class 3 (HCC)   Paroxysmal atrial fibrillation (HCC)   Acute respiratory failure with hypoxia and hypercapnia (HCC)   Mitral regurgitation   Left hip pain   Acute congestive heart failure (HCC)    Acute Diastolic CHF secondary to Severe mitral regurgitation/Pulmonary HTN -Patient stable. Continue Lasix intravenously. She still gets quite dyspneic with minimal exertion.  -10/01/2015 echo--EF 65-70 percent, moderate MR, PAP 66 -Cardiology was consulted. TEE was subsequently performed. Severe mitral regurgitation was noted. Patient underwent cardiac catheterization 10/6. No significant coronary artery disease was noted. She has been seen by cardiothoracic surgery. Plan is for mitral valve repair during this hospitalization, on October 13.  -she continues to be in negative fluid balance. Continues to lose weight due to diuresis. -continue Bystolic - initially started on antibiotics, which have been discontinued.  Acute Respiratory Failure with hypoxia and hypercapnea -Resolved. She was weaned off of BiPAP after 48 hours. -this is thought to be secondary to CHF with underlying COPD -CT angiogram chest--negative for pulmonary embolus but revealed small bilateral pleural effusions with emphysema; interstitial and alveolar edema present  Questionable arrhythmia -Initially thought to be in atrial fibrillation, however, cardiology does not think that this was the case. Anticoagulation has been discontinued for  now. Will defer to cardiology regarding this matter. TSH 0.586  Elevated troponin -Secondary to demand ischemia in the setting of diastolic CHF -No symptoms of angina. Cardiac catheterization revealed nonobstructive coronary artery disease.  Hyperlipidemia -Continue Crestor  Hypothyroidism -Continue Synthroid -TSH 0.586  GERD -continue PPI  L-hip pain -xray left hip is unremarkable. Continue analgesic agents.  DVT Prophylaxis: subcutaneous heparin    Code Status: full code  Family Communication: discussed with patient  Disposition Plan: await mitral valve repair on 10/11/15  LOS: 8 days   [facility] Pager [phone] 10/08/2015, 1:01 PM  If 7PM-7AM, please contact night-coverage at [email or web address], password [facility]

## 2015-10-08 NOTE — Progress Notes (Signed)
    Subjective: Breathing better  Objective: Vital signs in last 24 hours: Temp:  [97.5 F (36.4 C)-98.3 F (36.8 C)] 97.7 F (36.5 C) (10/10 0943) Pulse Rate:  [70-77] 77 (10/10 0943) Resp:  [16-18] 18 (10/10 0943) BP: (99-132)/(38-47) 132/47 mmHg (10/10 0943) SpO2:  [95 %-100 %] 98 % (10/10 0943) Weight:  [114 lb 1.6 oz (51.755 kg)] 114 lb 1.6 oz (51.755 kg) (10/10 0430) Last BM Date: 10/07/15  Intake/Output from previous day: 10/09 0701 - 10/10 0700 In: 3614 [P.O.:1320; I.V.:3] Out: 950 [Urine:950] Intake/Output this shift: Total I/O In: 240 [P.O.:240] Out: -   Medications Scheduled Meds: . aspirin EC  81 mg Oral Daily  . ezetimibe  10 mg Oral Daily  . furosemide  40 mg Intravenous Daily  . heparin subcutaneous  5,000 Units Subcutaneous 3 times per day  . levothyroxine  100 mcg Oral QAC breakfast  . multivitamin with minerals  1 tablet Oral Daily  . nebivolol  2.5 mg Oral Daily  . omega-3 acid ethyl esters  1 g Oral Daily  . pantoprazole  40 mg Oral Daily  . rosuvastatin  2.5 mg Oral Daily  . sodium chloride  3 mL Intravenous Q12H  . vitamin E  400 Units Oral Daily   Continuous Infusions:  PRN Meds:.sodium chloride, acetaminophen, loperamide, ondansetron **OR** ondansetron (ZOFRAN) IV, polyvinyl alcohol, sodium chloride, sodium chloride  PE: General appearance: alert, cooperative, no distress and Sitting up in chair.  Lungs: Decreased BS > on the right.  Heart: regular rate and rhythm and 2/6 sys MM in the axillae Extremities: No LEE Pulses: 2+ and symmetric Skin: EWarm and dry Neurologic: Grossly normal  Lab Results:  No results for input(s): WBC, HGB, HCT, PLT in the last 72 hours. BMET  Recent Labs  10/06/15 0400 10/07/15 0616 10/08/15 0329  NA 142 139 140  K 3.9 4.2 4.0  CL 105 101 101  CO2 26 28 30   GLUCOSE 104* 105* 100*  BUN 22* 23* 26*  CREATININE 0.76 0.74 0.88  CALCIUM 9.6 9.7 9.5      Assessment/Plan   Principal Problem:  Respiratory distress Active Problems:   Hypothyroidism   GERD   Hypertension, isolated systolic   Acute on chronic diastolic congestive heart failure, NYHA class 3 (HCC)   Paroxysmal atrial fibrillation (HCC)   Acute respiratory failure with hypoxia and hypercapnia (HCC)   Mitral regurgitation   Left hip pain   Acute congestive heart failure ([place])  1. Acute Diastolic CHF -Net fluids: +0.4L CHF secondary to Severe MR. TEE on 10/03/2015 showed flail P2 and ruptured chord with severe MR. Cath on 10/04/2015 showed nonobstructive CAD. Planning to undergo MVR on 10/10/2015.  - Continue Lasix 40mg  IV daily at current dosage. BUN, Creatinine up slightly.  Continue to watch  2. Severe MR - Planning to undergo Mitral Valve Repair on 10/10/2015. TCTS is following.  3. HLD - continue statin therapy    LOS: 8 days    HAGER, BRYAN PA-C 10/08/2015 10:20 AM   Agree with note written by Luisa Dago PAC  Flail P2 with severe MR and S/O CHF. Appears comfortable and euvolemic. Loud MR murmur. On lasix . For MV repair with Dr. Roxan Hockey Wednesday.   Quay Burow 10/08/2015 1:55 PM

## 2015-10-08 NOTE — Care Management Important Message (Signed)
Important Message  Patient Details  Name: Amy Fischer MRN: [id number] Date of Birth: 08/03/1929   Medicare Important Message Given:  Yes-third notification given    [name] 10/08/2015, 2:00 PM

## 2015-10-09 ENCOUNTER — Inpatient Hospital Stay (HOSPITAL_COMMUNITY): Payer: Medicare Other

## 2015-10-09 LAB — BASIC METABOLIC PANEL
Anion gap: 10 (ref 5–15)
BUN: 24 mg/dL — AB (ref 6–20)
CHLORIDE: 100 mmol/L — AB (ref 101–111)
CO2: 29 mmol/L (ref 22–32)
CREATININE: 0.8 mg/dL (ref 0.44–1.00)
Calcium: 9.7 mg/dL (ref 8.9–10.3)
GFR calc Af Amer: >60 mL/min (ref >60)
GFR calc non Af Amer: >60 mL/min (ref >60)
Glucose, Bld: 107 mg/dL — ABNORMAL HIGH (ref 65–99)
POTASSIUM: 4.1 mmol/L (ref 3.5–5.1)
Sodium: 139 mmol/L (ref 135–145)

## 2015-10-09 MED ORDER — AMINOCAPROIC ACID 250 MG/ML IV SOLN
INTRAVENOUS | Status: AC
  Administered 2015-10-10: 70 mL/h via INTRAVENOUS
  Filled 2015-10-09: qty 40

## 2015-10-09 MED ORDER — PHENYLEPHRINE HCL 10 MG/ML IJ SOLN
30.0000 ug/min | INTRAVENOUS | Status: AC
  Administered 2015-10-10: 20 ug/min via INTRAVENOUS
  Filled 2015-10-09: qty 2

## 2015-10-09 MED ORDER — LEVOTHYROXINE SODIUM 100 MCG PO TABS: 100.0000 ug | ORAL_TABLET | Freq: Every day | ORAL | Status: DC

## 2015-10-09 MED ORDER — DEXTROSE 5 % IV SOLN
1.5000 g | INTRAVENOUS | Status: AC
  Administered 2015-10-10: 1.5 g via INTRAVENOUS
  Administered 2015-10-10: .75 g via INTRAVENOUS
  Filled 2015-10-09: qty 1.5

## 2015-10-09 MED ORDER — BISACODYL 5 MG PO TBEC: 5.0000 mg | DELAYED_RELEASE_TABLET | Freq: Once | ORAL | Status: DC

## 2015-10-09 MED ORDER — POTASSIUM CHLORIDE 2 MEQ/ML IV SOLN
80.0000 meq | INTRAVENOUS | Status: DC
  Filled 2015-10-09: qty 40

## 2015-10-09 MED ORDER — TEMAZEPAM 15 MG PO CAPS: 15.0000 mg | ORAL_CAPSULE | Freq: Once | ORAL | Status: DC | PRN

## 2015-10-09 MED ORDER — CHLORHEXIDINE GLUCONATE 0.12 % MT SOLN: 15.0000 mL | Freq: Once | OROMUCOSAL | Status: DC

## 2015-10-09 MED ORDER — VANCOMYCIN HCL IN DEXTROSE 1-5 GM/200ML-% IV SOLN
1000.0000 mg | INTRAVENOUS | Status: AC
  Administered 2015-10-10: 1000 mg via INTRAVENOUS
  Filled 2015-10-09 (×2): qty 200

## 2015-10-09 MED ORDER — CHLORHEXIDINE GLUCONATE 4 % EX LIQD
60.0000 mL | Freq: Once | CUTANEOUS | Status: AC
  Administered 2015-10-09: 4 via TOPICAL
  Filled 2015-10-09: qty 60

## 2015-10-09 MED ORDER — METOPROLOL TARTRATE 12.5 MG HALF TABLET
12.5000 mg | ORAL_TABLET | Freq: Once | ORAL | Status: AC
  Administered 2015-10-10: 12.5 mg via ORAL
  Filled 2015-10-09: qty 1

## 2015-10-09 MED ORDER — NITROGLYCERIN IN D5W 200-5 MCG/ML-% IV SOLN
2.0000 ug/min | INTRAVENOUS | Status: AC
  Administered 2015-10-10: 10 ug/min via INTRAVENOUS
  Filled 2015-10-09: qty 250

## 2015-10-09 MED ORDER — DIAZEPAM 2 MG PO TABS
2.0000 mg | ORAL_TABLET | Freq: Once | ORAL | Status: AC
  Administered 2015-10-10: 2 mg via ORAL
  Filled 2015-10-09: qty 1

## 2015-10-09 MED ORDER — ALPRAZOLAM 0.25 MG PO TABS
0.2500 mg | ORAL_TABLET | ORAL | Status: DC | PRN
  Administered 2015-10-09: 0.5 mg via ORAL
  Filled 2015-10-09: qty 2

## 2015-10-09 MED ORDER — LEVOTHYROXINE SODIUM 100 MCG PO TABS
100.0000 ug | ORAL_TABLET | Freq: Every day | ORAL | Status: DC
  Administered 2015-10-09 – 2015-10-22 (×13): 100 ug via ORAL
  Filled 2015-10-09 (×16): qty 1

## 2015-10-09 MED ORDER — SODIUM CHLORIDE 0.9 % IV SOLN
INTRAVENOUS | Status: AC
  Administered 2015-10-10: 1.5 [IU]/h via INTRAVENOUS
  Filled 2015-10-09: qty 2.5

## 2015-10-09 MED ORDER — DEXTROSE 5 % IV SOLN
750.0000 mg | INTRAVENOUS | Status: DC
  Filled 2015-10-09: qty 750

## 2015-10-09 MED ORDER — CHLORHEXIDINE GLUCONATE 4 % EX LIQD
60.0000 mL | Freq: Once | CUTANEOUS | Status: AC
  Administered 2015-10-10: 4 via TOPICAL

## 2015-10-09 MED ORDER — SODIUM CHLORIDE 0.9 % IV SOLN
INTRAVENOUS | Status: DC
  Filled 2015-10-09: qty 30

## 2015-10-09 MED ORDER — EPINEPHRINE HCL 1 MG/ML IJ SOLN
0.0000 ug/min | INTRAVENOUS | Status: DC
  Filled 2015-10-09: qty 4

## 2015-10-09 MED ORDER — DEXMEDETOMIDINE HCL IN NACL 400 MCG/100ML IV SOLN
0.1000 ug/kg/h | INTRAVENOUS | Status: AC
  Administered 2015-10-10: .3 ug/kg/h via INTRAVENOUS
  Filled 2015-10-09: qty 100

## 2015-10-09 MED ORDER — MAGNESIUM SULFATE 50 % IJ SOLN
40.0000 meq | INTRAMUSCULAR | Status: DC
Start: 2015-10-10 — End: 2015-10-10
  Filled 2015-10-09: qty 10

## 2015-10-09 MED ORDER — PLASMA-LYTE 148 IV SOLN
INTRAVENOUS | Status: DC
  Filled 2015-10-09: qty 2.5

## 2015-10-09 MED ORDER — DOPAMINE-DEXTROSE 3.2-5 MG/ML-% IV SOLN
0.0000 ug/kg/min | INTRAVENOUS | Status: AC
  Administered 2015-10-10: 3 ug/kg/min via INTRAVENOUS
  Filled 2015-10-09: qty 250

## 2015-10-09 NOTE — Progress Notes (Signed)
[facility] PROGRESS NOTE  Amy Fischer QQV:[id number] DOB: May 12, 1929 DOA: 09/30/2015  PCP: [name], MD  Brief HPI: 81 year old female with a history of hypertension, carotid stenosis workup around 3 AM on 09/30/2015 with shortness of breath. The patient was found to be in respiratory distress unable to speak full sentences. EMS was activated and the patient was found to have oxygen saturation of 80%. The patient was placed on BiPAP and started on intravenous furosemide with good clinical effect. Chest x-ray revealed pulmonary edema. CT angiogram of the chest was negative for pulmonary embolus. Patient was found to have severe mitral regurgitation. Patient was seen by cardiothoracic surgery. Plan is for mitral valve replacement/repair on Wednesday, October 13.  Past medical history:  Past Medical History  Diagnosis Date  . GERD (gastroesophageal reflux disease)   . Hyperlipidemia   . Hypothyroidism   . Osteoarthritis   . Osteoporosis   . Episodic recurrent vertigo     MRI Head 2003  . Allergy   . Bladder polyps   . Subclavian steal syndrome     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; CTA 2013 revealed open graft  . Hyperplastic colon polyp   . Esophageal spasm   . History of hepatitis     unknown type  . Asymptomatic carotid artery stenosis     R ICA 40% stenosis on CTA 12/2011.  [name] Anemia   . Cataract     BILATERAL-REMOVED  . Elevated blood pressure 03/25/2011    Bp readings borderline today has hx of elvation  In office and ok at home   Not checke recently   She gfeels was elevated from anxiety    . Diverticulosis   . Intestinal metaplasia of gastric mucosa   . Hepatic hemangioma     Consultants: Cardiology. Cardiothoracic surgery (Dr. Roxan Hockey)  Procedures:  Echocardiogram Study Conclusions - Left ventricle: The cavity size was normal. Wall thickness wasincreased in a pattern of mild LVH. Systolic function wasvigorous. The estimated  ejection fraction was in the range of 65%to 70%. Wall motion was normal; there were no regional wallmotion abnormalities. - Aortic valve: Valve area (VTI): 0.98 cm^2. Valve area (Vmax):1.07 cm^2. Valve area (Vmean): 1.08 cm^2. - Mitral valve: There was moderate regurgitation. - Right ventricle: The cavity size was mildly dilated. - Pulmonary arteries: Systolic pressure was moderately to severelyincreased. PA peak pressure: 66 mm Hg (S).  TEE 10/5 Study Conclusions - Left ventricle: The cavity size was normal. Wall thickness wasincreased in a pattern of mild LVH. Systolic function was normal.The estimated ejection fraction was in the range of 60% to 65%.Wall motion was normal; there were no regional wall motionabnormalities. - Aortic valve: There was no stenosis. - Aorta: The aorta was normal in caliber with grade III plaque inthe descending thoracic aorta. - Mitral valve: The mitral valve was thickened/myxomatous. Therewas a partial flail P2 segment with possible ruptured chordnoted. There was severe, anteriorly-directed mitral regurgitation(unable to do PISA due to eccentricity). There was pulmonary veinsystolic flow reversal on PW doppler imaging. - Left atrium: No evidence of thrombus in the atrial cavity orappendage. - Right ventricle: The cavity size was normal. Systolic functionwas normal. - Right atrium: No evidence of thrombus in the atrial cavity orappendage. - Tricuspid valve: There was mild-moderate regurgitation. PeakRV-RA gradient 61 mmHg.  Right and left heart catheterization scheduled for 10/6 1. Prox LAD to Mid LAD lesion, 30% stenosed. 2. Ost 1st Diag to 1st Diag lesion, 60% stenosed. 3. Prox RCA to  Mid RCA lesion, 30% stenosed.   Moderate to severe mitral regurgitation by ventriculography.  Preserved left ventricular systolic function with ejection fraction of 60%. No regional wall motion abnormality is noted.  Widely patent coronary arteries with  minimal luminal irregularities noted in the LAD, diagonal, and mid right coronary.  Mild pulmonary hypertension. Significant V wave spiking up to 35 mmHg.  Recommendations:  Surgical therapy per [facility], Dr. Roxan Hockey.  Sheath removal by manual compression of the right femoral area.  Monitor for evidence of hematoma.  Antibiotics: none  Subjective: Patient denies any complaints today. No chest pain or shortness of breath. Waiting for surgery tomorrow.   Objective: Vital Signs  Filed Vitals:   10/08/15 0943 10/08/15 1251 10/08/15 2100 10/09/15 0648  BP: 132/47 109/50 107/53 109/52  Pulse: 77 77 60 76  Temp: 97.7 F (36.5 C) 99.7 F (37.6 C) 98.9 F (37.2 C) 98 F (36.7 C)  TempSrc: Oral Oral Oral Oral  Resp: 18 18 18 19   Height:      Weight:    51.529 kg (113 lb 9.6 oz)  SpO2: 98% 96% 99% 97%    Intake/Output Summary (Last 24 hours) at 10/09/15 0811 Last data filed at 10/09/15 0308  Gross per 24 hour  Intake    720 ml  Output   1200 ml  Net   -480 ml   Filed Weights   10/07/15 0357 10/08/15 0430 10/09/15 0648  Weight: 51.2 kg (112 lb 14 oz) 51.755 kg (114 lb 1.6 oz) 51.529 kg (113 lb 9.6 oz)    General appearance: alert, cooperative, appears stated age and no distress Resp: Few crackles noted bilaterally at the bases. No wheezing. Cardio: regular rate and rhythm, S1, S2 normal, loud systolic murmur at apex, click, rub or gallop GI: soft, non-tender; bowel sounds normal; no masses,  no organomegaly Neurologic: alert and oriented 3. No focal neurological deficits are noted.  Lab Results:  Basic Metabolic Panel:  Recent Labs Lab 10/05/15 0424 10/06/15 0400 10/07/15 0616 10/08/15 0329 10/09/15 0511  NA 137 142 139 140 139  K 3.3* 3.9 4.2 4.0 4.1  CL 101 105 101 101 100*  CO2 25 26 28 30 29   GLUCOSE 82 104* 105* 100* 107*  BUN 21* 22* 23* 26* 24*  CREATININE 0.74 0.76 0.74 0.88 0.80  CALCIUM 9.1 9.6 9.7 9.5 9.7    CBC:  Recent Labs Lab  10/04/15 0528 10/05/15 0424  WBC 9.1 7.0  HGB 12.8 11.6*  HCT 38.2 34.3*  MCV 94.8 95.3  PLT 232 209   BNP (last 3 results)  Recent Labs  09/30/15 0335  BNP 460.6*    Recent Results (from the past 240 hour(s))  Blood culture (routine x 2)     Status: None   Collection Time: 09/30/15  6:53 AM  Result Value Ref Range Status   Specimen Description BLOOD RIGHT ANTECUBITAL  Final   Special Requests BOTTLES DRAWN AEROBIC AND ANAEROBIC 5CC  Final   Culture NO GROWTH 5 DAYS  Final   Report Status 10/05/2015 FINAL  Final  Blood culture (routine x 2)     Status: None   Collection Time: 09/30/15  6:55 AM  Result Value Ref Range Status   Specimen Description BLOOD RIGHT HAND  Final   Special Requests BOTTLES DRAWN AEROBIC ONLY 3CC  Final   Culture NO GROWTH 5 DAYS  Final   Report Status 10/05/2015 FINAL  Final  MRSA PCR Screening     Status: None  Collection Time: 09/30/15 10:57 AM  Result Value Ref Range Status   MRSA by PCR NEGATIVE NEGATIVE Final    Comment:        The GeneXpert MRSA Assay (FDA approved for NASAL specimens only), is one component of a comprehensive MRSA colonization surveillance program. It is not intended to diagnose MRSA infection nor to guide or monitor treatment for MRSA infections.   C difficile quick scan w PCR reflex     Status: None   Collection Time: 10/03/15  5:53 PM  Result Value Ref Range Status   C Diff antigen NEGATIVE NEGATIVE Final   C Diff toxin NEGATIVE NEGATIVE Final   C Diff interpretation Negative for toxigenic C. difficile  Final      Studies/Results: No results found.  Medications:  Scheduled: . aspirin EC  81 mg Oral Daily  . ezetimibe  10 mg Oral Daily  . furosemide  40 mg Intravenous Daily  . heparin subcutaneous  5,000 Units Subcutaneous 3 times per day  . levothyroxine  100 mcg Oral QAC breakfast  . multivitamin with minerals  1 tablet Oral Daily  . nebivolol  2.5 mg Oral Daily  . omega-3 acid ethyl esters  1 g  Oral Daily  . pantoprazole  40 mg Oral Daily  . rosuvastatin  2.5 mg Oral Daily  . sodium chloride  3 mL Intravenous Q12H  . vitamin E  400 Units Oral Daily   Continuous:   HFW:YOVZCH chloride, acetaminophen, loperamide, ondansetron **OR** ondansetron (ZOFRAN) IV, polyvinyl alcohol, sodium chloride, sodium chloride  Assessment/Plan:  Principal Problem:   Respiratory distress Active Problems:   Hypothyroidism   GERD   Hypertension, isolated systolic   Acute on chronic diastolic congestive heart failure, NYHA class 3 (HCC)   Paroxysmal atrial fibrillation (HCC)   Acute respiratory failure with hypoxia and hypercapnia (HCC)   Mitral regurgitation   Left hip pain   Acute congestive heart failure (HCC)   Severe mitral regurgitation   Shortness of breath    Acute Diastolic CHF secondary to Severe mitral regurgitation/Pulmonary HTN -Patient remains stable. Continue Lasix intravenously. She still gets quite dyspneic with minimal exertion.  -10/01/2015 echo--EF 65-70 percent, moderate MR, PAP 66 -Cardiology was consulted. TEE was subsequently performed. Severe mitral regurgitation was noted. Patient underwent cardiac catheterization 10/6. No significant coronary artery disease was noted. She has been seen by cardiothoracic surgery. Plan is for mitral valve repair during this hospitalization, on October 13.  -she continues to be in negative fluid balance. Continues to lose weight due to diuresis. -continue Bystolic - initially started on antibiotics, which have been discontinued.  Acute Respiratory Failure with hypoxia and hypercapnea -Resolved. She was weaned off of BiPAP after 48 hours. -this is thought to be secondary to CHF with underlying COPD -CT angiogram chest--negative for pulmonary embolus but revealed small bilateral pleural effusions with emphysema; interstitial and alveolar edema present  Questionable arrhythmia -Initially thought to be in atrial fibrillation, however,  cardiology does not think that this was the case. Anticoagulation has been discontinued for now. Will defer to cardiology regarding this matter. TSH 0.586  Elevated troponin -Secondary to demand ischemia in the setting of diastolic CHF -No symptoms of angina. Cardiac catheterization revealed nonobstructive coronary artery disease.  Hyperlipidemia -Continue Crestor  Hypothyroidism -Continue Synthroid -TSH 0.586  GERD -continue PPI  L-hip pain -xray left hip is unremarkable. Continue analgesic agents.  DVT Prophylaxis: subcutaneous heparin    Code Status: full code  Family Communication: discussed with patient  Disposition Plan: await mitral valve repair on 10/11/15    LOS: 9 days   [facility]  [facility] Pager [phone] 10/09/2015, 8:11 AM  If 7PM-7AM, please contact night-coverage at [email or web address], password [facility]

## 2015-10-09 NOTE — Progress Notes (Signed)
5 Days Post-Op Procedure(s) (LRB): Right/Left Heart Cath and Coronary Angiography (N/A) Subjective: No complaints Anxious about surgery  Objective: Vital signs in last 24 hours: Temp:  [97.6 F (36.4 C)-98.9 F (37.2 C)] 97.7 F (36.5 C) (10/11 1500) Pulse Rate:  [60-76] 73 (10/11 1500) Cardiac Rhythm:  [-] Normal sinus rhythm (10/11 0712) Resp:  [18-19] 18 (10/11 1500) BP: (95-109)/(43-53) 95/43 mmHg (10/11 1500) SpO2:  [96 %-99 %] 97 % (10/11 1500) Weight:  [113 lb 9.6 oz (51.529 kg)] 113 lb 9.6 oz (51.529 kg) (10/11 5701)  Hemodynamic parameters for last 24 hours:    Intake/Output from previous day: 10/10 0701 - 10/11 0700 In: 720 [P.O.:720] Out: 1200 [Urine:1200] Intake/Output this shift: Total I/O In: 480 [P.O.:480] Out: 900 [Urine:900]  General appearance: alert, cooperative and no distress Neurologic: intact Heart: regular rate and rhythm  Lab Results: No results for input(s): WBC, HGB, HCT, PLT in the last 72 hours. BMET:  Recent Labs  10/08/15 0329 10/09/15 0511  NA 140 139  K 4.0 4.1  CL 101 100*  CO2 30 29  GLUCOSE 100* 107*  BUN 26* 24*  CREATININE 0.88 0.80  CALCIUM 9.5 9.7    PT/INR: No results for input(s): LABPROT, INR in the last 72 hours. ABG    Component Value Date/Time   PHART 7.299* 10/04/2015 1414   HCO3 22.3 10/04/2015 1414   TCO2 24 10/04/2015 1414   ACIDBASEDEF 4.0* 10/04/2015 1414   O2SAT 96.0 10/04/2015 1414   CBG (last 3)  No results for input(s): GLUCAP in the last 72 hours.  Assessment/Plan: S/P Procedure(s) (LRB): Right/Left Heart Cath and Coronary Angiography (N/A) -  For mitral repair in AM All questions answered   LOS: 9 days    Amy Fischer 10/09/2015

## 2015-10-09 NOTE — Progress Notes (Signed)
PT Cancellation Note  Patient Details Name: Amy Fischer MRN: [id number] DOB: 1929/02/15   Cancelled Treatment:    Reason Eval/Treat Not Completed: Other (comment) (pt currently working with cardiac rehab. Will follow post op as appropriate)   Lanetta Inch Beth 10/09/2015, 11:43 AM Elwyn Reach, [place]

## 2015-10-09 NOTE — Progress Notes (Signed)
CARDIAC REHAB PHASE I   PRE:  Rate/Rhythm: 74 SR  BP:  Supine:   Sitting: 118/44  Standing:    SaO2: 94%RA  MODE:  Ambulation: 460 ft   POST:  Rate/Rhythm: 82 SR  BP:  Supine:   Sitting: 135/38  Standing:    SaO2: 88-91% RA 1135-1215 Pt walked 460 ft on RA with rolling walker with steady gait. Stopped once to rest. Tolerated well. Sats a little low but recovered quickly. Re enforced purpose of IS and walks after surgery.   Graylon Good, RN BSN  10/09/2015 12:10 PM   374 SR

## 2015-10-09 NOTE — Progress Notes (Signed)
Subjective:  For MV repair tomorrow, no complaints other than DOE  Objective:  Temp:  [97.6 F (36.4 C)-99.7 F (37.6 C)] 97.6 F (36.4 C) (10/11 0938) Pulse Rate:  [60-77] 73 (10/11 0938) Resp:  [18-19] 18 (10/11 0938) BP: (100-109)/(43-53) 100/43 mmHg (10/11 0938) SpO2:  [96 %-99 %] 96 % (10/11 0938) Weight:  [113 lb 9.6 oz (51.529 kg)] 113 lb 9.6 oz (51.529 kg) (10/11 0648) Weight change: -8 oz (-0.227 kg)  Intake/Output from previous day: 10/10 0701 - 10/11 0700 In: 720 [P.O.:720] Out: 1200 [Urine:1200]  Intake/Output from this shift:    Physical Exam: General appearance: alert and no distress Neck: no adenopathy, no carotid bruit, no JVD, supple, symmetrical, trachea midline and thyroid not enlarged, symmetric, no tenderness/mass/nodules Lungs: clear to auscultation bilaterally Heart: 2/6 apical SEM c/w MR Extremities: extremities normal, atraumatic, no cyanosis or edema  Lab Results: Results for orders placed or performed during the hospital encounter of 09/30/15 (from the past 48 hour(s))  Basic metabolic panel     Status: Abnormal   Collection Time: 10/08/15  3:29 AM  Result Value Ref Range   Sodium 140 135 - 145 mmol/L   Potassium 4.0 3.5 - 5.1 mmol/L   Chloride 101 101 - 111 mmol/L   CO2 30 22 - 32 mmol/L   Glucose, Bld 100 (H) 65 - 99 mg/dL   BUN 26 (H) 6 - 20 mg/dL   Creatinine, Ser 0.88 0.44 - 1.00 mg/dL   Calcium 9.5 8.9 - 10.3 mg/dL   GFR calc non Af Amer 59 (L) >60 mL/min   GFR calc Af Amer >60 >60 mL/min    Comment: (NOTE) The eGFR has been calculated using the CKD EPI equation. This calculation has not been validated in all clinical situations. eGFR's persistently <60 mL/min signify possible Chronic Kidney Disease.    Anion gap 9 5 - 15  Basic metabolic panel     Status: Abnormal   Collection Time: 10/09/15  5:11 AM  Result Value Ref Range   Sodium 139 135 - 145 mmol/L   Potassium 4.1 3.5 - 5.1 mmol/L   Chloride 100 (L) 101 - 111  mmol/L   CO2 29 22 - 32 mmol/L   Glucose, Bld 107 (H) 65 - 99 mg/dL   BUN 24 (H) 6 - 20 mg/dL   Creatinine, Ser 0.80 0.44 - 1.00 mg/dL   Calcium 9.7 8.9 - 10.3 mg/dL   GFR calc non Af Amer >60 >60 mL/min   GFR calc Af Amer >60 >60 mL/min    Comment: (NOTE) The eGFR has been calculated using the CKD EPI equation. This calculation has not been validated in all clinical situations. eGFR's persistently <60 mL/min signify possible Chronic Kidney Disease.    Anion gap 10 5 - 15    Imaging: Imaging results have been reviewed  Tele: NSR  Assessment/Plan:   1. Principal Problem: 2.   Respiratory distress 3. Active Problems: 4.   Hypothyroidism 5.   GERD 6.   Hypertension, isolated systolic 7.   Acute on chronic diastolic congestive heart failure, NYHA class 3 ([place]) 8.   Paroxysmal atrial fibrillation (HCC) 9.   Acute respiratory failure with hypoxia and hypercapnia (HCC) 10.   Mitral regurgitation 11.   Left hip pain 12.   Acute congestive heart failure ([place]) 13.   Severe mitral regurgitation 14.   Shortness of breath 15.   Time Spent Directly with Patient:  15 minutes  Length of Stay:  LOS: 9 days   Clinically stable. Exam benign. For MV repair tomorrow.  Quay Burow 10/09/2015, 12:01 PM

## 2015-10-10 ENCOUNTER — Inpatient Hospital Stay (HOSPITAL_COMMUNITY): Payer: Medicare Other | Admitting: Anesthesiology

## 2015-10-10 ENCOUNTER — Encounter (HOSPITAL_COMMUNITY)
Admission: EM | Disposition: A | Discharge status: Home Health | Payer: Medicare Other | Source: Home / Self Care | Attending: Thoracic Surgery (Cardiothoracic Vascular Surgery)

## 2015-10-10 ENCOUNTER — Inpatient Hospital Stay (HOSPITAL_COMMUNITY): Payer: Medicare Other

## 2015-10-10 DIAGNOSIS — Z9889 Other specified postprocedural states: Secondary | ICD-10-CM

## 2015-10-10 DIAGNOSIS — I340 Nonrheumatic mitral (valve) insufficiency: Secondary | ICD-10-CM

## 2015-10-10 HISTORY — PX: TEE WITHOUT CARDIOVERSION: SHX5443

## 2015-10-10 HISTORY — PX: MITRAL VALVE REPAIR: SHX2039

## 2015-10-10 LAB — CBC
HCT: 35.3 % — ABNORMAL LOW (ref 36.0–46.0)
HCT: 34.7 % — ABNORMAL LOW (ref 36.0–46.0)
HEMATOCRIT: 34.2 % — AB (ref 36.0–46.0)
HEMOGLOBIN: 11.6 g/dL — AB (ref 12.0–15.0)
Hemoglobin: 11.3 g/dL — ABNORMAL LOW (ref 12.0–15.0)
Hemoglobin: 12 g/dL (ref 12.0–15.0)
MCH: 31.7 pg (ref 26.0–34.0)
MCH: 31.7 pg (ref 26.0–34.0)
MCH: 31.1 pg (ref 26.0–34.0)
MCHC: 33 g/dL (ref 30.0–36.0)
MCHC: 34 g/dL (ref 30.0–36.0)
MCHC: 33.4 g/dL (ref 30.0–36.0)
MCV: 96.1 fL (ref 78.0–100.0)
MCV: 93.1 fL (ref 78.0–100.0)
MCV: 93 fL (ref 78.0–100.0)
PLATELETS: 99 10*3/uL — AB (ref 150–400)
PLATELETS: 110 10*3/uL — AB (ref 150–400)
Platelets: 276 10*3/uL (ref 150–400)
RBC: 3.56 MIL/uL — ABNORMAL LOW (ref 3.87–5.11)
RBC: 3.79 MIL/uL — ABNORMAL LOW (ref 3.87–5.11)
RBC: 3.73 MIL/uL — ABNORMAL LOW (ref 3.87–5.11)
RDW: 13.9 % (ref 11.5–15.5)
RDW: 14.9 % (ref 11.5–15.5)
RDW: 15.3 % (ref 11.5–15.5)
WBC: 11.2 10*3/uL — ABNORMAL HIGH (ref 4.0–10.5)
WBC: 16.8 10*3/uL — ABNORMAL HIGH (ref 4.0–10.5)
WBC: 15.3 10*3/uL — ABNORMAL HIGH (ref 4.0–10.5)

## 2015-10-10 LAB — GLUCOSE, CAPILLARY
GLUCOSE-CAPILLARY: 80 mg/dL (ref 65–99)
GLUCOSE-CAPILLARY: 90 mg/dL (ref 65–99)
GLUCOSE-CAPILLARY: 104 mg/dL — AB (ref 65–99)
GLUCOSE-CAPILLARY: 141 mg/dL — AB (ref 65–99)
GLUCOSE-CAPILLARY: 86 mg/dL (ref 65–99)
Glucose-Capillary: 115 mg/dL — ABNORMAL HIGH (ref 65–99)
Glucose-Capillary: 117 mg/dL — ABNORMAL HIGH (ref 65–99)
Glucose-Capillary: 121 mg/dL — ABNORMAL HIGH (ref 65–99)
Glucose-Capillary: 86 mg/dL (ref 65–99)
Glucose-Capillary: 97 mg/dL (ref 65–99)

## 2015-10-10 LAB — URINALYSIS, ROUTINE W REFLEX MICROSCOPIC
Bilirubin Urine: NEGATIVE
Glucose, UA: NEGATIVE mg/dL
Hgb urine dipstick: NEGATIVE
KETONES UR: NEGATIVE mg/dL
NITRITE: NEGATIVE
PROTEIN: NEGATIVE mg/dL
Specific Gravity, Urine: 1.026 (ref 1.005–1.030)
UROBILINOGEN UA: 0.2 mg/dL (ref 0.0–1.0)
pH: 6 (ref 5.0–8.0)

## 2015-10-10 LAB — POCT I-STAT, CHEM 8
BUN: 21 mg/dL — ABNORMAL HIGH (ref 6–20)
BUN: 19 mg/dL (ref 6–20)
BUN: 20 mg/dL (ref 6–20)
BUN: 20 mg/dL (ref 6–20)
BUN: 19 mg/dL (ref 6–20)
BUN: 17 mg/dL (ref 6–20)
CALCIUM ION: 1.29 mmol/L (ref 1.13–1.30)
CALCIUM ION: 1.03 mmol/L — AB (ref 1.13–1.30)
CALCIUM ION: 1.14 mmol/L (ref 1.13–1.30)
CHLORIDE: 103 mmol/L (ref 101–111)
CHLORIDE: 105 mmol/L (ref 101–111)
CREATININE: 0.7 mg/dL (ref 0.44–1.00)
CREATININE: 0.6 mg/dL (ref 0.44–1.00)
CREATININE: 0.6 mg/dL (ref 0.44–1.00)
Calcium, Ion: 1.29 mmol/L (ref 1.13–1.30)
Calcium, Ion: 1 mmol/L — ABNORMAL LOW (ref 1.13–1.30)
Calcium, Ion: 1.07 mmol/L — ABNORMAL LOW (ref 1.13–1.30)
Chloride: 100 mmol/L — ABNORMAL LOW (ref 101–111)
Chloride: 94 mmol/L — ABNORMAL LOW (ref 101–111)
Chloride: 99 mmol/L — ABNORMAL LOW (ref 101–111)
Chloride: 105 mmol/L (ref 101–111)
Creatinine, Ser: 0.6 mg/dL (ref 0.44–1.00)
Creatinine, Ser: 0.7 mg/dL (ref 0.44–1.00)
Creatinine, Ser: 0.7 mg/dL (ref 0.44–1.00)
GLUCOSE: 137 mg/dL — AB (ref 65–99)
GLUCOSE: 137 mg/dL — AB (ref 65–99)
GLUCOSE: 163 mg/dL — AB (ref 65–99)
GLUCOSE: 123 mg/dL — AB (ref 65–99)
Glucose, Bld: 110 mg/dL — ABNORMAL HIGH (ref 65–99)
Glucose, Bld: 185 mg/dL — ABNORMAL HIGH (ref 65–99)
HCT: 20 % — ABNORMAL LOW (ref 36.0–46.0)
HCT: 29 % — ABNORMAL LOW (ref 36.0–46.0)
HCT: 21 % — ABNORMAL LOW (ref 36.0–46.0)
HCT: 21 % — ABNORMAL LOW (ref 36.0–46.0)
HEMATOCRIT: 30 % — AB (ref 36.0–46.0)
HEMATOCRIT: 34 % — AB (ref 36.0–46.0)
HEMOGLOBIN: 10.2 g/dL — AB (ref 12.0–15.0)
HEMOGLOBIN: 9.9 g/dL — AB (ref 12.0–15.0)
HEMOGLOBIN: 11.6 g/dL — AB (ref 12.0–15.0)
Hemoglobin: 6.8 g/dL — CL (ref 12.0–15.0)
Hemoglobin: 7.1 g/dL — ABNORMAL LOW (ref 12.0–15.0)
Hemoglobin: 7.1 g/dL — ABNORMAL LOW (ref 12.0–15.0)
POTASSIUM: 6 mmol/L — AB (ref 3.5–5.1)
POTASSIUM: 4.3 mmol/L (ref 3.5–5.1)
Potassium: 3.5 mmol/L (ref 3.5–5.1)
Potassium: 3.4 mmol/L — ABNORMAL LOW (ref 3.5–5.1)
Potassium: 3.5 mmol/L (ref 3.5–5.1)
Potassium: 4.5 mmol/L (ref 3.5–5.1)
SODIUM: 133 mmol/L — AB (ref 135–145)
SODIUM: 140 mmol/L (ref 135–145)
Sodium: 138 mmol/L (ref 135–145)
Sodium: 136 mmol/L (ref 135–145)
Sodium: 135 mmol/L (ref 135–145)
Sodium: 138 mmol/L (ref 135–145)
TCO2: 27 mmol/L (ref 0–100)
TCO2: 26 mmol/L (ref 0–100)
TCO2: 29 mmol/L (ref 0–100)
TCO2: 21 mmol/L (ref 0–100)
TCO2: 24 mmol/L (ref 0–100)
TCO2: 22 mmol/L (ref 0–100)

## 2015-10-10 LAB — POCT I-STAT GLUCOSE
Glucose, Bld: 152 mg/dL — ABNORMAL HIGH (ref 65–99)
Operator id: 3406

## 2015-10-10 LAB — POCT I-STAT 3, ART BLOOD GAS (G3+)
ACID-BASE DEFICIT: 3 mmol/L — AB (ref 0.0–2.0)
Acid-Base Excess: 3 mmol/L — ABNORMAL HIGH (ref 0.0–2.0)
Acid-base deficit: 5 mmol/L — ABNORMAL HIGH (ref 0.0–2.0)
BICARBONATE: 22.4 meq/L (ref 20.0–24.0)
Bicarbonate: 27.9 mEq/L — ABNORMAL HIGH (ref 20.0–24.0)
Bicarbonate: 21.5 mEq/L (ref 20.0–24.0)
Bicarbonate: 25.7 mEq/L — ABNORMAL HIGH (ref 20.0–24.0)
O2 SAT: 91 %
O2 SAT: 92 %
O2 SAT: 89 %
O2 Saturation: 100 %
PCO2 ART: 41.9 mmHg (ref 35.0–45.0)
PCO2 ART: 41.9 mmHg (ref 35.0–45.0)
PH ART: 7.431 (ref 7.350–7.450)
PH ART: 7.316 — AB (ref 7.350–7.450)
PH ART: 7.32 — AB (ref 7.350–7.450)
PO2 ART: 64 mmHg — AB (ref 80.0–100.0)
Patient temperature: 36.5
TCO2: 29 mmol/L (ref 0–100)
TCO2: 23 mmol/L (ref 0–100)
TCO2: 24 mmol/L (ref 0–100)
TCO2: 27 mmol/L (ref 0–100)
pCO2 arterial: 43.9 mmHg (ref 35.0–45.0)
pCO2 arterial: 44.9 mmHg (ref 35.0–45.0)
pH, Arterial: 7.37 (ref 7.350–7.450)
pO2, Arterial: 315 mmHg — ABNORMAL HIGH (ref 80.0–100.0)
pO2, Arterial: 70 mmHg — ABNORMAL LOW (ref 80.0–100.0)
pO2, Arterial: 62 mmHg — ABNORMAL LOW (ref 80.0–100.0)

## 2015-10-10 LAB — PROTIME-INR
INR: 1.67 — ABNORMAL HIGH (ref 0.00–1.49)
Prothrombin Time: 19.7 seconds — ABNORMAL HIGH (ref 11.6–15.2)

## 2015-10-10 LAB — APTT
APTT: 39 s — AB (ref 24–37)
aPTT: 40 seconds — ABNORMAL HIGH (ref 24–37)

## 2015-10-10 LAB — POCT I-STAT 4, (NA,K, GLUC, HGB,HCT)
GLUCOSE: 83 mg/dL (ref 65–99)
HEMATOCRIT: 34 % — AB (ref 36.0–46.0)
HEMOGLOBIN: 11.6 g/dL — AB (ref 12.0–15.0)
Potassium: 4 mmol/L (ref 3.5–5.1)
SODIUM: 141 mmol/L (ref 135–145)

## 2015-10-10 LAB — BASIC METABOLIC PANEL
ANION GAP: 13 (ref 5–15)
BUN: 24 mg/dL — ABNORMAL HIGH (ref 6–20)
CALCIUM: 9.6 mg/dL (ref 8.9–10.3)
CO2: 27 mmol/L (ref 22–32)
CREATININE: 0.82 mg/dL (ref 0.44–1.00)
Chloride: 98 mmol/L — ABNORMAL LOW (ref 101–111)
Glucose, Bld: 94 mg/dL (ref 65–99)
Potassium: 3.9 mmol/L (ref 3.5–5.1)
SODIUM: 138 mmol/L (ref 135–145)

## 2015-10-10 LAB — PREPARE RBC (CROSSMATCH)

## 2015-10-10 LAB — URINE MICROSCOPIC-ADD ON

## 2015-10-10 LAB — CREATININE, SERUM
CREATININE: 0.69 mg/dL (ref 0.44–1.00)
GFR calc Af Amer: >60 mL/min (ref >60)
GFR calc non Af Amer: >60 mL/min (ref >60)

## 2015-10-10 LAB — SURGICAL PCR SCREEN
MRSA, PCR: NEGATIVE
STAPHYLOCOCCUS AUREUS: POSITIVE — AB

## 2015-10-10 LAB — PLATELET COUNT: Platelets: 126 10*3/uL — ABNORMAL LOW (ref 150–400)

## 2015-10-10 LAB — HEMOGLOBIN AND HEMATOCRIT, BLOOD
HCT: 22.2 % — ABNORMAL LOW (ref 36.0–46.0)
Hemoglobin: 7.4 g/dL — ABNORMAL LOW (ref 12.0–15.0)

## 2015-10-10 LAB — MAGNESIUM: MAGNESIUM: 3 mg/dL — AB (ref 1.7–2.4)

## 2015-10-10 SURGERY — REPAIR, MITRAL VALVE
Anesthesia: General | Site: Chest

## 2015-10-10 MED ORDER — PROTAMINE SULFATE 10 MG/ML IV SOLN
INTRAVENOUS | Status: AC
Start: 2015-10-10 — End: 2015-10-10
  Filled 2015-10-10: qty 25

## 2015-10-10 MED ORDER — SODIUM CHLORIDE 0.9 % IJ SOLN
INTRAMUSCULAR | Status: AC
  Filled 2015-10-10: qty 10

## 2015-10-10 MED ORDER — FAMOTIDINE IN NACL 20-0.9 MG/50ML-% IV SOLN
20.0000 mg | Freq: Two times a day (BID) | INTRAVENOUS | Status: DC
  Administered 2015-10-10: 20 mg via INTRAVENOUS

## 2015-10-10 MED ORDER — PHENYLEPHRINE HCL 10 MG/ML IJ SOLN
0.0000 ug/min | INTRAVENOUS | Status: DC
  Administered 2015-10-11 (×3): 30 ug/min via INTRAVENOUS
  Administered 2015-10-12: 40 ug/min via INTRAVENOUS
  Filled 2015-10-10 (×6): qty 2

## 2015-10-10 MED ORDER — MUPIROCIN 2 % EX OINT
1.0000 "application " | TOPICAL_OINTMENT | Freq: Two times a day (BID) | CUTANEOUS | Status: AC
  Administered 2015-10-10 – 2015-10-15 (×9): 1 via NASAL
  Filled 2015-10-10 (×2): qty 22

## 2015-10-10 MED ORDER — FENTANYL CITRATE (PF) 100 MCG/2ML IJ SOLN
50.0000 ug | INTRAMUSCULAR | Status: DC | PRN
  Administered 2015-10-10 – 2015-10-11 (×4): 50 ug via INTRAVENOUS
  Filled 2015-10-10 (×5): qty 2

## 2015-10-10 MED ORDER — LACTATED RINGERS IV SOLN: INTRAVENOUS | Status: DC

## 2015-10-10 MED ORDER — FENTANYL CITRATE (PF) 250 MCG/5ML IJ SOLN
INTRAMUSCULAR | Status: AC
  Filled 2015-10-10: qty 5

## 2015-10-10 MED ORDER — BISACODYL 10 MG RE SUPP
10.0000 mg | Freq: Every day | RECTAL | Status: DC
Start: 2015-10-11 — End: 2015-10-12

## 2015-10-10 MED ORDER — LACTATED RINGERS IV SOLN
INTRAVENOUS | Status: DC | PRN
  Administered 2015-10-10: 08:00:00 via INTRAVENOUS

## 2015-10-10 MED ORDER — MAGNESIUM SULFATE 4 GM/100ML IV SOLN
4.0000 g | Freq: Once | INTRAVENOUS | Status: AC
  Administered 2015-10-10: 4 g via INTRAVENOUS
  Filled 2015-10-10: qty 100

## 2015-10-10 MED ORDER — 0.9 % SODIUM CHLORIDE (POUR BTL) OPTIME
TOPICAL | Status: DC | PRN
  Administered 2015-10-10: 1000 mL
  Administered 2015-10-10: 4000 mL

## 2015-10-10 MED ORDER — SODIUM CHLORIDE 0.9 % IJ SOLN
INTRAMUSCULAR | Status: DC | PRN
  Administered 2015-10-10: 12 mL via TOPICAL

## 2015-10-10 MED ORDER — DOCUSATE SODIUM 100 MG PO CAPS: 200.0000 mg | ORAL_CAPSULE | Freq: Every day | ORAL | Status: DC

## 2015-10-10 MED ORDER — ANTISEPTIC ORAL RINSE SOLUTION (CORINZ): 7.0000 mL | Freq: Four times a day (QID) | OROMUCOSAL | Status: DC

## 2015-10-10 MED ORDER — SODIUM CHLORIDE 0.45 % IV SOLN
INTRAVENOUS | Status: DC | PRN
  Administered 2015-10-10: 20 mL/h via INTRAVENOUS

## 2015-10-10 MED ORDER — HEPARIN SODIUM (PORCINE) 1000 UNIT/ML IJ SOLN
INTRAMUSCULAR | Status: DC | PRN
  Administered 2015-10-10: 18000 [IU] via INTRAVENOUS

## 2015-10-10 MED ORDER — MIDAZOLAM HCL 10 MG/2ML IJ SOLN
INTRAMUSCULAR | Status: AC
  Filled 2015-10-10: qty 4

## 2015-10-10 MED ORDER — LACTATED RINGERS IV SOLN
INTRAVENOUS | Status: DC
Start: 2015-10-10 — End: 2015-10-10

## 2015-10-10 MED ORDER — MORPHINE SULFATE (PF) 2 MG/ML IV SOLN
1.0000 mg | INTRAVENOUS | Status: AC | PRN
  Administered 2015-10-10: 2 mg via INTRAVENOUS
  Filled 2015-10-10: qty 1

## 2015-10-10 MED ORDER — ACETAMINOPHEN 160 MG/5ML PO SOLN
1000.0000 mg | Freq: Four times a day (QID) | ORAL | Status: AC
Start: 2015-10-11 — End: 2015-10-15

## 2015-10-10 MED ORDER — ASPIRIN EC 325 MG PO TBEC
325.0000 mg | DELAYED_RELEASE_TABLET | Freq: Every day | ORAL | Status: DC
  Filled 2015-10-10 (×2): qty 1

## 2015-10-10 MED ORDER — PROTAMINE SULFATE 10 MG/ML IV SOLN
INTRAVENOUS | Status: DC | PRN
  Administered 2015-10-10: 140 mg via INTRAVENOUS
  Administered 2015-10-10: 10 mg via INTRAVENOUS

## 2015-10-10 MED ORDER — CETYLPYRIDINIUM CHLORIDE 0.05 % MT LIQD
7.0000 mL | Freq: Two times a day (BID) | OROMUCOSAL | Status: DC
  Administered 2015-10-10 – 2015-10-15 (×4): 7 mL via OROMUCOSAL

## 2015-10-10 MED ORDER — LIDOCAINE HCL (CARDIAC) 20 MG/ML IV SOLN
INTRAVENOUS | Status: AC
  Filled 2015-10-10: qty 5

## 2015-10-10 MED ORDER — PROPOFOL 10 MG/ML IV BOLUS
INTRAVENOUS | Status: AC
  Filled 2015-10-10: qty 20

## 2015-10-10 MED ORDER — HEMOSTATIC AGENTS (NO CHARGE) OPTIME
TOPICAL | Status: DC | PRN
  Administered 2015-10-10: 1 via TOPICAL

## 2015-10-10 MED ORDER — LIDOCAINE HCL (CARDIAC) 20 MG/ML IV SOLN
INTRAVENOUS | Status: DC | PRN
  Administered 2015-10-10: 60 mg via INTRAVENOUS

## 2015-10-10 MED ORDER — INSULIN REGULAR HUMAN 100 UNIT/ML IJ SOLN
INTRAMUSCULAR | Status: DC
  Filled 2015-10-10 (×2): qty 2.5

## 2015-10-10 MED ORDER — ASPIRIN 81 MG PO CHEW: 324.0000 mg | CHEWABLE_TABLET | Freq: Every day | ORAL | Status: DC

## 2015-10-10 MED ORDER — SODIUM CHLORIDE 0.9 % IV SOLN
250.0000 mL | INTRAVENOUS | Status: DC
Start: 2015-10-11 — End: 2015-10-12

## 2015-10-10 MED ORDER — TRAMADOL HCL 50 MG PO TABS
50.0000 mg | ORAL_TABLET | ORAL | Status: DC | PRN
  Administered 2015-10-10 – 2015-10-12 (×3): 100 mg via ORAL
  Administered 2015-10-18: 50 mg via ORAL
  Administered 2015-10-18: 100 mg via ORAL
  Filled 2015-10-10: qty 1
  Filled 2015-10-10 (×4): qty 2

## 2015-10-10 MED ORDER — METOPROLOL TARTRATE 1 MG/ML IV SOLN: 2.5000 mg | INTRAVENOUS | Status: DC | PRN

## 2015-10-10 MED ORDER — PROPOFOL 10 MG/ML IV BOLUS
INTRAVENOUS | Status: DC | PRN
  Administered 2015-10-10: 20 mg via INTRAVENOUS

## 2015-10-10 MED ORDER — ARTIFICIAL TEARS OP OINT
TOPICAL_OINTMENT | OPHTHALMIC | Status: DC | PRN
  Administered 2015-10-10: 1 via OPHTHALMIC

## 2015-10-10 MED ORDER — FENTANYL CITRATE (PF) 100 MCG/2ML IJ SOLN
INTRAMUSCULAR | Status: DC | PRN
  Administered 2015-10-10: 250 ug via INTRAVENOUS
  Administered 2015-10-10: 150 ug via INTRAVENOUS
  Administered 2015-10-10: 25 ug via INTRAVENOUS
  Administered 2015-10-10: 350 ug via INTRAVENOUS
  Administered 2015-10-10: 225 ug via INTRAVENOUS
  Administered 2015-10-10: 250 ug via INTRAVENOUS

## 2015-10-10 MED ORDER — METOPROLOL TARTRATE 25 MG/10 ML ORAL SUSPENSION
12.5000 mg | Freq: Two times a day (BID) | ORAL | Status: DC
  Filled 2015-10-10 (×13): qty 5

## 2015-10-10 MED ORDER — OXYCODONE HCL 5 MG PO TABS: 5.0000 mg | ORAL_TABLET | ORAL | Status: DC | PRN

## 2015-10-10 MED ORDER — DOPAMINE-DEXTROSE 3.2-5 MG/ML-% IV SOLN
0.0000 ug/kg/min | INTRAVENOUS | Status: DC
  Administered 2015-10-10: 5 ug/kg/min via INTRAVENOUS

## 2015-10-10 MED ORDER — ACETAMINOPHEN 500 MG PO TABS
1000.0000 mg | ORAL_TABLET | Freq: Four times a day (QID) | ORAL | Status: AC
Start: 2015-10-11 — End: 2015-10-15
  Administered 2015-10-10 – 2015-10-15 (×8): 1000 mg via ORAL
  Filled 2015-10-10 (×22): qty 2

## 2015-10-10 MED ORDER — SODIUM BICARBONATE 8.4 % IV SOLN
50.0000 meq | Freq: Once | INTRAVENOUS | Status: AC
  Administered 2015-10-10: 50 meq via INTRAVENOUS

## 2015-10-10 MED ORDER — SODIUM CHLORIDE 0.9 % IJ SOLN: 3.0000 mL | INTRAMUSCULAR | Status: DC | PRN

## 2015-10-10 MED ORDER — SUCCINYLCHOLINE CHLORIDE 20 MG/ML IJ SOLN
INTRAMUSCULAR | Status: AC
  Filled 2015-10-10: qty 1

## 2015-10-10 MED ORDER — SODIUM CHLORIDE 0.9 % IJ SOLN
3.0000 mL | Freq: Two times a day (BID) | INTRAMUSCULAR | Status: DC
Start: 2015-10-11 — End: 2015-10-16
  Administered 2015-10-11 – 2015-10-16 (×9): 3 mL via INTRAVENOUS

## 2015-10-10 MED ORDER — LEVOFLOXACIN IN D5W 750 MG/150ML IV SOLN
750.0000 mg | INTRAVENOUS | Status: AC
  Administered 2015-10-11: 750 mg via INTRAVENOUS
  Filled 2015-10-10: qty 150

## 2015-10-10 MED ORDER — FENTANYL CITRATE (PF) 250 MCG/5ML IJ SOLN
INTRAMUSCULAR | Status: AC
  Filled 2015-10-10: qty 30

## 2015-10-10 MED ORDER — CALCIUM CHLORIDE 10 % IV SOLN
INTRAVENOUS | Status: DC | PRN
  Administered 2015-10-10: 200 mg via INTRAVENOUS

## 2015-10-10 MED ORDER — CHLORHEXIDINE GLUCONATE 0.12% ORAL RINSE (MEDLINE KIT): 15.0000 mL | Freq: Two times a day (BID) | OROMUCOSAL | Status: DC

## 2015-10-10 MED ORDER — METOPROLOL TARTRATE 12.5 MG HALF TABLET
12.5000 mg | ORAL_TABLET | Freq: Two times a day (BID) | ORAL | Status: DC
  Administered 2015-10-14 – 2015-10-17 (×7): 12.5 mg via ORAL
  Filled 2015-10-10 (×17): qty 1

## 2015-10-10 MED ORDER — ARTIFICIAL TEARS OP OINT
TOPICAL_OINTMENT | OPHTHALMIC | Status: AC
  Filled 2015-10-10: qty 3.5

## 2015-10-10 MED ORDER — NITROGLYCERIN IN D5W 200-5 MCG/ML-% IV SOLN: 0.0000 ug/min | INTRAVENOUS | Status: DC

## 2015-10-10 MED ORDER — MIDAZOLAM HCL 5 MG/5ML IJ SOLN
INTRAMUSCULAR | Status: DC | PRN
  Administered 2015-10-10 (×2): 5 mg via INTRAVENOUS

## 2015-10-10 MED ORDER — ACETAMINOPHEN 650 MG RE SUPP
650.0000 mg | Freq: Once | RECTAL | Status: AC
  Administered 2015-10-10: 650 mg via RECTAL

## 2015-10-10 MED ORDER — PANTOPRAZOLE SODIUM 40 MG PO TBEC
40.0000 mg | DELAYED_RELEASE_TABLET | Freq: Every day | ORAL | Status: DC
  Administered 2015-10-12 – 2015-10-22 (×11): 40 mg via ORAL
  Filled 2015-10-10 (×10): qty 1

## 2015-10-10 MED ORDER — ACETAMINOPHEN 160 MG/5ML PO SOLN: 650.0000 mg | Freq: Once | ORAL | Status: AC

## 2015-10-10 MED ORDER — POTASSIUM CHLORIDE 10 MEQ/50ML IV SOLN: 10.0000 meq | INTRAVENOUS | Status: AC

## 2015-10-10 MED ORDER — EPHEDRINE SULFATE 50 MG/ML IJ SOLN
INTRAMUSCULAR | Status: AC
  Filled 2015-10-10: qty 1

## 2015-10-10 MED ORDER — ALBUMIN HUMAN 5 % IV SOLN
250.0000 mL | INTRAVENOUS | Status: AC | PRN
Start: 2015-10-10 — End: 2015-10-11
  Administered 2015-10-10 (×3): 250 mL via INTRAVENOUS
  Filled 2015-10-10: qty 250

## 2015-10-10 MED ORDER — STERILE WATER FOR IRRIGATION IR SOLN
Status: DC | PRN
  Administered 2015-10-10: 1000 mL

## 2015-10-10 MED ORDER — LACTATED RINGERS IV SOLN: 500.0000 mL | Freq: Once | INTRAVENOUS | Status: DC | PRN

## 2015-10-10 MED ORDER — SODIUM CHLORIDE 0.9 % IV SOLN
INTRAVENOUS | Status: DC
Start: 2015-10-10 — End: 2015-10-13
  Administered 2015-10-10: 20 mL/h via INTRAVENOUS

## 2015-10-10 MED ORDER — MIDAZOLAM HCL 2 MG/2ML IJ SOLN: 2.0000 mg | INTRAMUSCULAR | Status: DC | PRN

## 2015-10-10 MED ORDER — VANCOMYCIN HCL IN DEXTROSE 1-5 GM/200ML-% IV SOLN
1000.0000 mg | Freq: Once | INTRAVENOUS | Status: AC
  Administered 2015-10-10: 1000 mg via INTRAVENOUS
  Filled 2015-10-10: qty 200

## 2015-10-10 MED ORDER — ROCURONIUM BROMIDE 50 MG/5ML IV SOLN
INTRAVENOUS | Status: AC
  Filled 2015-10-10: qty 2

## 2015-10-10 MED ORDER — ONDANSETRON HCL 4 MG/2ML IJ SOLN
4.0000 mg | Freq: Four times a day (QID) | INTRAMUSCULAR | Status: DC | PRN
  Administered 2015-10-10 – 2015-10-18 (×10): 4 mg via INTRAVENOUS
  Filled 2015-10-10 (×10): qty 2

## 2015-10-10 MED ORDER — ROCURONIUM BROMIDE 100 MG/10ML IV SOLN
INTRAVENOUS | Status: DC | PRN
  Administered 2015-10-10 (×2): 20 mg via INTRAVENOUS
  Administered 2015-10-10: 50 mg via INTRAVENOUS

## 2015-10-10 MED ORDER — ALBUMIN HUMAN 5 % IV SOLN
INTRAVENOUS | Status: DC | PRN
  Administered 2015-10-10: 12:00:00 via INTRAVENOUS

## 2015-10-10 MED ORDER — CHLORHEXIDINE GLUCONATE CLOTH 2 % EX PADS
6.0000 | MEDICATED_PAD | Freq: Every day | CUTANEOUS | Status: AC
Start: 2015-10-10 — End: 2015-10-15
  Administered 2015-10-10 – 2015-10-14 (×4): 6 via TOPICAL

## 2015-10-10 MED ORDER — BISACODYL 5 MG PO TBEC: 10.0000 mg | DELAYED_RELEASE_TABLET | Freq: Every day | ORAL | Status: DC

## 2015-10-10 MED ORDER — DEXMEDETOMIDINE HCL IN NACL 200 MCG/50ML IV SOLN: 0.0000 ug/kg/h | INTRAVENOUS | Status: DC

## 2015-10-10 MED ORDER — LACTATED RINGERS IV SOLN
INTRAVENOUS | Status: DC | PRN
  Administered 2015-10-10 (×2): via INTRAVENOUS

## 2015-10-10 MED ORDER — INSULIN REGULAR BOLUS VIA INFUSION
0.0000 [IU] | Freq: Three times a day (TID) | INTRAVENOUS | Status: DC
  Filled 2015-10-10: qty 10

## 2015-10-10 MED FILL — Lidocaine HCl IV Inj 20 MG/ML: INTRAVENOUS | Qty: 5 | Status: AC

## 2015-10-10 MED FILL — Electrolyte-R (PH 7.4) Solution: INTRAVENOUS | Qty: 4000 | Status: AC

## 2015-10-10 MED FILL — Sodium Chloride IV Soln 0.9%: INTRAVENOUS | Qty: 3000 | Status: AC

## 2015-10-10 MED FILL — Mannitol IV Soln 20%: INTRAVENOUS | Qty: 500 | Status: AC

## 2015-10-10 MED FILL — Sodium Bicarbonate IV Soln 8.4%: INTRAVENOUS | Qty: 50 | Status: AC

## 2015-10-10 MED FILL — Heparin Sodium (Porcine) Inj 1000 Unit/ML: INTRAMUSCULAR | Qty: 20 | Status: AC

## 2015-10-10 SURGICAL SUPPLY — 102 items
ADAPTER CARDIO PERF ANTE/RETRO (ADAPTER) ×4 IMPLANT
BAG DECANTER FOR FLEXI CONT (MISCELLANEOUS) ×4 IMPLANT
BLADE STERNUM SYSTEM 6 (BLADE) ×4 IMPLANT
BLADE SURG 15 STRL LF DISP TIS (BLADE) ×4 IMPLANT
BLADE SURG 15 STRL SS (BLADE) ×4
CANISTER SUCTION 2500CC (MISCELLANEOUS) ×4 IMPLANT
CANN PRFSN 3/8XCNCT ST RT ANG (MISCELLANEOUS) ×2
CANNULA ARTERIAL NVNT 3/8 22FR (MISCELLANEOUS) IMPLANT
CANNULA GUNDRY RCSP 15FR (MISCELLANEOUS) IMPLANT
CANNULA PRFSN 3/8XCNCT RT ANG (MISCELLANEOUS) ×2 IMPLANT
CANNULA SUMP PERICARDIAL (CANNULA) ×4 IMPLANT
CANNULA VEN MTL TIP RT (MISCELLANEOUS) ×2
CANNULA VRC MALB SNGL STG 32FR (MISCELLANEOUS) ×2 IMPLANT
CATH ROBINSON RED A/P 18FR (CATHETERS) ×8 IMPLANT
CLIP FOGARTY SPRING 6M (CLIP) IMPLANT
CONN 1/2X1/2X1/2  BEN (MISCELLANEOUS) ×2
CONN 1/2X1/2X1/2 BEN (MISCELLANEOUS) ×2 IMPLANT
CONN 3/8X1/2 ST GISH (MISCELLANEOUS) ×8 IMPLANT
CONN ST 1/4X3/8  BEN (MISCELLANEOUS) ×4
CONN ST 1/4X3/8 BEN (MISCELLANEOUS) ×4 IMPLANT
CONN Y 3/8X3/8X3/8  BEN (MISCELLANEOUS)
CONN Y 3/8X3/8X3/8 BEN (MISCELLANEOUS) IMPLANT
CONT SPEC 4OZ CLIKSEAL STRL BL (MISCELLANEOUS) ×4 IMPLANT
CRADLE DONUT ADULT HEAD (MISCELLANEOUS) ×4 IMPLANT
DRAIN CHANNEL 32F RND 10.7 FF (WOUND CARE) ×8 IMPLANT
DRAPE CARDIOVASCULAR INCISE (DRAPES) ×2
DRAPE SLUSH/WARMER DISC (DRAPES) IMPLANT
DRAPE SRG 135X102X78XABS (DRAPES) ×2 IMPLANT
DRSG AQUACEL AG ADV 3.5X14 (GAUZE/BANDAGES/DRESSINGS) ×4 IMPLANT
DRSG COVADERM 4X14 (GAUZE/BANDAGES/DRESSINGS) ×4 IMPLANT
ELECT CAUTERY BLADE 6.4 (BLADE) IMPLANT
ELECT REM PT RETURN 9FT ADLT (ELECTROSURGICAL) ×8
ELECTRODE REM PT RTRN 9FT ADLT (ELECTROSURGICAL) ×4 IMPLANT
GAUZE SPONGE 4X4 12PLY STRL (GAUZE/BANDAGES/DRESSINGS) ×8 IMPLANT
GAUZE SPONGE 4X4 16PLY XRAY LF (GAUZE/BANDAGES/DRESSINGS) ×4 IMPLANT
GLOVE BIOGEL PI IND STRL 6 (GLOVE) ×8 IMPLANT
GLOVE BIOGEL PI INDICATOR 6 (GLOVE) ×8
GLOVE SURG SIGNA 7.5 PF LTX (GLOVE) IMPLANT
GOWN STRL REUS W/ TWL LRG LVL3 (GOWN DISPOSABLE) ×8 IMPLANT
GOWN STRL REUS W/TWL LRG LVL3 (GOWN DISPOSABLE) ×8
HEMOSTAT POWDER SURGIFOAM 1G (HEMOSTASIS) IMPLANT
HEMOSTAT SURGICEL 2X14 (HEMOSTASIS) IMPLANT
INSERT FOGARTY XLG (MISCELLANEOUS) IMPLANT
KIT BASIN OR (CUSTOM PROCEDURE TRAY) ×4 IMPLANT
KIT ROOM TURNOVER OR (KITS) ×4 IMPLANT
KIT SUCTION CATH 14FR (SUCTIONS) ×4 IMPLANT
LINE VENT (MISCELLANEOUS) ×4 IMPLANT
LOOP VESSEL SUPERMAXI WHITE (MISCELLANEOUS) ×4 IMPLANT
NS IRRIG 1000ML POUR BTL (IV SOLUTION) ×16 IMPLANT
PACK OPEN HEART (CUSTOM PROCEDURE TRAY) ×4 IMPLANT
PAD ARMBOARD 7.5X6 YLW CONV (MISCELLANEOUS) ×8 IMPLANT
RING ANLPLS CARP-EDW PHY II 28 (Prosthesis & Implant Heart) ×2 IMPLANT
RING ANNULOPLASTY PHYSIO II 28 (Prosthesis & Implant Heart) ×2 IMPLANT
SET CARDIOPLEGIA MPS 5001102 (MISCELLANEOUS) ×4 IMPLANT
SUCKER WEIGHTED FLEX (MISCELLANEOUS) ×4 IMPLANT
SUT ETHIBOND (SUTURE) ×8 IMPLANT
SUT ETHIBOND 2 0 SH (SUTURE) ×12 IMPLANT
SUT ETHIBOND 2 0 SH 36X2 (SUTURE) ×8 IMPLANT
SUT ETHIBOND 2 0 V4 (SUTURE) IMPLANT
SUT ETHIBOND 2 0V4 GREEN (SUTURE) IMPLANT
SUT ETHIBOND 2-0 RB-1 WHT (SUTURE) ×8 IMPLANT
SUT ETHIBOND 4 0 RB 1 (SUTURE) ×4 IMPLANT
SUT ETHIBOND 5 0 C 1 30 (SUTURE) ×32 IMPLANT
SUT PROLENE 3 0 SH DA (SUTURE) ×12 IMPLANT
SUT PROLENE 3 0 SH1 36 (SUTURE) ×4 IMPLANT
SUT PROLENE 4 0 RB 1 (SUTURE) ×6
SUT PROLENE 4 0 SH DA (SUTURE) ×8 IMPLANT
SUT PROLENE 4-0 RB1 .5 CRCL 36 (SUTURE) ×6 IMPLANT
SUT PROLENE 5 0 C 1 36 (SUTURE) ×24 IMPLANT
SUT PROLENE 5 0 CC1 (SUTURE) ×4 IMPLANT
SUT PROLENE 6 0 C 1 30 (SUTURE) ×8 IMPLANT
SUT PROLENE 8 0 BV175 6 (SUTURE) ×8 IMPLANT
SUT SILK  1 MH (SUTURE) ×6
SUT SILK 1 MH (SUTURE) ×6 IMPLANT
SUT SILK 1 TIES 10X30 (SUTURE) ×8 IMPLANT
SUT SILK 2 0 (SUTURE) ×2
SUT SILK 2 0 SH CR/8 (SUTURE) ×8 IMPLANT
SUT SILK 2 0 TIES 10X30 (SUTURE) ×4 IMPLANT
SUT SILK 2 0 TIES 17X18 (SUTURE) ×2
SUT SILK 2-0 18XBRD TIE 12 (SUTURE) ×2 IMPLANT
SUT SILK 2-0 18XBRD TIE BLK (SUTURE) ×2 IMPLANT
SUT SILK 3 0 SH CR/8 (SUTURE) ×8 IMPLANT
SUT SILK 4 0 (SUTURE) ×2
SUT SILK 4 0 TIE 10X30 (SUTURE) ×8 IMPLANT
SUT SILK 4-0 18XBRD TIE 12 (SUTURE) ×2 IMPLANT
SUT STEEL 6MS V (SUTURE) IMPLANT
SUT STEEL SZ 6 DBL 3X14 BALL (SUTURE) ×4 IMPLANT
SUT TEM PAC WIRE 2 0 SH (SUTURE) ×20 IMPLANT
SUT VIC AB 1 CTX 36 (SUTURE)
SUT VIC AB 1 CTX36XBRD ANBCTR (SUTURE) IMPLANT
SUT VIC AB 2-0 CTX 27 (SUTURE) IMPLANT
SUT VIC AB 3-0 X1 27 (SUTURE) IMPLANT
SUT VICRYL 2 0 J607H (SUTURE) ×8 IMPLANT
SUT VICRYL 3 0 (SUTURE) ×8 IMPLANT
SYSTEM SAHARA CHEST DRAIN ATS (WOUND CARE) ×4 IMPLANT
TAPE CLOTH SURG 4X10 WHT LF (GAUZE/BANDAGES/DRESSINGS) ×4 IMPLANT
TOWEL OR 17X24 6PK STRL BLUE (TOWEL DISPOSABLE) ×4 IMPLANT
TOWEL OR 17X26 10 PK STRL BLUE (TOWEL DISPOSABLE) ×4 IMPLANT
TRAY FOLEY IC TEMP SENS 16FR (CATHETERS) ×4 IMPLANT
UNDERPAD 30X30 INCONTINENT (UNDERPADS AND DIAPERS) ×4 IMPLANT
VRC MALLEABLE SINGLE STG 32FR (MISCELLANEOUS) ×4
WATER STERILE IRR 1000ML POUR (IV SOLUTION) ×8 IMPLANT

## 2015-10-10 NOTE — Anesthesia Preprocedure Evaluation (Signed)
Anesthesia Evaluation  Patient identified by MRN, date of birth, ID band Patient awake    Reviewed: Allergy & Precautions, NPO status , Patient's Chart, lab work & pertinent test results  Airway Mallampati: II  TM Distance: >3 FB Neck ROM: Full    Dental   Pulmonary shortness of breath, former smoker,    breath sounds clear to auscultation       Cardiovascular hypertension, + Peripheral Vascular Disease and +CHF   Rhythm:Regular Rate:Normal     Neuro/Psych  Neuromuscular disease    GI/Hepatic Neg liver ROS, GERD  ,  Endo/Other  Hypothyroidism   Renal/GU negative Renal ROS     Musculoskeletal  (+) Arthritis ,   Abdominal   Peds  Hematology   Anesthesia Other Findings   Reproductive/Obstetrics                             Anesthesia Physical Anesthesia Plan  ASA: IV  Anesthesia Plan: General   Post-op Pain Management:    Induction: Intravenous  Airway Management Planned: Oral ETT  Additional Equipment: Arterial line and PA Cath  Intra-op Plan:   Post-operative Plan: Possible Post-op intubation/ventilation  Informed Consent: I have reviewed the patients History and Physical, chart, labs and discussed the procedure including the risks, benefits and alternatives for the proposed anesthesia with the patient or authorized representative who has indicated his/her understanding and acceptance.   Dental advisory given  Plan Discussed with: CRNA and Anesthesiologist  Anesthesia Plan Comments:         Anesthesia Quick Evaluation

## 2015-10-10 NOTE — Progress Notes (Signed)
[facility] BRIEF SICU PROGRESS NOTE  Day of Surgery  S/P Procedure(s) (LRB): MITRAL VALVE REPAIR (MVR) WITH SIZE 28 CARPENTIER-EDWARDS PHYSIO II ANNULOPLASTY RING (N/A) TRANSESOPHAGEAL ECHOCARDIOGRAM (TEE) (N/A)   Extubated uneventfully Neuro grossly intact NSR w/ stable hemodynamics on low dose dopamine and Neo drips Chest tube output low UOP adequate  Plan: Continue routine early postop  Rexene Alberts, MD 10/10/2015 9:41 PM

## 2015-10-10 NOTE — H&P (View-Only) (Signed)
1 Day Post-Op Procedure(s) (LRB): Right/Left Heart Cath and Coronary Angiography (N/A) Subjective: Feels better but still gets SOB with relatively minor activity   Objective: Vital signs in last 24 hours: Temp:  [97.7 F (36.5 C)-98.3 F (36.8 C)] 97.7 F (36.5 C) (10/07 1232) Pulse Rate:  [65-94] 94 (10/07 1236) Cardiac Rhythm:  [-] Normal sinus rhythm (10/07 0700) Resp:  [17-19] 17 (10/07 1232) BP: (108-134)/(44-48) 120/48 mmHg (10/07 1232) SpO2:  [97 %-100 %] 97 % (10/07 1236) Weight:  [114 lb 1.6 oz (51.755 kg)] 114 lb 1.6 oz (51.755 kg) (10/07 0500)  Hemodynamic parameters for last 24 hours:    Intake/Output from previous day: 10/06 0701 - 10/07 0700 In: 631.7 [P.O.:460; I.V.:171.7] Out: 200 [Urine:200] Intake/Output this shift: Total I/O In: 700 [P.O.:700] Out: -   General appearance: alert, cooperative and no distress Heart: regular rate and rhythm and systolic murmur Lungs: rales bibasilar  Lab Results:  Recent Labs  10/04/15 0528 10/05/15 0424  WBC 9.1 7.0  HGB 12.8 11.6*  HCT 38.2 34.3*  PLT 232 209   BMET:  Recent Labs  10/04/15 0528 10/05/15 0424  NA 138 137  K 3.7 3.3*  CL 102 101  CO2 26 25  GLUCOSE 103* 82  BUN 25* 21*  CREATININE 0.76 0.74  CALCIUM 9.3 9.1    PT/INR:  Recent Labs  10/04/15 0706  LABPROT 13.6  INR 1.02   ABG    Component Value Date/Time   PHART 7.299* 10/04/2015 1414   HCO3 22.3 10/04/2015 1414   TCO2 24 10/04/2015 1414   ACIDBASEDEF 4.0* 10/04/2015 1414   O2SAT 96.0 10/04/2015 1414   CBG (last 3)  No results for input(s): GLUCAP in the last 72 hours.  Assessment/Plan: S/P Procedure(s) (LRB): Right/Left Heart Cath and Coronary Angiography (N/A) -  I met with Mrs/ Cunliffe and her daughter [name] again today. After talking with our office it would likely be ~ 2 weeks before she could have minimally invasive right chest repair. She prefers to have it done sooner. Therefore we will plan to go ahead on  Wednesday 10/10/15.  I discussed the operation with them. She is aware of the general nature of the procedure, the incisions to be used, the hospital stay and overall recovery. I reviewed the indications, risks, benefits and alternatives. She understands the risks include but are not limited to death, MI, DVT, PE, stroke, bleeding, blood transfusions, infection, heart block, arrhythmias, and other organ system dysfunction including respiratory or renal failure and Gi complications. She accepts the risks and agrees to proceed.  Plan OR next Wednesday   LOS: 5 days    [name] 10/05/2015

## 2015-10-10 NOTE — Progress Notes (Signed)
Pt had mitral valve repair today.  Pt transferred to ICU post op on Dr. Leonarda Salon service.  Please re-consult if any concerns.  DTat

## 2015-10-10 NOTE — Anesthesia Procedure Notes (Addendum)
Anesthesia Procedure Note Risks, benefits, and alternatives discussed for central line placement. Patient agrees to procedure.  Patient positioned supine with mild trendelenburg.  Sterile prep and drape of Right neck.  Full barrier precautions taken.  1% injectable lidocaine placed SQ for topical anesthesia.  Central line placed with ultrasound guidance.  Appropriate blood return from catheter.  No air.  Sutured into place.  Sterile dressing applied.  No complications. Patient tolerated the procedure well. Procedure Name: Intubation Date/Time: 10/10/2015 8:42 AM Performed by: Izora Gala Pre-anesthesia Checklist: Patient identified, Emergency Drugs available, Suction available, Patient being monitored and Timeout performed Patient Re-evaluated:Patient Re-evaluated prior to inductionOxygen Delivery Method: Circle system utilized Preoxygenation: Pre-oxygenation with 100% oxygen Intubation Type: IV induction Ventilation: Mask ventilation without difficulty Laryngoscope Size: Miller and 3 Grade View: Grade II Tube type: Oral Tube size: 7.0 mm Number of attempts: 1 Airway Equipment and Method: Stylet Placement Confirmation: ETT inserted through vocal cords under direct vision,  positive ETCO2 and breath sounds checked- equal and bilateral Secured at: 22 cm Tube secured with: Tape Dental Injury: Teeth and Oropharynx as per pre-operative assessment

## 2015-10-10 NOTE — Progress Notes (Signed)
  Amy Fischer is an 81 y.o. female w/ PMH of HLD, Hypothyroidism, [name] who presented to [facility] ED on 09/30/2015 for shortness of breath. Found to have acute diastolic CHF likely secondary to severe MR.   TEE on 10/03/2015 showed flail P2 and ruptured chord with severe MR.   She is undergoing Mitral Valve Repair by Dr. Roxan Hockey today. We will continue to follow after surgery.  Signed, Erma Heritage, PA-C 10/10/2015, 8:11 AM Pager: [phone]

## 2015-10-10 NOTE — Interval H&P Note (Signed)
History and Physical Interval Note:  10/10/2015 8:16 AM  Amy Fischer  has presented today for surgery, with the diagnosis of SEVERE MR  The various methods of treatment have been discussed with the patient and family. After consideration of risks, benefits and other options for treatment, the patient has consented to  Procedure(s): MITRAL VALVE REPAIR (MVR) (N/A) TRANSESOPHAGEAL ECHOCARDIOGRAM (TEE) (N/A) as a surgical intervention .  The patient's history has been reviewed, patient examined, no change in status, stable for surgery.  I have reviewed the patient's chart and labs.  Questions were answered to the patient's satisfaction.     [name]

## 2015-10-10 NOTE — Progress Notes (Signed)
  Echocardiogram 2D Echocardiogram has been performed.  Donata Clay 10/10/2015, 9:37 AM

## 2015-10-10 NOTE — Procedures (Signed)
Extubation Procedure Note  Patient Details:   Name: Amy Fischer DOB: Feb 06, 1929 MRN: [id number]   Airway Documentation:     Evaluation  O2 sats: stable throughout Complications: No apparent complications Patient did tolerate procedure well. Bilateral Breath Sounds: Clear, Diminished Suctioning: Oral, Airway Yes   Patient extubated to [employer]. Vital signs stable throughout. Patient tolerated well. No complications. RN at bedside. RT will continue to monitor.   Mcneil Sober 10/10/2015, 6:10 PM

## 2015-10-10 NOTE — Transfer of Care (Signed)
Immediate Anesthesia Transfer of Care Note  Patient: Amy Fischer  Procedure(s) Performed: Procedure(s): MITRAL VALVE REPAIR (MVR) WITH SIZE 28 CARPENTIER-EDWARDS PHYSIO II ANNULOPLASTY RING (N/A) TRANSESOPHAGEAL ECHOCARDIOGRAM (TEE) (N/A)  Patient Location: SICU  Anesthesia Type:General  Level of Consciousness: Patient remains intubated per anesthesia plan  Airway & Oxygen Therapy: Patient remains intubated per anesthesia plan and Patient placed on Ventilator (see vital sign flow sheet for setting)  Post-op Assessment: Report given to RN and Post -op Vital signs reviewed and stable  Post vital signs: Reviewed and stable  Last Vitals:  Filed Vitals:   10/10/15 0419  BP: 117/41  Pulse: 77  Temp: 36.5 C  Resp: 18    Complications: No apparent anesthesia complications

## 2015-10-10 NOTE — Brief Op Note (Addendum)
09/30/2015 - 10/10/2015  11:35 AM  PATIENT:  Amy Fischer  81 y.o. female  PRE-OPERATIVE DIAGNOSIS:  SEVERE MITRAL REGURGITATION  POST-OPERATIVE DIAGNOSIS:  SEVERE MITRAL REGURGITATION  PROCEDURE:   MITRAL VALVE REPAIR   Triangular resection of posterior leaflet P2  28 mm Edwards Physio II annuloplasty ring  SURGEON:  [name], MD  ASSISTANT: Suzzanne Cloud, PA-C  ANESTHESIA:   general  PATIENT CONDITION:  ICU - intubated and hemodynamically stable.  PRE-OPERATIVE WEIGHT: 51 kg  XC= 82 min CPB= 140 min  Off CPB on 2nd attempt with dopamine at 5 mcg/kg/min and DDD paced at 90 No residual MR on postbypass TEE   Mitral Valve Etiology  MV Insufficiency: Severe  MV Disease: Yes.  MV Stenosis: No mitral valve stenosis.  MV Disease Functional Class: MV Disease Functional Class:- TYPE II  Etiology (Choose at least one and up to five): Degenerative.  MV Lesions (Choose at least one): Leaflet prolapse, posterior. and Annular dilation.Ruptured chord   Mitral Valve Procedure  Mitral Valve Procedure Performed:  Repair: Annuloplasty., Leaflet Resection. Resection Type:Triangular. Mitral Leaflet Resection Location: Posterior. and Sliding Plasty.  Implant: Annuloplasty Device: Implant model number 3009, Size 28, Unique Device Identifier [id number].

## 2015-10-10 NOTE — OR Nursing (Signed)
SICU Calls Amy Fischer) 1st call @ [phone]; 2nd call @ 1230; 3rd call @ 1259 and final call @ [place]

## 2015-10-11 ENCOUNTER — Encounter (HOSPITAL_COMMUNITY): Payer: Self-pay | Admitting: Thoracic Surgery (Cardiothoracic Vascular Surgery)

## 2015-10-11 ENCOUNTER — Inpatient Hospital Stay (HOSPITAL_COMMUNITY): Payer: Medicare Other

## 2015-10-11 DIAGNOSIS — Z9889 Other specified postprocedural states: Secondary | ICD-10-CM

## 2015-10-11 LAB — GLUCOSE, CAPILLARY
GLUCOSE-CAPILLARY: 103 mg/dL — AB (ref 65–99)
GLUCOSE-CAPILLARY: 128 mg/dL — AB (ref 65–99)
GLUCOSE-CAPILLARY: 102 mg/dL — AB (ref 65–99)

## 2015-10-11 LAB — BASIC METABOLIC PANEL
ANION GAP: 5 (ref 5–15)
BUN: 13 mg/dL (ref 6–20)
CALCIUM: 7.2 mg/dL — AB (ref 8.9–10.3)
CO2: 24 mmol/L (ref 22–32)
Chloride: 108 mmol/L (ref 101–111)
Creatinine, Ser: 0.72 mg/dL (ref 0.44–1.00)
GFR calc Af Amer: >60 mL/min (ref >60)
GLUCOSE: 110 mg/dL — AB (ref 65–99)
Potassium: 4.3 mmol/L (ref 3.5–5.1)
Sodium: 137 mmol/L (ref 135–145)

## 2015-10-11 LAB — POCT I-STAT, CHEM 8
BUN: 16 mg/dL (ref 6–20)
Calcium, Ion: 1.07 mmol/L — ABNORMAL LOW (ref 1.13–1.30)
Chloride: 103 mmol/L (ref 101–111)
Creatinine, Ser: 0.7 mg/dL (ref 0.44–1.00)
Glucose, Bld: 131 mg/dL — ABNORMAL HIGH (ref 65–99)
HEMATOCRIT: 36 % (ref 36.0–46.0)
HEMOGLOBIN: 12.2 g/dL (ref 12.0–15.0)
POTASSIUM: 4.2 mmol/L (ref 3.5–5.1)
Sodium: 136 mmol/L (ref 135–145)
TCO2: 19 mmol/L (ref 0–100)

## 2015-10-11 LAB — URINE CULTURE: CULTURE: NO GROWTH

## 2015-10-11 LAB — CBC
HEMATOCRIT: 34.3 % — AB (ref 36.0–46.0)
HEMATOCRIT: 35.6 % — AB (ref 36.0–46.0)
HEMOGLOBIN: 11.7 g/dL — AB (ref 12.0–15.0)
Hemoglobin: 11.4 g/dL — ABNORMAL LOW (ref 12.0–15.0)
MCH: 31.1 pg (ref 26.0–34.0)
MCH: 30.7 pg (ref 26.0–34.0)
MCHC: 33.2 g/dL (ref 30.0–36.0)
MCHC: 32.9 g/dL (ref 30.0–36.0)
MCV: 93.5 fL (ref 78.0–100.0)
MCV: 93.4 fL (ref 78.0–100.0)
PLATELETS: 108 10*3/uL — AB (ref 150–400)
PLATELETS: 105 10*3/uL — AB (ref 150–400)
RBC: 3.67 MIL/uL — ABNORMAL LOW (ref 3.87–5.11)
RBC: 3.81 MIL/uL — AB (ref 3.87–5.11)
RDW: 15.7 % — AB (ref 11.5–15.5)
RDW: 15.7 % — ABNORMAL HIGH (ref 11.5–15.5)
WBC: 17.6 10*3/uL — AB (ref 4.0–10.5)
WBC: 21.1 10*3/uL — AB (ref 4.0–10.5)

## 2015-10-11 LAB — CREATININE, SERUM
Creatinine, Ser: 0.86 mg/dL (ref 0.44–1.00)
GFR calc non Af Amer: >60 mL/min (ref >60)

## 2015-10-11 LAB — MAGNESIUM
Magnesium: 2.5 mg/dL — ABNORMAL HIGH (ref 1.7–2.4)
Magnesium: 2.3 mg/dL (ref 1.7–2.4)

## 2015-10-11 LAB — HEMOGLOBIN A1C
Hgb A1c MFr Bld: 5.6 % (ref 4.8–5.6)
Mean Plasma Glucose: 114 mg/dL

## 2015-10-11 MED ORDER — LOPERAMIDE HCL 2 MG PO CAPS
4.0000 mg | ORAL_CAPSULE | Freq: Four times a day (QID) | ORAL | Status: DC | PRN
  Administered 2015-10-12 – 2015-10-20 (×6): 4 mg via ORAL
  Filled 2015-10-11 (×9): qty 2

## 2015-10-11 MED ORDER — FLORANEX PO PACK
1.0000 g | PACK | Freq: Once | ORAL | Status: AC
  Administered 2015-10-12: 1 g via ORAL
  Filled 2015-10-11 (×2): qty 1

## 2015-10-11 MED ORDER — FUROSEMIDE 10 MG/ML IJ SOLN
40.0000 mg | Freq: Once | INTRAMUSCULAR | Status: AC
  Administered 2015-10-11: 40 mg via INTRAVENOUS
  Filled 2015-10-11: qty 4

## 2015-10-11 MED ORDER — FUROSEMIDE 10 MG/ML IJ SOLN
20.0000 mg | Freq: Two times a day (BID) | INTRAMUSCULAR | Status: DC
  Administered 2015-10-11 (×2): 20 mg via INTRAVENOUS
  Filled 2015-10-11 (×2): qty 2

## 2015-10-11 MED ORDER — INSULIN ASPART 100 UNIT/ML ~~LOC~~ SOLN: 0.0000 [IU] | SUBCUTANEOUS | Status: DC

## 2015-10-11 MED ORDER — INSULIN ASPART 100 UNIT/ML ~~LOC~~ SOLN
0.0000 [IU] | SUBCUTANEOUS | Status: DC
  Administered 2015-10-11: 4 [IU] via SUBCUTANEOUS
  Administered 2015-10-11: 2 [IU] via SUBCUTANEOUS

## 2015-10-11 MED ORDER — ALBUMIN HUMAN 5 % IV SOLN
INTRAVENOUS | Status: AC
  Administered 2015-10-11: 12.5 g
  Filled 2015-10-11: qty 250

## 2015-10-11 MED ORDER — ALBUMIN HUMAN 5 % IV SOLN
12.5000 g | Freq: Once | INTRAVENOUS | Status: AC
  Administered 2015-10-11: 12.5 g via INTRAVENOUS
  Filled 2015-10-11 (×2): qty 250

## 2015-10-11 MED ORDER — ENOXAPARIN SODIUM 30 MG/0.3ML ~~LOC~~ SOLN
30.0000 mg | Freq: Every day | SUBCUTANEOUS | Status: DC
  Administered 2015-10-11: 30 mg via SUBCUTANEOUS
  Filled 2015-10-11 (×2): qty 0.3

## 2015-10-11 MED ORDER — METOCLOPRAMIDE HCL 5 MG/ML IJ SOLN
10.0000 mg | Freq: Four times a day (QID) | INTRAMUSCULAR | Status: DC
  Administered 2015-10-11 – 2015-10-12 (×5): 10 mg via INTRAVENOUS
  Filled 2015-10-11 (×8): qty 2

## 2015-10-11 MED FILL — Magnesium Sulfate Inj 50%: INTRAMUSCULAR | Qty: 10 | Status: AC

## 2015-10-11 MED FILL — Potassium Chloride Inj 2 mEq/ML: INTRAVENOUS | Qty: 40 | Status: AC

## 2015-10-11 MED FILL — Heparin Sodium (Porcine) Inj 1000 Unit/ML: INTRAMUSCULAR | Qty: 30 | Status: AC

## 2015-10-11 NOTE — Op Note (Signed)
Amy Fischer, Amy Fischer NO.:  [id number]  MEDICAL RECORD NO.:  [id number]  LOCATION:  2S11C                        FACILITY:  [facility]  PHYSICIAN:  Revonda Standard. Roxan Hockey, M.D.DATE OF BIRTH:  11-Feb-1929  DATE OF PROCEDURE:  10/10/2015 DATE OF DISCHARGE:                              OPERATIVE REPORT   PREOPERATIVE DIAGNOSIS:  Severe mitral regurgitation with class 4 congestive heart failure.  POSTOPERATIVE DIAGNOSIS:  Severe mitral regurgitation with class 4 congestive heart failure.  PROCEDURE:   Median sternotomy, extracorporeal circulation, Mitral valve repair with triangular resection of posterior leaflet (P2) and 28 mm Edwards physio II annuloplasty ring (model #[id number], serial number [id number]).  SURGEON:  Revonda Standard. Roxan Hockey, MD  FIRST ASSISTANT:  Suzzanne Cloud, PA-C  ANESTHESIA:  General.  FINDINGS:  Prebypass transesophageal echocardiography revealed severe mitral regurgitation with a flail segment of the posterior leaflet, mild annular dilatation.  INTRAOPERATIVE FINDINGS:  Ruptured cord with flail segment of P2. Small prolapsing area of the anterior leaflet at A2-A3 junction.  No residual mitral regurgitation post bypass.  CLINICAL NOTE:  Amy Fischer is an 81 year old woman who presented with class 4 congestive heart failure.  Workup included a transthoracic echocardiogram, which originally was moderate mitral regurgitation, but on re-review was found to have severe regurgitation.  A transesophageal echocardiogram confirmed severe mitral regurgitation with a prolapsing flow segment of P2.  Cardiac catheterization revealed no significant coronary artery disease.  The patient was advised to undergo mitral valve repair.  The indications, risks, benefits, and alternatives were discussed in detail with the patient.  She understood and accepted the risks and agreed to proceed.  OPERATIVE NOTE:  Amy Fischer was brought to the preoperative holding area  on October 10, 2015.  Anesthesia placed a Swan-Ganz catheter and an arterial blood pressure monitoring line.  She was taken to the operating room, anesthetized and intubated.  Intravenous antibiotics were administered.  A Foley catheter was placed.  Transesophageal echocardiography was performed and revealed severe mitral regurgitation with a flail segment P2 of the posterior leaflet.  There was preserved left ventricular function.  No other significant valvular pathology. The chest, abdomen, and legs were prepped and draped in usual sterile fashion.  A median sternotomy was performed.  Hemostasis was achieved. A sternal retractor was placed.  The patient was fully heparinized.  The pericardium was opened.  The ascending aorta was inspected, it was of normal caliber.  There was plaque in the distal ascending aorta, near the takeoff of the innominate artery.  This area was avoided with cannulation and clamping.  After confirming adequate anticoagulation with ACT measurement, the aorta was cannulated via concentric 2-0 Ethibond pledgeted pursestring sutures.  A 28-French right angle venous cannula was placed via a pursestring suture in the superior vena cava. Cardiopulmonary bypass was initiated.  A 32-French venous cannula was placed in the inferior vena cava through a pursestring suture in the inferior aspect of the right atrium.  This was connected to the circuit. Flows were maintained per protocol.  The patient was cooled to 32 degrees Celsius.  A retrograde cardioplegia cannula was placed via pursestring suture in the right atrium and directed into the coronary sinus.  A  foam pad was placed in the pericardium to insulate the heart. A temperature probe was placed in myocardial septum and a cardioplegia cannula was placed in the ascending aorta.  Carbon dioxide was insufflated into the operative field.   The aorta was crossclamped.  The left ventricle was emptied via the aortic root  vent.  Cardiac arrest then was achieved with combination of cold antegrade and retrograde blood cardioplegia and topical iced saline.  A 1 L of cardioplegia was administered.  An initial 500 mL was given antegrade.  There was a rapid diastolic arrest.  An additional 500 mL was given retrograde and there was septal cooling to 7 degrees Celsius. Additional cardioplegia was administered at 20-minute intervals during the procedure via the retrograde cannula.  A left atriotomy was performed.  Mitral retractor was placed.  The mitral valve was visualized.  There was good exposure.  There was a prolapsing flail segment of P2 with ruptured chordae tendineae.  P1 and P3 were not prolapsing.  The anterior leaflet was relatively large, but there was no definite major prolapse noted on initial evaluation.  A triangular resection was performed of the flail portion of P2 and the posterior leaflet then was reconstructed with interrupted 4 and 5-0 Ethibond sutures.  The left ventricle was distended with iced saline. There was a trace residual leak.  The anterior leaflet sized for a 28 mm Edwards Physio II ring. 2-0 Ethibond horizontal mattress sutures then were placed in the anulus circumferentially.  The commissural sutures also measured for a 28 mm annuloplasty ring.  The sutures then were placed through the sewing cuff of the ring which was lowered into place and the sutures were sequentially tied.  With instillation of iced saline into the left ventricle, there was a leak which initially appeared to be at the P2-P3 junction; however it was determined that this was coming from a small prolapsing area in the anterior leaflet at the A2-A3 junction.  A single 5-0 Ethibond suture was used to imbricate this area and that resulted in resolution of the leak.  Rewarming was begun.  The left atriotomy then was closed in 2 layers.  A vent was left across the mitral valve while closing the left atrium.  The 1st  layer of the atrial closure was a running 4-0 Prolene horizontal mattress suture followed by a running 4-0 Prolene simple suture.  At the completion of the 1st layer before tying the suture, de-airing maneuvers were performed and the left ventricular vent was removed.  The 2nd layer of Prolene then was run.  The patient was placed in Trendelenburg position. A warm dose of retrograde cardioplegia was administered and the aortic crossclamp was removed.  The total crossclamp time was 82 minutes.  The patient did not require defibrillation.  She was in heart block.  The retrograde cardioplegia cannula was removed.  The superior vena caval cannula was redirected into the right atrium.  The inferior vena caval cannula was removed.  Epicardial pacing wires were placed on the right ventricle and right atrium and DDD pacing was initiated at 90 beats per minute.  The patient had relatively low urine output on bypass and a dopamine infusion had been initiated at 3 mcg/kg/minute during the bypass run, this was left on for weaning.  When the patient had rewarmed to a core temperature of 37 degrees Celsius, an initial attempt was made to wean from bypass. Shortly after discontinuing bypass the heart became distended and the blood pressure dropped and  the patient was placed back on bypass.  The heart was allowed to rest for 20 minutes.  On the 2nd attempt, the dopamine was increased to 5 mcg/kg/minute.  The patient remained DDD paced and she weaned from bypass without difficulty.  The total bypass time was 140 minutes.  The Swan-Ganz catheter was in the right ventricle and could not be advanced into the pulmonary artery at this point in time, but was later in the case. Postbypass transesophageal echocardiography revealed a mild gradient across the mitral valve.  There was no residual mitral regurgitation. There was good left ventricular function.  There was no residual air.  A test dose of protamine was  administered and was well tolerated.  The superior vena caval and aortic cannulae were removed.  The remainder of the protamine was administered without incident.  The chest irrigated with warm saline.  Hemostasis was achieved.  The pericardium was not closed.  Two 32-French Blake drains were placed through separate subcostal incisions.  The sternum was closed with a combination of single and double heavy gauge stainless steel wires.  The pectoralis fascia, subcutaneous tissue, and skin were closed in standard fashion.  All sponge, needle, and instrument counts were correct at the end of the procedure.  The patient was taken from the operating room to the surgical intensive care unit in good condition.     Revonda Standard Roxan Hockey, M.D.     SCH/MEDQ  D:  10/10/2015  T:  10/11/2015  Job:  [id number]

## 2015-10-11 NOTE — Progress Notes (Signed)
CT surgery p.m. Rounds  Status post mitral valve replairfor severe MR and acute on chronic congestive heart failure Patient has required low-dose dopamine and neo- Patient receiving IV Lasix twice a day with urine output 20-30 cc per hour Evening labs reviewed demonstrate creatinine 0.7, hematocrit 36% Continue current care

## 2015-10-11 NOTE — Progress Notes (Signed)
1 Day Post-Op Procedure(s) (LRB): MITRAL VALVE REPAIR (MVR) WITH SIZE 28 CARPENTIER-EDWARDS PHYSIO II ANNULOPLASTY RING (N/A) TRANSESOPHAGEAL ECHOCARDIOGRAM (TEE) (N/A) Subjective: C/o incisional pain. "Feels like I can't get a deep breath"  Objective: Vital signs in last 24 hours: Temp:  [97.5 F (36.4 C)-101.3 F (38.5 C)] 101.3 F (38.5 C) (10/13 0600) Pulse Rate:  [76-96] 80 (10/13 0600) Cardiac Rhythm:  [-] Atrial paced (10/13 0400) Resp:  [12-24] 18 (10/13 0600) BP: (86-123)/(34-68) 97/37 mmHg (10/13 0600) SpO2:  [91 %-99 %] 93 % (10/13 0600) Arterial Line BP: (79-129)/(42-56) 105/42 mmHg (10/13 0600) FiO2 (%):  [40 %-50 %] 40 % (10/12 1655) Weight:  [127 lb 3.3 oz (57.7 kg)] 127 lb 3.3 oz (57.7 kg) (10/13 0500)  Hemodynamic parameters for last 24 hours: PAP: (34-48)/(17-28) 40/18 mmHg CO:  [2.4 L/min-3.7 L/min] 2.8 L/min CI:  [1.6 L/min/m2-2.5 L/min/m2] 1.9 L/min/m2  Intake/Output from previous day: 10/12 0701 - 10/13 0700 In: 6026.3 [I.V.:4176.3; IV Piggyback:1850] Out: 4533 [Urine:1675; Blood:2480; Chest Tube:378] Intake/Output this shift:    General appearance: alert, cooperative and no distress Neurologic: intact Heart: regular rate and rhythm Lungs: diminished breath sounds bibasilar Abdomen: normal findings: soft, non-tender  Lab Results:  Recent Labs  10/10/15 1940 10/10/15 1941 10/11/15 0400  WBC 15.3*  --  17.6*  HGB 11.6* 11.6* 11.4*  HCT 34.7* 34.0* 34.3*  PLT 110*  --  108*   BMET:  Recent Labs  10/10/15 0354  10/10/15 1941 10/11/15 0400  NA 138  < > 140 137  K 3.9  < > 4.3 4.3  CL 98*  < > 105 108  CO2 27  --   --  24  GLUCOSE 94  < > 185* 110*  BUN 24*  < > 17 13  CREATININE 0.82  < > 0.70 0.72  CALCIUM 9.6  --   --  7.2*  < > = values in this interval not displayed.  PT/INR:  Recent Labs  10/10/15 1345  LABPROT 19.7*  INR 1.67*   ABG    Component Value Date/Time   PHART 7.370 10/10/2015 1846   HCO3 25.7* 10/10/2015  1846   TCO2 22 10/10/2015 1941   ACIDBASEDEF 3.0* 10/10/2015 1728   O2SAT 89.0 10/10/2015 1846   CBG (last 3)   Recent Labs  10/10/15 2241 10/10/15 2339 10/11/15 0356  GLUCAP 86 86 103*    Assessment/Plan: S/P Procedure(s) (LRB): MITRAL VALVE REPAIR (MVR) WITH SIZE 28 CARPENTIER-EDWARDS PHYSIO II ANNULOPLASTY RING (N/A) TRANSESOPHAGEAL ECHOCARDIOGRAM (TEE) (N/A) POD # 1  CV- in SR with 1st degree block, currently atrial paced for rate  Wean neo and dopamine  Will plan ASA post mitral repair unless she has atrial fib in which case she would need coumadin  RESP_ IS  RENAL- lytes and creatinine OK, volume overloaded with 3rd spacing - diurese as BP allows  ENDO- CBG low earlier- dc insulin drip  Anemia secondary to ABL/ dilution- transfused intraop, mild anemia post transfusion  SCD + enoxaparin for DVT prophylaxis  DC chest tubes  OOB, begin ambulation   LOS: 11 days    Amy Fischer 10/11/2015

## 2015-10-11 NOTE — Progress Notes (Signed)
Patient Name: Amy Fischer Date of Encounter: 10/11/2015  Principal Problem:   Respiratory distress Active Problems:   Hypothyroidism   GERD   Hypertension, isolated systolic   Acute on chronic diastolic congestive heart failure, NYHA class 3 (HCC)   Paroxysmal atrial fibrillation (HCC)   Acute respiratory failure with hypoxia and hypercapnia (HCC)   Mitral regurgitation   Left hip pain   Acute congestive heart failure (HCC)   Severe mitral regurgitation   Shortness of breath   S/P MVR (mitral valve repair)   Primary Cardiologist: Dr. Stanford Fischer Patient Profile: 81 y.o. female w/ PMH of HLD, Hypothyroidism, [name] who presented to [facility] ED on 09/30/2015 for shortness of breath. Found to have acute diastolic CHF likely secondary to severe MR. TEE on 10/03/15 showed flail P2 and ruptured chord. Underwent Mitral Valve Repair on 10/10/2015.  SUBJECTIVE: Reports feeling tired and not being able to get any sleep throughout the night. Chest tubes currently being removed.  OBJECTIVE Filed Vitals:   10/11/15 0300 10/11/15 0400 10/11/15 0500 10/11/15 0600  BP: 91/46 92/44 91/47  97/37  Pulse: 88 89 79 80  Temp: 100.4 F (38 C) 100.8 F (38.2 C) 101.1 F (38.4 C) 101.3 F (38.5 C)  TempSrc:  Core (Comment)    Resp: 18 17 21 18   Height:      Weight:   127 lb 3.3 oz (57.7 kg)   SpO2: 95% 94% 91% 93%    Intake/Output Summary (Last 24 hours) at 10/11/15 6967 Last data filed at 10/11/15 0700  Gross per 24 hour  Intake 6026.28 ml  Output   4533 ml  Net 1493.28 ml   Filed Weights   10/09/15 0648 10/10/15 0419 10/11/15 0500  Weight: 113 lb 9.6 oz (51.529 kg) 112 lb 4.8 oz (50.939 kg) 127 lb 3.3 oz (57.7 kg)    PHYSICAL EXAM General: Well developed, well nourished, elderly female in no acute distress. Head: Normocephalic, atraumatic.  Neck: Supple without bruits, JVD not elevated. Lungs:  Resp regular and unlabored, CTA anteriorly. Heart: RRR,  A-paced, no S3, S4, or murmur; no rub. Sternal dressing in place. Abdomen: Soft, non-tender, non-distended with normoactive bowel sounds. No hepatomegaly. No rebound/guarding. No obvious abdominal masses. Extremities: No clubbing, cyanosis, or edema. SCD's in place. Distal pedal pulses are 2+ bilaterally. Neuro: Alert and oriented X 3. Moves all extremities spontaneously. Psych: Normal affect.  LABS: CBC: Recent Labs  10/10/15 1940 10/10/15 1941 10/11/15 0400  WBC 15.3*  --  17.6*  HGB 11.6* 11.6* 11.4*  HCT 34.7* 34.0* 34.3*  MCV 93.0  --  93.5  PLT 110*  --  108*   INR: Recent Labs  10/10/15 1345  INR 8.93*   Basic Metabolic Panel: Recent Labs  10/10/15 0354  10/10/15 1940 10/10/15 1941 10/11/15 0400  NA 138  < >  --  140 137  K 3.9  < >  --  4.3 4.3  CL 98*  < >  --  105 108  CO2 27  --   --   --  24  GLUCOSE 94  < >  --  185* 110*  BUN 24*  < >  --  17 13  CREATININE 0.82  < > 0.69 0.70 0.72  CALCIUM 9.6  --   --   --  7.2*  MG  --   --  3.0*  --  2.5*  < > = values in this interval not displayed.  BNP:  B NATRIURETIC PEPTIDE  Date/Time Value Ref Range Status  09/30/2015 03:35 AM 460.6* 0.0 - 100.0 pg/mL Final   Hemoglobin A1C: Recent Labs  10/09/15 2332  HGBA1C 5.6   TELE:    A-paced with rate in 80's - 90's.    ECG: Sinus rhythm with 1st degree AV block.  Radiology/Studies: Dg Chest Port 1 View: 10/11/2015  CLINICAL DATA:  Status post mitral valve repair EXAM: PORTABLE CHEST 1 VIEW COMPARISON:  October 10, 2015 FINDINGS: Endotracheal tube and nasogastric tube have been removed. Other tubes and catheters are unchanged in position. No pneumothorax. Underlying emphysematous change is stable. Small pleural effusions remain with patchy bibasilar atelectasis. There has been a significant decrease in interstitial edema compared to 1 day prior. Only trace edema remains. No new opacity. Heart is upper normal in size with pulmonary vascular within normal limits.  Mitral valve replacement present. No adenopathy. Postoperative clips in the medial left supraclavicular region noted. There is old trauma involving the lateral left clavicle, stable. IMPRESSION: Less interstitial edema compared to 1 day prior. No new opacity. No change in cardiac silhouette. No pneumothorax. Electronically Signed   By: Lowella Grip III M.D.   On: 10/11/2015 08:03   Dg Chest Port 1 View: 10/10/2015  CLINICAL DATA:  Status post mitral valve replacement EXAM: PORTABLE CHEST 1 VIEW COMPARISON:  October 09, 2015 FINDINGS: Endotracheal tube tip is 2.8 cm above the carina. Swan-Ganz catheter tip is in the main pulmonary outflow tract directed toward the right. Nasogastric tube tip and side port are in the stomach. There are bilateral chest tubes. Temporary pacemaker wires are attached to the right heart. Patient is status post mitral valve replacement. No pneumothorax. There is underlying emphysematous change, stable. There are small pleural effusions bilaterally with mild pulmonary edema. There is mild atelectasis in the right mid lung. No adenopathy. There old healed rib fractures on the left, stable. There is evidence of old trauma involving the lateral left clavicle, stable. There are surgical clips in the medial left supraclavicular region, stable. IMPRESSION: Tube and catheter positions as described without pneumothorax. Interstitial edema present. No airspace consolidation. Mild mid lung atelectasis on the right. Underlying emphysematous change, stable. Electronically Signed   By: Lowella Grip III M.D.   On: 10/10/2015 14:37     Current Medications:  . acetaminophen  1,000 mg Oral 4 times per day   Or  . acetaminophen (TYLENOL) oral liquid 160 mg/5 mL  1,000 mg Per Tube 4 times per day  . albumin human  12.5 g Intravenous Once  . antiseptic oral rinse  7 mL Mouth Rinse BID  . aspirin EC  325 mg Oral Daily   Or  . aspirin  324 mg Per Tube Daily  . bisacodyl  10 mg Oral Daily     Or  . bisacodyl  10 mg Rectal Daily  . Chlorhexidine Gluconate Cloth  6 each Topical Daily  . docusate sodium  200 mg Oral Daily  . enoxaparin (LOVENOX) injection  30 mg Subcutaneous QHS  . ezetimibe  10 mg Oral Daily  . furosemide  20 mg Intravenous BID  . insulin aspart  0-24 Units Subcutaneous 6 times per day  . levofloxacin (LEVAQUIN) IV  750 mg Intravenous Q24H  . levothyroxine  100 mcg Oral QAC breakfast  . metoprolol tartrate  12.5 mg Oral BID   Or  . metoprolol tartrate  12.5 mg Per Tube BID  . multivitamin with minerals  1 tablet Oral  Daily  . mupirocin ointment  1 application Nasal BID  . omega-3 acid ethyl esters  1 g Oral Daily  . [START ON 10/12/2015] pantoprazole  40 mg Oral Daily  . rosuvastatin  2.5 mg Oral Daily  . sodium chloride  3 mL Intravenous Q12H  . vitamin E  400 Units Oral Daily   . sodium chloride 20 mL/hr at 10/10/15 1900  . sodium chloride    . sodium chloride 20 mL/hr at 10/10/15 1800  . dexmedetomidine Stopped (10/10/15 1700)  . DOPamine 5 mcg/kg/min (10/10/15 2000)  . lactated ringers 10 mL/hr at 10/10/15 1800  . nitroGLYCERIN 0 mcg/min (10/10/15 1340)  . phenylephrine (NEO-SYNEPHRINE) Adult infusion 20 mcg/min (10/11/15 0700)    ASSESSMENT AND PLAN: 1. Acute Diastolic CHF - CHF secondary to Severe MR. EF preserved at 65-70%. TEE on 10/03/2015 showed flail P2 and ruptured chord with severe MR. Cath on 10/04/2015 showed nonobstructive CAD. Underwent mitral valve repair on 10/10/2015.  - will need to continue to monitor volume status. Currently +422mL this admission, however she is currently on Neo-Synephrine following her recent surgery.  2. Severe MR, s/p repair - TEE on 10/03/2015 showed flail P2 and ruptured chord with severe MR - Had Mitral Valve Repair on 10/10/2015 with Annuloplasty ring.  Has been extubated and had chest tubes removed thus far.  3. HLD - continue statin therapy  Signed, Erma Heritage , PA-C 9:09  AM 10/11/2015 Pager: [phone] Agree with note by Guinevere Scarlet  POD #1 MV repair for flail P2 (quad resection and annuloplasty ring). Looks good. Still on neo. Atrial pacing. No longer has an MR murmur. Cont current Rx. Nl transition per TCTS.  Lorretta Harp, M.D., [place], [facility], [place] [address] [place], [place]  [address]  [phone] 10/11/2015 10:00 AM

## 2015-10-12 ENCOUNTER — Inpatient Hospital Stay (HOSPITAL_COMMUNITY): Payer: Medicare Other

## 2015-10-12 LAB — GLUCOSE, CAPILLARY
GLUCOSE-CAPILLARY: 110 mg/dL — AB (ref 65–99)
GLUCOSE-CAPILLARY: 149 mg/dL — AB (ref 65–99)
Glucose-Capillary: 109 mg/dL — ABNORMAL HIGH (ref 65–99)
Glucose-Capillary: 116 mg/dL — ABNORMAL HIGH (ref 65–99)
Glucose-Capillary: 132 mg/dL — ABNORMAL HIGH (ref 65–99)
Glucose-Capillary: 136 mg/dL — ABNORMAL HIGH (ref 65–99)

## 2015-10-12 LAB — BASIC METABOLIC PANEL
ANION GAP: 9 (ref 5–15)
Anion gap: 8 (ref 5–15)
BUN: 14 mg/dL (ref 6–20)
BUN: 20 mg/dL (ref 6–20)
CALCIUM: 6.9 mg/dL — AB (ref 8.9–10.3)
CO2: 23 mmol/L (ref 22–32)
CO2: 22 mmol/L (ref 22–32)
CREATININE: 0.74 mg/dL (ref 0.44–1.00)
Calcium: 6.8 mg/dL — ABNORMAL LOW (ref 8.9–10.3)
Chloride: 102 mmol/L (ref 101–111)
Chloride: 100 mmol/L — ABNORMAL LOW (ref 101–111)
Creatinine, Ser: 0.87 mg/dL (ref 0.44–1.00)
GFR calc Af Amer: >60 mL/min (ref >60)
GFR calc Af Amer: >60 mL/min (ref >60)
GFR, EST NON AFRICAN AMERICAN: 59 mL/min — AB (ref >60)
GLUCOSE: 138 mg/dL — AB (ref 65–99)
Glucose, Bld: 115 mg/dL — ABNORMAL HIGH (ref 65–99)
POTASSIUM: 4.3 mmol/L (ref 3.5–5.1)
Potassium: 3.4 mmol/L — ABNORMAL LOW (ref 3.5–5.1)
SODIUM: 133 mmol/L — AB (ref 135–145)
Sodium: 131 mmol/L — ABNORMAL LOW (ref 135–145)

## 2015-10-12 LAB — POCT I-STAT 4, (NA,K, GLUC, HGB,HCT)
GLUCOSE: 381 mg/dL — AB (ref 65–99)
Glucose, Bld: 365 mg/dL — ABNORMAL HIGH (ref 65–99)
HCT: 38 % (ref 36.0–46.0)
HEMATOCRIT: 36 % (ref 36.0–46.0)
HEMOGLOBIN: 12.2 g/dL (ref 12.0–15.0)
HEMOGLOBIN: 12.9 g/dL (ref 12.0–15.0)
POTASSIUM: 4 mmol/L (ref 3.5–5.1)
POTASSIUM: 4.2 mmol/L (ref 3.5–5.1)
SODIUM: 126 mmol/L — AB (ref 135–145)
SODIUM: 127 mmol/L — AB (ref 135–145)

## 2015-10-12 LAB — CBC
HCT: 34.8 % — ABNORMAL LOW (ref 36.0–46.0)
Hemoglobin: 11.6 g/dL — ABNORMAL LOW (ref 12.0–15.0)
MCH: 31.4 pg (ref 26.0–34.0)
MCHC: 33.3 g/dL (ref 30.0–36.0)
MCV: 94.3 fL (ref 78.0–100.0)
PLATELETS: 88 10*3/uL — AB (ref 150–400)
RBC: 3.69 MIL/uL — ABNORMAL LOW (ref 3.87–5.11)
RDW: 15.6 % — AB (ref 11.5–15.5)
WBC: 21.6 10*3/uL — AB (ref 4.0–10.5)

## 2015-10-12 LAB — C DIFFICILE QUICK SCREEN W PCR REFLEX
C DIFFICILE (CDIFF) INTERP: POSITIVE
C DIFFICILE (CDIFF) TOXIN: POSITIVE — AB
C Diff antigen: POSITIVE — AB

## 2015-10-12 MED ORDER — SODIUM CHLORIDE 0.9 % IV SOLN: INTRAVENOUS | Status: DC

## 2015-10-12 MED ORDER — SODIUM CHLORIDE 0.9 % IV SOLN
INTRAVENOUS | Status: DC
  Administered 2015-10-12 – 2015-10-13 (×2): via INTRAVENOUS

## 2015-10-12 MED ORDER — AMIODARONE HCL IN DEXTROSE 360-4.14 MG/200ML-% IV SOLN
30.0000 mg/h | INTRAVENOUS | Status: AC
  Administered 2015-10-12 – 2015-10-14 (×5): 30 mg/h via INTRAVENOUS
  Filled 2015-10-12 (×13): qty 200

## 2015-10-12 MED ORDER — DOPAMINE-DEXTROSE 3.2-5 MG/ML-% IV SOLN
3.0000 ug/kg/min | INTRAVENOUS | Status: DC
  Administered 2015-10-13: 3 ug/kg/min via INTRAVENOUS
  Filled 2015-10-12: qty 250

## 2015-10-12 MED ORDER — POTASSIUM CHLORIDE 10 MEQ/50ML IV SOLN
10.0000 meq | INTRAVENOUS | Status: AC
  Administered 2015-10-12 (×3): 10 meq via INTRAVENOUS
  Filled 2015-10-12: qty 50

## 2015-10-12 MED ORDER — ASPIRIN 81 MG PO CHEW
81.0000 mg | CHEWABLE_TABLET | Freq: Every day | ORAL | Status: DC
Start: 2015-10-13 — End: 2015-10-22
  Administered 2015-10-15 – 2015-10-16 (×2): 81 mg
  Filled 2015-10-12 (×4): qty 1

## 2015-10-12 MED ORDER — BISACODYL 10 MG RE SUPP: 10.0000 mg | Freq: Every day | RECTAL | Status: DC | PRN

## 2015-10-12 MED ORDER — VANCOMYCIN 50 MG/ML ORAL SOLUTION
125.0000 mg | Freq: Three times a day (TID) | ORAL | Status: DC
  Administered 2015-10-13 – 2015-10-14 (×6): 125 mg via ORAL
  Filled 2015-10-12 (×10): qty 2.5

## 2015-10-12 MED ORDER — AMIODARONE HCL IN DEXTROSE 360-4.14 MG/200ML-% IV SOLN
60.0000 mg/h | INTRAVENOUS | Status: AC
  Administered 2015-10-12 (×2): 60 mg/h via INTRAVENOUS
  Filled 2015-10-12 (×2): qty 200

## 2015-10-12 MED ORDER — ASPIRIN EC 81 MG PO TBEC
81.0000 mg | DELAYED_RELEASE_TABLET | Freq: Every day | ORAL | Status: DC
  Administered 2015-10-13 – 2015-10-22 (×8): 81 mg via ORAL
  Filled 2015-10-12 (×10): qty 1

## 2015-10-12 MED ORDER — POTASSIUM CHLORIDE 10 MEQ/50ML IV SOLN
10.0000 meq | Freq: Once | INTRAVENOUS | Status: AC
  Administered 2015-10-12: 10 meq via INTRAVENOUS

## 2015-10-12 MED ORDER — BISACODYL 5 MG PO TBEC: 10.0000 mg | DELAYED_RELEASE_TABLET | Freq: Every day | ORAL | Status: DC | PRN

## 2015-10-12 MED ORDER — FUROSEMIDE 10 MG/ML IJ SOLN
40.0000 mg | Freq: Two times a day (BID) | INTRAMUSCULAR | Status: DC
  Administered 2015-10-12 – 2015-10-13 (×2): 40 mg via INTRAVENOUS
  Filled 2015-10-12 (×3): qty 4

## 2015-10-12 MED ORDER — INSULIN ASPART 100 UNIT/ML ~~LOC~~ SOLN
0.0000 [IU] | Freq: Three times a day (TID) | SUBCUTANEOUS | Status: DC
  Administered 2015-10-12 – 2015-10-15 (×3): 1 [IU] via SUBCUTANEOUS

## 2015-10-12 MED ORDER — AMIODARONE LOAD VIA INFUSION
150.0000 mg | Freq: Once | INTRAVENOUS | Status: AC
  Administered 2015-10-12: 150 mg via INTRAVENOUS
  Filled 2015-10-12: qty 83.34

## 2015-10-12 NOTE — Care Management Note (Signed)
Case Management Note  Patient Details  Name: LITITIA SEN MRN: [id number] Date of Birth: Jun 10, 1929  Subjective/Objective:         Initially talked with patient but she did not feel well, nauseated.  States she lives at home alone, has children, but they all work so she thought she would just go to facility, short term.   Talked with daughter today, who is a Marine scientist and she plans to be off with her Mom, is working on [employer] paperwork now.  Will continue to follow.           Action/Plan:   Expected Discharge Date:  10/03/15               Expected Discharge Plan:  Home/Self Care  In-House Referral:     Discharge planning Services  CM Consult  Post Acute Care Choice:    Choice offered to:     DME Arranged:    DME Agency:     HH Arranged:    HH Agency:     Status of Service:  In process, will continue to follow  Medicare Important Message Given:  Yes-fourth notification given Date Medicare IM Given:    Medicare IM give by:    Date Additional Medicare IM Given:    Additional Medicare Important Message give by:     If discussed at [place] of Stay Meetings, dates discussed:    Additional Comments:  Vergie Living, RN 10/12/2015, 2:53 PM

## 2015-10-12 NOTE — Progress Notes (Signed)
      [address]       [place] [address]             [phone]      Feels poorly Continues to have problems with nausea, diarrhea. No emesis  BP 129/73 mmHg  Pulse 88  Temp(Src) 96 F (35.6 C) (Axillary)  Resp 28  Ht 5\' 2"  (1.575 m)  Wt 129 lb 3 oz (58.6 kg)  BMI 23.62 kg/m2  SpO2 99%  Off dopamine, still on some neo   Intake/Output Summary (Last 24 hours) at 10/12/15 1715 Last data filed at 10/12/15 1600  Gross per 24 hour  Intake 1422.41 ml  Output   1186 ml  Net 236.41 ml    UO has been relatively low. PO intake nonexistent  Check c diff again  Will dc reglan as it has not helped nausea and may be aggravating diarrhea  Will give IVF x 6 hours  Remo Lipps C. Roxan Hockey, MD [facility] [phone]

## 2015-10-12 NOTE — Progress Notes (Signed)
10/12/2015 1700 Verbal order Dr. Roxan Hockey to hold this evening dose of iv lasix. Orders enacted. Pt. Updated on plan of care. Ethlyn Alto, Arville Lime

## 2015-10-12 NOTE — Progress Notes (Signed)
Notified Dr. Roxan Hockey of BMET results.  Orders received to restart dopamine.  Will continue to monitor.  Vista Lawman, RN

## 2015-10-12 NOTE — Progress Notes (Signed)
2 Days Post-Op Procedure(s) (LRB): MITRAL VALVE REPAIR (MVR) WITH SIZE 28 CARPENTIER-EDWARDS PHYSIO II ANNULOPLASTY RING (N/A) TRANSESOPHAGEAL ECHOCARDIOGRAM (TEE) (N/A) Subjective: Doesn't feel well this AM I'm sweating  Objective: Vital signs in last 24 hours: Temp:  [97 F (36.1 C)-101.5 F (38.6 C)] 97.7 F (36.5 C) (10/14 0402) Pulse Rate:  [65-132] 65 (10/14 0700) Cardiac Rhythm:  [-] Atrial fibrillation (10/14 0600) Resp:  [13-28] 15 (10/14 0700) BP: (90-125)/(35-73) 125/73 mmHg (10/14 0600) SpO2:  [88 %-99 %] 97 % (10/14 0700) Arterial Line BP: (90-159)/(37-65) 101/37 mmHg (10/14 0700) Weight:  [129 lb 3 oz (58.6 kg)] 129 lb 3 oz (58.6 kg) (10/14 0500)  Hemodynamic parameters for last 24 hours: PAP: (37-39)/(15-17) 37/15 mmHg  Intake/Output from previous day: 10/13 0701 - 10/14 0700 In: 1089.5 [I.V.:889.5; IV Piggyback:200] Out: 1415 [Urine:1375; Chest Tube:40] Intake/Output this shift: Total I/O In: 50 [IV Piggyback:50] Out: -   General appearance: alert, cooperative and no distress Neurologic: intact Heart: regular rate and rhythm and no murmur Lungs: diminished breath sounds bilaterally Abdomen: normal findings: soft, non-tender  Lab Results:  Recent Labs  10/11/15 1730 10/11/15 1733 10/12/15 0425  WBC 21.1*  --  21.6*  HGB 11.7* 12.2 11.6*  HCT 35.6* 36.0 34.8*  PLT 105*  --  88*   BMET:  Recent Labs  10/11/15 0400  10/11/15 1733 10/12/15 0425  NA 137  --  136 133*  K 4.3  --  4.2 3.4*  CL 108  --  103 102  CO2 24  --   --  23  GLUCOSE 110*  --  131* 115*  BUN 13  --  16 14  CREATININE 0.72  < > 0.70 0.74  CALCIUM 7.2*  --   --  6.9*  < > = values in this interval not displayed.  PT/INR:  Recent Labs  10/10/15 1345  LABPROT 19.7*  INR 1.67*   ABG    Component Value Date/Time   PHART 7.370 10/10/2015 1846   HCO3 25.7* 10/10/2015 1846   TCO2 19 10/11/2015 1733   ACIDBASEDEF 3.0* 10/10/2015 1728   O2SAT 89.0 10/10/2015 1846    CBG (last 3)   Recent Labs  10/11/15 1919 10/11/15 2319 10/12/15 0345  GLUCAP 102* 110* 109*    Assessment/Plan: S/P Procedure(s) (LRB): MITRAL VALVE REPAIR (MVR) WITH SIZE 28 CARPENTIER-EDWARDS PHYSIO II ANNULOPLASTY RING (N/A) TRANSESOPHAGEAL ECHOCARDIOGRAM (TEE) (N/A) POD # 2  CV- had transient atrial fib last night- now back in SR on amiodarone  Wean dopamine and neo as BP allows  RESP_ bibasilar atelectasis + small bilateral effusions  IS  RENAL- hypokalemia- correct. Continue diuresis  ENDO- CBG well controlled  Thrombocytopenia- dc enoxaparin, SCD for DVT prophylaxis  Nausea- "comes and goes", no emesis, continue reglan   LOS: 12 days    [name] 10/12/2015

## 2015-10-12 NOTE — Progress Notes (Signed)
CRITICAL VALUE ALERT  Critical value received:  cdif +  Date of notification:  10/12/15  Time of notification:  2327  Critical value read back:Yes.    Nurse who received alert:  Vista Lawman, RN  MD notified (1st page):  Dr. Roxan Hockey  Time of first page:  2335  Responding MD:  Dr. Roxan Hockey  Time MD responded:  2335

## 2015-10-12 NOTE — Care Management Important Message (Signed)
Important Message  Patient Details  Name: Amy Fischer MRN: [id number] Date of Birth: 01/06/1929   Medicare Important Message Given:  Yes-fourth notification given    Nathen May 10/12/2015, 10:17 AM

## 2015-10-12 NOTE — Progress Notes (Signed)
Patient currently in A-fib. Patient had episodes earlier in the shift that converted on its own. Patient's current vitals with the A-fib: HR 120-140s; BP 130-140/50s. Dr. Prescott Gum notified and new orders received. Patient currently sitting up in char. Will continue to monitor closely.

## 2015-10-12 NOTE — Progress Notes (Signed)
10/12/2015 6:35 PM Pt. Placed on enteric precautions per orders. Pt. And family educated on precautions. Verbalized understanding. Will continue to closely monitor patient.  Kaylem Gidney, Arville Lime

## 2015-10-12 NOTE — Anesthesia Postprocedure Evaluation (Signed)
  Anesthesia Post-op Note  Patient: Amy Fischer  Procedure(s) Performed: Procedure(s): MITRAL VALVE REPAIR (MVR) WITH SIZE 28 CARPENTIER-EDWARDS PHYSIO II ANNULOPLASTY RING (N/A) TRANSESOPHAGEAL ECHOCARDIOGRAM (TEE) (N/A)  Patient Location: PACU and SICU  Anesthesia Type:General  Level of Consciousness: Patient remains intubated per anesthesia plan  Airway and Oxygen Therapy: Patient remains intubated per anesthesia plan  Post-op Pain: mild  Post-op Assessment: Post-op Vital signs reviewed              Post-op Vital Signs: Reviewed  Last Vitals:  Filed Vitals:   10/12/15 1000  BP: 129/43  Pulse: 51  Temp:   Resp: 14    Complications: No apparent anesthesia complications

## 2015-10-12 NOTE — Progress Notes (Signed)
Pt has had multiple episode of loose stool this afternoon and tonight.  Pt states that she always gets diarrhea after receiving antibiotics and that she has taken imodium in the past for relief.  Dr. Prescott Gum notified of above, orders received.  Will continue to monitor patient.  Vista Lawman, RN

## 2015-10-12 NOTE — Progress Notes (Signed)
Dr. Roxan Hockey notified of continued low urine output (65cc since 1700).  Orders received.  Will continue to monitor.  Vista Lawman, RN

## 2015-10-12 NOTE — Progress Notes (Signed)
Patient Name: Amy Fischer Date of Encounter: 10/12/2015  Principal Problem:   Respiratory distress Active Problems:   Hypothyroidism   GERD   Hypertension, isolated systolic   Acute on chronic diastolic congestive heart failure, NYHA class 3 (HCC)   Paroxysmal atrial fibrillation (HCC)   Acute respiratory failure with hypoxia and hypercapnia (HCC)   Mitral regurgitation   Left hip pain   Acute congestive heart failure (HCC)   Severe mitral regurgitation   Shortness of breath   S/P MVR (mitral valve repair)    Primary Cardiologist: Dr. Stanford Breed Patient Profile: 81 y.o. female w/ PMH of HLD, Hypothyroidism, [name] who presented to [facility] ED on 09/30/2015 for shortness of breath. Found to have acute diastolic CHF likely secondary to severe MR. TEE on 10/03/15 showed flail P2 and ruptured chord. Underwent Mitral Valve Repair on 10/10/2015.  SUBJECTIVE: Not feeling well. Says she is sweating but her arms and legs feel cold. Denies any current pain. Did not experience any palpitations during her PAF episode overnight.  OBJECTIVE Filed Vitals:   10/12/15 0600 10/12/15 0615 10/12/15 0630 10/12/15 0700  BP: 125/73     Pulse: 125 132 101 65  Temp:      TempSrc:      Resp: 19 17 16 15   Height:      Weight:      SpO2: 98% 88% 96% 97%    Intake/Output Summary (Last 24 hours) at 10/12/15 0734 Last data filed at 10/12/15 0706  Gross per 24 hour  Intake 1139.46 ml  Output   1415 ml  Net -275.54 ml   Filed Weights   10/10/15 0419 10/11/15 0500 10/12/15 0500  Weight: 112 lb 4.8 oz (50.939 kg) 127 lb 3.3 oz (57.7 kg) 129 lb 3 oz (58.6 kg)    PHYSICAL EXAM General: Well developed, well nourished, female in no acute distress. Head: Normocephalic, atraumatic.  Neck: Supple without bruits, JVD mildly elevated. Lungs:  Resp regular and unlabored, Rales at bases bilaterally. No wheezing noted. Heart: RRR, S1, S2, no S3, S4, or murmur; no rub. Sternal  dressing in place. Abdomen: Soft, non-tender, non-distended with normoactive bowel sounds. No hepatomegaly. No rebound/guarding. No obvious abdominal masses. Extremities: No clubbing, cyanosis, or edema. Distal pedal pulses are 2+ bilaterally. Neuro: Alert and oriented X 3. Moves all extremities spontaneously. Psych: Normal affect.    LABS: CBC: Recent Labs  10/11/15 1730 10/11/15 1733 10/12/15 0425  WBC 21.1*  --  21.6*  HGB 11.7* 12.2 11.6*  HCT 35.6* 36.0 34.8*  MCV 93.4  --  94.3  PLT 105*  --  88*   INR: Recent Labs  10/10/15 1345  INR 1.66*   Basic Metabolic Panel: Recent Labs  10/11/15 0400 10/11/15 1730 10/11/15 1733 10/12/15 0425  NA 137  --  136 133*  K 4.3  --  4.2 3.4*  CL 108  --  103 102  CO2 24  --   --  23  GLUCOSE 110*  --  131* 115*  BUN 13  --  16 14  CREATININE 0.72 0.86 0.70 0.74  CALCIUM 7.2*  --   --  6.9*  MG 2.5* 2.3  --   --   No results for input(s): TROPIPOC in the last 72 hours. BNP:  B NATRIURETIC PEPTIDE  Date/Time Value Ref Range Status  09/30/2015 03:35 AM 460.6* 0.0 - 100.0 pg/mL Final   Hemoglobin A1C: Recent Labs  10/09/15 2332  HGBA1C  5.6   TELE: Atrial Fibrillation at 0300 with rate in 130's - 140's. Converted back to NSR with rate in 60's.        Radiology/Studies: Dg Chest Port 1 View: 10/11/2015  CLINICAL DATA:  Status post mitral valve repair EXAM: PORTABLE CHEST 1 VIEW COMPARISON:  October 10, 2015 FINDINGS: Endotracheal tube and nasogastric tube have been removed. Other tubes and catheters are unchanged in position. No pneumothorax. Underlying emphysematous change is stable. Small pleural effusions remain with patchy bibasilar atelectasis. There has been a significant decrease in interstitial edema compared to 1 day prior. Only trace edema remains. No new opacity. Heart is upper normal in size with pulmonary vascular within normal limits. Mitral valve replacement present. No adenopathy. Postoperative clips in the  medial left supraclavicular region noted. There is old trauma involving the lateral left clavicle, stable. IMPRESSION: Less interstitial edema compared to 1 day prior. No new opacity. No change in cardiac silhouette. No pneumothorax. Electronically Signed   By: Lowella Grip III M.D.   On: 10/11/2015 08:03   Dg Chest Port 1 View: 10/10/2015  CLINICAL DATA:  Status post mitral valve replacement EXAM: PORTABLE CHEST 1 VIEW COMPARISON:  October 09, 2015 FINDINGS: Endotracheal tube tip is 2.8 cm above the carina. Swan-Ganz catheter tip is in the main pulmonary outflow tract directed toward the right. Nasogastric tube tip and side port are in the stomach. There are bilateral chest tubes. Temporary pacemaker wires are attached to the right heart. Patient is status post mitral valve replacement. No pneumothorax. There is underlying emphysematous change, stable. There are small pleural effusions bilaterally with mild pulmonary edema. There is mild atelectasis in the right mid lung. No adenopathy. There old healed rib fractures on the left, stable. There is evidence of old trauma involving the lateral left clavicle, stable. There are surgical clips in the medial left supraclavicular region, stable. IMPRESSION: Tube and catheter positions as described without pneumothorax. Interstitial edema present. No airspace consolidation. Mild mid lung atelectasis on the right. Underlying emphysematous change, stable. Electronically Signed   By: Lowella Grip III M.D.   On: 10/10/2015 14:37     Current Medications:  . acetaminophen  1,000 mg Oral 4 times per day   Or  . acetaminophen (TYLENOL) oral liquid 160 mg/5 mL  1,000 mg Per Tube 4 times per day  . antiseptic oral rinse  7 mL Mouth Rinse BID  . aspirin EC  325 mg Oral Daily   Or  . aspirin  324 mg Per Tube Daily  . bisacodyl  10 mg Oral Daily   Or  . bisacodyl  10 mg Rectal Daily  . Chlorhexidine Gluconate Cloth  6 each Topical Daily  . docusate sodium   200 mg Oral Daily  . ezetimibe  10 mg Oral Daily  . furosemide  40 mg Intravenous BID  . insulin aspart  0-9 Units Subcutaneous TID WC  . levothyroxine  100 mcg Oral QAC breakfast  . metoCLOPramide (REGLAN) injection  10 mg Intravenous 4 times per day  . metoprolol tartrate  12.5 mg Oral BID   Or  . metoprolol tartrate  12.5 mg Per Tube BID  . multivitamin with minerals  1 tablet Oral Daily  . mupirocin ointment  1 application Nasal BID  . omega-3 acid ethyl esters  1 g Oral Daily  . pantoprazole  40 mg Oral Daily  . potassium chloride  10 mEq Intravenous Q1 Hr x 3  . potassium chloride  10 mEq  Intravenous Once  . rosuvastatin  2.5 mg Oral Daily  . sodium chloride  3 mL Intravenous Q12H  . vitamin E  400 Units Oral Daily   . sodium chloride Stopped (10/11/15 0900)  . sodium chloride    . sodium chloride 20 mL/hr at 10/10/15 1800  . amiodarone 60 mg/hr (10/12/15 0610)   Followed by  . amiodarone    . DOPamine 4 mcg/kg/min (10/12/15 0600)  . lactated ringers 10 mL/hr at 10/12/15 0500  . nitroGLYCERIN 0 mcg/min (10/10/15 1340)  . phenylephrine (NEO-SYNEPHRINE) Adult infusion 40 mcg/min (10/12/15 0655)    ASSESSMENT AND PLAN:  1. Acute Diastolic CHF - CHF secondary to Severe MR. EF preserved at 65-70%. TEE on 10/03/2015 showed flail P2 and ruptured chord with severe MR. Cath on 10/04/2015 showed nonobstructive CAD. Underwent mitral valve repair on 10/10/2015.  - will need to continue to monitor volume status. Currently +478mL this admission, however she is currently on Neo-Synephrine following her recent surgery. - Receiving Lasix 40mg  IV BID.  2. Severe MR, s/p repair - TEE on 10/03/2015 showed flail P2 and ruptured chord with severe MR - Had Mitral Valve Repair on 10/10/2015 with Annuloplasty ring. MR murmur has resolved. - per admitting team   3. Paroxysmal Atrial Fibrillation - In atrial fibrillation around 0300 on 10/12/2015 with rate in 130's - 140's. Started on  Amiodarone infusion and is now in NSR with rate in 60's. This patients CHA2DS2-VASc Score and unadjusted Ischemic Stroke Rate (% per year) is equal to 9.7 % stroke rate/year from a score of 6.  Above score calculated as 1 point each if present [CHF, HTN, DM, Vascular=MI/PAD/Aortic Plaque, Age if 65-74, or Female] Above score calculated as 2 points each if present [Age > 75, or Stroke/TIA/TE]  4. HLD - continue statin therapy  5. Leukocytosis - WBC trending upwards. 11.6 on 10/10/2015. 21.6 on 10/12/2015. - likely secondary to recent surgery and normal in the post-operative period.  - continue to monitor trend.   6. Hypokalemia - K+ 3.4 on 10/12/2015 - currently being replaced.  Arna Medici , PA-C 7:34 AM 10/12/2015 Pager: [phone]   Agree with note by Guinevere Scarlet  POD #2 MV repair and placement of Annuloplasty ring. Not feeling as good this AM. Had PAF now in NSR on IV Amio. VSS. On Dop and Neo still. Labs notable for WBC increased to 21 K and Plts decreased to 88K. Afeb. Pt c/o diaphoresis and nausea. 14# up since adm. CXR shows basilar atelect with small effusions. Getting IV lasix. Renal stable. Rx per TCTS.   Lorretta Harp, M.D., [place], [facility], [place] [address] [place], [place]  [address]  [phone] 10/12/2015 10:11 AM

## 2015-10-13 ENCOUNTER — Inpatient Hospital Stay (HOSPITAL_COMMUNITY): Payer: Medicare Other

## 2015-10-13 LAB — GLUCOSE, CAPILLARY
GLUCOSE-CAPILLARY: 130 mg/dL — AB (ref 65–99)
GLUCOSE-CAPILLARY: 104 mg/dL — AB (ref 65–99)
Glucose-Capillary: 106 mg/dL — ABNORMAL HIGH (ref 65–99)
Glucose-Capillary: 116 mg/dL — ABNORMAL HIGH (ref 65–99)

## 2015-10-13 LAB — TYPE AND SCREEN
ABO/RH(D): A POS
ANTIBODY SCREEN: POSITIVE
DAT, IGG: NEGATIVE
UNIT DIVISION: 0
UNIT DIVISION: 0
Unit division: 0
Unit division: 0
Unit division: 0
Unit division: 0

## 2015-10-13 LAB — COMPREHENSIVE METABOLIC PANEL
ALK PHOS: 50 U/L (ref 38–126)
ALT: 13 U/L — ABNORMAL LOW (ref 14–54)
ANION GAP: 7 (ref 5–15)
AST: 19 U/L (ref 15–41)
Albumin: 2.7 g/dL — ABNORMAL LOW (ref 3.5–5.0)
BILIRUBIN TOTAL: 0.7 mg/dL (ref 0.3–1.2)
BUN: 18 mg/dL (ref 6–20)
CALCIUM: 6.6 mg/dL — AB (ref 8.9–10.3)
CO2: 23 mmol/L (ref 22–32)
Chloride: 101 mmol/L (ref 101–111)
Creatinine, Ser: 0.69 mg/dL (ref 0.44–1.00)
GLUCOSE: 119 mg/dL — AB (ref 65–99)
POTASSIUM: 3.7 mmol/L (ref 3.5–5.1)
Sodium: 131 mmol/L — ABNORMAL LOW (ref 135–145)
TOTAL PROTEIN: 5 g/dL — AB (ref 6.5–8.1)

## 2015-10-13 LAB — CBC
HEMATOCRIT: 35 % — AB (ref 36.0–46.0)
Hemoglobin: 11.7 g/dL — ABNORMAL LOW (ref 12.0–15.0)
MCH: 31.3 pg (ref 26.0–34.0)
MCHC: 33.4 g/dL (ref 30.0–36.0)
MCV: 93.6 fL (ref 78.0–100.0)
Platelets: 96 10*3/uL — ABNORMAL LOW (ref 150–400)
RBC: 3.74 MIL/uL — ABNORMAL LOW (ref 3.87–5.11)
RDW: 15.4 % (ref 11.5–15.5)
WBC: 19.7 10*3/uL — ABNORMAL HIGH (ref 4.0–10.5)

## 2015-10-13 MED ORDER — PROMETHAZINE HCL 25 MG/ML IJ SOLN
6.2500 mg | Freq: Four times a day (QID) | INTRAMUSCULAR | Status: DC | PRN
  Administered 2015-10-13: 6.25 mg via INTRAVENOUS
  Filled 2015-10-13: qty 1

## 2015-10-13 MED ORDER — POTASSIUM CHLORIDE 10 MEQ/50ML IV SOLN
10.0000 meq | INTRAVENOUS | Status: AC
  Administered 2015-10-13 (×3): 10 meq via INTRAVENOUS

## 2015-10-13 MED ORDER — POTASSIUM CHLORIDE 10 MEQ/50ML IV SOLN
10.0000 meq | INTRAVENOUS | Status: DC
Start: 2015-10-13 — End: 2015-10-13
  Filled 2015-10-13: qty 50

## 2015-10-13 MED ORDER — ENOXAPARIN SODIUM 30 MG/0.3ML ~~LOC~~ SOLN
30.0000 mg | SUBCUTANEOUS | Status: DC
Start: 2015-10-13 — End: 2015-10-14
  Administered 2015-10-13: 30 mg via SUBCUTANEOUS
  Filled 2015-10-13 (×2): qty 0.3

## 2015-10-13 MED ORDER — FUROSEMIDE 10 MG/ML IJ SOLN
40.0000 mg | Freq: Every day | INTRAMUSCULAR | Status: DC
  Filled 2015-10-13: qty 4

## 2015-10-13 MED ORDER — POTASSIUM CHLORIDE IN NACL 20-0.45 MEQ/L-% IV SOLN
INTRAVENOUS | Status: DC
  Administered 2015-10-13: 12:00:00 via INTRAVENOUS
  Filled 2015-10-13 (×5): qty 1000

## 2015-10-13 NOTE — Progress Notes (Signed)
      [address]       [place] [address]             [phone]       Still having some diarrhea  BP 114/54 mmHg  Pulse 90  Temp(Src) 98 F (36.7 C) (Axillary)  Resp 22  Ht 5\' 2"  (1.575 m)  Wt 133 lb 9.6 oz (60.601 kg)  BMI 24.43 kg/m2  SpO2 100%   Intake/Output Summary (Last 24 hours) at 10/13/15 1946 Last data filed at 10/13/15 1800  Gross per 24 hour  Intake 3211.73 ml  Output   1595 ml  Net 1616.73 ml    On PO vanco for c diff  Jeray Shugart C. Roxan Hockey, MD [facility] [phone]

## 2015-10-13 NOTE — Progress Notes (Signed)
3 Days Post-Op Procedure(s) (LRB): MITRAL VALVE REPAIR (MVR) WITH SIZE 28 CARPENTIER-EDWARDS PHYSIO II ANNULOPLASTY RING (N/A) TRANSESOPHAGEAL ECHOCARDIOGRAM (TEE) (N/A) Subjective: Still has nausea but a little less No more diarrhea  Objective: Vital signs in last 24 hours: Temp:  [96 F (35.6 C)-97.7 F (36.5 C)] 97.3 F (36.3 C) (10/15 0700) Pulse Rate:  [49-90] 89 (10/15 0900) Cardiac Rhythm:  [-] Ventricular paced (10/15 0800) Resp:  [13-28] 15 (10/15 0900) BP: (108-143)/(46-90) 108/48 mmHg (10/15 0900) SpO2:  [92 %-100 %] 98 % (10/15 0900) Weight:  [133 lb 9.6 oz (60.601 kg)] 133 lb 9.6 oz (60.601 kg) (10/15 0400)  Hemodynamic parameters for last 24 hours:    Intake/Output from previous day: 10/14 0701 - 10/15 0700 In: 3109.9 [P.O.:150; I.V.:2809.9; IV Piggyback:150] Out: 496 [Urine:496] Intake/Output this shift: Total I/O In: 340.1 [I.V.:340.1] Out: 405 [Urine:405]  General appearance: alert, cooperative and no distress Neurologic: intact Heart: regular rate and rhythm Lungs: diminished breath sounds bibasilar Abdomen: soft nontender  Lab Results:  Recent Labs  10/12/15 0425  10/12/15 1704 10/13/15 0445  WBC 21.6*  --   --  19.7*  HGB 11.6*  < > 12.2 11.7*  HCT 34.8*  < > 36.0 35.0*  PLT 88*  --   --  96*  < > = values in this interval not displayed. BMET:  Recent Labs  10/12/15 2215 10/13/15 0445  NA 131* 131*  K 4.3 3.7  CL 100* 101  CO2 22 23  GLUCOSE 138* 119*  BUN 20 18  CREATININE 0.87 0.69  CALCIUM 6.8* 6.6*    PT/INR:  Recent Labs  10/10/15 1345  LABPROT 19.7*  INR 1.67*   ABG    Component Value Date/Time   PHART 7.370 10/10/2015 1846   HCO3 25.7* 10/10/2015 1846   TCO2 19 10/11/2015 1733   ACIDBASEDEF 3.0* 10/10/2015 1728   O2SAT 89.0 10/10/2015 1846   CBG (last 3)   Recent Labs  10/12/15 1710 10/12/15 2023 10/13/15 0837  GLUCAP 136* 130* 106*    Assessment/Plan: S/P Procedure(s) (LRB): MITRAL VALVE REPAIR  (MVR) WITH SIZE 28 CARPENTIER-EDWARDS PHYSIO II ANNULOPLASTY RING (N/A) TRANSESOPHAGEAL ECHOCARDIOGRAM (TEE) (N/A) -  CV- she is in sinus with a long PR  Will DDD pace for rate  Continue IV amiodarone for now  RESP- IS  RENAL- UOP has picked up this AM, creatinine OK  GI- + c difficile- started on PO vanco, diarrhea better but still has some nausea  Diet as tolerated  CBG well controlled  She looks much better today   LOS: 13 days    [name] 10/13/2015

## 2015-10-14 ENCOUNTER — Inpatient Hospital Stay (HOSPITAL_COMMUNITY): Payer: Medicare Other

## 2015-10-14 DIAGNOSIS — A047 Enterocolitis due to Clostridium difficile: Secondary | ICD-10-CM

## 2015-10-14 DIAGNOSIS — E539 Vitamin B deficiency, unspecified: Secondary | ICD-10-CM

## 2015-10-14 DIAGNOSIS — D6959 Other secondary thrombocytopenia: Secondary | ICD-10-CM

## 2015-10-14 DIAGNOSIS — D72829 Elevated white blood cell count, unspecified: Secondary | ICD-10-CM

## 2015-10-14 DIAGNOSIS — I4891 Unspecified atrial fibrillation: Secondary | ICD-10-CM

## 2015-10-14 LAB — COMPREHENSIVE METABOLIC PANEL
ALT: 11 U/L — AB (ref 14–54)
AST: 19 U/L (ref 15–41)
Albumin: 2.6 g/dL — ABNORMAL LOW (ref 3.5–5.0)
Alkaline Phosphatase: 52 U/L (ref 38–126)
Anion gap: 11 (ref 5–15)
BUN: 14 mg/dL (ref 6–20)
CHLORIDE: 102 mmol/L (ref 101–111)
CO2: 20 mmol/L — AB (ref 22–32)
CREATININE: 0.75 mg/dL (ref 0.44–1.00)
Calcium: 7 mg/dL — ABNORMAL LOW (ref 8.9–10.3)
GFR calc Af Amer: >60 mL/min (ref >60)
GFR calc non Af Amer: >60 mL/min (ref >60)
Glucose, Bld: 79 mg/dL (ref 65–99)
Potassium: 4.5 mmol/L (ref 3.5–5.1)
SODIUM: 133 mmol/L — AB (ref 135–145)
Total Bilirubin: 0.6 mg/dL (ref 0.3–1.2)
Total Protein: 5 g/dL — ABNORMAL LOW (ref 6.5–8.1)

## 2015-10-14 LAB — CBC
HCT: 34.9 % — ABNORMAL LOW (ref 36.0–46.0)
Hemoglobin: 11.9 g/dL — ABNORMAL LOW (ref 12.0–15.0)
MCH: 31.5 pg (ref 26.0–34.0)
MCHC: 34.1 g/dL (ref 30.0–36.0)
MCV: 92.3 fL (ref 78.0–100.0)
PLATELETS: 43 10*3/uL — AB (ref 150–400)
RBC: 3.78 MIL/uL — AB (ref 3.87–5.11)
RDW: 15.4 % (ref 11.5–15.5)
WBC: 14.8 10*3/uL — AB (ref 4.0–10.5)

## 2015-10-14 LAB — GLUCOSE, CAPILLARY
GLUCOSE-CAPILLARY: 77 mg/dL (ref 65–99)
GLUCOSE-CAPILLARY: 112 mg/dL — AB (ref 65–99)
GLUCOSE-CAPILLARY: 102 mg/dL — AB (ref 65–99)
Glucose-Capillary: 100 mg/dL — ABNORMAL HIGH (ref 65–99)
Glucose-Capillary: 123 mg/dL — ABNORMAL HIGH (ref 65–99)

## 2015-10-14 LAB — SAVE SMEAR

## 2015-10-14 MED ORDER — FOLIC ACID 1 MG PO TABS
1.0000 mg | ORAL_TABLET | Freq: Every day | ORAL | Status: DC
  Administered 2015-10-14 – 2015-10-22 (×8): 1 mg via ORAL
  Filled 2015-10-14 (×9): qty 1

## 2015-10-14 MED ORDER — METRONIDAZOLE 500 MG PO TABS
500.0000 mg | ORAL_TABLET | Freq: Three times a day (TID) | ORAL | Status: AC
  Administered 2015-10-14 – 2015-10-21 (×21): 500 mg via ORAL
  Filled 2015-10-14 (×24): qty 1

## 2015-10-14 MED ORDER — VITAMIN B-12 1000 MCG PO TABS
1000.0000 ug | ORAL_TABLET | Freq: Every day | ORAL | Status: DC
  Administered 2015-10-15 – 2015-10-22 (×7): 1000 ug via ORAL
  Filled 2015-10-14 (×8): qty 1

## 2015-10-14 NOTE — Progress Notes (Signed)
4 Days Post-Op Procedure(s) (LRB): MITRAL VALVE REPAIR (MVR) WITH SIZE 28 CARPENTIER-EDWARDS PHYSIO II ANNULOPLASTY RING (N/A) TRANSESOPHAGEAL ECHOCARDIOGRAM (TEE) (N/A) Subjective: Feels a little better this AM Asking for oatmeal Mild nausea, no abdominal pain  Objective: Vital signs in last 24 hours: Temp:  [97.4 F (36.3 C)-98 F (36.7 C)] 97.5 F (36.4 C) (10/16 0800) Pulse Rate:  [87-100] 88 (10/16 0800) Cardiac Rhythm:  [-] A-V Sequential paced (10/16 0800) Resp:  [15-29] 18 (10/16 0800) BP: (85-153)/(39-93) 98/54 mmHg (10/16 0800) SpO2:  [95 %-100 %] 99 % (10/16 0800) Weight:  [134 lb 4.2 oz (60.9 kg)] 134 lb 4.2 oz (60.9 kg) (10/16 0500)  Hemodynamic parameters for last 24 hours:    Intake/Output from previous day: 10/15 0701 - 10/16 0700 In: 2326.8 [P.O.:120; I.V.:2056.8; IV Piggyback:150] Out: 1810 [Urine:1810] Intake/Output this shift: Total I/O In: 121.7 [P.O.:30; I.V.:91.7] Out: 65 [Urine:65]  General appearance: alert, cooperative and no distress Neurologic: intact Heart: regular rate and rhythm and no murmur Lungs: diminished breath sounds bibasilar Abdomen: normal findings: soft, non-tender Extremities: no edema  Lab Results:  Recent Labs  10/13/15 0445 10/14/15 0530  WBC 19.7* 14.8*  HGB 11.7* 11.9*  HCT 35.0* 34.9*  PLT 96* 43*   BMET:  Recent Labs  10/13/15 0445 10/14/15 0530  NA 131* 133*  K 3.7 4.5  CL 101 102  CO2 23 20*  GLUCOSE 119* 79  BUN 18 14  CREATININE 0.69 0.75  CALCIUM 6.6* 7.0*    PT/INR: No results for input(s): LABPROT, INR in the last 72 hours. ABG    Component Value Date/Time   PHART 7.370 10/10/2015 1846   HCO3 25.7* 10/10/2015 1846   TCO2 19 10/11/2015 1733   ACIDBASEDEF 3.0* 10/10/2015 1728   O2SAT 89.0 10/10/2015 1846   CBG (last 3)   Recent Labs  10/13/15 1719 10/13/15 2149 10/14/15 0755  GLUCAP 100* 116* 77    Assessment/Plan: S/P Procedure(s) (LRB): MITRAL VALVE REPAIR (MVR) WITH SIZE 28  CARPENTIER-EDWARDS PHYSIO II ANNULOPLASTY RING (N/A) TRANSESOPHAGEAL ECHOCARDIOGRAM (TEE) (N/A) -  Continued slow progress mainly due to c difficile colitis  CV- she is in SR with PACs under pacer, continue pacing for now  On amiodarone and low dose lopressor  Will likely need anticoagulation  HEME- thrombocytopenia worse today- stopped lovenox  Will check HIT panel  Hematology consult- ? Need for bivalrudin  RESP- IS for bibasilar atelectasis-   RENAL- lytes and creatinine OK, will hold off on any additional diuresis for now  GI- c difficile colitis- on PO vancomycin, WBC decreasing, will try advancing diet  ENDO- CBG well controlled   LOS: 14 days    [name] 10/14/2015

## 2015-10-14 NOTE — Progress Notes (Signed)
[name] CONSULT NOTE  Patient Care Team: Burnis Medin, MD as PCP - General Newton Pigg, MD (Obstetrics and Gynecology) Paralee Cancel, MD (Orthopedic Surgery) Jacelyn Pi, MD as Attending Physician (Endocrinology) Lafayette Dragon, MD as Consulting Physician (Gastroenterology) Everitt Amber, MD as Consulting Physician (Ophthalmology)  CHIEF COMPLAINTS/PURPOSE OF CONSULTATION:  Acute thrombocytopenia after recent valvular heart surgery, with coexisting diagnosis of C. difficile colitis and atrial fibrillation  HISTORY OF PRESENTING ILLNESS:  Amy Fischer 81 y.o. female is seen because of thrombocytopenia.  She was found to have abnormal CBC from blood draw monitoring after recent heart surgery. This patient has significant history of hypertension, carotid artery disease status post carotid-subclavian bypass graft and other medical comorbidities, was admitted when she was found to be hypoxemic. She had extensive evaluation and was subsequently found to have pulmonary hypertension and mitral valve regurgitation. The patient also has new onset of atrial fibrillation and elevated troponin from demand ischemia. He was weaned off BiPAP and was subsequently referred for surgery. After extensive evaluation, she underwent mitral valve repair on 10/10/2015. During surgery, he have cardioplegia and total cross-clamp time was 82 minutes. The patient was already in heart block and epicardial pacing wire was placed Her baseline platelet count prior to surgery on 10/10/2015 was 276,000. After surgery, her platelet count dropped to 88,000 on 10/12/2015. Postoperatively, hemoglobin dropped to as low as 6.8 and she received blood transfusion. She was also noted to have incidental leukocytosis and diarrhea postoperatively. Preoperative stool tests for C. difficile was negative on 10/03/2015. On 10/12/2015, repeat stool culture is now positive for C. difficile. The patient was placed on  isolation and was prescribed oral vancomycin. She is also placed on amiodarone postoperatively for atrial fibrillation. Her platelet count continues to trend downwards to a platelet count as low as 43,000. I was consulted by cardiothoracic surgery because of concern for heparin-induced thrombocytopenia.  She denies recent bruising/bleeding, such as spontaneous epistaxis, hematuria, melena or hematochezia There were no reported signs of tissue necrosis or ischemia According to the patient, her diarrhea is less She still had persistent shortness of breath but denies any chest pain. MEDICAL HISTORY:  Past Medical History  Diagnosis Date  . GERD (gastroesophageal reflux disease)   . Hyperlipidemia   . Hypothyroidism   . Osteoarthritis   . Osteoporosis   . Episodic recurrent vertigo     MRI Head 2003  . Allergy   . Bladder polyps   . Subclavian steal syndrome     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; CTA 2013 revealed open graft  . Hyperplastic colon polyp   . Esophageal spasm   . History of hepatitis     unknown type  . Asymptomatic carotid artery stenosis     R ICA 40% stenosis on CTA 12/2011.  [name] Anemia   . Cataract     BILATERAL-REMOVED  . Elevated blood pressure 03/25/2011    Bp readings borderline today has hx of elvation  In office and ok at home   Not checke recently   She gfeels was elevated from anxiety    . Diverticulosis   . Intestinal metaplasia of gastric mucosa   . Hepatic hemangioma     SURGICAL HISTORY: Past Surgical History  Procedure Laterality Date  . Appendectomy    . Cholecystectomy    . Carotid-subclavian bypass graft Left 1999    for [place] steal syndrome  . Rotator cuff repair Left   . Elbow surgery Left   .  Cystectomy Left     hand  . Tubal ligation    . Tonsillectomy    . Colonoscopy    . Tear duct probing  07/2014  . Knee surgery Left 2014  . Tee without cardioversion N/A 10/03/2015    Procedure: TRANSESOPHAGEAL ECHOCARDIOGRAM (TEE);   Surgeon: Larey Dresser, MD;  Location: [facility];  Service: Cardiovascular;  Laterality: N/A;  . Cardiac catheterization N/A 10/04/2015    Procedure: Right/Left Heart Cath and Coronary Angiography;  Surgeon: Belva Crome, MD;  Location: [facility];  Service: Cardiovascular;  Laterality: N/A;  . Mitral valve repair N/A 10/10/2015    Procedure: MITRAL VALVE REPAIR (MVR) WITH SIZE 28 CARPENTIER-EDWARDS PHYSIO II ANNULOPLASTY RING;  Surgeon: [name], MD;  Location: [facility];  Service: Open Heart Surgery;  Laterality: N/A;  . Tee without cardioversion N/A 10/10/2015    Procedure: TRANSESOPHAGEAL ECHOCARDIOGRAM (TEE);  Surgeon: [name], MD;  Location: [facility];  Service: Open Heart Surgery;  Laterality: N/A;    SOCIAL HISTORY: Social History   Social History  . Marital Status: Widowed    Spouse Name: N/A  . Number of Children: 4  . Years of Education: N/A   Occupational History  . retired    Social History Main Topics  . Smoking status: Former Smoker    Quit date: 01/05/1991  . Smokeless tobacco: Never Used  . Alcohol Use: 2.4 oz/week    4 Glasses of wine per week     Comment: socially  . Drug Use: No  . Sexual Activity: Not on file   Other Topics Concern  . Not on file   Social History Narrative   Widowed   HH of 1    No pets   Former smoker   Exercises regularly    FAMILY HISTORY: Family History  Problem Relation Age of Onset  . Hypertension Mother   . Heart disease Father   . Stroke Mother   . Colon cancer Neg Hx     ALLERGIES:  is allergic to codeine; risedronate sodium; statins; and augmentin.  MEDICATIONS:  Current Facility-Administered Medications  Medication Dose Route Frequency Provider Last Rate Last Dose  . 0.45 % NaCl with KCl 20 mEq / L infusion   Intravenous Continuous [name], MD 75 mL/hr at 10/14/15 0400    . acetaminophen (TYLENOL) tablet 1,000 mg  1,000 mg Oral 4 times per day Coolidge Breeze, PA-C    1,000 mg at 10/13/15 1155   Or  . acetaminophen (TYLENOL) solution 1,000 mg  1,000 mg Per Tube 4 times per day Coolidge Breeze, PA-C      . amiodarone (NEXTERONE PREMIX) 360 MG/200ML (1.8 mg/mL) IV infusion  30 mg/hr Intravenous Continuous Ivin Poot, MD 16.7 mL/hr at 10/14/15 0912 30 mg/hr at 10/14/15 0912  . antiseptic oral rinse (CPC / CETYLPYRIDINIUM CHLORIDE 0.05%) solution 7 mL  7 mL Mouth Rinse BID [name], MD   7 mL at 10/12/15 2200  . aspirin EC tablet 81 mg  81 mg Oral Daily [name], MD   81 mg at 10/14/15 1100   Or  . aspirin chewable tablet 81 mg  81 mg Per Tube Daily [name], MD      . bisacodyl (DULCOLAX) EC tablet 10 mg  10 mg Oral Daily PRN [name], MD       Or  . bisacodyl (DULCOLAX) suppository 10 mg  10 mg Rectal Daily  PRN [name], MD      . Chlorhexidine Gluconate Cloth 2 % PADS 6 each  6 each Topical Daily [name], MD   6 each at 10/13/15 1000  . DOPamine (INTROPIN) 800 mg in dextrose 5 % 250 mL (3.2 mg/mL) infusion  3 mcg/kg/min Intravenous Titrated [name], MD   Stopped at 10/13/15 1700  . ezetimibe (ZETIA) tablet 10 mg  10 mg Oral Daily Belva Crome, MD   10 mg at 10/14/15 1100  . fentaNYL (SUBLIMAZE) injection 50-100 mcg  50-100 mcg Intravenous Q1H PRN Coolidge Breeze, PA-C   50 mcg at 10/11/15 0854  . folic acid (FOLVITE) tablet 1 mg  1 mg Oral Daily Joyann Spidle, MD      . insulin aspart (novoLOG) injection 0-9 Units  0-9 Units Subcutaneous TID WC [name], MD   1 Units at 10/12/15 1719  . lactated ringers infusion   Intravenous Continuous Coolidge Breeze, PA-C   Stopped at 10/12/15 1718  . levothyroxine (SYNTHROID, LEVOTHROID) tablet 100 mcg  100 mcg Oral QAC breakfast Bonnielee Haff, MD   100 mcg at 10/13/15 0900  . loperamide (IMODIUM) capsule 4 mg  4 mg Oral Q6H PRN Ivin Poot, MD   4 mg at 10/12/15 2131  . metoprolol (LOPRESSOR) injection 2.5-5 mg  2.5-5 mg  Intravenous Q2H PRN Coolidge Breeze, PA-C      . metoprolol tartrate (LOPRESSOR) tablet 12.5 mg  12.5 mg Oral BID Coolidge Breeze, PA-C   12.5 mg at 10/10/15 1745   Or  . metoprolol tartrate (LOPRESSOR) 25 mg/10 mL oral suspension 12.5 mg  12.5 mg Per Tube BID Coolidge Breeze, PA-C      . multivitamin with minerals tablet 1 tablet  1 tablet Oral Daily Belva Crome, MD   1 tablet at 10/14/15 1100  . mupirocin ointment (BACTROBAN) 2 % 1 application  1 application Nasal BID [name], MD   1 application at 60/04/59 1100  . nitroGLYCERIN 50 mg in dextrose 5 % 250 mL (0.2 mg/mL) infusion  0-100 mcg/min Intravenous Titrated Coolidge Breeze, PA-C 0 mL/hr at 10/10/15 1340 0 mcg/min at 10/10/15 1340  . omega-3 acid ethyl esters (LOVAZA) capsule 1 g  1 g Oral Daily Belva Crome, MD   1 g at 10/14/15 1100  . ondansetron (ZOFRAN) injection 4 mg  4 mg Intravenous Q6H PRN Coolidge Breeze, PA-C   4 mg at 10/14/15 0752  . pantoprazole (PROTONIX) EC tablet 40 mg  40 mg Oral Daily Coolidge Breeze, PA-C   40 mg at 10/14/15 1100  . phenylephrine (NEO-SYNEPHRINE) 20 mg in dextrose 5 % 250 mL (0.08 mg/mL) infusion  0-100 mcg/min Intravenous Titrated Coolidge Breeze, PA-C   Stopped at 10/13/15 1300  . polyvinyl alcohol (LIQUIFILM TEARS) 1.4 % ophthalmic solution   Both Eyes TID PRN Belva Crome, MD      . rosuvastatin (CRESTOR) tablet 2.5 mg  2.5 mg Oral Daily Reyne Dumas, MD   2.5 mg at 10/14/15 1100  . sodium chloride (OCEAN) 0.65 % nasal spray 1 spray  1 spray Each Nare Daily PRN Belva Crome, MD      . sodium chloride 0.9 % injection 3 mL  3 mL Intravenous Q12H Gina L Collins, PA-C   3 mL at 10/14/15 1100  . sodium chloride 0.9 % injection 3 mL  3 mL Intravenous PRN Coolidge Breeze, PA-C      .  traMADol (ULTRAM) tablet 50-100 mg  50-100 mg Oral Q4H PRN Coolidge Breeze, PA-C   100 mg at 10/12/15 0751  . vancomycin (VANCOCIN) 50 mg/mL oral solution 125 mg  125 mg Oral TID AC & HS [name], MD   125 mg  at 10/14/15 1147  . [START ON 10/15/2015] vitamin B-12 (CYANOCOBALAMIN) tablet 1,000 mcg  1,000 mcg Oral Daily Heath Lark, MD      . vitamin E capsule 400 Units  400 Units Oral Daily Belva Crome, MD   400 Units at 10/14/15 1100    REVIEW OF SYSTEMS:   Constitutional: Denies fevers, chills or abnormal night sweats Eyes: Denies blurriness of vision, double vision or watery eyes Ears, nose, mouth, throat, and face: Denies mucositis or sore throat Gastrointestinal:  Denies nausea, heartburn or change in bowel habits Skin: Denies abnormal skin rashes Lymphatics: Denies new lymphadenopathy or easy bruising Neurological:Denies numbness, tingling or new weaknesses Behavioral/Psych: Mood is stable, no new changes  All other systems were reviewed with the patient and are negative.  PHYSICAL EXAMINATION: ECOG PERFORMANCE STATUS: 2 - Symptomatic, <50% confined to bed  Filed Vitals:   10/14/15 0800  BP: 98/54  Pulse: 88  Temp: 97.5 F (36.4 C)  Resp: 18   Filed Weights   10/12/15 0500 10/13/15 0400 10/14/15 0500  Weight: 129 lb 3 oz (58.6 kg) 133 lb 9.6 oz (60.601 kg) 134 lb 4.2 oz (60.9 kg)    GENERAL:alert, no distress and comfortable. She is not in any acute distress SKIN: I do not see any evidence of skin necrosis or digital ischemia. No rashes or petechiae  EYES: normal, conjunctiva are pink and non-injected, sclera clear OROPHARYNX:no exudate, no erythema and lips, buccal mucosa, and tongue normal  NECK: supple, thyroid normal size, non-tender, without nodularity LYMPH:  no palpable lymphadenopathy in the cervical, axillary or inguinal LUNGS: Normal breathing effort. Reduced breath sounds on both lung bases  HEART: regular rate & rhythm and no murmurs and no lower extremity edema ABDOMEN:abdomen soft, non-tender and normal bowel sounds Musculoskeletal:no cyanosis of digits and no clubbing  PSYCH: alert & oriented x 3 with fluent speech NEURO: no focal motor/sensory  deficits  LABORATORY DATA:  I have reviewed the data as listed Lab Results  Component Value Date   WBC 14.8* 10/14/2015   HGB 11.9* 10/14/2015   HCT 34.9* 10/14/2015   MCV 92.3 10/14/2015   PLT 43* 10/14/2015   Lab Results  Component Value Date   CREATININE 0.75 10/14/2015   Lab Results  Component Value Date   ALT 11* 10/14/2015   AST 19 10/14/2015   ALKPHOS 52 10/14/2015   BILITOT 0.6 10/14/2015     RADIOGRAPHIC STUDIES: I have personally reviewed the radiological images as listed and agreed with the findings in the report. Dg Chest 2 View  10/09/2015  CLINICAL DATA:  81 year old female preoperative evaluation for mitral valve repair. Shortness of Breath. Initial encounter. EXAM: CHEST  2 VIEW COMPARISON:  Chest CTA 09/30/2015 and earlier. FINDINGS: Semi upright AP and lateral views of the chest. Regressed interstitial pulmonary opacity bilaterally. Small bilateral pleural effusions appear stable since 09/30/2015. Stable cardiac size and mediastinal contours. No pneumothorax. Chronic left rib fractures. Calcified aortic atherosclerosis. Osteopenia. Stable visualized osseous structures. Left neck and right upper quadrant surgical clips. IMPRESSION: 1. Regressed pulmonary edema. Small bilateral pleural effusions appear stable since 09/30/2015. 2. No new cardiopulmonary abnormality. Electronically Signed   By: Genevie Ann M.D.   On: 10/09/2015  23:17   Ct Angio Chest Pe W/cm &/or Wo Cm  09/30/2015  CLINICAL DATA:  Shortness of breath and decreased oxygen saturation EXAM: CT ANGIOGRAPHY CHEST WITH CONTRAST TECHNIQUE: Multidetector CT imaging of the chest was performed using the standard protocol during bolus administration of intravenous contrast. Multiplanar CT image reconstructions and MIPs were obtained to evaluate the vascular anatomy. CONTRAST:  58mL OMNIPAQUE IOHEXOL 350 MG/ML SOLN COMPARISON:  Chest radiograph September 30, 2015 FINDINGS: There is no demonstrable pulmonary embolus. There  is atherosclerotic change in the thoracic aorta without aneurysm. There is atherosclerotic change in the visualized great vessels without high-grade obstruction appreciable. There are small pleural effusions bilaterally. There is underlying centrilobular emphysematous change. There is interstitial and patchy alveolar edema bilaterally. There is reflux of contrast into the inferior vena cava and hepatic veins. Heart is slightly enlarged. There is no appreciable pericardial effusion. There are scattered foci of coronary artery calcification. The thyroid is diminutive. There is no appreciable thoracic adenopathy. Small mediastinal lymph nodes are present which do not meet size criteria for pathologic significance. In the visualized upper abdomen, gallbladder is absent. There is atherosclerotic change in the visualized upper abdominal aorta. There are no blastic or lytic bone lesions. There is degenerative change in the thoracic spine. Review of the MIP images confirms the above findings. IMPRESSION: No demonstrable pulmonary embolus. Evidence of congestive heart failure superimposed on emphysematous change. No demonstrable adenopathy. Scattered foci of coronary artery calcification noted. Reflux of contrast into the inferior vena cava and hepatic veins is indicative of increased right heart pressure. Electronically Signed   By: Lowella Grip III M.D.   On: 09/30/2015 08:24   Dg Chest Port 1 View  10/14/2015  CLINICAL DATA:  10-10-15 s/p MVR EXAM: PORTABLE CHEST 1 VIEW COMPARISON:  1 day prior FINDINGS: Right IJ Cordis sheath remains. Prior median sternotomy. Mitral valve repair. Mild cardiomegaly with transverse aortic atherosclerosis. Layering small bilateral pleural effusions. No pneumothorax. Mild pulmonary venous congestion remains. Bibasilar airspace disease. IMPRESSION: No significant change since one day prior. Cardiomegaly with mild pulmonary venous congestion, bilateral pleural effusions, and adjacent  airspace disease. Electronically Signed   By: Abigail Miyamoto M.D.   On: 10/14/2015 08:56   Dg Chest Port 1 View  10/13/2015  CLINICAL DATA:  Status post mitral valve repair. EXAM: PORTABLE CHEST 1 VIEW COMPARISON:  1 day prior FINDINGS: Right internal jugular Cordis sheath terminates at the confluence of the brachiocephalic veins. Median sternotomy and mitral valve repair. Cardiomegaly accentuated by AP portable technique. Atherosclerosis in the transverse aorta. Small bilateral pleural effusions. No pneumothorax. Mild pulmonary venous congestion. Bibasilar airspace disease. IMPRESSION: No significant change since one day prior. Pulmonary venous congestion with bilateral pleural effusions and adjacent airspace disease, most likely atelectasis. Electronically Signed   By: Abigail Miyamoto M.D.   On: 10/13/2015 10:29   Dg Chest Port 1 View  10/12/2015  CLINICAL DATA:  Status post mitral valve repair. EXAM: PORTABLE CHEST 1 VIEW COMPARISON:  October 11, 2015. FINDINGS: Stable cardiomediastinal silhouette. Sternotomy wires are noted. No pneumothorax is noted. Increased bilateral pleural effusions are noted with probable underlying atelectasis or edema. Right internal jugular venous sheath is noted. Mild central pulmonary vascular congestion is noted with possible perihilar edema. Old left posterior rib fracture is noted. IMPRESSION: Increased bilateral pleural effusions are noted with associated atelectasis or edema. Mild central pulmonary vascular congestion and perihilar edema is noted as well. Electronically Signed   By: Marijo Conception, M.D.  On: 10/12/2015 08:06   Dg Chest Port 1 View  10/11/2015  CLINICAL DATA:  Status post mitral valve repair EXAM: PORTABLE CHEST 1 VIEW COMPARISON:  October 10, 2015 FINDINGS: Endotracheal tube and nasogastric tube have been removed. Other tubes and catheters are unchanged in position. No pneumothorax. Underlying emphysematous change is stable. Small pleural effusions  remain with patchy bibasilar atelectasis. There has been a significant decrease in interstitial edema compared to 1 day prior. Only trace edema remains. No new opacity. Heart is upper normal in size with pulmonary vascular within normal limits. Mitral valve replacement present. No adenopathy. Postoperative clips in the medial left supraclavicular region noted. There is old trauma involving the lateral left clavicle, stable. IMPRESSION: Less interstitial edema compared to 1 day prior. No new opacity. No change in cardiac silhouette. No pneumothorax. Electronically Signed   By: Lowella Grip III M.D.   On: 10/11/2015 08:03   Dg Chest Port 1 View  10/10/2015  CLINICAL DATA:  Status post mitral valve replacement EXAM: PORTABLE CHEST 1 VIEW COMPARISON:  October 09, 2015 FINDINGS: Endotracheal tube tip is 2.8 cm above the carina. Swan-Ganz catheter tip is in the main pulmonary outflow tract directed toward the right. Nasogastric tube tip and side port are in the stomach. There are bilateral chest tubes. Temporary pacemaker wires are attached to the right heart. Patient is status post mitral valve replacement. No pneumothorax. There is underlying emphysematous change, stable. There are small pleural effusions bilaterally with mild pulmonary edema. There is mild atelectasis in the right mid lung. No adenopathy. There old healed rib fractures on the left, stable. There is evidence of old trauma involving the lateral left clavicle, stable. There are surgical clips in the medial left supraclavicular region, stable. IMPRESSION: Tube and catheter positions as described without pneumothorax. Interstitial edema present. No airspace consolidation. Mild mid lung atelectasis on the right. Underlying emphysematous change, stable. Electronically Signed   By: Lowella Grip III M.D.   On: 10/10/2015 14:37   Dg Chest Port 1 View  09/30/2015  CLINICAL DATA:  Shortness of breath. EXAM: PORTABLE CHEST 1 VIEW COMPARISON:   05/14/2015 FINDINGS: Diffuse interstitial opacity with Kerley lines. Symmetric basilar airspace disease. Stable borderline cardiomegaly and aortic tortuosity. Background hyperinflation, likely COPD. No effusion or air leak. IMPRESSION: CHF superimposed on COPD. Electronically Signed   By: Monte Fantasia M.D.   On: 09/30/2015 04:55   Dg Hip Unilat With Pelvis 2-3 Views Left  10/02/2015  CLINICAL DATA:  Acute left hip pain without known injury. EXAM: DG HIP (WITH OR WITHOUT PELVIS) 2-3V LEFT COMPARISON:  December 07, 2014. FINDINGS: There is no evidence of hip fracture or dislocation. No significant degenerative changes seen involving either hip joint. There does appear to be old healed fracture involving the left inferior pubic ramus. IMPRESSION: Old healed fracture involving the left inferior pubic ramus. Hip joints appear normal. Electronically Signed   By: Marijo Conception, M.D.   On: 10/02/2015 18:28   Peripheral smear is reviewed. Absolute thrombocytopenia without platelet clumping is seen. Noted occasional large platelets. Noted occasional rare schistocyte but not a prominent feature. Polychromasia is seen.  ASSESSMENT & PLAN  Acute thrombocytopenia status post recent valvular heart surgery I suspect the cause of the acute thrombocytopenia was related to recent cardioplegia from heart surgery, consumption from surgery, recent C. difficile diarrhea, reduced production from mild bone marrow suppression from recent surgery, as well as background history of borderline vitamin B-12 deficiency My suspicion for heparin-induced cytopenia  is low. Heparin products had been discontinued. There were no evidence of skin necrosis and I have several good explanation for cause of thrombocytopenia Pretest probability is low I have discussed this with primary service. I felt that starting her on empiric direct thrombin inhibitor may not be necessary. This could be associated with increased risk of bleeding  with low platelet count I recommend close monitoring of platelet count. I recommend switching medication to a different kind for treatment of C. difficile colitis as vancomycin is known to cause profound thrombocytopenia I will also proceed to start her on vitamin E-36 and folic acid supplement to assist in platelet production If the patient have signs of bleeding, I recommend platelet transfusion HIT panel is pending  Leukocytosis with newly diagnosed C. difficile colitis The patient was started on oral vancomycin with improvement of leukocytosis With permission from primary service, I will switch her to metronidazole  Atrial fibrillation on amiodarone and pacing Certainly amiodarone can cause thrombocytopenia as well. This may need to be on long-term treatment to control her atrial fibrillation The patient may benefit from chronic anticoagulation therapy in the future to prevent risk of stroke. The patient lives alone and I'm concerned about adherence to warfarin therapy. Certainly, starting her NOAC would be desirable without the need for chronic anticoagulation therapy I would recommend consideration to start her on Eliquis once her platelet count is consistently over 50,000  Recent anemia from acute blood loss status post blood transfusion This is stable and she is not symptomatic. Continue close monitoring  Protein calorie malnutrition The patient had a decent lunch today. Consider dietary supplement  History of borderline vitamin B-12 deficiency I will recheck vitamin B-12 level tomorrow I will proceed to start her on vitamin B-12 supplement tomorrow  Will follow.

## 2015-10-14 NOTE — Progress Notes (Signed)
      [address]       [place], [address]             [phone]       Appreciate Dr. Gorsuch's assistance  Mrs. Huyett feels better this evening.  She was able to eat a little bit of everything on her plate at lunch  BP 01/56 mmHg  Pulse 88  Temp(Src) 97.5 F (36.4 C) (Oral)  Resp 18  Ht 5\' 2"  (1.575 m)  Wt 134 lb 4.2 oz (60.9 kg)  BMI 24.55 kg/m2  SpO2 99%   Intake/Output Summary (Last 24 hours) at 10/14/15 1712 Last data filed at 10/14/15 0800  Gross per 24 hour  Intake 1313.8 ml  Output    675 ml  Net  638.8 ml    Slowly improving.  Changed from vanco to flagyl. Will hold off on anticoagulation for now.  Revonda Standard Roxan Hockey, MD [facility] [phone]

## 2015-10-15 ENCOUNTER — Encounter (HOSPITAL_COMMUNITY): Payer: Self-pay | Admitting: *Deleted

## 2015-10-15 ENCOUNTER — Inpatient Hospital Stay (HOSPITAL_COMMUNITY): Payer: Medicare Other

## 2015-10-15 LAB — COMPREHENSIVE METABOLIC PANEL
ALK PHOS: 46 U/L (ref 38–126)
ALT: 11 U/L — AB (ref 14–54)
ANION GAP: 9 (ref 5–15)
AST: 15 U/L (ref 15–41)
Albumin: 2.5 g/dL — ABNORMAL LOW (ref 3.5–5.0)
BILIRUBIN TOTAL: 0.5 mg/dL (ref 0.3–1.2)
BUN: 11 mg/dL (ref 6–20)
CALCIUM: 7.8 mg/dL — AB (ref 8.9–10.3)
CO2: 20 mmol/L — AB (ref 22–32)
CREATININE: 0.66 mg/dL (ref 0.44–1.00)
Chloride: 106 mmol/L (ref 101–111)
GFR calc non Af Amer: >60 mL/min (ref >60)
GLUCOSE: 101 mg/dL — AB (ref 65–99)
Potassium: 4.1 mmol/L (ref 3.5–5.1)
SODIUM: 135 mmol/L (ref 135–145)
TOTAL PROTEIN: 4.5 g/dL — AB (ref 6.5–8.1)

## 2015-10-15 LAB — HEPARIN INDUCED PLATELET AB (HIT ANTIBODY): Heparin Induced Plt Ab: 0.458 OD — ABNORMAL HIGH (ref 0.000–0.400)

## 2015-10-15 LAB — GLUCOSE, CAPILLARY
GLUCOSE-CAPILLARY: 99 mg/dL (ref 65–99)
Glucose-Capillary: 100 mg/dL — ABNORMAL HIGH (ref 65–99)
Glucose-Capillary: 121 mg/dL — ABNORMAL HIGH (ref 65–99)
Glucose-Capillary: 140 mg/dL — ABNORMAL HIGH (ref 65–99)

## 2015-10-15 LAB — CBC
HCT: 32.4 % — ABNORMAL LOW (ref 36.0–46.0)
HEMOGLOBIN: 11 g/dL — AB (ref 12.0–15.0)
MCH: 31.4 pg (ref 26.0–34.0)
MCHC: 34 g/dL (ref 30.0–36.0)
MCV: 92.6 fL (ref 78.0–100.0)
PLATELETS: 122 10*3/uL — AB (ref 150–400)
RBC: 3.5 MIL/uL — AB (ref 3.87–5.11)
RDW: 15.1 % (ref 11.5–15.5)
WBC: 9.7 10*3/uL (ref 4.0–10.5)

## 2015-10-15 LAB — VITAMIN B12: VITAMIN B 12: 297 pg/mL (ref 180–914)

## 2015-10-15 MED ORDER — AMIODARONE HCL 200 MG PO TABS
200.0000 mg | ORAL_TABLET | Freq: Two times a day (BID) | ORAL | Status: DC
  Administered 2015-10-15 – 2015-10-22 (×15): 200 mg via ORAL
  Filled 2015-10-15 (×17): qty 1

## 2015-10-15 MED ORDER — FUROSEMIDE 10 MG/ML IJ SOLN
20.0000 mg | Freq: Once | INTRAMUSCULAR | Status: AC
  Administered 2015-10-15: 20 mg via INTRAVENOUS
  Filled 2015-10-15: qty 2

## 2015-10-15 MED ORDER — GERHARDT'S BUTT CREAM
TOPICAL_CREAM | Freq: Three times a day (TID) | CUTANEOUS | Status: DC | PRN
  Administered 2015-10-16: 13:00:00 via TOPICAL
  Filled 2015-10-15: qty 1

## 2015-10-15 MED ORDER — FUROSEMIDE 40 MG PO TABS
40.0000 mg | ORAL_TABLET | Freq: Every day | ORAL | Status: DC
  Filled 2015-10-15: qty 1

## 2015-10-15 NOTE — Progress Notes (Addendum)
EVENING ROUNDS NOTE :     [address]       [place] [id number]             [phone]                 5 Days Post-Op Procedure(s) (LRB): MITRAL VALVE REPAIR (MVR) WITH SIZE 28 CARPENTIER-EDWARDS PHYSIO II ANNULOPLASTY RING (N/A) TRANSESOPHAGEAL ECHOCARDIOGRAM (TEE) (N/A)  Total Length of Stay:  LOS: 15 days  BP 122/49 mmHg  Pulse 69  Temp(Src) 97.7 F (36.5 C) (Oral)  Resp 17  Ht 5\' 2"  (1.575 m)  Wt 138 lb 10.7 oz (62.9 kg)  BMI 25.36 kg/m2  SpO2 99%  .Intake/Output      10/17 0701 - 10/18 0700   P.O.    I.V. (mL/kg) 240.5 (3.8)   Total Intake(mL/kg) 240.5 (3.8)   Urine (mL/kg/hr) 515 (0.6)   Stool 0 (0)   Total Output 515   Net -274.5       Urine Occurrence 1 x   Stool Occurrence 1 x     . 0.45 % NaCl with KCl 20 mEq / L Stopped (10/15/15 1300)  . lactated ringers Stopped (10/12/15 1718)  . nitroGLYCERIN 0 mcg/min (10/10/15 1340)  . phenylephrine (NEO-SYNEPHRINE) Adult infusion Stopped (10/13/15 1300)     Lab Results  Component Value Date   WBC 9.7 10/15/2015   HGB 11.0* 10/15/2015   HCT 32.4* 10/15/2015   PLT 122* 10/15/2015   GLUCOSE 101* 10/15/2015   CHOL 163 07/19/2014   TRIG 130.0 07/19/2014   HDL 63.00 07/19/2014   LDLCALC 74 07/19/2014   ALT 11* 10/15/2015   AST 15 10/15/2015   NA 135 10/15/2015   K 4.1 10/15/2015   CL 106 10/15/2015   CREATININE 0.66 10/15/2015   BUN 11 10/15/2015   CO2 20* 10/15/2015   TSH 0.586 09/30/2015   INR 1.67* 10/10/2015   HGBA1C 5.6 10/09/2015    Had some poor uop but has improved with lasix Otherwise remains stable    10/15/2015 9:35 PM  GOLD,WAYNE E, PA-C   Stable tonight I have seen and examined [name] and agree with the above assessment  and plan.  Grace Isaac MD [facility] [phone] Office [phone] 10/16/2015 12:04 AM

## 2015-10-15 NOTE — Progress Notes (Signed)
Amy Fischer   DOB:[birth year]   ZO#:[id number]   JWJ#:[id number]  Patient Care Team: Burnis Medin, MD as PCP - General Newton Pigg, MD (Obstetrics and Gynecology) Paralee Cancel, MD (Orthopedic Surgery) Jacelyn Pi, MD as Attending Physician (Endocrinology) Lafayette Dragon, MD as Consulting Physician (Gastroenterology) Everitt Amber, MD as Consulting Physician (Ophthalmology)  I have seen the patient, examined her and edited the notes as follows  Subjective: Patient seen and examined. She is still feeling weak. She denies fevers, chills, night sweats, vision changes, or mucositis. She reports dyspnea on exertion, but not tachypnea or orthopnea. She denies any productive cough. Denies any chest pain or palpitations. Denies lower extremity swelling, but does report puffy edema on her upper extremities. Denies nausea, heartburn. Her diarrhea improved, less frequent. She reports 2 bowel movements since last night.. Appetite is decreased. Denies any dysuria. Denies abnormal skin rashes, or neuropathy. Denies any bleeding issues such as epistaxis, hematemesis, hematuria or hematochezia. No confusion is reported.   Scheduled Meds: . acetaminophen  1,000 mg Oral 4 times per day   Or  . acetaminophen (TYLENOL) oral liquid 160 mg/5 mL  1,000 mg Per Tube 4 times per day  . amiodarone  200 mg Oral BID  . antiseptic oral rinse  7 mL Mouth Rinse BID  . aspirin EC  81 mg Oral Daily   Or  . aspirin  81 mg Per Tube Daily  . ezetimibe  10 mg Oral Daily  . folic acid  1 mg Oral Daily  . [START ON 10/16/2015] furosemide  40 mg Oral Daily  . insulin aspart  0-9 Units Subcutaneous TID WC  . levothyroxine  100 mcg Oral QAC breakfast  . metoprolol tartrate  12.5 mg Oral BID   Or  . metoprolol tartrate  12.5 mg Per Tube BID  . metroNIDAZOLE  500 mg Oral 3 times per day  . multivitamin with minerals  1 tablet Oral Daily  . mupirocin ointment  1 application Nasal BID  . omega-3 acid ethyl esters  1 g Oral  Daily  . pantoprazole  40 mg Oral Daily  . rosuvastatin  2.5 mg Oral Daily  . sodium chloride  3 mL Intravenous Q12H  . vitamin B-12  1,000 mcg Oral Daily  . vitamin E  400 Units Oral Daily   Continuous Infusions: . 0.45 % NaCl with KCl 20 mEq / L 10 mL/hr (10/15/15 1030)  . amiodarone 30 mg/hr (10/15/15 0900)  . lactated ringers Stopped (10/12/15 1718)  . nitroGLYCERIN 0 mcg/min (10/10/15 1340)  . phenylephrine (NEO-SYNEPHRINE) Adult infusion Stopped (10/13/15 1300)   PRN Meds:.bisacodyl **OR** bisacodyl, fentaNYL (SUBLIMAZE) injection, loperamide, metoprolol, ondansetron (ZOFRAN) IV, polyvinyl alcohol, sodium chloride, sodium chloride, traMADol  Objective:  Filed Vitals:   10/15/15 1000  BP: 133/59  Pulse: 69  Temp:   Resp: 23      Intake/Output Summary (Last 24 hours) at 10/15/15 1113 Last data filed at 10/15/15 1000  Gross per 24 hour  Intake 2348.7 ml  Output    970 ml  Net 1378.7 ml    ECOG PERFORMANCE STATUS: 2  GENERAL:alert, no distress and comfortable. She is not in any acute distress SKIN: No evidence of skin necrosis or digital ischemia. No rashes or petechiae  EYES: normal, conjunctiva are pink and non-injected, sclera clear OROPHARYNX:no exudate, no erythema and lips, buccal mucosa, and tongue normal  NECK: supple, thyroid normal size, non-tender, without nodularity LYMPH: no palpable lymphadenopathy in the cervical, axillary or inguinal  LUNGS: Normal breathing effort. Reduced breath sounds on both lung bases  HEART: regular rate & rhythm with occasional ectopic beat,and no murmurs and 1+ lower extremity edema ABDOMEN:abdomen soft, non-tender and active bowel sounds Musculoskeletal:no cyanosis of digits and no clubbing  PSYCH: alert & oriented x 3 with fluent speech NEURO: no focal motor/sensory deficits    CBG (last 3)   Recent Labs  10/14/15 1753 10/14/15 2141 10/15/15 0746  GLUCAP 102* 123* 99     Labs:   Recent Labs Lab  10/11/15 1730  10/12/15 0425 10/12/15 1636 10/12/15 1704 10/13/15 0445 10/14/15 0530 10/15/15 0424  WBC 21.1*  --  21.6*  --   --  19.7* 14.8* 9.7  HGB 11.7*  < > 11.6* 12.9 12.2 11.7* 11.9* 11.0*  HCT 35.6*  < > 34.8* 38.0 36.0 35.0* 34.9* 32.4*  PLT 105*  --  88*  --   --  96* 43* 122*  MCV 93.4  --  94.3  --   --  93.6 92.3 92.6  MCH 30.7  --  31.4  --   --  31.3 31.5 31.4  MCHC 32.9  --  33.3  --   --  33.4 34.1 34.0  RDW 15.7*  --  15.6*  --   --  15.4 15.4 15.1  < > = values in this interval not displayed.   Chemistries:    Recent Labs Lab 10/10/15 1940  10/11/15 0400 10/11/15 1730  10/12/15 0425  10/12/15 1704 10/12/15 2215 10/13/15 0445 10/14/15 0530 10/15/15 0424  NA  --   < > 137  --   < > 133*  < > 127* 131* 131* 133* 135  K  --   < > 4.3  --   < > 3.4*  < > 4.2 4.3 3.7 4.5 4.1  CL  --   < > 108  --   < > 102  --   --  100* 101 102 106  CO2  --   --  24  --   --  23  --   --  22 23 20* 20*  GLUCOSE  --   < > 110*  --   < > 115*  < > 365* 138* 119* 79 101*  BUN  --   < > 13  --   < > 14  --   --  20 18 14 11   CREATININE 0.69  < > 0.72 0.86  < > 0.74  --   --  0.87 0.69 0.75 0.66  CALCIUM  --   --  7.2*  --   --  6.9*  --   --  6.8* 6.6* 7.0* 7.8*  MG 3.0*  --  2.5* 2.3  --   --   --   --   --   --   --   --   AST  --   --   --   --   --   --   --   --   --  19 19 15   ALT  --   --   --   --   --   --   --   --   --  13* 11* 11*  ALKPHOS  --   --   --   --   --   --   --   --   --  50 52 46  BILITOT  --   --   --   --   --   --   --   --   --  0.7 0.6 0.5  < > = values in this interval not displayed.  GFR Estimated Creatinine Clearance: 45.6 mL/min (by C-G formula based on Cr of 0.66).  Liver Function Tests:  Recent Labs Lab 10/13/15 0445 10/14/15 0530 10/15/15 0424  AST 19 19 15   ALT 13* 11* 11*  ALKPHOS 50 52 46  BILITOT 0.7 0.6 0.5  PROT 5.0* 5.0* 4.5*  ALBUMIN 2.7* 2.6* 2.5*   No results for input(s): LIPASE, AMYLASE in the last 168  hours. No results for input(s): AMMONIA in the last 168 hours.  Urine Studies     Component Value Date/Time   COLORURINE AMBER* 10/10/2015 0420   APPEARANCEUR CLEAR 10/10/2015 0420   LABSPEC 1.026 10/10/2015 0420   PHURINE 6.0 10/10/2015 0420   GLUCOSEU NEGATIVE 10/10/2015 0420   HGBUR NEGATIVE 10/10/2015 0420   BILIRUBINUR NEGATIVE 10/10/2015 0420   BILIRUBINUR neg 04/27/2012 1120   KETONESUR NEGATIVE 10/10/2015 0420   PROTEINUR NEGATIVE 10/10/2015 0420   PROTEINUR neg 04/27/2012 1120   UROBILINOGEN 0.2 10/10/2015 0420   UROBILINOGEN 0.2 04/27/2012 1120   NITRITE NEGATIVE 10/10/2015 0420   NITRITE neg 04/27/2012 1120   LEUKOCYTESUR SMALL* 10/10/2015 0420    Coagulation profile  Recent Labs Lab 10/10/15 1345  INR 1.67*   CBG:  Recent Labs Lab 10/14/15 0755 10/14/15 1114 10/14/15 1753 10/14/15 2141 10/15/15 0746  GLUCAP 77 112* 102* 123* 99      Imaging Studies:  Dg Chest Port 1 View  10/15/2015  CLINICAL DATA:  81 year old female status post mitral valve repair EXAM: PORTABLE CHEST 1 VIEW COMPARISON:  Prior chest x-ray 10/14/2015 FINDINGS: Stable right IJ approach vascular sheath. The tip projects over the innominate vein. Patient is status post median sternotomy with evidence of a mitral valve replacement. Stable enlargement of the cardiopericardial silhouette. Atherosclerotic calcifications again noted in the transverse aorta. There is no evidence of pneumothorax. Bilateral layering pleural effusions and associated bibasilar atelectasis may be slightly improved. Mild vascular congestion without pulmonary edema. Calcified granuloma in the left upper lung again noted. Surgical changes of prior partial left clavicular resection. IMPRESSION: 1. Slightly decreased bilateral layering pleural effusions and associated bibasilar atelectasis. 2. Mild vascular congestion without edema. 3. Unchanged position of right IJ vascular sheath. Electronically Signed   By: Jacqulynn Cadet M.D.   On: 10/15/2015 07:41     Assessment/Plan: 81 y.o.   Acute thrombocytopenia status post recent valvular heart surgery, improving Acute thrombocytopenia is likely related to recent cardioplegia from heart surgery, consumption from surgery, recent C. difficile diarrhea, reduced production from mild bone marrow suppression from recent surgery, as well as background history of borderline vitamin B-12 deficiency Suspicion for heparin-induced cytopenia is low. HIT panel is pending Pretest probability is low Heparin products had been discontinued. There were no evidence of skin necrosis  Recommend close monitoring of platelet count. Current count is 122k Vancomycin was discontinued  for treatment of C. difficile colitis as this is known to cause profound thrombocytopenia. She was placed on Flagyl. Vitamin V-69 and folic acid supplement  were initiated on 10/17 to assist in platelet production She does not need to be on empiric direct thrombin inhibitor   Leukocytosis with newly diagnosed C. difficile colitis, resolved The patient was started on oral vancomycin with improvement of leukocytosis With permission from primary service, she was switched to metronidazole Counts have improved, today at 9.7 Will monitor, continue treatment for total 7 days of therapy  Atrial fibrillation on amiodarone and pacing, resolved now with  normal sinus rhythm Certainly amiodarone can cause thrombocytopenia as well. She is currently on normal sinus rhythm Her most recent atrial fibrillation could be triggered by recent stress from surgery and infection I would defer to primary service in terms of need for long term anticoagulation She can potentially be maintained on aspirin alone; If long term anticoagulation is needed, Eliquis would be a good choice Would recommend consideration to start her on Eliquis once her platelet count is consistently over 50,000  Recent anemia from acute blood loss  status post blood transfusion This is stable and she is not symptomatic. Continue close monitoring  Protein calorie malnutrition The patient had a decent lunch today. Consider dietary supplement  History of borderline vitamin B-12 deficiency  B-12 level is 297 B-12 supplement was initiated on 10/17  Full Code  Will sign off. Call if questions arise  Other medical issues as per admitting team     [facility] E, PA-C 10/15/2015  11:13 AM Amy Melander, MD 10/15/2015

## 2015-10-15 NOTE — Progress Notes (Signed)
5 Days Post-Op Procedure(s) (LRB): MITRAL VALVE REPAIR (MVR) WITH SIZE 28 CARPENTIER-EDWARDS PHYSIO II ANNULOPLASTY RING (N/A) TRANSESOPHAGEAL ECHOCARDIOGRAM (TEE) (N/A) Subjective: No nausea Feels like she can't get a deep breath  Objective: Vital signs in last 24 hours: Temp:  [95.7 F (35.4 C)-97.7 F (36.5 C)] 97.2 F (36.2 C) (10/17 0751) Pulse Rate:  [80-110] 80 (10/17 0912) Cardiac Rhythm:  [-] A-V Sequential paced (10/17 0800) Resp:  [16-30] 18 (10/17 0900) BP: (100-174)/(36-120) 124/47 mmHg (10/17 0912) SpO2:  [95 %-100 %] 97 % (10/17 0900) Weight:  [138 lb 10.7 oz (62.9 kg)] 138 lb 10.7 oz (62.9 kg) (10/17 0500)  Hemodynamic parameters for last 24 hours:    Intake/Output from previous day: 10/16 0701 - 10/17 0700 In: 2334.1 [P.O.:150; I.V.:2184.1] Out: 915 [Urine:915] Intake/Output this shift: Total I/O In: 186.4 [I.V.:186.4] Out: 125 [Urine:125]  General appearance: alert, cooperative and no distress Neurologic: intact Heart: slightly irregular Lungs: diminished breath sounds bibasilar Abdomen: normal findings: soft, non-tender Extremities: edema 1+ Wound: clean and dry  Lab Results:  Recent Labs  10/14/15 0530 10/15/15 0424  WBC 14.8* 9.7  HGB 11.9* 11.0*  HCT 34.9* 32.4*  PLT 43* 122*   BMET:  Recent Labs  10/14/15 0530 10/15/15 0424  NA 133* 135  K 4.5 4.1  CL 102 106  CO2 20* 20*  GLUCOSE 79 101*  BUN 14 11  CREATININE 0.75 0.66  CALCIUM 7.0* 7.8*    PT/INR: No results for input(s): LABPROT, INR in the last 72 hours. ABG    Component Value Date/Time   PHART 7.370 10/10/2015 1846   HCO3 25.7* 10/10/2015 1846   TCO2 19 10/11/2015 1733   ACIDBASEDEF 3.0* 10/10/2015 1728   O2SAT 89.0 10/10/2015 1846   CBG (last 3)   Recent Labs  10/14/15 1753 10/14/15 2141 10/15/15 0746  GLUCAP 102* 123* 99    Assessment/Plan: S/P Procedure(s) (LRB): MITRAL VALVE REPAIR (MVR) WITH SIZE 28 CARPENTIER-EDWARDS PHYSIO II ANNULOPLASTY RING  (N/A) TRANSESOPHAGEAL ECHOCARDIOGRAM (TEE) (N/A) She continues to make progress  CV- in SR with PACs- change amiodarone to PO  RESP- IS for atelectasis. Also small effusions- start lasix tomorrow  RENAL- creatinine and lytes ok, IV to KVO  GI- c diff colitis- on Flagyl now, symptoms improving  LEUKOCYTOSIS- secondary to c diff- resolved  Thrombocytopenia- improving  Deconditioning- mobilize as tolerated, PT consult  Dc foley and central line   LOS: 15 days    Amy Fischer 10/15/2015

## 2015-10-16 LAB — CBC
HCT: 34.5 % — ABNORMAL LOW (ref 36.0–46.0)
HEMOGLOBIN: 11.6 g/dL — AB (ref 12.0–15.0)
MCH: 30.9 pg (ref 26.0–34.0)
MCHC: 33.6 g/dL (ref 30.0–36.0)
MCV: 91.8 fL (ref 78.0–100.0)
Platelets: 152 10*3/uL (ref 150–400)
RBC: 3.76 MIL/uL — AB (ref 3.87–5.11)
RDW: 15.1 % (ref 11.5–15.5)
WBC: 12.5 10*3/uL — AB (ref 4.0–10.5)

## 2015-10-16 LAB — BASIC METABOLIC PANEL
Anion gap: 8 (ref 5–15)
BUN: 14 mg/dL (ref 6–20)
CHLORIDE: 105 mmol/L (ref 101–111)
CO2: 22 mmol/L (ref 22–32)
CREATININE: 0.81 mg/dL (ref 0.44–1.00)
Calcium: 8.5 mg/dL — ABNORMAL LOW (ref 8.9–10.3)
GFR calc Af Amer: >60 mL/min (ref >60)
GFR calc non Af Amer: >60 mL/min (ref >60)
GLUCOSE: 95 mg/dL (ref 65–99)
Potassium: 3.8 mmol/L (ref 3.5–5.1)
SODIUM: 135 mmol/L (ref 135–145)

## 2015-10-16 LAB — GLUCOSE, CAPILLARY
GLUCOSE-CAPILLARY: 107 mg/dL — AB (ref 65–99)
GLUCOSE-CAPILLARY: 110 mg/dL — AB (ref 65–99)

## 2015-10-16 MED ORDER — ACETAMINOPHEN 325 MG PO TABS
650.0000 mg | ORAL_TABLET | Freq: Four times a day (QID) | ORAL | Status: DC | PRN
  Administered 2015-10-18 – 2015-10-21 (×5): 650 mg via ORAL
  Filled 2015-10-16 (×4): qty 2

## 2015-10-16 MED ORDER — SODIUM CHLORIDE 0.9 % IJ SOLN
3.0000 mL | Freq: Two times a day (BID) | INTRAMUSCULAR | Status: DC
  Administered 2015-10-16 – 2015-10-21 (×11): 3 mL via INTRAVENOUS

## 2015-10-16 MED ORDER — FUROSEMIDE 40 MG PO TABS
40.0000 mg | ORAL_TABLET | Freq: Two times a day (BID) | ORAL | Status: DC
  Administered 2015-10-16 – 2015-10-20 (×9): 40 mg via ORAL
  Filled 2015-10-16 (×9): qty 1

## 2015-10-16 MED ORDER — ALPRAZOLAM 0.25 MG PO TABS: 0.2500 mg | ORAL_TABLET | Freq: Four times a day (QID) | ORAL | Status: DC | PRN

## 2015-10-16 MED ORDER — MOVING RIGHT ALONG BOOK
Freq: Once | Status: AC
  Administered 2015-10-16: 17:00:00
  Filled 2015-10-16: qty 1

## 2015-10-16 MED ORDER — APIXABAN 2.5 MG PO TABS
2.5000 mg | ORAL_TABLET | Freq: Two times a day (BID) | ORAL | Status: DC
  Administered 2015-10-17 – 2015-10-22 (×11): 2.5 mg via ORAL
  Filled 2015-10-16 (×11): qty 1

## 2015-10-16 MED ORDER — SODIUM CHLORIDE 0.9 % IV SOLN: 250.0000 mL | INTRAVENOUS | Status: DC | PRN

## 2015-10-16 MED ORDER — MELATONIN 3 MG PO TABS
3.0000 mg | ORAL_TABLET | Freq: Every evening | ORAL | Status: DC | PRN
  Filled 2015-10-16: qty 1

## 2015-10-16 MED ORDER — SODIUM CHLORIDE 0.9 % IJ SOLN: 3.0000 mL | INTRAMUSCULAR | Status: DC | PRN

## 2015-10-16 MED ORDER — POTASSIUM CHLORIDE CRYS ER 20 MEQ PO TBCR
20.0000 meq | EXTENDED_RELEASE_TABLET | Freq: Two times a day (BID) | ORAL | Status: DC
  Administered 2015-10-16 – 2015-10-22 (×13): 20 meq via ORAL
  Filled 2015-10-16 (×13): qty 1

## 2015-10-16 NOTE — Care Management Note (Signed)
Case Management Note  Patient Details  Name: Amy Fischer MRN: [id number] Date of Birth: Apr 30, 1929  Subjective/Objective:         Initially talked with patient but she did not feel well, nauseated.  States she lives at home alone, has children, but they all work so she thought she would just go to facility, short term.   Talked with daughter today, who is a Marine scientist and she plans to be off with her Mom, is working on [employer] paperwork now.  Will continue to follow.           Action/Plan:   Expected Discharge Date:  10/03/15               Expected Discharge Plan:  Home/Self Care  In-House Referral:     Discharge planning Services  CM Consult  Post Acute Care Choice:    Choice offered to:     DME Arranged:    DME Agency:     HH Arranged:    HH Agency:     Status of Service:  In process, will continue to follow  Medicare Important Message Given:  Yes-fourth notification given Date Medicare IM Given:    Medicare IM give by:    Date Additional Medicare IM Given:    Additional Medicare Important Message give by:     If discussed at [place] of Stay Meetings, dates discussed:    Additional Comments: PT recommending SNF - Lets see how patient progresses.  Plan is for patient to go home with daughter who is a Marine scientist.  Vergie Living, RN 10/16/2015, 3:16 PM

## 2015-10-16 NOTE — Clinical Documentation Improvement (Signed)
Oncology  Can the diagnosis of Malnutrition be further specified?   Document Severity - Severe(third degree), Moderate (second degree), Mild (first degree)  Other condition  Unable to clinically determine  Supporting Information: :  BMI= 21.9 10/14/15 and 10/15/15 progress notes: "Protein calorie malnutrition"  Please exercise your independent, professional judgment when responding. A specific answer is not anticipated or expected.   Thank You, [name], RN, [place] [phone]

## 2015-10-16 NOTE — Progress Notes (Signed)
Patient sitting up in chair, no distress or pain.  Son is present at bedside, call light within reach.

## 2015-10-16 NOTE — Clinical Documentation Improvement (Signed)
Cardiothoracic  Abnormal Lab/Test Results:  10/12/15: Na= 133, 131; 10/13/15: Na= 131; 10/14/15: Na= 133  Possible Clinical Conditions associated with below indicators  Hyponatremia  Other Condition  Cannot Clinically Determine  Please exercise your independent, professional judgment when responding. A specific answer is not anticipated or expected.  Thank You,  [name], RN, [place] [phone]

## 2015-10-16 NOTE — Progress Notes (Signed)
6 Days Post-Op Procedure(s) (LRB): MITRAL VALVE REPAIR (MVR) WITH SIZE 28 CARPENTIER-EDWARDS PHYSIO II ANNULOPLASTY RING (N/A) TRANSESOPHAGEAL ECHOCARDIOGRAM (TEE) (N/A) Subjective: C/o swelling and cramps in legs One BM yesterday  Objective: Vital signs in last 24 hours: Temp:  [96.3 F (35.7 C)-97.8 F (36.6 C)] 97.1 F (36.2 C) (10/18 0700) Pulse Rate:  [56-81] 67 (10/18 0800) Cardiac Rhythm:  [-] Normal sinus rhythm (10/18 0800) Resp:  [12-25] 19 (10/18 0800) BP: (89-147)/(39-68) 127/48 mmHg (10/18 0800) SpO2:  [91 %-100 %] 98 % (10/18 0800) Weight:  [137 lb 3.2 oz (62.234 kg)] 137 lb 3.2 oz (62.234 kg) (10/18 0432)  Hemodynamic parameters for last 24 hours:    Intake/Output from previous day: 10/17 0701 - 10/18 0700 In: 270.5 [P.O.:30; I.V.:240.5] Out: 1015 [Urine:1015] Intake/Output this shift:    General appearance: alert, cooperative and no distress Neurologic: intact Heart: regular rate and rhythm Lungs: diminished breath sounds bibasilar Abdomen: normal findings: soft, non-tender Wound: clean and dry 2 + pitting edema in LE Lab Results:  Recent Labs  10/15/15 0424 10/16/15 0404  WBC 9.7 12.5*  HGB 11.0* 11.6*  HCT 32.4* 34.5*  PLT 122* 152   BMET:  Recent Labs  10/15/15 0424 10/16/15 0404  NA 135 135  K 4.1 3.8  CL 106 105  CO2 20* 22  GLUCOSE 101* 95  BUN 11 14  CREATININE 0.66 0.81  CALCIUM 7.8* 8.5*    PT/INR: No results for input(s): LABPROT, INR in the last 72 hours. ABG    Component Value Date/Time   PHART 7.370 10/10/2015 1846   HCO3 25.7* 10/10/2015 1846   TCO2 19 10/11/2015 1733   ACIDBASEDEF 3.0* 10/10/2015 1728   O2SAT 89.0 10/10/2015 1846   CBG (last 3)   Recent Labs  10/15/15 1212 10/15/15 1634 10/15/15 2148  GLUCAP 100* 121* 140*    Assessment/Plan: S/P Procedure(s) (LRB): MITRAL VALVE REPAIR (MVR) WITH SIZE 28 CARPENTIER-EDWARDS PHYSIO II ANNULOPLASTY RING (N/A) TRANSESOPHAGEAL ECHOCARDIOGRAM (TEE)  (N/A) Plan for transfer to step-down: see transfer orders   Continues to improve  CV- in SR on PO lopressor and amiodarone  On baby aspirin  Will start Eliquis tomorrow  RESP- bilateral effusions- diurese  RENAL- creatinine and lytes Ok- volume overloaded, diurese  ENDO_ CBG well controlled, dc SSI  Thrombocytopenia- resolved, initial heparin Ab screen +, serotonin release assay pending  No heparin, added to list of allergies  Deconditioning, continue to mobilize   LOS: 16 days    Amy Fischer 10/16/2015

## 2015-10-16 NOTE — Progress Notes (Signed)
Pt transferred to [address] with all medications and personal belongs. Pt placed in bedside chair and given the phone to call her daughter.

## 2015-10-16 NOTE — Progress Notes (Signed)
Physical Therapy Treatment Patient Details Name: ITA FRITZSCHE MRN: [id number] DOB: [birth year] Today's Date: 10/16/2015    History of Present Illness 81 y.o. female with PMH of osteoporosis, OA, Hypothyroidism, [name] who presented to [facility] ED on 09/30/2015 for shortness of breath. Found to have acute diastolic CHF due to MR found by TEE.  S/P MVR repair 10/13.    PT Comments    Progressing well.  Gait still guarded with SOB and EHR in 110-120's bpm range  Follow Up Recommendations  SNF;Supervision/Assistance - 24 hour     Equipment Recommendations  None recommended by PT    Recommendations for Other Services       Precautions / Restrictions Precautions Precautions: Fall Restrictions Weight Bearing Restrictions: No    Mobility  Bed Mobility               General bed mobility comments: In chair on arrival  Transfers Overall transfer level: Needs assistance   Transfers: Sit to/from Stand Sit to Stand: Min assist         General transfer comment: cues for sternal precautions, assist to come forward  Ambulation/Gait Ambulation/Gait assistance: Min assist Ambulation Distance (Feet): 300 Feet (with 2 standing rests.) Assistive device:  (pushing IV pole) Gait Pattern/deviations: Step-through pattern Gait velocity: slower   General Gait Details: smaller, guarded steps, mildly flexed trunk.  cues for postural correction.  EHR fluctuating in110's to 120's bpm with max 133 bpm.  SpO2 at 97% on 2L.   Stairs            Wheelchair Mobility    Modified Rankin (Stroke Patients Only)       Balance     Sitting balance-Leahy Scale: Good       Standing balance-Leahy Scale: Fair                      Cognition Arousal/Alertness: Awake/alert Behavior During Therapy: WFL for tasks assessed/performed Overall Cognitive Status: Within Functional Limits for tasks assessed                      Exercises       General Comments        Pertinent Vitals/Pain Pain Assessment: Faces Faces Pain Scale: Hurts little more Pain Location: sternal Pain Descriptors / Indicators: Grimacing Pain Intervention(s): Monitored during session    Home Living                      Prior Function            PT Goals (current goals can now be found in the care plan section) Acute Rehab PT Goals Patient Stated Goal: return home and play bridge PT Goal Formulation: With patient Time For Goal Achievement: 10/23/15 Potential to Achieve Goals: Good Progress towards PT goals: Progressing toward goals    Frequency  Min 3X/week    PT Plan Current plan remains appropriate    Co-evaluation             End of Session Equipment Utilized During Treatment: Oxygen Activity Tolerance: Patient tolerated treatment well Patient left: in chair;with call bell/phone within reach     Time: 1007-1045 PT Time Calculation (min) (ACUTE ONLY): 38 min  Charges:  $Gait Training: 8-22 mins $Therapeutic Activity: 8-22 mins                    G Codes:      Debbra Digiulio,  Tessie Fass 10/16/2015, 11:04 AM  10/16/2015  Donnella Sham, [place] [phone]  (pager)

## 2015-10-16 NOTE — Progress Notes (Signed)
Report provided to 2 [facility].

## 2015-10-17 ENCOUNTER — Inpatient Hospital Stay (HOSPITAL_COMMUNITY): Payer: Medicare Other

## 2015-10-17 LAB — BASIC METABOLIC PANEL
Anion gap: 12 (ref 5–15)
BUN: 11 mg/dL (ref 6–20)
CALCIUM: 8.8 mg/dL — AB (ref 8.9–10.3)
CO2: 24 mmol/L (ref 22–32)
CREATININE: 0.92 mg/dL (ref 0.44–1.00)
Chloride: 102 mmol/L (ref 101–111)
GFR calc non Af Amer: 56 mL/min — ABNORMAL LOW (ref >60)
Glucose, Bld: 101 mg/dL — ABNORMAL HIGH (ref 65–99)
Potassium: 4 mmol/L (ref 3.5–5.1)
SODIUM: 138 mmol/L (ref 135–145)

## 2015-10-17 LAB — CBC
HCT: 36.5 % (ref 36.0–46.0)
Hemoglobin: 11.9 g/dL — ABNORMAL LOW (ref 12.0–15.0)
MCH: 30.4 pg (ref 26.0–34.0)
MCHC: 32.6 g/dL (ref 30.0–36.0)
MCV: 93.4 fL (ref 78.0–100.0)
Platelets: 166 10*3/uL (ref 150–400)
RBC: 3.91 MIL/uL (ref 3.87–5.11)
RDW: 15.2 % (ref 11.5–15.5)
WBC: 13.8 10*3/uL — ABNORMAL HIGH (ref 4.0–10.5)

## 2015-10-17 NOTE — Progress Notes (Signed)
Patient sitting in chair, no distress or pain. Call light within reach

## 2015-10-17 NOTE — Progress Notes (Signed)
Physical Therapy Treatment Patient Details Name: IRVIN LIZAMA MRN: [id number] DOB: August 08, 1929 Today's Date: 10/17/2015    History of Present Illness 81 y.o. female with PMH of osteoporosis, OA, Hypothyroidism, [name] who presented to [facility] ED on 09/30/2015 for shortness of breath. Found to have acute diastolic CHF due to MR found by TEE.  S/P MVR repair 10/13.    PT Comments    Improving steadily.  Gait more steady until fatigue and then pt needed wandering and sidestepping to maintain her stability.  Vitals good, Little DOE.  Follow Up Recommendations  Home health PT     Equipment Recommendations  None recommended by PT    Recommendations for Other Services       Precautions / Restrictions Precautions Precautions: Fall    Mobility  Bed Mobility                  Transfers     Transfers: Sit to/from Stand Sit to Stand: Min assist         General transfer comment: cues for sternal precautions, assist to come forward  Ambulation/Gait Ambulation/Gait assistance: Min assist;Min guard Ambulation Distance (Feet): 300 Feet Assistive device: None   Gait velocity: slower   General Gait Details: more guarded if not assisted, some wandering and sidestepping to maintain balance, but mildly unsteady at times.   Stairs            Wheelchair Mobility    Modified Rankin (Stroke Patients Only)       Balance     Sitting balance-Leahy Scale: Good       Standing balance-Leahy Scale: Fair                      Cognition Arousal/Alertness: Awake/alert Behavior During Therapy: WFL for tasks assessed/performed Overall Cognitive Status: Within Functional Limits for tasks assessed                      Exercises      General Comments General comments (skin integrity, edema, etc.): SpO2 96% on RA and EHR 100 bpm      Pertinent Vitals/Pain Pain Assessment: Faces Faces Pain Scale: Hurts a little bit Pain  Location: sternal Pain Descriptors / Indicators: Tightness Pain Intervention(s): Monitored during session    Home Living                      Prior Function            PT Goals (current goals can now be found in the care plan section) Acute Rehab PT Goals Patient Stated Goal: return home and play bridge PT Goal Formulation: With patient Time For Goal Achievement: 10/23/15 Potential to Achieve Goals: Good Progress towards PT goals: Progressing toward goals    Frequency  Min 3X/week    PT Plan Current plan remains appropriate    Co-evaluation             End of Session   Activity Tolerance: Patient tolerated treatment well Patient left: in chair;with call bell/phone within reach;with family/visitor present     Time: 4098-1191 PT Time Calculation (min) (ACUTE ONLY): 25 min  Charges:  $Gait Training: 8-22 mins $Therapeutic Activity: 8-22 mins                    G Codes:      Lupe Handley, Tessie Fass 10/17/2015, 5:27 PM  10/17/2015  Donnella Sham, PT [phone]  [phone]  (pager)

## 2015-10-17 NOTE — Progress Notes (Addendum)
CARDIAC REHAB PHASE I   PRE:  Rate/Rhythm: 74 SR  BP:  Sitting: 100/69        SaO2: 98 RA  MODE:  Ambulation: 300 ft   POST:  Rate/Rhythm: 80 SR  BP:  Sitting: 118/45         SaO2: 93 RA  Pt assisted from bedside commode to sink to wash hands. Pt demonstrated good sternal precautions. Pt ambulated 300 ft on RA, rolling walker, assist x1, slow, steady gait, tolerated well. Pt c/o DOE, fatigue, denies CP, dizziness, brief standing rest x1. Pt states she has only been walking once a day since she's been here.  Pt concerned that her legs are swollen, states she can't go home like that. Pt to recliner after walk, feet elevated, call bell within reach. Encouraged IS, up in chair with feet elevated, additional ambulation x2. Pt verbalized understanding. Will follow.  [phone]  Lenna Sciara, RN, BSN 10/17/2015 10:52 AM

## 2015-10-17 NOTE — Progress Notes (Addendum)
[address]       [place] [address]             [phone]      7 Days Post-Op Procedure(s) (LRB): MITRAL VALVE REPAIR (MVR) WITH SIZE 28 CARPENTIER-EDWARDS PHYSIO II ANNULOPLASTY RING (N/A) TRANSESOPHAGEAL ECHOCARDIOGRAM (TEE) (N/A) Subjective: C/o swelling in feet, otherwise no specific c/o  Objective: Vital signs in last 24 hours: Temp:  [97.4 F (36.3 C)-97.8 F (36.6 C)] 97.6 F (36.4 C) (10/19 0509) Pulse Rate:  [76-111] 80 (10/19 0854) Cardiac Rhythm:  [-] Normal sinus rhythm (10/19 0716) Resp:  [16-27] 16 (10/19 0509) BP: (96-125)/(51-78) 125/56 mmHg (10/19 0854) SpO2:  [96 %-98 %] 97 % (10/19 0509) Weight:  [130 lb 3.2 oz (59.058 kg)] 130 lb 3.2 oz (59.058 kg) (10/19 0300)  Hemodynamic parameters for last 24 hours:    Intake/Output from previous day: 10/18 0701 - 10/19 0700 In: 63 [P.O.:60; I.V.:3] Out: 1525 [Urine:1525] Intake/Output this shift: Total I/O In: 360 [P.O.:360] Out: 100 [Urine:100]  General appearance: alert, cooperative and no distress Heart: regular rate and rhythm Lungs: dim in bases Abdomen: benign Extremities: + pitting edema Wound: incis healing well  Lab Results:  Recent Labs  10/16/15 0404 10/17/15 0649  WBC 12.5* 13.8*  HGB 11.6* 11.9*  HCT 34.5* 36.5  PLT 152 166   BMET:  Recent Labs  10/16/15 0404 10/17/15 0649  NA 135 138  K 3.8 4.0  CL 105 102  CO2 22 24  GLUCOSE 95 101*  BUN 14 11  CREATININE 0.81 0.92  CALCIUM 8.5* 8.8*    PT/INR: No results for input(s): LABPROT, INR in the last 72 hours. ABG    Component Value Date/Time   PHART 7.370 10/10/2015 1846   HCO3 25.7* 10/10/2015 1846   TCO2 19 10/11/2015 1733   ACIDBASEDEF 3.0* 10/10/2015 1728   O2SAT 89.0 10/10/2015 1846   CBG (last 3)   Recent Labs  10/15/15 2148 10/16/15 0751 10/16/15 1140  GLUCAP 140* 107* 110*    Meds Scheduled Meds: . amiodarone  200 mg Oral BID  . apixaban  2.5 mg Oral BID  . aspirin EC  81  mg Oral Daily   Or  . aspirin  81 mg Per Tube Daily  . ezetimibe  10 mg Oral Daily  . folic acid  1 mg Oral Daily  . furosemide  40 mg Oral BID  . levothyroxine  100 mcg Oral QAC breakfast  . metoprolol tartrate  12.5 mg Oral BID   Or  . metoprolol tartrate  12.5 mg Per Tube BID  . metroNIDAZOLE  500 mg Oral 3 times per day  . multivitamin with minerals  1 tablet Oral Daily  . omega-3 acid ethyl esters  1 g Oral Daily  . pantoprazole  40 mg Oral Daily  . potassium chloride  20 mEq Oral BID  . rosuvastatin  2.5 mg Oral Daily  . sodium chloride  3 mL Intravenous Q12H  . vitamin B-12  1,000 mcg Oral Daily  . vitamin E  400 Units Oral Daily   Continuous Infusions:  PRN Meds:.sodium chloride, acetaminophen, ALPRAZolam, Gerhardt's butt cream, loperamide, Melatonin, metoprolol, ondansetron (ZOFRAN) IV, polyvinyl alcohol, sodium chloride, sodium chloride, traMADol  Xrays Dg Chest 2 View  10/17/2015  CLINICAL DATA:  Shortness of breath, 1 week status post mitral valve repair EXAM: CHEST  2 VIEW COMPARISON:  10/15/2015 FINDINGS: Small bilateral pleural effusions. No focal consolidation. No frank interstitial edema.  No pneumothorax. Cardiomegaly.  Prosthetic mitral valve.  Median sternotomy. Surgical clips overlying the left lower neck. Old left posterior 7th rib fracture. IMPRESSION: Small bilateral pleural effusions. No frank interstitial edema. No pneumothorax. Electronically Signed   By: Julian Hy M.D.   On: 10/17/2015 08:53    Assessment/Plan: S/P Procedure(s) (LRB): MITRAL VALVE REPAIR (MVR) WITH SIZE 28 CARPENTIER-EDWARDS PHYSIO II ANNULOPLASTY RING (N/A) TRANSESOPHAGEAL ECHOCARDIOGRAM (TEE) (N/A)  1 conts to improve 2 sinus rhythm, hemodyn stable 3 cont diuresis for volume overload/pleural effus 4 slight increase in leukocytosis, H/H improved, platelets improved 5 renal fxn normal, lytes ok, diuresing well 6 eliquis started 7 push rehab as able  LOS: 17 days     GOLD,WAYNE E 10/17/2015  Patient seen and examined, agree with above Still edematous but responding well to lasix Continue diuresis Will order TED hose Hep Ab+, serotonin release assay pending  Remo Lipps C. Roxan Hockey, MD [facility] [phone]

## 2015-10-18 MED ORDER — METOPROLOL TARTRATE 25 MG PO TABS
25.0000 mg | ORAL_TABLET | Freq: Two times a day (BID) | ORAL | Status: DC
  Administered 2015-10-19 – 2015-10-22 (×5): 25 mg via ORAL
  Filled 2015-10-18 (×8): qty 1

## 2015-10-18 MED ORDER — METOPROLOL TARTRATE 25 MG/10 ML ORAL SUSPENSION
25.0000 mg | Freq: Two times a day (BID) | ORAL | Status: DC
  Filled 2015-10-18: qty 10

## 2015-10-18 NOTE — Care Management Note (Signed)
Case Management Note 10/16/15- CM note started by Luz Lex RNCM  Patient Details  Name: Amy Fischer MRN: [id number] Date of Birth: January 08, 1929  Subjective/Objective:         Initially talked with patient but she did not feel well, nauseated.  States she lives at home alone, has children, but they all work so she thought she would just go to facility, short term.   Talked with daughter today, who is a Marine scientist and she plans to be off with her Mom, is working on [employer] paperwork now.  Will continue to follow.           Action/Plan:   Expected Discharge Date:                Expected Discharge Plan:  [place]  In-House Referral:     Discharge planning Services  CM Consult  Post Acute Care Choice:  Durable Medical Equipment, Home Health Choice offered to:  Patient, Adult Children  DME Arranged:  Bedside commode DME Agency:  [place]:    [place]:  [place]  Status of Service:  In process, will continue to follow  Medicare Important Message Given:  Yes-fourth notification given Date Medicare IM Given:    Medicare IM give by:    Date Additional Medicare IM Given:    Additional Medicare Important Message give by:     If discussed at [place] of Stay Meetings, dates discussed:    Additional Comments:  10/18/15- Marvetta Gibbons RN, BSN -  Pt has been started on Eliquis- benefits check done- Per rep at [employer]:  Eliquis: $20 for 90 day mail order / $24 for 30 day retail / No auth required  [facility] can [facility]: [facility], [address], [place] with pt and daughter at bedside- informed pt of above benefits for Eliquis- and 30 day free card given to daughter- also discussed discharge plans- per conversation- daughter plans to stay with pt at [facility]- discussed PT recommendations for [facility]- pt would like to have [place] on discharge- offered choice for [facility]- per pt choice she would like to use [place] for  services- pt will need HH-PT order placed with [facility] along with DME order for [facility]- MD please placed orders prior to discharge. NCM to continue to follow for [facility] referral once orders placed. Pt would like DME delivered to home instead of hospital understands there will be a delivery charge.  PT recommending SNF - Lets see how patient progresses.  Plan is for patient to go home with daughter who is a Marine scientist.   Dawayne Patricia, RN 10/18/2015, 3:05 PM

## 2015-10-18 NOTE — Progress Notes (Addendum)
       [address]       [place] [address]             [phone]          8 Days Post-Op Procedure(s) (LRB): MITRAL VALVE REPAIR (MVR) WITH SIZE 28 CARPENTIER-EDWARDS PHYSIO II ANNULOPLASTY RING (N/A) TRANSESOPHAGEAL ECHOCARDIOGRAM (TEE) (N/A)  Subjective: "I think I'm having a reaction to Tramadol." Took pain meds about an hour ago, now nauseated, "woozy" and feels sedated.  Had 3-4 stools yesterday, but they were a little more formed.    Objective: Vital signs in last 24 hours: Patient Vitals for the past 24 hrs:  BP Temp Temp src Pulse Resp SpO2 Weight  10/18/15 0557 (!) 111/44 mmHg 97.9 F (36.6 C) Oral 65 18 96 % 129 lb 13.6 oz (58.9 kg)  10/17/15 2008 (!) 118/58 mmHg 97.6 F (36.4 C) Oral 80 18 96 % -  10/17/15 1500 (!) 112/44 mmHg 97.8 F (36.6 C) Oral 72 20 100 % -  10/17/15 0854 (!) 125/56 mmHg - - 80 - - -   Current Weight  10/18/15 129 lb 13.6 oz (58.9 kg)  PRE-OPERATIVE WEIGHT: 51 kg   Intake/Output from previous day: 10/19 0701 - 10/20 0700 In: 600 [P.O.:600] Out: 953 [Urine:950; Stool:3]    PHYSICAL EXAM:  Heart: Irr irr, rate 80s Lungs: Clear, slightly decreased in bases Wound: Clean and dry Extremities: Mild LE edema    Lab Results: CBC: Recent Labs  10/16/15 0404 10/17/15 0649  WBC 12.5* 13.8*  HGB 11.6* 11.9*  HCT 34.5* 36.5  PLT 152 166   BMET:  Recent Labs  10/16/15 0404 10/17/15 0649  NA 135 138  K 3.8 4.0  CL 105 102  CO2 22 24  GLUCOSE 95 101*  BUN 14 11  CREATININE 0.81 0.92  CALCIUM 8.5* 8.8*    PT/INR: No results for input(s): LABPROT, INR in the last 72 hours.    Assessment/Plan: S/P Procedure(s) (LRB): MITRAL VALVE REPAIR (MVR) WITH SIZE 28 CARPENTIER-EDWARDS PHYSIO II ANNULOPLASTY RING (N/A) TRANSESOPHAGEAL ECHOCARDIOGRAM (TEE) (N/A)  CV- rate controlled AF this am. BPs stable. Continue Amio, Lopressor, Eliquis.  C diff- Symptomatically improving some, continue Flagyl.   Vol  overload/CHF- Continue diuresis.  Thrombocytopenia- Plts stable and improving, heparin assay pending.  Deconditioning- CRPI, PT.  Will d/c Tramadol at pt request and use plain Tylenol since she is not having much pain.   LOS: 18 days    COLLINS,GINA H 10/18/2015  Patient seen and examined, agree with above She doesn't feel as well this AM Back in a fib, rate controlled, will try increasing lopressor  Home in 1-2 days if she continues to progress  Remo Lipps C. Roxan Hockey, MD [facility] [phone]

## 2015-10-18 NOTE — Discharge Instructions (Addendum)
Information on my medicine - ELIQUIS (apixaban)  Why was Eliquis prescribed for you? Eliquis was prescribed for you to reduce the risk of a blood clot forming that can cause a stroke if you have a medical condition called atrial fibrillation (a type of irregular heartbeat).  What do You need to know about Eliquis ? Take your Eliquis TWICE DAILY - one tablet in the morning and one tablet in the evening with or without food. If you have difficulty swallowing the tablet whole please discuss with your pharmacist how to take the medication safely.  Take Eliquis exactly as prescribed by your doctor and DO NOT stop taking Eliquis without talking to the doctor who prescribed the medication.  Stopping may increase your risk of developing a stroke.  Refill your prescription before you run out.  After discharge, you should have regular check-up appointments with your healthcare provider that is prescribing your Eliquis.  In the future your dose may need to be changed if your kidney function or weight changes by a significant amount or as you get older.  What do you do if you miss a dose? If you miss a dose, take it as soon as you remember on the same day and resume taking twice daily.  Do not take more than one dose of ELIQUIS at the same time to make up a missed dose.  Important Safety Information A possible side effect of Eliquis is bleeding. You should call your healthcare provider right away if you experience any of the following: ? Bleeding from an injury or your nose that does not stop. ? Unusual colored urine (red or dark brown) or unusual colored stools (red or black). ? Unusual bruising for unknown reasons. ? A serious fall or if you hit your head (even if there is no bleeding).  Some medicines may interact with Eliquis and might increase your risk of bleeding or clotting while on Eliquis. To help avoid this, consult your healthcare provider or pharmacist prior to using any new  prescription or non-prescription medications, including herbals, vitamins, non-steroidal anti-inflammatory drugs (NSAIDs) and supplements.  This website has more information on Eliquis (apixaban): http://www.eliquis.com/eliquis/home Mitral Valve Replacement/Repair Care After Refer to this sheet in the next few weeks. These instructions provide you with information on caring for yourself after your procedure. Your health care provider may also give you specific instructions. Your treatment has been planned according to current medical practices, but problems sometimes occur. Call your health care provider if you have any problems or questions after your procedure.  HOME CARE INSTRUCTIONS  Take medicines only as directed by your health care provider. Take your temperature every morning for the first 7 days after surgery. Write these down. Weigh yourself every morning for at least 7 days after surgery. Write your weight down. Wear elastic stockings during the day for at least 2 weeks after surgery or as directed by your health care provider. Use them longer if your ankles are swollen. The stockings help blood flow and help reduce swelling in the legs. Take frequent naps or rest often throughout the day. Avoid lifting more than 10 lb (4.5 kg) or pushing or pulling things with your arms for 6-8 weeks or as directed by your health care provider. Avoid driving or airplane travel for 4-6 weeks after surgery or as directed. If you are riding in a car for an extended period, stop every 1-2 hours to stretch your legs. Avoid crossing your legs. Avoid climbing stairs and using the handrail  to pull yourself up for the first 2-3 weeks after surgery. Do not take baths for 2-4 weeks after surgery. Take showers once your health care provider approves. Pat incisions dry. Do not rub incisions with a washcloth or towel. Return to work as directed by your health care provider. Drink enough fluids to keep your urine  clear or pale yellow. Do not strain to have a bowel movement. Eat high-fiber foods if you become constipated. You may also take a medicine to help you have a bowel movement (laxative) as directed by your health care provider. Resume sexual activity as directed by your health care provider. SEEK MEDICAL CARE IF:  You develop a skin rash.  Your weight is increasing each day over 2-3 days. Your weight increases by 2 or more pounds (1 kg) in a single day. You have a fever. SEEK IMMEDIATE MEDICAL CARE IF:  You develop chest pain that is not coming from your incision. You develop shortness of breath or difficulty breathing. You have drainage, redness, swelling, or pain at your incision site. You have pus coming from your incision. You develop light-headedness. MAKE SURE YOU: Understand these directions. Will watch your condition. Will get help right away if you are not doing well or get worse.   This information is not intended to replace advice given to you by your health care provider. Make sure you discuss any questions you have with your health care provider.   Document Released: 07/04/2005 Document Revised: 01/05/2015 Document Reviewed: 05/17/2013 Elsevier Interactive Patient Education [employer].

## 2015-10-18 NOTE — Progress Notes (Signed)
Per Benefit check on Eliquis:  Per rep at [employer]:   Eliquis: $20 for 90 day mail order / $24 for 30 day retail / No auth required   [facility] can [facility]: [facility] [address], [employer]

## 2015-10-18 NOTE — Progress Notes (Signed)
CARDIAC REHAB PHASE I   PRE:  Rate/Rhythm: 98 a. fib  BP:  Sitting: 98/55        SaO2: 94 RA  MODE:  Ambulation: 300 ft   POST:  Rate/Rhythm: 109 a. fib  BP:  Sitting: 112/60         SaO2: 100 RA  Pt need much encouragement to walk this morning, states she has felt nauseated. Pt improved with ambulation. Pt ambulated 300 ft on RA, rolling walker, assist x1, slow, steady gait, tolerated well. Brief standing rest x1. Pt denies CP, dizziness, DOE. Pt assisted to bathroom upon return to room, voided small amount of dark urine, pt states "that's all I've done all day." Pt to recliner after walk, call bell within reach. Encouraged additional ambulation, pt verbalized understanding.  [phone]  Lenna Sciara, RN, BSN 10/18/2015 11:54 AM

## 2015-10-18 NOTE — Progress Notes (Signed)
Utilization review completed.  

## 2015-10-19 LAB — SEROTONIN RELEASE ASSAY (SRA)
SRA 100IU/mL UFH Ser-aCnc: <1 % (ref 0–20)
SRA, LOW DOSE HEPARIN: 1 % (ref 0–20)

## 2015-10-19 NOTE — Progress Notes (Addendum)
       [address]       [place] [address]             [phone]          9 Days Post-Op Procedure(s) (LRB): MITRAL VALVE REPAIR (MVR) WITH SIZE 28 CARPENTIER-EDWARDS PHYSIO II ANNULOPLASTY RING (N/A) TRANSESOPHAGEAL ECHOCARDIOGRAM (TEE) (N/A)  Subjective: Feels a little better today. No nausea and ate breakfast. Still having loose stools, but fewer yesterday. Remains in AF, rates go up with activity.   Objective: Vital signs in last 24 hours: Patient Vitals for the past 24 hrs:  BP Temp Temp src Pulse Resp SpO2 Weight  10/19/15 0426 105/90 mmHg 97.6 F (36.4 C) Oral (!) 103 18 92 % 129 lb 6.4 oz (58.695 kg)  10/19/15 0153 - - - - - - 129 lb 6.4 oz (58.695 kg)  10/18/15 2003 97/61 mmHg 97.8 F (36.6 C) Oral (!) 105 18 96 % -  10/18/15 1742 (!) 114/50 mmHg - - 88 - - -  10/18/15 1359 (!) 106/55 mmHg 97.6 F (36.4 C) Oral (!) 110 17 93 % -  10/18/15 1000 (!) 98/42 mmHg - - - - - -   Current Weight  10/19/15 129 lb 6.4 oz (58.695 kg)  PRE-OPERATIVE WEIGHT: 51 kg   Intake/Output from previous day: 10/20 0701 - 10/21 0700 In: 480 [P.O.:480] Out: 550 [Urine:550]    PHYSICAL EXAM:  Heart: Irr irr Lungs: Clear Wound: Clean and dry Abdomen: Soft, NT/ND, +BS Extremities: +LE edema    Lab Results: CBC: Recent Labs  10/17/15 0649  WBC 13.8*  HGB 11.9*  HCT 36.5  PLT 166   BMET:  Recent Labs  10/17/15 0649  NA 138  K 4.0  CL 102  CO2 24  GLUCOSE 101*  BUN 11  CREATININE 0.92  CALCIUM 8.8*    PT/INR: No results for input(s): LABPROT, INR in the last 72 hours.    Assessment/Plan: S/P Procedure(s) (LRB): MITRAL VALVE REPAIR (MVR) WITH SIZE 28 CARPENTIER-EDWARDS PHYSIO II ANNULOPLASTY RING (N/A) TRANSESOPHAGEAL ECHOCARDIOGRAM (TEE) (N/A)  CV- AF with variable rates. SBPs low normal. Continue Amio, Lopressor, Eliquis. Will have cardiology address rate control.  C diff- Symptomatically improving, continue Flagyl.   Vol  overload/CHF- Continue diuresis.  Thrombocytopenia- Plts stable and improving. Heparin Ab+, heparin assay pending.  Deconditioning- CRPI, PT.  Home once rates controlled and GI symptoms improved.   LOS: 19 days    COLLINS,GINA H 10/19/2015   Chart reviewed, patient examined, agree with above.

## 2015-10-19 NOTE — Progress Notes (Signed)
Pt ambulated 300 ft with son using RW.  HR up to 140, but not sustaining.  Once back in chair after walk, HR back down to 100-110's.  A few mins later, Pt became flushed, dizzy and nauseated.  SBP up to 152.  No change in HR, no fever.  Pt comforted by cool wash cloth.  One episode of dry heaves with no emesis.  Assisted to [facility] and Pt passed gas and stool.  Night shift arriving and report beginning.

## 2015-10-19 NOTE — Care Management Important Message (Signed)
Important Message  Patient Details  Name: MEILI KLECKLEY MRN: [id number] Date of Birth: Apr 25, 1929   Medicare Important Message Given:  Yes-fourth notification given    Dawayne Patricia, RN 10/19/2015, 2:46 PM

## 2015-10-19 NOTE — Progress Notes (Signed)
Physical Therapy Treatment Patient Details Name: Amy Fischer MRN: [id number] DOB: 10/13/1929 Today's Date: 10/19/2015    History of Present Illness 81 y.o. female with PMH of osteoporosis, OA, Hypothyroidism, [name] who presented to [facility] ED on 09/30/2015 for shortness of breath. Found to have acute diastolic CHF due to MR found by TEE.  S/P MVR repair 10/13.    PT Comments    Improving steadily with gait stability though she is guarded due to fears of falling.  She is starting to remember her sternal precautions without cues.  Follow Up Recommendations  Home health PT     Equipment Recommendations  None recommended by PT    Recommendations for Other Services       Precautions / Restrictions Precautions Precautions: Fall    Mobility  Bed Mobility               General bed mobility comments: In chair on arrival  Transfers Overall transfer level: Needs assistance   Transfers: Sit to/from Stand Sit to Stand: Min guard         General transfer comment: cues for sternal precautions, guard for safety  Ambulation/Gait Ambulation/Gait assistance: Min guard;Supervision Ambulation Distance (Feet): 300 Feet Assistive device: Rolling walker (2 wheeled) Gait Pattern/deviations: Step-through pattern Gait velocity: slower, but can speed up to cuing Gait velocity interpretation: Below normal speed for age/gender General Gait Details: steady with RW   Stairs Stairs: Yes Stairs assistance: Min assist Stair Management: One rail Right;One rail Left;Step to pattern;Forwards Number of Stairs: 4 General stair comments: safe with rail, mildly unsteady stepping down.  Wheelchair Mobility    Modified Rankin (Stroke Patients Only)       Balance Overall balance assessment: Needs assistance Sitting-balance support: No upper extremity supported Sitting balance-Leahy Scale: Good     Standing balance support: No upper extremity  supported Standing balance-Leahy Scale: Fair Standing balance comment: guarded due to fears of falling, but is steady without challenge.                    Cognition Arousal/Alertness: Awake/alert Behavior During Therapy: WFL for tasks assessed/performed Overall Cognitive Status: Within Functional Limits for tasks assessed                      Exercises      General Comments General comments (skin integrity, edema, etc.): EHR in the 110-120's afib      Pertinent Vitals/Pain Pain Assessment: Faces Faces Pain Scale: Hurts even more Pain Location: bottom Pain Descriptors / Indicators: Aching;Sore Pain Intervention(s): Monitored during session    Home Living                      Prior Function            PT Goals (current goals can now be found in the care plan section) Acute Rehab PT Goals Patient Stated Goal: return home and play bridge PT Goal Formulation: With patient Time For Goal Achievement: 10/23/15 Potential to Achieve Goals: Good Progress towards PT goals: Progressing toward goals    Frequency  Min 3X/week    PT Plan Current plan remains appropriate    Co-evaluation             End of Session   Activity Tolerance: Patient tolerated treatment well Patient left: in chair;with call bell/phone within reach;with family/visitor present     Time: 1610-1630 PT Time Calculation (min) (ACUTE ONLY): 20 min  Charges:  $Gait Training: 8-22 mins                    G Codes:      Tyrease Vandeberg, Tessie Fass 10/19/2015, 4:41 PM  10/19/2015  Donnella Sham, [place] [phone]  (pager)

## 2015-10-19 NOTE — Discharge Summary (Signed)
[address]       [place] [address]             [phone]              Discharge Summary  Name: Amy Fischer DOB: 1929/03/27 81 y.o. MRN: [id number]   Admission Date: 09/30/2015 Discharge Date:     Admitting Diagnosis: Shortness of breath Acute on chronic diastolic heart failure Atrial fibrillation  Discharge Diagnosis:  Severe mitral regurgitation Class 4 congestive heart failure Clostridium difficile colitis Postoperative atrial fibrillation Expected postoperative blood loss anemia Heparin induced thrombocytopenia  Principal Problem:   Respiratory distress Active Problems:   Hypothyroidism   GERD   Hypertension, isolated systolic   Acute on chronic diastolic congestive heart failure, NYHA class 3 (HCC)   Paroxysmal atrial fibrillation (HCC)   Acute respiratory failure with hypoxia and hypercapnia (HCC)   Mitral regurgitation   Left hip pain   Acute congestive heart failure (HCC)   Severe mitral regurgitation   Shortness of breath   S/P MVR (mitral valve repair)   Past Medical History  Diagnosis Date  . GERD (gastroesophageal reflux disease)   . Hyperlipidemia   . Hypothyroidism   . Osteoarthritis   . Osteoporosis   . Episodic recurrent vertigo     MRI Head 2003  . Allergy   . Bladder polyps   . Subclavian steal syndrome     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; CTA 2013 revealed open graft  . Hyperplastic colon polyp   . Esophageal spasm   . History of hepatitis     unknown type  . Asymptomatic carotid artery stenosis     R ICA 40% stenosis on CTA 12/2011.  [name] Anemia   . Cataract     BILATERAL-REMOVED  . Elevated blood pressure 03/25/2011    Bp readings borderline today has hx of elvation  In office and ok at home   Not checke recently   She gfeels was elevated from anxiety    . Diverticulosis   . Intestinal metaplasia of gastric mucosa   . Hepatic hemangioma      Procedures: MITRAL VALVE REPAIR -  10/10/2015  Triangular resection of posterior leaflet 2  28 mm Edwards Physio II annuloplasty ring    HPI:  The patient is a 81 y.o. female HPI: 81 yo woman with a past history significant for hypertension, subclavian steal with left carotid- subclavian bypass, arthritis, esophageal spasm, and GERD. She has no prior cardiac history. She had been having mild exertional dyspnea for about 6 months. On the date of admission, she was awakened from her sleep with severe shortness of breath. She felt like nothing she did helped the dyspnea. She called 911 and was brought to the ER at [facility] for further evaluation. A chest x-ray showed pulmonary edema. BNP was 460.  Her ECG was read as atrial fibrillation vs sinus tachycardia with PACs. She denied any chest pain during or leading up to the event. She was placed on Bipap and her breathing and heart rate improved.   A CT of the chest was performed which showed no PE, but was consistent with congestive heart failure and increased right heart pressures.  She was admitted for further evaluation.    Hospital Course:  The patient was admitted to [facility] on 09/30/2015.  Cardiology was consulted.  A transthoracic echo on 10/01/2015 showed preserved EF of 65-70% and mild LVH. Moderate mitral  regurgitation was noted and the RV was mildly dilated. PA peak pressure was elevated at 66 mm Hg. She was diuresed. Her symptoms improved, but she remained short of breath with even minor exertion. A TEE was then performed which showed severe MR with a flail P2 segment with a ruptured chord. She then underwent cardiac catheterization which showed nonobstructive coronary artery disease. A cardiac surgery consult was requested and Dr. Roxan Hockey saw the patient. It was felt that she would benefit from mitral valve repair or replacement. All risks, benefits and alternatives of surgery were explained in detail, and the patient agreed to proceed. The patient was taken to the  operating room and underwent the above procedure.    The postoperative course was notable for atrial fibrillation early on.  She was started on Amiodarone and converted to normal sinus rhythm.  She was hypotensive requiring pressors and did require DDD pacing. She required transfusion of packed red blood cells for symptomatic anemia.  She also developed diarrhea and leukocytosis and stool was positive for C difficile.  She was treated with Vancomycin and later switched to Flagyl. The patient was noted to have worsening thrombocytopenia. A heparin induced thrombocytopenia panel showed a positive heparin antibody.  She was seen by hematology and it was felt that she did not need to be started on bivalirudin. She was slowly weaned from pressors and her pacemaker was discontinued.  She remained in sinus rhythm. Her condition slowly began to improve and she was able to be transferred to stepdown on postop day 6.  The patient is making slow but steady progress.  She continues to go in and out of atrial fibrillation with rates elevated at times.  She was started on Lopressor and Eliquis in addition to Amiodarone. Cardiology was consulted to assist with medication management.  Platelet count is improving and serotonin assay remains pending. She has been volume overloaded and was started on Lasix, to which she is responding well.  Diarrhea is symptomatically improving on po Flagyl. Incisions are healing well. She is ambulating in the halls with assistance and is tolerating a diet.  We anticipate discharge home in the next 24-48 hours if she continues to progress.      Recent vital signs:  Filed Vitals:   10/19/15 0426  BP: 105/90  Pulse: 103  Temp: 97.6 F (36.4 C)  Resp: 18    Recent laboratory studies:  CBC: Recent Labs  10/17/15 0649  WBC 13.8*  HGB 11.9*  HCT 36.5  PLT 166   BMET:  Recent Labs  10/17/15 0649  NA 138  K 4.0  CL 102  CO2 24  GLUCOSE 101*  BUN 11  CREATININE 0.92    CALCIUM 8.8*    PT/INR: No results for input(s): LABPROT, INR in the last 72 hours.    Medication List    STOP taking these medications        aspirin 325 MG tablet  Replaced by:  aspirin 81 MG EC tablet     doxycycline 100 MG tablet  Commonly known as:  VIBRA-TABS     nebivolol 2.5 MG tablet  Commonly known as:  BYSTOLIC      TAKE these medications        acetaminophen 325 MG tablet  Commonly known as:  TYLENOL  Take 2 tablets (650 mg total) by mouth every 6 (six) hours as needed for mild pain.     amiodarone 200 MG tablet  Commonly known as:  PACERONE  Take  1 tablet (200 mg total) by mouth 2 (two) times daily.     apixaban 2.5 MG Tabs tablet  Commonly known as:  ELIQUIS  Take 1 tablet (2.5 mg total) by mouth 2 (two) times daily.     aspirin 81 MG EC tablet  Take 1 tablet (81 mg total) by mouth daily.     azelastine 0.1 % nasal spray  Commonly known as:  ASTELIN  Place 2 sprays into both nostrils 2 (two) times daily. Use in each nostril as directed     CRESTOR 5 MG tablet  Generic drug:  rosuvastatin  TAKE ONE-HALF (1/2) TABLET DAILY     ezetimibe 10 MG tablet  Commonly known as:  ZETIA  Take 1 tablet (10 mg total) by mouth daily.     fish oil-omega-3 fatty acids 1000 MG capsule  Take 1 g by mouth daily.     folic acid 1 MG tablet  Commonly known as:  FOLVITE  Take 1 tablet (1 mg total) by mouth daily.     furosemide 40 MG tablet  Commonly known as:  LASIX  Take 1 tablet (40 mg total) by mouth 2 (two) times daily.     glucosamine-chondroitin 500-400 MG tablet  Take 1 tablet by mouth 3 (three) times daily.     levothyroxine 100 MCG tablet  Commonly known as:  SYNTHROID, LEVOTHROID  TAKE 1 TABLET (100 MCG TOTAL) BY MOUTH DAILY BEFORE BREAKFAST.     Melatonin 5 MG Caps  Take 2.5 mg by mouth at bedtime as needed (sleep).     metoprolol tartrate 25 MG tablet  Commonly known as:  LOPRESSOR  Take 1 tablet (25 mg total) by mouth 2 (two) times daily.      MULTIPLE VITAMIN PO  Take 1 tablet by mouth daily. 50+ Senior Vitamin Daily     omeprazole 40 MG capsule  Commonly known as:  PRILOSEC  TAKE 1 CAPSULE DAILY     potassium chloride SA 20 MEQ tablet  Commonly known as:  K-DUR,KLOR-CON  Take 1 tablet (20 mEq total) by mouth 2 (two) times daily.     SALINE NASAL SPRAY NA  Place 1 spray into both nostrils daily as needed (congestion).     SYSTANE OP  Place 1 drop into both eyes 3 (three) times daily as needed (dry eyes). Use 1-2 Drops in both eyes     vitamin E 400 UNIT capsule  Take 400 Units by mouth daily.         Discharge Instructions:  The patient is to refrain from driving, heavy lifting or strenuous activity.  May shower daily and clean incisions with soap and water.  May resume regular diet.   Follow Up: Follow-up Information    Follow up with [name], MD On 11/20/2015.   Specialty:  Cardiothoracic Surgery   Why:  Have a chest x-ray at [facility] at 8:45, then see MD at 9:45   Contact information:   [address] [place] [address] [phone]       Follow up with Kirk Ruths, MD.   Specialty:  Cardiology   Contact information:   [address] [place] [address] [phone]        The patient has been discharged on:  1.Beta Blocker: Yes [ x ]  No [ ]   If No, reason:    2.Ace Inhibitor/ARB: Yes [ ]   No [ x ]  If No, reason: no cad,  on other HTN meds   3.Statin: Yes [ y ]  No [x ]  If No, reason:    4.Ecasa: Yes [ x ]  No [ ]   If No, reason:     COLLINS,GINA H 10/19/2015, 10:09 AM

## 2015-10-19 NOTE — Progress Notes (Addendum)
CARDIAC REHAB PHASE I   PRE:  Rate/Rhythm: 97 a. fib  BP:  Sitting: 94/56        SaO2: 98RA  MODE:  Ambulation: 450 ft   POST:  Rate/Rhythm: 110 a.fib  BP:  Sitting: 115/55         SaO2: 97 RA  Pt assisted to bedside commode. Pt ambulated 450 ft on RA, rolling walker, slow, steady gait, tolerated well. Pt denies CP, dizziness, DOE, declined rest stop. [facility] discharge education completed with pt daughter at bedside. Reviewed IS, sternal precautions, activity progression, exercise, risk factors, heart healthy diet, sodium restrictions, daily weights, and phase 2 cardiac rehab. Pt verbalized understanding. Pt agrees to phase 2 cardiac rehab. Will send referral to [facility]. Pt eating lunch, did not want to see cardiac surgery discharge video at this time, will offer tomorrow. Pt to recliner after walk, call bell within reach   [phone]  Lenna Sciara, RN, BSN 10/19/2015 12:01 PM

## 2015-10-20 MED ORDER — FUROSEMIDE 40 MG PO TABS: 40.0000 mg | ORAL_TABLET | Freq: Two times a day (BID) | ORAL | Status: DC

## 2015-10-20 MED ORDER — FUROSEMIDE 80 MG PO TABS
80.0000 mg | ORAL_TABLET | Freq: Every morning | ORAL | Status: DC
  Administered 2015-10-21 – 2015-10-22 (×2): 80 mg via ORAL
  Filled 2015-10-20 (×2): qty 1

## 2015-10-20 MED ORDER — FUROSEMIDE 40 MG PO TABS
40.0000 mg | ORAL_TABLET | Freq: Every evening | ORAL | Status: DC
  Administered 2015-10-20 – 2015-10-21 (×2): 40 mg via ORAL
  Filled 2015-10-20 (×2): qty 1

## 2015-10-20 NOTE — Progress Notes (Signed)
CARDIAC REHAB PHASE I   PRE:  Rate/Rhythm: 70 SR  BP:  Supine:   Sitting: 96/456  Standing:    SaO2: 96% 2L  MODE:  Ambulation: 150 ft   POST:  Rate/Rhythm: 78 SR  BP:  Supine:   Sitting: 104/56  Standing:    SaO2: 84% on RA, placed on 2 L of oxygen and sat were 99% upon return to her room.   She says she had a "episode" last night after walking with her son.  She did not desaturate with cardiac rehab yesterday on RA, but did today.  Afraid to walk further than 150 ft.  Reported to nurse.   [phone]  Liliane Channel RN, BSN 10/20/2015 12:06 PM

## 2015-10-20 NOTE — Progress Notes (Signed)
Patient Name: Amy Fischer Date of Encounter: 10/20/2015  Principal Problem:   Respiratory distress Active Problems:   Hypothyroidism   GERD   Hypertension, isolated systolic   Acute on chronic diastolic congestive heart failure, NYHA class 3 (HCC)   Paroxysmal atrial fibrillation (HCC)   Acute respiratory failure with hypoxia and hypercapnia (HCC)   Mitral regurgitation   Left hip pain   Acute congestive heart failure (HCC)   Severe mitral regurgitation   Shortness of breath   S/P MVR (mitral valve repair)   Length of Stay: 20  SUBJECTIVE  Still weak and dyspneic. Desaturates walking. Back to NSR as of 0530h today, HR in 70s.   CURRENT MEDS . amiodarone  200 mg Oral BID  . apixaban  2.5 mg Oral BID  . aspirin EC  81 mg Oral Daily   Or  . aspirin  81 mg Per Tube Daily  . ezetimibe  10 mg Oral Daily  . folic acid  1 mg Oral Daily  . furosemide  40 mg Oral BID  . levothyroxine  100 mcg Oral QAC breakfast  . metoprolol tartrate  25 mg Oral BID   Or  . metoprolol tartrate  25 mg Per Tube BID  . metroNIDAZOLE  500 mg Oral 3 times per day  . multivitamin with minerals  1 tablet Oral Daily  . omega-3 acid ethyl esters  1 g Oral Daily  . pantoprazole  40 mg Oral Daily  . potassium chloride  20 mEq Oral BID  . rosuvastatin  2.5 mg Oral Daily  . sodium chloride  3 mL Intravenous Q12H  . vitamin B-12  1,000 mcg Oral Daily  . vitamin E  400 Units Oral Daily    OBJECTIVE   Intake/Output Summary (Last 24 hours) at 10/20/15 1248 Last data filed at 10/19/15 2256  Gross per 24 hour  Intake      3 ml  Output      0 ml  Net      3 ml   Filed Weights   10/19/15 0153 10/19/15 0426 10/20/15 0553  Weight: 129 lb 6.4 oz (58.695 kg) 129 lb 6.4 oz (58.695 kg) 126 lb 6.4 oz (57.335 kg)    PHYSICAL EXAM Filed Vitals:   10/20/15 0447 10/20/15 0553 10/20/15 0622 10/20/15 1002  BP: 82/41  116/44 109/42  Pulse: 84   75  Temp: 98.3 F (36.8 C)     TempSrc: Oral      Resp: 16     Height:      Weight:  126 lb 6.4 oz (57.335 kg)    SpO2: 97%      General: Alert, oriented x3, no distress Head: no evidence of trauma, PERRL, EOMI, no exophtalmos or lid lag, no myxedema, no xanthelasma; normal ears, nose and oropharynx Neck: normal jugular venous pulsations and no hepatojugular reflux; brisk carotid pulses without delay and no carotid bruits Chest: clear to auscultation, no signs of consolidation by percussion or palpation, normal fremitus, symmetrical and full respiratory excursions Cardiovascular: normal position and quality of the apical impulse, regular rhythm, normal first and second heart sounds, no rubs or gallops, no murmur Abdomen: no tenderness or distention, no masses by palpation, no abnormal pulsatility or arterial bruits, normal bowel sounds, no hepatosplenomegaly Extremities: no clubbing, cyanosis; 1+ pedal edema bilaterally; 2+ radial, ulnar and brachial pulses bilaterally; 2+ right femoral, posterior tibial and dorsalis pedis pulses; 2+ left femoral, posterior tibial and dorsalis pedis pulses; no subclavian or femoral  bruits Neurological: grossly nonfocal  LABS Radiology Studies Imaging results have been reviewed and No results found.  TELE NSR 70s since 0536h today   ASSESSMENT AND PLAN  1. Persistent postop atrial fibrillation - resolved. Would continue amiodarone, current dose for 30 days postop, then stop it if no recurrence. Would also continue anticoagulation for at least that long. Ideally, confirm absence of atrial fibrillation by 30 day event monitor before stopping Eliquis.  2. S/P MV annuloplasty  repair for flail P2 segment/severe MR  3.Diastolic CHF due to MR - appears clinically hypervolemic and weight is 10-11 lb above admission, notwithstanding diarrhea. Increase diuretic.  4. C. Diff diarrhea, some improvement   Sanda Klein, MD, [facility] [phone] office [phone] pager 10/20/2015 12:48  PM

## 2015-10-20 NOTE — Progress Notes (Addendum)
[address]       [place] [address]             [phone]      10 Days Post-Op Procedure(s) (LRB): MITRAL VALVE REPAIR (MVR) WITH SIZE 28 CARPENTIER-EDWARDS PHYSIO II ANNULOPLASTY RING (N/A) TRANSESOPHAGEAL ECHOCARDIOGRAM (TEE) (N/A) Subjective: Feeling better, yesterday was a rough day  Objective: Vital signs in last 24 hours: Temp:  [97.6 F (36.4 C)-98.3 F (36.8 C)] 98.3 F (36.8 C) (10/22 0447) Pulse Rate:  [75-121] 75 (10/22 1002) Cardiac Rhythm:  [-] Heart block (10/22 0700) Resp:  [16-17] 16 (10/22 0447) BP: (82-152)/(41-115) 109/42 mmHg (10/22 1002) SpO2:  [93 %-97 %] 97 % (10/22 0447) Weight:  [126 lb 6.4 oz (57.335 kg)] 126 lb 6.4 oz (57.335 kg) (10/22 0553)  Hemodynamic parameters for last 24 hours:    Intake/Output from previous day: 10/21 0701 - 10/22 0700 In: 243 [P.O.:240; I.V.:3] Out: -  Intake/Output this shift:    General appearance: alert, cooperative and no distress Heart: regular rate and rhythm Lungs: mildly dim in the bases Abdomen: brnign Extremities: + LE edema Wound: incis healing well  Lab Results: No results for input(s): WBC, HGB, HCT, PLT in the last 72 hours. BMET: No results for input(s): NA, K, CL, CO2, GLUCOSE, BUN, CREATININE, CALCIUM in the last 72 hours.  PT/INR: No results for input(s): LABPROT, INR in the last 72 hours. ABG    Component Value Date/Time   PHART 7.370 10/10/2015 1846   HCO3 25.7* 10/10/2015 1846   TCO2 19 10/11/2015 1733   ACIDBASEDEF 3.0* 10/10/2015 1728   O2SAT 89.0 10/10/2015 1846   CBG (last 3)  No results for input(s): GLUCAP in the last 72 hours.  Meds Scheduled Meds: . amiodarone  200 mg Oral BID  . apixaban  2.5 mg Oral BID  . aspirin EC  81 mg Oral Daily   Or  . aspirin  81 mg Per Tube Daily  . ezetimibe  10 mg Oral Daily  . folic acid  1 mg Oral Daily  . furosemide  40 mg Oral BID  . levothyroxine  100 mcg Oral QAC breakfast  . metoprolol tartrate  25 mg  Oral BID   Or  . metoprolol tartrate  25 mg Per Tube BID  . metroNIDAZOLE  500 mg Oral 3 times per day  . multivitamin with minerals  1 tablet Oral Daily  . omega-3 acid ethyl esters  1 g Oral Daily  . pantoprazole  40 mg Oral Daily  . potassium chloride  20 mEq Oral BID  . rosuvastatin  2.5 mg Oral Daily  . sodium chloride  3 mL Intravenous Q12H  . vitamin B-12  1,000 mcg Oral Daily  . vitamin E  400 Units Oral Daily   Continuous Infusions:  PRN Meds:.sodium chloride, acetaminophen, ALPRAZolam, [name]'s butt cream, loperamide, Melatonin, metoprolol, ondansetron (ZOFRAN) IV, polyvinyl alcohol, sodium chloride, sodium chloride  Xrays No results found.  Assessment/Plan: S/P Procedure(s) (LRB): MITRAL VALVE REPAIR (MVR) WITH SIZE 28 CARPENTIER-EDWARDS PHYSIO II ANNULOPLASTY RING (N/A) TRANSESOPHAGEAL ECHOCARDIOGRAM (TEE) (N/A)  1 maintaining sinus rhythm 2 loose stools slowing, on flagyl 3 cont diuretics for volume overload, edema improving 4 no new labs- recheck in am 5 wean O2 off, cont rehab 6 poss home tomorrow   LOS: 20 days    Fischer,Amy E 10/20/2015  Still weak but improving, back in sinus today I have seen and examined Amy Fischer and agree with  the above assessment  and plan.  Amy Isaac MD [facility] [phone] Office [phone] 10/20/2015 1:21 PM

## 2015-10-21 LAB — BASIC METABOLIC PANEL
ANION GAP: 5 (ref 5–15)
BUN: 14 mg/dL (ref 6–20)
CO2: 27 mmol/L (ref 22–32)
Calcium: 8.2 mg/dL — ABNORMAL LOW (ref 8.9–10.3)
Chloride: 102 mmol/L (ref 101–111)
Creatinine, Ser: 1.1 mg/dL — ABNORMAL HIGH (ref 0.44–1.00)
GFR calc Af Amer: 52 mL/min — ABNORMAL LOW (ref >60)
GFR, EST NON AFRICAN AMERICAN: 45 mL/min — AB (ref >60)
GLUCOSE: 114 mg/dL — AB (ref 65–99)
POTASSIUM: 3.7 mmol/L (ref 3.5–5.1)
Sodium: 134 mmol/L — ABNORMAL LOW (ref 135–145)

## 2015-10-21 LAB — CBC
HEMATOCRIT: 31.7 % — AB (ref 36.0–46.0)
HEMOGLOBIN: 10.3 g/dL — AB (ref 12.0–15.0)
MCH: 30.8 pg (ref 26.0–34.0)
MCHC: 32.5 g/dL (ref 30.0–36.0)
MCV: 94.9 fL (ref 78.0–100.0)
Platelets: 185 10*3/uL (ref 150–400)
RBC: 3.34 MIL/uL — AB (ref 3.87–5.11)
RDW: 15.7 % — ABNORMAL HIGH (ref 11.5–15.5)
WBC: 10.9 10*3/uL — AB (ref 4.0–10.5)

## 2015-10-21 NOTE — Progress Notes (Addendum)
11 Days Post-Op Procedure(s) (LRB): MITRAL VALVE REPAIR (MVR) WITH SIZE 28 CARPENTIER-EDWARDS PHYSIO II ANNULOPLASTY RING (N/A) TRANSESOPHAGEAL ECHOCARDIOGRAM (TEE) (N/A) Subjective: Feels ok, currently in afib with controlled rate. Loose but not watery stools- improved  Objective: Vital signs in last 24 hours: Temp:  [97.6 F (36.4 C)-98.1 F (36.7 C)] 98.1 F (36.7 C) (10/23 0455) Pulse Rate:  [66-87] 87 (10/23 0937) Cardiac Rhythm:  [-] Heart block (10/23 0700) Resp:  [12-17] 16 (10/23 0455) BP: (105-124)/(45-67) 124/67 mmHg (10/23 0937) SpO2:  [95 %-100 %] 100 % (10/23 0455) Weight:  [125 lb 9.6 oz (56.972 kg)] 125 lb 9.6 oz (56.972 kg) (10/23 0455)  Hemodynamic parameters for last 24 hours:    Intake/Output from previous day: 10/22 0701 - 10/23 0700 In: 480 [P.O.:480] Out: -  Intake/Output this shift:    General appearance: alert, cooperative and no distress Heart: irregularly irregular rhythm Lungs: mildly dim in bases Abdomen: soft, non-tender Extremities: + LE pitting edema Wound: incis healing well  Lab Results:  Recent Labs  10/21/15 0305  WBC 10.9*  HGB 10.3*  HCT 31.7*  PLT 185   BMET:  Recent Labs  10/21/15 0305  NA 134*  K 3.7  CL 102  CO2 27  GLUCOSE 114*  BUN 14  CREATININE 1.10*  CALCIUM 8.2*    PT/INR: No results for input(s): LABPROT, INR in the last 72 hours. ABG    Component Value Date/Time   PHART 7.370 10/10/2015 1846   HCO3 25.7* 10/10/2015 1846   TCO2 19 10/11/2015 1733   ACIDBASEDEF 3.0* 10/10/2015 1728   O2SAT 89.0 10/10/2015 1846   CBG (last 3)  No results for input(s): GLUCAP in the last 72 hours.  Meds Scheduled Meds: . amiodarone  200 mg Oral BID  . apixaban  2.5 mg Oral BID  . aspirin EC  81 mg Oral Daily   Or  . aspirin  81 mg Per Tube Daily  . ezetimibe  10 mg Oral Daily  . folic acid  1 mg Oral Daily  . furosemide  40 mg Oral QPM  . furosemide  80 mg Oral q morning - 10a  . levothyroxine  100 mcg  Oral QAC breakfast  . metoprolol tartrate  25 mg Oral BID   Or  . metoprolol tartrate  25 mg Per Tube BID  . multivitamin with minerals  1 tablet Oral Daily  . omega-3 acid ethyl esters  1 g Oral Daily  . pantoprazole  40 mg Oral Daily  . potassium chloride  20 mEq Oral BID  . rosuvastatin  2.5 mg Oral Daily  . sodium chloride  3 mL Intravenous Q12H  . vitamin B-12  1,000 mcg Oral Daily  . vitamin E  400 Units Oral Daily   Continuous Infusions:  PRN Meds:.sodium chloride, acetaminophen, ALPRAZolam, Shantil Vallejo's butt cream, loperamide, Melatonin, metoprolol, ondansetron (ZOFRAN) IV, polyvinyl alcohol, sodium chloride, sodium chloride  Xrays No results found.  Assessment/Plan: S/P Procedure(s) (LRB): MITRAL VALVE REPAIR (MVR) WITH SIZE 28 CARPENTIER-EDWARDS PHYSIO II ANNULOPLASTY RING (N/A) TRANSESOPHAGEAL ECHOCARDIOGRAM (TEE) (N/A)  1 remains volume overloaded, I+O's not accurate will cont BID lasix- was increased by cardiology for today.  2 afib, rate is controlled, on apixiban , amio and lopressor.  3 needs pacer wires out- will order     LOS: 21 days    GOLD,Amy E 10/21/2015  Slowly improving, taking po diet better I have seen and examined Amy Fischer and agree with the above assessment  and  plan.  Grace Isaac MD [facility] [phone] Office [phone] 10/21/2015 12:47 PM

## 2015-10-21 NOTE — Progress Notes (Signed)
dc'ed pacing wires pt. tolerated well 

## 2015-10-21 NOTE — Progress Notes (Signed)
Notified by CCMD that pt converted to afib. HR was controlled in the 80s-90s. Pt was asymptomatic. VSS. Pt is currently back converted to sinus rhythm. Pt is resting. No new complaints. Will continue to monitor.   Laquisha Northcraft, RN

## 2015-10-21 NOTE — Progress Notes (Signed)
Patient Name: Amy Fischer Date of Encounter: 10/21/2015  Principal Problem:   Respiratory distress Active Problems:   Hypothyroidism   GERD   Hypertension, isolated systolic   Acute on chronic diastolic congestive heart failure, NYHA class 3 (HCC)   Paroxysmal atrial fibrillation (HCC)   Acute respiratory failure with hypoxia and hypercapnia (HCC)   Mitral regurgitation   Left hip pain   Acute congestive heart failure (HCC)   Severe mitral regurgitation   Shortness of breath   S/P MVR (mitral valve repair)   Length of Stay: 21  SUBJECTIVE  Diarrhea improving. Back in atrial fibrillation, but rate now controlled. 10 lb above admission weight, no net diuresis last 24 h. Diuretics increased today.  CURRENT MEDS . amiodarone  200 mg Oral BID  . apixaban  2.5 mg Oral BID  . aspirin EC  81 mg Oral Daily   Or  . aspirin  81 mg Per Tube Daily  . ezetimibe  10 mg Oral Daily  . folic acid  1 mg Oral Daily  . furosemide  40 mg Oral QPM  . furosemide  80 mg Oral q morning - 10a  . levothyroxine  100 mcg Oral QAC breakfast  . metoprolol tartrate  25 mg Oral BID   Or  . metoprolol tartrate  25 mg Per Tube BID  . multivitamin with minerals  1 tablet Oral Daily  . omega-3 acid ethyl esters  1 g Oral Daily  . pantoprazole  40 mg Oral Daily  . potassium chloride  20 mEq Oral BID  . rosuvastatin  2.5 mg Oral Daily  . sodium chloride  3 mL Intravenous Q12H  . vitamin B-12  1,000 mcg Oral Daily  . vitamin E  400 Units Oral Daily    OBJECTIVE   Intake/Output Summary (Last 24 hours) at 10/21/15 1056 Last data filed at 10/20/15 1459  Gross per 24 hour  Intake    240 ml  Output      0 ml  Net    240 ml   Filed Weights   10/19/15 0426 10/20/15 0553 10/21/15 0455  Weight: 129 lb 6.4 oz (58.695 kg) 126 lb 6.4 oz (57.335 kg) 125 lb 9.6 oz (56.972 kg)    PHYSICAL EXAM Filed Vitals:   10/20/15 1458 10/20/15 2150 10/21/15 0455 10/21/15 0937  BP: 105/45 108/47 122/52 124/67   Pulse: 75 73 66 87  Temp: 97.6 F (36.4 C) 97.9 F (36.6 C) 98.1 F (36.7 C)   TempSrc: Oral Oral Oral   Resp: 17 12 16    Height:      Weight:   125 lb 9.6 oz (56.972 kg)   SpO2: 98% 95% 100%    General: Alert, oriented x3, no distress Head: no evidence of trauma, PERRL, EOMI, no exophtalmos or lid lag, no myxedema, no xanthelasma; normal ears, nose and oropharynx Neck: normal jugular venous pulsations and no hepatojugular reflux; brisk carotid pulses without delay and no carotid bruits Chest: clear to auscultation, no signs of consolidation by percussion or palpation, normal fremitus, symmetrical and full respiratory excursions Cardiovascular: normal position and quality of the apical impulse, regular rhythm, normal first and second heart sounds, no rubs or gallops, no murmur Abdomen: no tenderness or distention, no masses by palpation, no abnormal pulsatility or arterial bruits, normal bowel sounds, no hepatosplenomegaly Extremities: no clubbing, cyanosis; 1+ pedal edema bilaterally; 2+ radial, ulnar and brachial pulses bilaterally; 2+ right femoral, posterior tibial and dorsalis pedis pulses; 2+ left femoral, posterior  tibial and dorsalis pedis pulses; no subclavian or femoral bruits Neurological: grossly nonfocal  LABS  CBC  Recent Labs  10/21/15 0305  WBC 10.9*  HGB 10.3*  HCT 31.7*  MCV 94.9  PLT 144   Basic Metabolic Panel  Recent Labs  10/21/15 0305  NA 134*  K 3.7  CL 102  CO2 27  GLUCOSE 114*  BUN 14  CREATININE 1.10*  CALCIUM 8.2*   Liver Function Tests  Radiology Studies Imaging results have been reviewed and No results found.  TELE atrial fibrillation with fair rate control  ASSESSMENT AND PLAN  1. Persistent postop atrial fibrillation - resolved, then recurred. Would continue amiodarone, current dose for 30 days postop, then stop it if no recurrence. Would also continue anticoagulation for at least that long. Ideally, confirm absence of atrial  fibrillation by 30 day event monitor before stopping Eliquis.  2. S/P MV annuloplasty repair for flail P2 segment/severe MR  3.Diastolic CHF due to MR - appears clinically hypervolemic and weight is 10 lb above admission. Increased diuretic today.  4. C. Diff diarrhea, improvement   Sanda Klein, MD, [facility] [phone] office [phone] pager 10/21/2015 10:56 AM

## 2015-10-22 LAB — BASIC METABOLIC PANEL
ANION GAP: 9 (ref 5–15)
BUN: 11 mg/dL (ref 6–20)
CALCIUM: 9.2 mg/dL (ref 8.9–10.3)
CO2: 24 mmol/L (ref 22–32)
Chloride: 103 mmol/L (ref 101–111)
Creatinine, Ser: 0.78 mg/dL (ref 0.44–1.00)
GFR calc Af Amer: >60 mL/min (ref >60)
GFR calc non Af Amer: >60 mL/min (ref >60)
GLUCOSE: 145 mg/dL — AB (ref 65–99)
Potassium: 3.9 mmol/L (ref 3.5–5.1)
Sodium: 136 mmol/L (ref 135–145)

## 2015-10-22 MED ORDER — FUROSEMIDE 40 MG PO TABS: 40.0000 mg | ORAL_TABLET | Freq: Two times a day (BID) | ORAL | Status: DC

## 2015-10-22 MED ORDER — ACETAMINOPHEN 325 MG PO TABS: 650.0000 mg | ORAL_TABLET | Freq: Four times a day (QID) | ORAL | Status: DC | PRN

## 2015-10-22 MED ORDER — AMIODARONE HCL 200 MG PO TABS
200.0000 mg | ORAL_TABLET | Freq: Two times a day (BID) | ORAL | Status: DC
Start: 2015-10-22 — End: 2015-11-06

## 2015-10-22 MED ORDER — APIXABAN 2.5 MG PO TABS: 2.5000 mg | ORAL_TABLET | Freq: Two times a day (BID) | ORAL | Status: DC

## 2015-10-22 MED ORDER — METOPROLOL TARTRATE 25 MG PO TABS: 25.0000 mg | ORAL_TABLET | Freq: Two times a day (BID) | ORAL | Status: DC

## 2015-10-22 MED ORDER — POTASSIUM CHLORIDE CRYS ER 20 MEQ PO TBCR: 20.0000 meq | EXTENDED_RELEASE_TABLET | Freq: Two times a day (BID) | ORAL | Status: DC

## 2015-10-22 MED ORDER — ASPIRIN 81 MG PO TBEC: 81.0000 mg | DELAYED_RELEASE_TABLET | Freq: Every day | ORAL | Status: DC

## 2015-10-22 MED ORDER — FOLIC ACID 1 MG PO TABS: 1.0000 mg | ORAL_TABLET | Freq: Every day | ORAL | Status: DC

## 2015-10-22 NOTE — Progress Notes (Signed)
CARDIAC REHAB PHASE I   PRE:  Rate/Rhythm: 82 afib    BP: sitting 101/75    SaO2: 96 RA  MODE:  Ambulation: 350 ft   POST:  Rate/Rhythm: 101 afib    BP: sitting 96/65     SaO2: 97 RA  Tolerated well, no c/o. Pt admittedly irritable, she wants to go home. This was third walk today. Denied problems, VSS. Return to recliner. [phone]   Amy Fischer CES, ACSM 10/22/2015 11:39 AM

## 2015-10-22 NOTE — Progress Notes (Signed)
Subjective: +orthopnea  Objective: Vital signs in last 24 hours: Temp:  [97.8 F (36.6 C)-98.1 F (36.7 C)] 97.8 F (36.6 C) (10/24 0511) Pulse Rate:  [69-87] 79 (10/24 0511) Resp:  [16] 16 (10/24 0511) BP: (112-139)/(40-67) 136/43 mmHg (10/24 0511) SpO2:  [96 %-98 %] 96 % (10/24 0511) Weight:  [121 lb 3.2 oz (54.976 kg)] 121 lb 3.2 oz (54.976 kg) (10/24 0511) Last BM Date: 10/21/15  Intake/Output from previous day: 10/23 0701 - 10/24 0700 In: -  Out: 450 [Urine:450] Intake/Output this shift:    Medications Scheduled Meds: . amiodarone  200 mg Oral BID  . apixaban  2.5 mg Oral BID  . aspirin EC  81 mg Oral Daily   Or  . aspirin  81 mg Per Tube Daily  . ezetimibe  10 mg Oral Daily  . folic acid  1 mg Oral Daily  . furosemide  40 mg Oral QPM  . furosemide  80 mg Oral q morning - 10a  . levothyroxine  100 mcg Oral QAC breakfast  . metoprolol tartrate  25 mg Oral BID   Or  . metoprolol tartrate  25 mg Per Tube BID  . multivitamin with minerals  1 tablet Oral Daily  . omega-3 acid ethyl esters  1 g Oral Daily  . pantoprazole  40 mg Oral Daily  . potassium chloride  20 mEq Oral BID  . rosuvastatin  2.5 mg Oral Daily  . sodium chloride  3 mL Intravenous Q12H  . vitamin B-12  1,000 mcg Oral Daily  . vitamin E  400 Units Oral Daily   Continuous Infusions:  PRN Meds:.sodium chloride, acetaminophen, ALPRAZolam, Gerhardt's butt cream, loperamide, Melatonin, metoprolol, ondansetron (ZOFRAN) IV, polyvinyl alcohol, sodium chloride, sodium chloride  PE: General appearance: alert, cooperative and no distress Neck:  JVD elevated Lungs: clear to auscultation bilaterally Heart: irregularly irregular rhythm Extremities: 1+ ankle edema Pulses: 2+ and symmetric Skin: Warm and dry Neurologic: Grossly normal  Lab Results:   Recent Labs  10/21/15 0305  WBC 10.9*  HGB 10.3*  HCT 31.7*  PLT 185   BMET  Recent Labs  10/21/15 0305  NA 134*  K 3.7  CL 102  CO2 27    GLUCOSE 114*  BUN 14  CREATININE 1.10*  CALCIUM 8.2*      Assessment/Plan  Principal Problem:   Respiratory distress Active Problems:   Hypothyroidism   GERD   Hypertension, isolated systolic   Acute on chronic diastolic congestive heart failure, NYHA class 3 (HCC)   Paroxysmal atrial fibrillation (HCC)   Acute respiratory failure with hypoxia and hypercapnia (HCC)   Mitral regurgitation   Left hip pain   Acute congestive heart failure (HCC)   Severe mitral regurgitation   Shortness of breath   S/P MVR (mitral valve repair)  1. Persistent atrial fibrillation - Back in Afib.  Rate controlled. On review of notes  patient was in atrial fib on arrival to ER on 10/4, prior to surgery  WOuld continue po load of amiodarone.Rates are OK  Continue Eliquis   Ge EKG  If remains in Afib would plan cardioversion once she has   2. S/P MV annuloplasty repair on 10/12  for flail P2 segment/severe MR  3.Diastolic CHF due to MR - Net fluids: -0.5L/+2.4L.   Would continue diuresis  4. C. Diff diarrhea, improvement   LOS: 22 days    HAGER, BRYAN PA-C 10/22/2015 8:27 AM  Patient seen and examined  I agree with  findings as noted above by B Hager  I have amended to reflect my findings.    Dorris Carnes

## 2015-10-22 NOTE — Progress Notes (Addendum)
[address]       [place] [address]             [phone]      12 Days Post-Op Procedure(s) (LRB): MITRAL VALVE REPAIR (MVR) WITH SIZE 28 CARPENTIER-EDWARDS PHYSIO II ANNULOPLASTY RING (N/A) TRANSESOPHAGEAL ECHOCARDIOGRAM (TEE) (N/A) Subjective: Feeling better, SOB - improving, edema- improving  Objective: Vital signs in last 24 hours: Temp:  [97.8 F (36.6 C)-98.1 F (36.7 C)] 97.8 F (36.6 C) (10/24 0511) Pulse Rate:  [69-87] 79 (10/24 0511) Cardiac Rhythm:  [-] Atrial fibrillation (10/24 0742) Resp:  [16] 16 (10/24 0511) BP: (112-139)/(40-67) 136/43 mmHg (10/24 0511) SpO2:  [96 %-98 %] 96 % (10/24 0511) Weight:  [121 lb 3.2 oz (54.976 kg)] 121 lb 3.2 oz (54.976 kg) (10/24 0511)  Hemodynamic parameters for last 24 hours:    Intake/Output from previous day: 10/23 0701 - 10/24 0700 In: -  Out: 450 [Urine:450] Intake/Output this shift:    General appearance: alert, cooperative and no distress Heart: irregularly irregular rhythm Lungs: mildly dim in bases Abdomen: benign Extremities: + ankle edema, improved overall Wound: incis healing well  Lab Results:  Recent Labs  10/21/15 0305  WBC 10.9*  HGB 10.3*  HCT 31.7*  PLT 185   BMET:  Recent Labs  10/21/15 0305  NA 134*  K 3.7  CL 102  CO2 27  GLUCOSE 114*  BUN 14  CREATININE 1.10*  CALCIUM 8.2*    PT/INR: No results for input(s): LABPROT, INR in the last 72 hours. ABG    Component Value Date/Time   PHART 7.370 10/10/2015 1846   HCO3 25.7* 10/10/2015 1846   TCO2 19 10/11/2015 1733   ACIDBASEDEF 3.0* 10/10/2015 1728   O2SAT 89.0 10/10/2015 1846   CBG (last 3)  No results for input(s): GLUCAP in the last 72 hours.  Meds Scheduled Meds: . amiodarone  200 mg Oral BID  . apixaban  2.5 mg Oral BID  . aspirin EC  81 mg Oral Daily   Or  . aspirin  81 mg Per Tube Daily  . ezetimibe  10 mg Oral Daily  . folic acid  1 mg Oral Daily  . furosemide  40 mg Oral QPM  .  furosemide  80 mg Oral q morning - 10a  . levothyroxine  100 mcg Oral QAC breakfast  . metoprolol tartrate  25 mg Oral BID   Or  . metoprolol tartrate  25 mg Per Tube BID  . multivitamin with minerals  1 tablet Oral Daily  . omega-3 acid ethyl esters  1 g Oral Daily  . pantoprazole  40 mg Oral Daily  . potassium chloride  20 mEq Oral BID  . rosuvastatin  2.5 mg Oral Daily  . sodium chloride  3 mL Intravenous Q12H  . vitamin B-12  1,000 mcg Oral Daily  . vitamin E  400 Units Oral Daily   Continuous Infusions:  PRN Meds:.sodium chloride, acetaminophen, ALPRAZolam, Gerhardt's butt cream, loperamide, Melatonin, metoprolol, ondansetron (ZOFRAN) IV, polyvinyl alcohol, sodium chloride, sodium chloride  Xrays No results found.  Assessment/Plan: S/P Procedure(s) (LRB): MITRAL VALVE REPAIR (MVR) WITH SIZE 28 CARPENTIER-EDWARDS PHYSIO II ANNULOPLASTY RING (N/A) TRANSESOPHAGEAL ECHOCARDIOGRAM (TEE) (N/A)  1 she is improved regarding heart failure/volume overload- cont diuresis,  2 afib - missing atrial kick is contrib to failure, cont current rx for now 3 cont rehab 4 prob ready for d/c in am if no change    LOS: 22 days  GOLD,WAYNE E 10/22/2015  Patient seen and examined, agree with above Home today  [place]. Roxan Hockey, MD [facility] [phone]

## 2015-10-22 NOTE — Care Management Note (Signed)
Case Management Note 10/16/15- CM note started by Luz Lex RNCM  Patient Details  Name: Amy Fischer MRN: [id number] Date of Birth: [birth year]  Subjective/Objective:         Initially talked with patient but she did not feel well, nauseated.  States she lives at home alone, has children, but they all work so she thought she would just go to facility, short term.   Talked with daughter today, who is a Marine scientist and she plans to be off with her Mom, is working on [employer] paperwork now.  Will continue to follow.           Action/Plan:   Expected Discharge Date:  10/22/15              Expected Discharge Plan:  [place]  In-House Referral:     Discharge planning Services  CM Consult  Post Acute Care Choice:  Durable Medical Equipment, Home Health Choice offered to:  Patient, Adult Children  DME Arranged:  Bedside commode DME Agency:  [place]:  PT [place]:  [place]  Status of Service:  Completed, signed off  Medicare Important Message Given:  Yes-fourth notification given Date Medicare IM Given:    Medicare IM give by:    Date Additional Medicare IM Given:    Additional Medicare Important Message give by:     If discussed at [place] of Stay Meetings, dates discussed:    Additional Comments:  10/22/15- Marvetta Gibbons RN, BSN- pt for d/c home today- order for HH-PT has been placed- call made to [facility] with [name]- referral had been received  And accepted - [facility] to f/u with pt for [facility] services- call made to [facility] with [facility] for DME needs- to f/u with pt regarding 3n1-   10/18/15- Marvetta Gibbons RN, BSN -  Pt has been started on Eliquis- benefits check done- Per rep at [facility]:  Eliquis: $20 for 90 day mail order / $24 for 30 day retail / No auth required  Patient can use: [facility], [place], [place] with pt and daughter at bedside- informed pt of above benefits for Eliquis- and 30 day free card given to  daughter- also discussed discharge plans- per conversation- daughter plans to stay with pt at [facility]- discussed PT recommendations for [facility]- pt would like to have [place] on discharge- offered choice for [place]- per pt choice she would like to use [place] for services- pt will need HH-PT order placed with [facility] along with DME order for [facility]- MD please placed orders prior to discharge. NCM to continue to follow for [facility] referral once orders placed. Pt would like DME delivered to home instead of hospital understands there will be a delivery charge.  PT recommending SNF - Lets see how patient progresses.  Plan is for patient to go home with daughter who is a Marine scientist.   Dawayne Patricia, RN 10/22/2015, 1:39 PM

## 2015-10-22 NOTE — Progress Notes (Signed)
Pt discharged to home with daughter. I reviewed discharge instructions with pt and her daughter. They both verbalized understanding. PIV and telemetry were discontinued. Pt discharged at 1540 to home.

## 2015-10-24 DIAGNOSIS — I480 Paroxysmal atrial fibrillation: Secondary | ICD-10-CM | POA: Diagnosis not present

## 2015-10-24 DIAGNOSIS — M1990 Unspecified osteoarthritis, unspecified site: Secondary | ICD-10-CM | POA: Diagnosis not present

## 2015-10-24 DIAGNOSIS — I5033 Acute on chronic diastolic (congestive) heart failure: Secondary | ICD-10-CM | POA: Diagnosis not present

## 2015-10-24 DIAGNOSIS — M810 Age-related osteoporosis without current pathological fracture: Secondary | ICD-10-CM | POA: Diagnosis not present

## 2015-10-24 DIAGNOSIS — Z48812 Encounter for surgical aftercare following surgery on the circulatory system: Secondary | ICD-10-CM | POA: Diagnosis not present

## 2015-10-24 DIAGNOSIS — I10 Essential (primary) hypertension: Secondary | ICD-10-CM | POA: Diagnosis not present

## 2015-10-25 ENCOUNTER — Other Ambulatory Visit: Payer: Self-pay

## 2015-10-25 NOTE — Patient Outreach (Addendum)
Subjective: Ms. Colantonio is an 81 yo with a recent hospitalization for diastolic heart failure.  I identified patient as eligible for pharmacy transition of care project.  Medication review completed.  I called patient and read consent.  Patient agreed to participate in transition of care project.  She asked for me to speak with her daughter, [name].   I reviewed all medications with the patient's daughter.  Patient's daighter reports adherence with medications.    Patient reports/denies weighing daily as instructed.  Denies shortness or breath.  Reports some swelling in her feet and stomach.  The daughter feels this is mainly due to eating pizza the other night.   Patient has hospital follow up scheduled for 11/06/15 with the surgeon and on 12/11/15 with her primary care provider.  Her daughter is an emergency room nurse and has a great understanding of the diet Ms. Toxler needs to follow.  She is trying to limit the salt intake to between 1500 and 2000 mg.  She stated she is taking all the medications on the discharge summary.  She did not restart calcium or vitamin D due to them not being on the discharge summary.  Otherwise she does not have any questions regarding her mother's medications.   Objective: Outpatient Encounter Prescriptions as of 10/25/2015  Medication Sig Note  . acetaminophen (TYLENOL) 325 MG tablet Take 2 tablets (650 mg total) by mouth every 6 (six) hours as needed for mild pain. 10/25/2015: Only taking at bedtime  . amiodarone (PACERONE) 200 MG tablet Take 1 tablet (200 mg total) by mouth 2 (two) times daily.   [name] apixaban (ELIQUIS) 2.5 MG TABS tablet Take 1 tablet (2.5 mg total) by mouth 2 (two) times daily.   [name] aspirin EC 81 MG EC tablet Take 1 tablet (81 mg total) by mouth daily.   . CRESTOR 5 MG tablet TAKE ONE-HALF (1/2) TABLET DAILY   . ezetimibe (ZETIA) 10 MG tablet Take 1 tablet (10 mg total) by mouth daily.   . fish oil-omega-3 fatty acids 1000 MG capsule Take 1 g by  mouth daily.    . folic acid (FOLVITE) 1 MG tablet Take 1 tablet (1 mg total) by mouth daily.   . furosemide (LASIX) 40 MG tablet Take 1 tablet (40 mg total) by mouth 2 (two) times daily.   [name] glucosamine-chondroitin 500-400 MG tablet Take 1 tablet by mouth 3 (three) times daily.  04/26/2015: Taking 1 Daily  . levothyroxine (SYNTHROID, LEVOTHROID) 100 MCG tablet TAKE 1 TABLET (100 MCG TOTAL) BY MOUTH DAILY BEFORE BREAKFAST.   . Melatonin 5 MG CAPS Take 2.5 mg by mouth at bedtime as needed (sleep).    . metoprolol tartrate (LOPRESSOR) 25 MG tablet Take 1 tablet (25 mg total) by mouth 2 (two) times daily.   . MULTIPLE VITAMIN PO Take 1 tablet by mouth daily. 50+ Senior Vitamin Daily   . omeprazole (PRILOSEC) 40 MG capsule TAKE 1 CAPSULE DAILY   . Polyethyl Glycol-Propyl Glycol (SYSTANE OP) Place 1 drop into both eyes 3 (three) times daily as needed (dry eyes). Use 1-2 Drops in both eyes 04/26/2015: Using PRN  . potassium chloride SA (K-DUR,KLOR-CON) 20 MEQ tablet Take 1 tablet (20 mEq total) by mouth 2 (two) times daily.   . vitamin E 400 UNIT capsule Take 400 Units by mouth daily.   [name] azelastine (ASTELIN) 0.1 % nasal spray Place 2 sprays into both nostrils 2 (two) times daily. Use in each nostril as directed (Patient not taking: Reported  on 09/30/2015)   . SALINE NASAL SPRAY NA Place 1 spray into both nostrils daily as needed (congestion).  04/26/2015: Using PRN   No facility-administered encounter medications on file as of 10/25/2015.   Assessment:  Drugs sorted by system:  Neurologic/Psychologic: none  Cardiovascular: metoprolol tartrate, aspirin, eliquis, fish oil, amiodarone, furosemide, rosuvastatin, zetia  Pulmonary/Allergy: none  Gastrointestinal: omeprazole  Endocrine: levothyroxine  Renal: none  Topical: none  Pain: acetaminophen  Vitamins/Minerals: multivitamin, vita in E, folic acid, potassium  Infectious Diseases: none  Miscellaneous: glucosamine-chondrointin,  melatonin, Systane,    Duplications in therapy: none Gaps in therapy: none Medications to avoid in the elderly: none Drug interactions: eliquis and aspirin Other issues noted: did not restart vitamin D and calcium and has osteoporosis.    Plan: 1. Medication review: All medications appear to be appropriate based on patient's problem list. Patient recently initiated on Eliquis while in the hospital due to A. Fib.  Her dose of aspirin was lowered.  Together, Eliquis and aspirin may increase the potential for a bleed.  I believe benefit outweighs risk at this point in time. The daughter is well aware of bleeding signs and symptoms and is monitoring her mother.   2. Medication adherence:Currently not taking calcium or vitamin d despite being on them prior to the hospitalization and no indication for stopping them.  Per the daughter, she has not restarted them due to them not being on the discharge medication list.  With her history of osteoporosis, she would benefit from being on both.  Will send a fax to Dr. Regis Bill asking if calcium and vitamin D need to be restarted.    3. I instructed the daughter that if she has any more questions, she may call me.    Deanne Coffer, PharmD, [place] [phone]

## 2015-10-29 ENCOUNTER — Other Ambulatory Visit: Payer: Self-pay

## 2015-10-29 NOTE — Patient Outreach (Signed)
[name]) Care Management  10/29/2015  Amy STELLMACH [birth year] [id number]   SUBJECTIVE: Telephone call to patient for EMMI stroke red referral.  HIPAA verified with patient. Discussed EMMI heart failure transition follow up with patient. Patient verbally agreed to follow up with this RNCM .  Patient states she has been home from the hospital approximately 1 week.  Patient states she is weighing daily and recording weights. Patient denies any shortness of breath or swelling today. Patient states she had a visit from [place] home health on 10/25/15.  Patient states her services are suppose to start today but she is unsure which services she will have with [place].   Patient states she has no interest in doing things.  Patient states she has some difficulty with focusing on her television programs that she normally likes to watch. Patient states its difficult for her to follow the plot. Patient states she may be having some feelings of anxiety and/or depression. Patient denies taking any anxiety and or depression medication.  Patient states she wants to see how she does on her own before receiving any assistance for anxiety/depression feelings.  Patient request I speak with her daughter Amy Fischer regarding additional questions. Patient verbally gave permission for [facility] to speak with daughter Amy Fischer regarding any of her health information.  Patient states her daughter is a Marine scientist.   Daughter states she feels her mother has some anxiety related to the shortness of breath she experienced when she was admitted to the hospital. Daughter states she feels patient is doing slightly better with this. Daughter states patient has restless leg symptoms. States patient gets up and walks as much as possible to alleviate symptoms.  Daughter states patient had a mitral valve repair during her recent inpatient stay.  Daughter states patients incision looks good.  Denies any drainage, redness, or  swelling at incision site. Daughter states steri strips are locate at incision site.  Daughter states patient is not exhibiting any signs of swelling or shortness of breath related to heart failure symptoms.  Daughter denies patient having information related to heart failure action plan/zones.  Daughter states patient was instructed to contact doctor if experiencing heart failure related symptoms. Daughter states patient has not followed up with her primary MD but has followed up with the cardio thoracic doctors since discharge.  States patients next follow up appointment with cardio thoracic doctors is 11/20/15.  Daughter states patient has an appointment scheduled with her primary MD on 12/11/15.  Daughter states patient has a follow up appointment with her cardiologist on 11/06/15. Daughter unsure of name of cardiologist at this time. Daughter states cardiologist is with Dr. Jacalyn Lefevre group.  Daughter confirms home health services are suppose to start for patient on today 10/29/15.  Daughter states she doesn't think patient will need nursing services.  Daughter verifies she is a Marine scientist and states patients surgical site looks good.  RNCM advised patients daughter on importance and recommended guidelines of patient following up with her primary MD within 7 to 14 business days of discharge from hospital.  RNCM discussed with patients daughter heart failure action plan and zones.  Daughter agreed to receive education material related to heart failure action plan and doctor follow up for patient.  RNCM offered [facility] care management social worker follow up due to depression screening results.  Patient refused services at this time. Patient/ daughter voiced understanding that social work follow up through [facility] care management is available if needed.  RNCM  reviewed signs and symptoms of infection with patients daughter.  Daughter aware of symptoms.  Patient/daughter agreed to next telephone outreach with patient.    ASSESSMENT: EMMI stroke transition program initiated.   Patients PHQ-2 screen is 5 PHQ 9 screen 14. Results for depression screen will be sent to patients primary MD.  Patient with recent hospitalization 03/06/87 for diastolic heart failure, discharged on 10/22/15. Patient also had mitral valve repair during this hospitalizations.   PLAN: RNCM will follow up with patient within 1 week. RNCM will send patient heart failure action plan/zone magnet and EMMI education related to heart failure. RNCM will send patients primary MD depression screening results.  RNCM will discuss heart failure action plan/zones with patient at next outreach.   [name] RN,BSN,CCM [place] Coordinator [phone]

## 2015-10-29 NOTE — Patient Outreach (Signed)
[facility]) Care Management  10/29/2015  Amy Fischer 12-10-1929 [id number]   Triggered RED on EMMI Heart Failure dashboard, assigned [name], RN to outreach for [name] services.  Thanks, Ronnell Freshwater. [place], [place] Assistant Phone: [phone] Fax: [phone]

## 2015-10-30 DIAGNOSIS — I480 Paroxysmal atrial fibrillation: Secondary | ICD-10-CM | POA: Diagnosis not present

## 2015-10-30 DIAGNOSIS — Z48812 Encounter for surgical aftercare following surgery on the circulatory system: Secondary | ICD-10-CM | POA: Diagnosis not present

## 2015-10-30 DIAGNOSIS — I10 Essential (primary) hypertension: Secondary | ICD-10-CM | POA: Diagnosis not present

## 2015-10-30 DIAGNOSIS — M810 Age-related osteoporosis without current pathological fracture: Secondary | ICD-10-CM | POA: Diagnosis not present

## 2015-10-30 DIAGNOSIS — I5033 Acute on chronic diastolic (congestive) heart failure: Secondary | ICD-10-CM | POA: Diagnosis not present

## 2015-10-30 DIAGNOSIS — M1990 Unspecified osteoarthritis, unspecified site: Secondary | ICD-10-CM | POA: Diagnosis not present

## 2015-10-31 DIAGNOSIS — Z48812 Encounter for surgical aftercare following surgery on the circulatory system: Secondary | ICD-10-CM | POA: Diagnosis not present

## 2015-10-31 DIAGNOSIS — I5033 Acute on chronic diastolic (congestive) heart failure: Secondary | ICD-10-CM | POA: Diagnosis not present

## 2015-10-31 DIAGNOSIS — I480 Paroxysmal atrial fibrillation: Secondary | ICD-10-CM | POA: Diagnosis not present

## 2015-10-31 DIAGNOSIS — M810 Age-related osteoporosis without current pathological fracture: Secondary | ICD-10-CM | POA: Diagnosis not present

## 2015-10-31 DIAGNOSIS — I10 Essential (primary) hypertension: Secondary | ICD-10-CM | POA: Diagnosis not present

## 2015-10-31 DIAGNOSIS — M1990 Unspecified osteoarthritis, unspecified site: Secondary | ICD-10-CM | POA: Diagnosis not present

## 2015-10-31 DIAGNOSIS — Z23 Encounter for immunization: Secondary | ICD-10-CM | POA: Diagnosis not present

## 2015-11-02 DIAGNOSIS — I480 Paroxysmal atrial fibrillation: Secondary | ICD-10-CM | POA: Diagnosis not present

## 2015-11-02 DIAGNOSIS — I5033 Acute on chronic diastolic (congestive) heart failure: Secondary | ICD-10-CM | POA: Diagnosis not present

## 2015-11-02 DIAGNOSIS — M810 Age-related osteoporosis without current pathological fracture: Secondary | ICD-10-CM | POA: Diagnosis not present

## 2015-11-02 DIAGNOSIS — M1990 Unspecified osteoarthritis, unspecified site: Secondary | ICD-10-CM | POA: Diagnosis not present

## 2015-11-02 DIAGNOSIS — Z48812 Encounter for surgical aftercare following surgery on the circulatory system: Secondary | ICD-10-CM | POA: Diagnosis not present

## 2015-11-02 DIAGNOSIS — I10 Essential (primary) hypertension: Secondary | ICD-10-CM | POA: Diagnosis not present

## 2015-11-05 DIAGNOSIS — I10 Essential (primary) hypertension: Secondary | ICD-10-CM | POA: Diagnosis not present

## 2015-11-05 DIAGNOSIS — M810 Age-related osteoporosis without current pathological fracture: Secondary | ICD-10-CM | POA: Diagnosis not present

## 2015-11-05 DIAGNOSIS — M1990 Unspecified osteoarthritis, unspecified site: Secondary | ICD-10-CM | POA: Diagnosis not present

## 2015-11-05 DIAGNOSIS — I5033 Acute on chronic diastolic (congestive) heart failure: Secondary | ICD-10-CM | POA: Diagnosis not present

## 2015-11-05 DIAGNOSIS — I480 Paroxysmal atrial fibrillation: Secondary | ICD-10-CM | POA: Diagnosis not present

## 2015-11-05 DIAGNOSIS — Z48812 Encounter for surgical aftercare following surgery on the circulatory system: Secondary | ICD-10-CM | POA: Diagnosis not present

## 2015-11-06 ENCOUNTER — Ambulatory Visit (INDEPENDENT_AMBULATORY_CARE_PROVIDER_SITE_OTHER): Payer: Medicare Other | Admitting: Physician Assistant

## 2015-11-06 ENCOUNTER — Encounter: Payer: Self-pay | Admitting: Physician Assistant

## 2015-11-06 VITALS — BP 128/60 | HR 77 | Ht 62.0 in | Wt 111.7 lb

## 2015-11-06 DIAGNOSIS — Z7901 Long term (current) use of anticoagulants: Secondary | ICD-10-CM

## 2015-11-06 DIAGNOSIS — I480 Paroxysmal atrial fibrillation: Secondary | ICD-10-CM

## 2015-11-06 DIAGNOSIS — I5032 Chronic diastolic (congestive) heart failure: Secondary | ICD-10-CM | POA: Diagnosis not present

## 2015-11-06 DIAGNOSIS — Z79899 Other long term (current) drug therapy: Secondary | ICD-10-CM | POA: Diagnosis not present

## 2015-11-06 DIAGNOSIS — Z9889 Other specified postprocedural states: Secondary | ICD-10-CM

## 2015-11-06 LAB — CBC
HEMATOCRIT: 40.6 % (ref 36.0–46.0)
Hemoglobin: 13.2 g/dL (ref 12.0–15.0)
MCH: 31.4 pg (ref 26.0–34.0)
MCHC: 32.5 g/dL (ref 30.0–36.0)
MCV: 96.4 fL (ref 78.0–100.0)
MPV: 10.6 fL (ref 8.6–12.4)
Platelets: 236 10*3/uL (ref 150–400)
RBC: 4.21 MIL/uL (ref 3.87–5.11)
RDW: 16.7 % — AB (ref 11.5–15.5)
WBC: 6.6 10*3/uL (ref 4.0–10.5)

## 2015-11-06 LAB — BASIC METABOLIC PANEL
BUN: 29 mg/dL — AB (ref 7–25)
CHLORIDE: 104 mmol/L (ref 98–110)
CO2: 26 mmol/L (ref 20–31)
CREATININE: 0.89 mg/dL — AB (ref 0.60–0.88)
Calcium: 10.3 mg/dL (ref 8.6–10.4)
GLUCOSE: 89 mg/dL (ref 65–99)
POTASSIUM: 5.3 mmol/L (ref 3.5–5.3)
Sodium: 142 mmol/L (ref 135–146)

## 2015-11-06 MED ORDER — AMIODARONE HCL 200 MG PO TABS: 200.0000 mg | ORAL_TABLET | Freq: Every day | ORAL | Status: DC

## 2015-11-06 MED ORDER — FUROSEMIDE 40 MG PO TABS: 40.0000 mg | ORAL_TABLET | Freq: Every day | ORAL | Status: DC

## 2015-11-06 MED ORDER — METOPROLOL SUCCINATE ER 50 MG PO TB24: 50.0000 mg | ORAL_TABLET | Freq: Every day | ORAL | Status: DC

## 2015-11-06 MED ORDER — POTASSIUM CHLORIDE CRYS ER 20 MEQ PO TBCR: 20.0000 meq | EXTENDED_RELEASE_TABLET | Freq: Every day | ORAL | Status: DC

## 2015-11-06 NOTE — Patient Instructions (Signed)
Medication Instructions:  DECREASE Amiodarone to 200 mg ONCE DAILY. DECREASE Furosemide to 40 mg ONCE DAILY. DECREASE Potassium to 20 mEq ONCE DAILY. STOP Metoprolol Tartrate. START Metoprolol Succinate 50 mg - take 1 tablet by mouth once daily. New prescriptions have been sent to your pharmacy, [facility], electronically.  Labwork: Your physician recommends that you return for lab work TODAY.  Testing/Procedures: NONE  Follow-Up: Rosaria Ferries, PA-C, recommends that you schedule a follow-up appointment in 3 months with Dr Stanford Breed.  If you need a refill on your cardiac medications before your next appointment, please call your pharmacy.

## 2015-11-06 NOTE — Progress Notes (Signed)
Cardiology Office Note   Date:  11/06/2015   ID:  [name], DOB February 25, 1929, MRN [id number]  PCP:  [name], MD  Cardiologist:  Dr Alcide Evener, PA-C   Chief Complaint  Patient presents with  . Follow-up    Mitral Valve repair//pt c/o SOB on exertion and slight loss of balance.    History of Present Illness: Amy Fischer is a 81 y.o. female with a history of HL, hypothyroid, subclavian steal s/p L carotid>>L subclavian, admitted 10/02 for SOB, CHF, MR s/p MV repair, afib, started on Eliquis and C diff. D/c 10/24  Amy Fischer presents for post hospital follow-up.  Since discharge from the hospital, she has done very well. Her lower extremity edema has resolved. Her dyspnea on exertion has improved. Her weight is now below her preadmission weight. She has been staying with her daughter who helps manage her medications. The daughter also feels like her mother is doing extremely well. The mother is looking forward to being able to live independently again. She has no palpitations, no awareness of any atrial fibrillation. She is tolerating the Eliquis well. She feels that she is gaining strength and not in danger of falling.   Past Medical History  Diagnosis Date  . GERD (gastroesophageal reflux disease)   . Hyperlipidemia   . Hypothyroidism   . Osteoarthritis   . Osteoporosis   . Episodic recurrent vertigo     MRI Head 2003  . Allergy   . Bladder polyps   . Subclavian steal syndrome     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; CTA 2013 revealed open graft  . Hyperplastic colon polyp   . Esophageal spasm   . History of hepatitis     unknown type  . Asymptomatic carotid artery stenosis     R ICA 40% stenosis on CTA 12/2011.  [name] Anemia   . Cataract     BILATERAL-REMOVED  . Elevated blood pressure 03/25/2011    Bp readings borderline today has hx of elvation  In office and ok at home   Not checke recently   She gfeels was elevated from  anxiety    . Diverticulosis   . Intestinal metaplasia of gastric mucosa   . Hepatic hemangioma     Past Surgical History  Procedure Laterality Date  . Appendectomy    . Cholecystectomy    . Carotid-subclavian bypass graft Left 1999    for Hewitt steal syndrome  . Rotator cuff repair Left   . Elbow surgery Left   . Cystectomy Left     hand  . Tubal ligation    . Tonsillectomy    . Colonoscopy    . Tear duct probing  07/2014  . Knee surgery Left 2014  . Tee without cardioversion N/A 10/03/2015    Procedure: TRANSESOPHAGEAL ECHOCARDIOGRAM (TEE);  Surgeon: Larey Dresser, MD;  Location: [facility];  Service: Cardiovascular;  Laterality: N/A;  . Cardiac catheterization N/A 10/04/2015    Procedure: Right/Left Heart Cath and Coronary Angiography;  Surgeon: Belva Crome, MD;  Location: [place] CV LAB;  Service: Cardiovascular;  Laterality: N/A;  . Mitral valve repair N/A 10/10/2015    Procedure: MITRAL VALVE REPAIR (MVR) WITH SIZE 28 CARPENTIER-EDWARDS PHYSIO II ANNULOPLASTY RING;  Surgeon: [name], MD;  Location: [facility];  Service: Open Heart Surgery;  Laterality: N/A;  . Tee without cardioversion N/A 10/10/2015    Procedure: TRANSESOPHAGEAL ECHOCARDIOGRAM (TEE);  Surgeon: Remo Lipps  Chaya Jan, MD;  Location: [facility];  Service: Open Heart Surgery;  Laterality: N/A;    Current Outpatient Prescriptions  Medication Sig Dispense Refill  . acetaminophen (TYLENOL) 325 MG tablet Take 2 tablets (650 mg total) by mouth every 6 (six) hours as needed for mild pain.    [name] amiodarone (PACERONE) 200 MG tablet Take 1 tablet (200 mg total) by mouth 2 (two) times daily. 60 tablet 1  . apixaban (ELIQUIS) 2.5 MG TABS tablet Take 1 tablet (2.5 mg total) by mouth 2 (two) times daily. 60 tablet 1  . aspirin EC 81 MG EC tablet Take 1 tablet (81 mg total) by mouth daily.    [name] azelastine (ASTELIN) 0.1 % nasal spray Place 2 sprays into both nostrils 2 (two) times daily. Use in each nostril as directed  (Patient not taking: Reported on 09/30/2015) 30 mL 3  . CRESTOR 5 MG tablet TAKE ONE-HALF (1/2) TABLET DAILY 90 tablet 0  . ezetimibe (ZETIA) 10 MG tablet Take 1 tablet (10 mg total) by mouth daily. 30 tablet 0  . fish oil-omega-3 fatty acids 1000 MG capsule Take 1 g by mouth daily.     . folic acid (FOLVITE) 1 MG tablet Take 1 tablet (1 mg total) by mouth daily. 30 tablet 1  . furosemide (LASIX) 40 MG tablet Take 1 tablet (40 mg total) by mouth 2 (two) times daily. 60 tablet 1  . glucosamine-chondroitin 500-400 MG tablet Take 1 tablet by mouth 3 (three) times daily.     [name] levothyroxine (SYNTHROID, LEVOTHROID) 100 MCG tablet TAKE 1 TABLET (100 MCG TOTAL) BY MOUTH DAILY BEFORE BREAKFAST. 90 tablet 1  . Melatonin 5 MG CAPS Take 2.5 mg by mouth at bedtime as needed (sleep).     . metoprolol tartrate (LOPRESSOR) 25 MG tablet Take 1 tablet (25 mg total) by mouth 2 (two) times daily. 60 tablet 1  . MULTIPLE VITAMIN PO Take 1 tablet by mouth daily. 50+ Senior Vitamin Daily    . omeprazole (PRILOSEC) 40 MG capsule TAKE 1 CAPSULE DAILY 90 capsule 1  . Polyethyl Glycol-Propyl Glycol (SYSTANE OP) Place 1 drop into both eyes 3 (three) times daily as needed (dry eyes). Use 1-2 Drops in both eyes    . potassium chloride SA (K-DUR,KLOR-CON) 20 MEQ tablet Take 1 tablet (20 mEq total) by mouth 2 (two) times daily. 60 tablet 1  . SALINE NASAL SPRAY NA Place 1 spray into both nostrils daily as needed (congestion).     . vitamin E 400 UNIT capsule Take 400 Units by mouth daily.     No current facility-administered medications for this visit.    Allergies:   Codeine; Risedronate sodium; Statins; and Augmentin    Social History:  The patient  reports that she quit smoking about 24 years ago. She has never used smokeless tobacco. She reports that she drinks about 2.4 oz of alcohol per week. She reports that she does not use illicit drugs.   Family History:  The patient's family history includes Heart disease in her  father; Hypertension in her mother; Stroke in her mother. There is no history of Colon cancer.    ROS:  Please see the history of present illness. All other systems are reviewed and negative.    PHYSICAL EXAM: VS:  BP 128/60 mmHg  Pulse 77  Ht 5\' 2"  (1.575 m)  Wt 111 lb 11.2 oz (50.667 kg)  BMI 20.43 kg/m2 , BMI Body mass index is 20.43 kg/(m^2). GEN: Well  nourished, slender, elderly, female in no acute distress HEENT: normal for age  Neck: no JVD, no carotid bruit, no masses Cardiac: RRR; soft murmur, no rubs, or gallops Respiratory:  clear to auscultation bilaterally, normal work of breathing GI: soft, nontender, nondistended, + BS MS: no deformity or atrophy; no edema; distal pulses are 2+ in all 4 extremities  Skin: warm and dry, no rash Neuro:  Strength and sensation are intact Psych: euthymic mood, full affect   EKG:  EKG is ordered today. The ekg ordered today demonstrates sinus rhythm   Recent Labs: 09/30/2015: B Natriuretic Peptide 460.6*; TSH 0.586 10/11/2015: Magnesium 2.3 10/15/2015: ALT 11* 10/21/2015: Hemoglobin 10.3*; Platelets 185 10/22/2015: BUN 11; Creatinine, Ser 0.78; Potassium 3.9; Sodium 136    Lipid Panel    Component Value Date/Time   CHOL 163 07/19/2014 0850   TRIG 130.0 07/19/2014 0850   HDL 63.00 07/19/2014 0850   CHOLHDL 3 07/19/2014 0850   VLDL 26.0 07/19/2014 0850   LDLCALC 74 07/19/2014 0850     Wt Readings from Last 3 Encounters:  11/06/15 111 lb 11.2 oz (50.667 kg)  10/29/15 108 lb (48.988 kg)  10/22/15 121 lb 3.2 oz (54.976 kg)     Other studies Reviewed: Additional studies/ records that were reviewed today include: Hospital records, ECG and office notes.  ASSESSMENT AND PLAN:  1.  Paroxysmal atrial fibrillation: She is maintaining sinus rhythm. We will decrease her amiodarone to 200 mg daily. She is to continue the Eliquis.  2. Chronic diastolic CHF: We will decrease the Lasix to 40 mg daily, and decrease the potassium as  well. She wishes to take her beta blocker only once daily and so we will change the beta blocker to Toprol-XL 20 mg  3. Chronic anticoagulation with Eliquis This patients CHA2DS2-VASc Score and unadjusted Ischemic Stroke Rate (% per year) is equal to 7.2 % stroke rate/year from a score of 5 Above score calculated as 1 point each if present [CHF, HTN, DM, Vascular=MI/PAD/Aortic Plaque, Age if 65-74, or Female], 2 points each if present [Age > 75, or Stroke/TIA/TE]. She is tolerating the Eliquis well, continue this   Current medicines are reviewed at length with the patient today.  The patient does not have concerns regarding medicines.  The following changes have been made:  Changes are described above  Labs/ tests ordered today include:   Orders Placed This Encounter  Procedures  . Basic metabolic panel  . CBC  . EKG 12-Lead     Disposition:   FU with Dr. Stanford Breed  Signed, Lenoard Aden  11/06/2015 11:38 AM    [place] [address], [place], [place]  [address] Phone: [phone]; Fax: [phone]

## 2015-11-07 ENCOUNTER — Other Ambulatory Visit: Payer: Self-pay

## 2015-11-07 ENCOUNTER — Telehealth: Payer: Self-pay | Admitting: Cardiology

## 2015-11-07 DIAGNOSIS — I5033 Acute on chronic diastolic (congestive) heart failure: Secondary | ICD-10-CM | POA: Diagnosis not present

## 2015-11-07 DIAGNOSIS — I10 Essential (primary) hypertension: Secondary | ICD-10-CM | POA: Diagnosis not present

## 2015-11-07 DIAGNOSIS — Z48812 Encounter for surgical aftercare following surgery on the circulatory system: Secondary | ICD-10-CM | POA: Diagnosis not present

## 2015-11-07 DIAGNOSIS — M1990 Unspecified osteoarthritis, unspecified site: Secondary | ICD-10-CM | POA: Diagnosis not present

## 2015-11-07 DIAGNOSIS — I480 Paroxysmal atrial fibrillation: Secondary | ICD-10-CM | POA: Diagnosis not present

## 2015-11-07 DIAGNOSIS — M810 Age-related osteoporosis without current pathological fracture: Secondary | ICD-10-CM | POA: Diagnosis not present

## 2015-11-07 MED ORDER — METOPROLOL SUCCINATE ER 50 MG PO TB24: 50.0000 mg | ORAL_TABLET | Freq: Every day | ORAL | Status: DC

## 2015-11-07 NOTE — Telephone Encounter (Signed)
... °*  STAT* If patient is at the pharmacy, call can be transferred to refill team.   1. Which medications need to be refilled? (please list name of each medication and dose if known) Metoprolol 50mg   2. Which pharmacy/location (including street and city if local pharmacy) is medication to be sent to?[facility] [address] in [place]   3. Do they need a 30 day or 90 day supply? [place]

## 2015-11-07 NOTE — Telephone Encounter (Signed)
Rx(s) sent to pharmacy electronically.  

## 2015-11-07 NOTE — Addendum Note (Signed)
Addended by: Diana Eves on: 11/07/2015 09:34 AM   Modules accepted: Orders

## 2015-11-09 DIAGNOSIS — M810 Age-related osteoporosis without current pathological fracture: Secondary | ICD-10-CM | POA: Diagnosis not present

## 2015-11-09 DIAGNOSIS — I5033 Acute on chronic diastolic (congestive) heart failure: Secondary | ICD-10-CM | POA: Diagnosis not present

## 2015-11-09 DIAGNOSIS — M1990 Unspecified osteoarthritis, unspecified site: Secondary | ICD-10-CM | POA: Diagnosis not present

## 2015-11-09 DIAGNOSIS — Z48812 Encounter for surgical aftercare following surgery on the circulatory system: Secondary | ICD-10-CM | POA: Diagnosis not present

## 2015-11-09 DIAGNOSIS — I10 Essential (primary) hypertension: Secondary | ICD-10-CM | POA: Diagnosis not present

## 2015-11-09 DIAGNOSIS — I480 Paroxysmal atrial fibrillation: Secondary | ICD-10-CM | POA: Diagnosis not present

## 2015-11-09 NOTE — Patient Outreach (Addendum)
[facility]) Care Management  11/09/2015  Jiovanna Drollinger Huttner 1929/04/12 [id number]   Late entry for 11/07/15.  Subjective: Telephone call to patient regarding EMMI stroke transition program. HIPAA verfied with patients daughter.  Patient has given permission to speak with her daughter, [name].  Daughter states patient is doing better. Daughter states patient had follow up with her cardiologist on 11/06/15.  States patient's blood pressure has been stable running 120's/ 70-80. Daughter states patient continues with [place] physical therapy  3x week for increased strenght training.  Daughter states patients's depression is getting better.  Daughter states patients bridge club will be visiting and friends are coming by.  Daughter states patient seems to be enjoying the company.  Daughter states patient cooked today as well. States patients appetite is good.  Daughter states patient is well enough to be left alone for a few hours. Daughter states patient is now back in her bed now instead of being on the sofa. States patients feelings of panic has resolved.  Daughter states she reviewed the COPD action plan with patient and patient states she is in the Anzley Dibbern zone today. Daughter denies patient having any abnormal swelling or signs/symptoms today.  Daughter states she reviews patients medications with her every Monday.  States she goes over what each medication is for.  Daughter states patients next follow up visit with her primary MD is December 2016.  Daughter states patient still has some shortness of breath when she walks long distances. States patient uses walker periodically but does not use in the house. Daughter states she is encouraging patient to maintain a low salt diet.  RNCM advised importance of patient taking medication as prescribed.  RNCM advised of importance of knowing yellow symptoms and when to contact doctor.   ASSESSMENT: EMMI heart failure program.  Daughter  assisting patient well with heart failure management.   PLAN: RNCM will follow up with patient and/or daughter within 1 week.   [name] RN,BSN,CCM [place] Coordinator [phone]

## 2015-11-12 DIAGNOSIS — H04222 Epiphora due to insufficient drainage, left lacrimal gland: Secondary | ICD-10-CM | POA: Diagnosis not present

## 2015-11-12 DIAGNOSIS — J3481 Nasal mucositis (ulcerative): Secondary | ICD-10-CM | POA: Diagnosis not present

## 2015-11-13 ENCOUNTER — Other Ambulatory Visit: Payer: Medicare Other

## 2015-11-14 ENCOUNTER — Other Ambulatory Visit: Payer: Self-pay

## 2015-11-14 DIAGNOSIS — M1990 Unspecified osteoarthritis, unspecified site: Secondary | ICD-10-CM | POA: Diagnosis not present

## 2015-11-14 DIAGNOSIS — M810 Age-related osteoporosis without current pathological fracture: Secondary | ICD-10-CM | POA: Diagnosis not present

## 2015-11-14 DIAGNOSIS — I480 Paroxysmal atrial fibrillation: Secondary | ICD-10-CM | POA: Diagnosis not present

## 2015-11-14 DIAGNOSIS — Z48812 Encounter for surgical aftercare following surgery on the circulatory system: Secondary | ICD-10-CM | POA: Diagnosis not present

## 2015-11-14 DIAGNOSIS — I5033 Acute on chronic diastolic (congestive) heart failure: Secondary | ICD-10-CM | POA: Diagnosis not present

## 2015-11-14 DIAGNOSIS — I10 Essential (primary) hypertension: Secondary | ICD-10-CM | POA: Diagnosis not present

## 2015-11-14 NOTE — Patient Outreach (Signed)
[facility]) Care Management  11/14/2015  Amy Fischer 12-11-1929 [id number]   Patient triggered RED on EMMI Heart Failure dashboard, notification sent to [name], RN.  Thanks, Ronnell Freshwater. [place], [place] Assistant Phone: [phone] Fax: [phone]

## 2015-11-14 NOTE — Patient Outreach (Signed)
[name]) Care Management  11/14/2015  Amy Fischer [birth year] [id number]   SUBJECTIVE: Telephone call to patient regarding EMMI heart failure red referral.  HIPAA verified with patient.  Patient states she is doing better. Patient states she did not answer the EMMI automated phone call question concerning her weight correctly.  Patient states she recently got new digital scales for home use and the scales are weighing her several pounds higher than her previous scale.  Patient states her previous scale was very old and not a digital. Patient states she was concerned about answering the question due to the increase weight.  Patient denies any swelling in her feet, legs, hands or abdomen. Patient denies any unusual signs or symptoms or increased shortness of breath.  Patient states she feels she is in the [name] zone. RNCM advised patient to continue to weigh and record her weights and answer the EMMI stroke question with her new scale readings.  Patient states she has a follow up visit with her primary MD on 12/11/15.  Patient state she is to have lab work drawn on 12/04/15.   Patient states she is having some problems with her balance. Patient states she is having trouble walking straight.  Patient reports she fells approximately 1 week ago. States she was picking up something and fell over.  Patient denies any injury.  Patient states she has always had a slight problem with her balance but she has noticed an increase since having the mitral valve repair surgery.  Patient states she discussed this with her home health therapist.  Patient states her therapist advised her to start using her cane and therapist has worked with her in using cane. Patient states this has helped her considerably with her balance. Patient states she denies feeling weak or sick but she was not comfortable walking until she started using her cane. Patient states she has been given balance exercises to do at her  sink and at the bed.  [name]  RNCM advised patient regarding fall safety prevention methods.  RNCM advised patient to remove throw rugs which could be potential fall hazards. RNCM advised patient to make sure she has good lighting and night lights in hall ways and bathrooms.  RNCM advised patient to sit on the side of bed before getting up to walk.  Patient verbalized understanding.   Patient states she is doing much better regarding her depression. Patient states she is having visitors at home which she is enjoying. Patient states she is still having some trouble concentration but she feels it is getting better.   Patient states she has signed up for cardiac rehab.  States she is awaiting an appointment date and time.   ASSESSMENT: EMMI heart failure red referral.   PLAN; RNCM will follow up with patient within 1 week Patient will report appointment date for cardiac rehab.  RNCM will send patient fall prevention EMMII article. RNCM will send patients primary MD involvement letter. RNCM will send patient [facility] care management packet and consent form.  [name] RN,BSN,CCM [place] Coordinator [phone]

## 2015-11-15 ENCOUNTER — Ambulatory Visit: Payer: Self-pay

## 2015-11-15 DIAGNOSIS — I10 Essential (primary) hypertension: Secondary | ICD-10-CM | POA: Diagnosis not present

## 2015-11-15 DIAGNOSIS — Z48812 Encounter for surgical aftercare following surgery on the circulatory system: Secondary | ICD-10-CM | POA: Diagnosis not present

## 2015-11-15 DIAGNOSIS — M810 Age-related osteoporosis without current pathological fracture: Secondary | ICD-10-CM | POA: Diagnosis not present

## 2015-11-15 DIAGNOSIS — M1990 Unspecified osteoarthritis, unspecified site: Secondary | ICD-10-CM | POA: Diagnosis not present

## 2015-11-15 DIAGNOSIS — I5033 Acute on chronic diastolic (congestive) heart failure: Secondary | ICD-10-CM | POA: Diagnosis not present

## 2015-11-15 DIAGNOSIS — I480 Paroxysmal atrial fibrillation: Secondary | ICD-10-CM | POA: Diagnosis not present

## 2015-11-16 ENCOUNTER — Telehealth: Payer: Self-pay | Admitting: *Deleted

## 2015-11-16 ENCOUNTER — Other Ambulatory Visit: Payer: Self-pay

## 2015-11-16 ENCOUNTER — Other Ambulatory Visit: Payer: Self-pay | Admitting: *Deleted

## 2015-11-16 DIAGNOSIS — I10 Essential (primary) hypertension: Secondary | ICD-10-CM | POA: Diagnosis not present

## 2015-11-16 DIAGNOSIS — I5033 Acute on chronic diastolic (congestive) heart failure: Secondary | ICD-10-CM | POA: Diagnosis not present

## 2015-11-16 DIAGNOSIS — M1990 Unspecified osteoarthritis, unspecified site: Secondary | ICD-10-CM | POA: Diagnosis not present

## 2015-11-16 DIAGNOSIS — I480 Paroxysmal atrial fibrillation: Secondary | ICD-10-CM | POA: Diagnosis not present

## 2015-11-16 DIAGNOSIS — Z48812 Encounter for surgical aftercare following surgery on the circulatory system: Secondary | ICD-10-CM | POA: Diagnosis not present

## 2015-11-16 DIAGNOSIS — M810 Age-related osteoporosis without current pathological fracture: Secondary | ICD-10-CM | POA: Diagnosis not present

## 2015-11-16 NOTE — Patient Outreach (Signed)
Amy [facility]) Care Management  11/16/2015  FARM GREGORY [birth year] [id number]   EMMI-Heart Failure follow up;  Received dashboard done on 11/15/2015 with red in answers to weight, new problems/worsening problems and lightheaded or dizzy.   Telephone call to patient who gave HIPPA verification. Advised of reason for calling regarding her responses to EMMI call.  Subjective:   Patient reports that she had balance problem before leaving [facility] and feels that it has not improved. States she has not fallen and uses walker to steady her gait. States dizziness does not accompany her balance problems. States seeing home health physical therapist 3 times weekly. States she reported balance problem to therapist who was present on 11/15/15 and was instructed on strategies to improve balance. States home health physical therapist will come again today.  States therapist is taking her blood pressure at each visit but she does not know what values are.  Advised patient to continue to follow instruction of home health physical therapist and report to doctor as needed. States she has appointment with primary care next week.  Patient reports that she is not feeling any shortness of breath, having no dizziness or nausea, no swelling or pain. States she has new scales and calibration is different from previous scales. States weight on 11/17 was 113 pounds  and today weight is 112 pounds. States no problem with weight gain. Voices that she is aware of when to report weight gain to MD. Also aware of calling 911 when having symptoms in red zone of heart failure action plan.   Patient reports that she is manages own medications and consistent with taking them as prescribed by her doctor. States she received instructions today from doctor's office (cardiologist) to take potassium every other day.   Objective: Medication review as noted.  Assessment: Continues to receive home health  services. Problem with balance. Using walker. No falls. Patient has reported to home physical therapist who is seeing patient 3 times weekly. Change in frequency of taking potassium   Plan:  Follow care plan as noted. Follow up next week.    Amy Daisy, RN BSN CCM (covering for [name]) Care Management Coordinator [facility] Care Management  [phone]

## 2015-11-16 NOTE — Telephone Encounter (Signed)
-----   Message from Lonn Georgia, PA-C sent at 11/09/2015  5:19 PM EST ----- Please let her know the BMET was OK, and she can decrease her potassium supplement to every other day.

## 2015-11-16 NOTE — Telephone Encounter (Signed)
Called and notified patient of lab results and recommendations. She verbally voiced understanding.

## 2015-11-16 NOTE — Patient Outreach (Signed)
[facility]) Care Management  11/16/2015  Amy Fischer 02/07/1929 [id number]  SUBJECTIVE:  Telephone call to patient regarding EMMI heart failure red referral. HIPAA verified by patient.  Patient states her weight is 112lbs.  Patient states she has a new scale which is weighing her a few pounds more than the old scale she had. Patient states her old scale was not digital like her new scale.  Patient states she does not feel ill.  Patient denies shortness of breath or swelling in her feet, legs, hands, or abdomen. Patient states she took her diuretic pill today. Patient states she still continues to have issues with her balance. Patient states she does not feel dizzy or sick.  Patient states she is fine when she is sitting but staggers some when she is walking. Patient states she continues to use her cane for ambulation. Patient denies calling and reporting symptoms to her primary MD. RNCM advised patient to notify her primary MD office of symptoms. Primary MD office may want to have patient come in for follow up.  Patient verbalized understanding. Patient reports her next follow up appointment with her primary MD is 12/11/15.  Patient reports her daughter will check on her this weekend.  RNCM advised patient to contact primary MD office if notice heart failure symptoms worsening, increase weight gain of 3lbs overnight. RNCM instructed patient how to contact primary MD office after hours. Patient verbalized understanding. RNCM advised patient to call EMS if symptoms worsen to RED ZONE (struggling to breath, chest discomfort, feelings of faint) status.  Patient verbalized understanding.    ASSESSMENT: EMMI heart failure program.  PLAN; RNCM will follow up with patient on Monday 11/19/15 Patient will report contacting her doctors office to notify of balance symptoms.   [name] RN,BSN,CCM [place] Coordinator [phone]

## 2015-11-18 DIAGNOSIS — Z48812 Encounter for surgical aftercare following surgery on the circulatory system: Secondary | ICD-10-CM | POA: Diagnosis not present

## 2015-11-18 DIAGNOSIS — I10 Essential (primary) hypertension: Secondary | ICD-10-CM | POA: Diagnosis not present

## 2015-11-18 DIAGNOSIS — M810 Age-related osteoporosis without current pathological fracture: Secondary | ICD-10-CM | POA: Diagnosis not present

## 2015-11-18 DIAGNOSIS — I5033 Acute on chronic diastolic (congestive) heart failure: Secondary | ICD-10-CM | POA: Diagnosis not present

## 2015-11-18 DIAGNOSIS — I480 Paroxysmal atrial fibrillation: Secondary | ICD-10-CM | POA: Diagnosis not present

## 2015-11-18 DIAGNOSIS — M1990 Unspecified osteoarthritis, unspecified site: Secondary | ICD-10-CM | POA: Diagnosis not present

## 2015-11-19 ENCOUNTER — Other Ambulatory Visit: Payer: Self-pay

## 2015-11-19 ENCOUNTER — Other Ambulatory Visit: Payer: Self-pay | Admitting: Thoracic Surgery (Cardiothoracic Vascular Surgery)

## 2015-11-19 DIAGNOSIS — Z9889 Other specified postprocedural states: Secondary | ICD-10-CM

## 2015-11-20 ENCOUNTER — Ambulatory Visit: Payer: Medicare Other | Admitting: Internal Medicine

## 2015-11-20 ENCOUNTER — Encounter: Payer: Self-pay | Admitting: Thoracic Surgery (Cardiothoracic Vascular Surgery)

## 2015-11-20 ENCOUNTER — Ambulatory Visit
Admission: RE | Admit: 2015-11-20 | Discharge: 2015-11-20 | Disposition: A | Discharge status: Home / Self Care | Payer: Medicare Other | Source: Ambulatory Visit | Attending: Thoracic Surgery (Cardiothoracic Vascular Surgery) | Admitting: Thoracic Surgery (Cardiothoracic Vascular Surgery)

## 2015-11-20 ENCOUNTER — Encounter: Payer: Self-pay | Admitting: Internal Medicine

## 2015-11-20 ENCOUNTER — Ambulatory Visit (INDEPENDENT_AMBULATORY_CARE_PROVIDER_SITE_OTHER): Payer: Self-pay | Admitting: Thoracic Surgery (Cardiothoracic Vascular Surgery)

## 2015-11-20 ENCOUNTER — Telehealth: Payer: Self-pay | Admitting: Internal Medicine

## 2015-11-20 ENCOUNTER — Ambulatory Visit (INDEPENDENT_AMBULATORY_CARE_PROVIDER_SITE_OTHER): Payer: Medicare Other | Admitting: Internal Medicine

## 2015-11-20 ENCOUNTER — Other Ambulatory Visit: Payer: Self-pay

## 2015-11-20 VITALS — BP 122/72 | Temp 97.5°F | Ht 62.0 in | Wt 113.6 lb

## 2015-11-20 VITALS — BP 131/71 | HR 85 | Resp 16 | Ht 62.0 in | Wt 114.0 lb

## 2015-11-20 DIAGNOSIS — D696 Thrombocytopenia, unspecified: Secondary | ICD-10-CM | POA: Diagnosis not present

## 2015-11-20 DIAGNOSIS — I517 Cardiomegaly: Secondary | ICD-10-CM | POA: Diagnosis not present

## 2015-11-20 DIAGNOSIS — Z736 Limitation of activities due to disability: Secondary | ICD-10-CM

## 2015-11-20 DIAGNOSIS — E039 Hypothyroidism, unspecified: Secondary | ICD-10-CM

## 2015-11-20 DIAGNOSIS — Z9889 Other specified postprocedural states: Secondary | ICD-10-CM

## 2015-11-20 DIAGNOSIS — I480 Paroxysmal atrial fibrillation: Secondary | ICD-10-CM

## 2015-11-20 DIAGNOSIS — I10 Essential (primary) hypertension: Secondary | ICD-10-CM

## 2015-11-20 DIAGNOSIS — Z09 Encounter for follow-up examination after completed treatment for conditions other than malignant neoplasm: Secondary | ICD-10-CM | POA: Diagnosis not present

## 2015-11-20 DIAGNOSIS — R2681 Unsteadiness on feet: Secondary | ICD-10-CM | POA: Insufficient documentation

## 2015-11-20 DIAGNOSIS — Z9181 History of falling: Secondary | ICD-10-CM

## 2015-11-20 DIAGNOSIS — Z7901 Long term (current) use of anticoagulants: Secondary | ICD-10-CM

## 2015-11-20 DIAGNOSIS — IMO0001 Reserved for inherently not codable concepts without codable children: Secondary | ICD-10-CM

## 2015-11-20 DIAGNOSIS — I5032 Chronic diastolic (congestive) heart failure: Secondary | ICD-10-CM

## 2015-11-20 DIAGNOSIS — R54 Age-related physical debility: Secondary | ICD-10-CM

## 2015-11-20 DIAGNOSIS — I340 Nonrheumatic mitral (valve) insufficiency: Secondary | ICD-10-CM

## 2015-11-20 MED ORDER — APIXABAN 2.5 MG PO TABS: 2.5000 mg | ORAL_TABLET | Freq: Two times a day (BID) | ORAL | Status: DC

## 2015-11-20 MED ORDER — METOPROLOL TARTRATE 25 MG PO TABS: 25.0000 mg | ORAL_TABLET | Freq: Two times a day (BID) | ORAL | Status: DC

## 2015-11-20 MED ORDER — LEVOTHYROXINE SODIUM 100 MCG PO TABS: ORAL_TABLET | ORAL | Status: DC

## 2015-11-20 MED ORDER — FOLIC ACID 1 MG PO TABS: 1.0000 mg | ORAL_TABLET | Freq: Every day | ORAL | Status: DC

## 2015-11-20 NOTE — Patient Outreach (Signed)
[facility]) Care Management  11/20/2015  YONA BODLEY 1929-06-08 [id number]   Telephone call to patient for follow up.  Unable to reach patient. HIPAA compliant voice message left with call back phone number.   PLAN: RNCM will attempt telephone outreach to patient within 1 week.   [name] RN,BSN,CCM [place] Coordinator [phone]

## 2015-11-20 NOTE — Patient Instructions (Signed)
Keep appt  For lab in December  Make appt in January  For follow  Up.  Have gentiva  Call [facility] for an order for balance pt training .   Disc the bending over issues  Your last labs were good .  We refilled the blood thinner but future should come from cardiology .

## 2015-11-20 NOTE — Progress Notes (Signed)
[address]       [place], [address]             [phone]       HPI: Amy Fischer returns for a scheduled postop follow up visit.  Amy Fischer is an 81 year old woman who presented with congestive heart failure and acute respiratory failure. On presentation initially it was not clear whether she had congestive heart failure or pneumonia. She was treated with multiple antibiotics. Eventually she had an echocardiogram which showed severe mitral regurgitation due to a flail posterior leaflet.  She underwent mitral valve repair on 10/11/2015. Postop course was primarily complicated by diarrhea. She was found to have C. difficile colitis and treated initially with by mouth vancomycin and then Flagyl. That ultimately resolved. She was in atrial fibrillation time of presentation, was in sinus rhythm at time of discharge.  Since discharge she's been feeling well. She has not had any shortness of breath. She has minimal incisional pain, for which she takes Tylenol. She has not noticed any swelling in her legs. Her primary complaint is problems with her balance. Amy Fischer says that this started when they changed her Lopressor to Toprol-XL. She does not have any dizziness or weakness. She is having to walk with a cane.  Past Medical History  Diagnosis Date  . GERD (gastroesophageal reflux disease)   . Hyperlipidemia   . Hypothyroidism   . Osteoarthritis   . Osteoporosis   . Episodic recurrent vertigo     MRI Head 2003  . Allergy   . Bladder polyps   . Subclavian steal syndrome     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; CTA 2013 revealed open graft  . Hyperplastic colon polyp   . Esophageal spasm   . History of hepatitis     unknown type  . Asymptomatic carotid artery stenosis     R ICA 40% stenosis on CTA 12/2011.  Amy Fischer Anemia   . Cataract     BILATERAL-REMOVED  . Elevated blood pressure 03/25/2011    Bp readings borderline today has hx of elvation  In office  and ok at home   Not checke recently   She gfeels was elevated from anxiety    . Diverticulosis   . Intestinal metaplasia of gastric mucosa   . Hepatic hemangioma   . CHF (congestive heart failure) ([place])        Current Outpatient Prescriptions  Medication Sig Dispense Refill  . acetaminophen (TYLENOL) 325 MG tablet Take 2 tablets (650 mg total) by mouth every 6 (six) hours as needed for mild pain.    Amy Fischer apixaban (ELIQUIS) 2.5 MG TABS tablet Take 1 tablet (2.5 mg total) by mouth 2 (two) times daily. 60 tablet 1  . aspirin EC 81 MG EC tablet Take 1 tablet (81 mg total) by mouth daily.    . CRESTOR 5 MG tablet TAKE ONE-HALF (1/2) TABLET DAILY 90 tablet 0  . ezetimibe (ZETIA) 10 MG tablet Take 1 tablet (10 mg total) by mouth daily. 30 tablet 0  . fish oil-omega-3 fatty acids 1000 MG capsule Take 1 g by mouth daily.     . folic acid (FOLVITE) 1 MG tablet Take 1 tablet (1 mg total) by mouth daily. 30 tablet 1  . glucosamine-chondroitin 500-400 MG tablet Take 1 tablet by mouth 3 (three) times daily.     Amy Fischer levothyroxine (SYNTHROID, LEVOTHROID) 100 MCG tablet TAKE 1 TABLET (100 MCG TOTAL) BY MOUTH DAILY  BEFORE BREAKFAST. 90 tablet 1  . Melatonin 5 MG CAPS Take 2.5 mg by mouth at bedtime as needed (sleep).     . MULTIPLE VITAMIN PO Take 1 tablet by mouth daily. 50+ Senior Vitamin Daily    . omeprazole (PRILOSEC) 40 MG capsule TAKE 1 CAPSULE DAILY 90 capsule 1  . Polyethyl Glycol-Propyl Glycol (SYSTANE OP) Place 1 drop into both eyes 3 (three) times daily as needed (dry eyes). Use 1-2 Drops in both eyes    . SALINE NASAL SPRAY NA Place 1 spray into both nostrils daily as needed (congestion).     . vitamin E 400 UNIT capsule Take 400 Units by mouth daily.    . metoprolol tartrate (LOPRESSOR) 25 MG tablet Take 1 tablet (25 mg total) by mouth 2 (two) times daily. 60 tablet 1   No current facility-administered medications for this visit.    Physical Exam BP 131/71 mmHg  Pulse 85  Resp 16  Ht 5'  2" (1.575 m)  Wt 114 lb (51.71 kg)  BMI 20.85 kg/m2  SpO54 18% 81 year old woman in no acute distress Well-developed well-nourished Appears well Cardiac regular rate and rhythm normal S1 and S2 no murmur Lungs clear with equal breath sounds bilaterally No peripheral edema Sternum stable, incision clean dry and intact  Diagnostic Tests: I personally reviewed her chest x-ray. It shows postoperative changes. Her bilateral pleural effusions are nearly resolved.  Impression: 81 year old woman who is now almost 6 weeks out from a mitral valve repair. She's doing well from a cardiac standpoint at this point in time. She's had no evidence of congestive heart failure. She has minimal incisional pain.  Her primary concern is her problems with her balance. She's having to walk with a cane. She does not have any dizziness or presyncope-type symptoms. She is concerned about trying to walk by herself. Her daughter noted that this was a timed with a medication change. I suspect is coincidental, but it is easy enough to put her back on Lopressor and discontinue the Toprol-XL. She sees Dr. Regis Bill today and I recommended that she talk about that issue with her as well. If the medication change does not resolve the symptoms we will need to refer her to neurology.  She has no edema and no evidence of congestive heart failure. I'm going to stop her Lasix and potassium. She will wear self every day. If she notices gaining more than 3 pounds a day or 5 pounds in 3 days to go back on the Lasix and every other day potassium.  Her activities are unrestricted, but she was cautioned to build into new activities gradually. She may begin driving on a limited basis, appropriate precautions were discussed.  She is in sinus rhythm today. I'm going to stop her amiodarone and see how she does. She will continue Eliquis.   Plan: Follow-up is scheduled with Dr. Regis Bill  DC amiodarone  DC Lasix and potassium. Daily weights.  Resume if weight gain is noted  I will see her back in one month check on her progress.  Amy Nakayama, MD [facility] [phone]

## 2015-11-20 NOTE — Patient Outreach (Signed)
[facility]) Care Management  11/20/2015  Amy Fischer [birth year] [id number]  Late entry for 11/19/15 Telephone call to patient for follow up.  Unable to reach patient. HIPAA compliant voice message left with call back phone number.   PLAN; RNCM will attempt follow up with patient on 11/20/15.  [name] RN,BSN,CCM [place] Coordinator [phone]

## 2015-11-20 NOTE — Progress Notes (Signed)
Pre visit review using our clinic review tool, if applicable. No additional management support is needed unless otherwise documented below in the visit note.  Chief Complaint  Patient presents with  . Follow-up    hospital    HPI: Amy Fischer 81 y.o. comes in with daughter Amy Fischer today with pt  as as follow up from hospitalization 10 2 - 10 21 2016  for Acute valvular insufficieny mitral  Presenting with  chf and afibe requireing surgery  MV repair  .   Also complicated with c diff  Saw dr Lemmie Evens this am and  Now  Lasix stopped and potassium qod 40 ? ( not on med list).  FUDr [place] in 3 months  Amy Fischer daughter   stayed for  3 weeks .  Amiodarone and lasix d/ced  and qod .  Potassium  Folic acid  Qd also  Needs refill for eliquis  And thyroid med Amy Fischer is [name] agency consider ing balance training . Has been walking well has  Walker and cane  But fell  Lat week/ was bending over to pick up  Something and just  fell able to get up on her own  Denies injury  . Daughter says a little wobbling at times   ? Related to change to ER  b blocker ?[name]  Feels well on current thryoid  med   ROS: See pertinent positives and negatives per HPI. No cp sob  Edema bleeding  Vision change   Past Medical History  Diagnosis Date  . GERD (gastroesophageal reflux disease)   . Hyperlipidemia   . Hypothyroidism   . Osteoarthritis   . Osteoporosis   . Episodic recurrent vertigo     MRI Head 2003  . Allergy   . Bladder polyps   . Subclavian steal syndrome     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; CTA 2013 revealed open graft  . Hyperplastic colon polyp   . Esophageal spasm   . History of hepatitis     unknown type  . Asymptomatic carotid artery stenosis     R ICA 40% stenosis on CTA 12/2011.  [name] Anemia   . Cataract     BILATERAL-REMOVED  . Elevated blood pressure 03/25/2011    Bp readings borderline today has hx of elvation  In office and ok at home   Not checke recently   She gfeels was  elevated from anxiety    . Diverticulosis   . Intestinal metaplasia of gastric mucosa   . Hepatic hemangioma   . CHF (congestive heart failure) (HCC)     Family History  Problem Relation Age of Onset  . Hypertension Mother   . Heart disease Father   . Stroke Mother   . Colon cancer Neg Hx     Social History   Social History  . Marital Status: Widowed    Spouse Name: N/A  . Number of Children: 4  . Years of Education: N/A   Occupational History  . retired    Social History Main Topics  . Smoking status: Former Smoker    Quit date: 01/05/1991  . Smokeless tobacco: Never Used  . Alcohol Use: 2.4 oz/week    4 Glasses of wine per week     Comment: socially  . Drug Use: No  . Sexual Activity: Not Asked   Other Topics Concern  . None   Social History Narrative   Widowed   HH of 1    No pets  Former smoker   Exercises regularly       EXAM:  BP 122/72 mmHg  Temp(Src) 97.5 F (36.4 C) (Oral)  Ht 5\' 2"  (1.575 m)  Wt 113 lb 9.6 oz (51.529 kg)  BMI 20.77 kg/m2  Body mass index is 20.77 kg/(m^2).  GENERAL: vitals reviewed and listed above, alert, oriented, appears well hydrated and in no acute distress HEENT: atraumatic, conjunctiva  clear, no obvious abnormalities on inspection of external nose and ears OP : no lesion edema or exudate  NECK: no obvious masses on inspection palpation  LUNGS: clear to auscultation bilaterally, no wheezes, rales or rhonchi, good air movement CV:  No g or m pulse about 68?, no clubbing cyanosis or  peripheral edema nl cap refill  MS: moves all extremities without noticeable focal  Abnormality independent unassisted gait   Walks , turns PSYCH: pleasant and cooperative, no obvious depression or anxiety cognition seems intact and normal  Lab Results  Component Value Date   WBC 6.6 11/06/2015   HGB 13.2 11/06/2015   HCT 40.6 11/06/2015   PLT 236 11/06/2015   GLUCOSE 89 11/06/2015   CHOL 163 07/19/2014   TRIG 130.0 07/19/2014    HDL 63.00 07/19/2014   LDLCALC 74 07/19/2014   ALT 11* 10/15/2015   AST 15 10/15/2015   NA 142 11/06/2015   K 5.3 11/06/2015   CL 104 11/06/2015   CREATININE 0.89* 11/06/2015   BUN 29* 11/06/2015   CO2 26 11/06/2015   TSH 0.586 09/30/2015   INR 1.67* 10/10/2015   HGBA1C 5.6 10/09/2015   Wt Readings from Last 3 Encounters:  11/20/15 113 lb 9.6 oz (51.529 kg)  11/20/15 114 lb (51.71 kg)  11/16/15 112 lb (50.803 kg)   BP Readings from Last 3 Encounters:  11/20/15 122/72  11/20/15 131/71  11/06/15 128/60     ASSESSMENT AND PLAN:  Discussed the following assessment and plan:  Hypothyroidism, unspecified hypothyroidism type  Hospital discharge follow-up  Thrombocytopenia ([place]) - in hosp given folate  ok to refill  - Plan: folic acid (FOLVITE) 1 MG tablet, DISCONTINUED: folic acid (FOLVITE) 1 MG tablet  Hypertension, isolated systolic  Unsteadiness - as per my view looks steady in office today but high risk advise pt for fall preventinon and balance  Age factor  S/P MVR (mitral valve repair)  Paroxysmal atrial fibrillation (HCC)  Hx of fall - bending over not a trip of syncopal   Chronic diastolic CHF (congestive heart failure), NYHA class 2 ([place])  Anticoagulated - as per  cardiology  Med list seem incorrect per pt   Potassium qod   And not noted  -Patient advised to return or notify health care team  if symptoms worsen ,persist or new concerns arise. Total visit 32mins > 50% spent counseling and coordinating care as indicated in above note and in instructions to patient .   Patient Instructions  Keep appt  For lab in December  Make appt in January  For follow  Up.  Have gentiva  Call Amy Fischer for an order for balance pt training .   Disc the bending over issues  Your last labs were good .  We refilled the blood thinner but future should come from cardiology .      Amy Fischer. Amy Fischer M.D.   Admitting Diagnosis: Shortness of breath Acute on chronic diastolic  heart failure Atrial fibrillation  Discharge Diagnosis:  Severe mitral regurgitation Class 4 congestive heart failure Clostridium difficile colitis Postoperative atrial fibrillation Expected postoperative  blood loss anemia Heparin induced thrombocytopenia  Principal Problem:  Respiratory distress Active Problems:  Hypothyroidism  GERD  Hypertension, isolated systolic  Acute on chronic diastolic congestive heart failure, NYHA class 3 (HCC)  Paroxysmal atrial fibrillation (HCC)  Acute respiratory failure with hypoxia and hypercapnia (HCC)  Mitral regurgitation  Left hip pain  Acute congestive heart failure (HCC)  Severe mitral regurgitation  Shortness of breath  S/P MVR (mitral valve repair)

## 2015-11-20 NOTE — Telephone Encounter (Signed)
DONE

## 2015-11-20 NOTE — Telephone Encounter (Signed)
Pt needs her Eliquis called to [facility] on [address]

## 2015-11-21 ENCOUNTER — Telehealth: Payer: Self-pay | Admitting: Internal Medicine

## 2015-11-21 DIAGNOSIS — I5033 Acute on chronic diastolic (congestive) heart failure: Secondary | ICD-10-CM | POA: Diagnosis not present

## 2015-11-21 DIAGNOSIS — I10 Essential (primary) hypertension: Secondary | ICD-10-CM | POA: Diagnosis not present

## 2015-11-21 DIAGNOSIS — Z48812 Encounter for surgical aftercare following surgery on the circulatory system: Secondary | ICD-10-CM | POA: Diagnosis not present

## 2015-11-21 DIAGNOSIS — M810 Age-related osteoporosis without current pathological fracture: Secondary | ICD-10-CM | POA: Diagnosis not present

## 2015-11-21 DIAGNOSIS — M1990 Unspecified osteoarthritis, unspecified site: Secondary | ICD-10-CM | POA: Diagnosis not present

## 2015-11-21 DIAGNOSIS — I480 Paroxysmal atrial fibrillation: Secondary | ICD-10-CM | POA: Diagnosis not present

## 2015-11-21 NOTE — Telephone Encounter (Signed)
Danielle from [facility] called to receive permission to do PT with Ms. Mcfadden. Per Andee Poles, Ms. Deschamps told her Dr. Regis Bill wants her to work on Hotel manager with her. She'd like a phone call regarding this to see if it's correct.  Danielle's ph# [phone] Thank you.

## 2015-11-21 NOTE — Telephone Encounter (Signed)
Spoke to [employer] and gave her verbal authorization for  [employer].

## 2015-11-26 ENCOUNTER — Other Ambulatory Visit: Payer: Self-pay

## 2015-11-26 DIAGNOSIS — M1990 Unspecified osteoarthritis, unspecified site: Secondary | ICD-10-CM | POA: Diagnosis not present

## 2015-11-26 DIAGNOSIS — Z48812 Encounter for surgical aftercare following surgery on the circulatory system: Secondary | ICD-10-CM | POA: Diagnosis not present

## 2015-11-26 DIAGNOSIS — M810 Age-related osteoporosis without current pathological fracture: Secondary | ICD-10-CM | POA: Diagnosis not present

## 2015-11-26 DIAGNOSIS — I5033 Acute on chronic diastolic (congestive) heart failure: Secondary | ICD-10-CM | POA: Diagnosis not present

## 2015-11-26 DIAGNOSIS — I10 Essential (primary) hypertension: Secondary | ICD-10-CM | POA: Diagnosis not present

## 2015-11-26 DIAGNOSIS — I480 Paroxysmal atrial fibrillation: Secondary | ICD-10-CM | POA: Diagnosis not present

## 2015-11-26 NOTE — Patient Outreach (Signed)
[facility]) Care Management  11/26/2015  MARENDA BASHORE [birth year] [id number]   Patient triggered RED on EMMI Heart Failure Dashboard, notification sent to [name], RN.  Thanks, Ronnell Freshwater. [place], [place] Assistant Phone: [phone] Fax: [phone]

## 2015-11-26 NOTE — Patient Outreach (Addendum)
[facility]) Care Management  11/26/2015  ROTEM LIJEWSKI 09-Oct-1929 [id number]  SUBJECTIVE: Telephone call to patient regarding EMMI heart failure red follow up.  HIPAA verified with patient.  Patient states she is doing pretty good. Patient denies any shortness of breath or swelling. Patient states she followed up with Dr. Chauncey Cruel. [place] and her primary MD, Dr. Regis Bill on last week.  Patient states she has been cleared by Dr. Roxan Hockey to have limited driving.  Patient states Dr. Roxan Hockey discontinued her lasix pill. Patient states she was instructed to take her lasix if her weight increased by 3- 5lbs overnight 3 to 5 lbs as per action plan. Patient verbalized understanding. Patient states her doctor also altered a few of her other medications. Patient states he daughter is going to provide her with a new medication chart with her current medications and changes that were made to them.  Patient states she is awaiting follow up from her daughter on today.  Patient verbalized she is still trying to make sure she is using her new scale correctly because it will sometimes show increase and increase of weight even without having any symptoms. Patient states she can tell she is not experiencing a true weight increase at the time.  RNCM advised patient to step on scale and make sure the digital reading is at zero before proceeding to check her weight.  RNCM advised patient to weight every morning at the same time with the same weighted clothing on. Advised also to weigh after urination in the morning.  Patient verbalized understanding.   Patient states her physical therapist with [name] reported her balance concerns she was experiencing.  Patient states her primary MD approved for her to have balance training program with the physical therapist with [facility]. Patient states her doctor altered some of her medication due to possibly causing the balance concerns.  RNCM requested  patients daughter call to follow up regarding medication changes.  Patient states she will have her daughter call RNCM.  Patient states she still continues to get tired throughout the day.  RNCM advised patient to take frequent rest breaks. RNCM encouraged patient that some tiredness would be normal due to recent surgery. RNCM advised patient to report any unusual signs and symptoms to her doctor.  Patient verbalized understanding.  Patient verbally agreed to follow up telephone outreach  Call with [facility] .   ASSESSMENT: EMMI heart failure red referral. Patient status post mitral valve repair/heart failure.   PLAN: RNCM will follow up with patient/daughter within 3 business days to review medications due to recent changes. RNCM will continue to follow up with patient.  [name] RN,BSN,CCM [place] Coordinator [phone]

## 2015-11-29 ENCOUNTER — Ambulatory Visit: Payer: Self-pay

## 2015-11-29 DIAGNOSIS — M810 Age-related osteoporosis without current pathological fracture: Secondary | ICD-10-CM | POA: Diagnosis not present

## 2015-11-29 DIAGNOSIS — I10 Essential (primary) hypertension: Secondary | ICD-10-CM | POA: Diagnosis not present

## 2015-11-29 DIAGNOSIS — I5033 Acute on chronic diastolic (congestive) heart failure: Secondary | ICD-10-CM | POA: Diagnosis not present

## 2015-11-29 DIAGNOSIS — M1990 Unspecified osteoarthritis, unspecified site: Secondary | ICD-10-CM | POA: Diagnosis not present

## 2015-11-29 DIAGNOSIS — I480 Paroxysmal atrial fibrillation: Secondary | ICD-10-CM | POA: Diagnosis not present

## 2015-11-29 DIAGNOSIS — Z48812 Encounter for surgical aftercare following surgery on the circulatory system: Secondary | ICD-10-CM | POA: Diagnosis not present

## 2015-12-03 ENCOUNTER — Telehealth: Payer: Self-pay | Admitting: Cardiology

## 2015-12-03 ENCOUNTER — Other Ambulatory Visit: Payer: Self-pay

## 2015-12-03 MED ORDER — APIXABAN 2.5 MG PO TABS: 2.5000 mg | ORAL_TABLET | Freq: Two times a day (BID) | ORAL | Status: DC

## 2015-12-03 NOTE — Telephone Encounter (Signed)
Pt wants to know if she is going to be on Eliquis for a while. If so,please call her it to [facility]. She also wants to know if she supposed to be taking Potassium?

## 2015-12-03 NOTE — Telephone Encounter (Signed)
Spoke with pt, new script for eliquis sent to the pharmacy. Per lab work from 11-06-15 pt is to take potassium every other day.

## 2015-12-03 NOTE — Patient Outreach (Addendum)
[facility]) Care Management  12/03/2015  Amy Fischer [birth year] [id number]   SUBJECTIVE: Telephone call to patient regarding heart failure follow up.  HIPAA verified with patient.  Patient states she is doing ok.  Patient reports she received her medication chart from her daughter. Patient states she has been compliant with taking her medications.  RNCM reviewed medications with patient. Patient denies having any questions regarding her medications.  Patient denies any shortness of breath, swelling, cough or congestion today.  Patient reports she weighed and recorded weight this am at 112lbs. RNCM reviewed signs and symptoms of heart failure and heart failure action plan.  Patient in [place] zone today. Patient states she sometimes has some swelling in her ankles at night.  States that it is usually gone in the morning. Patient states she adheres to a no salt diet. RNCM advised patient of importance of continuing to weigh daily at the same time.  Advised patient to continue to adhere to low salt diet.  Advised patient to report any heart failure symptoms/signs to her doctor.  Patient states physical therapist is doing balance training exercises with her 2 times per week. Patient denies any falls and states she continues to use her cane. Patient denies any dizziness.  Patient states she was able to go to church on yesterday for the first time since being discharged from the hospital in October.  Patient reports she is still fatigued but doing better. RNCM advised patient to continue to use can inside and outside of her home for safety.  Patient verbalized understanding and agreement.  Patient denied any further complaints.  Patient agreed to next telephone outreach with Amy Fischer.   ASSESSMENT: Patient continuing to progress with self managing care. Physical therapy continues for balance issues.    PLAN: RNCM will follow up with patient within 2 weeks.   [name] RN,BSN,CCM [place] Coordinator [phone]

## 2015-12-04 ENCOUNTER — Other Ambulatory Visit (INDEPENDENT_AMBULATORY_CARE_PROVIDER_SITE_OTHER): Payer: Medicare Other

## 2015-12-04 DIAGNOSIS — I5033 Acute on chronic diastolic (congestive) heart failure: Secondary | ICD-10-CM | POA: Diagnosis not present

## 2015-12-04 DIAGNOSIS — I10 Essential (primary) hypertension: Secondary | ICD-10-CM | POA: Diagnosis not present

## 2015-12-04 DIAGNOSIS — I480 Paroxysmal atrial fibrillation: Secondary | ICD-10-CM | POA: Diagnosis not present

## 2015-12-04 DIAGNOSIS — E785 Hyperlipidemia, unspecified: Secondary | ICD-10-CM

## 2015-12-04 DIAGNOSIS — M1990 Unspecified osteoarthritis, unspecified site: Secondary | ICD-10-CM | POA: Diagnosis not present

## 2015-12-04 DIAGNOSIS — M810 Age-related osteoporosis without current pathological fracture: Secondary | ICD-10-CM | POA: Diagnosis not present

## 2015-12-04 DIAGNOSIS — Z48812 Encounter for surgical aftercare following surgery on the circulatory system: Secondary | ICD-10-CM | POA: Diagnosis not present

## 2015-12-04 DIAGNOSIS — E039 Hypothyroidism, unspecified: Secondary | ICD-10-CM

## 2015-12-04 LAB — TSH: TSH: 5.97 u[IU]/mL — AB (ref 0.35–4.50)

## 2015-12-04 LAB — LIPID PANEL
CHOLESTEROL: 149 mg/dL (ref 0–200)
HDL: 54.4 mg/dL (ref >39.00)
LDL Cholesterol: 72 mg/dL (ref 0–99)
NonHDL: 94.33
TRIGLYCERIDES: 110 mg/dL (ref 0.0–149.0)
Total CHOL/HDL Ratio: 3
VLDL: 22 mg/dL (ref 0.0–40.0)

## 2015-12-04 LAB — BASIC METABOLIC PANEL
BUN: 15 mg/dL (ref 6–23)
CHLORIDE: 108 meq/L (ref 96–112)
CO2: 29 meq/L (ref 19–32)
Calcium: 9.7 mg/dL (ref 8.4–10.5)
Creatinine, Ser: 0.76 mg/dL (ref 0.40–1.20)
GFR: 76.93 mL/min (ref >60.00)
GLUCOSE: 88 mg/dL (ref 70–99)
POTASSIUM: 4.8 meq/L (ref 3.5–5.1)
SODIUM: 143 meq/L (ref 135–145)

## 2015-12-06 DIAGNOSIS — I10 Essential (primary) hypertension: Secondary | ICD-10-CM | POA: Diagnosis not present

## 2015-12-06 DIAGNOSIS — I480 Paroxysmal atrial fibrillation: Secondary | ICD-10-CM | POA: Diagnosis not present

## 2015-12-06 DIAGNOSIS — Z48812 Encounter for surgical aftercare following surgery on the circulatory system: Secondary | ICD-10-CM | POA: Diagnosis not present

## 2015-12-06 DIAGNOSIS — M1990 Unspecified osteoarthritis, unspecified site: Secondary | ICD-10-CM | POA: Diagnosis not present

## 2015-12-06 DIAGNOSIS — M810 Age-related osteoporosis without current pathological fracture: Secondary | ICD-10-CM | POA: Diagnosis not present

## 2015-12-06 DIAGNOSIS — I5033 Acute on chronic diastolic (congestive) heart failure: Secondary | ICD-10-CM | POA: Diagnosis not present

## 2015-12-11 ENCOUNTER — Ambulatory Visit: Payer: Medicare Other | Admitting: Internal Medicine

## 2015-12-11 DIAGNOSIS — M810 Age-related osteoporosis without current pathological fracture: Secondary | ICD-10-CM | POA: Diagnosis not present

## 2015-12-11 DIAGNOSIS — I5033 Acute on chronic diastolic (congestive) heart failure: Secondary | ICD-10-CM | POA: Diagnosis not present

## 2015-12-11 DIAGNOSIS — I480 Paroxysmal atrial fibrillation: Secondary | ICD-10-CM | POA: Diagnosis not present

## 2015-12-11 DIAGNOSIS — Z48812 Encounter for surgical aftercare following surgery on the circulatory system: Secondary | ICD-10-CM | POA: Diagnosis not present

## 2015-12-11 DIAGNOSIS — I10 Essential (primary) hypertension: Secondary | ICD-10-CM | POA: Diagnosis not present

## 2015-12-11 DIAGNOSIS — M1990 Unspecified osteoarthritis, unspecified site: Secondary | ICD-10-CM | POA: Diagnosis not present

## 2015-12-12 DIAGNOSIS — I480 Paroxysmal atrial fibrillation: Secondary | ICD-10-CM | POA: Diagnosis not present

## 2015-12-12 DIAGNOSIS — M1990 Unspecified osteoarthritis, unspecified site: Secondary | ICD-10-CM | POA: Diagnosis not present

## 2015-12-12 DIAGNOSIS — Z48812 Encounter for surgical aftercare following surgery on the circulatory system: Secondary | ICD-10-CM | POA: Diagnosis not present

## 2015-12-12 DIAGNOSIS — I10 Essential (primary) hypertension: Secondary | ICD-10-CM | POA: Diagnosis not present

## 2015-12-12 DIAGNOSIS — I5033 Acute on chronic diastolic (congestive) heart failure: Secondary | ICD-10-CM | POA: Diagnosis not present

## 2015-12-12 DIAGNOSIS — M810 Age-related osteoporosis without current pathological fracture: Secondary | ICD-10-CM | POA: Diagnosis not present

## 2015-12-17 ENCOUNTER — Other Ambulatory Visit: Payer: Self-pay | Admitting: Internal Medicine

## 2015-12-17 DIAGNOSIS — I10 Essential (primary) hypertension: Secondary | ICD-10-CM | POA: Diagnosis not present

## 2015-12-17 DIAGNOSIS — I5033 Acute on chronic diastolic (congestive) heart failure: Secondary | ICD-10-CM | POA: Diagnosis not present

## 2015-12-17 DIAGNOSIS — M810 Age-related osteoporosis without current pathological fracture: Secondary | ICD-10-CM | POA: Diagnosis not present

## 2015-12-17 DIAGNOSIS — M1990 Unspecified osteoarthritis, unspecified site: Secondary | ICD-10-CM | POA: Diagnosis not present

## 2015-12-17 DIAGNOSIS — Z48812 Encounter for surgical aftercare following surgery on the circulatory system: Secondary | ICD-10-CM | POA: Diagnosis not present

## 2015-12-17 DIAGNOSIS — I480 Paroxysmal atrial fibrillation: Secondary | ICD-10-CM | POA: Diagnosis not present

## 2015-12-17 NOTE — Telephone Encounter (Signed)
Sent to the pharmacy by e-scribe. 

## 2015-12-19 DIAGNOSIS — M810 Age-related osteoporosis without current pathological fracture: Secondary | ICD-10-CM | POA: Diagnosis not present

## 2015-12-19 DIAGNOSIS — I480 Paroxysmal atrial fibrillation: Secondary | ICD-10-CM | POA: Diagnosis not present

## 2015-12-19 DIAGNOSIS — M1990 Unspecified osteoarthritis, unspecified site: Secondary | ICD-10-CM | POA: Diagnosis not present

## 2015-12-19 DIAGNOSIS — I5033 Acute on chronic diastolic (congestive) heart failure: Secondary | ICD-10-CM | POA: Diagnosis not present

## 2015-12-19 DIAGNOSIS — Z48812 Encounter for surgical aftercare following surgery on the circulatory system: Secondary | ICD-10-CM | POA: Diagnosis not present

## 2015-12-19 DIAGNOSIS — I10 Essential (primary) hypertension: Secondary | ICD-10-CM | POA: Diagnosis not present

## 2015-12-20 ENCOUNTER — Other Ambulatory Visit: Payer: Self-pay

## 2015-12-20 ENCOUNTER — Other Ambulatory Visit: Payer: Self-pay | Admitting: Internal Medicine

## 2015-12-20 NOTE — Patient Outreach (Signed)
[facility]) Care Management  [place]  12/20/2015   Amy Fischer 12-22-1929 [id number]  Subjective: Telephone call to patient regarding heart failure follow up.  HIPAA verified with patient. Patient states she is doing better.  Patient states she still becomes tired and has some shortness of breath with exertion. Patient states she is to start outreach rehabilitation next week. Patient states her home physical therapy ended on yesterday 12/19/15.  Patient states she did not weigh today or yesterday.  RNCM advised patient importance of weighing everyday and recording weights.  Reviewed with patient to report to her doctor if she has a 3lb weight gain over night or 5lb weight gain within 1 week. Patient verbalized understanding. Patient states she has been able to get out to do a little shopping.  Patient denies any falls and / or dizziness. Patient states she is not using her cane or walker anymore to ambulate.  Patient states her balance has improved.  RNCM reviewed signs/symptoms of heart failure with patient.  RNCM advised patient to contact her doctor for any heart failure symptoms.  Patient verbalized awareness of when to contact 911 for symptoms.   RNCM advised patient to continue to adhere to low salt diet. RNCM advised patient to continue to take her medications as prescribed.   Objective: n/a  Current Medications:  Current Outpatient Prescriptions  Medication Sig Dispense Refill  . acetaminophen (TYLENOL) 325 MG tablet Take 2 tablets (650 mg total) by mouth every 6 (six) hours as needed for mild pain.    [name] apixaban (ELIQUIS) 2.5 MG TABS tablet Take 1 tablet (2.5 mg total) by mouth 2 (two) times daily. 180 tablet 3  . aspirin EC 81 MG EC tablet Take 1 tablet (81 mg total) by mouth daily.    . CRESTOR 5 MG tablet TAKE ONE-HALF (1/2) TABLET DAILY 90 tablet 0  . ezetimibe (ZETIA) 10 MG tablet Take 1 tablet (10 mg total) by mouth daily. 30 tablet 0  . fish  oil-omega-3 fatty acids 1000 MG capsule Take 1 g by mouth daily.     . folic acid (FOLVITE) 1 MG tablet take 1 tablet by mouth once daily 30 tablet 0  . glucosamine-chondroitin 500-400 MG tablet Take 1 tablet by mouth 3 (three) times daily.     [name] levothyroxine (SYNTHROID, LEVOTHROID) 100 MCG tablet TAKE 1 TABLET (100 MCG TOTAL) BY MOUTH DAILY BEFORE BREAKFAST. 30 tablet 0  . Melatonin 3 MG TABS Take by mouth.    . metoprolol tartrate (LOPRESSOR) 25 MG tablet Take 1 tablet (25 mg total) by mouth 2 (two) times daily. 60 tablet 1  . MULTIPLE VITAMIN PO Take 1 tablet by mouth daily. 50+ Senior Vitamin Daily    . omeprazole (PRILOSEC) 40 MG capsule TAKE 1 CAPSULE DAILY 90 capsule 1  . Polyethyl Glycol-Propyl Glycol (SYSTANE OP) Place 1 drop into both eyes 3 (three) times daily as needed (dry eyes). Use 1-2 Drops in both eyes    . potassium chloride SA (K-DUR,KLOR-CON) 20 MEQ tablet Take 20 mEq by mouth once.    [name] SALINE NASAL SPRAY NA Place 1 spray into both nostrils daily as needed (congestion).     . vitamin E 400 UNIT capsule Take 400 Units by mouth daily.     No current facility-administered medications for this visit.    Functional Status:  In your present state of health, do you have any difficulty performing the following activities: 11/07/2015 09/30/2015  Hearing? N -  Vision? N -  Difficulty concentrating or making decisions? Y -  Walking or climbing stairs? Y -  Dressing or bathing? N -  Doing errands, shopping? Y N  Preparing Food and eating ? N -  Using the Toilet? N -  In the past six months, have you accidently leaked urine? N -  Do you have problems with loss of bowel control? N -  Managing your Medications? Y -  Managing your Finances? N -  Housekeeping or managing your Housekeeping? Y -    Fall/Depression Screening: PHQ 2/9 Scores 10/29/2015 07/26/2014  PHQ - 2 Score 5 2  PHQ- 9 Score 14 -    Assessment: Congestive heart failure follow up.  Patient continues to self  managing.  Social support from daughter and family.   Plan: RNCM will follow up with patient within 2 week.   [name] RN,BSN,CCM [place] Coordinator [phone]

## 2015-12-25 ENCOUNTER — Ambulatory Visit (INDEPENDENT_AMBULATORY_CARE_PROVIDER_SITE_OTHER): Payer: Medicare Other | Admitting: Family Medicine

## 2015-12-25 ENCOUNTER — Encounter: Payer: Self-pay | Admitting: Thoracic Surgery (Cardiothoracic Vascular Surgery)

## 2015-12-25 DIAGNOSIS — M810 Age-related osteoporosis without current pathological fracture: Secondary | ICD-10-CM

## 2015-12-25 MED ORDER — DENOSUMAB 60 MG/ML ~~LOC~~ SOLN
60.0000 mg | Freq: Once | SUBCUTANEOUS | Status: AC
  Administered 2015-12-25: 60 mg via SUBCUTANEOUS

## 2015-12-26 ENCOUNTER — Encounter: Payer: Self-pay | Admitting: *Deleted

## 2015-12-27 ENCOUNTER — Encounter (HOSPITAL_COMMUNITY)
Admission: RE | Admit: 2015-12-27 | Discharge: 2015-12-27 | Disposition: A | Discharge status: Home / Self Care | Payer: Medicare Other | Source: Ambulatory Visit | Attending: Cardiology | Admitting: Cardiology

## 2015-12-27 NOTE — Progress Notes (Signed)
Cardiac Rehab Medication Review by a Pharmacist  Does the patient  feel that his/her medications are working for him/her?  yes  Has the patient been experiencing any side effects to the medications prescribed?  no Complains of balance issues - but doesn't think it's from medication  Does the patient measure his/her own blood pressure or blood glucose at home?  no   Does the patient have any problems obtaining medications due to transportation or finances?   no  Understanding of regimen: fair Understanding of indications: good Potential of compliance: good Sometimes forgets Prilosec but is good about taking the others    Pharmacist comments: Amy Fischer has trouble remembering her medications without her updated list she keeps at home. She states she will bring the list to her next doctor's appointment to have a more accurate medication history. She does not notice any side effects or issues with her medications at this time.  Amy Fischer, PharmD Clinical Pharmacy Resident Pager # [phone] 12/27/2015 8:44 AM

## 2015-12-31 ENCOUNTER — Other Ambulatory Visit: Payer: Self-pay | Admitting: Internal Medicine

## 2016-01-01 ENCOUNTER — Encounter: Payer: Self-pay | Admitting: Thoracic Surgery (Cardiothoracic Vascular Surgery)

## 2016-01-01 ENCOUNTER — Ambulatory Visit (INDEPENDENT_AMBULATORY_CARE_PROVIDER_SITE_OTHER): Payer: Self-pay | Admitting: Thoracic Surgery (Cardiothoracic Vascular Surgery)

## 2016-01-01 VITALS — BP 125/65 | HR 80 | Resp 16 | Ht 62.0 in | Wt 114.0 lb

## 2016-01-01 DIAGNOSIS — Z9889 Other specified postprocedural states: Secondary | ICD-10-CM

## 2016-01-01 DIAGNOSIS — I340 Nonrheumatic mitral (valve) insufficiency: Secondary | ICD-10-CM

## 2016-01-01 NOTE — Telephone Encounter (Signed)
Sent to the pharmacy by e-scribe.  Spoke to the pt and she states she continues to take this medication.

## 2016-01-01 NOTE — Progress Notes (Signed)
[address]       [place] [address]             [phone]       HPI: Amy Fischer returns for a scheduled postop follow up visit.  Amy Fischer is an 82 year old woman who presented with congestive heart failure and acute respiratory failure. On presentation initially it was not clear whether she had congestive heart failure or pneumonia. She was treated with multiple antibiotics. Eventually she had an echocardiogram which showed severe mitral regurgitation due to a flail posterior leaflet. She had ruled out for an MI.  She underwent mitral valve repair on 10/11/2015. Postop course was primarily complicated by diarrhea. She was found to have C. difficile colitis and treated initially with by mouth vancomycin and then Flagyl. That ultimately resolved. She was in atrial fibrillation at the time of presentation, but was in sinus rhythm at time of discharge. She was sent home on Eliquis.  I last saw her 11/20/2015. She was feeling well, but complained of problems with her balance. Amy Fischer says that this started when they changed her Lopressor to Toprol-XL. I changed her back to lopressor.   Since her last visit she has continued to progress. She is not having any pain. She does not have any dizziness or weakness. She is no longer having to walk with a cane. She does note that her exercise tolerance is not back to the level it was prior to her surgery. She is anxious to start cardiac rehab. She says that at the end of the day she'll notice some swelling in her ankles, but it is not enough for anyone else to notice. It is better when she wakes up in the mornings.  Past Medical History  Diagnosis Date  . GERD (gastroesophageal reflux disease)   . Hyperlipidemia   . Hypothyroidism   . Osteoarthritis   . Osteoporosis   . Episodic recurrent vertigo     MRI Head 2003  . Allergy   . Bladder polyps   . Subclavian steal syndrome     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 graft  1999; CTA 2013 revealed open graft  . Hyperplastic colon polyp   . Esophageal spasm   . History of hepatitis     unknown type  . Asymptomatic carotid artery stenosis     R ICA 40% stenosis on CTA 12/2011.  [name] Anemia   . Cataract     BILATERAL-REMOVED  . Elevated blood pressure 03/25/2011    Bp readings borderline today has hx of elvation  In office and ok at home   Not checke recently   She gfeels was elevated from anxiety    . Diverticulosis   . Intestinal metaplasia of gastric mucosa   . Hepatic hemangioma   . CHF (congestive heart failure) ([place])      Current Outpatient Prescriptions  Medication Sig Dispense Refill  . acetaminophen (TYLENOL) 325 MG tablet Take 2 tablets (650 mg total) by mouth every 6 (six) hours as needed for mild pain.    [name] apixaban (ELIQUIS) 2.5 MG TABS tablet Take 1 tablet (2.5 mg total) by mouth 2 (two) times daily. 180 tablet 3  . aspirin EC 81 MG EC tablet Take 1 tablet (81 mg total) by mouth daily.    . CRESTOR 5 MG tablet TAKE ONE-HALF (1/2) TABLET DAILY 90 tablet 0  . ezetimibe (ZETIA) 10 MG tablet Take 1 tablet (10 mg total) by mouth daily.  30 tablet 0  . fish oil-omega-3 fatty acids 1000 MG capsule Take 1 g by mouth daily.     . folic acid (FOLVITE) 1 MG tablet take 1 tablet by mouth once daily 30 tablet 0  . glucosamine-chondroitin 500-400 MG tablet Take 1 tablet by mouth daily.     [name] levothyroxine (SYNTHROID, LEVOTHROID) 100 MCG tablet take 1 tablet by mouth every morning ON AN EMPTY STOMACH 30 tablet 4  . Melatonin 3 MG TABS Take by mouth.    . metoprolol tartrate (LOPRESSOR) 25 MG tablet Take 1 tablet (25 mg total) by mouth 2 (two) times daily. 60 tablet 1  . MULTIPLE VITAMIN PO Take 1 tablet by mouth daily. 50+ Senior Vitamin Daily    . omeprazole (PRILOSEC) 40 MG capsule TAKE 1 CAPSULE DAILY (Patient taking differently: TAKE 1 CAPSULE DAILY before dinner) 90 capsule 1  . Polyethyl Glycol-Propyl Glycol (SYSTANE OP) Place 1 drop into both eyes 2  (two) times daily. Use 1-2 Drops in both eyes    . potassium chloride SA (K-DUR,KLOR-CON) 20 MEQ tablet Take 20 mEq by mouth every other day.     [name] SALINE NASAL SPRAY NA Place 1 spray into both nostrils daily as needed (congestion).     . vitamin E 400 UNIT capsule Take 400 Units by mouth daily.     No current facility-administered medications for this visit.    Physical Exam BP 125/65 mmHg  Pulse 80  Resp 16  Ht 5\' 2"  (1.575 m)  Wt 114 lb (51.71 kg)  BMI 20.85 kg/m2  SpO2 98% 82 yo woman in NAD Well developed and nourished Alert and oriented 3 with no focal deficits Cardiac regular rate and rhythm normal S1 and S2 no rubs or murmurs Lungs clear with equal breath sounds bilaterally Incision well-healed No peripheral edema  Diagnostic Tests: none  Impression: 82 year old woman who is now 2-1/2 months out from a mitral valve repair. She is doing well with no signs or symptoms of congestive heart failure.  I think she is fine to start cardiac rehabilitation at this point.  Her heart rhythm remains regular. She is not aware of any irregularity to her heartbeat since discharge. I stopped her amiodarone at her last visit. She remains on a baby aspirin and apixaban. She should continue both of those until Dr. Stanford Breed determines whether it is safe to stop them. She has an appointment with him coming up in February.  Plan: Follow-up with Dr. Stanford Breed as scheduled  Continue to follow-up with Dr. Regis Bill  I will be happy to see her back any time if I can be of any further assistance with her care  [name], MD [facility] [phone]

## 2016-01-02 ENCOUNTER — Encounter (HOSPITAL_COMMUNITY): Payer: Medicare Other

## 2016-01-03 ENCOUNTER — Other Ambulatory Visit: Payer: Self-pay

## 2016-01-03 NOTE — Patient Outreach (Signed)
[facility]) Care Management  01/03/2016  Amy Fischer 29-Apr-1929 [id number]  Telephone call to patient regarding heart failure follow up.  Unable to reach patient. HIPAA compliant voice message left with call back phone number.   PLAN: RNCM will attempt 2nd telephone outreach to patient within 1 week.   [name] RN,BSN,CCM [place] Coordinator [phone]

## 2016-01-04 ENCOUNTER — Encounter (HOSPITAL_COMMUNITY)
Admission: RE | Admit: 2016-01-04 | Discharge: 2016-01-04 | Disposition: A | Discharge status: Home / Self Care | Payer: Medicare Other | Source: Ambulatory Visit | Attending: Cardiology | Admitting: Cardiology

## 2016-01-04 ENCOUNTER — Other Ambulatory Visit: Payer: Self-pay

## 2016-01-04 VITALS — Wt 114.0 lb

## 2016-01-04 DIAGNOSIS — Z9889 Other specified postprocedural states: Secondary | ICD-10-CM | POA: Diagnosis not present

## 2016-01-04 DIAGNOSIS — Z7982 Long term (current) use of aspirin: Secondary | ICD-10-CM | POA: Diagnosis not present

## 2016-01-04 DIAGNOSIS — Z7902 Long term (current) use of antithrombotics/antiplatelets: Secondary | ICD-10-CM | POA: Diagnosis not present

## 2016-01-04 DIAGNOSIS — I509 Heart failure, unspecified: Secondary | ICD-10-CM | POA: Diagnosis not present

## 2016-01-04 LAB — GLUCOSE, CAPILLARY
GLUCOSE-CAPILLARY: 78 mg/dL (ref 65–99)
Glucose-Capillary: 115 mg/dL — ABNORMAL HIGH (ref 65–99)

## 2016-01-04 NOTE — Patient Outreach (Signed)
[place] [facility]) Care Management  [place]  01/04/2016   Amy Fischer 22-Mar-1929 [id number]  Subjective: Telephone call to patient regarding heart failure follow up. HIPAA verified with patient. Patient states she is doing pretty good. Patient reports she started cardiac rehabilitation today. Patient states she will be attending cardiac rehab on Mondays, Wednesday's, and Fridays from 9:45am to 11:45am.  Patient states she did ok but became a little short of breath when doing the bicycle exercise and walking evaluation. Patient states therapist were aware. Patient states she rested for a moment then was able to resume without any problems.   Patient states she had a follow visit with the surgeon on 01/01/16.  Patient states everything went well.  Patient states she was instructed by the surgeon to continue to increase her activity.  Patient states she has been released from the surgeon follow up.   Patient denies any shortness of breath or swelling currently. Patient unable to state which heart failure action plan zone she was in.  After review of heart failure action plan/zones with patient, patient states she is in the Amy Fischer zone today.  Patient states her weight has been stable. Patient reports the scale at cardiac rehab weighed her 2lbs higher than her home scale.  Patient states she will continue to use her home scale for reporting purposes. Patient states she does have slight swelling in her ankles at night. Patient states the surgeon instructed her to elevate her legs at night.   Patient states she has a follow up visit in February 2017 with her cardiologist, Dr. Stanford Breed and a follow up with her primary MD, Dr.Panosh in two weeks. Patient in agreement to receive follow up calls with Amy Fischer.  Patient expressed her appreciation to this RNCM for providing case management services.   Objective: n/a  Current Medications:  Current Outpatient  Prescriptions  Medication Sig Dispense Refill  . acetaminophen (TYLENOL) 325 MG tablet Take 2 tablets (650 mg total) by mouth every 6 (six) hours as needed for mild pain.    [name] apixaban (ELIQUIS) 2.5 MG TABS tablet Take 1 tablet (2.5 mg total) by mouth 2 (two) times daily. 180 tablet 3  . aspirin EC 81 MG EC tablet Take 1 tablet (81 mg total) by mouth daily.    . CRESTOR 5 MG tablet TAKE ONE-HALF (1/2) TABLET DAILY 90 tablet 0  . ezetimibe (ZETIA) 10 MG tablet Take 1 tablet (10 mg total) by mouth daily. 30 tablet 0  . fish oil-omega-3 fatty acids 1000 MG capsule Take 1 g by mouth daily.     . folic acid (FOLVITE) 1 MG tablet take 1 tablet by mouth once daily 30 tablet 0  . glucosamine-chondroitin 500-400 MG tablet Take 1 tablet by mouth daily.     [name] levothyroxine (SYNTHROID, LEVOTHROID) 100 MCG tablet take 1 tablet by mouth every morning ON AN EMPTY STOMACH 30 tablet 4  . Melatonin 3 MG TABS Take by mouth.    . metoprolol tartrate (LOPRESSOR) 25 MG tablet Take 1 tablet (25 mg total) by mouth 2 (two) times daily. 60 tablet 1  . MULTIPLE VITAMIN PO Take 1 tablet by mouth daily. 50+ Senior Vitamin Daily    . omeprazole (PRILOSEC) 40 MG capsule TAKE 1 CAPSULE DAILY (Patient taking differently: TAKE 1 CAPSULE DAILY before dinner) 90 capsule 1  . Polyethyl Glycol-Propyl Glycol (SYSTANE OP) Place 1 drop into both eyes 2 (two) times daily. Use 1-2 Drops in  both eyes    . potassium chloride SA (K-DUR,KLOR-CON) 20 MEQ tablet Take 20 mEq by mouth every other day.     [name] SALINE NASAL SPRAY NA Place 1 spray into both nostrils daily as needed (congestion).     . vitamin E 400 UNIT capsule Take 400 Units by mouth daily.    [name] ZETIA 10 MG tablet TAKE 1 TABLET DAILY 90 tablet 0   No current facility-administered medications for this visit.    Functional Status:  In your present state of health, do you have any difficulty performing the following activities: 11/07/2015 09/30/2015  Hearing? N -  Vision? N -   Difficulty concentrating or making decisions? Y -  Walking or climbing stairs? Y -  Dressing or bathing? N -  Doing errands, shopping? Y N  Preparing Food and eating ? N -  Using the Toilet? N -  In the past six months, have you accidently leaked urine? N -  Do you have problems with loss of bowel control? N -  Managing your Medications? Y -  Managing your Finances? N -  Housekeeping or managing your Housekeeping? Y -    Fall/Depression Screening: PHQ 2/9 Scores 10/29/2015 07/26/2014  PHQ - 2 Score 5 2  PHQ- 9 Score 14 -   Fall Risk  11/07/2015 07/26/2014  Falls in the past year? Yes Yes  Number falls in past yr: 1 1  Injury with Fall? Yes -  Risk Factor Category  High Fall Risk -  Risk for fall due to : History of fall(s) -  Follow up (No Data) -   Assessment: Pleasant 82 year old who has strong social support from daughter and family. Patient was previously inpatient at [facility] from 10/19/15 to 10/22/15 for Acute Valvular Insufficieny, congestive heart failure, atrial fibrillation and c-diff/ status post MV repair per EPIC notes.   Patient discharged home with [facility] home health providing nursing and therapy services.  Home health services discontinued 12/19/15.  Patient started cardiac rehab services on today 01/04/16.  Patient will attend rehab services on Monday , Wednesday, and Fridays from 945am to 11:45am. Patient is self managing her care with support from her daughter, Amy Fischer.  Patient lives independently.  Patient has been seen and cleared for discharge by her surgeon, Dr.  Modesto Charon on 01/01/16.   Plan: RNCM will refer patient to health Fischer for continued heart failure education and support.  RNCM will send patients primary update letter notifying of patients transition from RN Case management services to RN health Fischer services with [facility] care management.    [name] RN,BSN,CCM [place] Coordinator [phone]   .

## 2016-01-04 NOTE — Progress Notes (Signed)
Pt started cardiac rehab today.  Pt tolerated light exercise without difficulty. VSS, telemetry-Sinus with a first degree heart block, asymptomatic.  Medication list reconciled.  Pt verbalized compliance with medications and denies barriers to compliance. PSYCHOSOCIAL ASSESSMENT:  PHQ-0. Pt exhibits positive coping skills, hopeful outlook with supportive family. No psychosocial needs identified at this time, no psychosocial interventions necessary.    Pt enjoys reading exercising and playing bridge.   Pt cardiac rehab  goal is  to be able to do ADL's without shortness of breath.  Pt encouraged to participate in functional fitness to increase ability to achieve these goals.   Pt long term cardiac rehab goal is to be able to be more active.  Pt oriented to exercise equipment and routine.  Understanding verbalized. Abiha's CBG's were spot checked today. Pre exercise CBG 78. Patient was given a banana and lemonade. Recheck CBG 115 post exercise. Patient encouraged to eat breakfast an hour before coming to exercise. Dede said she ate breakfast this morning at 0730. Will continue to monitor the patient throughout  the program. Patient left cardiac rehab without complaints.

## 2016-01-07 ENCOUNTER — Encounter (HOSPITAL_COMMUNITY): Payer: Medicare Other

## 2016-01-09 ENCOUNTER — Encounter (HOSPITAL_COMMUNITY)
Admission: RE | Admit: 2016-01-09 | Discharge: 2016-01-09 | Disposition: A | Discharge status: Home / Self Care | Payer: Medicare Other | Source: Ambulatory Visit | Attending: Cardiology | Admitting: Cardiology

## 2016-01-09 DIAGNOSIS — Z7902 Long term (current) use of antithrombotics/antiplatelets: Secondary | ICD-10-CM | POA: Diagnosis not present

## 2016-01-09 DIAGNOSIS — Z7982 Long term (current) use of aspirin: Secondary | ICD-10-CM | POA: Diagnosis not present

## 2016-01-09 DIAGNOSIS — Z9889 Other specified postprocedural states: Secondary | ICD-10-CM | POA: Diagnosis not present

## 2016-01-09 DIAGNOSIS — I509 Heart failure, unspecified: Secondary | ICD-10-CM | POA: Diagnosis not present

## 2016-01-11 ENCOUNTER — Encounter (HOSPITAL_COMMUNITY)
Admission: RE | Admit: 2016-01-11 | Discharge: 2016-01-11 | Disposition: A | Discharge status: Home / Self Care | Payer: Medicare Other | Source: Ambulatory Visit | Attending: Cardiology | Admitting: Cardiology

## 2016-01-11 DIAGNOSIS — I509 Heart failure, unspecified: Secondary | ICD-10-CM | POA: Diagnosis not present

## 2016-01-11 DIAGNOSIS — Z9889 Other specified postprocedural states: Secondary | ICD-10-CM | POA: Diagnosis not present

## 2016-01-11 DIAGNOSIS — Z7902 Long term (current) use of antithrombotics/antiplatelets: Secondary | ICD-10-CM | POA: Diagnosis not present

## 2016-01-11 DIAGNOSIS — Z7982 Long term (current) use of aspirin: Secondary | ICD-10-CM | POA: Diagnosis not present

## 2016-01-12 ENCOUNTER — Emergency Department (HOSPITAL_COMMUNITY): Payer: Medicare Other

## 2016-01-12 ENCOUNTER — Encounter (HOSPITAL_COMMUNITY): Payer: Self-pay | Admitting: Emergency Medicine

## 2016-01-12 ENCOUNTER — Emergency Department (HOSPITAL_COMMUNITY)
Admission: EM | Admit: 2016-01-12 | Discharge: 2016-01-13 | Disposition: A | Discharge status: Home / Self Care | Payer: Medicare Other | Attending: Emergency Medicine | Admitting: Emergency Medicine

## 2016-01-12 DIAGNOSIS — S199XXA Unspecified injury of neck, initial encounter: Secondary | ICD-10-CM | POA: Diagnosis not present

## 2016-01-12 DIAGNOSIS — K219 Gastro-esophageal reflux disease without esophagitis: Secondary | ICD-10-CM | POA: Insufficient documentation

## 2016-01-12 DIAGNOSIS — Z9889 Other specified postprocedural states: Secondary | ICD-10-CM | POA: Diagnosis not present

## 2016-01-12 DIAGNOSIS — Z7982 Long term (current) use of aspirin: Secondary | ICD-10-CM | POA: Diagnosis not present

## 2016-01-12 DIAGNOSIS — Z7902 Long term (current) use of antithrombotics/antiplatelets: Secondary | ICD-10-CM | POA: Diagnosis not present

## 2016-01-12 DIAGNOSIS — M25551 Pain in right hip: Secondary | ICD-10-CM

## 2016-01-12 DIAGNOSIS — Z86010 Personal history of colonic polyps: Secondary | ICD-10-CM | POA: Diagnosis not present

## 2016-01-12 DIAGNOSIS — S098XXA Other specified injuries of head, initial encounter: Secondary | ICD-10-CM | POA: Diagnosis not present

## 2016-01-12 DIAGNOSIS — S0003XA Contusion of scalp, initial encounter: Secondary | ICD-10-CM | POA: Insufficient documentation

## 2016-01-12 DIAGNOSIS — Y9389 Activity, other specified: Secondary | ICD-10-CM | POA: Diagnosis not present

## 2016-01-12 DIAGNOSIS — S79911A Unspecified injury of right hip, initial encounter: Secondary | ICD-10-CM | POA: Insufficient documentation

## 2016-01-12 DIAGNOSIS — E039 Hypothyroidism, unspecified: Secondary | ICD-10-CM | POA: Diagnosis not present

## 2016-01-12 DIAGNOSIS — Z862 Personal history of diseases of the blood and blood-forming organs and certain disorders involving the immune mechanism: Secondary | ICD-10-CM | POA: Insufficient documentation

## 2016-01-12 DIAGNOSIS — Z8669 Personal history of other diseases of the nervous system and sense organs: Secondary | ICD-10-CM | POA: Insufficient documentation

## 2016-01-12 DIAGNOSIS — I509 Heart failure, unspecified: Secondary | ICD-10-CM | POA: Insufficient documentation

## 2016-01-12 DIAGNOSIS — Z86018 Personal history of other benign neoplasm: Secondary | ICD-10-CM | POA: Insufficient documentation

## 2016-01-12 DIAGNOSIS — W19XXXA Unspecified fall, initial encounter: Secondary | ICD-10-CM

## 2016-01-12 DIAGNOSIS — M1990 Unspecified osteoarthritis, unspecified site: Secondary | ICD-10-CM | POA: Diagnosis not present

## 2016-01-12 DIAGNOSIS — Z87891 Personal history of nicotine dependence: Secondary | ICD-10-CM | POA: Insufficient documentation

## 2016-01-12 DIAGNOSIS — E785 Hyperlipidemia, unspecified: Secondary | ICD-10-CM | POA: Diagnosis not present

## 2016-01-12 DIAGNOSIS — Y998 Other external cause status: Secondary | ICD-10-CM | POA: Insufficient documentation

## 2016-01-12 DIAGNOSIS — Z79899 Other long term (current) drug therapy: Secondary | ICD-10-CM | POA: Diagnosis not present

## 2016-01-12 DIAGNOSIS — S0990XA Unspecified injury of head, initial encounter: Secondary | ICD-10-CM | POA: Diagnosis present

## 2016-01-12 DIAGNOSIS — Z8619 Personal history of other infectious and parasitic diseases: Secondary | ICD-10-CM | POA: Insufficient documentation

## 2016-01-12 DIAGNOSIS — Y92511 Restaurant or cafe as the place of occurrence of the external cause: Secondary | ICD-10-CM | POA: Diagnosis not present

## 2016-01-12 DIAGNOSIS — W228XXA Striking against or struck by other objects, initial encounter: Secondary | ICD-10-CM | POA: Insufficient documentation

## 2016-01-12 LAB — CBC
HEMATOCRIT: 41 % (ref 36.0–46.0)
HEMOGLOBIN: 13.4 g/dL (ref 12.0–15.0)
MCH: 31.6 pg (ref 26.0–34.0)
MCHC: 32.7 g/dL (ref 30.0–36.0)
MCV: 96.7 fL (ref 78.0–100.0)
Platelets: 217 10*3/uL (ref 150–400)
RBC: 4.24 MIL/uL (ref 3.87–5.11)
RDW: 14.4 % (ref 11.5–15.5)
WBC: 7.3 10*3/uL (ref 4.0–10.5)

## 2016-01-12 LAB — BASIC METABOLIC PANEL
Anion gap: 10 (ref 5–15)
BUN: 20 mg/dL (ref 6–20)
CHLORIDE: 109 mmol/L (ref 101–111)
CO2: 22 mmol/L (ref 22–32)
CREATININE: 0.71 mg/dL (ref 0.44–1.00)
Calcium: 9.7 mg/dL (ref 8.9–10.3)
GFR calc non Af Amer: >60 mL/min (ref >60)
GLUCOSE: 124 mg/dL — AB (ref 65–99)
Potassium: 4.4 mmol/L (ref 3.5–5.1)
Sodium: 141 mmol/L (ref 135–145)

## 2016-01-12 LAB — PROTIME-INR
INR: 1.02 (ref 0.00–1.49)
Prothrombin Time: 13.6 seconds (ref 11.6–15.2)

## 2016-01-12 MED ORDER — ACETAMINOPHEN 325 MG PO TABS
650.0000 mg | ORAL_TABLET | Freq: Once | ORAL | Status: AC
  Administered 2016-01-12: 650 mg via ORAL
  Filled 2016-01-12: qty 2

## 2016-01-12 NOTE — ED Notes (Signed)
Transported to radiology 

## 2016-01-12 NOTE — ED Provider Notes (Signed)
CSN: [id number]     Arrival date & time 01/12/16  2057 History   First MD Initiated Contact with Patient 01/12/16 2058     Chief Complaint  Patient presents with  . Fall    Patient is a 82 y.o. female presenting with fall. The history is provided by the patient.  Fall This is a new problem. The current episode started today. The problem occurs rarely. The problem has been unchanged. Associated symptoms include headaches. Pertinent negatives include no abdominal pain, chest pain, congestion, coughing, fever, nausea or visual change. Nothing aggravates the symptoms. She has tried nothing for the symptoms.    Past Medical History  Diagnosis Date  . GERD (gastroesophageal reflux disease)   . Hyperlipidemia   . Hypothyroidism   . Osteoarthritis   . Osteoporosis   . Episodic recurrent vertigo     MRI Head 2003  . Allergy   . Bladder polyps   . Subclavian steal syndrome     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; CTA 2013 revealed open graft  . Hyperplastic colon polyp   . Esophageal spasm   . History of hepatitis     unknown type  . Asymptomatic carotid artery stenosis     R ICA 40% stenosis on CTA 12/2011.  [name] Anemia   . Cataract     BILATERAL-REMOVED  . Elevated blood pressure 03/25/2011    Bp readings borderline today has hx of elvation  In office and ok at home   Not checke recently   She gfeels was elevated from anxiety    . Diverticulosis   . Intestinal metaplasia of gastric mucosa   . Hepatic hemangioma   . CHF (congestive heart failure) [facility])    Past Surgical History  Procedure Laterality Date  . Appendectomy    . Cholecystectomy    . Carotid-subclavian bypass graft Left 1999    for Panacea steal syndrome  . Rotator cuff repair Left   . Elbow surgery Left   . Cystectomy Left     hand  . Tubal ligation    . Tonsillectomy    . Colonoscopy    . Tear duct probing  07/2014  . Knee surgery Left 2014  . Tee without cardioversion N/A 10/03/2015    Procedure:  TRANSESOPHAGEAL ECHOCARDIOGRAM (TEE);  Surgeon: Larey Dresser, MD;  Location: [place];  Service: Cardiovascular;  Laterality: N/A;  . Cardiac catheterization N/A 10/04/2015    Procedure: Right/Left Heart Cath and Coronary Angiography;  Surgeon: Belva Crome, MD;  Location: [place] [facility];  Service: Cardiovascular;  Laterality: N/A;  . Mitral valve repair N/A 10/10/2015    Procedure: MITRAL VALVE REPAIR (MVR) WITH SIZE 28 CARPENTIER-EDWARDS PHYSIO II ANNULOPLASTY RING;  Surgeon: [name], MD;  Location: [place];  Service: Open Heart Surgery;  Laterality: N/A;  . Tee without cardioversion N/A 10/10/2015    Procedure: TRANSESOPHAGEAL ECHOCARDIOGRAM (TEE);  Surgeon: [name], MD;  Location: [place];  Service: Open Heart Surgery;  Laterality: N/A;   Family History  Problem Relation Age of Onset  . Hypertension Mother   . Heart disease Father   . Stroke Mother   . Colon cancer Neg Hx    Social History  Substance Use Topics  . Smoking status: Former Smoker    Quit date: 01/05/1991  . Smokeless tobacco: Never Used  . Alcohol Use: 2.4 oz/week    4 Glasses of wine per week     Comment: socially  OB History    No data available     Review of Systems  Constitutional: Negative for fever and activity change.  HENT: Negative for congestion and trouble swallowing.   Eyes: Negative for visual disturbance.  Respiratory: Negative for cough and shortness of breath.   Cardiovascular: Negative for chest pain and leg swelling.  Gastrointestinal: Negative for nausea and abdominal pain.  Genitourinary: Negative.  Negative for decreased urine volume.  Musculoskeletal: Negative.   Skin: Negative for pallor and wound.  Neurological: Positive for headaches. Negative for seizures and syncope.      Allergies  Codeine; Risedronate sodium; Statins; and Augmentin  Home Medications   Prior to Admission medications   Medication Sig Start Date End Date Taking? Authorizing  Provider  acetaminophen (TYLENOL) 325 MG tablet Take 2 tablets (650 mg total) by mouth every 6 (six) hours as needed for mild pain. 10/22/15   Wayne E Gold, PA-C  apixaban (ELIQUIS) 2.5 MG TABS tablet Take 1 tablet (2.5 mg total) by mouth 2 (two) times daily. 12/03/15   Lelon Perla, MD  aspirin EC 81 MG EC tablet Take 1 tablet (81 mg total) by mouth daily. 10/22/15   Wayne E Gold, PA-C  CRESTOR 5 MG tablet TAKE ONE-HALF (1/2) TABLET DAILY 08/13/15   Burnis Medin, MD  ezetimibe (ZETIA) 10 MG tablet Take 1 tablet (10 mg total) by mouth daily. 02/05/15   Burnis Medin, MD  fish oil-omega-3 fatty acids 1000 MG capsule Take 1 g by mouth daily.     Historical Provider, MD  folic acid (FOLVITE) 1 MG tablet take 1 tablet by mouth once daily 12/17/15   Burnis Medin, MD  glucosamine-chondroitin 500-400 MG tablet Take 1 tablet by mouth daily.     Historical Provider, MD  levothyroxine (SYNTHROID, LEVOTHROID) 100 MCG tablet take 1 tablet by mouth every morning ON AN EMPTY STOMACH 12/21/15   Burnis Medin, MD  Melatonin 3 MG TABS Take by mouth.    Historical Provider, MD  metoprolol tartrate (LOPRESSOR) 25 MG tablet Take 1 tablet (25 mg total) by mouth 2 (two) times daily. 11/20/15   [name], MD  MULTIPLE VITAMIN PO Take 1 tablet by mouth daily. 50+ Senior Vitamin Daily    Historical Provider, MD  omeprazole (PRILOSEC) 40 MG capsule TAKE 1 CAPSULE DAILY Patient taking differently: TAKE 1 CAPSULE DAILY before dinner 02/08/15   Burnis Medin, MD  Polyethyl Glycol-Propyl Glycol (SYSTANE OP) Place 1 drop into both eyes 2 (two) times daily. Use 1-2 Drops in both eyes    Historical Provider, MD  potassium chloride SA (K-DUR,KLOR-CON) 20 MEQ tablet Take 20 mEq by mouth every other day.     Burnis Medin, MD  SALINE NASAL SPRAY NA Place 1 spray into both nostrils daily as needed (congestion).     Historical Provider, MD  vitamin E 400 UNIT capsule Take 400 Units by mouth daily.    Historical  Provider, MD  ZETIA 10 MG tablet TAKE 1 TABLET DAILY 01/01/16   Burnis Medin, MD   BP 161/65 mmHg  Pulse 83  Temp(Src) 98.1 F (36.7 C) (Oral)  Resp 16  SpO2 97% Physical Exam  Constitutional: She is oriented to person, place, and time. She appears well-developed and well-nourished. No distress.  HENT:  Head: Normocephalic.  Right Ear: External ear normal.  Left Ear: External ear normal.  Mouth/Throat: Oropharynx is clear and moist.  Small contusion to posterior scalp  Eyes: Conjunctivae  are normal. Pupils are equal, round, and reactive to light. No scleral icterus.  Neck: Normal range of motion. Neck supple.  No cervical spine midline ttp  Cardiovascular: Normal rate, regular rhythm, normal heart sounds and intact distal pulses.  Exam reveals no gallop and no friction rub.   No murmur heard. Pulmonary/Chest: Effort normal. No respiratory distress. She has no wheezes. She exhibits no tenderness.  Abdominal: Soft. She exhibits no distension. There is no tenderness. There is no rebound.  Musculoskeletal: Normal range of motion. She exhibits no edema or tenderness.  Neurological: She is alert and oriented to person, place, and time. No cranial nerve deficit. Coordination normal.  Skin: Skin is warm. No rash noted. She is not diaphoretic. No erythema. No pallor.  Psychiatric: She has a normal mood and affect.  Nursing note and vitals reviewed.   ED Course  Procedures (including critical care time) Labs Review Labs Reviewed - No data to display  Imaging Review No results found. I have personally reviewed and evaluated these images and lab results as part of my medical decision-making.   EKG Interpretation None      MDM   Final diagnoses:  None    Patient is a 82 year old female who presents after mechanical fall about an hour prior to arrival. She states that she fell backwards after losing balance hitting the back of her head. Denies loss consciousness, back pain,  shortness of breath, chest pain, dizziness but reports mild headache. She does take eliquis after recent mitral valve surgery. Further history and exam as above notable for stable vital signs and a small contusion to the back of her head. She does report right hip pain as well but is able to bear weight. EKG sinus rhythm without acute ischemic changes or arrhythmia. Head CT and C-spine CT obtained without acute injuries. Pelvis x-ray obtained without acute fracture. Headache improved after Tylenol.  I have reviewed all results and imaging. Patient stable for discharge home. Advised to have someone stay with her over the next day to observe her mental status. Advised follow-up with her PCP in the next 3-4 days as needed. Given strict return precautions.     Heriberto Antigua, MD 01/13/16 [id number]  Pattricia Boss, MD 01/15/16 1455

## 2016-01-12 NOTE — ED Notes (Addendum)
Pt. arrived with EMS from a restaurant lost her balance and fell backwards , hit her head against a wall , denies LOC , no neck or back pain , only complaining of mild headache . Alert and oriented at arrival . Pt. takes Eliquis.

## 2016-01-14 ENCOUNTER — Encounter (HOSPITAL_COMMUNITY)
Admission: RE | Admit: 2016-01-14 | Discharge: 2016-01-14 | Disposition: A | Discharge status: Home / Self Care | Payer: Medicare Other | Source: Ambulatory Visit | Attending: Cardiology | Admitting: Cardiology

## 2016-01-14 NOTE — Progress Notes (Signed)
Jenavi went to the ED after a fall. Aunalee has a follow up appointment with her primary care physician tomorrow. I advised that Jodel  To return after getting the okay to Exercise from Dr Zack Seal. No visible swelling present. Venesia will not exercise today.

## 2016-01-15 ENCOUNTER — Ambulatory Visit (INDEPENDENT_AMBULATORY_CARE_PROVIDER_SITE_OTHER): Payer: Medicare Other | Admitting: Internal Medicine

## 2016-01-15 ENCOUNTER — Encounter: Payer: Self-pay | Admitting: Internal Medicine

## 2016-01-15 ENCOUNTER — Other Ambulatory Visit: Payer: Self-pay

## 2016-01-15 VITALS — BP 110/66 | Temp 97.5°F | Ht 62.0 in | Wt 119.3 lb

## 2016-01-15 DIAGNOSIS — E039 Hypothyroidism, unspecified: Secondary | ICD-10-CM | POA: Diagnosis not present

## 2016-01-15 DIAGNOSIS — R54 Age-related physical debility: Secondary | ICD-10-CM | POA: Diagnosis not present

## 2016-01-15 DIAGNOSIS — I480 Paroxysmal atrial fibrillation: Secondary | ICD-10-CM | POA: Diagnosis not present

## 2016-01-15 DIAGNOSIS — Z7901 Long term (current) use of anticoagulants: Secondary | ICD-10-CM

## 2016-01-15 DIAGNOSIS — Z9181 History of falling: Secondary | ICD-10-CM

## 2016-01-15 DIAGNOSIS — IMO0001 Reserved for inherently not codable concepts without codable children: Secondary | ICD-10-CM

## 2016-01-15 MED ORDER — FOLIC ACID 1 MG PO TABS: 1.0000 mg | ORAL_TABLET | Freq: Every day | ORAL | Status: DC

## 2016-01-15 MED ORDER — POTASSIUM CHLORIDE CRYS ER 20 MEQ PO TBCR: 20.0000 meq | EXTENDED_RELEASE_TABLET | ORAL | Status: DC

## 2016-01-15 MED ORDER — LEVOTHYROXINE SODIUM 100 MCG PO TABS: ORAL_TABLET | ORAL | Status: DC

## 2016-01-15 NOTE — Patient Instructions (Signed)
Will refill your meds   Note ok to go back to rehab but if haing headaches  Then decrease intensity of activity .  You have some wax in left ear but  Not causing sx at this time . If ear is bothering you  Then let us know an.   Plan is to repeat your thyroid test.  In about another month  ( no OV  Needed)    If ok then ROV   In 4-6 months.

## 2016-01-15 NOTE — Progress Notes (Signed)
Pre visit review using our clinic review tool, if applicable. No additional management support is needed unless otherwise documented below in the visit note.  Chief Complaint  Patient presents with  . Follow-up    ed fall , note to go back to rehab  thyroid etc    HPI: Amy Fischer 82 y.o.  comes in for chronic disease/ medication management  Since last visit sustained a mechanical  fall ( we had done a referral for fall prevention pt last visit)   Ed evalu no significnat organ damage  Is post op avr folled by cv and cardiology    Note from cardiac  Rehab  Needed to return to rehab  pcp  Scenario: [name] out to eat with friend  and eating  Piece of candy on the way outwalking and getting ready to step down.  and fell backward and hit but and head  On corner of brick .  Fuzzy for a few seconds.  was ok. .   Neg check out in ed    NOmore headache today .    CV doing well   Has had wax in leftf tear but hearing ok now and not dizzy  Doesn't know why she lost her balance and fell.  Needs meds sent tomail away  Thyroid potass and folate   Progress Notes by Amy Kiel, RN at 01/14/2016 9:38 AM    Author: [name], RN Service: Cardiac Rehab Author Type: Registered Nurse   Filed: 01/14/2016 9:43 AM Note Time: 01/14/2016 9:38 AM Status: Signed   Editor: Amy Kiel, RN (Registered Nurse)     Expand All Collapse All   Amy Fischer went to the ED after a fall. Amy Fischer has a follow up appointment with her primary care physician tomorrow. I advised that [name] To return after getting the okay to Exercise from Dr Zack Seal. No visible swelling present. Amy Fischer will not exercise today.          Not dizzy .    ROS: See pertinent positives and negatives per HPI.no cp sob vision change  Sore top of head where had contusion  Past Medical History  Diagnosis Date  . GERD (gastroesophageal reflux disease)   . Hyperlipidemia   . Hypothyroidism   . Osteoarthritis   . Osteoporosis   .  Episodic recurrent vertigo     MRI Head 2003  . Allergy   . Bladder polyps   . Subclavian steal syndrome     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; CTA 2013 revealed open graft  . Hyperplastic colon polyp   . Esophageal spasm   . History of hepatitis     unknown type  . Asymptomatic carotid artery stenosis     R ICA 40% stenosis on CTA 12/2011.  Amy Fischer Anemia   . Cataract     BILATERAL-REMOVED  . Elevated blood pressure 03/25/2011    Bp readings borderline today has hx of elvation  In office and ok at home   Not checke recently   She gfeels was elevated from anxiety    . Diverticulosis   . Intestinal metaplasia of gastric mucosa   . Hepatic hemangioma   . CHF (congestive heart failure) (HCC)     Family History  Problem Relation Age of Onset  . Hypertension Mother   . Heart disease Father   . Stroke Mother   . Colon cancer Neg Hx     Social History   Social History  .  Marital Status: Widowed    Spouse Name: N/A  . Number of Children: 4  . Years of Education: N/A   Occupational History  . retired    Social History Main Topics  . Smoking status: Former Smoker    Quit date: 01/05/1991  . Smokeless tobacco: Never Used  . Alcohol Use: 2.4 oz/week    4 Glasses of wine per week     Comment: socially  . Drug Use: No  . Sexual Activity: Not Asked   Other Topics Concern  . None   Social History Narrative   Widowed   HH of 1    No pets   Former smoker   Exercises regularly    Outpatient Prescriptions Prior to Visit  Medication Sig Dispense Refill  . acetaminophen (TYLENOL) 325 MG tablet Take 2 tablets (650 mg total) by mouth every 6 (six) hours as needed for mild pain.    Amy Fischer apixaban (ELIQUIS) 2.5 MG TABS tablet Take 1 tablet (2.5 mg total) by mouth 2 (two) times daily. 180 tablet 3  . aspirin EC 81 MG EC tablet Take 1 tablet (81 mg total) by mouth daily. (Patient taking differently: Take 81 mg by mouth at bedtime. )    . CRESTOR 5 MG tablet TAKE ONE-HALF (1/2)  TABLET DAILY (Patient taking differently: TAKE ONE-HALF (1/2) TABLET every evening) 90 tablet 0  . docusate sodium (COLACE) 100 MG capsule Take 100 mg by mouth daily as needed for mild constipation.    Amy Fischer ezetimibe (ZETIA) 10 MG tablet Take 1 tablet (10 mg total) by mouth daily. (Patient taking differently: Take 10 mg by mouth at bedtime. ) 30 tablet 0  . fish oil-omega-3 fatty acids 1000 MG capsule Take 1 g by mouth daily.     Amy Fischer glucosamine-chondroitin 500-400 MG tablet Take 1 tablet by mouth daily.     . Melatonin 3 MG TABS Take 3 mg by mouth at bedtime.     . metoprolol tartrate (LOPRESSOR) 25 MG tablet Take 1 tablet (25 mg total) by mouth 2 (two) times daily. 60 tablet 1  . MULTIPLE VITAMIN PO Take 1 tablet by mouth daily. 50+ Senior Vitamin Daily    . omeprazole (PRILOSEC) 40 MG capsule TAKE 1 CAPSULE DAILY (Patient taking differently: TAKE 1 CAPSULE DAILY before dinner) 90 capsule 1  . Polyethyl Glycol-Propyl Glycol (SYSTANE OP) Place 1 drop into both eyes 2 (two) times daily. Use 1-2 Drops in both eyes    . SALINE NASAL SPRAY NA Place 1 spray into both nostrils 3 (three) times daily.     . vitamin E 400 UNIT capsule Take 400 Units by mouth daily.    Amy Fischer ZETIA 10 MG tablet TAKE 1 TABLET DAILY 90 tablet 0  . folic acid (FOLVITE) 1 MG tablet take 1 tablet by mouth once daily 30 tablet 0  . levothyroxine (SYNTHROID, LEVOTHROID) 100 MCG tablet take 1 tablet by mouth every morning ON AN EMPTY STOMACH 30 tablet 4  . metoprolol succinate (TOPROL-XL) 50 MG 24 hr tablet Take 50 mg by mouth daily.    . potassium chloride SA (K-DUR,KLOR-CON) 20 MEQ tablet Take 20 mEq by mouth every other day.     Amy Fischer PACERONE 200 MG tablet Take 200 mg by mouth daily. Reported on 01/15/2016     No facility-administered medications prior to visit.     EXAM:  BP 110/66 mmHg  Temp(Src) 97.5 F (36.4 C) (Oral)  Ht 5\' 2"  (1.575 m)  Wt 119 lb 4.8  oz (54.114 kg)  BMI 21.81 kg/m2  Body mass index is 21.81  kg/(m^2).  GENERAL: vitals reviewed and listed above, alert, oriented, appears well hydrated and in no acute distress looks well and groomed well HEENT: ,smal bump on vertex of head  conjunctiva  clear, no obvious abnormalities on inspection of external nose and ears r tm nl left eac wax tm grey OP : no lesion edema or exudate  NECK: no obvious masses on inspection palpation  LUNGS: clear to auscultation bilaterally, no wheezes, rales or rhonchi, good air movement CV: HRRR, no clubbing cyanosis or  peripheral edema nl cap refill  MS: moves all extremities without noticeable focal  Abnormality djd changes  Gait stead tends to tile  But facile  PSYCH: pleasant and cooperative, no obvious depression or anxiety Lab Results  Component Value Date   WBC 7.3 01/12/2016   HGB 13.4 01/12/2016   HCT 41.0 01/12/2016   PLT 217 01/12/2016   GLUCOSE 124* 01/12/2016   CHOL 149 12/04/2015   TRIG 110.0 12/04/2015   HDL 54.40 12/04/2015   LDLCALC 72 12/04/2015   ALT 11* 10/15/2015   AST 15 10/15/2015   NA 141 01/12/2016   K 4.4 01/12/2016   CL 109 01/12/2016   CREATININE 0.71 01/12/2016   BUN 20 01/12/2016   CO2 22 01/12/2016   TSH 5.97* 12/04/2015   INR 1.02 01/12/2016   HGBA1C 5.6 10/09/2015   BP Readings from Last 3 Encounters:  01/15/16 110/66  01/13/16 141/66  01/01/16 125/65    ASSESSMENT AND PLAN:  Discussed the following assessment and plan:  Hypothyroidism, unspecified hypothyroidism type - tsh 5 range  recheck in a month    Hx of fall  Age factor  Paroxysmal atrial fibrillation ([place])  Anticoagulated Ok to resume card rehab  If  Ha not returinng  Discussion Total visit 26mins > 50% spent counseling and coordinating care as indicated in above note and in instructions to patient .  -Patient advised to return or notify health care team  if symptoms worsen ,persist or new concerns arise.  Patient Instructions  Will refill your meds   Note ok to go back to rehab but if  haing headaches  Then decrease intensity of activity .  You have some wax in left ear but  Not causing sx at this time . If ear is bothering you  Then let us know an.   Plan is to repeat your thyroid test.  In about another month  ( no OV  Needed)    If ok then ROV   In 4-6 months.   [name]. Torben Soloway M.D.

## 2016-01-15 NOTE — Patient Outreach (Signed)
[place] [facility]) Care Management  01/15/2016  Amy Fischer [birth year] [id number]   Telephone call attempt to patient for health coach initial assessment. No answer.  HIPAA compliant voice message left.    Plan: RN Health Coach will attempt patient within 1-2 weeks.   Jone Baseman, RN, MSN [place] [phone]

## 2016-01-16 ENCOUNTER — Encounter (HOSPITAL_COMMUNITY)
Admission: RE | Admit: 2016-01-16 | Discharge: 2016-01-16 | Disposition: A | Discharge status: Home / Self Care | Payer: Medicare Other | Source: Ambulatory Visit | Attending: Cardiology | Admitting: Cardiology

## 2016-01-16 NOTE — Progress Notes (Signed)
Patient offered.

## 2016-01-18 ENCOUNTER — Encounter (HOSPITAL_COMMUNITY)
Admission: RE | Admit: 2016-01-18 | Discharge: 2016-01-18 | Disposition: A | Discharge status: Home / Self Care | Payer: Medicare Other | Source: Ambulatory Visit | Attending: Cardiology | Admitting: Cardiology

## 2016-01-18 ENCOUNTER — Other Ambulatory Visit: Payer: Self-pay

## 2016-01-18 DIAGNOSIS — Z9889 Other specified postprocedural states: Secondary | ICD-10-CM | POA: Diagnosis not present

## 2016-01-18 DIAGNOSIS — I509 Heart failure, unspecified: Secondary | ICD-10-CM | POA: Diagnosis not present

## 2016-01-18 DIAGNOSIS — Z7982 Long term (current) use of aspirin: Secondary | ICD-10-CM | POA: Diagnosis not present

## 2016-01-18 DIAGNOSIS — Z7902 Long term (current) use of antithrombotics/antiplatelets: Secondary | ICD-10-CM | POA: Diagnosis not present

## 2016-01-18 NOTE — Progress Notes (Signed)
Reviewed home exercise with pt today.  Pt plans to continue to ride stationary bike at home for exercise.  Pt mentioned that she is nervous to walk after having previously fallen but does not want to use a walker.  She stated that she used to go to [employer] to walk with cart.  We discussed that it was perfect for her; she would have the support she needs as well as other around if she needs help.  She said that she would think about returning to that as part of her home exercise too.  Reviewed THR, pulse, RPE, sign and symptoms, NTG use, and when to call 911 or MD.  Pt voiced understanding. Alberteen Sam, MA, ACSM RCEP

## 2016-01-18 NOTE — Patient Outreach (Addendum)
[place] [facility]) Care Management  01/18/2016  Amy Fischer 1929-07-09 [id number]   2nd Telephone call to patient for initial health coach assessment. No answer HIPAA compliant voice message left.  Plan: RN Health Coach will contact patient within 1-2 weeks.   Jone Baseman, RN, MSN [place] [phone]

## 2016-01-21 ENCOUNTER — Encounter (HOSPITAL_COMMUNITY)
Admission: RE | Admit: 2016-01-21 | Discharge: 2016-01-21 | Disposition: A | Discharge status: Home / Self Care | Payer: Medicare Other | Source: Ambulatory Visit | Attending: Cardiology | Admitting: Cardiology

## 2016-01-21 DIAGNOSIS — I509 Heart failure, unspecified: Secondary | ICD-10-CM | POA: Diagnosis not present

## 2016-01-21 DIAGNOSIS — Z9889 Other specified postprocedural states: Secondary | ICD-10-CM | POA: Diagnosis not present

## 2016-01-21 DIAGNOSIS — Z7982 Long term (current) use of aspirin: Secondary | ICD-10-CM | POA: Diagnosis not present

## 2016-01-21 DIAGNOSIS — Z7902 Long term (current) use of antithrombotics/antiplatelets: Secondary | ICD-10-CM | POA: Diagnosis not present

## 2016-01-22 ENCOUNTER — Other Ambulatory Visit: Payer: Self-pay

## 2016-01-22 NOTE — Patient Outreach (Signed)
[place] [facility]) Care Management  01/22/2016  BIRDELL YING [birth year] [id number]   3rd telephone attempt to patient for initial assessment.  No answer HIPAA compliant voice message left.    Telephone call to daughter [name] in attempt to reach patient.  No answer HIPAA compliant voice message left.    Plan: RN Health Coach will attempt patient within 1-2 weeks.   Jone Baseman, RN, MSN [place] [phone]

## 2016-01-23 ENCOUNTER — Encounter (HOSPITAL_COMMUNITY)
Admission: RE | Admit: 2016-01-23 | Discharge: 2016-01-23 | Disposition: A | Discharge status: Home / Self Care | Payer: Medicare Other | Source: Ambulatory Visit | Attending: Cardiology | Admitting: Cardiology

## 2016-01-23 DIAGNOSIS — Z9889 Other specified postprocedural states: Secondary | ICD-10-CM | POA: Diagnosis not present

## 2016-01-23 DIAGNOSIS — Z7902 Long term (current) use of antithrombotics/antiplatelets: Secondary | ICD-10-CM | POA: Diagnosis not present

## 2016-01-23 DIAGNOSIS — Z7982 Long term (current) use of aspirin: Secondary | ICD-10-CM | POA: Diagnosis not present

## 2016-01-23 DIAGNOSIS — I509 Heart failure, unspecified: Secondary | ICD-10-CM | POA: Diagnosis not present

## 2016-01-24 ENCOUNTER — Other Ambulatory Visit: Payer: Self-pay

## 2016-01-24 NOTE — Patient Outreach (Signed)
[place] [facility]) Care Management  01/24/2016  Amy Fischer May 04, 1929 [id number]   Telephone call to patient to complete initial assessment.  No answer HIPAA compliant voice message left. Telephone call to primary care office to notify that health coach had not been able to reach patient.  Message left.  Plan: RN Health Coach will send letter to attempt outreach.  If no response after 10 business days case will be closed.    Amy Baseman, RN, MSN [place] [phone]

## 2016-01-25 ENCOUNTER — Encounter (HOSPITAL_COMMUNITY)
Admission: RE | Admit: 2016-01-25 | Discharge: 2016-01-25 | Disposition: A | Discharge status: Home / Self Care | Payer: Medicare Other | Source: Ambulatory Visit | Attending: Cardiology | Admitting: Cardiology

## 2016-01-25 DIAGNOSIS — I509 Heart failure, unspecified: Secondary | ICD-10-CM | POA: Diagnosis not present

## 2016-01-25 DIAGNOSIS — Z9889 Other specified postprocedural states: Secondary | ICD-10-CM | POA: Diagnosis not present

## 2016-01-25 DIAGNOSIS — Z7902 Long term (current) use of antithrombotics/antiplatelets: Secondary | ICD-10-CM | POA: Diagnosis not present

## 2016-01-25 DIAGNOSIS — Z7982 Long term (current) use of aspirin: Secondary | ICD-10-CM | POA: Diagnosis not present

## 2016-01-28 ENCOUNTER — Encounter (HOSPITAL_COMMUNITY)
Admission: RE | Admit: 2016-01-28 | Discharge: 2016-01-28 | Disposition: A | Discharge status: Home / Self Care | Payer: Medicare Other | Source: Ambulatory Visit | Attending: Cardiology | Admitting: Cardiology

## 2016-01-28 DIAGNOSIS — Z7982 Long term (current) use of aspirin: Secondary | ICD-10-CM | POA: Diagnosis not present

## 2016-01-28 DIAGNOSIS — I509 Heart failure, unspecified: Secondary | ICD-10-CM | POA: Diagnosis not present

## 2016-01-28 DIAGNOSIS — Z9889 Other specified postprocedural states: Secondary | ICD-10-CM | POA: Diagnosis not present

## 2016-01-28 DIAGNOSIS — Z7902 Long term (current) use of antithrombotics/antiplatelets: Secondary | ICD-10-CM | POA: Diagnosis not present

## 2016-01-30 ENCOUNTER — Encounter (HOSPITAL_COMMUNITY)
Admission: RE | Admit: 2016-01-30 | Discharge: 2016-01-30 | Disposition: A | Discharge status: Home / Self Care | Payer: Medicare Other | Source: Ambulatory Visit | Attending: Cardiology | Admitting: Cardiology

## 2016-01-30 DIAGNOSIS — Z9889 Other specified postprocedural states: Secondary | ICD-10-CM | POA: Insufficient documentation

## 2016-01-30 DIAGNOSIS — Z7982 Long term (current) use of aspirin: Secondary | ICD-10-CM | POA: Diagnosis not present

## 2016-01-30 DIAGNOSIS — I509 Heart failure, unspecified: Secondary | ICD-10-CM | POA: Insufficient documentation

## 2016-01-30 DIAGNOSIS — Z7902 Long term (current) use of antithrombotics/antiplatelets: Secondary | ICD-10-CM | POA: Diagnosis not present

## 2016-01-30 NOTE — Progress Notes (Addendum)
QUALITY OF LIFE SCORE REVIEW  Pt completed Quality of Life survey as a participant in Cardiac Rehab. Scores 21.0 or below are considered low. Pt score very low in several areas Overall 20.11, Health and Function 18.88, socioeconomic 23.60, physiological and spiritual 19.08, family 21.00. Patient quality of life slightly altered by physical constraints which limits ability to perform as prior to recent cardiac illness.  Amy Fischer wants to be able to do more. Amy Fischer is enjoying participating in phase 2 cardiac rehab .Amy Fischer says she is feeling better in general but is still experiencing some shortness of breath at times. Oxygen level 96% on room air . Patient denies any shortness of breath today. Amy Fischer is also concerned about her Sons and her health issues. Amy Fischer has had some depression in the past but denies being depressed currently. Amy Fischer lives alone her husband passed away several years ago. Amy Fischer has good family support from her children. Offered emotional support and reassurance.  Will continue to monitor and intervene as necessary. Will forward Quality of life questionnaire to Dr Stanford Breed and Dr Regis Bill for review. Amy Fischer's next follow up with Dr Stanford Breed is on 02/21/2015.

## 2016-01-31 ENCOUNTER — Other Ambulatory Visit: Payer: Self-pay

## 2016-01-31 NOTE — Patient Outreach (Signed)
[facility]) Care Management  [place]  01/31/2016   Amy Fischer [birth year] [id number]  Subjective: Telephone call to patient for initial assessment. Patient reports she is doing good.  She reports going to rehab Monday, Wednesday, Friday in the morning.  Patient reports she lives alone and still drives.  Patient able tro bathe and dress herself.  Patient shares she does her own light housekeeping.  Patient reports a fall about 3 weeks ago for which she went to the emergency room.  She denies any more falls since then.    Heart Failure: Patient reports that she weighs at rehab 3/week and last weight was 116 lbs.  Patient denies any shortness of breath or swelling.  Discussed with patient signs and symptoms of heart failure and when to notify her doctor.  She verbalized understanding.    Objective:   Current Medications:  Current Outpatient Prescriptions  Medication Sig Dispense Refill  . apixaban (ELIQUIS) 2.5 MG TABS tablet Take 1 tablet (2.5 mg total) by mouth 2 (two) times daily. 180 tablet 3  . acetaminophen (TYLENOL) 325 MG tablet Take 2 tablets (650 mg total) by mouth every 6 (six) hours as needed for mild pain.    [name] aspirin EC 81 MG EC tablet Take 1 tablet (81 mg total) by mouth daily. (Patient taking differently: Take 81 mg by mouth at bedtime. )    . CRESTOR 5 MG tablet TAKE ONE-HALF (1/2) TABLET DAILY (Patient taking differently: TAKE ONE-HALF (1/2) TABLET every evening) 90 tablet 0  . docusate sodium (COLACE) 100 MG capsule Take 100 mg by mouth daily as needed for mild constipation.    [name] ezetimibe (ZETIA) 10 MG tablet Take 1 tablet (10 mg total) by mouth daily. (Patient taking differently: Take 10 mg by mouth at bedtime. ) 30 tablet 0  . fish oil-omega-3 fatty acids 1000 MG capsule Take 1 g by mouth daily.     . folic acid (FOLVITE) 1 MG tablet Take 1 tablet (1 mg total) by mouth daily. 90 tablet 1  . glucosamine-chondroitin 500-400 MG tablet Take 1  tablet by mouth daily.     [name] levothyroxine (SYNTHROID, LEVOTHROID) 100 MCG tablet take 1 tablet by mouth every morning ON AN EMPTY STOMACH 90 tablet 1  . Melatonin 3 MG TABS Take 3 mg by mouth at bedtime.     . metoprolol tartrate (LOPRESSOR) 25 MG tablet Take 1 tablet (25 mg total) by mouth 2 (two) times daily. 60 tablet 1  . MULTIPLE VITAMIN PO Take 1 tablet by mouth daily. 50+ Senior Vitamin Daily    . omeprazole (PRILOSEC) 40 MG capsule TAKE 1 CAPSULE DAILY (Patient taking differently: TAKE 1 CAPSULE DAILY before dinner) 90 capsule 1  . PACERONE 200 MG tablet Take 200 mg by mouth daily. Reported on 01/15/2016    . Polyethyl Glycol-Propyl Glycol (SYSTANE OP) Place 1 drop into both eyes 2 (two) times daily. Use 1-2 Drops in both eyes    . potassium chloride SA (K-DUR,KLOR-CON) 20 MEQ tablet Take 1 tablet (20 mEq total) by mouth every other day. 45 tablet 1  . SALINE NASAL SPRAY NA Place 1 spray into both nostrils 3 (three) times daily.     . vitamin E 400 UNIT capsule Take 400 Units by mouth daily.    [name] ZETIA 10 MG tablet TAKE 1 TABLET DAILY 90 tablet 0   No current facility-administered medications for this visit.    Functional Status:  In your present  state of health, do you have any difficulty performing the following activities: 01/31/2016 11/07/2015  Hearing? N N  Vision? N N  Difficulty concentrating or making decisions? N Y  Walking or climbing stairs? Y Y  Dressing or bathing? N N  Doing errands, shopping? [name]  Preparing Food and eating ? N N  Using the Toilet? N N  In the past six months, have you accidently leaked urine? N N  Do you have problems with loss of bowel control? N N  Managing your Medications? N Y  Managing your Finances? N N  Housekeeping or managing your Housekeeping? [name]    Fall/Depression Screening: PHQ 2/9 Scores 01/31/2016 10/29/2015 07/26/2014  PHQ - 2 Score 0 5 2  PHQ- 9 Score - 14 -    Assessment: Patient will benefit from health coach outreach for  heart failure disease management.  Plan:  [facility] CM Care Plan Problem One        Most Recent Value   Care Plan Problem One  Heart Failure knowledge deficit   Role Documenting the Problem One  Health Coach   Care Plan for Problem One  Active   THN Long Term Goal (31-90 days)  Patient will verbalize signs and symptoms of heart failure within 90 days.   THN Long Term Goal Start Date  01/31/16   Interventions for Problem One Long Term Goal  RN Health Coach discussed signs and symptoms of heart failure with patient.   THN CM Short Term Goal #1 (0-30 days)  Patient will verbalize yellow zone of heart failure chart within 30 days   THN CM Short Term Goal #1 Start Date  01/31/16   Interventions for Short Term Goal #1  RN Health Coach reviewed yellow zone of heart failure chart with patient.   THN CM Short Term Goal #2 (0-30 days)  Patient will be able to verbalize 3 foods high in sodium within 30 days   THN CM Short Term Goal #2 Start Date  01/31/16   Interventions for Short Term Goal #2  [place] discussed with patient foods high in sodium.      THN CM Care Plan Problem Two        Most Recent Value   Care Plan Problem Two  Recent Fall   Role Documenting the Problem Two  [place] for Problem Two  Active   THN CM Short Term Goal #1 (0-30 days)  Patient will report no falls within the next 30 days.   THN CM Short Term Goal #1 Start Date  01/31/16   Interventions for Short Term Goal #2   [place] reivewed fall precautions and will sned fall information.       RN Health Coach will contact patient within one week to complete assessment and patient agrees to next contact.    Jone Baseman, RN, MSN [place] [phone]

## 2016-02-01 ENCOUNTER — Encounter (HOSPITAL_COMMUNITY)
Admission: RE | Admit: 2016-02-01 | Discharge: 2016-02-01 | Disposition: A | Discharge status: Home / Self Care | Payer: Medicare Other | Source: Ambulatory Visit | Attending: Cardiology | Admitting: Cardiology

## 2016-02-01 DIAGNOSIS — Z9889 Other specified postprocedural states: Secondary | ICD-10-CM | POA: Diagnosis not present

## 2016-02-01 DIAGNOSIS — Z7982 Long term (current) use of aspirin: Secondary | ICD-10-CM | POA: Diagnosis not present

## 2016-02-01 DIAGNOSIS — Z7902 Long term (current) use of antithrombotics/antiplatelets: Secondary | ICD-10-CM | POA: Diagnosis not present

## 2016-02-01 DIAGNOSIS — I509 Heart failure, unspecified: Secondary | ICD-10-CM | POA: Diagnosis not present

## 2016-02-04 ENCOUNTER — Encounter (HOSPITAL_COMMUNITY)
Admission: RE | Admit: 2016-02-04 | Discharge: 2016-02-04 | Disposition: A | Discharge status: Home / Self Care | Payer: Medicare Other | Source: Ambulatory Visit | Attending: Cardiology | Admitting: Cardiology

## 2016-02-04 DIAGNOSIS — Z9889 Other specified postprocedural states: Secondary | ICD-10-CM | POA: Diagnosis not present

## 2016-02-04 DIAGNOSIS — Z7982 Long term (current) use of aspirin: Secondary | ICD-10-CM | POA: Diagnosis not present

## 2016-02-04 DIAGNOSIS — I509 Heart failure, unspecified: Secondary | ICD-10-CM | POA: Diagnosis not present

## 2016-02-04 DIAGNOSIS — Z7902 Long term (current) use of antithrombotics/antiplatelets: Secondary | ICD-10-CM | POA: Diagnosis not present

## 2016-02-05 ENCOUNTER — Ambulatory Visit: Payer: Self-pay

## 2016-02-05 ENCOUNTER — Other Ambulatory Visit: Payer: Self-pay

## 2016-02-05 NOTE — Patient Outreach (Signed)
[facility]) Care Management  02/05/2016  TANIA HAAGEN 09-Aug-1929 [id number]   Telephone call to for completion of initial assessment.  Phone rung busy after several attempts.    Plan: RN Health Coach will attempt patient within 1-2 weeks.    Jone Baseman, RN, MSN [place] [phone]

## 2016-02-06 ENCOUNTER — Encounter (HOSPITAL_COMMUNITY)
Admission: RE | Admit: 2016-02-06 | Discharge: 2016-02-06 | Disposition: A | Discharge status: Home / Self Care | Payer: Medicare Other | Source: Ambulatory Visit | Attending: Cardiology | Admitting: Cardiology

## 2016-02-06 DIAGNOSIS — Z9889 Other specified postprocedural states: Secondary | ICD-10-CM | POA: Diagnosis not present

## 2016-02-06 DIAGNOSIS — Z7902 Long term (current) use of antithrombotics/antiplatelets: Secondary | ICD-10-CM | POA: Diagnosis not present

## 2016-02-06 DIAGNOSIS — I509 Heart failure, unspecified: Secondary | ICD-10-CM | POA: Diagnosis not present

## 2016-02-06 DIAGNOSIS — Z7982 Long term (current) use of aspirin: Secondary | ICD-10-CM | POA: Diagnosis not present

## 2016-02-06 NOTE — Progress Notes (Signed)
Amy Fischer 82 y.o. female Nutrition Note Spoke with pt. Nutrition Plan and Nutrition Survey goals reviewed with pt. Pt is following Step 2 of the Therapeutic Lifestyle Changes diet. Pt with dx of CHF. Per discussion, pt does not use canned/convenience foods often. Pt rarely adds salt to food. Pt eats out infrequently. Age-appropriate nutrition recommendations discussed. Pt expressed understanding of the information reviewed. Pt aware of nutrition education classes offered.  Lab Results  Component Value Date   HGBA1C 5.6 10/09/2015   Wt Readings from Last 3 Encounters:  01/31/16 116 lb (52.617 kg)  01/15/16 119 lb 4.8 oz (54.114 kg)  01/04/16 114 lb (51.71 kg)    Nutrition Diagnosis ? Food-and nutrition-related knowledge deficit related to lack of exposure to information as related to diagnosis of: ? CHF  Nutrition RX/ Estimated Daily Nutrition Needs for: wt  maintenance  1400-1600 Kcal, 45-50 gm fat, 9-11 gm sat fat, 1.4-1.6 gm trans-fat, <1500 mg sodium  Nutrition Intervention ? Pt's individual nutrition plan reviewed with pt. ? Benefits of adopting Therapeutic Lifestyle Changes discussed when Medficts reviewed. ? Pt to attend the Portion Distortion class ? Pt to attend the   ? Nutrition I class                  ? Nutrition II class  ? Pt given handouts for: ? Nutrition I class ? Nutrition II class ? Continue client-centered nutrition education by RD, as part of interdisciplinary care. Goal(s) ? Pt to describe the benefit of including fruits, vegetables, whole grains, and low-fat dairy products in a heart healthy meal plan. Monitor and Evaluate progress toward nutrition goal with team. Nutrition Risk: Change to Moderate Derek Mound, M.Ed, RD, LDN, CDE 02/06/2016 11:02 AM

## 2016-02-07 ENCOUNTER — Other Ambulatory Visit: Payer: Self-pay | Admitting: Internal Medicine

## 2016-02-07 NOTE — Telephone Encounter (Signed)
Can refill  X 1 year

## 2016-02-07 NOTE — Telephone Encounter (Signed)
Did not return in Jan for follow up

## 2016-02-07 NOTE — Telephone Encounter (Signed)
Sent to the pharmacy by e-scribe. 

## 2016-02-08 ENCOUNTER — Encounter (HOSPITAL_COMMUNITY)
Admission: RE | Admit: 2016-02-08 | Discharge: 2016-02-08 | Disposition: A | Discharge status: Home / Self Care | Payer: Medicare Other | Source: Ambulatory Visit | Attending: Cardiology | Admitting: Cardiology

## 2016-02-08 DIAGNOSIS — Z9889 Other specified postprocedural states: Secondary | ICD-10-CM | POA: Diagnosis not present

## 2016-02-08 DIAGNOSIS — Z7902 Long term (current) use of antithrombotics/antiplatelets: Secondary | ICD-10-CM | POA: Diagnosis not present

## 2016-02-08 DIAGNOSIS — Z7982 Long term (current) use of aspirin: Secondary | ICD-10-CM | POA: Diagnosis not present

## 2016-02-08 DIAGNOSIS — I509 Heart failure, unspecified: Secondary | ICD-10-CM | POA: Diagnosis not present

## 2016-02-11 ENCOUNTER — Encounter (HOSPITAL_COMMUNITY)
Admission: RE | Admit: 2016-02-11 | Discharge: 2016-02-11 | Disposition: A | Discharge status: Home / Self Care | Payer: Medicare Other | Source: Ambulatory Visit | Attending: Cardiology | Admitting: Cardiology

## 2016-02-11 DIAGNOSIS — Z9889 Other specified postprocedural states: Secondary | ICD-10-CM | POA: Diagnosis not present

## 2016-02-11 DIAGNOSIS — I509 Heart failure, unspecified: Secondary | ICD-10-CM | POA: Diagnosis not present

## 2016-02-11 DIAGNOSIS — Z7982 Long term (current) use of aspirin: Secondary | ICD-10-CM | POA: Diagnosis not present

## 2016-02-11 DIAGNOSIS — Z7902 Long term (current) use of antithrombotics/antiplatelets: Secondary | ICD-10-CM | POA: Diagnosis not present

## 2016-02-12 ENCOUNTER — Ambulatory Visit: Payer: Self-pay

## 2016-02-12 ENCOUNTER — Other Ambulatory Visit: Payer: Self-pay

## 2016-02-12 NOTE — Patient Outreach (Signed)
[facility]) Care Management  02/12/2016  Amy Fischer 1929/11/17 [id number]  Telephone call to patient to complete initial assessment.  No answer.  HIPAA compliant voice message left.    Plan: RN Health Coach will attempt patient within 1-2 weeks.    Jone Baseman, RN, MSN [place] [phone]

## 2016-02-13 ENCOUNTER — Encounter (HOSPITAL_COMMUNITY): Payer: Medicare Other

## 2016-02-15 ENCOUNTER — Encounter (HOSPITAL_COMMUNITY): Payer: Medicare Other

## 2016-02-15 ENCOUNTER — Other Ambulatory Visit (INDEPENDENT_AMBULATORY_CARE_PROVIDER_SITE_OTHER): Payer: Medicare Other

## 2016-02-15 DIAGNOSIS — E039 Hypothyroidism, unspecified: Secondary | ICD-10-CM

## 2016-02-15 LAB — TSH: TSH: 0.94 u[IU]/mL (ref 0.35–4.50)

## 2016-02-16 ENCOUNTER — Encounter (HOSPITAL_BASED_OUTPATIENT_CLINIC_OR_DEPARTMENT_OTHER): Payer: Self-pay | Admitting: *Deleted

## 2016-02-16 ENCOUNTER — Emergency Department (HOSPITAL_BASED_OUTPATIENT_CLINIC_OR_DEPARTMENT_OTHER): Payer: Medicare Other

## 2016-02-16 ENCOUNTER — Emergency Department (HOSPITAL_BASED_OUTPATIENT_CLINIC_OR_DEPARTMENT_OTHER)
Admission: EM | Admit: 2016-02-16 | Discharge: 2016-02-16 | Disposition: A | Discharge status: Home / Self Care | Payer: Medicare Other | Attending: Emergency Medicine | Admitting: Emergency Medicine

## 2016-02-16 DIAGNOSIS — Z7982 Long term (current) use of aspirin: Secondary | ICD-10-CM | POA: Diagnosis not present

## 2016-02-16 DIAGNOSIS — I509 Heart failure, unspecified: Secondary | ICD-10-CM | POA: Insufficient documentation

## 2016-02-16 DIAGNOSIS — Z79899 Other long term (current) drug therapy: Secondary | ICD-10-CM | POA: Diagnosis not present

## 2016-02-16 DIAGNOSIS — Z8505 Personal history of malignant neoplasm of liver: Secondary | ICD-10-CM | POA: Diagnosis not present

## 2016-02-16 DIAGNOSIS — Z9889 Other specified postprocedural states: Secondary | ICD-10-CM | POA: Diagnosis not present

## 2016-02-16 DIAGNOSIS — D649 Anemia, unspecified: Secondary | ICD-10-CM | POA: Diagnosis not present

## 2016-02-16 DIAGNOSIS — M1990 Unspecified osteoarthritis, unspecified site: Secondary | ICD-10-CM | POA: Insufficient documentation

## 2016-02-16 DIAGNOSIS — H269 Unspecified cataract: Secondary | ICD-10-CM | POA: Diagnosis not present

## 2016-02-16 DIAGNOSIS — Z86010 Personal history of colonic polyps: Secondary | ICD-10-CM | POA: Diagnosis not present

## 2016-02-16 DIAGNOSIS — Z8669 Personal history of other diseases of the nervous system and sense organs: Secondary | ICD-10-CM | POA: Insufficient documentation

## 2016-02-16 DIAGNOSIS — R0602 Shortness of breath: Secondary | ICD-10-CM

## 2016-02-16 DIAGNOSIS — E039 Hypothyroidism, unspecified: Secondary | ICD-10-CM | POA: Insufficient documentation

## 2016-02-16 DIAGNOSIS — M7981 Nontraumatic hematoma of soft tissue: Secondary | ICD-10-CM | POA: Insufficient documentation

## 2016-02-16 DIAGNOSIS — R05 Cough: Secondary | ICD-10-CM | POA: Insufficient documentation

## 2016-02-16 DIAGNOSIS — Z87891 Personal history of nicotine dependence: Secondary | ICD-10-CM | POA: Diagnosis not present

## 2016-02-16 DIAGNOSIS — K219 Gastro-esophageal reflux disease without esophagitis: Secondary | ICD-10-CM | POA: Insufficient documentation

## 2016-02-16 LAB — BASIC METABOLIC PANEL
Anion gap: 7 (ref 5–15)
BUN: 14 mg/dL (ref 6–20)
CHLORIDE: 108 mmol/L (ref 101–111)
CO2: 25 mmol/L (ref 22–32)
Calcium: 9.2 mg/dL (ref 8.9–10.3)
Creatinine, Ser: 0.64 mg/dL (ref 0.44–1.00)
GFR calc non Af Amer: >60 mL/min (ref >60)
Glucose, Bld: 98 mg/dL (ref 65–99)
Potassium: 4.4 mmol/L (ref 3.5–5.1)
Sodium: 140 mmol/L (ref 135–145)

## 2016-02-16 LAB — CBC WITH DIFFERENTIAL/PLATELET
Basophils Absolute: 0 10*3/uL (ref 0.0–0.1)
Basophils Relative: 1 %
Eosinophils Absolute: 0.2 10*3/uL (ref 0.0–0.7)
Eosinophils Relative: 2 %
HEMATOCRIT: 41.9 % (ref 36.0–46.0)
Hemoglobin: 13.4 g/dL (ref 12.0–15.0)
LYMPHS PCT: 14 %
Lymphs Abs: 0.9 10*3/uL (ref 0.7–4.0)
MCH: 30.9 pg (ref 26.0–34.0)
MCHC: 32 g/dL (ref 30.0–36.0)
MCV: 96.8 fL (ref 78.0–100.0)
MONO ABS: 0.8 10*3/uL (ref 0.1–1.0)
MONOS PCT: 11 %
NEUTROS ABS: 4.9 10*3/uL (ref 1.7–7.7)
Neutrophils Relative %: 72 %
Platelets: 197 10*3/uL (ref 150–400)
RBC: 4.33 MIL/uL (ref 3.87–5.11)
RDW: 14.2 % (ref 11.5–15.5)
WBC: 6.8 10*3/uL (ref 4.0–10.5)

## 2016-02-16 LAB — TROPONIN I

## 2016-02-16 NOTE — ED Notes (Signed)
Pt reports SOB worse this morning- States feeling bad since Wednesday- SOB with exertion- mitral valve repair in October-

## 2016-02-16 NOTE — ED Provider Notes (Signed)
CSN: [id number]     Arrival date & time 02/16/16  1306 History   First MD Initiated Contact with Patient 02/16/16 1329     Chief Complaint  Patient presents with  . Shortness of Breath     (Consider location/radiation/quality/duration/timing/severity/associated sxs/prior Treatment) Patient is a 82 y.o. female presenting with shortness of breath. The history is provided by the patient and a relative.  Shortness of Breath Associated symptoms: cough   Associated symptoms: no abdominal pain, no chest pain, no fever, no headaches and no rash    patient with complaint of shortness of breath this morning patient feeling bad since Wednesday. Shortness of breath with exertion. Status post mitral valve repair in October. Patient's had similar symptoms on and off since starting the medication prescribed by cardiology. Has been on that for a while. No loss of consciousness. Did not feel like she was going to pass out. Patient has a bit of a cough with shortness of breath but does not feel like she has an upper respiratory infection. No chest pain no abdominal pain. No fevers.  Past Medical History  Diagnosis Date  . GERD (gastroesophageal reflux disease)   . Hyperlipidemia   . Hypothyroidism   . Osteoarthritis   . Osteoporosis   . Episodic recurrent vertigo     MRI Head 2003  . Allergy   . Bladder polyps   . Subclavian steal syndrome     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; CTA 2013 revealed open graft  . Hyperplastic colon polyp   . Esophageal spasm   . History of hepatitis     unknown type  . Asymptomatic carotid artery stenosis     R ICA 40% stenosis on CTA 12/2011.  [name] Anemia   . Cataract     BILATERAL-REMOVED  . Elevated blood pressure 03/25/2011    Bp readings borderline today has hx of elvation  In office and ok at home   Not checke recently   She gfeels was elevated from anxiety    . Diverticulosis   . Intestinal metaplasia of gastric mucosa   . Hepatic hemangioma   . CHF  (congestive heart failure) [facility])    Past Surgical History  Procedure Laterality Date  . Appendectomy    . Cholecystectomy    . Carotid-subclavian bypass graft Left 1999    for [place] steal syndrome  . Rotator cuff repair Left   . Elbow surgery Left   . Cystectomy Left     hand  . Tubal ligation    . Tonsillectomy    . Colonoscopy    . Tear duct probing  07/2014  . Knee surgery Left 2014  . Tee without cardioversion N/A 10/03/2015    Procedure: TRANSESOPHAGEAL ECHOCARDIOGRAM (TEE);  Surgeon: Larey Dresser, MD;  Location: [place];  Service: Cardiovascular;  Laterality: N/A;  . Cardiac catheterization N/A 10/04/2015    Procedure: Right/Left Heart Cath and Coronary Angiography;  Surgeon: Belva Crome, MD;  Location: [place] CV LAB;  Service: Cardiovascular;  Laterality: N/A;  . Mitral valve repair N/A 10/10/2015    Procedure: MITRAL VALVE REPAIR (MVR) WITH SIZE 28 CARPENTIER-EDWARDS PHYSIO II ANNULOPLASTY RING;  Surgeon: [name], MD;  Location: [place];  Service: Open Heart Surgery;  Laterality: N/A;  . Tee without cardioversion N/A 10/10/2015    Procedure: TRANSESOPHAGEAL ECHOCARDIOGRAM (TEE);  Surgeon: [name], MD;  Location: [place];  Service: Open Heart Surgery;  Laterality: N/A;   Family History  Problem Relation Age of Onset  . Hypertension Mother   . Heart disease Father   . Stroke Mother   . Colon cancer Neg Hx    Social History  Substance Use Topics  . Smoking status: Former Smoker    Quit date: 01/05/1991  . Smokeless tobacco: Never Used  . Alcohol Use: 2.4 oz/week    4 Glasses of wine per week     Comment: socially   OB History    No data available     Review of Systems  Constitutional: Negative for fever.  HENT: Negative for congestion.   Eyes: Negative for visual disturbance.  Respiratory: Positive for cough and shortness of breath.   Cardiovascular: Negative for chest pain.  Gastrointestinal: Negative for abdominal pain.   Genitourinary: Negative for dysuria.  Musculoskeletal: Negative for back pain.  Skin: Negative for rash.  Neurological: Negative for syncope and headaches.  Hematological: Bruises/bleeds easily.  Psychiatric/Behavioral: Negative for confusion.      Allergies  Codeine; Risedronate sodium; Statins; Tape; and Augmentin  Home Medications   Prior to Admission medications   Medication Sig Start Date End Date Taking? Authorizing Provider  acetaminophen (TYLENOL) 325 MG tablet Take 2 tablets (650 mg total) by mouth every 6 (six) hours as needed for mild pain. 10/22/15  Yes Wayne E Gold, PA-C  apixaban (ELIQUIS) 2.5 MG TABS tablet Take 1 tablet (2.5 mg total) by mouth 2 (two) times daily. 12/03/15  Yes Lelon Perla, MD  aspirin EC 81 MG EC tablet Take 1 tablet (81 mg total) by mouth daily. Patient taking differently: Take 81 mg by mouth at bedtime.  10/22/15  Yes Wayne E Gold, PA-C  CRESTOR 5 MG tablet TAKE ONE-HALF (1/2) TABLET DAILY 02/07/16  Yes Burnis Medin, MD  docusate sodium (COLACE) 100 MG capsule Take 100 mg by mouth daily as needed for mild constipation.   Yes Historical Provider, MD  ezetimibe (ZETIA) 10 MG tablet Take 1 tablet (10 mg total) by mouth daily. Patient taking differently: Take 10 mg by mouth at bedtime.  02/05/15  Yes Burnis Medin, MD  fish oil-omega-3 fatty acids 1000 MG capsule Take 1 g by mouth daily.    Yes Historical Provider, MD  folic acid (FOLVITE) 1 MG tablet Take 1 tablet (1 mg total) by mouth daily. 01/15/16  Yes Burnis Medin, MD  glucosamine-chondroitin 500-400 MG tablet Take 1 tablet by mouth daily.    Yes Historical Provider, MD  levothyroxine (SYNTHROID, LEVOTHROID) 100 MCG tablet take 1 tablet by mouth every morning ON AN EMPTY STOMACH 01/15/16  Yes Burnis Medin, MD  Melatonin 3 MG TABS Take 3 mg by mouth at bedtime.    Yes Historical Provider, MD  metoprolol tartrate (LOPRESSOR) 25 MG tablet Take 1 tablet (25 mg total) by mouth 2 (two) times daily.  11/20/15  Yes [name], MD  MULTIPLE VITAMIN PO Take 1 tablet by mouth daily. 50+ Senior Vitamin Daily   Yes Historical Provider, MD  omeprazole (PRILOSEC) 40 MG capsule TAKE 1 CAPSULE DAILY Patient taking differently: TAKE 1 CAPSULE DAILY before dinner 02/08/15  Yes Burnis Medin, MD  Polyethyl Glycol-Propyl Glycol (SYSTANE OP) Place 1 drop into both eyes 2 (two) times daily. Use 1-2 Drops in both eyes   Yes Historical Provider, MD  potassium chloride SA (K-DUR,KLOR-CON) 20 MEQ tablet Take 1 tablet (20 mEq total) by mouth every other day. 01/15/16  Yes Burnis Medin, MD  SALINE NASAL SPRAY NA  Place 1 spray into both nostrils 3 (three) times daily.    Yes Historical Provider, MD  vitamin E 400 UNIT capsule Take 400 Units by mouth daily.   Yes Historical Provider, MD  PACERONE 200 MG tablet Take 200 mg by mouth daily. Reported on 01/15/2016 11/06/15   Historical Provider, MD  ZETIA 10 MG tablet TAKE 1 TABLET DAILY 01/01/16   Burnis Medin, MD   BP 144/74 mmHg  Pulse 89  Temp(Src) 97.9 F (36.6 C) (Oral)  Resp 22  Ht 5\' 2"  (1.575 m)  Wt 52.617 kg  BMI 21.21 kg/m2  SpO2 98% Physical Exam  Constitutional: She is oriented to person, place, and time. She appears well-developed and well-nourished. No distress.  HENT:  Head: Normocephalic and atraumatic.  Mouth/Throat: Oropharynx is clear and moist.  Eyes: Conjunctivae and EOM are normal. Pupils are equal, round, and reactive to light.  Neck: Normal range of motion. Neck supple.  Cardiovascular: Normal rate, regular rhythm and normal heart sounds.   No murmur heard. Pulmonary/Chest: Effort normal and breath sounds normal.  Abdominal: Soft. Bowel sounds are normal. There is no tenderness.  Musculoskeletal: Normal range of motion. She exhibits no edema.  Neurological: She is alert and oriented to person, place, and time. No cranial nerve deficit. She exhibits normal muscle tone.  Skin: Skin is warm. No rash noted.  Nursing note and  vitals reviewed.   ED Course  Procedures (including critical care time) Labs Review Labs Reviewed  CBC WITH DIFFERENTIAL/PLATELET  BASIC METABOLIC PANEL  TROPONIN I   Results for orders placed or performed during the hospital encounter of 02/16/16  CBC with Differential/Platelet  Result Value Ref Range   WBC 6.8 4.0 - 10.5 K/uL   RBC 4.33 3.87 - 5.11 MIL/uL   Hemoglobin 13.4 12.0 - 15.0 g/dL   HCT 41.9 36.0 - 46.0 %   MCV 96.8 78.0 - 100.0 fL   MCH 30.9 26.0 - 34.0 pg   MCHC 32.0 30.0 - 36.0 g/dL   RDW 14.2 11.5 - 15.5 %   Platelets 197 150 - 400 K/uL   Neutrophils Relative % 72 %   Neutro Abs 4.9 1.7 - 7.7 K/uL   Lymphocytes Relative 14 %   Lymphs Abs 0.9 0.7 - 4.0 K/uL   Monocytes Relative 11 %   Monocytes Absolute 0.8 0.1 - 1.0 K/uL   Eosinophils Relative 2 %   Eosinophils Absolute 0.2 0.0 - 0.7 K/uL   Basophils Relative 1 %   Basophils Absolute 0.0 0.0 - 0.1 K/uL  Basic metabolic panel  Result Value Ref Range   Sodium 140 135 - 145 mmol/L   Potassium 4.4 3.5 - 5.1 mmol/L   Chloride 108 101 - 111 mmol/L   CO2 25 22 - 32 mmol/L   Glucose, Bld 98 65 - 99 mg/dL   BUN 14 6 - 20 mg/dL   Creatinine, Ser 0.64 0.44 - 1.00 mg/dL   Calcium 9.2 8.9 - 10.3 mg/dL   GFR calc non Af Amer >60 >60 mL/min   GFR calc Af Amer >60 >60 mL/min   Anion gap 7 5 - 15  Troponin I  Result Value Ref Range   Troponin I <0.03 <0.031 ng/mL     Imaging Review Dg Chest 2 View  02/16/2016  CLINICAL DATA:  Shortness of breath and dry cough for 1 day. EXAM: CHEST  2 VIEW COMPARISON:  11/20/2015. FINDINGS: Normal sized heart. Clear lungs. Central peribronchial thickening. Median sternotomy wires and  prosthetic heart valve. Diffuse osteopenia. Surgical clips at the left neck base and overlying the left lung apex. Bilateral calcified granulomata. Bilateral shoulder degenerative changes. Old healed left rib fracture. IMPRESSION: Mild to moderate bronchitic changes. Electronically Signed   By: Claudie Revering M.D.   On: 02/16/2016 14:04   I have personally reviewed and evaluated these images and lab results as part of my medical decision-making.   EKG Interpretation   Date/Time:  Saturday February 16 2016 13:36:31 EST Ventricular Rate:  90 PR Interval:  212 QRS Duration: 89 QT Interval:  358 QTC Calculation: 438 R Axis:   33 Text Interpretation:  Sinus rhythm Borderline prolonged PR interval  Baseline wander in lead(s) V3 V6 No significant change since last tracing  Confirmed by Montae Stager  MD, Jersi Mcmaster ([id number]) on 02/16/2016 1:54:34 PM      MDM   Final diagnoses:  SOB (shortness of breath)   Patient with complaint of shortness of breath. Oxygen saturations here on room air of 97-99%. Patient also ambulated and maintain those oxygen saturations. Workup for the shortness of breath without significant findings. No tachycardia based on the high oxygen saturation not concerned about pulmonary embolus. EKG without acute changes. Chest x-rays negative for pneumonia pneumothorax or pulmonary edema. Troponin was negative. No evidence of an acute cardiac event. Labs without leukocytosis no anemia. Basic metabolic panel without significant abnormalities. Also patient's thyroid-stimulating hormone that was done during an office visit yesterday was in normal range.  Patient feels as if it may be related to her medications. She has appointment with her cardiologist next week she is going to talk to him about the various medications. In addition patient with ambulation was asymptomatic.  Fredia Sorrow, MD 02/16/16 [phone]

## 2016-02-16 NOTE — Discharge Instructions (Signed)
At today's workup without any significant findings. Keep your appointment with your cardiologist and discuss medications perhaps some adjustments could be made to help your symptoms. Today chest x-rays negative labs were normal and cardiac marker was negative. Return for any new or worse symptoms.

## 2016-02-16 NOTE — ED Notes (Signed)
Patient placed on cardiac monitor. Pt placed on auto vitals Q30.  

## 2016-02-16 NOTE — ED Notes (Addendum)
Pt able to ambulate with little assistance. Pt's SpO2 ranged between 97% to 99% when walking

## 2016-02-17 DIAGNOSIS — R069 Unspecified abnormalities of breathing: Secondary | ICD-10-CM | POA: Diagnosis not present

## 2016-02-18 ENCOUNTER — Telehealth: Payer: Self-pay | Admitting: Cardiology

## 2016-02-18 ENCOUNTER — Encounter (HOSPITAL_COMMUNITY): Payer: Medicare Other

## 2016-02-18 ENCOUNTER — Telehealth: Payer: Self-pay | Admitting: Internal Medicine

## 2016-02-18 NOTE — Telephone Encounter (Signed)
The patient is still having shortness of breath after having a breathing treatment last night in the ED. The patient said that it helped some. [facility] called the patient to let her know that someone will call her.

## 2016-02-18 NOTE — Telephone Encounter (Signed)
Ok to see her tomorrow at 71 15  And 11 30  Slots  Cardiology  referrerd her to PCP /

## 2016-02-18 NOTE — Telephone Encounter (Signed)
Pt called with c/o of SOB on exertion since Friday. Pt history of MVR and post-Op a. Fib. Pt has not missed any doses of medications. Pt stated that she went to urgent care/ED on Saturday as it was much worse. Pt evaluated and sent home. On Sunday pt called EMS and was given 2 breathing treatments and was felling fine. Pt stated she feels fine until she has to get up and moving.  She was very obviously SOB while talking on phone. Pt has no c/o of CP, dizziness, pressure or light headedness, or swelling.   Pt concerns reviewed with DOD, Dr. [place], suggested pt contact PCP.  Called PCP's office to try to get appt today for appt. They were going to review with MD and call her back to setup appt.  Pt contacted and made aware that PCP will be contacting her to setup appt and if she has not heard from them she will call them.  Pt verbalized understanding, no questions at this time.

## 2016-02-18 NOTE — Telephone Encounter (Signed)
Pt c/o Shortness Of Breath: STAT if SOB developed within the last 24 hours or pt is noticeably SOB on the phone  1. Are you currently SOB (can you hear that pt is SOB on the phone)? Yes   2. How long have you been experiencing SOB? Since fri 2/17- seen @ ED, also had paramedics come to house yesterday 02/17/16  3. Are you SOB when sitting or when up moving around? Moving around   4. Are you currently experiencing any other symptoms? no

## 2016-02-18 NOTE — Telephone Encounter (Signed)
Agree with  Emergent care if getting worse.   Lab Results  Component Value Date   WBC 6.8 02/16/2016   HGB 13.4 02/16/2016   HCT 41.9 02/16/2016   PLT 197 02/16/2016   GLUCOSE 98 02/16/2016   CHOL 149 12/04/2015   TRIG 110.0 12/04/2015   HDL 54.40 12/04/2015   LDLCALC 72 12/04/2015   ALT 11* 10/15/2015   AST 15 10/15/2015   NA 140 02/16/2016   K 4.4 02/16/2016   CL 108 02/16/2016   CREATININE 0.64 02/16/2016   BUN 14 02/16/2016   CO2 25 02/16/2016   TSH 0.94 02/15/2016   INR 1.02 01/12/2016   HGBA1C 5.6 10/09/2015

## 2016-02-18 NOTE — Telephone Encounter (Signed)
Spoke to the pt.  Scheduled her for 02/19/16 @ 11:15 AM. Pt advised to arrive at 11 AM.  Pt sounded sob while of the phone.  Stated she if fine as long as she is sitting but cannot breath when she is up and about.  Advised her to seek ED if having trouble breathing.  Will notify WP.

## 2016-02-18 NOTE — Telephone Encounter (Signed)
[facility] called to let you know that the patient called them today and was referred to see her PCP for her ED/EMS visit.

## 2016-02-19 ENCOUNTER — Encounter: Payer: Self-pay | Admitting: Internal Medicine

## 2016-02-19 ENCOUNTER — Ambulatory Visit (INDEPENDENT_AMBULATORY_CARE_PROVIDER_SITE_OTHER): Payer: Medicare Other | Admitting: Internal Medicine

## 2016-02-19 VITALS — BP 134/70 | HR 102 | Temp 97.5°F | Wt 112.4 lb

## 2016-02-19 DIAGNOSIS — R0600 Dyspnea, unspecified: Secondary | ICD-10-CM | POA: Diagnosis not present

## 2016-02-19 DIAGNOSIS — J40 Bronchitis, not specified as acute or chronic: Secondary | ICD-10-CM | POA: Diagnosis not present

## 2016-02-19 MED ORDER — METHYLPREDNISOLONE ACETATE 80 MG/ML IJ SUSP
80.0000 mg | Freq: Once | INTRAMUSCULAR | Status: AC
  Administered 2016-02-19: 80 mg via INTRAMUSCULAR

## 2016-02-19 MED ORDER — ALBUTEROL SULFATE HFA 108 (90 BASE) MCG/ACT IN AERS: 2.0000 | INHALATION_SPRAY | Freq: Four times a day (QID) | RESPIRATORY_TRACT | Status: DC | PRN

## 2016-02-19 MED ORDER — IPRATROPIUM-ALBUTEROL 0.5-2.5 (3) MG/3ML IN SOLN
3.0000 mL | Freq: Once | RESPIRATORY_TRACT | Status: AC
  Administered 2016-02-19: 3 mL via RESPIRATORY_TRACT

## 2016-02-19 NOTE — Patient Instructions (Addendum)
This acts like wheezing with bronchitis. Often caused bya virus chest infection but other causes.  Agree doesn't seem to be heart related .   rx  Albuterol inhaler and  Cortisone  Treatment .( depomedrol.) If fever o rinfection signs contact us   ROV in 1 week if not better or as needed .  Acute Bronchitis Bronchitis is inflammation of the airways that extend from the windpipe into the lungs (bronchi). The inflammation often causes mucus to develop. This leads to a cough, which is the most common symptom of bronchitis.  In acute bronchitis, the condition usually develops suddenly and goes away over time, usually in a couple weeks. Smoking, allergies, and asthma can make bronchitis worse. Repeated episodes of bronchitis may cause further lung problems.  CAUSES Acute bronchitis is most often caused by the same virus that causes a cold. The virus can spread from person to person (contagious) through coughing, sneezing, and touching contaminated objects. SIGNS AND SYMPTOMS   Cough.   Fever.   Coughing up mucus.   Body aches.   Chest congestion.   Chills.   Shortness of breath.   Sore throat.  DIAGNOSIS  Acute bronchitis is usually diagnosed through a physical exam. Your health care provider will also ask you questions about your medical history. Tests, such as chest X-rays, are sometimes done to rule out other conditions.  TREATMENT  Acute bronchitis usually goes away in a couple weeks. Oftentimes, no medical treatment is necessary. Medicines are sometimes given for relief of fever or cough. Antibiotic medicines are usually not needed but may be prescribed in certain situations. In some cases, an inhaler may be recommended to help reduce shortness of breath and control the cough. A cool mist vaporizer may also be used to help thin bronchial secretions and make it easier to clear the chest.  HOME CARE INSTRUCTIONS  Get plenty of rest.   Drink enough fluids to keep your urine  clear or pale yellow (unless you have a medical condition that requires fluid restriction). Increasing fluids may help thin your respiratory secretions (sputum) and reduce chest congestion, and it will prevent dehydration.   Take medicines only as directed by your health care provider.  If you were prescribed an antibiotic medicine, finish it all even if you start to feel better.  Avoid smoking and secondhand smoke. Exposure to cigarette smoke or irritating chemicals will make bronchitis worse. If you are a smoker, consider using nicotine gum or skin patches to help control withdrawal symptoms. Quitting smoking will help your lungs heal faster.   Reduce the chances of another bout of acute bronchitis by washing your hands frequently, avoiding people with cold symptoms, and trying not to touch your hands to your mouth, nose, or eyes.   Keep all follow-up visits as directed by your health care provider.  SEEK MEDICAL CARE IF: Your symptoms do not improve after 1 week of treatment.  SEEK IMMEDIATE MEDICAL CARE IF:  You develop an increased fever or chills.   You have chest pain.   You have severe shortness of breath.  You have bloody sputum.   You develop dehydration.  You faint or repeatedly feel like you are going to pass out.  You develop repeated vomiting.  You develop a severe headache. MAKE SURE YOU:   Understand these instructions.  Will watch your condition.  Will get help right away if you are not doing well or get worse.   This information is not intended to replace advice  given to you by your health care provider. Make sure you discuss any questions you have with your health care provider.   Document Released: 01/22/2005 Document Revised: 01/05/2015 Document Reviewed: 06/07/2013 Elsevier Interactive Patient Education [employer].

## 2016-02-19 NOTE — Progress Notes (Signed)
Pre visit review using our clinic review tool, if applicable. No additional management support is needed unless otherwise documented below in the visit note.  Chief Complaint  Patient presents with  . Follow-up  . Shortness of Breath    HPI: Patient Amy Fischer  comes in today for SDA for  new problem evaluation. She is here with her family member Comes in after ed visit 2 days  ago for dypnea responded to nebulizer    given to her by [facility] when called when she was acutely short of breath Labs showed  S/p mvr October  On anticoagulation  Thyroid has been okay. Is on a "new blood pressure medicine" and to see cardiology at the end of the week. Cardiology phone notes referred sx to  pcp about this and not related to cv problem . Ems :  Gave her a nebulizer   And helped    And took to ems     Neg eval in past. ? duoneb or not.  Onset cough  6 days ago . Then  Cough got worse and  uregent care Saturday .  Complaint of shortness of breath dyspnea on exertion. Runny nose is about the same no fever only a rare expectoration of pale yellow no hemoptysis. No real chest pain with this. No fever.      m dry cough     Chest x-ray in the urgent care emergency room showed some bronchitis changes. No acute pneumonia or heart failure. ROS: See pertinent positives and negatives per HPI.  Past Medical History  Diagnosis Date  . GERD (gastroesophageal reflux disease)   . Hyperlipidemia   . Hypothyroidism   . Osteoarthritis   . Osteoporosis   . Episodic recurrent vertigo     MRI Head 2003  . Allergy   . Bladder polyps   . Subclavian steal syndrome     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; CTA 2013 revealed open graft  . Hyperplastic colon polyp   . Esophageal spasm   . History of hepatitis     unknown type  . Asymptomatic carotid artery stenosis     R ICA 40% stenosis on CTA 12/2011.  [name] Anemia   . Cataract     BILATERAL-REMOVED  . Elevated blood pressure 03/25/2011    Bp readings  borderline today has hx of elvation  In office and ok at home   Not checke recently   She gfeels was elevated from anxiety    . Diverticulosis   . Intestinal metaplasia of gastric mucosa   . Hepatic hemangioma   . CHF (congestive heart failure) (HCC)     Family History  Problem Relation Age of Onset  . Hypertension Mother   . Heart disease Father   . Stroke Mother   . Colon cancer Neg Hx     Social History   Social History  . Marital Status: Widowed    Spouse Name: N/A  . Number of Children: 4  . Years of Education: N/A   Occupational History  . retired    Social History Main Topics  . Smoking status: Former Smoker    Quit date: 01/05/1991  . Smokeless tobacco: Never Used  . Alcohol Use: 2.4 oz/week    4 Glasses of wine per week     Comment: socially  . Drug Use: No  . Sexual Activity: Not Asked   Other Topics Concern  . None   Social History Narrative   Widowed  HH of 1    No pets   Former smoker   Exercises regularly    Outpatient Prescriptions Prior to Visit  Medication Sig Dispense Refill  . acetaminophen (TYLENOL) 325 MG tablet Take 2 tablets (650 mg total) by mouth every 6 (six) hours as needed for mild pain.    [name] apixaban (ELIQUIS) 2.5 MG TABS tablet Take 1 tablet (2.5 mg total) by mouth 2 (two) times daily. 180 tablet 3  . aspirin EC 81 MG EC tablet Take 1 tablet (81 mg total) by mouth daily. (Patient taking differently: Take 81 mg by mouth at bedtime. )    . CRESTOR 5 MG tablet TAKE ONE-HALF (1/2) TABLET DAILY 90 tablet 1  . docusate sodium (COLACE) 100 MG capsule Take 100 mg by mouth daily as needed for mild constipation.    [name] ezetimibe (ZETIA) 10 MG tablet Take 1 tablet (10 mg total) by mouth daily. (Patient taking differently: Take 10 mg by mouth at bedtime. ) 30 tablet 0  . fish oil-omega-3 fatty acids 1000 MG capsule Take 1 g by mouth daily.     . folic acid (FOLVITE) 1 MG tablet Take 1 tablet (1 mg total) by mouth daily. 90 tablet 1  .  glucosamine-chondroitin 500-400 MG tablet Take 1 tablet by mouth daily.     [name] levothyroxine (SYNTHROID, LEVOTHROID) 100 MCG tablet take 1 tablet by mouth every morning ON AN EMPTY STOMACH 90 tablet 1  . Melatonin 3 MG TABS Take 3 mg by mouth at bedtime.     . metoprolol tartrate (LOPRESSOR) 25 MG tablet Take 1 tablet (25 mg total) by mouth 2 (two) times daily. 60 tablet 1  . MULTIPLE VITAMIN PO Take 1 tablet by mouth daily. 50+ Senior Vitamin Daily    . omeprazole (PRILOSEC) 40 MG capsule TAKE 1 CAPSULE DAILY (Patient taking differently: TAKE 1 CAPSULE DAILY before dinner) 90 capsule 1  . PACERONE 200 MG tablet Take 200 mg by mouth daily. Reported on 01/15/2016    . Polyethyl Glycol-Propyl Glycol (SYSTANE OP) Place 1 drop into both eyes 2 (two) times daily. Use 1-2 Drops in both eyes    . potassium chloride SA (K-DUR,KLOR-CON) 20 MEQ tablet Take 1 tablet (20 mEq total) by mouth every other day. 45 tablet 1  . SALINE NASAL SPRAY NA Place 1 spray into both nostrils 3 (three) times daily.     . vitamin E 400 UNIT capsule Take 400 Units by mouth daily.    [name] ZETIA 10 MG tablet TAKE 1 TABLET DAILY 90 tablet 0   No facility-administered medications prior to visit.     EXAM:  BP 134/70 mmHg  Pulse 102  Temp(Src) 97.5 F (36.4 C) (Oral)  Wt 112 lb 6.4 oz (50.984 kg)  SpO2 98%  Body mass index is 20.55 kg/(m^2).  GENERAL: vitals reviewed and listed above, alert, oriented, appears well hydrated with a runny nose and a bronchial type cough with obvious dyspnea at rest without stridor. She was short of breath just getting up onto the table. Cognition intact balance is stable HEENT: atraumatic, conjunctiva  clear, no obvious abnormalities on inspection of external nose and ears OP : no lesion edema or exudate  NECK: no obvious masses on inspection palpation  LUNGS: Decreased air movement throughout without rales no retractions after nebulizer with DuoNeb increased air movement and she felt better  although still was dyspneic on exertion. CV: HRRR, no clubbing cyanosis or  peripheral edema nl cap refill  MS: moves all extremities without noticeable focal  abnormality has arthritic changes PSYCH: pleasant and cooperative, no obvious depression or anxiety Lab Results  Component Value Date   WBC 6.8 02/16/2016   HGB 13.4 02/16/2016   HCT 41.9 02/16/2016   PLT 197 02/16/2016   GLUCOSE 98 02/16/2016   CHOL 149 12/04/2015   TRIG 110.0 12/04/2015   HDL 54.40 12/04/2015   LDLCALC 72 12/04/2015   ALT 11* 10/15/2015   AST 15 10/15/2015   NA 140 02/16/2016   K 4.4 02/16/2016   CL 108 02/16/2016   CREATININE 0.64 02/16/2016   BUN 14 02/16/2016   CO2 25 02/16/2016   TSH 0.94 02/15/2016   INR 1.02 01/12/2016   HGBA1C 5.6 10/09/2015  ekg and trop=onin neg   ASSESSMENT AND PLAN:  Discussed the following assessment and plan:  Dyspnea - Plan: ipratropium-albuterol (DUONEB) 0.5-2.5 (3) MG/3ML nebulizer solution 3 mL  Wheezy bronchitis - Plan: ipratropium-albuterol (DUONEB) 0.5-2.5 (3) MG/3ML nebulizer solution 3 mL Data reviewed.   No history of asthma per se but does have a history of having used an inhaler in the remote past. Prescription given for albuterol to use every 6 hours as needed and steroid Depo-Medrol given today as she gets some side effects with oral prednisone. At this time no obvious bacterial infection low threshold to other interventions. She has a follow-up appointment with cardiology in 3 days. Uncertain if the metoprolol could've triggered this or a viral infection in the community. ROS he in one week or earlier if she is not doing well or seek emergent care. She really is dyspneic on exertion. -Patient advised to return or notify health care team  if symptoms worsen ,persist or new concerns arise.  Patient Instructions  This acts like wheezing with bronchitis. Often caused bya virus chest infection but other causes.  Agree doesn't seem to be heart related .   rx   Albuterol inhaler and  Cortisone  Treatment .( depomedrol.) If fever o rinfection signs contact us   ROV in 1 week if not better or as needed .  Acute Bronchitis Bronchitis is inflammation of the airways that extend from the windpipe into the lungs (bronchi). The inflammation often causes mucus to develop. This leads to a cough, which is the most common symptom of bronchitis.  In acute bronchitis, the condition usually develops suddenly and goes away over time, usually in a couple weeks. Smoking, allergies, and asthma can make bronchitis worse. Repeated episodes of bronchitis may cause further lung problems.  CAUSES Acute bronchitis is most often caused by the same virus that causes a cold. The virus can spread from person to person (contagious) through coughing, sneezing, and touching contaminated objects. SIGNS AND SYMPTOMS   Cough.   Fever.   Coughing up mucus.   Body aches.   Chest congestion.   Chills.   Shortness of breath.   Sore throat.  DIAGNOSIS  Acute bronchitis is usually diagnosed through a physical exam. Your health care provider will also ask you questions about your medical history. Tests, such as chest X-rays, are sometimes done to rule out other conditions.  TREATMENT  Acute bronchitis usually goes away in a couple weeks. Oftentimes, no medical treatment is necessary. Medicines are sometimes given for relief of fever or cough. Antibiotic medicines are usually not needed but may be prescribed in certain situations. In some cases, an inhaler may be recommended to help reduce shortness of breath and control the cough. A cool mist vaporizer  may also be used to help thin bronchial secretions and make it easier to clear the chest.  HOME CARE INSTRUCTIONS  Get plenty of rest.   Drink enough fluids to keep your urine clear or pale yellow (unless you have a medical condition that requires fluid restriction). Increasing fluids may help thin your respiratory  secretions (sputum) and reduce chest congestion, and it will prevent dehydration.   Take medicines only as directed by your health care provider.  If you were prescribed an antibiotic medicine, finish it all even if you start to feel better.  Avoid smoking and secondhand smoke. Exposure to cigarette smoke or irritating chemicals will make bronchitis worse. If you are a smoker, consider using nicotine gum or skin patches to help control withdrawal symptoms. Quitting smoking will help your lungs heal faster.   Reduce the chances of another bout of acute bronchitis by washing your hands frequently, avoiding people with cold symptoms, and trying not to touch your hands to your mouth, nose, or eyes.   Keep all follow-up visits as directed by your health care provider.  SEEK MEDICAL CARE IF: Your symptoms do not improve after 1 week of treatment.  SEEK IMMEDIATE MEDICAL CARE IF:  You develop an increased fever or chills.   You have chest pain.   You have severe shortness of breath.  You have bloody sputum.   You develop dehydration.  You faint or repeatedly feel like you are going to pass out.  You develop repeated vomiting.  You develop a severe headache. MAKE SURE YOU:   Understand these instructions.  Will watch your condition.  Will get help right away if you are not doing well or get worse.   This information is not intended to replace advice given to you by your health care provider. Make sure you discuss any questions you have with your health care provider.   Document Released: 01/22/2005 Document Revised: 01/05/2015 Document Reviewed: 06/07/2013 Elsevier Interactive Patient Education 2016 [place] K. Landon Bassford M.D. SOB (shortness of breath)   Patient with complaint of shortness of breath. Oxygen saturations here on room air of 97-99%. Patient also ambulated and maintain those oxygen saturations. Workup for the shortness of breath without  significant findings. No tachycardia based on the high oxygen saturation not concerned about pulmonary embolus. EKG without acute changes. Chest x-rays negative for pneumonia pneumothorax or pulmonary edema. Troponin was negative. No evidence of an acute cardiac event. Labs without leukocytosis no anemia. Basic metabolic panel without significant abnormalities. Also patient's thyroid-stimulating hormone that was done during an office visit yesterday was in normal range.  Patient feels as if it may be related to her medications. She has appointment with her cardiologist next week she is going to talk to him about the various medications. In addition patient with ambulation was asymptomatic.  Fredia Sorrow, MD

## 2016-02-19 NOTE — Addendum Note (Signed)
Addended by: Miles Costain T on: 02/19/2016 03:33 PM   Modules accepted: Orders

## 2016-02-20 ENCOUNTER — Encounter (HOSPITAL_COMMUNITY): Payer: Medicare Other

## 2016-02-20 NOTE — Progress Notes (Signed)
HPI: Follow-up mitral valve repair. Patient was in the hospital in October 2016 with congestive heart failure. Transesophageal echocardiogram showed normal LV function. There was a flail posterior leaflet with severe mitral regurgitation. There was mild to moderate tricuspid regurgitation. Cardiac catheterization October 2016 showed a 30% LAD, 60% first diagonal and 30% right coronary artery. Ejection fraction 60%. Patient subsequent had mitral valve repair. Note preoperative carotid Dopplers showed 1-39% stenosis. Note the patient had atrial fibrillation after surgery. Patient seen in the emergency room recently with dyspnea. Chest x-ray negative. Saturations normal. Since last seen, Over the past week she has noticed dyspnea. She has a cough productive of yellow sputum. There is wheezing. There is no orthopnea, PND or pedal edema. No chest pain or syncope. She was seen by primary care and a breathing treatment helped initially. Her breathing is mildly improved compared to 1 week ago.  Current Outpatient Prescriptions  Medication Sig Dispense Refill  . acetaminophen (TYLENOL) 325 MG tablet Take 2 tablets (650 mg total) by mouth every 6 (six) hours as needed for mild pain.    [name] albuterol (PROVENTIL HFA;VENTOLIN HFA) 108 (90 Base) MCG/ACT inhaler Inhale 2 puffs into the lungs every 6 (six) hours as needed for wheezing or shortness of breath. 1 Inhaler 2  . apixaban (ELIQUIS) 2.5 MG TABS tablet Take 1 tablet (2.5 mg total) by mouth 2 (two) times daily. 180 tablet 3  . aspirin EC 81 MG EC tablet Take 1 tablet (81 mg total) by mouth daily. (Patient taking differently: Take 81 mg by mouth at bedtime. )    . Cholecalciferol (VITAMIN D PO) Take 1,000 Units by mouth daily.    . CRESTOR 5 MG tablet TAKE ONE-HALF (1/2) TABLET DAILY 90 tablet 1  . DiphenhydrAMINE HCl (BENADRYL PO) Take by mouth.    . docusate sodium (COLACE) 100 MG capsule Take 100 mg by mouth daily as needed for mild constipation.    .  fish oil-omega-3 fatty acids 1000 MG capsule Take 1 g by mouth daily.     . folic acid (FOLVITE) 1 MG tablet Take 1 tablet (1 mg total) by mouth daily. 90 tablet 1  . glucosamine-chondroitin 500-400 MG tablet Take 1 tablet by mouth daily.     [name] levothyroxine (SYNTHROID, LEVOTHROID) 100 MCG tablet take 1 tablet by mouth every morning ON AN EMPTY STOMACH 90 tablet 1  . Melatonin 3 MG TABS Take 3 mg by mouth at bedtime.     . metoprolol tartrate (LOPRESSOR) 25 MG tablet Take 1 tablet (25 mg total) by mouth 2 (two) times daily. 60 tablet 1  . MULTIPLE VITAMIN PO Take 1 tablet by mouth daily. 50+ Senior Vitamin Daily    . omeprazole (PRILOSEC) 40 MG capsule TAKE 1 CAPSULE DAILY (Patient taking differently: TAKE 1 CAPSULE DAILY before dinner) 90 capsule 1  . PACERONE 200 MG tablet Take 200 mg by mouth daily. Reported on 01/15/2016    . Polyethyl Glycol-Propyl Glycol (SYSTANE OP) Place 1 drop into both eyes 2 (two) times daily. Use 1-2 Drops in both eyes    . potassium chloride SA (K-DUR,KLOR-CON) 20 MEQ tablet Take 1 tablet (20 mEq total) by mouth every other day. 45 tablet 1  . SALINE NASAL SPRAY NA Place 1 spray into both nostrils 3 (three) times daily.     . vitamin E 400 UNIT capsule Take 400 Units by mouth daily.    [name] ZETIA 10 MG tablet TAKE 1 TABLET DAILY 90  tablet 0   No current facility-administered medications for this visit.     Past Medical History  Diagnosis Date  . GERD (gastroesophageal reflux disease)   . Hyperlipidemia   . Hypothyroidism   . Osteoarthritis   . Osteoporosis   . Episodic recurrent vertigo     MRI Head 2003  . Allergy   . Bladder polyps   . Subclavian steal syndrome     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; CTA 2013 revealed open graft  . Hyperplastic colon polyp   . Esophageal spasm   . History of hepatitis     unknown type  . Asymptomatic carotid artery stenosis     R ICA 40% stenosis on CTA 12/2011.  [name] Anemia   . Cataract     BILATERAL-REMOVED    . Elevated blood pressure 03/25/2011    Bp readings borderline today has hx of elvation  In office and ok at home   Not checke recently   She gfeels was elevated from anxiety    . Diverticulosis   . Intestinal metaplasia of gastric mucosa   . Hepatic hemangioma   . CHF (congestive heart failure) [facility])     Past Surgical History  Procedure Laterality Date  . Appendectomy    . Cholecystectomy    . Carotid-subclavian bypass graft Left 1999    for [place] steal syndrome  . Rotator cuff repair Left   . Elbow surgery Left   . Cystectomy Left     hand  . Tubal ligation    . Tonsillectomy    . Colonoscopy    . Tear duct probing  07/2014  . Knee surgery Left 2014  . Tee without cardioversion N/A 10/03/2015    Procedure: TRANSESOPHAGEAL ECHOCARDIOGRAM (TEE);  Surgeon: Larey Dresser, MD;  Location: [place];  Service: Cardiovascular;  Laterality: N/A;  . Cardiac catheterization N/A 10/04/2015    Procedure: Right/Left Heart Cath and Coronary Angiography;  Surgeon: Belva Crome, MD;  Location: [place] [facility];  Service: Cardiovascular;  Laterality: N/A;  . Mitral valve repair N/A 10/10/2015    Procedure: MITRAL VALVE REPAIR (MVR) WITH SIZE 28 CARPENTIER-EDWARDS PHYSIO II ANNULOPLASTY RING;  Surgeon: [name], MD;  Location: [place];  Service: Open Heart Surgery;  Laterality: N/A;  . Tee without cardioversion N/A 10/10/2015    Procedure: TRANSESOPHAGEAL ECHOCARDIOGRAM (TEE);  Surgeon: [name], MD;  Location: [place];  Service: Open Heart Surgery;  Laterality: N/A;    Social History   Social History  . Marital Status: Widowed    Spouse Name: N/A  . Number of Children: 4  . Years of Education: N/A   Occupational History  . retired    Social History Main Topics  . Smoking status: Former Smoker    Quit date: 01/05/1991  . Smokeless tobacco: Never Used  . Alcohol Use: 2.4 oz/week    4 Glasses of wine per week     Comment: socially  . Drug Use: No  . Sexual  Activity: Not on file   Other Topics Concern  . Not on file   Social History Narrative   Widowed   HH of 1    No pets   Former smoker   Exercises regularly    Family History  Problem Relation Age of Onset  . Hypertension Mother   . Heart disease Father   . Stroke Mother   . Colon cancer Neg Hx     ROS: no fevers  or chills, productive cough, hemoptysis, dysphasia, odynophagia, melena, hematochezia, dysuria, hematuria, rash, seizure activity, orthopnea, PND, pedal edema, claudication. Remaining systems are negative.  Physical Exam: Well-developed well-nourished in no acute distress.  Skin is warm and dry.  HEENT is normal.  Neck is supple.  Chest Diffuse expiratory wheeze Cardiovascular exam is regular rate and rhythm.  Abdominal exam nontender or distended. No masses palpated. Extremities show no edema. neuro grossly intact  ECG Sinus rhythm at a rate of 88. No ST changes.

## 2016-02-21 ENCOUNTER — Other Ambulatory Visit: Payer: Self-pay

## 2016-02-21 NOTE — Patient Outreach (Signed)
[facility]) Care Management  [place]  02/21/2016   Amy Fischer 1929/08/26 [id number]  Subjective: Telephone call to patient to complete initial assessment.  Patient reports she is having some problems with shortness of breath and wheezing. Patient reports going to urgent care on Saturday, ER on Sunday, and then to her primary office on Tuesday.  Patient reports she was given a steroid injection and nebulizer.  Patient reports that she thinks she is some better and the doctor thinks that it is bronchitis.  No fever or chills. Patient reports she is to see the cardiologist on tomorrow but states that she called the cardiologist office earlier this week and that the doctor did not think it was her heart. Patient denies any swelling or weight gain.  Discussed with patient signs of respiratory infection, when to notify physician, and self care.  She verbalized understanding.    Objective:   Current Medications:  Current Outpatient Prescriptions  Medication Sig Dispense Refill  . acetaminophen (TYLENOL) 325 MG tablet Take 2 tablets (650 mg total) by mouth every 6 (six) hours as needed for mild pain.    [name] albuterol (PROVENTIL HFA;VENTOLIN HFA) 108 (90 Base) MCG/ACT inhaler Inhale 2 puffs into the lungs every 6 (six) hours as needed for wheezing or shortness of breath. 1 Inhaler 2  . apixaban (ELIQUIS) 2.5 MG TABS tablet Take 1 tablet (2.5 mg total) by mouth 2 (two) times daily. 180 tablet 3  . aspirin EC 81 MG EC tablet Take 1 tablet (81 mg total) by mouth daily. (Patient taking differently: Take 81 mg by mouth at bedtime. )    . Cholecalciferol (VITAMIN D PO) Take 1,000 Units by mouth daily.    . CRESTOR 5 MG tablet TAKE ONE-HALF (1/2) TABLET DAILY 90 tablet 1  . DiphenhydrAMINE HCl (BENADRYL PO) Take by mouth.    . docusate sodium (COLACE) 100 MG capsule Take 100 mg by mouth daily as needed for mild constipation.    [name] ezetimibe (ZETIA) 10 MG tablet Take 1 tablet (10  mg total) by mouth daily. (Patient taking differently: Take 10 mg by mouth at bedtime. ) 30 tablet 0  . fish oil-omega-3 fatty acids 1000 MG capsule Take 1 g by mouth daily.     . folic acid (FOLVITE) 1 MG tablet Take 1 tablet (1 mg total) by mouth daily. 90 tablet 1  . glucosamine-chondroitin 500-400 MG tablet Take 1 tablet by mouth daily.     [name] levothyroxine (SYNTHROID, LEVOTHROID) 100 MCG tablet take 1 tablet by mouth every morning ON AN EMPTY STOMACH 90 tablet 1  . Melatonin 3 MG TABS Take 3 mg by mouth at bedtime.     . metoprolol tartrate (LOPRESSOR) 25 MG tablet Take 1 tablet (25 mg total) by mouth 2 (two) times daily. 60 tablet 1  . MULTIPLE VITAMIN PO Take 1 tablet by mouth daily. 50+ Senior Vitamin Daily    . omeprazole (PRILOSEC) 40 MG capsule TAKE 1 CAPSULE DAILY (Patient taking differently: TAKE 1 CAPSULE DAILY before dinner) 90 capsule 1  . PACERONE 200 MG tablet Take 200 mg by mouth daily. Reported on 01/15/2016    . Polyethyl Glycol-Propyl Glycol (SYSTANE OP) Place 1 drop into both eyes 2 (two) times daily. Use 1-2 Drops in both eyes    . potassium chloride SA (K-DUR,KLOR-CON) 20 MEQ tablet Take 1 tablet (20 mEq total) by mouth every other day. 45 tablet 1  . SALINE NASAL SPRAY NA Place 1  spray into both nostrils 3 (three) times daily.     . vitamin E 400 UNIT capsule Take 400 Units by mouth daily.    [name] ZETIA 10 MG tablet TAKE 1 TABLET DAILY 90 tablet 0   No current facility-administered medications for this visit.    Functional Status:  In your present state of health, do you have any difficulty performing the following activities: 01/31/2016 11/07/2015  Hearing? N N  Vision? N N  Difficulty concentrating or making decisions? N Y  Walking or climbing stairs? Y Y  Dressing or bathing? N N  Doing errands, shopping? [name]  Preparing Food and eating ? N N  Using the Toilet? N N  In the past six months, have you accidently leaked urine? N N  Do you have problems with loss of bowel  control? N N  Managing your Medications? N Y  Managing your Finances? N N  Housekeeping or managing your Housekeeping? [name]    Fall/Depression Screening: PHQ 2/9 Scores 02/21/2016 01/31/2016 10/29/2015 07/26/2014  PHQ - 2 Score 0 0 5 2  PHQ- 9 Score - - 14 -    Assessment: Patient will benefit from health coach outreach for disease management and support.   Plan:  [facility] CM Care Plan Problem One        Most Recent Value   Care Plan Problem One  Heart Failure knowledge deficit   Role Documenting the Problem One  Health Coach   Care Plan for Problem One  Active   THN Long Term Goal (31-90 days)  Patient will verbalize signs and symptoms of heart failure within 90 days.   THN Long Term Goal Start Date  01/31/16   Interventions for Problem One Long Term Goal  RN Health Coach reviewed signs and symptoms of heart failure with patient.   THN CM Short Term Goal #1 (0-30 days)  Patient will verbalize yellow zone of heart failure chart within 30 days   THN CM Short Term Goal #1 Start Date  02/21/16   Interventions for Short Term Goal #1  RN Health Coach discussed yellow zone of heart failure chart with patient.   THN CM Short Term Goal #2 (0-30 days)  Patient will be able to verbalize 3 foods high in sodium within 30 days   THN CM Short Term Goal #2 Start Date  02/21/16   Interventions for Short Term Goal #2  [place] reviewed with patient foods high in sodium.       RN Health Coach will provide ongoing education for patient on heart failure through phone calls and sending printed information to patient for further discussion.  RN Health Coach will send welcome packet with to patient as well as printed information on health failure.  RN Health Coach will send initial barriers letter, assessment, and care plan to primary care physician.  RN Health Coach will contact patient within one month and patient agrees to next contact.   Jone Baseman, RN, MSN [place] [phone]

## 2016-02-22 ENCOUNTER — Encounter (HOSPITAL_COMMUNITY): Payer: Medicare Other

## 2016-02-22 ENCOUNTER — Encounter: Payer: Self-pay | Admitting: Cardiology

## 2016-02-22 ENCOUNTER — Ambulatory Visit (INDEPENDENT_AMBULATORY_CARE_PROVIDER_SITE_OTHER): Payer: Medicare Other | Admitting: Cardiology

## 2016-02-22 VITALS — BP 120/68 | HR 88 | Ht 62.0 in | Wt 112.0 lb

## 2016-02-22 DIAGNOSIS — IMO0001 Reserved for inherently not codable concepts without codable children: Secondary | ICD-10-CM

## 2016-02-22 DIAGNOSIS — I2510 Atherosclerotic heart disease of native coronary artery without angina pectoris: Secondary | ICD-10-CM | POA: Diagnosis not present

## 2016-02-22 DIAGNOSIS — I480 Paroxysmal atrial fibrillation: Secondary | ICD-10-CM | POA: Diagnosis not present

## 2016-02-22 DIAGNOSIS — Z9889 Other specified postprocedural states: Secondary | ICD-10-CM | POA: Diagnosis not present

## 2016-02-22 DIAGNOSIS — R0602 Shortness of breath: Secondary | ICD-10-CM

## 2016-02-22 DIAGNOSIS — I5032 Chronic diastolic (congestive) heart failure: Secondary | ICD-10-CM | POA: Diagnosis not present

## 2016-02-22 DIAGNOSIS — R030 Elevated blood-pressure reading, without diagnosis of hypertension: Secondary | ICD-10-CM

## 2016-02-22 DIAGNOSIS — I2583 Coronary atherosclerosis due to lipid rich plaque: Secondary | ICD-10-CM

## 2016-02-22 MED ORDER — AZITHROMYCIN 250 MG PO TABS: ORAL_TABLET | ORAL | Status: DC

## 2016-02-22 NOTE — Assessment & Plan Note (Addendum)
Continue aspirin, crestor and zetia.

## 2016-02-22 NOTE — Assessment & Plan Note (Signed)
Patient is short of breath.This is likely pulmonary related as she has a productive cough and is wheezing. We will add a Z-Pak. I will discontinue Toprol to see if this is contributing. She is to follow-up with primary care next week. Check BNP but patient not volume overloaded.

## 2016-02-22 NOTE — Assessment & Plan Note (Signed)
We are discontinuing beta blocker because of wheezing. Watch blood pressure and adjust regimen as needed.

## 2016-02-22 NOTE — Assessment & Plan Note (Signed)
Patient had postoperative atrial fibrillation. She remains in sinus rhythm. Her amiodarone was discontinued previously. We will discontinue apixaban.

## 2016-02-22 NOTE — Assessment & Plan Note (Signed)
Continue SBE prophylaxis. 

## 2016-02-22 NOTE — Patient Instructions (Signed)
Medication Instructions:   STOP ELIQUIS  STOP METOPROLOL  ZITHROMAX AS DIRECTED  Labwork:  Your physician recommends that you HAVE LAB WORK TODAY  Follow-Up:  Your physician recommends that you schedule a follow-up appointment in: [place]

## 2016-02-22 NOTE — Assessment & Plan Note (Addendum)
Continue zetia and crestor  

## 2016-02-22 NOTE — Assessment & Plan Note (Signed)
Continue present dose of Lasix. 

## 2016-02-23 LAB — BRAIN NATRIURETIC PEPTIDE: BRAIN NATRIURETIC PEPTIDE: 115.1 pg/mL — AB (ref <100)

## 2016-02-25 ENCOUNTER — Encounter (HOSPITAL_COMMUNITY): Payer: Medicare Other

## 2016-02-25 ENCOUNTER — Telehealth (HOSPITAL_COMMUNITY): Payer: Self-pay | Admitting: *Deleted

## 2016-02-26 ENCOUNTER — Ambulatory Visit (INDEPENDENT_AMBULATORY_CARE_PROVIDER_SITE_OTHER): Payer: Medicare Other | Admitting: Internal Medicine

## 2016-02-26 ENCOUNTER — Telehealth: Payer: Self-pay | Admitting: Family Medicine

## 2016-02-26 ENCOUNTER — Encounter: Payer: Self-pay | Admitting: Internal Medicine

## 2016-02-26 ENCOUNTER — Telehealth: Payer: Self-pay

## 2016-02-26 VITALS — BP 136/80 | HR 97 | Temp 97.6°F | Ht 62.0 in | Wt 114.0 lb

## 2016-02-26 DIAGNOSIS — I2510 Atherosclerotic heart disease of native coronary artery without angina pectoris: Secondary | ICD-10-CM

## 2016-02-26 DIAGNOSIS — J40 Bronchitis, not specified as acute or chronic: Secondary | ICD-10-CM

## 2016-02-26 DIAGNOSIS — IMO0001 Reserved for inherently not codable concepts without codable children: Secondary | ICD-10-CM

## 2016-02-26 DIAGNOSIS — R54 Age-related physical debility: Secondary | ICD-10-CM

## 2016-02-26 DIAGNOSIS — I10 Essential (primary) hypertension: Secondary | ICD-10-CM

## 2016-02-26 DIAGNOSIS — J3489 Other specified disorders of nose and nasal sinuses: Secondary | ICD-10-CM | POA: Diagnosis not present

## 2016-02-26 DIAGNOSIS — R0600 Dyspnea, unspecified: Secondary | ICD-10-CM | POA: Diagnosis not present

## 2016-02-26 DIAGNOSIS — E039 Hypothyroidism, unspecified: Secondary | ICD-10-CM

## 2016-02-26 MED ORDER — BUDESONIDE-FORMOTEROL FUMARATE 160-4.5 MCG/ACT IN AERO: 2.0000 | INHALATION_SPRAY | Freq: Two times a day (BID) | RESPIRATORY_TRACT | Status: DC

## 2016-02-26 MED ORDER — IPRATROPIUM BROMIDE 0.03 % NA SOLN: 2.0000 | Freq: Three times a day (TID) | NASAL | Status: DC | PRN

## 2016-02-26 NOTE — Telephone Encounter (Signed)
Prior authorization was submitted via covermymeds for patient. The key for the PA is [id number].

## 2016-02-26 NOTE — Telephone Encounter (Signed)
Pt was seen in the office today and prescribed Proventil/Ventolin.  Medicare will not pay for this.  Need a rx for ProAir.  Okay to change or did you prescribe Proventil/Ventolin for a purpose?

## 2016-02-26 NOTE — Telephone Encounter (Signed)
Pro air  Is ok with me can switch

## 2016-02-26 NOTE — Patient Instructions (Signed)
The antibiotic is still in system   You chest sounds better but has a way to go  Add daily inhaler as discussed .   Try   Different nose spray . Than astelin

## 2016-02-26 NOTE — Progress Notes (Signed)
Pre visit review using our clinic review tool, if applicable. No additional management support is needed unless otherwise documented below in the visit note.  Chief Complaint  Patient presents with  . Follow-up    HPI: Amy Fischer  82 y.o. comes in for fu of   Mult med problems  But mostly dypsdea  rx with steroids b a bronchodilator   Since last check she has seen Dr Stanford Breed  For her fu paf    Has had toprol  And eliquis  dced   And z pack added  fo rher wheezing and sob   Not felt to be from  cv cause .  Since that time  Now sores nose still runs  And sog  House to car  Eating ok    Living by self   No falling  Now   And .   Sob house to car.    Card rehab is on hold for now.  Coughing still   No sig phlegm but looser  Taking  albuterol qid and some help  Hx of amiodarone in past  No hx of CLD   Per pat  Remote hx of allergy shots ( of note high tree pollen  today )  ROS: See pertinent positives and negatives per HPI. No glaucoma Past Medical History  Diagnosis Date  . GERD (gastroesophageal reflux disease)   . Hyperlipidemia   . Hypothyroidism   . Osteoarthritis   . Osteoporosis   . Episodic recurrent vertigo     MRI Head 2003  . Allergy   . Bladder polyps   . Subclavian steal syndrome     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; CTA 2013 revealed open graft  . Hyperplastic colon polyp   . Esophageal spasm   . History of hepatitis     unknown type  . Asymptomatic carotid artery stenosis     R ICA 40% stenosis on CTA 12/2011.  [name] Anemia   . Cataract     BILATERAL-REMOVED  . Elevated blood pressure 03/25/2011    Bp readings borderline today has hx of elvation  In office and ok at home   Not checke recently   She gfeels was elevated from anxiety    . Diverticulosis   . Intestinal metaplasia of gastric mucosa   . Hepatic hemangioma   . CHF (congestive heart failure) (HCC)     Family History  Problem Relation Age of Onset  . Hypertension Mother   . Heart  disease Father   . Stroke Mother   . Colon cancer Neg Hx     Social History   Social History  . Marital Status: Widowed    Spouse Name: N/A  . Number of Children: 4  . Years of Education: N/A   Occupational History  . retired    Social History Main Topics  . Smoking status: Former Smoker    Quit date: 01/05/1991  . Smokeless tobacco: Never Used  . Alcohol Use: 2.4 oz/week    4 Glasses of wine per week     Comment: socially  . Drug Use: No  . Sexual Activity: Not Asked   Other Topics Concern  . None   Social History Narrative   Widowed   HH of 1    No pets   Former smoker   Exercises regularly    Outpatient Prescriptions Prior to Visit  Medication Sig Dispense Refill  . acetaminophen (TYLENOL) 325 MG tablet Take 2 tablets (  650 mg total) by mouth every 6 (six) hours as needed for mild pain.    [name] albuterol (PROVENTIL HFA;VENTOLIN HFA) 108 (90 Base) MCG/ACT inhaler Inhale 2 puffs into the lungs every 6 (six) hours as needed for wheezing or shortness of breath. 1 Inhaler 2  . aspirin EC 81 MG EC tablet Take 1 tablet (81 mg total) by mouth daily. (Patient taking differently: Take 81 mg by mouth at bedtime. )    . azithromycin (ZITHROMAX Z-PAK) 250 MG tablet As directed 6 each 0  . Cholecalciferol (VITAMIN D PO) Take 1,000 Units by mouth daily.    . CRESTOR 5 MG tablet TAKE ONE-HALF (1/2) TABLET DAILY 90 tablet 1  . DiphenhydrAMINE HCl (BENADRYL PO) Take by mouth.    . docusate sodium (COLACE) 100 MG capsule Take 100 mg by mouth daily as needed for mild constipation.    . fish oil-omega-3 fatty acids 1000 MG capsule Take 1 g by mouth daily.     . folic acid (FOLVITE) 1 MG tablet Take 1 tablet (1 mg total) by mouth daily. 90 tablet 1  . glucosamine-chondroitin 500-400 MG tablet Take 1 tablet by mouth daily.     [name] levothyroxine (SYNTHROID, LEVOTHROID) 100 MCG tablet take 1 tablet by mouth every morning ON AN EMPTY STOMACH 90 tablet 1  . Melatonin 3 MG TABS Take 3 mg by mouth  at bedtime.     . MULTIPLE VITAMIN PO Take 1 tablet by mouth daily. 50+ Senior Vitamin Daily    . omeprazole (PRILOSEC) 40 MG capsule TAKE 1 CAPSULE DAILY (Patient taking differently: TAKE 1 CAPSULE DAILY before dinner) 90 capsule 1  . PACERONE 200 MG tablet Take 200 mg by mouth daily. Reported on 01/15/2016    . Polyethyl Glycol-Propyl Glycol (SYSTANE OP) Place 1 drop into both eyes 2 (two) times daily. Use 1-2 Drops in both eyes    . potassium chloride SA (K-DUR,KLOR-CON) 20 MEQ tablet Take 1 tablet (20 mEq total) by mouth every other day. 45 tablet 1  . SALINE NASAL SPRAY NA Place 1 spray into both nostrils 3 (three) times daily.     . vitamin E 400 UNIT capsule Take 400 Units by mouth daily.    [name] ZETIA 10 MG tablet TAKE 1 TABLET DAILY 90 tablet 0   No facility-administered medications prior to visit.     EXAM:  BP 136/80 mmHg  Pulse 97  Temp(Src) 97.6 F (36.4 C) (Oral)  Ht 5\' 2"  (1.575 m)  Wt 114 lb (51.71 kg)  BMI 20.85 kg/m2  SpO2 96%  Body mass index is 20.85 kg/(m^2).  GENERAL: vitals reviewed and listed above, alert, oriented, appears well hydrated and in no acute distress  Rhinorrhea with looser brohciual cough  Quiet resp at rest  HEENT: atraumatic, conjunctiva  clear, no obvious abnormalities on inspection of external nose and ears clear rhinorrhea irritated  NECK: no obvious masses on inspection palpation  LUNGS: clear to auscultation bilaterally, no wheezes, rales or rhonchi,  Better  aire moivement   No restraction speech better at rest  CV: HRRR, no clubbing cyanosis or  peripheral edema nl cap refill  MS: moves all extremities djd changes PSYCH: pleasant and cooperative, no obvious depression or anxiety cognition intact  Lab Results  Component Value Date   TSH 0.94 02/15/2016    ASSESSMENT AND PLAN:  Discussed the following assessment and plan: Wheezy bronchitis  Rhinorrhea  Dyspnea  Hypothyroidism, unspecified hypothyroidism type - in range   Age  factor  Hypertension, isolated systolic Last day of antibiotic .  Antihistamine not that helpful  In past  For nose  And allergy sx   astelin not work and caused nose sores.  Over all looks better  But still has sig bronchial cough and rhinorrhea    Chest  exam better   Still dypneaic .  Remote hx of allergy shots   This seems to be triggered  infecious but we are in allergy   High pollen tree at this time  Trial  atrovent nose spray   Add symbicort   rov in 3-4 weeks  If  persistent or progressive considier over evaluation  Sores in nose  And  Had more  -Patient advised to return or notify health care team  if symptoms worsen ,persist or new concerns arise. Total visit 23mins > 50% spent counseling and coordinating care as indicated in above note and in instructions to patient .   Patient Instructions  The antibiotic is still in system   You chest sounds better but has a way to go  Add daily inhaler as discussed .   Try   Different nose spray . Than astelin         Mariann Laster K. Mirza Fessel M.D.

## 2016-02-27 ENCOUNTER — Encounter (HOSPITAL_COMMUNITY): Payer: Medicare Other

## 2016-02-27 ENCOUNTER — Telehealth: Payer: Self-pay | Admitting: Internal Medicine

## 2016-02-27 MED ORDER — PROAIR HFA 108 (90 BASE) MCG/ACT IN AERS: INHALATION_SPRAY | RESPIRATORY_TRACT | Status: DC

## 2016-02-27 NOTE — Telephone Encounter (Signed)
Pt went to pick up PROAIR HFA 108 (90 Base) MCG/ACT inhaler But pharmacy would not give to her, they told her it is one like she already has.  Advised pt she may need to call her insurance company and find out what they will cover, but she   said she would not know how /what to do.  [facility]/ [address]

## 2016-02-27 NOTE — Telephone Encounter (Signed)
Pt notified to pick up at the pharmacy. 

## 2016-02-29 ENCOUNTER — Encounter (HOSPITAL_COMMUNITY): Payer: Medicare Other

## 2016-02-29 NOTE — Telephone Encounter (Signed)
Spoke to the pharmacy.  Pt has picked up a prescription for ProAir.  The Symbicort required authorization.  Will send to start prior auth

## 2016-03-03 ENCOUNTER — Encounter (HOSPITAL_COMMUNITY): Payer: Medicare Other

## 2016-03-03 NOTE — Telephone Encounter (Signed)
Pt notified she can go to the pharmacy and pick up rx.

## 2016-03-03 NOTE — Telephone Encounter (Signed)
Left a message for a return call.

## 2016-03-03 NOTE — Telephone Encounter (Signed)
PA for Symbicort has been submitted and was approved.

## 2016-03-05 ENCOUNTER — Encounter (HOSPITAL_COMMUNITY): Payer: Medicare Other

## 2016-03-07 ENCOUNTER — Encounter (HOSPITAL_COMMUNITY): Payer: Medicare Other

## 2016-03-10 ENCOUNTER — Encounter (HOSPITAL_COMMUNITY): Payer: Medicare Other

## 2016-03-12 ENCOUNTER — Encounter (HOSPITAL_COMMUNITY): Payer: Medicare Other

## 2016-03-14 ENCOUNTER — Encounter (HOSPITAL_COMMUNITY): Payer: Medicare Other

## 2016-03-17 ENCOUNTER — Encounter (HOSPITAL_COMMUNITY): Payer: Medicare Other

## 2016-03-18 ENCOUNTER — Other Ambulatory Visit: Payer: Self-pay

## 2016-03-18 NOTE — Patient Outreach (Signed)
[facility]) Care Management  03/18/2016  FALONDA MCCLUSKEY [birth year] [id number]   Telephone call to patient for monthly call.  Patient reports she is busy.  Advised patient that health coach would call another time. She is agreeable  Plan: Ajo will contact patient within 1-2 weeks and patient agrees to next outreach.    Jone Baseman, RN, MSN [place] [phone]

## 2016-03-19 ENCOUNTER — Encounter (HOSPITAL_COMMUNITY): Payer: Medicare Other

## 2016-03-21 ENCOUNTER — Other Ambulatory Visit: Payer: Self-pay

## 2016-03-21 ENCOUNTER — Encounter (HOSPITAL_COMMUNITY): Payer: Medicare Other

## 2016-03-21 NOTE — Patient Outreach (Signed)
[facility]) Care Management  [place]  03/21/2016   Amy Fischer April 28, 1929 [id number]  Subjective: Telephone call to patient for monthly call. Patient reports she is doing better.  She reports she had some problems with bronchitis but is over that now. Patient reports she currently is not going to cardiac rehab until she sees Dr. Stanford Fischer.  However patient reports she is exercising regularly to maintain. Patient reports she currently is not weighing but able to describe signs and symptoms of heart failure.  Discussed with patient importance of weights. She verbalized understanding.    Objective:   Current Medications:  Current Outpatient Prescriptions  Medication Sig Dispense Refill  . acetaminophen (TYLENOL) 325 MG tablet Take 2 tablets (650 mg total) by mouth every 6 (six) hours as needed for mild pain.    [name] aspirin EC 81 MG EC tablet Take 1 tablet (81 mg total) by mouth daily. (Patient taking differently: Take 81 mg by mouth at bedtime. )    . budesonide-formoterol (SYMBICORT) 160-4.5 MCG/ACT inhaler Inhale 2 puffs into the lungs 2 (two) times daily. Or as directed 1 Inhaler 2  . Cholecalciferol (VITAMIN D PO) Take 1,000 Units by mouth daily.    . CRESTOR 5 MG tablet TAKE ONE-HALF (1/2) TABLET DAILY 90 tablet 1  . DiphenhydrAMINE HCl (BENADRYL PO) Take by mouth.    . docusate sodium (COLACE) 100 MG capsule Take 100 mg by mouth daily as needed for mild constipation.    . fish oil-omega-3 fatty acids 1000 MG capsule Take 1 g by mouth daily.     . folic acid (FOLVITE) 1 MG tablet Take 1 tablet (1 mg total) by mouth daily. 90 tablet 1  . glucosamine-chondroitin 500-400 MG tablet Take 1 tablet by mouth daily.     [name] ipratropium (ATROVENT) 0.03 % nasal spray Place 2 sprays into both nostrils 3 (three) times daily as needed for rhinitis. 30 mL 3  . levothyroxine (SYNTHROID, LEVOTHROID) 100 MCG tablet take 1 tablet by mouth every morning ON AN EMPTY STOMACH 90 tablet  1  . Melatonin 3 MG TABS Take 3 mg by mouth at bedtime.     . MULTIPLE VITAMIN PO Take 1 tablet by mouth daily. 50+ Senior Vitamin Daily    . omeprazole (PRILOSEC) 40 MG capsule TAKE 1 CAPSULE DAILY (Patient taking differently: TAKE 1 CAPSULE DAILY before dinner) 90 capsule 1  . PACERONE 200 MG tablet Take 200 mg by mouth daily. Reported on 01/15/2016    . Polyethyl Glycol-Propyl Glycol (SYSTANE OP) Place 1 drop into both eyes 2 (two) times daily. Use 1-2 Drops in both eyes    . potassium chloride SA (K-DUR,KLOR-CON) 20 MEQ tablet Take 1 tablet (20 mEq total) by mouth every other day. 45 tablet 1  . PROAIR HFA 108 (90 Base) MCG/ACT inhaler Inhale 2 puffs into the lungs every 6 (six) hours as needed for wheezing or shortness of breath 1 Inhaler 2  . SALINE NASAL SPRAY NA Place 1 spray into both nostrils 3 (three) times daily.     . vitamin E 400 UNIT capsule Take 400 Units by mouth daily.    [name] ZETIA 10 MG tablet TAKE 1 TABLET DAILY 90 tablet 0  . azithromycin (ZITHROMAX Z-PAK) 250 MG tablet As directed (Patient not taking: Reported on 03/21/2016) 6 each 0   No current facility-administered medications for this visit.    Functional Status:  In your present state of health, do you have any difficulty  performing the following activities: 01/31/2016 11/07/2015  Hearing? N N  Vision? N N  Difficulty concentrating or making decisions? N Y  Walking or climbing stairs? Y Y  Dressing or bathing? N N  Doing errands, shopping? [name]  Preparing Food and eating ? N N  Using the Toilet? N N  In the past six months, have you accidently leaked urine? N N  Do you have problems with loss of bowel control? N N  Managing your Medications? N Y  Managing your Finances? N N  Housekeeping or managing your Housekeeping? [name]    Fall/Depression Screening: PHQ 2/9 Scores 03/21/2016 02/21/2016 01/31/2016 10/29/2015 07/26/2014  PHQ - 2 Score 0 0 0 5 2  PHQ- 9 Score - - - 14 -    Assessment: Patient continues to benefit  from health coach outreach for disease management and support.  Plan:  Amy Fischer CM Care Plan Problem One        Most Recent Value   Care Plan Problem One  Heart Failure knowledge deficit   Role Documenting the Problem One  Health Coach   Care Plan for Problem One  Active   THN Long Term Goal (31-90 days)  Patient will verbalize signs and symptoms of heart failure within 90 days.   THN Long Term Goal Start Date  01/31/16   Interventions for Problem One [place] reinforced signs and symptoms of heart failure with patient.   THN CM Short Term Goal #1 (0-30 days)  Patient will verbalize yellow zone of heart failure chart within 30 days   THN CM Short Term Goal #1 Start Date  03/21/16 [goal continued]   Interventions for Short Term Goal #1  RN Health Coach reinforced yellow zone of heart failure chart with patient.   THN CM Short Term Goal #2 (0-30 days)  Patient will be able to verbalize 3 foods high in sodium within 30 days   THN CM Short Term Goal #2 Start Date  03/21/16 [name] continued]   Interventions for Short Term Goal #2  [place] reinforced with patient foods high in sodium.       RN Health Coach will contact patient within one month and patient agrees to next outreach.   Jone Baseman, RN, MSN [place] [phone]

## 2016-03-24 ENCOUNTER — Encounter (HOSPITAL_COMMUNITY): Payer: Medicare Other

## 2016-03-25 ENCOUNTER — Ambulatory Visit (INDEPENDENT_AMBULATORY_CARE_PROVIDER_SITE_OTHER): Payer: Medicare Other | Admitting: Internal Medicine

## 2016-03-25 ENCOUNTER — Encounter: Payer: Self-pay | Admitting: Internal Medicine

## 2016-03-25 VITALS — BP 100/60 | HR 94 | Temp 97.4°F | Wt 115.3 lb

## 2016-03-25 DIAGNOSIS — I2510 Atherosclerotic heart disease of native coronary artery without angina pectoris: Secondary | ICD-10-CM | POA: Diagnosis not present

## 2016-03-25 DIAGNOSIS — M79606 Pain in leg, unspecified: Secondary | ICD-10-CM

## 2016-03-25 DIAGNOSIS — J3489 Other specified disorders of nose and nasal sinuses: Secondary | ICD-10-CM | POA: Diagnosis not present

## 2016-03-25 DIAGNOSIS — I5032 Chronic diastolic (congestive) heart failure: Secondary | ICD-10-CM | POA: Diagnosis not present

## 2016-03-25 DIAGNOSIS — J40 Bronchitis, not specified as acute or chronic: Secondary | ICD-10-CM

## 2016-03-25 NOTE — Patient Instructions (Signed)
Can decrease the symbicort to 1-2 puffs per day  For 1-2 weeks and then off  Call if feel need to go back on  As you insurance wont cover   And wants to use adviar products . Ok to use the nose spray as needed.   Hydration and exercise    Leg pain sounds like a cramp but   Sensation like a nerve.  Episode  To see  If helps .  If  persistent or progressive get back with [place] about this.   ROV in 3-4 months or as needed

## 2016-03-25 NOTE — Progress Notes (Signed)
Pre visit review using our clinic review tool, if applicable. No additional management support is needed unless otherwise documented below in the visit note. 

## 2016-03-25 NOTE — Progress Notes (Signed)
Chief Complaint  Patient presents with  . Follow-up  . Leg Pain    burning sensation    HPI: Amy Fischer 82 y.o.  comesin for fu  Lungs much better  ? Need to cointinue .  Nose better on atrovent but bleeds  Some  Dec dose is ok .   Intermittent sever bilateral burning 20 - 30  Lateral distal leg pain  At night first got in ed and then once in hopst   Had 3 x a night recently   Not like  A cramp? Hard to walk when happend no  incitors or reolvers happens off on like a switch  No edema and not the whole leg   To go back to card rehab?  Trying to walk more .  ROS: See pertinent positives and negatives per HPI.  Past Medical History  Diagnosis Date  . GERD (gastroesophageal reflux disease)   . Hyperlipidemia   . Hypothyroidism   . Osteoarthritis   . Osteoporosis   . Episodic recurrent vertigo     MRI Head 2003  . Allergy   . Bladder polyps   . Subclavian steal syndrome     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; CTA 2013 revealed open graft  . Hyperplastic colon polyp   . Esophageal spasm   . History of hepatitis     unknown type  . Asymptomatic carotid artery stenosis     R ICA 40% stenosis on CTA 12/2011.  [name] Anemia   . Cataract     BILATERAL-REMOVED  . Elevated blood pressure 03/25/2011    Bp readings borderline today has hx of elvation  In office and ok at home   Not checke recently   She gfeels was elevated from anxiety    . Diverticulosis   . Intestinal metaplasia of gastric mucosa   . Hepatic hemangioma   . CHF (congestive heart failure) (HCC)     Family History  Problem Relation Age of Onset  . Hypertension Mother   . Heart disease Father   . Stroke Mother   . Colon cancer Neg Hx     Social History   Social History  . Marital Status: Widowed    Spouse Name: N/A  . Number of Children: 4  . Years of Education: N/A   Occupational History  . retired    Social History Main Topics  . Smoking status: Former Smoker    Quit date: 01/05/1991    . Smokeless tobacco: Never Used  . Alcohol Use: 2.4 oz/week    4 Glasses of wine per week     Comment: socially  . Drug Use: No  . Sexual Activity: Not Asked   Other Topics Concern  . None   Social History Narrative   Widowed   HH of 1    No pets   Former smoker   Exercises regularly    Outpatient Prescriptions Prior to Visit  Medication Sig Dispense Refill  . acetaminophen (TYLENOL) 325 MG tablet Take 2 tablets (650 mg total) by mouth every 6 (six) hours as needed for mild pain.    [name] aspirin EC 81 MG EC tablet Take 1 tablet (81 mg total) by mouth daily. (Patient taking differently: Take 81 mg by mouth at bedtime. )    . budesonide-formoterol (SYMBICORT) 160-4.5 MCG/ACT inhaler Inhale 2 puffs into the lungs 2 (two) times daily. Or as directed 1 Inhaler 2  . Cholecalciferol (VITAMIN D PO) Take 1,000 Units  by mouth daily.    . CRESTOR 5 MG tablet TAKE ONE-HALF (1/2) TABLET DAILY 90 tablet 1  . DiphenhydrAMINE HCl (BENADRYL PO) Take by mouth.    . docusate sodium (COLACE) 100 MG capsule Take 100 mg by mouth daily as needed for mild constipation.    . fish oil-omega-3 fatty acids 1000 MG capsule Take 1 g by mouth daily.     . folic acid (FOLVITE) 1 MG tablet Take 1 tablet (1 mg total) by mouth daily. 90 tablet 1  . glucosamine-chondroitin 500-400 MG tablet Take 1 tablet by mouth daily.     [name] ipratropium (ATROVENT) 0.03 % nasal spray Place 2 sprays into both nostrils 3 (three) times daily as needed for rhinitis. 30 mL 3  . levothyroxine (SYNTHROID, LEVOTHROID) 100 MCG tablet take 1 tablet by mouth every morning ON AN EMPTY STOMACH 90 tablet 1  . Melatonin 3 MG TABS Take 3 mg by mouth at bedtime.     . MULTIPLE VITAMIN PO Take 1 tablet by mouth daily. 50+ Senior Vitamin Daily    . omeprazole (PRILOSEC) 40 MG capsule TAKE 1 CAPSULE DAILY (Patient taking differently: TAKE 1 CAPSULE DAILY before dinner) 90 capsule 1  . Polyethyl Glycol-Propyl Glycol (SYSTANE OP) Place 1 drop into both  eyes 2 (two) times daily. Use 1-2 Drops in both eyes    . potassium chloride SA (K-DUR,KLOR-CON) 20 MEQ tablet Take 1 tablet (20 mEq total) by mouth every other day. 45 tablet 1  . PROAIR HFA 108 (90 Base) MCG/ACT inhaler Inhale 2 puffs into the lungs every 6 (six) hours as needed for wheezing or shortness of breath 1 Inhaler 2  . SALINE NASAL SPRAY NA Place 1 spray into both nostrils 3 (three) times daily.     . vitamin E 400 UNIT capsule Take 400 Units by mouth daily.    [name] ZETIA 10 MG tablet TAKE 1 TABLET DAILY 90 tablet 0  . PACERONE 200 MG tablet Take 200 mg by mouth daily. Reported on 01/15/2016    . azithromycin (ZITHROMAX Z-PAK) 250 MG tablet As directed (Patient not taking: Reported on 03/21/2016) 6 each 0   No facility-administered medications prior to visit.     EXAM:  BP 100/60 mmHg  Pulse 94  Temp(Src) 97.4 F (36.3 C) (Oral)  Wt 115 lb 4.8 oz (52.3 kg)  Body mass index is 21.08 kg/(m^2).  GENERAL: vitals reviewed and listed above, alert, oriented, appears well hydrated and in no acute distress HEENT: atraumatic, conjunctiva  clear, no obvious abnormalities on inspection of external nose and ears NECK: no obvious masses on inspection palpation  LUNGS: clear to auscultation bilaterally, no wheezes, rales or rhonchi, CV: HRno g or m , no clubbing cyanosis or  peripheral edema nl cap refill  MS: moves all extremities without noticeable focal  Abnormality  oa changes noted   Legs no lesion  PSYCH: pleasant and cooperative, no obvious depression or anxiety Lab Results  Component Value Date   WBC 6.8 02/16/2016   HGB 13.4 02/16/2016   HCT 41.9 02/16/2016   PLT 197 02/16/2016   GLUCOSE 98 02/16/2016   CHOL 149 12/04/2015   TRIG 110.0 12/04/2015   HDL 54.40 12/04/2015   LDLCALC 72 12/04/2015   ALT 11* 10/15/2015   AST 15 10/15/2015   NA 140 02/16/2016   K 4.4 02/16/2016   CL 108 02/16/2016   CREATININE 0.64 02/16/2016   BUN 14 02/16/2016   CO2 25 02/16/2016   TSH  0.94  02/15/2016   INR 1.02 01/12/2016   HGBA1C 5.6 10/09/2015    ASSESSMENT AND PLAN:  Discussed the following assessment and plan:  Pain of lower extremity, unspecified laterality eipsodic nocturnal  - curious bilateral lateral  20 - 30 min burnidn acts like crampbut noneuritis character   Rhinorrhea - better on atroven   Wheezy bronchitis - better ok to wean the xymbicort  if needed insurance asks for advair  Chronic diastolic CHF (congestive heart failure), NYHA class 2 ([place]) - to fu with cards   rehab etc  Total visit 74mins > 50% spent counseling and coordinating care as indicated in above note and in instructions to patient .  Overall doing better  But leg issues problematic  Will follow -Patient advised to return or notify health care team  if symptoms worsen ,persist or new concerns arise.  Patient Instructions  Can decrease the symbicort to 1-2 puffs per day  For 1-2 weeks and then off  Call if feel need to go back on  As you insurance wont cover   And wants to use adviar products . Ok to use the nose spray as needed.   Hydration and exercise    Leg pain sounds like a cramp but   Sensation like a nerve.  Episode  To see  If helps .  If  persistent or progressive get back with [place] about this.   ROV in 3-4 months or as needed     [name]. Jaxsin Bottomley M.D.

## 2016-03-26 ENCOUNTER — Encounter (HOSPITAL_COMMUNITY): Payer: Medicare Other

## 2016-03-28 ENCOUNTER — Encounter (HOSPITAL_COMMUNITY): Payer: Medicare Other

## 2016-03-29 ENCOUNTER — Other Ambulatory Visit: Payer: Self-pay | Admitting: Internal Medicine

## 2016-03-31 ENCOUNTER — Encounter (HOSPITAL_COMMUNITY): Payer: Medicare Other

## 2016-03-31 NOTE — Telephone Encounter (Signed)
Sent to the pharmacy by e-scribe. 

## 2016-04-02 ENCOUNTER — Encounter (HOSPITAL_COMMUNITY): Payer: Medicare Other

## 2016-04-04 ENCOUNTER — Encounter (HOSPITAL_COMMUNITY): Payer: Medicare Other

## 2016-04-07 ENCOUNTER — Encounter (HOSPITAL_COMMUNITY): Payer: Medicare Other

## 2016-04-09 ENCOUNTER — Encounter (HOSPITAL_COMMUNITY): Payer: Medicare Other

## 2016-04-11 ENCOUNTER — Encounter (HOSPITAL_COMMUNITY): Payer: Medicare Other

## 2016-04-14 ENCOUNTER — Encounter (HOSPITAL_COMMUNITY): Payer: Medicare Other

## 2016-04-16 ENCOUNTER — Encounter (HOSPITAL_COMMUNITY): Payer: Medicare Other

## 2016-04-18 ENCOUNTER — Ambulatory Visit: Payer: Self-pay

## 2016-04-18 ENCOUNTER — Encounter (HOSPITAL_COMMUNITY): Payer: Medicare Other

## 2016-04-18 ENCOUNTER — Other Ambulatory Visit: Payer: Self-pay

## 2016-04-18 NOTE — Patient Outreach (Signed)
[place] [facility]) Care Management  04/18/2016  Amy Fischer 05-12-1929 [id number]  Telephone call to patient for monthly outreach.  No answer.  HIPAA compliant voice message left.    Plan: RN Health Coach will attempt patient again within 1-2 weeks.    Jone Baseman, RN, MSN [place] [phone]

## 2016-04-18 NOTE — Progress Notes (Signed)
HPI: Follow-up mitral valve repair. Patient was in the hospital in October 2016 with congestive heart failure. Transesophageal echocardiogram showed normal LV function. There was a flail posterior leaflet with severe mitral regurgitation. There was mild to moderate tricuspid regurgitation. Cardiac catheterization October 2016 showed a 30% LAD, 60% first diagonal and 30% right coronary artery. Ejection fraction 60%. Patient subsequent had mitral valve repair. Note preoperative carotid Dopplers showed 1-39% stenosis. Note the patient had atrial fibrillation after surgery. Since last seen, She has dyspnea with extreme activities much improved. No orthopnea, PND, pedal edema, chest pain or syncope. Occasional brief palpitations for 30 seconds.  Current Outpatient Prescriptions  Medication Sig Dispense Refill  . acetaminophen (TYLENOL) 325 MG tablet Take 2 tablets (650 mg total) by mouth every 6 (six) hours as needed for mild pain.    [name] aspirin EC 81 MG EC tablet Take 1 tablet (81 mg total) by mouth daily. (Patient taking differently: Take 81 mg by mouth at bedtime. )    . budesonide-formoterol (SYMBICORT) 160-4.5 MCG/ACT inhaler Inhale 2 puffs into the lungs 2 (two) times daily. Or as directed 1 Inhaler 2  . Cholecalciferol (VITAMIN D PO) Take 1,000 Units by mouth daily.    . CRESTOR 5 MG tablet TAKE ONE-HALF (1/2) TABLET DAILY 90 tablet 1  . DiphenhydrAMINE HCl (BENADRYL PO) Take by mouth.    . docusate sodium (COLACE) 100 MG capsule Take 100 mg by mouth daily as needed for mild constipation.    . fish oil-omega-3 fatty acids 1000 MG capsule Take 1 g by mouth daily.     . folic acid (FOLVITE) 1 MG tablet Take 1 tablet (1 mg total) by mouth daily. 90 tablet 1  . glucosamine-chondroitin 500-400 MG tablet Take 1 tablet by mouth daily.     [name] ipratropium (ATROVENT) 0.03 % nasal spray Place 2 sprays into both nostrils 3 (three) times daily as needed for rhinitis. 30 mL 3  . levothyroxine (SYNTHROID,  LEVOTHROID) 100 MCG tablet take 1 tablet by mouth every morning ON AN EMPTY STOMACH 90 tablet 1  . Melatonin 3 MG TABS Take 3 mg by mouth at bedtime.     . MULTIPLE VITAMIN PO Take 1 tablet by mouth daily. 50+ Senior Vitamin Daily    . omeprazole (PRILOSEC) 40 MG capsule TAKE 1 CAPSULE DAILY (Patient taking differently: TAKE 1 CAPSULE DAILY before dinner) 90 capsule 1  . PACERONE 200 MG tablet Take 200 mg by mouth daily. Reported on 01/15/2016    . Polyethyl Glycol-Propyl Glycol (SYSTANE OP) Place 1 drop into both eyes 2 (two) times daily. Use 1-2 Drops in both eyes    . potassium chloride SA (K-DUR,KLOR-CON) 20 MEQ tablet Take 1 tablet (20 mEq total) by mouth every other day. 45 tablet 1  . PROAIR HFA 108 (90 Base) MCG/ACT inhaler Inhale 2 puffs into the lungs every 6 (six) hours as needed for wheezing or shortness of breath 1 Inhaler 2  . SALINE NASAL SPRAY NA Place 1 spray into both nostrils 3 (three) times daily.     . vitamin E 400 UNIT capsule Take 400 Units by mouth daily.    [name] ZETIA 10 MG tablet TAKE 1 TABLET DAILY 90 tablet 1   No current facility-administered medications for this visit.     Past Medical History  Diagnosis Date  . GERD (gastroesophageal reflux disease)   . Hyperlipidemia   . Hypothyroidism   . Osteoarthritis   . Osteoporosis   .  Episodic recurrent vertigo     MRI Head 2003  . Allergy   . Bladder polyps   . Subclavian steal syndrome     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; CTA 2013 revealed open graft  . Hyperplastic colon polyp   . Esophageal spasm   . History of hepatitis     unknown type  . Asymptomatic carotid artery stenosis     R ICA 40% stenosis on CTA 12/2011.  [name] Anemia   . Cataract     BILATERAL-REMOVED  . Elevated blood pressure 03/25/2011    Bp readings borderline today has hx of elvation  In office and ok at home   Not checke recently   She gfeels was elevated from anxiety    . Diverticulosis   . Intestinal metaplasia of gastric  mucosa   . Hepatic hemangioma   . CHF (congestive heart failure) [facility])     Past Surgical History  Procedure Laterality Date  . Appendectomy    . Cholecystectomy    . Carotid-subclavian bypass graft Left 1999    for [place] steal syndrome  . Rotator cuff repair Left   . Elbow surgery Left   . Cystectomy Left     hand  . Tubal ligation    . Tonsillectomy    . Colonoscopy    . Tear duct probing  07/2014  . Knee surgery Left 2014  . Tee without cardioversion N/A 10/03/2015    Procedure: TRANSESOPHAGEAL ECHOCARDIOGRAM (TEE);  Surgeon: Larey Dresser, MD;  Location: [place];  Service: Cardiovascular;  Laterality: N/A;  . Cardiac catheterization N/A 10/04/2015    Procedure: Right/Left Heart Cath and Coronary Angiography;  Surgeon: Belva Crome, MD;  Location: [place] CV LAB;  Service: Cardiovascular;  Laterality: N/A;  . Mitral valve repair N/A 10/10/2015    Procedure: MITRAL VALVE REPAIR (MVR) WITH SIZE 28 CARPENTIER-EDWARDS PHYSIO II ANNULOPLASTY RING;  Surgeon: [name], MD;  Location: [place];  Service: Open Heart Surgery;  Laterality: N/A;  . Tee without cardioversion N/A 10/10/2015    Procedure: TRANSESOPHAGEAL ECHOCARDIOGRAM (TEE);  Surgeon: [name], MD;  Location: [place];  Service: Open Heart Surgery;  Laterality: N/A;    Social History   Social History  . Marital Status: Widowed    Spouse Name: N/A  . Number of Children: 4  . Years of Education: N/A   Occupational History  . retired    Social History Main Topics  . Smoking status: Former Smoker    Quit date: 01/05/1991  . Smokeless tobacco: Never Used  . Alcohol Use: 2.4 oz/week    4 Glasses of wine per week     Comment: socially  . Drug Use: No  . Sexual Activity: Not on file   Other Topics Concern  . Not on file   Social History Narrative   Widowed   HH of 1    No pets   Former smoker   Exercises regularly    Family History  Problem Relation Age of Onset  . Hypertension  Mother   . Heart disease Father   . Stroke Mother   . Colon cancer Neg Hx     ROS: no fevers or chills, productive cough, hemoptysis, dysphasia, odynophagia, melena, hematochezia, dysuria, hematuria, rash, seizure activity, orthopnea, PND, pedal edema, claudication. Remaining systems are negative.  Physical Exam: Well-developed well-nourished in no acute distress.  Skin is warm and dry.  HEENT is normal.  Neck is supple.  Chest is clear to auscultation with normal expansion.  Cardiovascular exam is regular rate and rhythm.  Abdominal exam nontender or distended. No masses palpated. Extremities show no edema. neuro grossly intact  ECG Sinus tachycardia with first-degree AV block. Normal axis. Nonspecific ST changes.

## 2016-04-21 ENCOUNTER — Encounter (HOSPITAL_COMMUNITY): Payer: Medicare Other

## 2016-04-22 ENCOUNTER — Other Ambulatory Visit (HOSPITAL_COMMUNITY): Payer: Self-pay | Admitting: *Deleted

## 2016-04-22 ENCOUNTER — Encounter: Payer: Self-pay | Admitting: Cardiology

## 2016-04-22 ENCOUNTER — Encounter: Payer: Self-pay | Admitting: *Deleted

## 2016-04-22 ENCOUNTER — Ambulatory Visit (INDEPENDENT_AMBULATORY_CARE_PROVIDER_SITE_OTHER): Payer: Medicare Other | Admitting: Cardiology

## 2016-04-22 DIAGNOSIS — I2510 Atherosclerotic heart disease of native coronary artery without angina pectoris: Secondary | ICD-10-CM | POA: Diagnosis not present

## 2016-04-22 DIAGNOSIS — Z9889 Other specified postprocedural states: Secondary | ICD-10-CM

## 2016-04-22 NOTE — Patient Instructions (Signed)
Medication Instructions:   STOP POTASSIUM  Labwork:  Your physician recommends that you return for lab work in: [place]  Testing/Procedures:  Your physician has requested that you have an echocardiogram. Echocardiography is a painless test that uses sound waves to create images of your heart. It provides your doctor with information about the size and shape of your heart and how well your heart's chambers and valves are working. This procedure takes approximately one hour. There are no restrictions for this procedure.    Follow-Up:  Your physician wants you to follow-up in: [place] will receive a reminder letter in the mail two months in advance. If you don't receive a letter, please call our office to schedule the follow-up appointment.   If you need a refill on your cardiac medications before your next appointment, please call your pharmacy.

## 2016-04-22 NOTE — Assessment & Plan Note (Signed)
Blood pressure controlled. Continue present medications. Note she is now off of her Lasix. We will therefore discontinue potassium. Check potassium and renal function in 1 week.

## 2016-04-22 NOTE — Assessment & Plan Note (Signed)
Patient is status post mitral valve repair.Continue SBE prophylaxis. She may return to cardiac rehabilitation.

## 2016-04-22 NOTE — Assessment & Plan Note (Signed)
Patient did have postoperative atrial fibrillation. She is now off of amiodarone and anticoagulation. She remains in sinus rhythm on electrocardiogram. She has occasional palpitations. These are brief. If they worsen we will consider a monitor as if she was having recurrent atrial fibrillation she would require anticoagulation long-term.

## 2016-04-22 NOTE — Assessment & Plan Note (Signed)
Continue present medications. 

## 2016-04-22 NOTE — Assessment & Plan Note (Signed)
Continue aspirin and statin. 

## 2016-04-23 ENCOUNTER — Encounter (HOSPITAL_COMMUNITY): Payer: Medicare Other

## 2016-04-23 ENCOUNTER — Other Ambulatory Visit: Payer: Self-pay

## 2016-04-23 NOTE — Patient Outreach (Signed)
[place] [facility]) Care Management  [place]  04/23/2016   NOELE ICENHOUR 14-Jun-1929 [id number]  Subjective: Telephone call to patient for monthly outreach.  Patient reports she is doing good.  She reports seeing the cardiologist yesterday and visit went well.  Patient looking forward to going back to cardiac rehab.  Patient reports however she has remained active since not going.  Patient reports some recent weight gain from losing weight from hospitalization. Patient denies shortness of breath or swelling.  Discussed with patient heart signs and symptoms and when to notify physician.  She verbalized understanding.  No concerns.    Objective:   Encounter Medications:  Outpatient Encounter Prescriptions as of 04/23/2016  Medication Sig  . acetaminophen (TYLENOL) 325 MG tablet Take 2 tablets (650 mg total) by mouth every 6 (six) hours as needed for mild pain.  [name] aspirin EC 81 MG EC tablet Take 1 tablet (81 mg total) by mouth daily. (Patient taking differently: Take 81 mg by mouth at bedtime. )  . budesonide-formoterol (SYMBICORT) 160-4.5 MCG/ACT inhaler Inhale 2 puffs into the lungs 2 (two) times daily. Or as directed  . Cholecalciferol (VITAMIN D PO) Take 1,000 Units by mouth daily.  . CRESTOR 5 MG tablet TAKE ONE-HALF (1/2) TABLET DAILY  . DiphenhydrAMINE HCl (BENADRYL PO) Take by mouth.  . docusate sodium (COLACE) 100 MG capsule Take 100 mg by mouth daily as needed for mild constipation.  . fish oil-omega-3 fatty acids 1000 MG capsule Take 1 g by mouth daily.   . folic acid (FOLVITE) 1 MG tablet Take 1 tablet (1 mg total) by mouth daily.  [name] glucosamine-chondroitin 500-400 MG tablet Take 1 tablet by mouth daily.   [name] ipratropium (ATROVENT) 0.03 % nasal spray Place 2 sprays into both nostrils 3 (three) times daily as needed for rhinitis.  [name] levothyroxine (SYNTHROID, LEVOTHROID) 100 MCG tablet take 1 tablet by mouth every morning ON AN EMPTY STOMACH  . Melatonin 3 MG TABS  Take 3 mg by mouth at bedtime.   . MULTIPLE VITAMIN PO Take 1 tablet by mouth daily. 50+ Senior Vitamin Daily  . omeprazole (PRILOSEC) 40 MG capsule TAKE 1 CAPSULE DAILY (Patient taking differently: TAKE 1 CAPSULE DAILY before dinner)  . Polyethyl Glycol-Propyl Glycol (SYSTANE OP) Place 1 drop into both eyes 2 (two) times daily. Use 1-2 Drops in both eyes  . PROAIR HFA 108 (90 Base) MCG/ACT inhaler Inhale 2 puffs into the lungs every 6 (six) hours as needed for wheezing or shortness of breath  . SALINE NASAL SPRAY NA Place 1 spray into both nostrils 3 (three) times daily.   . vitamin E 400 UNIT capsule Take 400 Units by mouth daily.  [name] ZETIA 10 MG tablet TAKE 1 TABLET DAILY   No facility-administered encounter medications on file as of 04/23/2016.    Functional Status:  In your present state of health, do you have any difficulty performing the following activities: 01/31/2016 11/07/2015  Hearing? N N  Vision? N N  Difficulty concentrating or making decisions? N Y  Walking or climbing stairs? Y Y  Dressing or bathing? N N  Doing errands, shopping? [name]  Preparing Food and eating ? N N  Using the Toilet? N N  In the past six months, have you accidently leaked urine? N N  Do you have problems with loss of bowel control? N N  Managing your Medications? N Y  Managing your Finances? N N  Housekeeping or managing your Housekeeping?  [name]    Fall/Depression Screening: PHQ 2/9 Scores 04/23/2016 03/21/2016 02/21/2016 01/31/2016 10/29/2015 07/26/2014  PHQ - 2 Score 0 0 0 0 5 2  PHQ- 9 Score - - - - 14 -    Assessment: Patient continues to benefit from health coach outreach for disease management and support.    Plan:  [facility] CM Care Plan Problem One        Most Recent Value   Care Plan Problem One  Heart Failure knowledge deficit   Role Documenting the Problem One  Health Coach   Care Plan for Problem One  Active   THN Long Term Goal (31-90 days)  Patient will verbalize signs and symptoms of heart  failure within 90 days.   THN Long Term Goal Start Date  01/31/16   Interventions for Problem One Long Term Goal  RN Health Coach reviewed signs and symptoms of heart failure with patient.   THN CM Short Term Goal #1 (0-30 days)  Patient will verbalize yellow zone of heart failure chart within 30 days   THN CM Short Term Goal #1 Start Date  04/23/16 [goal continued]   Interventions for Short Term Goal #1  RN Health Coach reviewed yellow zone of heart failure chart with patient.   THN CM Short Term Goal #2 (0-30 days)  Patient will be able to verbalize 3 foods high in sodium within 30 days   THN CM Short Term Goal #2 Start Date  03/21/16 [goal met]   [facility] CM Short Term Goal #2 Met Date  04/23/16   Interventions for Short Term Goal #2  Patient able to name 3 food high in salt.       RN Health Coach will contact patient within one month and patient agrees to next outreach.    Jone Baseman, RN, MSN [place] [phone]

## 2016-04-25 ENCOUNTER — Encounter (HOSPITAL_COMMUNITY): Payer: Medicare Other

## 2016-04-28 ENCOUNTER — Other Ambulatory Visit: Payer: Self-pay | Admitting: Family Medicine

## 2016-04-28 ENCOUNTER — Encounter (HOSPITAL_COMMUNITY): Payer: Medicare Other

## 2016-04-29 DIAGNOSIS — I2510 Atherosclerotic heart disease of native coronary artery without angina pectoris: Secondary | ICD-10-CM | POA: Diagnosis not present

## 2016-04-29 MED ORDER — IPRATROPIUM BROMIDE 0.03 % NA SOLN: 2.0000 | Freq: Three times a day (TID) | NASAL | Status: DC | PRN

## 2016-04-30 ENCOUNTER — Encounter (HOSPITAL_COMMUNITY): Payer: Medicare Other

## 2016-04-30 LAB — BASIC METABOLIC PANEL
BUN: 16 mg/dL (ref 7–25)
CALCIUM: 9.5 mg/dL (ref 8.6–10.4)
CO2: 25 mmol/L (ref 20–31)
CREATININE: 0.74 mg/dL (ref 0.60–0.88)
Chloride: 105 mmol/L (ref 98–110)
Glucose, Bld: 101 mg/dL — ABNORMAL HIGH (ref 65–99)
Potassium: 4.3 mmol/L (ref 3.5–5.3)
SODIUM: 140 mmol/L (ref 135–146)

## 2016-05-02 ENCOUNTER — Encounter (HOSPITAL_COMMUNITY): Payer: Medicare Other

## 2016-05-06 ENCOUNTER — Ambulatory Visit (HOSPITAL_COMMUNITY): Payer: Medicare Other | Attending: Cardiology

## 2016-05-06 ENCOUNTER — Other Ambulatory Visit: Payer: Self-pay

## 2016-05-06 DIAGNOSIS — Z87891 Personal history of nicotine dependence: Secondary | ICD-10-CM | POA: Insufficient documentation

## 2016-05-06 DIAGNOSIS — Z8249 Family history of ischemic heart disease and other diseases of the circulatory system: Secondary | ICD-10-CM | POA: Insufficient documentation

## 2016-05-06 DIAGNOSIS — I340 Nonrheumatic mitral (valve) insufficiency: Secondary | ICD-10-CM | POA: Insufficient documentation

## 2016-05-06 DIAGNOSIS — E785 Hyperlipidemia, unspecified: Secondary | ICD-10-CM | POA: Diagnosis not present

## 2016-05-06 DIAGNOSIS — I509 Heart failure, unspecified: Secondary | ICD-10-CM | POA: Insufficient documentation

## 2016-05-06 DIAGNOSIS — I2510 Atherosclerotic heart disease of native coronary artery without angina pectoris: Secondary | ICD-10-CM | POA: Insufficient documentation

## 2016-05-06 DIAGNOSIS — I110 Hypertensive heart disease with heart failure: Secondary | ICD-10-CM | POA: Insufficient documentation

## 2016-05-06 DIAGNOSIS — I071 Rheumatic tricuspid insufficiency: Secondary | ICD-10-CM | POA: Diagnosis not present

## 2016-05-06 DIAGNOSIS — I059 Rheumatic mitral valve disease, unspecified: Secondary | ICD-10-CM | POA: Diagnosis present

## 2016-05-14 ENCOUNTER — Encounter (HOSPITAL_COMMUNITY): Payer: Self-pay | Admitting: *Deleted

## 2016-05-14 ENCOUNTER — Emergency Department (HOSPITAL_COMMUNITY)
Admission: EM | Admit: 2016-05-14 | Discharge: 2016-05-14 | Disposition: A | Discharge status: Home / Self Care | Payer: Medicare Other | Attending: Emergency Medicine | Admitting: Emergency Medicine

## 2016-05-14 ENCOUNTER — Emergency Department (HOSPITAL_COMMUNITY): Payer: Medicare Other

## 2016-05-14 DIAGNOSIS — S0990XA Unspecified injury of head, initial encounter: Secondary | ICD-10-CM | POA: Diagnosis not present

## 2016-05-14 DIAGNOSIS — S29001A Unspecified injury of muscle and tendon of front wall of thorax, initial encounter: Secondary | ICD-10-CM | POA: Diagnosis not present

## 2016-05-14 DIAGNOSIS — Z87891 Personal history of nicotine dependence: Secondary | ICD-10-CM | POA: Insufficient documentation

## 2016-05-14 DIAGNOSIS — M79645 Pain in left finger(s): Secondary | ICD-10-CM | POA: Diagnosis not present

## 2016-05-14 DIAGNOSIS — W19XXXA Unspecified fall, initial encounter: Secondary | ICD-10-CM

## 2016-05-14 DIAGNOSIS — Z9889 Other specified postprocedural states: Secondary | ICD-10-CM | POA: Diagnosis not present

## 2016-05-14 DIAGNOSIS — Y998 Other external cause status: Secondary | ICD-10-CM | POA: Diagnosis not present

## 2016-05-14 DIAGNOSIS — I509 Heart failure, unspecified: Secondary | ICD-10-CM | POA: Diagnosis not present

## 2016-05-14 DIAGNOSIS — S29002A Unspecified injury of muscle and tendon of back wall of thorax, initial encounter: Secondary | ICD-10-CM | POA: Diagnosis not present

## 2016-05-14 DIAGNOSIS — Z86018 Personal history of other benign neoplasm: Secondary | ICD-10-CM | POA: Insufficient documentation

## 2016-05-14 DIAGNOSIS — M542 Cervicalgia: Secondary | ICD-10-CM

## 2016-05-14 DIAGNOSIS — Y9301 Activity, walking, marching and hiking: Secondary | ICD-10-CM | POA: Diagnosis not present

## 2016-05-14 DIAGNOSIS — S299XXA Unspecified injury of thorax, initial encounter: Secondary | ICD-10-CM | POA: Diagnosis not present

## 2016-05-14 DIAGNOSIS — S60012A Contusion of left thumb without damage to nail, initial encounter: Secondary | ICD-10-CM | POA: Diagnosis not present

## 2016-05-14 DIAGNOSIS — Z8619 Personal history of other infectious and parasitic diseases: Secondary | ICD-10-CM | POA: Diagnosis not present

## 2016-05-14 DIAGNOSIS — S199XXA Unspecified injury of neck, initial encounter: Secondary | ICD-10-CM | POA: Insufficient documentation

## 2016-05-14 DIAGNOSIS — Y92511 Restaurant or cafe as the place of occurrence of the external cause: Secondary | ICD-10-CM | POA: Diagnosis not present

## 2016-05-14 DIAGNOSIS — K219 Gastro-esophageal reflux disease without esophagitis: Secondary | ICD-10-CM | POA: Insufficient documentation

## 2016-05-14 DIAGNOSIS — Z8679 Personal history of other diseases of the circulatory system: Secondary | ICD-10-CM | POA: Diagnosis not present

## 2016-05-14 DIAGNOSIS — Z86010 Personal history of colonic polyps: Secondary | ICD-10-CM | POA: Diagnosis not present

## 2016-05-14 DIAGNOSIS — M810 Age-related osteoporosis without current pathological fracture: Secondary | ICD-10-CM | POA: Insufficient documentation

## 2016-05-14 DIAGNOSIS — Z79899 Other long term (current) drug therapy: Secondary | ICD-10-CM | POA: Diagnosis not present

## 2016-05-14 DIAGNOSIS — Z8669 Personal history of other diseases of the nervous system and sense organs: Secondary | ICD-10-CM | POA: Diagnosis not present

## 2016-05-14 DIAGNOSIS — S0003XA Contusion of scalp, initial encounter: Secondary | ICD-10-CM

## 2016-05-14 DIAGNOSIS — E039 Hypothyroidism, unspecified: Secondary | ICD-10-CM | POA: Diagnosis not present

## 2016-05-14 DIAGNOSIS — D649 Anemia, unspecified: Secondary | ICD-10-CM | POA: Insufficient documentation

## 2016-05-14 DIAGNOSIS — S098XXA Other specified injuries of head, initial encounter: Secondary | ICD-10-CM | POA: Diagnosis not present

## 2016-05-14 DIAGNOSIS — M1990 Unspecified osteoarthritis, unspecified site: Secondary | ICD-10-CM | POA: Insufficient documentation

## 2016-05-14 DIAGNOSIS — W010XXA Fall on same level from slipping, tripping and stumbling without subsequent striking against object, initial encounter: Secondary | ICD-10-CM | POA: Insufficient documentation

## 2016-05-14 DIAGNOSIS — S064X0A Epidural hemorrhage without loss of consciousness, initial encounter: Secondary | ICD-10-CM | POA: Diagnosis not present

## 2016-05-14 DIAGNOSIS — S6992XA Unspecified injury of left wrist, hand and finger(s), initial encounter: Secondary | ICD-10-CM | POA: Diagnosis not present

## 2016-05-14 DIAGNOSIS — M546 Pain in thoracic spine: Secondary | ICD-10-CM

## 2016-05-14 DIAGNOSIS — R0781 Pleurodynia: Secondary | ICD-10-CM | POA: Diagnosis not present

## 2016-05-14 DIAGNOSIS — Z7982 Long term (current) use of aspirin: Secondary | ICD-10-CM | POA: Diagnosis not present

## 2016-05-14 MED ORDER — TRAMADOL HCL 50 MG PO TABS
50.0000 mg | ORAL_TABLET | Freq: Once | ORAL | Status: AC
  Administered 2016-05-14: 50 mg via ORAL
  Filled 2016-05-14: qty 1

## 2016-05-14 MED ORDER — TRAMADOL HCL 50 MG PO TABS: 50.0000 mg | ORAL_TABLET | Freq: Four times a day (QID) | ORAL | Status: DC | PRN

## 2016-05-14 NOTE — ED Notes (Signed)
Pt still in imaging

## 2016-05-14 NOTE — ED Notes (Signed)
Pt is in stable condition upon d/c and is escorted from ED via wheelchair. 

## 2016-05-14 NOTE — ED Notes (Signed)
Pt arrives from [facility] via [place]. Pt was leaving and pulled the door (instead of pushed) and fell backwards onto a tile floor. Pt was taking eliqius and stopped about 1 month ago. Pt has a bruise on her left thumb and limited range of motion. Pt has c/o left lateral rib pain and has a hematoma and a small puncture wound to the back of the head, with little blood.

## 2016-05-19 ENCOUNTER — Telehealth (HOSPITAL_COMMUNITY): Payer: Self-pay | Admitting: *Deleted

## 2016-05-19 ENCOUNTER — Other Ambulatory Visit: Payer: Self-pay

## 2016-05-19 ENCOUNTER — Ambulatory Visit: Payer: Self-pay

## 2016-05-19 NOTE — Telephone Encounter (Signed)
Returned call from message left.  Pt suffered a fall last week.  Pt went to the ED to be assessed.  Because of pt pain to her ribs and generalized soreness. Pt desires to reschedule her orientation.  Pt given a rescheduled appointment for June 22.  Cherre Huger, BSN

## 2016-05-19 NOTE — Patient Outreach (Signed)
[place] [facility]) Care Management  05/19/2016  Amy Fischer November 12, 1929 [id number]   Telephone call to patient for monthly call.  No answer.  Unable to leave a message.    Plan: RN Health Coach will attempt patient within 1-2 weeks.    Jone Baseman, RN, MSN [place] [phone]

## 2016-05-21 NOTE — ED Provider Notes (Signed)
CSN: [id number]     Arrival date & time 05/14/16  1501 History   First MD Initiated Contact with Patient 05/14/16 1521     Chief Complaint  Patient presents with  . Fall     HPI Patient was walking at a restaurant this morning when she pulled on the handle and slipped and fell backwards.  She was previously on anticoagulants but is no longer taking anticoagulants.  She presents with minor head injury as well as tenderness and bruising of her left thumb and some left lateral rib pain.  No active bleeding at this time.  No loss consciousness.  Patient denies neck pain.  She denies weakness of her arms or legs.  She denies chest pain or shortness of breath.  No abdominal pain.  Pain is mild in severity.   Past Medical History  Diagnosis Date  . GERD (gastroesophageal reflux disease)   . Hyperlipidemia   . Hypothyroidism   . Osteoarthritis   . Osteoporosis   . Episodic recurrent vertigo     MRI Head 2003  . Allergy   . Bladder polyps   . Subclavian steal syndrome     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; CTA 2013 revealed open graft  . Hyperplastic colon polyp   . Esophageal spasm   . History of hepatitis     unknown type  . Asymptomatic carotid artery stenosis     R ICA 40% stenosis on CTA 12/2011.  [name] Anemia   . Cataract     BILATERAL-REMOVED  . Elevated blood pressure 03/25/2011    Bp readings borderline today has hx of elvation  In office and ok at home   Not checke recently   She gfeels was elevated from anxiety    . Diverticulosis   . Intestinal metaplasia of gastric mucosa   . Hepatic hemangioma   . CHF (congestive heart failure) [facility])    Past Surgical History  Procedure Laterality Date  . Appendectomy    . Cholecystectomy    . Carotid-subclavian bypass graft Left 1999    for [place] steal syndrome  . Rotator cuff repair Left   . Elbow surgery Left   . Cystectomy Left     hand  . Tubal ligation    . Tonsillectomy    . Colonoscopy    . Tear duct probing   07/2014  . Knee surgery Left 2014  . Tee without cardioversion N/A 10/03/2015    Procedure: TRANSESOPHAGEAL ECHOCARDIOGRAM (TEE);  Surgeon: Larey Dresser, MD;  Location: [place];  Service: Cardiovascular;  Laterality: N/A;  . Cardiac catheterization N/A 10/04/2015    Procedure: Right/Left Heart Cath and Coronary Angiography;  Surgeon: Belva Crome, MD;  Location: [place] [facility];  Service: Cardiovascular;  Laterality: N/A;  . Mitral valve repair N/A 10/10/2015    Procedure: MITRAL VALVE REPAIR (MVR) WITH SIZE 28 CARPENTIER-EDWARDS PHYSIO II ANNULOPLASTY RING;  Surgeon: [name], MD;  Location: [place];  Service: Open Heart Surgery;  Laterality: N/A;  . Tee without cardioversion N/A 10/10/2015    Procedure: TRANSESOPHAGEAL ECHOCARDIOGRAM (TEE);  Surgeon: [name], MD;  Location: [place];  Service: Open Heart Surgery;  Laterality: N/A;   Family History  Problem Relation Age of Onset  . Hypertension Mother   . Heart disease Father   . Stroke Mother   . Colon cancer Neg Hx    Social History  Substance Use Topics  . Smoking status: Former Smoker  Quit date: 01/05/1991  . Smokeless tobacco: Never Used  . Alcohol Use: 2.4 oz/week    4 Glasses of wine per week     Comment: socially   OB History    No data available     Review of Systems  All other systems reviewed and are negative.     Allergies  Codeine; Risedronate sodium; Statins; Tape; and Augmentin  Home Medications   Prior to Admission medications   Medication Sig Start Date End Date Taking? Authorizing Provider  aspirin EC 81 MG EC tablet Take 1 tablet (81 mg total) by mouth daily. Patient taking differently: Take 81 mg by mouth at bedtime.  10/22/15  Yes Wayne E Gold, PA-C  Cholecalciferol (VITAMIN D PO) Take 1,000 Units by mouth daily.   Yes Historical Provider, MD  CRESTOR 5 MG tablet TAKE ONE-HALF (1/2) TABLET DAILY 02/07/16  Yes Burnis Medin, MD  fish oil-omega-3 fatty acids 1000 MG  capsule Take 1 g by mouth daily.    Yes Historical Provider, MD  folic acid (FOLVITE) 1 MG tablet Take 1 tablet (1 mg total) by mouth daily. 01/15/16  Yes Burnis Medin, MD  glucosamine-chondroitin 500-400 MG tablet Take 1 tablet by mouth daily.    Yes Historical Provider, MD  ipratropium (ATROVENT) 0.03 % nasal spray Place 2 sprays into both nostrils 3 (three) times daily as needed for rhinitis. 04/29/16  Yes Burnis Medin, MD  levothyroxine (SYNTHROID, LEVOTHROID) 100 MCG tablet take 1 tablet by mouth every morning ON AN EMPTY STOMACH 01/15/16  Yes Burnis Medin, MD  Melatonin 3 MG TABS Take 3 mg by mouth at bedtime.    Yes Historical Provider, MD  MULTIPLE VITAMIN PO Take 1 tablet by mouth daily. 50+ Senior Vitamin Daily   Yes Historical Provider, MD  omeprazole (PRILOSEC) 40 MG capsule TAKE 1 CAPSULE DAILY Patient taking differently: TAKE 1 CAPSULE DAILY before dinner 02/08/15  Yes Burnis Medin, MD  Polyethyl Glycol-Propyl Glycol (SYSTANE OP) Place 1 drop into both eyes 2 (two) times daily. Use 1-2 Drops in both eyes   Yes Historical Provider, MD  SALINE NASAL SPRAY NA Place 1 spray into both nostrils 2 (two) times daily as needed (congestion).    Yes Historical Provider, MD  vitamin E 400 UNIT capsule Take 400 Units by mouth daily.   Yes Historical Provider, MD  ZETIA 10 MG tablet TAKE 1 TABLET DAILY 03/31/16  Yes Burnis Medin, MD  acetaminophen (TYLENOL) 325 MG tablet Take 2 tablets (650 mg total) by mouth every 6 (six) hours as needed for mild pain. 10/22/15   Wayne E Gold, PA-C  budesonide-formoterol (SYMBICORT) 160-4.5 MCG/ACT inhaler Inhale 2 puffs into the lungs 2 (two) times daily. Or as directed Patient taking differently: Inhale 2 puffs into the lungs 2 (two) times daily as needed. Or as directed 02/26/16   Burnis Medin, MD  DiphenhydrAMINE HCl (BENADRYL PO) Take by mouth.    Historical Provider, MD  docusate sodium (COLACE) 100 MG capsule Take 100 mg by mouth daily as needed for mild  constipation.    Historical Provider, MD  PROAIR HFA 108 [phone] Base) MCG/ACT inhaler Inhale 2 puffs into the lungs every 6 (six) hours as needed for wheezing or shortness of breath 02/27/16   Burnis Medin, MD  traMADol (ULTRAM) 50 MG tablet Take 1 tablet (50 mg total) by mouth every 6 (six) hours as needed. 05/14/16   Jola Schmidt, MD   BP [phone]  mmHg  Pulse 90  SpO2 96% Physical Exam  Constitutional: She is oriented to person, place, and time. She appears well-developed and well-nourished. No distress.  HENT:  Head: Normocephalic and atraumatic.  Eyes: EOM are normal.  Neck: Neck supple.  Mild cervical and paracervical tenderness.  Cardiovascular: Normal rate, regular rhythm and normal heart sounds.   Pulmonary/Chest: Effort normal and breath sounds normal.  Mild tenderness left lateral chest wall without bruising or crepitus.  Abdominal: Soft. She exhibits no distension. There is no tenderness.  Musculoskeletal: Normal range of motion.  No weakness of upper or lower major muscle groups bilaterally.  Mild thoracic and parathoracic tenderness.  Mild tenderness of the base of her left thumb with a small bruise.  Full range of motion noted however of the left thumb  Neurological: She is alert and oriented to person, place, and time.  Skin: Skin is warm and dry.  Psychiatric: She has a normal mood and affect. Judgment normal.  Nursing note and vitals reviewed.   ED Course  Procedures (including critical care time) Labs Review Labs Reviewed - No data to display   Thoracic film  Cervical spine film  Left hand film  Ribs unilateral with chest   I have personally reviewed and evaluated these images and lab results as part of my medical decision-making.   EKG Interpretation None      MDM   Final diagnoses:  Fall, initial encounter  Left-sided thoracic back pain  Neck pain  Scalp hematoma, initial encounter  Minor head injury, initial encounter    The patient is overall  well-appearing.  Plain films are normal.  Feels better after several doses of tramadol.  Home with tramadol.  Primary care follow-up.    Jola Schmidt, MD 05/21/16 1009

## 2016-05-22 ENCOUNTER — Other Ambulatory Visit: Payer: Self-pay

## 2016-05-22 ENCOUNTER — Inpatient Hospital Stay (HOSPITAL_COMMUNITY): Admission: RE | Admit: 2016-05-22 | Payer: Medicare Other | Source: Ambulatory Visit

## 2016-05-22 NOTE — Patient Outreach (Signed)
[facility]) Care Management  [place]  05/22/2016   Amy Fischer 11-21-1929 [id number]  Subjective: Telephone call to patient for monthly call. Patient reports she was doing ok until recent fall on 05-14-16. Patient reports she was seen in the emergency room as she had some injuries.  She reports she is taking tramadol for left rib pain. Patient reports pain is only when she moves and takes big deep breaths. Discussed with patient fall precautions. Patient reports now she is afraid of falling and is using her walker for ambulation.  Patient denies swelling or shortness of breath. She reports that her weight has remained stable. Discussed with patient heart yellow zone and when to notify physician. She verbalized understanding.    Objective:   Encounter Medications:  Outpatient Encounter Prescriptions as of 05/22/2016  Medication Sig  . acetaminophen (TYLENOL) 325 MG tablet Take 2 tablets (650 mg total) by mouth every 6 (six) hours as needed for mild pain.  [name] aspirin EC 81 MG EC tablet Take 1 tablet (81 mg total) by mouth daily. (Patient taking differently: Take 81 mg by mouth at bedtime. )  . budesonide-formoterol (SYMBICORT) 160-4.5 MCG/ACT inhaler Inhale 2 puffs into the lungs 2 (two) times daily. Or as directed (Patient taking differently: Inhale 2 puffs into the lungs 2 (two) times daily as needed. Or as directed)  . Cholecalciferol (VITAMIN D PO) Take 1,000 Units by mouth daily.  . fish oil-omega-3 fatty acids 1000 MG capsule Take 1 g by mouth daily.   . folic acid (FOLVITE) 1 MG tablet Take 1 tablet (1 mg total) by mouth daily.  [name] glucosamine-chondroitin 500-400 MG tablet Take 1 tablet by mouth daily.   [name] ipratropium (ATROVENT) 0.03 % nasal spray Place 2 sprays into both nostrils 3 (three) times daily as needed for rhinitis.  [name] levothyroxine (SYNTHROID, LEVOTHROID) 100 MCG tablet take 1 tablet by mouth every morning ON AN EMPTY STOMACH  . Melatonin 3 MG TABS  Take 3 mg by mouth at bedtime.   . MULTIPLE VITAMIN PO Take 1 tablet by mouth daily. 50+ Senior Vitamin Daily  . omeprazole (PRILOSEC) 40 MG capsule TAKE 1 CAPSULE DAILY (Patient taking differently: TAKE 1 CAPSULE DAILY before dinner)  . Polyethyl Glycol-Propyl Glycol (SYSTANE OP) Place 1 drop into both eyes 2 (two) times daily. Use 1-2 Drops in both eyes  . PROAIR HFA 108 (90 Base) MCG/ACT inhaler Inhale 2 puffs into the lungs every 6 (six) hours as needed for wheezing or shortness of breath  . SALINE NASAL SPRAY NA Place 1 spray into both nostrils 2 (two) times daily as needed (congestion).   . traMADol (ULTRAM) 50 MG tablet Take 1 tablet (50 mg total) by mouth every 6 (six) hours as needed.  . vitamin E 400 UNIT capsule Take 400 Units by mouth daily.  [name] ZETIA 10 MG tablet TAKE 1 TABLET DAILY  . CRESTOR 5 MG tablet TAKE ONE-HALF (1/2) TABLET DAILY  . DiphenhydrAMINE HCl (BENADRYL PO) Take by mouth.  . docusate sodium (COLACE) 100 MG capsule Take 100 mg by mouth daily as needed for mild constipation.   No facility-administered encounter medications on file as of 05/22/2016.    Functional Status:  In your present state of health, do you have any difficulty performing the following activities: 01/31/2016  Hearing? N  Vision? N  Difficulty concentrating or making decisions? N  Walking or climbing stairs? Y  Dressing or bathing? N  Doing errands, shopping? Amy Fischer  Preparing Food and eating ? N  Using the Toilet? N  In the past six months, have you accidently leaked urine? N  Do you have problems with loss of bowel control? N  Managing your Medications? N  Managing your Finances? N  Housekeeping or managing your Housekeeping? Y    Fall/Depression Screening: PHQ 2/9 Scores 05/22/2016 04/23/2016 03/21/2016 02/21/2016 01/31/2016 10/29/2015 07/26/2014  PHQ - 2 Score 0 0 0 0 0 5 2  PHQ- 9 Score - - - - - 14 -    Assessment: Patient continues to benefit from health coach outreach for disease management  and support.    Plan:  [facility] CM Care Plan Problem One        Most Recent Value   Care Plan Problem One  Heart Failure knowledge deficit   Role Documenting the Problem One  Health Coach   Care Plan for Problem One  Active   THN Long Term Goal (31-90 days)  Patient will verbalize signs and symptoms of heart failure within 90 days.   THN Long Term Goal Start Date  05/22/16 [goal continued]   Interventions for Problem One Long Term Goal  RN Health Coach reiterated signs and symptoms of heart failure with patient.   THN CM Short Term Goal #1 (0-30 days)  Patient will verbalize yellow zone of heart failure chart within 30 days   THN CM Short Term Goal #1 Start Date  05/22/16 [goal continued]   Interventions for Short Term Goal #1  RN Health Coach reiterated yellow zone of heart failure chart with patient.     RN Health Coach will contact patient within one month and patient agrees to next outreach.  Jone Baseman, RN, MSN [place] [phone]

## 2016-05-28 ENCOUNTER — Ambulatory Visit (HOSPITAL_COMMUNITY): Payer: Medicare Other

## 2016-05-30 ENCOUNTER — Ambulatory Visit (HOSPITAL_COMMUNITY): Payer: Medicare Other

## 2016-06-02 ENCOUNTER — Ambulatory Visit (HOSPITAL_COMMUNITY): Payer: Medicare Other

## 2016-06-04 ENCOUNTER — Ambulatory Visit (HOSPITAL_COMMUNITY): Payer: Medicare Other

## 2016-06-06 ENCOUNTER — Ambulatory Visit (HOSPITAL_COMMUNITY): Payer: Medicare Other

## 2016-06-09 ENCOUNTER — Ambulatory Visit (HOSPITAL_COMMUNITY): Payer: Medicare Other

## 2016-06-09 NOTE — Progress Notes (Signed)
Pre visit review using our clinic review tool, if applicable. No additional management support is needed unless otherwise documented below in the visit note.  Chief Complaint  Patient presents with  . Follow-up    HPI: Amy Fischer 82 y.o.  Comes in for  Chronic disease management Fall  In ed 5 17   Ribs    No fx noted.  Pain much better   Was at [employer]. Pulled dooor instead of pushing and lost balance fell on back of head and hurt ribs but NO FRACTURE  On prolia .  Now more comfortable    Redoing  Rehab. Again.  No major injury  Otherwise  dsicouraged because of loosing balance  And  getting "paranoid" about falling  Doesn't want to be dependent on a walker. She does have some neuropathy in her feet her eyes water but she is active and still plays bridge. Thus was at the restaurant when she fell. In CHF clinic  Taking her thyroid medicine on a regular basis rare missing. No chest pain shortness breath at this time. ROS: See pertinent positives and negatives per HPI.  Past Medical History  Diagnosis Date  . GERD (gastroesophageal reflux disease)   . Hyperlipidemia   . Hypothyroidism   . Osteoarthritis   . Osteoporosis   . Episodic recurrent vertigo     MRI Head 2003  . Allergy   . Bladder polyps   . Subclavian steal syndrome     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; CTA 2013 revealed open graft  . Hyperplastic colon polyp   . Esophageal spasm   . History of hepatitis     unknown type  . Asymptomatic carotid artery stenosis     R ICA 40% stenosis on CTA 12/2011.  [name] Anemia   . Cataract     BILATERAL-REMOVED  . Elevated blood pressure 03/25/2011    Bp readings borderline today has hx of elvation  In office and ok at home   Not checke recently   She gfeels was elevated from anxiety    . Diverticulosis   . Intestinal metaplasia of gastric mucosa   . Hepatic hemangioma   . CHF (congestive heart failure) (HCC)     Family History  Problem Relation Age  of Onset  . Hypertension Mother   . Heart disease Father   . Stroke Mother   . Colon cancer Neg Hx     Social History   Social History  . Marital Status: Widowed    Spouse Name: N/A  . Number of Children: 4  . Years of Education: N/A   Occupational History  . retired    Social History Main Topics  . Smoking status: Former Smoker    Quit date: 01/05/1991  . Smokeless tobacco: Never Used  . Alcohol Use: 2.4 oz/week    4 Glasses of wine per week     Comment: socially  . Drug Use: No  . Sexual Activity: Not Asked   Other Topics Concern  . None   Social History Narrative   Widowed   HH of 1    No pets   Former smoker   Exercises regularly    Outpatient Prescriptions Prior to Visit  Medication Sig Dispense Refill  . acetaminophen (TYLENOL) 325 MG tablet Take 2 tablets (650 mg total) by mouth every 6 (six) hours as needed for mild pain.    [name] aspirin EC 81 MG EC tablet Take 1 tablet (81  mg total) by mouth daily. (Patient taking differently: Take 81 mg by mouth at bedtime. )    . budesonide-formoterol (SYMBICORT) 160-4.5 MCG/ACT inhaler Inhale 2 puffs into the lungs 2 (two) times daily. Or as directed (Patient taking differently: Inhale 2 puffs into the lungs 2 (two) times daily as needed. Or as directed) 1 Inhaler 2  . CRESTOR 5 MG tablet TAKE ONE-HALF (1/2) TABLET DAILY 90 tablet 1  . DiphenhydrAMINE HCl (BENADRYL PO) Take by mouth.    . docusate sodium (COLACE) 100 MG capsule Take 100 mg by mouth daily as needed for mild constipation.    . fish oil-omega-3 fatty acids 1000 MG capsule Take 1 g by mouth daily.     . folic acid (FOLVITE) 1 MG tablet Take 1 tablet (1 mg total) by mouth daily. 90 tablet 1  . glucosamine-chondroitin 500-400 MG tablet Take 1 tablet by mouth daily.     [name] ipratropium (ATROVENT) 0.03 % nasal spray Place 2 sprays into both nostrils 3 (three) times daily as needed for rhinitis. 30 mL 3  . levothyroxine (SYNTHROID, LEVOTHROID) 100 MCG tablet take 1  tablet by mouth every morning ON AN EMPTY STOMACH 90 tablet 1  . Melatonin 3 MG TABS Take 3 mg by mouth at bedtime.     . MULTIPLE VITAMIN PO Take 1 tablet by mouth daily. 50+ Senior Vitamin Daily    . omeprazole (PRILOSEC) 40 MG capsule TAKE 1 CAPSULE DAILY (Patient taking differently: TAKE 1 CAPSULE DAILY before dinner) 90 capsule 1  . Polyethyl Glycol-Propyl Glycol (SYSTANE OP) Place 1 drop into both eyes 2 (two) times daily. Use 1-2 Drops in both eyes    . PROAIR HFA 108 (90 Base) MCG/ACT inhaler Inhale 2 puffs into the lungs every 6 (six) hours as needed for wheezing or shortness of breath 1 Inhaler 2  . SALINE NASAL SPRAY NA Place 1 spray into both nostrils 2 (two) times daily as needed (congestion).     . traMADol (ULTRAM) 50 MG tablet Take 1 tablet (50 mg total) by mouth every 6 (six) hours as needed. 15 tablet 0  . vitamin E 400 UNIT capsule Take 400 Units by mouth daily.    [name] ZETIA 10 MG tablet TAKE 1 TABLET DAILY 90 tablet 1  . Cholecalciferol (VITAMIN D PO) Take 1,000 Units by mouth daily.     No facility-administered medications prior to visit.     EXAM:  BP 124/60 mmHg  Temp(Src) 97.6 F (36.4 C) (Oral)  Ht 5\' 2"  (1.575 m)  Wt 119 lb 1.6 oz (54.023 kg)  BMI 21.78 kg/m2  Body mass index is 21.78 kg/(m^2).  GENERAL: vitals reviewed and listed above, alert, oriented, appears well hydrated and in no acute distress HEENT: atraumatic, conjunctiva  clear, no obvious abnormalities on inspection of external nose and ears  NECK: no obvious masses on inspection palpation  LUNGS: clear to auscultation bilaterally, no wheezes, rales or rhonchi, good air movement CV: HRRR, no clubbing cyanosis or  peripheral edema nl cap refill  MS: moves all extremities without noticeable focal  Abnormality Gait easily up from chair walk slightly flat-footed feels woozy when turning. He will sit down with pretty much cheese. Or it though degenerative changes. PSYCH: pleasant and cooperative, no  obvious depression or anxiety, cognition appear to be normal with normal conversation. Lab Results  Component Value Date   WBC 6.8 02/16/2016   HGB 13.4 02/16/2016   HCT 41.9 02/16/2016   PLT 197 02/16/2016  GLUCOSE 101* 04/29/2016   CHOL 149 12/04/2015   TRIG 110.0 12/04/2015   HDL 54.40 12/04/2015   LDLCALC 72 12/04/2015   ALT 11* 10/15/2015   AST 15 10/15/2015   NA 140 04/29/2016   K 4.3 04/29/2016   CL 105 04/29/2016   CREATININE 0.74 04/29/2016   BUN 16 04/29/2016   CO2 25 04/29/2016   TSH 0.94 02/15/2016   INR 1.02 01/12/2016   HGBA1C 5.6 10/09/2015    ASSESSMENT AND PLAN:  Discussed the following assessment and plan:  Unsteadiness - Plan: Ambulatory referral to Physical Therapy  Hx of fall - Plan: Ambulatory referral to Physical Therapy  Age factor  Osteoporosis - due for prolia soon  no fx with alst fall  Hereditary and idiopathic peripheral neuropathy  Hypothyroidism, unspecified hypothyroidism type Reviewed her discouragement history of falling. I suggest we go back to physical therapy for balance training. She has mild peripheral neuropathy and appeared to do well when she is not distracted. Stop talked about distraction training turning vision lighting. Her blood work was done a month ago and was in range. Plan follow-up in 4-5 months or as needed. -Patient advised to return or notify health care team  if symptoms worsen ,persist or new concerns arise. Total visit 61mins > 50% spent counseling and coordinating care as indicated in above note and in instructions to patient .     Patient Instructions  Will arrange a physical therapy  Referral for   Balance training.  I think  You are ok to go back to cardiac rehab .   ROV in 4-5 months       [name] M.D.

## 2016-06-10 ENCOUNTER — Encounter: Payer: Self-pay | Admitting: Internal Medicine

## 2016-06-10 ENCOUNTER — Ambulatory Visit (INDEPENDENT_AMBULATORY_CARE_PROVIDER_SITE_OTHER): Payer: Medicare Other | Admitting: Internal Medicine

## 2016-06-10 VITALS — BP 124/60 | Temp 97.6°F | Ht 62.0 in | Wt 119.1 lb

## 2016-06-10 DIAGNOSIS — Z9181 History of falling: Secondary | ICD-10-CM

## 2016-06-10 DIAGNOSIS — R2681 Unsteadiness on feet: Secondary | ICD-10-CM

## 2016-06-10 DIAGNOSIS — R54 Age-related physical debility: Secondary | ICD-10-CM | POA: Diagnosis not present

## 2016-06-10 DIAGNOSIS — G609 Hereditary and idiopathic neuropathy, unspecified: Secondary | ICD-10-CM

## 2016-06-10 DIAGNOSIS — M810 Age-related osteoporosis without current pathological fracture: Secondary | ICD-10-CM | POA: Diagnosis not present

## 2016-06-10 DIAGNOSIS — E039 Hypothyroidism, unspecified: Secondary | ICD-10-CM

## 2016-06-10 DIAGNOSIS — I2510 Atherosclerotic heart disease of native coronary artery without angina pectoris: Secondary | ICD-10-CM | POA: Diagnosis not present

## 2016-06-10 DIAGNOSIS — IMO0001 Reserved for inherently not codable concepts without codable children: Secondary | ICD-10-CM

## 2016-06-10 NOTE — Patient Instructions (Addendum)
Will arrange a physical therapy  Referral for   Balance training.  I think  You are ok to go back to cardiac rehab .   ROV in 4-5 months

## 2016-06-11 ENCOUNTER — Ambulatory Visit (HOSPITAL_COMMUNITY): Payer: Medicare Other

## 2016-06-12 ENCOUNTER — Ambulatory Visit: Payer: Medicare Other | Attending: Internal Medicine

## 2016-06-12 DIAGNOSIS — M6281 Muscle weakness (generalized): Secondary | ICD-10-CM | POA: Insufficient documentation

## 2016-06-12 DIAGNOSIS — R262 Difficulty in walking, not elsewhere classified: Secondary | ICD-10-CM | POA: Diagnosis not present

## 2016-06-12 NOTE — Patient Instructions (Signed)
Knee High   Holding stable object, raise knee to hip level, then lower knee. Repeat with other knee. Complete __10_ repetitions. Do __2__ sessions per day.  ABDUCTION: Standing (Active)   Stand, feet flat. Lift right leg out to side. Use _0__ lbs. Complete __10_ repetitions. Perform __2_ sessions per day.    EXTENSION: Standing (Active)  Stand, both feet flat. Draw right leg behind body as far as possible. Use 0___ lbs. Complete 10 repetitions. Perform __2_ sessions per day.  Copyright  VHI. All rights reserved.   HIP / KNEE: Extension - Sit to Stand    Sitting, lean chest forward, raise hips up from surface. Straighten hips and knees. Weight bear equally on left and right sides. Backs of legs should not push off surface. _2x10__ reps per set, __2_ sets per day, __7_ days per week Use assistive device as needed.  Copyright  VHI. All rights reserved.  [facility] [address] [place], [place] [address] Phone # [phone] Fax [phone]

## 2016-06-12 NOTE — Therapy (Signed)
[facility] [address] [place], [place], [address] Phone: [phone]   Fax:  [phone]  Physical Therapy Evaluation  Patient Details  Name: Amy Fischer MRN: [id number] Date of Birth: 05-08-1929 Referring Provider: Shanon Ace, MD  Encounter Date: 06/12/2016      PT End of Session - 06/12/16 1046    Visit Number 1   Number of Visits 10   Date for PT Re-Evaluation 08/07/16   PT Start Time 1009   PT Stop Time 1043   PT Time Calculation (min) 34 min   Activity Tolerance Patient tolerated treatment well   Behavior During Therapy [facility] for tasks assessed/performed      Past Medical History  Diagnosis Date  . GERD (gastroesophageal reflux disease)   . Hyperlipidemia   . Hypothyroidism   . Osteoarthritis   . Osteoporosis   . Episodic recurrent vertigo     MRI Head 2003  . Allergy   . Bladder polyps   . Subclavian steal syndrome     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; CTA 2013 revealed open graft  . Hyperplastic colon polyp   . Esophageal spasm   . History of hepatitis     unknown type  . Asymptomatic carotid artery stenosis     R ICA 40% stenosis on CTA 12/2011.  [name] Anemia   . Cataract     BILATERAL-REMOVED  . Elevated blood pressure 03/25/2011    Bp readings borderline today has hx of elvation  In office and ok at home   Not checke recently   She gfeels was elevated from anxiety    . Diverticulosis   . Intestinal metaplasia of gastric mucosa   . Hepatic hemangioma   . CHF (congestive heart failure) [facility])     Past Surgical History  Procedure Laterality Date  . Appendectomy    . Cholecystectomy    . Carotid-subclavian bypass graft Left 1999    for [place] steal syndrome  . Rotator cuff repair Left   . Elbow surgery Left   . Cystectomy Left     hand  . Tubal ligation    . Tonsillectomy    . Colonoscopy    . Tear duct probing  07/2014  . Knee surgery Left 2014  . Tee without  cardioversion N/A 10/03/2015    Procedure: TRANSESOPHAGEAL ECHOCARDIOGRAM (TEE);  Surgeon: Larey Dresser, MD;  Location: [place];  Service: Cardiovascular;  Laterality: N/A;  . Cardiac catheterization N/A 10/04/2015    Procedure: Right/Left Heart Cath and Coronary Angiography;  Surgeon: Belva Crome, MD;  Location: [place] CV [facility];  Service: Cardiovascular;  Laterality: N/A;  . Mitral valve repair N/A 10/10/2015    Procedure: MITRAL VALVE REPAIR (MVR) WITH SIZE 28 CARPENTIER-EDWARDS PHYSIO II ANNULOPLASTY RING;  Surgeon: [name], MD;  Location: [place];  Service: Open Heart Surgery;  Laterality: N/A;  . Tee without cardioversion N/A 10/10/2015    Procedure: TRANSESOPHAGEAL ECHOCARDIOGRAM (TEE);  Surgeon: [name], MD;  Location: [place];  Service: Open Heart Surgery;  Laterality: N/A;    There were no vitals filed for this visit.       Subjective Assessment - 06/12/16 1012    Subjective Pt presents to PT with reports of unsteadiness and fall 1 month ago at [facility].  Pt reports she pulled on a door that was supposed to be pushed and she lost her balance and fell back.  Pt also  reports a fall in January 2017 when she fell backwards when opening a piece of candy while walking.     Pertinent History neuropathy in bil. feet, osteoporosis   How long can you walk comfortably? limited by fear of falling   Patient Stated Goals improve stability and safety   Currently in Pain? Yes   Pain Score 1    Pain Location Rib cage   Pain Orientation Left   Pain Descriptors / Indicators Aching   Pain Type Chronic pain   Pain Onset 1 to 4 weeks ago   Pain Frequency Intermittent   Aggravating Factors  sneezing, coughing and rolling in bed   Pain Relieving Factors change position            [facility] PT Assessment - 06/12/16 0001    Assessment   Medical Diagnosis Hx of fall, unsteadiness   Referring Provider Shanon Ace, MD   Onset Date/Surgical Date 05/14/16   fall at [facility]   Next MD Visit 4 months   Precautions   Precautions Fall   Precaution Comments Osteoporosis   Balance Screen   Has the patient fallen in the past 6 months Yes   How many times? 2  PT will address balance and falls risk   Has the patient had a decrease in activity level because of a fear of falling?  Yes   Is the patient reluctant to leave their home because of a fear of falling?  Yes   [place] residence   Living Arrangements Alone   Type of [place] Access Level entry   [place] One level   Prior Function   Level of [place] Retired   Leisure Pt used to walk but not able to walk due to progressive balance deficits   Cognition   Overall Cognitive Status Within Functional Limits for tasks assessed   Posture/Postural Control   Posture/Postural Control Postural limitations   Postural Limitations Rounded Shoulders;Forward head;Flexed trunk   ROM / Strength   AROM / PROM / Strength AROM;Strength   AROM   Overall AROM  Deficits   Overall AROM Comments hip AROM limited by 25-50% throughout   Strength   Overall Strength Within functional limits for tasks performed   Overall Strength Comments LE strength 4+/5 throughout    Palpation   Palpation comment NA   Transfers   Transfers Sit to Stand;Stand to Sit   Sit to Stand 6: Modified independent (Device/Increase time)   Ambulation/Gait   Ambulation/Gait Yes   Ambulation/Gait Assistance 6: Modified independent (Device/Increase time)   Ambulation Distance (Feet) 100 Feet   Gait Pattern Decreased dorsiflexion - left;Decreased dorsiflexion - right;Decreased stride length;Decreased trunk rotation;Trunk flexed   Ambulation Surface Level   Balance   Balance Assessed Yes   Standardized Balance Assessment   Standardized Balance Assessment Timed Up and Go Test;Five Times Sit to Stand   Five times sit to stand comments  14 seconds    Timed Up and Go Test   TUG Normal TUG   Normal TUG (seconds) 13   High Level Balance   High Level Balance Comments Feet together eyes open x 10 seconds with minimal sway, feet together eyes closed with moderate sway x 10 seconds.  Single leg stance Rt 2 seconds, Lt 5 seconds  PT Education - 06/12/16 1034    Education provided Yes   Education Details HEP: sit to stand, standing hip strength   Person(s) Educated Patient   Methods Explanation;Demonstration;Handout   Comprehension Verbalized understanding          PT Short Term Goals - 06/12/16 1050    PT SHORT TERM GOAL #1   Title be independent in initial HEP   Time 4   Period Weeks   Status New   PT SHORT TERM GOAL #2   Title perform single leg stance on Rt and Lt x 4-5 seconds to improve stability   Time 4   Period Weeks   Status New   PT SHORT TERM GOAL #3   Title perform TUG in < or = to 12 seconds to improve stability   Time 4   Period Weeks   Status New   PT SHORT TERM GOAL #4   Title verbalize understanding of correct posture, body mechanics and education regarding osteoprosis   Time 4   Period Weeks   Status New           PT Long Term Goals - 06/12/16 1051    PT LONG TERM GOAL #1   Title be independent in advanced HEP   Time 8   Period Weeks   Status New   PT LONG TERM GOAL #2   Title report a 50% improved confidence with gait in the community   Time 8   Period Weeks   Status New   PT LONG TERM GOAL #3   Title perform 5x sit to stand in < or = to 12 seconds to improve stabiltiy   Time 8   Period Weeks   Status New   PT LONG TERM GOAL #4   Title report no falls x 4 weeks   Time 8   Period Weeks   Status New               Plan - 06/12/16 1047    Clinical Impression Statement Pt presents to PT with report of progressive instability and reports 2 falls in the past 6 months.  Pt demonstrates instability with gait on level surface, TUG 13 seconds  and 5x sit to stand 14 seconds.  Pt will benefit from skilled PT for balance training and stability to improve safety at home and in the community.     Rehab Potential Good   PT Frequency 2x / week   PT Duration 8 weeks   PT Treatment/Interventions ADLs/Self Care Home Management;Cryotherapy;Moist Heat;Therapeutic exercise;Therapeutic activities;Functional mobility training;Stair training;Gait training;Neuromuscular re-education;Patient/family education;Manual techniques;Passive range of motion   PT Next Visit Plan Gait and balance training   Consulted and Agree with Plan of Care Patient      Patient will benefit from skilled therapeutic intervention in order to improve the following deficits and impairments:  Abnormal gait, Decreased range of motion, Difficulty walking, Postural dysfunction, Decreased activity tolerance, Decreased safety awareness  Visit Diagnosis: Muscle weakness (generalized) - Plan: PT plan of care cert/re-cert  Difficulty in walking, not elsewhere classified - Plan: PT plan of care cert/re-cert      G-Codes - [id number]    Functional Assessment Tool Used TUG and 5x sit to stand   Functional Limitation Mobility: Walking and moving around   Mobility: Walking and Moving Around Current Status [id number]) At least 20 percent but less than 40 percent impaired, limited or restricted   Mobility: Walking and Moving Around Goal Status [id number]) At least  20 percent but less than 40 percent impaired, limited or restricted       Problem List Patient Active Problem List   Diagnosis Date Noted  . CAD (coronary artery disease) 02/22/2016  . Unsteadiness 11/20/2015  . PAF (paroxysmal atrial fibrillation) ([place]) 11/06/2015  . Chronic diastolic CHF (congestive heart failure), NYHA class 2 ([place]) 11/06/2015  . S/P MVR (mitral valve repair) 10/10/2015  . Severe mitral regurgitation   . Shortness of breath   . Acute congestive heart failure ([place])   . Left hip pain   . Mitral  regurgitation 10/02/2015  . Paroxysmal atrial fibrillation ([place]) 10/01/2015  . Acute respiratory failure with hypoxia and hypercapnia (HCC)   . Respiratory distress 09/30/2015  . Acute on chronic diastolic congestive heart failure, NYHA class 3 ([place]) 09/30/2015  . Hx of fracture 04/26/2015  . Diplopia 04/26/2015  . Medicare annual wellness visit, subsequent 07/26/2014  . Diarrhea 03/30/2014  . Rhinitis 10/06/2013  . Recurrent vomiting 10/06/2013  . Hx of vomiting 05/26/2013  . Carotid artery disease without cerebral infarction ([place]) 03/22/2013  . Hypertension, isolated systolic 0000000  . Bereavement 03/22/2013  . Fracture, finger 07/13/2012  . Patellar fracture 07/13/2012  . Left-sided chest wall pain 04/10/2012  . Neuritis 04/10/2012  . Constipation, other cause 03/31/2012  . Scapular fracture 03/31/2012  . Hx of fall 03/31/2012  . Esophageal disorder 11/27/2011  . Atypical chest pain 11/27/2011  . Dizziness 11/27/2011  . Postmenopausal HRT (hormone replacement therapy) 09/23/2011  . Elevated blood pressure 03/25/2011  . Subclavian steal syndrome 09/24/2010  . GAIT IMBALANCE 03/19/2010  . SKIN LESION, UNCERTAIN SIGNIFICANCE 09/18/2009  . SLEEPLESSNESS 03/20/2009  . HYPERGLYCEMIA 03/20/2009  . VITAMIN D DEFICIENCY 11/09/2007  . ALLERGIC RHINITIS 11/09/2007  . LEG PAIN, BILATERAL 11/09/2007  . Hypothyroidism 08/19/2007  . HYPERLIPIDEMIA 08/19/2007  . GERD 08/19/2007  . OSTEOARTHRITIS 08/19/2007  . Osteoporosis 08/19/2007     Sigurd Sos, PT 06/12/2016 10:55 AM  [facility] [address] [place], [place], [address] Phone: [phone]   Fax:  [phone]  Name: Amy Fischer MRN: [id number] Date of Birth: 08/19/1929

## 2016-06-13 ENCOUNTER — Ambulatory Visit (HOSPITAL_COMMUNITY): Payer: Medicare Other

## 2016-06-16 ENCOUNTER — Ambulatory Visit (HOSPITAL_COMMUNITY): Payer: Medicare Other

## 2016-06-17 ENCOUNTER — Ambulatory Visit: Payer: Medicare Other

## 2016-06-17 ENCOUNTER — Other Ambulatory Visit: Payer: Self-pay

## 2016-06-17 DIAGNOSIS — M6281 Muscle weakness (generalized): Secondary | ICD-10-CM | POA: Diagnosis not present

## 2016-06-17 DIAGNOSIS — R262 Difficulty in walking, not elsewhere classified: Secondary | ICD-10-CM | POA: Diagnosis not present

## 2016-06-17 NOTE — Patient Outreach (Signed)
[facility]) Care Management  06/17/2016  Amy Fischer 1929-08-12 [id number]   Telephone call to patient for monthly call.  No answer. HIPAA compliant voice message left.    Plan: RN Health Coach will attempt patient within 3 weeks.  Jone Baseman, RN, MSN [place] [phone]

## 2016-06-17 NOTE — Therapy (Signed)
[facility] [address], [place], [place], [address] Phone: [phone]   Fax:  [phone]  Physical Therapy Treatment  Patient Details  Name: Amy Fischer MRN: [id number] Date of Birth: [birth year] Referring Provider: Shanon Ace, MD  Encounter Date: 06/17/2016      PT End of Session - 06/17/16 1144    Visit Number 2   Number of Visits 10   Date for PT Re-Evaluation 08/07/16   PT Start Time 1100   PT Stop Time 1144   PT Time Calculation (min) 44 min   Activity Tolerance Patient tolerated treatment well   Behavior During Therapy [facility] for tasks assessed/performed      Past Medical History  Diagnosis Date  . GERD (gastroesophageal reflux disease)   . Hyperlipidemia   . Hypothyroidism   . Osteoarthritis   . Osteoporosis   . Episodic recurrent vertigo     MRI Head 2003  . Allergy   . Bladder polyps   . Subclavian steal syndrome     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; CTA 2013 revealed open graft  . Hyperplastic colon polyp   . Esophageal spasm   . History of hepatitis     unknown type  . Asymptomatic carotid artery stenosis     R ICA 40% stenosis on CTA 12/2011.  [name] Anemia   . Cataract     BILATERAL-REMOVED  . Elevated blood pressure 03/25/2011    Bp readings borderline today has hx of elvation  In office and ok at home   Not checke recently   She gfeels was elevated from anxiety    . Diverticulosis   . Intestinal metaplasia of gastric mucosa   . Hepatic hemangioma   . CHF (congestive heart failure) [facility])     Past Surgical History  Procedure Laterality Date  . Appendectomy    . Cholecystectomy    . Carotid-subclavian bypass graft Left 1999    for [place] steal syndrome  . Rotator cuff repair Left   . Elbow surgery Left   . Cystectomy Left     hand  . Tubal ligation    . Tonsillectomy    . Colonoscopy    . Tear duct probing  07/2014  . Knee surgery Left 2014  . Tee without  cardioversion N/A 10/03/2015    Procedure: TRANSESOPHAGEAL ECHOCARDIOGRAM (TEE);  Surgeon: Larey Dresser, MD;  Location: [place];  Service: Cardiovascular;  Laterality: N/A;  . Cardiac catheterization N/A 10/04/2015    Procedure: Right/Left Heart Cath and Coronary Angiography;  Surgeon: Belva Crome, MD;  Location: [place] [facility];  Service: Cardiovascular;  Laterality: N/A;  . Mitral valve repair N/A 10/10/2015    Procedure: MITRAL VALVE REPAIR (MVR) WITH SIZE 28 CARPENTIER-EDWARDS PHYSIO II ANNULOPLASTY RING;  Surgeon: [name], MD;  Location: [place];  Service: Open Heart Surgery;  Laterality: N/A;  . Tee without cardioversion N/A 10/10/2015    Procedure: TRANSESOPHAGEAL ECHOCARDIOGRAM (TEE);  Surgeon: [name], MD;  Location: [place];  Service: Open Heart Surgery;  Laterality: N/A;    There were no vitals filed for this visit.      Subjective Assessment - 06/17/16 1102    Subjective I've been doing my exercises 3x/day. I'm here to work.     Patient Stated Goals improve stability and safety   Currently in Pain? No/denies  [facility] PT Treatment/Exercise - 06/17/16 0001    Exercises   Exercises Knee/Hip;Lumbar   Knee/Hip Exercises: Aerobic   Stationary Bike Level 1x 8 minutes   Knee/Hip Exercises: Standing   Hip Flexion Stengthening;Both;Knee bent;2 sets;10 reps   Hip Abduction Stengthening;Both;2 sets;10 reps   Hip Extension Stengthening;Both;2 sets;10 reps   Forward Step Up Both;2 sets;10 reps;Hand Hold: 1   Rocker Board 3 minutes   Rebounder 3 directions x 1 minute each   Other Standing Knee Exercises toe taps on 6" step: alternaing 2x20 taps without UE support   Knee/Hip Exercises: Seated   Sit to Sand 20 reps;without UE support                PT Education - 06/17/16 1106    Education provided Yes   Education Details osteo info   Person(s) Educated Patient   Methods Explanation;Handout    Comprehension Verbalized understanding          PT Short Term Goals - 06/17/16 1103    PT SHORT TERM GOAL #1   Title be independent in initial HEP   Time 4   Period Weeks   Status On-going   PT SHORT TERM GOAL #2   Title perform single leg stance on Rt and Lt x 4-5 seconds to improve stability   Time 4   Period Weeks   Status On-going   PT SHORT TERM GOAL #3   Title perform TUG in < or = to 12 seconds to improve stability   Time 4   Period Weeks   Status On-going           PT Long Term Goals - 06/12/16 1051    PT LONG TERM GOAL #1   Title be independent in advanced HEP   Time 8   Period Weeks   Status New   PT LONG TERM GOAL #2   Title report a 50% improved confidence with gait in the community   Time 8   Period Weeks   Status New   PT LONG TERM GOAL #3   Title perform 5x sit to stand in < or = to 12 seconds to improve stabiltiy   Time 8   Period Weeks   Status New   PT LONG TERM GOAL #4   Title report no falls x 4 weeks   Time 8   Period Weeks   Status New               Plan - 06/17/16 1104    Clinical Impression Statement Pt with 1st session after evaluation.  Pt is independent in initial HEP for LE strength.  Pt with progressive instabiltiy and 2 falls in the past 6 months.  Pt demonstrates instability with gait on level surface and limited LE endurance and strength.  Pt will continue to benefit from skilled PT for endurance, strength and balance training.     Rehab Potential Good   PT Frequency 2x / week   PT Duration 8 weeks   PT Treatment/Interventions ADLs/Self Care Home Management;Cryotherapy;Moist Heat;Therapeutic exercise;Therapeutic activities;Functional mobility training;Stair training;Gait training;Neuromuscular re-education;Patient/family education;Manual techniques;Passive range of motion   PT Next Visit Plan Gait and balance training   Consulted and Agree with Plan of Care Patient      Patient will benefit from skilled therapeutic  intervention in order to improve the following deficits and impairments:  Abnormal gait, Decreased range of motion, Difficulty walking, Postural dysfunction, Decreased activity tolerance, Decreased safety awareness  Visit Diagnosis:  Muscle weakness (generalized)  Difficulty in walking, not elsewhere classified     Problem List Patient Active Problem List   Diagnosis Date Noted  . CAD (coronary artery disease) 02/22/2016  . Unsteadiness 11/20/2015  . PAF (paroxysmal atrial fibrillation) ([place]) 11/06/2015  . Chronic diastolic CHF (congestive heart failure), NYHA class 2 ([place]) 11/06/2015  . S/P MVR (mitral valve repair) 10/10/2015  . Severe mitral regurgitation   . Shortness of breath   . Acute congestive heart failure ([place])   . Left hip pain   . Mitral regurgitation 10/02/2015  . Paroxysmal atrial fibrillation ([place]) 10/01/2015  . Acute respiratory failure with hypoxia and hypercapnia (HCC)   . Respiratory distress 09/30/2015  . Acute on chronic diastolic congestive heart failure, NYHA class 3 ([place]) 09/30/2015  . Hx of fracture 04/26/2015  . Diplopia 04/26/2015  . Medicare annual wellness visit, subsequent 07/26/2014  . Diarrhea 03/30/2014  . Rhinitis 10/06/2013  . Recurrent vomiting 10/06/2013  . Hx of vomiting 05/26/2013  . Carotid artery disease without cerebral infarction ([place]) 03/22/2013  . Hypertension, isolated systolic 0000000  . Bereavement 03/22/2013  . Fracture, finger 07/13/2012  . Patellar fracture 07/13/2012  . Left-sided chest wall pain 04/10/2012  . Neuritis 04/10/2012  . Constipation, other cause 03/31/2012  . Scapular fracture 03/31/2012  . Hx of fall 03/31/2012  . Esophageal disorder 11/27/2011  . Atypical chest pain 11/27/2011  . Dizziness 11/27/2011  . Postmenopausal HRT (hormone replacement therapy) 09/23/2011  . Elevated blood pressure 03/25/2011  . Subclavian steal syndrome 09/24/2010  . GAIT IMBALANCE 03/19/2010  . SKIN LESION, UNCERTAIN  SIGNIFICANCE 09/18/2009  . SLEEPLESSNESS 03/20/2009  . HYPERGLYCEMIA 03/20/2009  . VITAMIN D DEFICIENCY 11/09/2007  . ALLERGIC RHINITIS 11/09/2007  . LEG PAIN, BILATERAL 11/09/2007  . Hypothyroidism 08/19/2007  . HYPERLIPIDEMIA 08/19/2007  . GERD 08/19/2007  . OSTEOARTHRITIS 08/19/2007  . Osteoporosis 08/19/2007     Sigurd Sos, PT 06/17/2016 11:46 AM  [facility] [address] [place], [place], [address] Phone: [phone]   Fax:  [phone]  Name: Amy Fischer MRN: [id number] Date of Birth: 05-Jan-1929

## 2016-06-17 NOTE — Patient Instructions (Signed)
DO's and DON'T's   Avoid and/or Minimize positions of forward bending ( flexion)  Side bending and rotation of the trunk  Especially when movements occur together   When your back aches:   Don't sit down   Lie down on your back with a small pillow under your head and one under your knees or as outlined by our therapist. Or, lie in the 90/90 position ( on the floor with your feet and legs on the sofa with knees and hips bent to 90 degrees)  Tying or putting on your shoes:   Don't bend over to tie your shoes or put on socks.  Instead, bring one foot up, cross it over the opposite knee and bend forward (hinge) at the hips to so the task.  Keep your back straight.  If you cannot do this safely, then you need to use long handled assistive devices such as a shoehorn and sock puller.  Exercising:  Don't engage in ballistic types of exercise routines such as high-impact aerobics or jumping rope  Don't do exercises in the gym that bring you forward (abdominal crunches, sit-ups, touching your  toes, knee-to-chest, straight leg raising.)  Follow a regular exercise program that includes a variety of different weight-bearing activities, such as low-impact aerobics, T' ai chi or walking as your physical therapist advises  Do exercises that emphasize return to normal body alignment and strengthening of the muscles that keep your back straight, as outlined in this program or by your therapist  Household tasks:  Don't reach unnecessarily or twist your trunk when mopping, sweeping, vacuuming, raking, making beds, weeding gardens, getting objects ou of cupboards, etc.  Keep your broom, mop, vacuum, or rake close to you and mover your whole body as you move them. Walk over to the area on which you are working. Arrange kitchen, bathroom, and bedroom shelves so that frequently used items may be reached without excessive bending, twisting, and reaching.  Use a  sturdy stool if necessary.  Don't bend from the waist to pick up something up  Off the floor, out of the trunk of your car, or to brush your teeth, wash your face, etc.   Bend at the knees, keeping back straight as possible. Use a reacher if necessary.   Prevention of fracture is the so-called "BOTTOm -Line" in the management of OSTEOPOROSIS. Do not take unnecessary chances in movement. Once a compression fracture occurs, the process is very difficult to control; one fracture is frequently followed by many more.   [facility] [address], [address] , [place] [address] Phone # [phone] Fax [phone] 

## 2016-06-18 ENCOUNTER — Ambulatory Visit (HOSPITAL_COMMUNITY): Payer: Medicare Other

## 2016-06-19 ENCOUNTER — Ambulatory Visit: Payer: Medicare Other

## 2016-06-19 ENCOUNTER — Inpatient Hospital Stay (HOSPITAL_COMMUNITY): Admission: RE | Admit: 2016-06-19 | Payer: Medicare Other | Source: Ambulatory Visit

## 2016-06-19 DIAGNOSIS — R262 Difficulty in walking, not elsewhere classified: Secondary | ICD-10-CM | POA: Diagnosis not present

## 2016-06-19 DIAGNOSIS — M6281 Muscle weakness (generalized): Secondary | ICD-10-CM | POA: Diagnosis not present

## 2016-06-19 NOTE — Patient Instructions (Signed)
Anterior Step-Up    Stand with ___ inch step placed in A direction. Step onto step with right foot without pushing off with the ground foot. Finish with ground foot in touch balance on step and return _10__ times. __2_ sets __2_ times per day.  http://gglj.exer.us/161   Copyright  VHI. All rights reserved.  Heel Raises    Stand with support. With knees straight, raise heels off ground.  Repeat _2x10__ times. Do _2__ times a day.  Copyright  VHI. All rights reserved.  [place] [address], [place] [employer], [place] [address] Phone # [phone] Fax [phone]

## 2016-06-19 NOTE — Therapy (Signed)
[facility] [address], [place], [place], [address] Phone: [phone]   Fax:  [phone]  Physical Therapy Treatment  Patient Details  Name: Amy Fischer MRN: [id number] Date of Birth: 09-12-1929 Referring Provider: Shanon Ace, MD  Encounter Date: 06/19/2016      PT End of Session - 06/19/16 1214    Visit Number 3   Number of Visits 10   Date for PT Re-Evaluation 08/07/16   PT Start Time [id number]   PT Stop Time 1218   PT Time Calculation (min) 39 min   Activity Tolerance Patient tolerated treatment well   Behavior During Therapy [facility] for tasks assessed/performed      Past Medical History  Diagnosis Date  . GERD (gastroesophageal reflux disease)   . Hyperlipidemia   . Hypothyroidism   . Osteoarthritis   . Osteoporosis   . Episodic recurrent vertigo     MRI Head 2003  . Allergy   . Bladder polyps   . Subclavian steal syndrome     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; CTA 2013 revealed open graft  . Hyperplastic colon polyp   . Esophageal spasm   . History of hepatitis     unknown type  . Asymptomatic carotid artery stenosis     R ICA 40% stenosis on CTA 12/2011.  [name] Anemia   . Cataract     BILATERAL-REMOVED  . Elevated blood pressure 03/25/2011    Bp readings borderline today has hx of elvation  In office and ok at home   Not checke recently   She gfeels was elevated from anxiety    . Diverticulosis   . Intestinal metaplasia of gastric mucosa   . Hepatic hemangioma   . CHF (congestive heart failure) [facility])     Past Surgical History  Procedure Laterality Date  . Appendectomy    . Cholecystectomy    . Carotid-subclavian bypass graft Left 1999    for [place] steal syndrome  . Rotator cuff repair Left   . Elbow surgery Left   . Cystectomy Left     hand  . Tubal ligation    . Tonsillectomy    . Colonoscopy    . Tear duct probing  07/2014  . Knee surgery Left 2014  . Tee without  cardioversion N/A 10/03/2015    Procedure: TRANSESOPHAGEAL ECHOCARDIOGRAM (TEE);  Surgeon: Larey Dresser, MD;  Location: [place];  Service: Cardiovascular;  Laterality: N/A;  . Cardiac catheterization N/A 10/04/2015    Procedure: Right/Left Heart Cath and Coronary Angiography;  Surgeon: Belva Crome, MD;  Location: [place] CV [facility];  Service: Cardiovascular;  Laterality: N/A;  . Mitral valve repair N/A 10/10/2015    Procedure: MITRAL VALVE REPAIR (MVR) WITH SIZE 28 CARPENTIER-EDWARDS PHYSIO II ANNULOPLASTY RING;  Surgeon: [name], MD;  Location: [place];  Service: Open Heart Surgery;  Laterality: N/A;  . Tee without cardioversion N/A 10/10/2015    Procedure: TRANSESOPHAGEAL ECHOCARDIOGRAM (TEE);  Surgeon: [name], MD;  Location: [place];  Service: Open Heart Surgery;  Laterality: N/A;    There were no vitals filed for this visit.      Subjective Assessment - 06/19/16 1147    Subjective I would like to have more exercises at home.     Patient Stated Goals improve stability and safety   Currently in Pain? No/denies  [place] Adult PT Treatment/Exercise - 06/19/16 0001    Knee/Hip Exercises: Aerobic   Nustep Level 2 x 8 minutes   seat 7, arms 9   Knee/Hip Exercises: Standing   Hip Flexion Stengthening;Both;Knee bent;2 sets;10 reps   Hip Flexion Limitations 2# added   Hip Abduction Stengthening;Both;2 sets;10 reps   Abduction Limitations 2# added   Hip Extension Stengthening;Both;2 sets;10 reps   Extension Limitations 2# added   Lateral Step Up Both;2 sets;10 reps   Forward Step Up Both;2 sets;10 reps;Hand Hold: 1   Rocker Board 3 minutes   Rebounder 3 directions x 1 minute each   Other Standing Knee Exercises toe taps on 6" step: alternaing 2x20 taps without UE support   Knee/Hip Exercises: Seated   Long Arc Quad Both;2 sets;10 reps;Weights   Long Arc Quad Weight 2 lbs.                PT Education -  06/19/16 1158    Education provided Yes   Education Details HEP: step ups and heel raises   Person(s) Educated Patient   Methods Explanation;Handout;Demonstration   Comprehension Verbalized understanding;Returned demonstration          PT Short Term Goals - 06/17/16 1103    PT SHORT TERM GOAL #1   Title be independent in initial HEP   Time 4   Period Weeks   Status On-going   PT SHORT TERM GOAL #2   Title perform single leg stance on Rt and Lt x 4-5 seconds to improve stability   Time 4   Period Weeks   Status On-going   PT SHORT TERM GOAL #3   Title perform TUG in < or = to 12 seconds to improve stability   Time 4   Period Weeks   Status On-going           PT Long Term Goals - 06/12/16 1051    PT LONG TERM GOAL #1   Title be independent in advanced HEP   Time 8   Period Weeks   Status New   PT LONG TERM GOAL #2   Title report a 50% improved confidence with gait in the community   Time 8   Period Weeks   Status New   PT LONG TERM GOAL #3   Title perform 5x sit to stand in < or = to 12 seconds to improve stabiltiy   Time 8   Period Weeks   Status New   PT LONG TERM GOAL #4   Title report no falls x 4 weeks   Time 8   Period Weeks   Status New               Plan - 06/19/16 1150    Clinical Impression Statement Pt with continued balance and strength deficits.  PT advanced HEP today for strength progression at home. Pt tolerated addition of 2# weights in the clinic today.  Pt demonstrates instability with gait on level surface and limited endurance and strength.  Pt will continue to benefit from skilled PT for endurance, strength and balance training.     Rehab Potential Good   PT Frequency 2x / week   PT Duration 8 weeks   PT Treatment/Interventions ADLs/Self Care Home Management;Cryotherapy;Moist Heat;Therapeutic exercise;Therapeutic activities;Functional mobility training;Stair training;Gait training;Neuromuscular re-education;Patient/family  education;Manual techniques;Passive range of motion   PT Next Visit Plan Gait and balance training.  Review HEP.  Try resisted walking   Consulted and Agree with Plan of Care  Patient      Patient will benefit from skilled therapeutic intervention in order to improve the following deficits and impairments:  Abnormal gait, Decreased range of motion, Difficulty walking, Postural dysfunction, Decreased activity tolerance, Decreased safety awareness  Visit Diagnosis: Muscle weakness (generalized)  Difficulty in walking, not elsewhere classified     Problem List Patient Active Problem List   Diagnosis Date Noted  . CAD (coronary artery disease) 02/22/2016  . Unsteadiness 11/20/2015  . PAF (paroxysmal atrial fibrillation) ([place]) 11/06/2015  . Chronic diastolic CHF (congestive heart failure), NYHA class 2 ([place]) 11/06/2015  . S/P MVR (mitral valve repair) 10/10/2015  . Severe mitral regurgitation   . Shortness of breath   . Acute congestive heart failure ([place])   . Left hip pain   . Mitral regurgitation 10/02/2015  . Paroxysmal atrial fibrillation ([place]) 10/01/2015  . Acute respiratory failure with hypoxia and hypercapnia (HCC)   . Respiratory distress 09/30/2015  . Acute on chronic diastolic congestive heart failure, NYHA class 3 ([place]) 09/30/2015  . Hx of fracture 04/26/2015  . Diplopia 04/26/2015  . Medicare annual wellness visit, subsequent 07/26/2014  . Diarrhea 03/30/2014  . Rhinitis 10/06/2013  . Recurrent vomiting 10/06/2013  . Hx of vomiting 05/26/2013  . Carotid artery disease without cerebral infarction ([place]) 03/22/2013  . Hypertension, isolated systolic 0000000  . Bereavement 03/22/2013  . Fracture, finger 07/13/2012  . Patellar fracture 07/13/2012  . Left-sided chest wall pain 04/10/2012  . Neuritis 04/10/2012  . Constipation, other cause 03/31/2012  . Scapular fracture 03/31/2012  . Hx of fall 03/31/2012  . Esophageal disorder 11/27/2011  . Atypical chest pain  11/27/2011  . Dizziness 11/27/2011  . Postmenopausal HRT (hormone replacement therapy) 09/23/2011  . Elevated blood pressure 03/25/2011  . Subclavian steal syndrome 09/24/2010  . GAIT IMBALANCE 03/19/2010  . SKIN LESION, UNCERTAIN SIGNIFICANCE 09/18/2009  . SLEEPLESSNESS 03/20/2009  . HYPERGLYCEMIA 03/20/2009  . VITAMIN D DEFICIENCY 11/09/2007  . ALLERGIC RHINITIS 11/09/2007  . LEG PAIN, BILATERAL 11/09/2007  . Hypothyroidism 08/19/2007  . HYPERLIPIDEMIA 08/19/2007  . GERD 08/19/2007  . OSTEOARTHRITIS 08/19/2007  . Osteoporosis 08/19/2007     Sigurd Sos, PT 06/19/2016 12:15 PM  [facility] [address], [place], [place], [address] Phone: [phone]   Fax:  [phone]  Name: Amy Fischer MRN: [id number] Date of Birth: 23-Mar-1929

## 2016-06-20 ENCOUNTER — Ambulatory Visit (HOSPITAL_COMMUNITY): Payer: Medicare Other

## 2016-06-23 ENCOUNTER — Other Ambulatory Visit: Payer: Self-pay

## 2016-06-23 ENCOUNTER — Ambulatory Visit (HOSPITAL_COMMUNITY): Payer: Medicare Other

## 2016-06-23 ENCOUNTER — Ambulatory Visit: Payer: Self-pay

## 2016-06-23 NOTE — Patient Outreach (Signed)
[facility]) Care Management  [place]  06/23/2016   Amy Fischer Mar 24, 1929 [id number]  Subjective: Telephone call to patient for monthly outreach. Patient reports she is doing good.  Patient with history of falls is now seeing therapy for balance issues. Patient denies any falls this month.  Discussed fall precautions.  Patient reports she has gained some weight for which she says she needed to gain some. Patient denies shortness of breath or swelling. Discussed with patient heart failure action plan and when to notify physician.    Objective:   Encounter Medications:  Outpatient Encounter Prescriptions as of 06/23/2016  Medication Sig  . acetaminophen (TYLENOL) 325 MG tablet Take 2 tablets (650 mg total) by mouth every 6 (six) hours as needed for mild pain.  [name] aspirin EC 81 MG EC tablet Take 1 tablet (81 mg total) by mouth daily. (Patient taking differently: Take 81 mg by mouth at bedtime. )  . budesonide-formoterol (SYMBICORT) 160-4.5 MCG/ACT inhaler Inhale 2 puffs into the lungs 2 (two) times daily. Or as directed (Patient taking differently: Inhale 2 puffs into the lungs 2 (two) times daily as needed. Or as directed)  . DiphenhydrAMINE HCl (BENADRYL PO) Take by mouth.  . docusate sodium (COLACE) 100 MG capsule Take 100 mg by mouth daily as needed for mild constipation.  . fish oil-omega-3 fatty acids 1000 MG capsule Take 1 g by mouth daily.   . folic acid (FOLVITE) 1 MG tablet Take 1 tablet (1 mg total) by mouth daily.  [name] glucosamine-chondroitin 500-400 MG tablet Take 1 tablet by mouth daily.   [name] ipratropium (ATROVENT) 0.03 % nasal spray Place 2 sprays into both nostrils 3 (three) times daily as needed for rhinitis.  [name] levothyroxine (SYNTHROID, LEVOTHROID) 100 MCG tablet take 1 tablet by mouth every morning ON AN EMPTY STOMACH  . Melatonin 3 MG TABS Take 3 mg by mouth at bedtime.   . MULTIPLE VITAMIN PO Take 1 tablet by mouth daily. 50+ Senior Vitamin Daily   . omeprazole (PRILOSEC) 40 MG capsule TAKE 1 CAPSULE DAILY (Patient taking differently: TAKE 1 CAPSULE DAILY before dinner)  . Polyethyl Glycol-Propyl Glycol (SYSTANE OP) Place 1 drop into both eyes 2 (two) times daily. Use 1-2 Drops in both eyes  . PROAIR HFA 108 (90 Base) MCG/ACT inhaler Inhale 2 puffs into the lungs every 6 (six) hours as needed for wheezing or shortness of breath  . SALINE NASAL SPRAY NA Place 1 spray into both nostrils 2 (two) times daily as needed (congestion).   . traMADol (ULTRAM) 50 MG tablet Take 1 tablet (50 mg total) by mouth every 6 (six) hours as needed.  . vitamin E 400 UNIT capsule Take 400 Units by mouth daily.  [name] ZETIA 10 MG tablet TAKE 1 TABLET DAILY  . CRESTOR 5 MG tablet TAKE ONE-HALF (1/2) TABLET DAILY (Patient not taking: Reported on 06/23/2016)   No facility-administered encounter medications on file as of 06/23/2016.    Functional Status:  In your present state of health, do you have any difficulty performing the following activities: 01/31/2016  Hearing? N  Vision? N  Difficulty concentrating or making decisions? N  Walking or climbing stairs? Y  Dressing or bathing? N  Doing errands, shopping? Y  Preparing Food and eating ? N  Using the Toilet? N  In the past six months, have you accidently leaked urine? N  Do you have problems with loss of bowel control? N  Managing your Medications? N  Managing your Finances? N  Housekeeping or managing your Housekeeping? Y    Fall/Depression Screening: PHQ 2/9 Scores 06/23/2016 05/22/2016 04/23/2016 03/21/2016 02/21/2016 01/31/2016 10/29/2015  PHQ - 2 Score 0 0 0 0 0 0 5  PHQ- 9 Score - - - - - - 14    Assessment: Patient continues to benefit from health coach outreach for disease management and support.    Plan:  [facility] CM Care Plan Problem One        Most Recent Value   Care Plan Problem One  Heart Failure knowledge deficit   Role Documenting the Problem One  Health Coach   Care Plan for Problem One   Active   THN Long Term Goal (31-90 days)  Patient will verbalize signs and symptoms of heart failure within 90 days.   THN Long Term Goal Start Date  05/22/16 [goal continued]   Interventions for Problem One Long Term Goal  RN Health Coach reviewed signs and symptoms of heart failure with patient.   THN CM Short Term Goal #1 (0-30 days)  Patient will verbalize yellow zone of heart failure chart within 30 days   THN CM Short Term Goal #1 Start Date  06/23/16 [name] continued]   Interventions for Short Term Goal #1  RN Health Coach reviewed yellow zone of heart failure chart with patient.    THN CM Care Plan Problem Two        Most Recent Value   Care Plan Problem Two  Recent Fall   Role Documenting the Problem Two  [place] for Problem Two  Active   THN CM Short Term Goal #1 (0-30 days)  Patient will report no falls within the next 30 days.   THN CM Short Term Goal #1 Start Date  01/31/16   [facility] CM Short Term Goal #1 Met Date   06/23/16   Interventions for Short Term Goal #2   Patient has had no falls in the last 30 days.      RN Health Coach will contact patient within one month and patient agrees to next outreach.  Jone Baseman, RN, MSN [place] [phone]

## 2016-06-25 ENCOUNTER — Other Ambulatory Visit: Payer: Self-pay | Admitting: Internal Medicine

## 2016-06-25 ENCOUNTER — Ambulatory Visit (HOSPITAL_COMMUNITY): Payer: Medicare Other

## 2016-06-25 NOTE — Telephone Encounter (Signed)
Sent to the pharmacy by e-scribe. 

## 2016-06-27 ENCOUNTER — Ambulatory Visit (HOSPITAL_COMMUNITY): Payer: Medicare Other

## 2016-06-30 ENCOUNTER — Ambulatory Visit (HOSPITAL_COMMUNITY): Payer: Medicare Other

## 2016-06-30 ENCOUNTER — Ambulatory Visit (INDEPENDENT_AMBULATORY_CARE_PROVIDER_SITE_OTHER): Payer: Medicare Other | Admitting: Family Medicine

## 2016-06-30 DIAGNOSIS — M810 Age-related osteoporosis without current pathological fracture: Secondary | ICD-10-CM | POA: Diagnosis not present

## 2016-06-30 MED ORDER — DENOSUMAB 60 MG/ML ~~LOC~~ SOLN
60.0000 mg | Freq: Once | SUBCUTANEOUS | Status: AC
  Administered 2016-06-30: 60 mg via SUBCUTANEOUS

## 2016-07-02 ENCOUNTER — Ambulatory Visit (HOSPITAL_COMMUNITY): Payer: Medicare Other

## 2016-07-03 ENCOUNTER — Ambulatory Visit: Payer: Medicare Other | Attending: Internal Medicine

## 2016-07-03 DIAGNOSIS — R262 Difficulty in walking, not elsewhere classified: Secondary | ICD-10-CM | POA: Diagnosis not present

## 2016-07-03 DIAGNOSIS — M6281 Muscle weakness (generalized): Secondary | ICD-10-CM | POA: Insufficient documentation

## 2016-07-03 NOTE — Therapy (Signed)
[facility] [address], [place], [place], [address] Phone: [phone]   Fax:  [phone]  Physical Therapy Treatment  Patient Details  Name: Amy Fischer MRN: [id number] Date of Birth: Sep 04, 1929 Referring Provider: Shanon Ace, MD  Encounter Date: 07/03/2016      PT End of Session - 07/03/16 1605    Visit Number 4   Number of Visits 10   Date for PT Re-Evaluation 08/07/16   PT Start Time 1525   PT Stop Time 1604   PT Time Calculation (min) 39 min   Activity Tolerance Patient tolerated treatment well   Behavior During Therapy [facility] for tasks assessed/performed      Past Medical History  Diagnosis Date  . GERD (gastroesophageal reflux disease)   . Hyperlipidemia   . Hypothyroidism   . Osteoarthritis   . Osteoporosis   . Episodic recurrent vertigo     MRI Head 2003  . Allergy   . Bladder polyps   . Subclavian steal syndrome     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; CTA 2013 revealed open graft  . Hyperplastic colon polyp   . Esophageal spasm   . History of hepatitis     unknown type  . Asymptomatic carotid artery stenosis     R ICA 40% stenosis on CTA 12/2011.  [name] Anemia   . Cataract     BILATERAL-REMOVED  . Elevated blood pressure 03/25/2011    Bp readings borderline today has hx of elvation  In office and ok at home   Not checke recently   She gfeels was elevated from anxiety    . Diverticulosis   . Intestinal metaplasia of gastric mucosa   . Hepatic hemangioma   . CHF (congestive heart failure) [facility]     Past Surgical History  Procedure Laterality Date  . Appendectomy    . Cholecystectomy    . Carotid-subclavian bypass graft Left 1999    for [place] steal syndrome  . Rotator cuff repair Left   . Elbow surgery Left   . Cystectomy Left     hand  . Tubal ligation    . Tonsillectomy    . Colonoscopy    . Tear duct probing  07/2014  . Knee surgery Left 2014  . Tee without  cardioversion N/A 10/03/2015    Procedure: TRANSESOPHAGEAL ECHOCARDIOGRAM (TEE);  Surgeon: Larey Dresser, MD;  Location: [place];  Service: Cardiovascular;  Laterality: N/A;  . Cardiac catheterization N/A 10/04/2015    Procedure: Right/Left Heart Cath and Coronary Angiography;  Surgeon: Belva Crome, MD;  Location: [place] [facility];  Service: Cardiovascular;  Laterality: N/A;  . Mitral valve repair N/A 10/10/2015    Procedure: MITRAL VALVE REPAIR (MVR) WITH SIZE 28 CARPENTIER-EDWARDS PHYSIO II ANNULOPLASTY RING;  Surgeon: [name], MD;  Location: [place];  Service: Open Heart Surgery;  Laterality: N/A;  . Tee without cardioversion N/A 10/10/2015    Procedure: TRANSESOPHAGEAL ECHOCARDIOGRAM (TEE);  Surgeon: [name], MD;  Location: [place];  Service: Open Heart Surgery;  Laterality: N/A;    There were no vitals filed for this visit.      Subjective Assessment - 07/03/16 1528    Subjective I've been working on my exercises at home.     Patient Stated Goals improve stability and safety   Currently in Pain? No/denies            [facility] PT Assessment - 07/03/16 0001  High Level Balance   High Level Balance Comments single leg stance: Rt 4 seconds, Lt 5 seconds                     [facility] Adult PT Treatment/Exercise - 07/03/16 0001    Exercises   Exercises Shoulder   Knee/Hip Exercises: Aerobic   Nustep Level 2 x 8 minutes   seat 7, arms 9   Knee/Hip Exercises: Standing   Heel Raises 2 sets;10 reps   Forward Step Up Both;2 sets;10 reps;Hand Hold: 1   Rocker Board 3 minutes   Rebounder 3 directions x 1 minute each   Walking with Sports Cord 20# forward and reverse x 10 each   Other Standing Knee Exercises toe taps on 6" step: alternaing 2x20 taps without UE support   Other Standing Knee Exercises mini squats 2x10   Shoulder Exercises: Seated   Flexion Strengthening;Both;20 reps;Weights   Flexion Weight (lbs) 1  emphasis on posture     Abduction Strengthening;Both;20 reps;Weights   ABduction Weight (lbs) 1   ABduction Limitations scaption 1# 2x10 bil.                   PT Short Term Goals - 07/03/16 1528    PT SHORT TERM GOAL #1   Title be independent in initial HEP   Status Achieved   PT SHORT TERM GOAL #2   Title perform single leg stance on Rt and Lt x 4-5 seconds to improve stability   Status Achieved   PT SHORT TERM GOAL #4   Title verbalize understanding of correct posture, body mechanics and education regarding osteoprosis   Status Achieved           PT Long Term Goals - 06/12/16 1051    PT LONG TERM GOAL #1   Title be independent in advanced HEP   Time 8   Period Weeks   Status New   PT LONG TERM GOAL #2   Title report a 50% improved confidence with gait in the community   Time 8   Period Weeks   Status New   PT LONG TERM GOAL #3   Title perform 5x sit to stand in < or = to 12 seconds to improve stabiltiy   Time 8   Period Weeks   Status New   PT LONG TERM GOAL #4   Title report no falls x 4 weeks   Time 8   Period Weeks   Status New               Plan - 07/03/16 1529    Clinical Impression Statement Pt with continued balance and strength deficits.  Pt with lapse in treatment and has been performing HEP regularly with ankle weights at home. Single leg stance is 4 seconds on the Rt LE today and 5 on the Lt. Pt demonstrates instability with gait on level surface and limited endurance and strength.  Pt will continue to benefit from skilled PT for endurance, strength and balance training.     Rehab Potential Good   PT Frequency 2x / week   PT Duration 8 weeks   PT Treatment/Interventions ADLs/Self Care Home Management;Cryotherapy;Moist Heat;Therapeutic exercise;Therapeutic activities;Functional mobility training;Stair training;Gait training;Neuromuscular re-education;Patient/family education;Manual techniques;Passive range of motion   PT Next Visit Plan Gait and balance  training.  Postural strength   Consulted and Agree with Plan of Care Patient      Patient will benefit from skilled therapeutic intervention in order  to improve the following deficits and impairments:  Abnormal gait, Decreased range of motion, Difficulty walking, Postural dysfunction, Decreased activity tolerance, Decreased safety awareness  Visit Diagnosis: Muscle weakness (generalized)  Difficulty in walking, not elsewhere classified     Problem List Patient Active Problem List   Diagnosis Date Noted  . CAD (coronary artery disease) 02/22/2016  . Unsteadiness 11/20/2015  . PAF (paroxysmal atrial fibrillation) ([place]) 11/06/2015  . Chronic diastolic CHF (congestive heart failure), NYHA class 2 ([place]) 11/06/2015  . S/P MVR (mitral valve repair) 10/10/2015  . Severe mitral regurgitation   . Shortness of breath   . Acute congestive heart failure ([place])   . Left hip pain   . Mitral regurgitation 10/02/2015  . Paroxysmal atrial fibrillation ([place]) 10/01/2015  . Acute respiratory failure with hypoxia and hypercapnia (HCC)   . Respiratory distress 09/30/2015  . Acute on chronic diastolic congestive heart failure, NYHA class 3 ([place]) 09/30/2015  . Hx of fracture 04/26/2015  . Diplopia 04/26/2015  . Medicare annual wellness visit, subsequent 07/26/2014  . Diarrhea 03/30/2014  . Rhinitis 10/06/2013  . Recurrent vomiting 10/06/2013  . Hx of vomiting 05/26/2013  . Carotid artery disease without cerebral infarction ([place]) 03/22/2013  . Hypertension, isolated systolic 0000000  . Bereavement 03/22/2013  . Fracture, finger 07/13/2012  . Patellar fracture 07/13/2012  . Left-sided chest wall pain 04/10/2012  . Neuritis 04/10/2012  . Constipation, other cause 03/31/2012  . Scapular fracture 03/31/2012  . Hx of fall 03/31/2012  . Esophageal disorder 11/27/2011  . Atypical chest pain 11/27/2011  . Dizziness 11/27/2011  . Postmenopausal HRT (hormone replacement therapy) 09/23/2011  .  Elevated blood pressure 03/25/2011  . Subclavian steal syndrome 09/24/2010  . GAIT IMBALANCE 03/19/2010  . SKIN LESION, UNCERTAIN SIGNIFICANCE 09/18/2009  . SLEEPLESSNESS 03/20/2009  . HYPERGLYCEMIA 03/20/2009  . VITAMIN D DEFICIENCY 11/09/2007  . ALLERGIC RHINITIS 11/09/2007  . LEG PAIN, BILATERAL 11/09/2007  . Hypothyroidism 08/19/2007  . HYPERLIPIDEMIA 08/19/2007  . GERD 08/19/2007  . OSTEOARTHRITIS 08/19/2007  . Osteoporosis 08/19/2007   Sigurd Sos, PT 07/03/2016 4:07 PM  [facility] [address], [place], [place], [address] Phone: [phone]   Fax:  [phone]  Name: Amy Fischer MRN: [id number] Date of Birth: 01/26/1929

## 2016-07-04 ENCOUNTER — Ambulatory Visit (HOSPITAL_COMMUNITY): Payer: Medicare Other

## 2016-07-07 ENCOUNTER — Ambulatory Visit (HOSPITAL_COMMUNITY): Payer: Medicare Other

## 2016-07-08 ENCOUNTER — Ambulatory Visit: Payer: Medicare Other

## 2016-07-08 DIAGNOSIS — R262 Difficulty in walking, not elsewhere classified: Secondary | ICD-10-CM | POA: Diagnosis not present

## 2016-07-08 DIAGNOSIS — M6281 Muscle weakness (generalized): Secondary | ICD-10-CM

## 2016-07-08 NOTE — Therapy (Signed)
[facility] [address] [place], [place], [address] Phone: [phone]   Fax:  [phone]  Physical Therapy Treatment  Patient Details  Name: Amy Fischer MRN: [id number] Date of Birth: 1929-05-26 Referring Provider: Shanon Ace, MD  Encounter Date: 07/08/2016      PT End of Session - 07/08/16 1607    Visit Number 5   Number of Visits 10   Date for PT Re-Evaluation 08/07/16   PT Start Time 1525   PT Stop Time 1607   PT Time Calculation (min) 42 min   Activity Tolerance Patient tolerated treatment well   Behavior During Therapy [facility] for tasks assessed/performed      Past Medical History  Diagnosis Date  . GERD (gastroesophageal reflux disease)   . Hyperlipidemia   . Hypothyroidism   . Osteoarthritis   . Osteoporosis   . Episodic recurrent vertigo     MRI Head 2003  . Allergy   . Bladder polyps   . Subclavian steal syndrome     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; CTA 2013 revealed open graft  . Hyperplastic colon polyp   . Esophageal spasm   . History of hepatitis     unknown type  . Asymptomatic carotid artery stenosis     R ICA 40% stenosis on CTA 12/2011.  [name] Anemia   . Cataract     BILATERAL-REMOVED  . Elevated blood pressure 03/25/2011    Bp readings borderline today has hx of elvation  In office and ok at home   Not checke recently   She gfeels was elevated from anxiety    . Diverticulosis   . Intestinal metaplasia of gastric mucosa   . Hepatic hemangioma   . CHF (congestive heart failure) [facility])     Past Surgical History  Procedure Laterality Date  . Appendectomy    . Cholecystectomy    . Carotid-subclavian bypass graft Left 1999    for [place] steal syndrome  . Rotator cuff repair Left   . Elbow surgery Left   . Cystectomy Left     hand  . Tubal ligation    . Tonsillectomy    . Colonoscopy    . Tear duct probing  07/2014  . Knee surgery Left 2014  . Tee without  cardioversion N/A 10/03/2015    Procedure: TRANSESOPHAGEAL ECHOCARDIOGRAM (TEE);  Surgeon: Larey Dresser, MD;  Location: [place];  Service: Cardiovascular;  Laterality: N/A;  . Cardiac catheterization N/A 10/04/2015    Procedure: Right/Left Heart Cath and Coronary Angiography;  Surgeon: Belva Crome, MD;  Location: [place] CV LAB;  Service: Cardiovascular;  Laterality: N/A;  . Mitral valve repair N/A 10/10/2015    Procedure: MITRAL VALVE REPAIR (MVR) WITH SIZE 28 CARPENTIER-EDWARDS PHYSIO II ANNULOPLASTY RING;  Surgeon: [name], MD;  Location: [place];  Service: Open Heart Surgery;  Laterality: N/A;  . Tee without cardioversion N/A 10/10/2015    Procedure: TRANSESOPHAGEAL ECHOCARDIOGRAM (TEE);  Surgeon: [name], MD;  Location: [place];  Service: Open Heart Surgery;  Laterality: N/A;    There were no vitals filed for this visit.                       [place] Adult PT Treatment/Exercise - 07/08/16 0001    Knee/Hip Exercises: Aerobic   Nustep Level 2 x 10  minutes   seat 7, arms 9   Knee/Hip Exercises: Standing  Heel Raises 2 sets;10 reps   Forward Step Up Both;2 sets;10 reps;Hand Hold: 1   Rocker Board 3 minutes   Rebounder 3 directions x 1 minute each   Walking with Sports Cord 20# forward and reverse x 10 each   Other Standing Knee Exercises toe taps on 6" step: alternaing 2x20 taps without UE support   Other Standing Knee Exercises mini squats 2x10   Knee/Hip Exercises: Seated   Sit to Sand 20 reps;without UE support   Shoulder Exercises: Seated   Flexion Strengthening;Both;20 reps;Weights   Flexion Weight (lbs) 1  emphasis on posture    Abduction Strengthening;Both;20 reps;Weights   ABduction Weight (lbs) 1   ABduction Limitations scaption 1# 2x10 bil.                   PT Short Term Goals - 07/08/16 1540    PT SHORT TERM GOAL #1   Title be independent in initial HEP   Status Achieved   PT SHORT TERM GOAL #2   Title  perform single leg stance on Rt and Lt x 4-5 seconds to improve stability   Status Achieved   PT SHORT TERM GOAL #4   Title verbalize understanding of correct posture, body mechanics and education regarding osteoprosis   Status Achieved           PT Long Term Goals - 06/12/16 1051    PT LONG TERM GOAL #1   Title be independent in advanced HEP   Time 8   Period Weeks   Status New   PT LONG TERM GOAL #2   Title report a 50% improved confidence with gait in the community   Time 8   Period Weeks   Status New   PT LONG TERM GOAL #3   Title perform 5x sit to stand in < or = to 12 seconds to improve stabiltiy   Time 8   Period Weeks   Status New   PT LONG TERM GOAL #4   Title report no falls x 4 weeks   Time 8   Period Weeks   Status New               Plan - 07/08/16 1541    Clinical Impression Statement Pt is independent and compliant with HEP.  Single leg stance was 4 seconds on the Rt LE and 5 on the Lt last week.  Pt demonstates instability with gait on level surface and has limited strength and endurance of a chronic nature.  Pt will continue to benefit from skilled PT for endurance, strength and balance training to improve gait and safety.     Rehab Potential Good   PT Frequency 2x / week   PT Duration 8 weeks   PT Treatment/Interventions ADLs/Self Care Home Management;Cryotherapy;Moist Heat;Therapeutic exercise;Therapeutic activities;Functional mobility training;Stair training;Gait training;Neuromuscular re-education;Patient/family education;Manual techniques;Passive range of motion   PT Next Visit Plan Gait and balance training.  Postural strength   Consulted and Agree with Plan of Care Patient      Patient will benefit from skilled therapeutic intervention in order to improve the following deficits and impairments:  Abnormal gait, Decreased range of motion, Difficulty walking, Postural dysfunction, Decreased activity tolerance, Decreased safety  awareness  Visit Diagnosis: Muscle weakness (generalized)  Difficulty in walking, not elsewhere classified     Problem List Patient Active Problem List   Diagnosis Date Noted  . CAD (coronary artery disease) 02/22/2016  . Unsteadiness 11/20/2015  . PAF (paroxysmal atrial fibrillation) (  [place]) 11/06/2015  . Chronic diastolic CHF (congestive heart failure), NYHA class 2 ([place]) 11/06/2015  . S/P MVR (mitral valve repair) 10/10/2015  . Severe mitral regurgitation   . Shortness of breath   . Acute congestive heart failure ([place])   . Left hip pain   . Mitral regurgitation 10/02/2015  . Paroxysmal atrial fibrillation ([place]) 10/01/2015  . Acute respiratory failure with hypoxia and hypercapnia (H[facility])   . Respiratory distress 09/30/2015  . Acute on chronic diastolic congestive heart failure, NYHA class 3 ([place]) 09/30/2015  . Hx of fracture 04/26/2015  . Diplopia 04/26/2015  . Medicare annual wellness visit, subsequent 07/26/2014  . Diarrhea 03/30/2014  . Rhinitis 10/06/2013  . Recurrent vomiting 10/06/2013  . Hx of vomiting 05/26/2013  . Carotid artery disease without cerebral infarction ([place]) 03/22/2013  . Hypertension, isolated systolic 0000000  . Bereavement 03/22/2013  . Fracture, finger 07/13/2012  . Patellar fracture 07/13/2012  . Left-sided chest wall pain 04/10/2012  . Neuritis 04/10/2012  . Constipation, other cause 03/31/2012  . Scapular fracture 03/31/2012  . Hx of fall 03/31/2012  . Esophageal disorder 11/27/2011  . Atypical chest pain 11/27/2011  . Dizziness 11/27/2011  . Postmenopausal HRT (hormone replacement therapy) 09/23/2011  . Elevated blood pressure 03/25/2011  . Subclavian steal syndrome 09/24/2010  . GAIT IMBALANCE 03/19/2010  . SKIN LESION, UNCERTAIN SIGNIFICANCE 09/18/2009  . SLEEPLESSNESS 03/20/2009  . HYPERGLYCEMIA 03/20/2009  . VITAMIN D DEFICIENCY 11/09/2007  . ALLERGIC RHINITIS 11/09/2007  . LEG PAIN, BILATERAL 11/09/2007  . Hypothyroidism  08/19/2007  . HYPERLIPIDEMIA 08/19/2007  . GERD 08/19/2007  . OSTEOARTHRITIS 08/19/2007  . Osteoporosis 08/19/2007    Sigurd Sos, PT 07/08/2016 4:09 PM  [facility] [address] [place], [place], [address] Phone: [phone]   Fax:  [phone]  Name: Amy Fischer MRN: [id number] Date of Birth: 08/21/1929

## 2016-07-09 ENCOUNTER — Ambulatory Visit (HOSPITAL_COMMUNITY): Payer: Medicare Other

## 2016-07-10 ENCOUNTER — Ambulatory Visit: Payer: Medicare Other

## 2016-07-10 DIAGNOSIS — R262 Difficulty in walking, not elsewhere classified: Secondary | ICD-10-CM | POA: Diagnosis not present

## 2016-07-10 DIAGNOSIS — M6281 Muscle weakness (generalized): Secondary | ICD-10-CM

## 2016-07-10 NOTE — Therapy (Signed)
[facility] [address] [place], [place], [address] Phone: [phone]   Fax:  [phone]  Physical Therapy Treatment  Patient Details  Name: Amy Fischer MRN: [id number] Date of Birth: 10-07-1929 Referring Provider: Shanon Ace, MD  Encounter Date: 07/10/2016      PT End of Session - 07/10/16 1600    Visit Number 6   Number of Visits 10   Date for PT Re-Evaluation 08/07/16   PT Start Time 1528   PT Stop Time 1608   PT Time Calculation (min) 40 min   Activity Tolerance Patient tolerated treatment well   Behavior During Therapy [facility] for tasks assessed/performed      Past Medical History  Diagnosis Date  . GERD (gastroesophageal reflux disease)   . Hyperlipidemia   . Hypothyroidism   . Osteoarthritis   . Osteoporosis   . Episodic recurrent vertigo     MRI Head 2003  . Allergy   . Bladder polyps   . Subclavian steal syndrome     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; CTA 2013 revealed open graft  . Hyperplastic colon polyp   . Esophageal spasm   . History of hepatitis     unknown type  . Asymptomatic carotid artery stenosis     R ICA 40% stenosis on CTA 12/2011.  [name] Anemia   . Cataract     BILATERAL-REMOVED  . Elevated blood pressure 03/25/2011    Bp readings borderline today has hx of elvation  In office and ok at home   Not checke recently   She gfeels was elevated from anxiety    . Diverticulosis   . Intestinal metaplasia of gastric mucosa   . Hepatic hemangioma   . CHF (congestive heart failure) [facility])     Past Surgical History  Procedure Laterality Date  . Appendectomy    . Cholecystectomy    . Carotid-subclavian bypass graft Left 1999    for [place] steal syndrome  . Rotator cuff repair Left   . Elbow surgery Left   . Cystectomy Left     hand  . Tubal ligation    . Tonsillectomy    . Colonoscopy    . Tear duct probing  07/2014  . Knee surgery Left 2014  . Tee without  cardioversion N/A 10/03/2015    Procedure: TRANSESOPHAGEAL ECHOCARDIOGRAM (TEE);  Surgeon: Larey Dresser, MD;  Location: [place];  Service: Cardiovascular;  Laterality: N/A;  . Cardiac catheterization N/A 10/04/2015    Procedure: Right/Left Heart Cath and Coronary Angiography;  Surgeon: Belva Crome, MD;  Location: [place] CV LAB;  Service: Cardiovascular;  Laterality: N/A;  . Mitral valve repair N/A 10/10/2015    Procedure: MITRAL VALVE REPAIR (MVR) WITH SIZE 28 CARPENTIER-EDWARDS PHYSIO II ANNULOPLASTY RING;  Surgeon: [name], MD;  Location: [place];  Service: Open Heart Surgery;  Laterality: N/A;  . Tee without cardioversion N/A 10/10/2015    Procedure: TRANSESOPHAGEAL ECHOCARDIOGRAM (TEE);  Surgeon: [name], MD;  Location: [place];  Service: Open Heart Surgery;  Laterality: N/A;    There were no vitals filed for this visit.      Subjective Assessment - 07/10/16 1531    Subjective Tired from the heat today.     Currently in Pain? No/denies                         [facility] Adult PT Treatment/Exercise - 07/10/16  0001    Lumbar Exercises: Aerobic   UBE (Upper Arm Bike) Level 1 x 6 minutes (3/3)   for postural strength and endurance   Knee/Hip Exercises: Aerobic   Nustep Level 2 x 10  minutes   seat 7, arms 9   Knee/Hip Exercises: Standing   Heel Raises 2 sets;10 reps   Lateral Step Up Both;2 sets;10 reps   Rocker Board 3 minutes   Rebounder 3 directions x 1 minute each   Walking with Sports Cord --   Other Standing Knee Exercises toe taps on 6" step: alternaing 2x20 taps without UE support   Other Standing Knee Exercises mini squats 2x10   Shoulder Exercises: Seated   Flexion Strengthening;Both;20 reps;Weights   Flexion Weight (lbs) 1  emphasis on posture    Abduction Strengthening;Both;20 reps;Weights   ABduction Weight (lbs) 1   ABduction Limitations scaption 1# 2x10 bil.                   PT Short Term Goals -  07/08/16 1540    PT SHORT TERM GOAL #1   Title be independent in initial HEP   Status Achieved   PT SHORT TERM GOAL #2   Title perform single leg stance on Rt and Lt x 4-5 seconds to improve stability   Status Achieved   PT SHORT TERM GOAL #4   Title verbalize understanding of correct posture, body mechanics and education regarding osteoprosis   Status Achieved           PT Long Term Goals - 06/12/16 1051    PT LONG TERM GOAL #1   Title be independent in advanced HEP   Time 8   Period Weeks   Status New   PT LONG TERM GOAL #2   Title report a 50% improved confidence with gait in the community   Time 8   Period Weeks   Status New   PT LONG TERM GOAL #3   Title perform 5x sit to stand in < or = to 12 seconds to improve stabiltiy   Time 8   Period Weeks   Status New   PT LONG TERM GOAL #4   Title report no falls x 4 weeks   Time 8   Period Weeks   Status New               Plan - 07/10/16 1531    Clinical Impression Statement Pt is independent and compliant with HEP.  Gait pattern is improving with wider base of support and reduced instability on level surface.  Pt with chronic balance, strength and endurance deficits and will continue to benefit from skilled PT for stnength, endurance, balance and gait for safety.     Rehab Potential Good   PT Frequency 2x / week   PT Duration 8 weeks   PT Treatment/Interventions ADLs/Self Care Home Management;Cryotherapy;Moist Heat;Therapeutic exercise;Therapeutic activities;Functional mobility training;Stair training;Gait training;Neuromuscular re-education;Patient/family education;Manual techniques;Passive range of motion   PT Next Visit Plan Gait and balance training.  Postural strength   Consulted and Agree with Plan of Care Patient      Patient will benefit from skilled therapeutic intervention in order to improve the following deficits and impairments:  Abnormal gait, Decreased range of motion, Difficulty walking,  Postural dysfunction, Decreased activity tolerance, Decreased safety awareness  Visit Diagnosis: Muscle weakness (generalized)  Difficulty in walking, not elsewhere classified     Problem List Patient Active Problem List   Diagnosis Date Noted  .  CAD (coronary artery disease) 02/22/2016  . Unsteadiness 11/20/2015  . PAF (paroxysmal atrial fibrillation) ([place]) 11/06/2015  . Chronic diastolic CHF (congestive heart failure), NYHA class 2 ([place]) 11/06/2015  . S/P MVR (mitral valve repair) 10/10/2015  . Severe mitral regurgitation   . Shortness of breath   . Acute congestive heart failure ([place])   . Left hip pain   . Mitral regurgitation 10/02/2015  . Paroxysmal atrial fibrillation ([place]) 10/01/2015  . Acute respiratory failure with hypoxia and hypercapnia ([facility])   . Respiratory distress 09/30/2015  . Acute on chronic diastolic congestive heart failure, NYHA class 3 ([place]) 09/30/2015  . Hx of fracture 04/26/2015  . Diplopia 04/26/2015  . Medicare annual wellness visit, subsequent 07/26/2014  . Diarrhea 03/30/2014  . Rhinitis 10/06/2013  . Recurrent vomiting 10/06/2013  . Hx of vomiting 05/26/2013  . Carotid artery disease without cerebral infarction ([place]) 03/22/2013  . Hypertension, isolated systolic 0000000  . Bereavement 03/22/2013  . Fracture, finger 07/13/2012  . Patellar fracture 07/13/2012  . Left-sided chest wall pain 04/10/2012  . Neuritis 04/10/2012  . Constipation, other cause 03/31/2012  . Scapular fracture 03/31/2012  . Hx of fall 03/31/2012  . Esophageal disorder 11/27/2011  . Atypical chest pain 11/27/2011  . Dizziness 11/27/2011  . Postmenopausal HRT (hormone replacement therapy) 09/23/2011  . Elevated blood pressure 03/25/2011  . Subclavian steal syndrome 09/24/2010  . GAIT IMBALANCE 03/19/2010  . SKIN LESION, UNCERTAIN SIGNIFICANCE 09/18/2009  . SLEEPLESSNESS 03/20/2009  . HYPERGLYCEMIA 03/20/2009  . VITAMIN D DEFICIENCY 11/09/2007  . ALLERGIC RHINITIS  11/09/2007  . LEG PAIN, BILATERAL 11/09/2007  . Hypothyroidism 08/19/2007  . HYPERLIPIDEMIA 08/19/2007  . GERD 08/19/2007  . OSTEOARTHRITIS 08/19/2007  . Osteoporosis 08/19/2007     Sigurd Sos, PT 07/10/2016 4:01 PM   [facility] [address] [place], [place], [address] Phone: [phone]   Fax:  [phone]  Name: ZEYA VICENTI MRN: [id number] Date of Birth: [birth year]

## 2016-07-11 ENCOUNTER — Ambulatory Visit (HOSPITAL_COMMUNITY): Payer: Medicare Other

## 2016-07-14 ENCOUNTER — Ambulatory Visit (HOSPITAL_COMMUNITY): Payer: Medicare Other

## 2016-07-14 ENCOUNTER — Telehealth: Payer: Self-pay | Admitting: Internal Medicine

## 2016-07-14 NOTE — Telephone Encounter (Signed)
Pt need new Rx for ipratropium 0.03% nasal spray       Pharm:  [facility]

## 2016-07-15 ENCOUNTER — Ambulatory Visit: Payer: Medicare Other

## 2016-07-15 DIAGNOSIS — R262 Difficulty in walking, not elsewhere classified: Secondary | ICD-10-CM

## 2016-07-15 DIAGNOSIS — M6281 Muscle weakness (generalized): Secondary | ICD-10-CM | POA: Diagnosis not present

## 2016-07-15 MED ORDER — IPRATROPIUM BROMIDE 0.03 % NA SOLN: 2.0000 | Freq: Three times a day (TID) | NASAL | Status: DC | PRN

## 2016-07-15 NOTE — Therapy (Signed)
[facility] [address] [place], [place], [address] Phone: [phone]   Fax:  [phone]  Physical Therapy Treatment  Patient Details  Name: Amy Fischer MRN: [id number] Date of Birth: 1929/09/19 Referring Provider: Shanon Ace, MD  Encounter Date: 07/15/2016      PT End of Session - 07/15/16 1601    Visit Number 7   Number of Visits 10   Date for PT Re-Evaluation 08/07/16   PT Start Time [id number]   PT Stop Time 1611   PT Time Calculation (min) 41 min   Activity Tolerance Patient tolerated treatment well   Behavior During Therapy [facility] for tasks assessed/performed      Past Medical History  Diagnosis Date  . GERD (gastroesophageal reflux disease)   . Hyperlipidemia   . Hypothyroidism   . Osteoarthritis   . Osteoporosis   . Episodic recurrent vertigo     MRI Head 2003  . Allergy   . Bladder polyps   . Subclavian steal syndrome     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; CTA 2013 revealed open graft  . Hyperplastic colon polyp   . Esophageal spasm   . History of hepatitis     unknown type  . Asymptomatic carotid artery stenosis     R ICA 40% stenosis on CTA 12/2011.  [name] Anemia   . Cataract     BILATERAL-REMOVED  . Elevated blood pressure 03/25/2011    Bp readings borderline today has hx of elvation  In office and ok at home   Not checke recently   She gfeels was elevated from anxiety    . Diverticulosis   . Intestinal metaplasia of gastric mucosa   . Hepatic hemangioma   . CHF (congestive heart failure) [facility])     Past Surgical History  Procedure Laterality Date  . Appendectomy    . Cholecystectomy    . Carotid-subclavian bypass graft Left 1999    for [place] steal syndrome  . Rotator cuff repair Left   . Elbow surgery Left   . Cystectomy Left     hand  . Tubal ligation    . Tonsillectomy    . Colonoscopy    . Tear duct probing  07/2014  . Knee surgery Left 2014  . Tee without  cardioversion N/A 10/03/2015    Procedure: TRANSESOPHAGEAL ECHOCARDIOGRAM (TEE);  Surgeon: Larey Dresser, MD;  Location: [place];  Service: Cardiovascular;  Laterality: N/A;  . Cardiac catheterization N/A 10/04/2015    Procedure: Right/Left Heart Cath and Coronary Angiography;  Surgeon: Belva Crome, MD;  Location: [place] [facility];  Service: Cardiovascular;  Laterality: N/A;  . Mitral valve repair N/A 10/10/2015    Procedure: MITRAL VALVE REPAIR (MVR) WITH SIZE 28 CARPENTIER-EDWARDS PHYSIO II ANNULOPLASTY RING;  Surgeon: [name], MD;  Location: [place];  Service: Open Heart Surgery;  Laterality: N/A;  . Tee without cardioversion N/A 10/10/2015    Procedure: TRANSESOPHAGEAL ECHOCARDIOGRAM (TEE);  Surgeon: [name], MD;  Location: [place];  Service: Open Heart Surgery;  Laterality: N/A;    There were no vitals filed for this visit.      Subjective Assessment - 07/15/16 1535    Subjective Doing exercises at home.     Currently in Pain? No/denies            [facility] PT Assessment - 07/15/16 0001    Assessment   Medical Diagnosis Hx of fall, unsteadiness  Timed Up and Go Test   TUG Normal TUG   Normal TUG (seconds) 11                     [facility] Adult PT Treatment/Exercise - 07/15/16 0001    High Level Balance   High Level Balance Activities Marching forwards;Tandem walking;Side stepping   High Level Balance Comments 2x25 feet each using gait belt   Lumbar Exercises: Aerobic   UBE (Upper Arm Bike) Level 1 x 6 minutes (3/3)   for postural strength and endurance   Knee/Hip Exercises: Aerobic   Nustep Level 2 x 10  minutes   seat 7, arms 9   Knee/Hip Exercises: Standing   Heel Raises 2 sets;10 reps   Lateral Step Up Both;2 sets;10 reps   Rocker Board 3 minutes   Rebounder 3 directions x 1 minute each   Walking with Sports Cord --   Other Standing Knee Exercises toe taps on 6" step: alternaing 2x20 taps without UE support   Other Standing  Knee Exercises mini squats 2x10   Shoulder Exercises: Seated   Flexion Strengthening;Both;20 reps;Weights   Flexion Weight (lbs) 1  emphasis on posture    Abduction Strengthening;Both;20 reps;Weights   ABduction Weight (lbs) 1   ABduction Limitations scaption 1# 2x10 bil.                   PT Short Term Goals - 07/15/16 1534    PT SHORT TERM GOAL #1   Title be independent in initial HEP   Status Achieved   PT SHORT TERM GOAL #2   Title perform single leg stance on Rt and Lt x 4-5 seconds to improve stability   Status Achieved   PT SHORT TERM GOAL #3   Title perform TUG in < or = to 12 seconds to improve stability   Status Achieved   PT SHORT TERM GOAL #4   Title verbalize understanding of correct posture, body mechanics and education regarding osteoprosis   Status Achieved           PT Long Term Goals - 06/12/16 1051    PT LONG TERM GOAL #1   Title be independent in advanced HEP   Time 8   Period Weeks   Status New   PT LONG TERM GOAL #2   Title report a 50% improved confidence with gait in the community   Time 8   Period Weeks   Status New   PT LONG TERM GOAL #3   Title perform 5x sit to stand in < or = to 12 seconds to improve stabiltiy   Time 8   Period Weeks   Status New   PT LONG TERM GOAL #4   Title report no falls x 4 weeks   Time 8   Period Weeks   Status New               Plan - 07/15/16 1547    Clinical Impression Statement Pt is independent and compliant with HEP.  Gait pattern is improving with wider base support and reduced instability on level surface.  TUG is improved to 11 seconds today.  Pt with chronic balance, strength and endurance deficits and will continue to benefit from skilled Pt for strength, enduranc and gait for safety.     Rehab Potential Good   PT Frequency 2x / week   PT Duration 8 weeks   PT Treatment/Interventions ADLs/Self Care Home Management;Cryotherapy;Moist Heat;Therapeutic exercise;Therapeutic  activities;Functional  mobility training;Stair training;Gait training;Neuromuscular re-education;Patient/family education;Manual techniques;Passive range of motion   PT Next Visit Plan Gait and balance training.  Postural strength   Consulted and Agree with Plan of Care Patient      Patient will benefit from skilled therapeutic intervention in order to improve the following deficits and impairments:  Abnormal gait, Decreased range of motion, Difficulty walking, Postural dysfunction, Decreased activity tolerance, Decreased safety awareness  Visit Diagnosis: Muscle weakness (generalized)  Difficulty in walking, not elsewhere classified     Problem List Patient Active Problem List   Diagnosis Date Noted  . CAD (coronary artery disease) 02/22/2016  . Unsteadiness 11/20/2015  . PAF (paroxysmal atrial fibrillation) ([place]) 11/06/2015  . Chronic diastolic CHF (congestive heart failure), NYHA class 2 ([place]) 11/06/2015  . S/P MVR (mitral valve repair) 10/10/2015  . Severe mitral regurgitation   . Shortness of breath   . Acute congestive heart failure ([place])   . Left hip pain   . Mitral regurgitation 10/02/2015  . Paroxysmal atrial fibrillation ([place]) 10/01/2015  . Acute respiratory failure with hypoxia and hypercapnia (HCC)   . Respiratory distress 09/30/2015  . Acute on chronic diastolic congestive heart failure, NYHA class 3 ([place]) 09/30/2015  . Hx of fracture 04/26/2015  . Diplopia 04/26/2015  . Medicare annual wellness visit, subsequent 07/26/2014  . Diarrhea 03/30/2014  . Rhinitis 10/06/2013  . Recurrent vomiting 10/06/2013  . Hx of vomiting 05/26/2013  . Carotid artery disease without cerebral infarction ([place]) 03/22/2013  . Hypertension, isolated systolic 0000000  . Bereavement 03/22/2013  . Fracture, finger 07/13/2012  . Patellar fracture 07/13/2012  . Left-sided chest wall pain 04/10/2012  . Neuritis 04/10/2012  . Constipation, other cause 03/31/2012  . Scapular fracture  03/31/2012  . Hx of fall 03/31/2012  . Esophageal disorder 11/27/2011  . Atypical chest pain 11/27/2011  . Dizziness 11/27/2011  . Postmenopausal HRT (hormone replacement therapy) 09/23/2011  . Elevated blood pressure 03/25/2011  . Subclavian steal syndrome 09/24/2010  . GAIT IMBALANCE 03/19/2010  . SKIN LESION, UNCERTAIN SIGNIFICANCE 09/18/2009  . SLEEPLESSNESS 03/20/2009  . HYPERGLYCEMIA 03/20/2009  . VITAMIN D DEFICIENCY 11/09/2007  . ALLERGIC RHINITIS 11/09/2007  . LEG PAIN, BILATERAL 11/09/2007  . Hypothyroidism 08/19/2007  . HYPERLIPIDEMIA 08/19/2007  . GERD 08/19/2007  . OSTEOARTHRITIS 08/19/2007  . Osteoporosis 08/19/2007     Sigurd Sos, PT 07/15/2016 4:02 PM  [facility] [address] [place], [place], [address] Phone: [phone]   Fax:  [phone]  Name: Amy Fischer MRN: [id number] Date of Birth: 01-16-1929

## 2016-07-15 NOTE — Telephone Encounter (Signed)
Sent to the pharmacy by e-scribe. 

## 2016-07-16 ENCOUNTER — Ambulatory Visit (HOSPITAL_COMMUNITY): Payer: Medicare Other

## 2016-07-17 ENCOUNTER — Encounter: Payer: Self-pay | Admitting: Physical Therapy

## 2016-07-17 ENCOUNTER — Ambulatory Visit: Payer: Medicare Other | Admitting: Physical Therapy

## 2016-07-17 DIAGNOSIS — R262 Difficulty in walking, not elsewhere classified: Secondary | ICD-10-CM | POA: Diagnosis not present

## 2016-07-17 DIAGNOSIS — M6281 Muscle weakness (generalized): Secondary | ICD-10-CM

## 2016-07-17 NOTE — Therapy (Signed)
[facility] [address] [place], [place], [address] Phone: [phone]   Fax:  [phone]  Physical Therapy Treatment  Patient Details  Name: Amy Fischer MRN: [id number] Date of Birth: Jan 13, 1929 Referring Provider: Shanon Ace, MD  Encounter Date: 07/17/2016      PT End of Session - 07/17/16 1602    Visit Number 8   Number of Visits 10   Date for PT Re-Evaluation 08/07/16   PT Start Time [id number]   PT Stop Time 1610   PT Time Calculation (min) 40 min   Activity Tolerance Patient tolerated treatment well   Behavior During Therapy [facility] for tasks assessed/performed      Past Medical History  Diagnosis Date  . GERD (gastroesophageal reflux disease)   . Hyperlipidemia   . Hypothyroidism   . Osteoarthritis   . Osteoporosis   . Episodic recurrent vertigo     MRI Head 2003  . Allergy   . Bladder polyps   . Subclavian steal syndrome     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; CTA 2013 revealed open graft  . Hyperplastic colon polyp   . Esophageal spasm   . History of hepatitis     unknown type  . Asymptomatic carotid artery stenosis     R ICA 40% stenosis on CTA 12/2011.  [name] Anemia   . Cataract     BILATERAL-REMOVED  . Elevated blood pressure 03/25/2011    Bp readings borderline today has hx of elvation  In office and ok at home   Not checke recently   She gfeels was elevated from anxiety    . Diverticulosis   . Intestinal metaplasia of gastric mucosa   . Hepatic hemangioma   . CHF (congestive heart failure) [facility])     Past Surgical History  Procedure Laterality Date  . Appendectomy    . Cholecystectomy    . Carotid-subclavian bypass graft Left 1999    for [place] steal syndrome  . Rotator cuff repair Left   . Elbow surgery Left   . Cystectomy Left     hand  . Tubal ligation    . Tonsillectomy    . Colonoscopy    . Tear duct probing  07/2014  . Knee surgery Left 2014  . Tee without  cardioversion N/A 10/03/2015    Procedure: TRANSESOPHAGEAL ECHOCARDIOGRAM (TEE);  Surgeon: Larey Dresser, MD;  Location: [place];  Service: Cardiovascular;  Laterality: N/A;  . Cardiac catheterization N/A 10/04/2015    Procedure: Right/Left Heart Cath and Coronary Angiography;  Surgeon: Belva Crome, MD;  Location: [place] [facility];  Service: Cardiovascular;  Laterality: N/A;  . Mitral valve repair N/A 10/10/2015    Procedure: MITRAL VALVE REPAIR (MVR) WITH SIZE 28 CARPENTIER-EDWARDS PHYSIO II ANNULOPLASTY RING;  Surgeon: [name], MD;  Location: [place];  Service: Open Heart Surgery;  Laterality: N/A;  . Tee without cardioversion N/A 10/10/2015    Procedure: TRANSESOPHAGEAL ECHOCARDIOGRAM (TEE);  Surgeon: [name], MD;  Location: [place];  Service: Open Heart Surgery;  Laterality: N/A;    There were no vitals filed for this visit.      Subjective Assessment - 07/17/16 1535    Subjective I have not fallen in 2 months. My fear of falling has decreased a little bit.    Pertinent History neuropathy in bil. feet, osteoporosis   How long can you walk comfortably? limited by fear of falling   Patient Stated  Goals improve stability and safety   Currently in Pain? No/denies                         [facility] Adult PT Treatment/Exercise - 07/17/16 0001    Lumbar Exercises: Aerobic   UBE (Upper Arm Bike) Level 1 x 6 minutes (3/3)   for postural strength and endurance   Lumbar Exercises: Standing   Other Standing Lumbar Exercises pulleys 15# forward 10x, backwards 10x walking   Knee/Hip Exercises: Aerobic   Nustep Level 2 x 10  minutes   seat 7, arms 9   Knee/Hip Exercises: Standing   Heel Raises 2 sets;10 reps   Lateral Step Up Both;2 sets;10 reps   Rocker Board 3 minutes   Rebounder 3 directions x 1 minute each   Other Standing Knee Exercises toe taps on 6" step: alternaing 2x20 taps without UE support   Other Standing Knee Exercises mini squats  2x10   Shoulder Exercises: Seated   Flexion Strengthening;Both;20 reps;Weights   Flexion Weight (lbs) 1  emphasis on posture    Abduction Strengthening;Both;20 reps;Weights   ABduction Weight (lbs) 1   ABduction Limitations scaption 1# 2x10 bil.                   PT Short Term Goals - 07/15/16 1534    PT SHORT TERM GOAL #1   Title be independent in initial HEP   Status Achieved   PT SHORT TERM GOAL #2   Title perform single leg stance on Rt and Lt x 4-5 seconds to improve stability   Status Achieved   PT SHORT TERM GOAL #3   Title perform TUG in < or = to 12 seconds to improve stability   Status Achieved   PT SHORT TERM GOAL #4   Title verbalize understanding of correct posture, body mechanics and education regarding osteoprosis   Status Achieved           PT Long Term Goals - 06/12/16 1051    PT LONG TERM GOAL #1   Title be independent in advanced HEP   Time 8   Period Weeks   Status New   PT LONG TERM GOAL #2   Title report a 50% improved confidence with gait in the community   Time 8   Period Weeks   Status New   PT LONG TERM GOAL #3   Title perform 5x sit to stand in < or = to 12 seconds to improve stabiltiy   Time 8   Period Weeks   Status New   PT LONG TERM GOAL #4   Title report no falls x 4 weeks   Time 8   Period Weeks   Status New               Plan - 07/17/16 1603    Clinical Impression Statement Patient was able to do exercises correctly. Patient did not rest due to increased endurance.  Patient able to walk against resistance with some difficulty. Patient has to look at her feet when walking due to being nervous about falling. Patient will benefit from skilled therapy to improve balance and strength.    Rehab Potential Good   Clinical Impairments Affecting Rehab Potential None   PT Frequency 2x / week   PT Duration 8 weeks   PT Treatment/Interventions ADLs/Self Care Home Management;Cryotherapy;Moist Heat;Therapeutic  exercise;Therapeutic activities;Functional mobility training;Stair training;Gait training;Neuromuscular re-education;Patient/family education;Manual techniques;Passive range of motion   PT  Next Visit Plan Gait and balance training.  Postural strength   PT Home Exercise Plan progress as needed   Recommended Other Services None   Consulted and Agree with Plan of Care Patient      Patient will benefit from skilled therapeutic intervention in order to improve the following deficits and impairments:  Abnormal gait, Decreased range of motion, Difficulty walking, Postural dysfunction, Decreased activity tolerance, Decreased safety awareness  Visit Diagnosis: Muscle weakness (generalized)  Difficulty in walking, not elsewhere classified     Problem List Patient Active Problem List   Diagnosis Date Noted  . CAD (coronary artery disease) 02/22/2016  . Unsteadiness 11/20/2015  . PAF (paroxysmal atrial fibrillation) ([place]) 11/06/2015  . Chronic diastolic CHF (congestive heart failure), NYHA class 2 ([place]) 11/06/2015  . S/P MVR (mitral valve repair) 10/10/2015  . Severe mitral regurgitation   . Shortness of breath   . Acute congestive heart failure ([place])   . Left hip pain   . Mitral regurgitation 10/02/2015  . Paroxysmal atrial fibrillation ([place]) 10/01/2015  . Acute respiratory failure with hypoxia and hypercapnia (HCC)   . Respiratory distress 09/30/2015  . Acute on chronic diastolic congestive heart failure, NYHA class 3 ([place]) 09/30/2015  . Hx of fracture 04/26/2015  . Diplopia 04/26/2015  . Medicare annual wellness visit, subsequent 07/26/2014  . Diarrhea 03/30/2014  . Rhinitis 10/06/2013  . Recurrent vomiting 10/06/2013  . Hx of vomiting 05/26/2013  . Carotid artery disease without cerebral infarction ([place]) 03/22/2013  . Hypertension, isolated systolic 0000000  . Bereavement 03/22/2013  . Fracture, finger 07/13/2012  . Patellar fracture 07/13/2012  . Left-sided chest wall pain  04/10/2012  . Neuritis 04/10/2012  . Constipation, other cause 03/31/2012  . Scapular fracture 03/31/2012  . Hx of fall 03/31/2012  . Esophageal disorder 11/27/2011  . Atypical chest pain 11/27/2011  . Dizziness 11/27/2011  . Postmenopausal HRT (hormone replacement therapy) 09/23/2011  . Elevated blood pressure 03/25/2011  . Subclavian steal syndrome 09/24/2010  . GAIT IMBALANCE 03/19/2010  . SKIN LESION, UNCERTAIN SIGNIFICANCE 09/18/2009  . SLEEPLESSNESS 03/20/2009  . HYPERGLYCEMIA 03/20/2009  . VITAMIN D DEFICIENCY 11/09/2007  . ALLERGIC RHINITIS 11/09/2007  . LEG PAIN, BILATERAL 11/09/2007  . Hypothyroidism 08/19/2007  . HYPERLIPIDEMIA 08/19/2007  . GERD 08/19/2007  . OSTEOARTHRITIS 08/19/2007  . Osteoporosis 08/19/2007    Earlie Counts, PT 07/17/2016 4:25 PM   [facility] [address], [place], [place], [address] Phone: [phone]   Fax:  [phone]  Name: Amy Fischer MRN: [id number] Date of Birth: 1929/10/01

## 2016-07-18 ENCOUNTER — Other Ambulatory Visit: Payer: Self-pay

## 2016-07-18 ENCOUNTER — Ambulatory Visit (HOSPITAL_COMMUNITY): Payer: Medicare Other

## 2016-07-18 NOTE — Patient Outreach (Signed)
[facility]) Care Management  07/18/2016  PACE [employer]MA Sep 20, 1929 [id number]  Telephone call to patient for monthly call.  Patient reports that she has someone at her house right now and cannot talk. Advised health coach would call another time.  Plan: RN Health Coach will attempt patient again in the month of July.  Jone Baseman, RN, MSN [place] [phone]

## 2016-07-21 ENCOUNTER — Ambulatory Visit (HOSPITAL_COMMUNITY): Payer: Medicare Other

## 2016-07-22 ENCOUNTER — Ambulatory Visit: Payer: Medicare Other

## 2016-07-22 DIAGNOSIS — R262 Difficulty in walking, not elsewhere classified: Secondary | ICD-10-CM

## 2016-07-22 DIAGNOSIS — M6281 Muscle weakness (generalized): Secondary | ICD-10-CM | POA: Diagnosis not present

## 2016-07-22 NOTE — Patient Instructions (Addendum)
Single Leg - Eyes Open  Holding support, lift right leg while maintaining balance over other leg. Progress to removing hands from support surface for longer periods of time. Hold___10_ seconds. Repeat _3-5___ times per session. Do _2 ___ sessions per day.         Side-Stepping- do this at the counter top   Walk to left side with eyes open. Take even steps, leading with same foot. Repeat in other direction. Across the counter 3-4 times.  2x/day   [facility] [address], [place], [place] [address] Phone # [phone] Fax [phone]

## 2016-07-22 NOTE — Therapy (Signed)
[facility] [address] [place], [place], [address] Phone: [phone]   Fax:  [phone]  Physical Therapy Treatment  Patient Details  Name: Amy Fischer MRN: [id number] Date of Birth: [birth year] Referring Provider: Shanon Ace, MD  Encounter Date: 07/22/2016      PT End of Session - 07/22/16 1557    Visit Number 9   Number of Visits 10   Date for PT Re-Evaluation 08/07/16   PT Start Time [id number]   PT Stop Time 1608   PT Time Calculation (min) 45 min   Activity Tolerance Patient tolerated treatment well   Behavior During Therapy [facility] for tasks assessed/performed      Past Medical History:  Diagnosis Date  . Allergy   . Anemia   . Asymptomatic carotid artery stenosis    R ICA 40% stenosis on CTA 12/2011.  . Bladder polyps   . Cataract    BILATERAL-REMOVED  . CHF (congestive heart failure) ([place])   . Diverticulosis   . Elevated blood pressure 03/25/2011   Bp readings borderline today has hx of elvation  In office and ok at home   Not checke recently   She gfeels was elevated from anxiety    . Episodic recurrent vertigo    MRI Head 2003  . Esophageal spasm   . GERD (gastroesophageal reflux disease)   . Hepatic hemangioma   . History of hepatitis    unknown type  . Hyperlipidemia   . Hyperplastic colon polyp   . Hypothyroidism   . Intestinal metaplasia of gastric mucosa   . Osteoarthritis   . Osteoporosis   . Subclavian steal syndrome    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; CTA 2013 revealed open graft    Past Surgical History:  Procedure Laterality Date  . APPENDECTOMY    . CARDIAC CATHETERIZATION N/A 10/04/2015   Procedure: Right/Left Heart Cath and Coronary Angiography;  Surgeon: Belva Crome, MD;  Location: [place] [facility];  Service: Cardiovascular;  Laterality: N/A;  . CAROTID-SUBCLAVIAN BYPASS GRAFT Left 1999   for [place] steal syndrome  . CHOLECYSTECTOMY    . COLONOSCOPY    .  CYSTECTOMY Left    hand  . ELBOW SURGERY Left   . KNEE SURGERY Left 2014  . MITRAL VALVE REPAIR N/A 10/10/2015   Procedure: MITRAL VALVE REPAIR (MVR) WITH SIZE 28 CARPENTIER-EDWARDS PHYSIO II ANNULOPLASTY RING;  Surgeon: [name], MD;  Location: [place];  Service: Open Heart Surgery;  Laterality: N/A;  . ROTATOR CUFF REPAIR Left   . TEAR DUCT PROBING  07/2014  . TEE WITHOUT CARDIOVERSION N/A 10/03/2015   Procedure: TRANSESOPHAGEAL ECHOCARDIOGRAM (TEE);  Surgeon: Larey Dresser, MD;  Location: [place];  Service: Cardiovascular;  Laterality: N/A;  . TEE WITHOUT CARDIOVERSION N/A 10/10/2015   Procedure: TRANSESOPHAGEAL ECHOCARDIOGRAM (TEE);  Surgeon: [name], MD;  Location: [place];  Service: Open Heart Surgery;  Laterality: N/A;  . TONSILLECTOMY    . TUBAL LIGATION      There were no vitals filed for this visit.      Subjective Assessment - 07/22/16 1538    Subjective No pain today.  Fear of falling has reduced.     Currently in Pain? No/denies            [facility] PT Assessment - 07/22/16 0001      Standardized Balance Assessment   Five times sit to stand comments  10 seconds  [place] Adult PT Treatment/Exercise - 07/22/16 0001      High Level Balance   High Level Balance Activities Side stepping   High Level Balance Comments 2x25 minutes     Lumbar Exercises: Aerobic   UBE (Upper Arm Bike) Level 1 x 6 minutes (3/3)   for postural strength and endurance     Knee/Hip Exercises: Aerobic   Nustep Level 2 x 10  minutes   seat 7, arms 9     Knee/Hip Exercises: Standing   Heel Raises 2 sets;10 reps   Lateral Step Up Both;2 sets;10 reps   Rocker Board 3 minutes   Rebounder 3 directions x 1 minute each   Gait Training singe leg stance: Rt 11 seconds, Lt 6 seconds   Other Standing Knee Exercises toe taps on 6" step: alternaing 2x20 taps without UE support   Other Standing Knee Exercises mini squats 2x10                 PT Education - 07/22/16 1545    Education provided Yes   Education Details single leg stance and sidestepping at [employer]) Educated Patient          PT Short Term Goals - 07/22/16 1553      PT SHORT TERM GOAL #2   Title perform single leg stance on Rt and Lt x 4-5 seconds to improve stability   Status Achieved           PT Long Term Goals - 07/22/16 1539      PT LONG TERM GOAL #1   Title be independent in advanced HEP   Time 8   Period Weeks   Status On-going     PT LONG TERM GOAL #2   Title report a 50% improved confidence with gait in the community   Time 8   Period Weeks   Status On-going     PT LONG TERM GOAL #3   Title perform 5x sit to stand in < or = to 12 seconds to improve stabiltiy   Status Achieved     PT LONG TERM GOAL #4   Title report no falls x 4 weeks   Time 8   Period Weeks   Status On-going               Plan - 07/22/16 1553    Clinical Impression Statement Single leg stance was improved today to 11 seconds on the Rt and 6 seconds on the Lt.  5x sit to stand was 10 seconds.  Pt reports minimal improved confidence in the community.  No falls reported. Pt will continued to benefit from skilled PT for strength, balance, proprioception and endurance activity progression.     Rehab Potential Good   PT Frequency 2x / week   PT Duration 8 weeks   PT Treatment/Interventions ADLs/Self Care Home Management;Cryotherapy;Moist Heat;Therapeutic exercise;Therapeutic activities;Functional mobility training;Stair training;Gait training;Neuromuscular re-education;Patient/family education;Manual techniques;Passive range of motion   PT Next Visit Plan Gait and balance training.  Postural strength   Consulted and Agree with Plan of Care Patient      Patient will benefit from skilled therapeutic intervention in order to improve the following deficits and impairments:  Abnormal gait, Decreased range of motion, Difficulty  walking, Postural dysfunction, Decreased activity tolerance, Decreased safety awareness  Visit Diagnosis: Muscle weakness (generalized)  Difficulty in walking, not elsewhere classified     Problem List Patient Active Problem List   Diagnosis Date Noted  . CAD (  coronary artery disease) 02/22/2016  . Unsteadiness 11/20/2015  . PAF (paroxysmal atrial fibrillation) ([place]) 11/06/2015  . Chronic diastolic CHF (congestive heart failure), NYHA class 2 ([place]) 11/06/2015  . S/P MVR (mitral valve repair) 10/10/2015  . Severe mitral regurgitation   . Shortness of breath   . Acute congestive heart failure ([place])   . Left hip pain   . Mitral regurgitation 10/02/2015  . Paroxysmal atrial fibrillation ([place]) 10/01/2015  . Acute respiratory failure with hypoxia and hypercapnia (HCC)   . Respiratory distress 09/30/2015  . Acute on chronic diastolic congestive heart failure, NYHA class 3 ([place]) 09/30/2015  . Hx of fracture 04/26/2015  . Diplopia 04/26/2015  . Medicare annual wellness visit, subsequent 07/26/2014  . Diarrhea 03/30/2014  . Rhinitis 10/06/2013  . Recurrent vomiting 10/06/2013  . Hx of vomiting 05/26/2013  . Carotid artery disease without cerebral infarction ([place]) 03/22/2013  . Hypertension, isolated systolic 0000000  . Bereavement 03/22/2013  . Fracture, finger 07/13/2012  . Patellar fracture 07/13/2012  . Left-sided chest wall pain 04/10/2012  . Neuritis 04/10/2012  . Constipation, other cause 03/31/2012  . Scapular fracture 03/31/2012  . Hx of fall 03/31/2012  . Esophageal disorder 11/27/2011  . Atypical chest pain 11/27/2011  . Dizziness 11/27/2011  . Postmenopausal HRT (hormone replacement therapy) 09/23/2011  . Elevated blood pressure 03/25/2011  . Subclavian steal syndrome 09/24/2010  . GAIT IMBALANCE 03/19/2010  . SKIN LESION, UNCERTAIN SIGNIFICANCE 09/18/2009  . SLEEPLESSNESS 03/20/2009  . HYPERGLYCEMIA 03/20/2009  . VITAMIN D DEFICIENCY 11/09/2007  . ALLERGIC  RHINITIS 11/09/2007  . LEG PAIN, BILATERAL 11/09/2007  . Hypothyroidism 08/19/2007  . HYPERLIPIDEMIA 08/19/2007  . GERD 08/19/2007  . OSTEOARTHRITIS 08/19/2007  . Osteoporosis 08/19/2007     Sigurd Sos, PT 07/22/16 4:03 PM  [facility] [address], [place], [place], [address] Phone: [phone]   Fax:  [phone]  Name: Amy Fischer MRN: [id number] Date of Birth: 08-05-1929

## 2016-07-23 ENCOUNTER — Ambulatory Visit (HOSPITAL_COMMUNITY): Payer: Medicare Other

## 2016-07-24 ENCOUNTER — Ambulatory Visit: Payer: Medicare Other

## 2016-07-24 DIAGNOSIS — R262 Difficulty in walking, not elsewhere classified: Secondary | ICD-10-CM

## 2016-07-24 DIAGNOSIS — M6281 Muscle weakness (generalized): Secondary | ICD-10-CM

## 2016-07-24 NOTE — Therapy (Signed)
[facility] [address], [place], [place], [address] Phone: [phone]   Fax:  [phone]  Physical Therapy Treatment  Patient Details  Name: Amy Fischer MRN: [id number] Date of Birth: March 01, 1929 Referring Provider: Shanon Ace, MD  Encounter Date: 07/24/2016      PT End of Session - 07/24/16 1558    Visit Number 10   Number of Visits 20   Date for PT Re-Evaluation 08/07/16   PT Start Time 1525   PT Stop Time 1605   PT Time Calculation (min) 40 min   Activity Tolerance Patient tolerated treatment well   Behavior During Therapy [facility] for tasks assessed/performed      Past Medical History:  Diagnosis Date  . Allergy   . Anemia   . Asymptomatic carotid artery stenosis    R ICA 40% stenosis on CTA 12/2011.  . Bladder polyps   . Cataract    BILATERAL-REMOVED  . CHF (congestive heart failure) ([place])   . Diverticulosis   . Elevated blood pressure 03/25/2011   Bp readings borderline today has hx of elvation  In office and ok at home   Not checke recently   She gfeels was elevated from anxiety    . Episodic recurrent vertigo    MRI Head 2003  . Esophageal spasm   . GERD (gastroesophageal reflux disease)   . Hepatic hemangioma   . History of hepatitis    unknown type  . Hyperlipidemia   . Hyperplastic colon polyp   . Hypothyroidism   . Intestinal metaplasia of gastric mucosa   . Osteoarthritis   . Osteoporosis   . Subclavian steal syndrome    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; CTA 2013 revealed open graft    Past Surgical History:  Procedure Laterality Date  . APPENDECTOMY    . CARDIAC CATHETERIZATION N/A 10/04/2015   Procedure: Right/Left Heart Cath and Coronary Angiography;  Surgeon: Belva Crome, MD;  Location: [place] [facility];  Service: Cardiovascular;  Laterality: N/A;  . CAROTID-SUBCLAVIAN BYPASS GRAFT Left 1999   for [place] steal syndrome  . CHOLECYSTECTOMY    . COLONOSCOPY    .  CYSTECTOMY Left    hand  . ELBOW SURGERY Left   . KNEE SURGERY Left 2014  . MITRAL VALVE REPAIR N/A 10/10/2015   Procedure: MITRAL VALVE REPAIR (MVR) WITH SIZE 28 CARPENTIER-EDWARDS PHYSIO II ANNULOPLASTY RING;  Surgeon: [name], MD;  Location: [place];  Service: Open Heart Surgery;  Laterality: N/A;  . ROTATOR CUFF REPAIR Left   . TEAR DUCT PROBING  07/2014  . TEE WITHOUT CARDIOVERSION N/A 10/03/2015   Procedure: TRANSESOPHAGEAL ECHOCARDIOGRAM (TEE);  Surgeon: Larey Dresser, MD;  Location: [place];  Service: Cardiovascular;  Laterality: N/A;  . TEE WITHOUT CARDIOVERSION N/A 10/10/2015   Procedure: TRANSESOPHAGEAL ECHOCARDIOGRAM (TEE);  Surgeon: [name], MD;  Location: [place];  Service: Open Heart Surgery;  Laterality: N/A;  . TONSILLECTOMY    . TUBAL LIGATION      There were no vitals filed for this visit.          [facility] PT Assessment - 07/24/16 0001      Standardized Balance Assessment   Five times sit to stand comments  10 seconds                     [facility] Adult PT Treatment/Exercise - 07/24/16 0001      High Level Balance  High Level Balance Activities Side stepping   High Level Balance Comments 2x25 feet     Lumbar Exercises: Aerobic   UBE (Upper Arm Bike) Level 1 x 6 minutes (3/3)   for postural strength and endurance     Knee/Hip Exercises: Aerobic   Nustep Level 2 x 10  minutes   seat 7, arms 9     Knee/Hip Exercises: Standing   Heel Raises 2 sets;10 reps   Lateral Step Up Both;2 sets;10 reps   Rocker Board 3 minutes   Rebounder 3 directions x 1 minute each   Walking with Sports Cord 20# forward and reverse x 10 each   Gait Training singe leg stance 3x10 seconds each leg   Other Standing Knee Exercises mini squats 2x10                  PT Short Term Goals - 07/22/16 1553      PT SHORT TERM GOAL #2   Title perform single leg stance on Rt and Lt x 4-5 seconds to improve stability   Status Achieved            PT Long Term Goals - 07/22/16 1539      PT LONG TERM GOAL #1   Title be independent in advanced HEP   Time 8   Period Weeks   Status On-going     PT LONG TERM GOAL #2   Title report a 50% improved confidence with gait in the community   Time 8   Period Weeks   Status On-going     PT LONG TERM GOAL #3   Title perform 5x sit to stand in < or = to 12 seconds to improve stabiltiy   Status Achieved     PT LONG TERM GOAL #4   Title report no falls x 4 weeks   Time 8   Period Weeks   Status On-going               Plan - 08/02/16 1529    Clinical Impression Statement Single leg stance was improved this week to 11 seconds on the Rt and 6 seconds on the Lt.  5x sit to stand was 10 seconds.  Pt reports minimal improved confidence with walking in the comunity.  No falls reported.  Pt will continue to benefit from skilled PT for for strength, balance, proprioception and endurance activity progression.     Rehab Potential Good   PT Frequency 2x / week   PT Duration 8 weeks   PT Treatment/Interventions ADLs/Self Care Home Management;Cryotherapy;Moist Heat;Therapeutic exercise;Therapeutic activities;Functional mobility training;Stair training;Gait training;Neuromuscular re-education;Patient/family education;Manual techniques;Passive range of motion   PT Next Visit Plan Gait and balance training.  Postural strength   Consulted and Agree with Plan of Care Patient      Patient will benefit from skilled therapeutic intervention in order to improve the following deficits and impairments:  Abnormal gait, Decreased range of motion, Difficulty walking, Postural dysfunction, Decreased activity tolerance, Decreased safety awareness  Visit Diagnosis: Muscle weakness (generalized)  Difficulty in walking, not elsewhere classified       G-Codes - 02-Aug-2016 1533    Functional Assessment Tool Used 5x sit to stand, clinical judgement   Functional Limitation Mobility: Walking and  moving around   Mobility: Walking and Moving Around Current Status [id number]) At least 20 percent but less than 40 percent impaired, limited or restricted   Mobility: Walking and Moving Around Goal Status [id number]) At least 20 percent but  less than 40 percent impaired, limited or restricted      Problem List Patient Active Problem List   Diagnosis Date Noted  . CAD (coronary artery disease) 02/22/2016  . Unsteadiness 11/20/2015  . PAF (paroxysmal atrial fibrillation) ([place]) 11/06/2015  . Chronic diastolic CHF (congestive heart failure), NYHA class 2 ([place]) 11/06/2015  . S/P MVR (mitral valve repair) 10/10/2015  . Severe mitral regurgitation   . Shortness of breath   . Acute congestive heart failure ([place])   . Left hip pain   . Mitral regurgitation 10/02/2015  . Paroxysmal atrial fibrillation ([place]) 10/01/2015  . Acute respiratory failure with hypoxia and hypercapnia (HCC)   . Respiratory distress 09/30/2015  . Acute on chronic diastolic congestive heart failure, NYHA class 3 ([place]) 09/30/2015  . Hx of fracture 04/26/2015  . Diplopia 04/26/2015  . Medicare annual wellness visit, subsequent 07/26/2014  . Diarrhea 03/30/2014  . Rhinitis 10/06/2013  . Recurrent vomiting 10/06/2013  . Hx of vomiting 05/26/2013  . Carotid artery disease without cerebral infarction ([place]) 03/22/2013  . Hypertension, isolated systolic 0000000  . Bereavement 03/22/2013  . Fracture, finger 07/13/2012  . Patellar fracture 07/13/2012  . Left-sided chest wall pain 04/10/2012  . Neuritis 04/10/2012  . Constipation, other cause 03/31/2012  . Scapular fracture 03/31/2012  . Hx of fall 03/31/2012  . Esophageal disorder 11/27/2011  . Atypical chest pain 11/27/2011  . Dizziness 11/27/2011  . Postmenopausal HRT (hormone replacement therapy) 09/23/2011  . Elevated blood pressure 03/25/2011  . Subclavian steal syndrome 09/24/2010  . GAIT IMBALANCE 03/19/2010  . SKIN LESION, UNCERTAIN SIGNIFICANCE 09/18/2009  .  SLEEPLESSNESS 03/20/2009  . HYPERGLYCEMIA 03/20/2009  . VITAMIN D DEFICIENCY 11/09/2007  . ALLERGIC RHINITIS 11/09/2007  . LEG PAIN, BILATERAL 11/09/2007  . Hypothyroidism 08/19/2007  . HYPERLIPIDEMIA 08/19/2007  . GERD 08/19/2007  . OSTEOARTHRITIS 08/19/2007  . Osteoporosis 08/19/2007   Sigurd Sos, PT 07/24/16 3:59 PM   [facility] [address], [place], [place], [address] Phone: [phone]   Fax:  [phone]  Name: AUNYA GERACE MRN: [id number] Date of Birth: [birth year]

## 2016-07-25 ENCOUNTER — Ambulatory Visit (HOSPITAL_COMMUNITY): Payer: Medicare Other

## 2016-07-28 ENCOUNTER — Ambulatory Visit (HOSPITAL_COMMUNITY): Payer: Medicare Other

## 2016-07-28 ENCOUNTER — Other Ambulatory Visit: Payer: Self-pay

## 2016-07-28 NOTE — Patient Outreach (Signed)
[facility]) Care Management  07/28/2016  Amy Fischer [birth year] [id number]   Telephone call to patient for monthly outreach. No answer.  HIPAA compliant voice message left.   Plan: RN Health Coach will attempt patient in the month of August.    Sandrina Heaton J Avelino Herren, RN, MSN [place] [phone]

## 2016-07-30 ENCOUNTER — Ambulatory Visit (HOSPITAL_COMMUNITY): Payer: Medicare Other

## 2016-08-01 ENCOUNTER — Ambulatory Visit (HOSPITAL_COMMUNITY): Payer: Medicare Other

## 2016-08-01 ENCOUNTER — Other Ambulatory Visit: Payer: Self-pay

## 2016-08-01 NOTE — Patient Outreach (Signed)
[name]  08/01/2016   Amy Fischer March 01, 1929 [id number]  Subjective: Telephone call to patient for monthly call.  Patient reports she is doing pretty good. Patient reports she is doing therapy for her balance and hopes to be done in about a month.  Patient wants to go back to cardiac rehab but knows she must get her balance right.  Patient denies any falls since last conversation.  Patient reports that her last weight was 116.8 lbs and that it stays about there.  Discussed with patient signs of heart failure and when to notify physician. She verbalized understanding.  Patient voices no concerns.    Objective:   Encounter Medications:  Outpatient Encounter Prescriptions as of 08/01/2016  Medication Sig  . acetaminophen (TYLENOL) 325 MG tablet Take 2 tablets (650 mg total) by mouth every 6 (six) hours as needed for mild pain.  [name] aspirin EC 81 MG EC tablet Take 1 tablet (81 mg total) by mouth daily. (Patient taking differently: Take 81 mg by mouth at bedtime. )  . budesonide-formoterol (SYMBICORT) 160-4.5 MCG/ACT inhaler Inhale 2 puffs into the lungs 2 (two) times daily. Or as directed (Patient taking differently: Inhale 2 puffs into the lungs 2 (two) times daily as needed. Or as directed)  . DiphenhydrAMINE HCl (BENADRYL PO) Take by mouth.  . docusate sodium (COLACE) 100 MG capsule Take 100 mg by mouth daily as needed for mild constipation.  . fish oil-omega-3 fatty acids 1000 MG capsule Take 1 g by mouth daily.   . folic acid (FOLVITE) 1 MG tablet TAKE 1 TABLET DAILY  . glucosamine-chondroitin 500-400 MG tablet Take 1 tablet by mouth daily.   [name] ipratropium (ATROVENT) 0.03 % nasal spray Place 2 sprays into both nostrils 3 (three) times daily as needed for rhinitis.  [name] levothyroxine (SYNTHROID, LEVOTHROID) 100 MCG tablet TAKE 1 TABLET EVERY MORNING ON AN EMPTY STOMACH  . Melatonin 3 MG TABS Take 3 mg by mouth at bedtime.   . MULTIPLE  VITAMIN PO Take 1 tablet by mouth daily. 50+ Senior Vitamin Daily  . omeprazole (PRILOSEC) 40 MG capsule TAKE 1 CAPSULE DAILY (Patient taking differently: TAKE 1 CAPSULE DAILY before dinner)  . Polyethyl Glycol-Propyl Glycol (SYSTANE OP) Place 1 drop into both eyes 2 (two) times daily. Use 1-2 Drops in both eyes  . PROAIR HFA 108 (90 Base) MCG/ACT inhaler Inhale 2 puffs into the lungs every 6 (six) hours as needed for wheezing or shortness of breath  . SALINE NASAL SPRAY NA Place 1 spray into both nostrils 2 (two) times daily as needed (congestion).   . traMADol (ULTRAM) 50 MG tablet Take 1 tablet (50 mg total) by mouth every 6 (six) hours as needed.  . vitamin E 400 UNIT capsule Take 400 Units by mouth daily.  [name] ZETIA 10 MG tablet TAKE 1 TABLET DAILY  . CRESTOR 5 MG tablet TAKE ONE-HALF (1/2) TABLET DAILY (Patient not taking: Reported on 08/01/2016)   No facility-administered encounter medications on file as of 08/01/2016.     Functional Status:  No flowsheet data found.  Fall/Depression Screening: PHQ 2/9 Scores 08/01/2016 06/23/2016 05/22/2016 04/23/2016 03/21/2016 02/21/2016 01/31/2016  PHQ - 2 Score 0 0 0 0 0 0 0  PHQ- 9 Score - - - - - - -    Assessment: Patient continues to benefit from health coach outreach for disease management and support.    Plan:  [facility] CM Care Plan Problem One  Flowsheet Row Most Recent Value  Care Plan Problem One  Heart Failure knowledge deficit  Role Documenting the Problem One  Health Coach  Care Plan for Problem One  Active  [facility] Long Term Goal (31-90 days)  Patient will verbalize signs and symptoms of heart failure within 90 days.  [facility] Long Term Goal Start Date  08/01/16 [goal continued]  Interventions for Problem One Long Term Goal  RN Health Coach reiterated signs and symptoms of heart failure with patient.  [facility] CM Short Term Goal #1 (0-30 days)  Patient will verbalize yellow zone of heart failure chart within 30 days  TH[facility] CM Short Term Goal #1 Start Date   08/01/16 [goal continued]  Interventions for Short Term Goal #1  RN Health Coach reiterated yellow zone of heart failure chart with patient.     RN Health Coach will contact patient in the month of September and patient agrees to next outreach.  Jone Baseman, RN, MSN [place] [phone]

## 2016-08-04 ENCOUNTER — Ambulatory Visit (HOSPITAL_COMMUNITY): Payer: Medicare Other

## 2016-08-06 ENCOUNTER — Ambulatory Visit (HOSPITAL_COMMUNITY): Payer: Medicare Other

## 2016-08-07 ENCOUNTER — Encounter: Payer: Medicare Other | Admitting: Physical Therapy

## 2016-08-07 NOTE — Progress Notes (Signed)
Subjective:   Amy Fischer is a 82 y.o. female who presents for Medicare Annual (Subsequent) preventive examination.   HRA assessment completed during this visit with Amy Fischer  The Patient was informed that the wellness visit is to identify future health risk and educate and initiate measures that can reduce risk for increased disease through the lifespan.    NO ROS; Medicare Wellness Visit Last OV:  05/2016 Labs completed: Annual lab 11/2015 CHF 09/2015 with Mitral Valve repair;  Unsteadiness/ fear of falling / in PT Osteoporosis - -2.5 took fosamax at 2012; on prolia  CHf- had Mitral valve repair  Was told to weigh every day; but can't do that. States she can tell when she starts getting fluid  ED 5/17 fall  PT and cardiac rehab   Psychosocial: (family HTN, stroke, HD)  Former smoker; Quit 79'  ETOH Glass of wine/ once a week Community events; gets together w friends  Medications reviewed for issues; compliance; otc meds Does not take a whole; takes 1/2 daily and one whole pill x 1 a week  BMI: 21  Diet low sodium Tried to eat less salt; sprinkle cheese with eggs;  Lunch; sandwich;  Supper; some meat and 2 vegetables;  Eats  A lot of fruit  Dental work: q 6 months; tooth that broke last spring Has broken post with crown   Exercise; Currently  in rehab Working on sit to stand Leg raises 3 directions Heel Raises Others ongoing Plan is to go back to cardiac rehab and then on to structured class Silver sneaker   Hx of walking 2 miles every day until she fell  Goes to [employer] and walks   [place] reviewed for short term and long term safety if aging in place. One level home Home: level; barriers; or needs identified as bathroom railing or other review; Bathroom safety/ shower has railing separate from tub Fall hx; 1; was problematic - in PT  Given education on "Fall Prevention in the Home" for more safety tips the patient can apply as appropriate.    Personal safety issues reviewed:  1.  for risk such as safe community 2.  smoke detector 3.  firearms safety if applicable  4. protection when in the sun;  5. driving safety for seniors or any recent accidents.none  UA or BOWEL incontinence;  Current urinary since Monday night Burning and dysruia;  May be better, but not gone Will see NP today   Functional losses from last year to this year? No  Long term plans to stay in home  But may go to [facility]/ name is on the list   Risk for Depression reviewed: Any emotional problems? Anxious, depressed, irritable, sad or blue? Have been depressed since she had surgery  More related to feeling confined Plays bridge; socially around engaged with people around the community; just feels bored at times since surgery. S/ s of depression reviewed  Situational depression but responds quickly to exercise and friends "cabin fever'   Denies feeling depressed or hopeless; voices pleasure in daily life How many social activities have you been engaged in within the last 2 weeks? At least 2 to 3 a week; including church    Sleeps well most nights  Cognitive; plays bridge;  Alert and engaged  Manages checkbook, medications; no failures of task Ad8 score reviewed for issues;  Issues making decisions; no  Less interest in hobbies / activities" no  Repeats questions, stories; family complaining: NO  Trouble using ordinary gadgets; microwave; computer: no  Forgets the month or year: no  Mismanaging finances: no  Missing apt: no but does write them down  Daily problems with thinking of memory NO Ad8 score is 0  MMSE not appropriate unless AD8 score is > 2   Advanced Directive addressed; yes; requested a copy Then stated she has a note that states "if I am in a bad state, do not put me on a respirator' Will completed and bring a copy to the office and to Dr. Regis Bill    Counseling Health Maintenance Gaps:  Colonoscopy; 12/2011-  aged  EKG: 03/2016 Mammogram: 08/2013; goes to Dr. Daiva Huge Dexa/ 07/2014 -2.5 at femoral neck/taking prolia  PAP: educated regarding the need for GYN exam;   Hearing: 4000hz - both ears  Ophthalmology exam at some time in August  Tear duct blocked    Immunizations Due: (Vaccines reviewed and educated regarding any overdue)  Overdue: Shingles; may have had case After she fell and broker her shoulder States she had it here ;   Established and updated Risk reviewed and appropriate referral made or health recommendations:   Current Care Team reviewed and updated   Cardiac Risk Factors include: advanced age (>107men, >28 women);hypertension     Objective:     Vitals: BP 130/70   Pulse 97   Temp 97.5 F (36.4 C)   Ht 5\' 2"  (1.575 m)   Wt 118 lb 3 oz (53.6 kg)   SpO2 97%   BMI 21.62 kg/m   Body mass index is 21.62 kg/m.   Tobacco History  Smoking Status  . Former Smoker  . Packs/day: 0.75  . Years: 20.00  . Quit date: 01/05/1991  Smokeless Tobacco  . Never Used     Counseling given: Yes   Past Medical History:  Diagnosis Date  . Allergy   . Anemia   . Asymptomatic carotid artery stenosis    R ICA 40% stenosis on CTA 12/2011.  . Bladder polyps   . Cataract    BILATERAL-REMOVED  . CHF (congestive heart failure) ([place])   . Diverticulosis   . Elevated blood pressure 03/25/2011   Bp readings borderline today has hx of elvation  In office and ok at home   Not checke recently   She gfeels was elevated from anxiety    . Episodic recurrent vertigo    MRI Head 2003  . Esophageal spasm   . GERD (gastroesophageal reflux disease)   . Hepatic hemangioma   . History of hepatitis    unknown type  . Hyperlipidemia   . Hyperplastic colon polyp   . Hypothyroidism   . Intestinal metaplasia of gastric mucosa   . Osteoarthritis   . Osteoporosis   . Subclavian steal syndrome    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; CTA 2013 revealed open graft   Past Surgical  History:  Procedure Laterality Date  . APPENDECTOMY    . CARDIAC CATHETERIZATION N/A 10/04/2015   Procedure: Right/Left Heart Cath and Coronary Angiography;  Surgeon: Belva Crome, MD;  Location: [facility];  Service: Cardiovascular;  Laterality: N/A;  . CAROTID-SUBCLAVIAN BYPASS GRAFT Left 1999   for DISH steal syndrome  . CHOLECYSTECTOMY    . COLONOSCOPY    . CYSTECTOMY Left    hand  . ELBOW SURGERY Left   . KNEE SURGERY Left 2014  . MITRAL VALVE REPAIR N/A 10/10/2015   Procedure: MITRAL VALVE REPAIR (MVR) WITH SIZE 28 CARPENTIER-EDWARDS PHYSIO  II ANNULOPLASTY RING;  Surgeon: [name], MD;  Location: [facility];  Service: Open Heart Surgery;  Laterality: N/A;  . ROTATOR CUFF REPAIR Left   . TEAR DUCT PROBING  07/2014  . TEE WITHOUT CARDIOVERSION N/A 10/03/2015   Procedure: TRANSESOPHAGEAL ECHOCARDIOGRAM (TEE);  Surgeon: Larey Dresser, MD;  Location: [facility];  Service: Cardiovascular;  Laterality: N/A;  . TEE WITHOUT CARDIOVERSION N/A 10/10/2015   Procedure: TRANSESOPHAGEAL ECHOCARDIOGRAM (TEE);  Surgeon: [name], MD;  Location: [facility];  Service: Open Heart Surgery;  Laterality: N/A;  . TONSILLECTOMY    . TUBAL LIGATION     Family History  Problem Relation Age of Onset  . Hypertension Mother   . Stroke Mother   . Heart disease Father   . Colon cancer Neg Hx    History  Sexual Activity  . Sexual activity: Not on file    Outpatient Encounter Prescriptions as of 08/08/2016  Medication Sig  . acetaminophen (TYLENOL) 325 MG tablet Take 2 tablets (650 mg total) by mouth every 6 (six) hours as needed for mild pain.  [name] aspirin EC 81 MG EC tablet Take 1 tablet (81 mg total) by mouth daily. (Patient taking differently: Take 81 mg by mouth at bedtime. )  . budesonide-formoterol (SYMBICORT) 160-4.5 MCG/ACT inhaler Inhale 2 puffs into the lungs 2 (two) times daily. Or as directed (Patient taking differently: Inhale 2 puffs into the lungs 2 (two) times daily  as needed. Or as directed)  . cholecalciferol (VITAMIN D) 1000 units tablet Take 1,000 Units by mouth daily.  . DiphenhydrAMINE HCl (BENADRYL PO) Take by mouth.  . docusate sodium (COLACE) 100 MG capsule Take 100 mg by mouth daily as needed for mild constipation.  . fish oil-omega-3 fatty acids 1000 MG capsule Take 1 g by mouth daily.   . folic acid (FOLVITE) 1 MG tablet TAKE 1 TABLET DAILY  . glucosamine-chondroitin 500-400 MG tablet Take 1 tablet by mouth daily.   [name] ipratropium (ATROVENT) 0.03 % nasal spray Place 2 sprays into both nostrils 3 (three) times daily as needed for rhinitis.  [name] levothyroxine (SYNTHROID, LEVOTHROID) 100 MCG tablet TAKE 1 TABLET EVERY MORNING ON AN EMPTY STOMACH  . Melatonin 3 MG TABS Take 3 mg by mouth at bedtime.   . MULTIPLE VITAMIN PO Take 1 tablet by mouth daily. 50+ Senior Vitamin Daily  . omeprazole (PRILOSEC) 40 MG capsule TAKE 1 CAPSULE DAILY (Patient taking differently: TAKE 1 CAPSULE DAILY before dinner)  . Polyethyl Glycol-Propyl Glycol (SYSTANE OP) Place 1 drop into both eyes 2 (two) times daily. Use 1-2 Drops in both eyes  . PROAIR HFA 108 (90 Base) MCG/ACT inhaler Inhale 2 puffs into the lungs every 6 (six) hours as needed for wheezing or shortness of breath  . SALINE NASAL SPRAY NA Place 1 spray into both nostrils 2 (two) times daily as needed (congestion).   . traMADol (ULTRAM) 50 MG tablet Take 1 tablet (50 mg total) by mouth every 6 (six) hours as needed.  . vitamin E 400 UNIT capsule Take 400 Units by mouth daily.  [name] ZETIA 10 MG tablet TAKE 1 TABLET DAILY  . CRESTOR 5 MG tablet TAKE ONE-HALF (1/2) TABLET DAILY   No facility-administered encounter medications on file as of 08/08/2016.     Activities of Daily Living In your present state of health, do you have any difficulty performing the following activities: 08/08/2016  Hearing? N  Vision? N  Difficulty concentrating or making decisions? N  Walking or climbing stairs? N  Dressing or bathing?  N  Doing errands, shopping? N  Preparing Food and eating ? N  Using the Toilet? N  In the past six months, have you accidently leaked urine? Y  Do you have problems with loss of bowel control? N  Managing your Medications? N  Managing your Finances? N  Housekeeping or managing your Housekeeping? N  Some recent data might be hidden    Patient Care Team: Burnis Medin, MD as PCP - General Newton Pigg, MD (Obstetrics and Gynecology) Paralee Cancel, MD (Orthopedic Surgery) Jacelyn Pi, MD as Attending Physician (Endocrinology) Lafayette Dragon, MD as Consulting Physician (Gastroenterology) Everitt Amber, MD as Consulting Physician (Ophthalmology) Lelon Perla, MD as Consulting Physician (Cardiology) [name], MD as Consulting Physician (Cardiothoracic Surgery) thn   Jon Billings, RN as [facility]    Assessment:     Exercise Activities and Dietary recommendations Current Exercise Habits: Home exercise routine;Structured exercise class, Type of exercise: strength training/weights;walking, Time (Minutes): 60, Frequency (Times/Week): 2, Weekly Exercise (Minutes/Week): 120, Intensity: Moderate  Goals    . Exercise 150 minutes per week (moderate activity)          Plan is to continue w PT and then on to cardiac rehab LT plan is to fup with structured class  May try [facility] or [employer] if they have exercise program and students that work with older adults; Will get the PT recommendations prior to moving on or stay on with [employer] "graduates"       Fall Risk Fall Risk  08/08/2016 08/01/2016 06/23/2016 05/22/2016 04/23/2016  Falls in the past year? Yes Yes Yes Yes Yes  Number falls in past yr: 1 2 or more 2 or more 2 or more -  Injury with Fall? - Yes - Yes Yes  Risk Factor Category  - High Fall Risk - - -  Risk for fall due to : - - - - History of fall(s)  Follow up Education provided;Falls evaluation completed Falls prevention  discussed Falls prevention discussed Falls prevention discussed Falls prevention discussed   Depression Screen PHQ 2/9 Scores 08/08/2016 08/01/2016 06/23/2016 05/22/2016  PHQ - 2 Score 0 0 0 0  PHQ- 9 Score - - - -     Cognitive Testing MMSE - Mini Mental State Exam 08/08/2016  Not completed: (No Data)    Immunization History  Administered Date(s) Administered  . Influenza Split 10/20/2013  . Influenza Whole 12/29/2001, 11/09/2007, 09/19/2008, 09/24/2010  . Influenza, High Dose Seasonal PF 10/31/2015  . Influenza-Unspecified 09/28/2014  . Pneumococcal Conjugate-13 07/26/2014  . Pneumococcal Polysaccharide-23 09/18/2009  . Td 04/09/1999  . Tdap 09/23/2011  . Zoster 12/29/2010   Screening Tests Health Maintenance  Topic Date Due  . ZOSTAVAX  03/30/1991  . INFLUENZA VACCINE  07/29/2016  . TETANUS/TDAP  09/22/2021  . DEXA SCAN  Completed  . PNA vac Low Risk Adult  Completed      Plan:   Will call Dr. Daiva Huge for mammogram  Will fup with Advanced Directive and will discuss with Dr. Regis Bill when complete   Will call if you have fluid build up that does not resolve or more sob  Will fup with [facility] today for bladder issues; frequency and dysuria   During the course of the visit the patient was educated and counseled about the following appropriate screening and preventive services:   Vaccines to include Pneumoccal, Influenza, Hepatitis B, Td, Zostavax, HCV  Electrocardiogram  Cardiovascular Disease  Colorectal cancer screening  Bone density screening  Diabetes screening  Glaucoma screening  Mammography/PAP  Nutrition counseling   Patient Instructions (the written plan) was given to the patient.   Wynetta Fines, RN  08/08/2016

## 2016-08-08 ENCOUNTER — Encounter: Payer: Self-pay | Admitting: Adult Health

## 2016-08-08 ENCOUNTER — Ambulatory Visit (HOSPITAL_COMMUNITY): Payer: Medicare Other

## 2016-08-08 ENCOUNTER — Ambulatory Visit (INDEPENDENT_AMBULATORY_CARE_PROVIDER_SITE_OTHER): Payer: Medicare Other | Admitting: Adult Health

## 2016-08-08 ENCOUNTER — Ambulatory Visit (INDEPENDENT_AMBULATORY_CARE_PROVIDER_SITE_OTHER): Payer: Medicare Other

## 2016-08-08 VITALS — BP 130/70 | HR 97 | Temp 97.5°F | Ht 62.0 in | Wt 118.2 lb

## 2016-08-08 VITALS — BP 132/78 | Temp 98.6°F | Wt 118.6 lb

## 2016-08-08 DIAGNOSIS — I2510 Atherosclerotic heart disease of native coronary artery without angina pectoris: Secondary | ICD-10-CM | POA: Diagnosis not present

## 2016-08-08 DIAGNOSIS — N3001 Acute cystitis with hematuria: Secondary | ICD-10-CM | POA: Diagnosis not present

## 2016-08-08 DIAGNOSIS — Z0000 Encounter for general adult medical examination without abnormal findings: Secondary | ICD-10-CM | POA: Diagnosis not present

## 2016-08-08 LAB — POCT URINALYSIS DIPSTICK
Bilirubin, UA: NEGATIVE
Glucose, UA: NEGATIVE
KETONES UA: NEGATIVE
NITRITE UA: NEGATIVE
PH UA: 6
PROTEIN UA: NEGATIVE
Spec Grav, UA: 1.02
UROBILINOGEN UA: 0.2

## 2016-08-08 MED ORDER — CIPROFLOXACIN HCL 500 MG PO TABS: 500.0000 mg | ORAL_TABLET | Freq: Two times a day (BID) | ORAL | 0 refills | Status: DC

## 2016-08-08 NOTE — Progress Notes (Signed)
I have reviewed this plan and agree with the findings

## 2016-08-08 NOTE — Progress Notes (Signed)
Subjective:    Amy Fischer is a 82 y.o. female who complains of abnormal smelling urine, burning with urination, dysuria, frequency, hematuria, incomplete bladder emptying and nocturia. She has had symptoms for 5 day.Patient denies back pain, fever and stomach ache. Patient does have a history of recurrent UTI. Patient does not have a history of pyelonephritis.   The following portions of the patient's history were reviewed and updated as appropriate: past family history, past medical history, past social history, past surgical history and problem list.  Review of Systems Pertinent items noted in HPI and remainder of comprehensive ROS otherwise negative.    Objective:    BP 132/78   Temp 98.6 F (37 C) (Oral)   Wt 118 lb 9.6 oz (53.8 kg)   BMI 21.69 kg/m   General Appearance:    Alert, cooperative, no distress, appears stated age  Head:    Normocephalic, without obvious abnormality, atraumatic  Eyes:    PERRL, conjunctiva/corneas clear, EOM's intact, fundi    benign, both eyes              Back:     Symmetric, no curvature, ROM normal, no CVA tenderness  Lungs:     Clear to auscultation bilaterally, respirations unlabored  Chest Wall:    No tenderness or deformity   Heart:    Regular rate and rhythm, S1 and S2 normal, no murmur, rub   or gallop     Abdomen:     Soft, non-tender, bowel sounds active all four quadrants,    no masses, no organomegaly              Skin:   Skin color, texture, turgor normal, no rashes or lesions     Neurologic:   CNII-XII intact, normal strength, sensation and reflexes    throughout     Laboratory:  Urine dipstick: 1+ for hemoglobin and 3+ for leukocyte esterase.   Micro exam: not done.    Assessment:    Acute cystitis     Plan:    Medications: ciprofloxacin.    1. Acute cystitis with hematuria - POC Urinalysis Dipstick - Urine culture - ciprofloxacin (CIPRO) 500 MG tablet; Take 1 tablet (500 mg total) by mouth 2 (two) times  daily.  Dispense: 6 tablet; Refill: 0 - Force fluids - Can take OTC Azo if needed  Amy Peng, NP

## 2016-08-08 NOTE — Patient Instructions (Addendum)
Amy Fischer , Thank you for taking time to come for your Medicare Wellness Visit. I appreciate your ongoing commitment to your health goals. Please review the following plan we discussed and let me know if I can assist you in the future.   Will call Dr. Daiva Huge for mammogram  Will fup with Advanced Directive and will discuss with Dr. Regis Bill when complete   Will call if you have fluid build up that does not resolve or more sob  Will fup with [facility] today for bladder issues; frequency and dysuria  These are the goals we discussed: Goals    . Exercise 150 minutes per week (moderate activity)          Plan is to continue w PT and then on to cardiac rehab LT plan is to fup with structured class  May try [facility] or [employer] if they have exercise program and students that work with older adults; Will get the PT recommendations prior to moving on or stay on with [employer] "graduates"        This is a list of the screening recommended for you and due dates:  Health Maintenance  Topic Date Due  . Shingles Vaccine  03/30/1991  . Flu Shot  07/29/2016  . Tetanus Vaccine  09/22/2021  . DEXA scan (bone density measurement)  Completed  . Pneumonia vaccines  Completed   DASH Eating Plan DASH stands for "Dietary Approaches to Stop Hypertension." The DASH eating plan is a healthy eating plan that has been shown to reduce high blood pressure (hypertension). Additional health benefits may include reducing the risk of type 2 diabetes mellitus, heart disease, and stroke. The DASH eating plan may also help with weight loss. WHAT DO I NEED TO KNOW ABOUT THE DASH EATING PLAN? For the DASH eating plan, you will follow these general guidelines:  Choose foods with a percent daily value for sodium of less than 5% (as listed on the food label).  Use salt-free seasonings or herbs instead of table salt or sea salt.  Check with your health care provider or pharmacist before using salt  substitutes.  Eat lower-sodium products, often labeled as "lower sodium" or "no salt added."  Eat fresh foods.  Eat more vegetables, fruits, and low-fat dairy products.  Choose whole grains. Look for the word "whole" as the first word in the ingredient list.  Choose fish and skinless chicken or [place] more often than red meat. Limit fish, poultry, and meat to 6 oz (170 g) each day.  Limit sweets, desserts, sugars, and sugary drinks.  Choose heart-healthy fats.  Limit cheese to 1 oz (28 g) per day.  Eat more home-cooked food and less restaurant, buffet, and fast food.  Limit fried foods.  Cook foods using methods other than frying.  Limit canned vegetables. If you do use them, rinse them well to decrease the sodium.  When eating at a restaurant, ask that your food be prepared with less salt, or no salt if possible. WHAT FOODS CAN I EAT? Seek help from a dietitian for individual calorie needs. Grains Whole grain or whole wheat bread. Brown rice. Whole grain or whole wheat pasta. Quinoa, bulgur, and whole grain cereals. Low-sodium cereals. Corn or whole wheat flour tortillas. Whole grain cornbread. Whole grain crackers. Low-sodium crackers. Vegetables Fresh or frozen vegetables (raw, steamed, roasted, or grilled). Low-sodium or reduced-sodium tomato and vegetable juices. Low-sodium or reduced-sodium tomato sauce and paste. Low-sodium or reduced-sodium canned vegetables.  Fruits All fresh,  canned (in natural juice), or frozen fruits. Meat and Other Protein Products Ground beef (85% or leaner), grass-fed beef, or beef trimmed of fat. Skinless chicken or [place]. Ground chicken or [place]. Pork trimmed of fat. All fish and seafood. Eggs. Dried beans, peas, or lentils. Unsalted nuts and seeds. Unsalted canned beans. Dairy Low-fat dairy products, such as skim or 1% milk, 2% or reduced-fat cheeses, low-fat ricotta or cottage cheese, or plain low-fat yogurt. Low-sodium or reduced-sodium  cheeses. Fats and Oils Tub margarines without trans fats. Light or reduced-fat mayonnaise and salad dressings (reduced sodium). Avocado. Safflower, olive, or canola oils. Natural peanut or almond butter. Other Unsalted popcorn and pretzels. The items listed above may not be a complete list of recommended foods or beverages. Contact your dietitian for more options. WHAT FOODS ARE NOT RECOMMENDED? Grains White bread. White pasta. White rice. Refined cornbread. Bagels and croissants. Crackers that contain trans fat. Vegetables Creamed or fried vegetables. Vegetables in a cheese sauce. Regular canned vegetables. Regular canned tomato sauce and paste. Regular tomato and vegetable juices. Fruits Dried fruits. Canned fruit in light or heavy syrup. Fruit juice. Meat and Other Protein Products Fatty cuts of meat. Ribs, chicken wings, bacon, sausage, bologna, salami, chitterlings, fatback, hot dogs, bratwurst, and packaged luncheon meats. Salted nuts and seeds. Canned beans with salt. Dairy Whole or 2% milk, cream, half-and-half, and cream cheese. Whole-fat or sweetened yogurt. Full-fat cheeses or blue cheese. Nondairy creamers and whipped toppings. Processed cheese, cheese spreads, or cheese curds. Condiments Onion and garlic salt, seasoned salt, table salt, and sea salt. Canned and packaged gravies. Worcestershire sauce. Tartar sauce. Barbecue sauce. Teriyaki sauce. Soy sauce, including reduced sodium. Steak sauce. Fish sauce. Oyster sauce. Cocktail sauce. Horseradish. Ketchup and mustard. Meat flavorings and tenderizers. Bouillon cubes. Hot sauce. Tabasco sauce. Marinades. Taco seasonings. Relishes. Fats and Oils Butter, stick margarine, lard, shortening, ghee, and bacon fat. Coconut, palm kernel, or palm oils. Regular salad dressings. Other Pickles and olives. Salted popcorn and pretzels. The items listed above may not be a complete list of foods and beverages to avoid. Contact your dietitian for  more information. WHERE CAN I FIND MORE INFORMATION? National Heart, Lung, and Blood Institute: travelstabloid.com   This information is not intended to replace advice given to you by your health care provider. Make sure you discuss any questions you have with your health care provider.   Document Released: 12/04/2011 Document Revised: 01/05/2015 Document Reviewed: 10/19/2013 Elsevier Interactive Patient Education 2016 [place] in the Home  Falls can cause injuries. They can happen to people of all ages. There are many things you can do to make your home safe and to help prevent falls.  WHAT CAN I DO ON THE OUTSIDE OF MY HOME?  Regularly fix the edges of walkways and driveways and fix any cracks.  Remove anything that might make you trip as you walk through a door, such as a raised step or threshold.  Trim any bushes or trees on the path to your home.  Use bright outdoor lighting.  Clear any walking paths of anything that might make someone trip, such as rocks or tools.  Regularly check to see if handrails are loose or broken. Make sure that both sides of any steps have handrails.  Any raised decks and porches should have guardrails on the edges.  Have any leaves, snow, or ice cleared regularly.  Use sand or salt on walking paths during winter.  Clean up any  spills in your garage right away. This includes oil or grease spills. WHAT CAN I DO IN THE BATHROOM?   Use night lights.  Install grab bars by the toilet and in the tub and shower. Do not use towel bars as grab bars.  Use non-skid mats or decals in the tub or shower.  If you need to sit down in the shower, use a plastic, non-slip stool.  Keep the floor dry. Clean up any water that spills on the floor as soon as it happens.  Remove soap buildup in the tub or shower regularly.  Attach bath mats securely with double-sided non-slip rug tape.  Do not have  throw rugs and other things on the floor that can make you trip. WHAT CAN I DO IN THE BEDROOM?  Use night lights.  Make sure that you have a light by your bed that is easy to reach.  Do not use any sheets or blankets that are too big for your bed. They should not hang down onto the floor.  Have a firm chair that has side arms. You can use this for support while you get dressed.  Do not have throw rugs and other things on the floor that can make you trip. WHAT CAN I DO IN THE KITCHEN?  Clean up any spills right away.  Avoid walking on wet floors.  Keep items that you use a lot in easy-to-reach places.  If you need to reach something above you, use a strong step stool that has a grab bar.  Keep electrical cords out of the way.  Do not use floor polish or wax that makes floors slippery. If you must use wax, use non-skid floor wax.  Do not have throw rugs and other things on the floor that can make you trip. WHAT CAN I DO WITH MY STAIRS?  Do not leave any items on the stairs.  Make sure that there are handrails on both sides of the stairs and use them. Fix handrails that are broken or loose. Make sure that handrails are as long as the stairways.  Check any carpeting to make sure that it is firmly attached to the stairs. Fix any carpet that is loose or worn.  Avoid having throw rugs at the top or bottom of the stairs. If you do have throw rugs, attach them to the floor with carpet tape.  Make sure that you have a light switch at the top of the stairs and the bottom of the stairs. If you do not have them, ask someone to add them for you. WHAT ELSE CAN I DO TO HELP PREVENT FALLS?  Wear shoes that:  Do not have high heels.  Have rubber bottoms.  Are comfortable and fit you well.  Are closed at the toe. Do not wear sandals.  If you use a stepladder:  Make sure that it is fully opened. Do not climb a closed stepladder.  Make sure that both sides of the stepladder are  locked into place.  Ask someone to hold it for you, if possible.  Clearly mark and make sure that you can see:  Any grab bars or handrails.  First and last steps.  Where the edge of each step is.  Use tools that help you move around (mobility aids) if they are needed. These include:  Canes.  Walkers.  Scooters.  Crutches.  Turn on the lights when you go into a dark area. Replace any light bulbs as soon as they  burn out.  Set up your furniture so you have a clear path. Avoid moving your furniture around.  If any of your floors are uneven, fix them.  If there are any pets around you, be aware of where they are.  Review your medicines with your doctor. Some medicines can make you feel dizzy. This can increase your chance of falling. Ask your doctor what other things that you can do to help prevent falls.   This information is not intended to replace advice given to you by your health care provider. Make sure you discuss any questions you have with your health care provider.   Document Released: 10/11/2009 Document Revised: 05/01/2015 Document Reviewed: 01/19/2015 Elsevier Interactive Patient Education 2016 [place] Maintenance, Female Adopting a healthy lifestyle and getting preventive care can go a long way to promote health and wellness. Talk with your health care provider about what schedule of regular examinations is right for you. This is a good chance for you to check in with your provider about disease prevention and staying healthy. In between checkups, there are plenty of things you can do on your own. Experts have done a lot of research about which lifestyle changes and preventive measures are most likely to keep you healthy. Ask your health care provider for more information. WEIGHT AND DIET  Eat a healthy diet  Be sure to include plenty of vegetables, fruits, low-fat dairy products, and lean protein.  Do not eat a lot of foods high in solid fats,  added sugars, or salt.  Get regular exercise. This is one of the most important things you can do for your health.  Most adults should exercise for at least 150 minutes each week. The exercise should increase your heart rate and make you sweat (moderate-intensity exercise).  Most adults should also do strengthening exercises at least twice a week. This is in addition to the moderate-intensity exercise.  Maintain a healthy weight  Body mass index (BMI) is a measurement that can be used to identify possible weight problems. It estimates body fat based on height and weight. Your health care provider can help determine your BMI and help you achieve or maintain a healthy weight.  For females 61 years of age and older:   A BMI below 18.5 is considered underweight.  A BMI of 18.5 to 24.9 is normal.  A BMI of 25 to 29.9 is considered overweight.  A BMI of 30 and above is considered obese.  Watch levels of cholesterol and blood lipids  You should start having your blood tested for lipids and cholesterol at 80 years of age, then have this test every 5 years.  You may need to have your cholesterol levels checked more often if:  Your lipid or cholesterol levels are high.  You are older than 80 years of age.  You are at high risk for heart disease.  CANCER SCREENING   Lung Cancer  Lung cancer screening is recommended for adults 47-11 years old who are at high risk for lung cancer because of a history of smoking.  A yearly low-dose CT scan of the lungs is recommended for people who:  Currently smoke.  Have quit within the past 15 years.  Have at least a 30-pack-year history of smoking. A pack year is smoking an average of one pack of cigarettes a day for 1 year.  Yearly screening should continue until it has been 15 years since you quit.  Yearly screening should stop if  you develop a health problem that would prevent you from having lung cancer treatment.  Breast  Cancer  Practice breast self-awareness. This means understanding how your breasts normally appear and feel.  It also means doing regular breast self-exams. Let your health care provider know about any changes, no matter how small.  If you are in your 20s or 30s, you should have a clinical breast exam (CBE) by a health care provider every 1-3 years as part of a regular health exam.  If you are 34 or older, have a CBE every year. Also consider having a breast X-ray (mammogram) every year.  If you have a family history of breast cancer, talk to your health care provider about genetic screening.  If you are at high risk for breast cancer, talk to your health care provider about having an MRI and a mammogram every year.  Breast cancer gene (BRCA) assessment is recommended for women who have family members with BRCA-related cancers. BRCA-related cancers include:  Breast.  Ovarian.  Tubal.  Peritoneal cancers.  Results of the assessment will determine the need for genetic counseling and BRCA1 and BRCA2 testing. Cervical Cancer Your health care provider may recommend that you be screened regularly for cancer of the pelvic organs (ovaries, uterus, and vagina). This screening involves a pelvic examination, including checking for microscopic changes to the surface of your cervix (Pap test). You may be encouraged to have this screening done every 3 years, beginning at age 35.  For women ages 29-65, health care providers may recommend pelvic exams and Pap testing every 3 years, or they may recommend the Pap and pelvic exam, combined with testing for human papilloma virus (HPV), every 5 years. Some types of HPV increase your risk of cervical cancer. Testing for HPV may also be done on women of any age with unclear Pap test results.  Other health care providers may not recommend any screening for nonpregnant women who are considered low risk for pelvic cancer and who do not have symptoms. Ask your  health care provider if a screening pelvic exam is right for you.  If you have had past treatment for cervical cancer or a condition that could lead to cancer, you need Pap tests and screening for cancer for at least 20 years after your treatment. If Pap tests have been discontinued, your risk factors (such as having a new sexual partner) need to be reassessed to determine if screening should resume. Some women have medical problems that increase the chance of getting cervical cancer. In these cases, your health care provider may recommend more frequent screening and Pap tests. Colorectal Cancer  This type of cancer can be detected and often prevented.  Routine colorectal cancer screening usually begins at 80 years of age and continues through 80 years of age.  Your health care provider may recommend screening at an earlier age if you have risk factors for colon cancer.  Your health care provider may also recommend using home test kits to check for hidden blood in the stool.  A small camera at the end of a tube can be used to examine your colon directly (sigmoidoscopy or colonoscopy). This is done to check for the earliest forms of colorectal cancer.  Routine screening usually begins at age 23.  Direct examination of the colon should be repeated every 5-10 years through 80 years of age. However, you may need to be screened more often if early forms of precancerous polyps or small growths are found. Skin  Cancer  Check your skin from head to toe regularly.  Tell your health care provider about any new moles or changes in moles, especially if there is a change in a mole's shape or color.  Also tell your health care provider if you have a mole that is larger than the size of a pencil eraser.  Always use sunscreen. Apply sunscreen liberally and repeatedly throughout the day.  Protect yourself by wearing long sleeves, pants, a wide-brimmed hat, and sunglasses whenever you are outside. HEART  DISEASE, DIABETES, AND HIGH BLOOD PRESSURE   High blood pressure causes heart disease and increases the risk of stroke. High blood pressure is more likely to develop in:  People who have blood pressure in the high end of the normal range (130-139/85-89 mm Hg).  People who are overweight or obese.  People who are African American.  If you are 60-64 years of age, have your blood pressure checked every 3-5 years. If you are 18 years of age or older, have your blood pressure checked every year. You should have your blood pressure measured twice--once when you are at a hospital or clinic, and once when you are not at a hospital or clinic. Record the average of the two measurements. To check your blood pressure when you are not at a hospital or clinic, you can use:  An automated blood pressure machine at a pharmacy.  A home blood pressure monitor.  If you are between 60 years and 41 years old, ask your health care provider if you should take aspirin to prevent strokes.  Have regular diabetes screenings. This involves taking a blood sample to check your fasting blood sugar level.  If you are at a normal weight and have a low risk for diabetes, have this test once every three years after 80 years of age.  If you are overweight and have a high risk for diabetes, consider being tested at a younger age or more often. PREVENTING INFECTION  Hepatitis B  If you have a higher risk for hepatitis B, you should be screened for this virus. You are considered at high risk for hepatitis B if:  You were born in a country where hepatitis B is common. Ask your health care provider which countries are considered high risk.  Your parents were born in a high-risk country, and you have not been immunized against hepatitis B (hepatitis B vaccine).  You have HIV or AIDS.  You use needles to inject street drugs.  You live with someone who has hepatitis B.  You have had sex with someone who has hepatitis  B.  You get hemodialysis treatment.  You take certain medicines for conditions, including cancer, organ transplantation, and autoimmune conditions. Hepatitis C  Blood testing is recommended for:  Everyone born from [birth year] through 1963.  Anyone with known risk factors for hepatitis C. Sexually transmitted infections (STIs)  You should be screened for sexually transmitted infections (STIs) including gonorrhea and chlamydia if:  You are sexually active and are younger than 80 years of age.  You are older than 80 years of age and your health care provider tells you that you are at risk for this type of infection.  Your sexual activity has changed since you were last screened and you are at an increased risk for chlamydia or gonorrhea. Ask your health care provider if you are at risk.  If you do not have HIV, but are at risk, it may be recommended that you take a  prescription medicine daily to prevent HIV infection. This is called pre-exposure prophylaxis (PrEP). You are considered at risk if:  You are sexually active and do not regularly use condoms or know the HIV status of your partner(s).  You take drugs by injection.  You are sexually active with a partner who has HIV. Talk with your health care provider about whether you are at high risk of being infected with HIV. If you choose to begin PrEP, you should first be tested for HIV. You should then be tested every 3 months for as long as you are taking PrEP.  PREGNANCY   If you are premenopausal and you may become pregnant, ask your health care provider about preconception counseling.  If you may become pregnant, take 400 to 800 micrograms (mcg) of folic acid every day.  If you want to prevent pregnancy, talk to your health care provider about birth control (contraception). OSTEOPOROSIS AND MENOPAUSE   Osteoporosis is a disease in which the bones lose minerals and strength with aging. This can result in serious bone fractures. Your  risk for osteoporosis can be identified using a bone density scan.  If you are 107 years of age or older, or if you are at risk for osteoporosis and fractures, ask your health care provider if you should be screened.  Ask your health care provider whether you should take a calcium or vitamin D supplement to lower your risk for osteoporosis.  Menopause may have certain physical symptoms and risks.  Hormone replacement therapy may reduce some of these symptoms and risks. Talk to your health care provider about whether hormone replacement therapy is right for you.  HOME CARE INSTRUCTIONS   Schedule regular health, dental, and eye exams.  Stay current with your immunizations.   Do not use any tobacco products including cigarettes, chewing tobacco, or electronic cigarettes.  If you are pregnant, do not drink alcohol.  If you are breastfeeding, limit how much and how often you drink alcohol.  Limit alcohol intake to no more than 1 drink per day for nonpregnant women. One drink equals 12 ounces of beer, 5 ounces of wine, or 1 ounces of hard liquor.  Do not use street drugs.  Do not share needles.  Ask your health care provider for help if you need support or information about quitting drugs.  Tell your health care provider if you often feel depressed.  Tell your health care provider if you have ever been abused or do not feel safe at home.   This information is not intended to replace advice given to you by your health care provider. Make sure you discuss any questions you have with your health care provider.   Document Released: 06/30/2011 Document Revised: 01/05/2015 Document Reviewed: 11/16/2013 Elsevier Interactive Patient Education 2016 [place] in the Home  Falls can cause injuries. They can happen to people of all ages. There are many things you can do to make your home safe and to help prevent falls.  WHAT CAN I DO ON THE OUTSIDE OF MY  HOME?  Regularly fix the edges of walkways and driveways and fix any cracks.  Remove anything that might make you trip as you walk through a door, such as a raised step or threshold.  Trim any bushes or trees on the path to your home.  Use bright outdoor lighting.  Clear any walking paths of anything that might make someone trip, such as rocks or tools.  Regularly check to see if handrails are loose or broken. Make sure that both sides of any steps have handrails.  Any raised decks and porches should have guardrails on the edges.  Have any leaves, snow, or ice cleared regularly.  Use sand or salt on walking paths during winter.  Clean up any spills in your garage right away. This includes oil or grease spills. WHAT CAN I DO IN THE BATHROOM?   Use night lights.  Install grab bars by the toilet and in the tub and shower. Do not use towel bars as grab bars.  Use non-skid mats or decals in the tub or shower.  If you need to sit down in the shower, use a plastic, non-slip stool.  Keep the floor dry. Clean up any water that spills on the floor as soon as it happens.  Remove soap buildup in the tub or shower regularly.  Attach bath mats securely with double-sided non-slip rug tape.  Do not have throw rugs and other things on the floor that can make you trip. WHAT CAN I DO IN THE BEDROOM?  Use night lights.  Make sure that you have a light by your bed that is easy to reach.  Do not use any sheets or blankets that are too big for your bed. They should not hang down onto the floor.  Have a firm chair that has side arms. You can use this for support while you get dressed.  Do not have throw rugs and other things on the floor that can make you trip. WHAT CAN I DO IN THE KITCHEN?  Clean up any spills right away.  Avoid walking on wet floors.  Keep items that you use a lot in easy-to-reach places.  If you need to reach something above you, use a strong step stool that has a  grab bar.  Keep electrical cords out of the way.  Do not use floor polish or wax that makes floors slippery. If you must use wax, use non-skid floor wax.  Do not have throw rugs and other things on the floor that can make you trip. WHAT CAN I DO WITH MY STAIRS?  Do not leave any items on the stairs.  Make sure that there are handrails on both sides of the stairs and use them. Fix handrails that are broken or loose. Make sure that handrails are as long as the stairways.  Check any carpeting to make sure that it is firmly attached to the stairs. Fix any carpet that is loose or worn.  Avoid having throw rugs at the top or bottom of the stairs. If you do have throw rugs, attach them to the floor with carpet tape.  Make sure that you have a light switch at the top of the stairs and the bottom of the stairs. If you do not have them, ask someone to add them for you. WHAT ELSE CAN I DO TO HELP PREVENT FALLS?  Wear shoes that:  Do not have high heels.  Have rubber bottoms.  Are comfortable and fit you well.  Are closed at the toe. Do not wear sandals.  If you use a stepladder:  Make sure that it is fully opened. Do not climb a closed stepladder.  Make sure that both sides of the stepladder are locked into place.  Ask someone to hold it for you, if possible.  Clearly mark and make sure that you can see:  Any grab bars or handrails.  First and  last steps.  Where the edge of each step is.  Use tools that help you move around (mobility aids) if they are needed. These include:  Canes.  Walkers.  Scooters.  Crutches.  Turn on the lights when you go into a dark area. Replace any light bulbs as soon as they burn out.  Set up your furniture so you have a clear path. Avoid moving your furniture around.  If any of your floors are uneven, fix them.  If there are any pets around you, be aware of where they are.  Review your medicines with your doctor. Some medicines can make  you feel dizzy. This can increase your chance of falling. Ask your doctor what other things that you can do to help prevent falls.   This information is not intended to replace advice given to you by your health care provider. Make sure you discuss any questions you have with your health care provider.   Document Released: 10/11/2009 Document Revised: 05/01/2015 Document Reviewed: 01/19/2015 Elsevier Interactive Patient Education 2016 [place] Maintenance, Female Adopting a healthy lifestyle and getting preventive care can go a long way to promote health and wellness. Talk with your health care provider about what schedule of regular examinations is right for you. This is a good chance for you to check in with your provider about disease prevention and staying healthy. In between checkups, there are plenty of things you can do on your own. Experts have done a lot of research about which lifestyle changes and preventive measures are most likely to keep you healthy. Ask your health care provider for more information. WEIGHT AND DIET  Eat a healthy diet  Be sure to include plenty of vegetables, fruits, low-fat dairy products, and lean protein.  Do not eat a lot of foods high in solid fats, added sugars, or salt.  Get regular exercise. This is one of the most important things you can do for your health.  Most adults should exercise for at least 150 minutes each week. The exercise should increase your heart rate and make you sweat (moderate-intensity exercise).  Most adults should also do strengthening exercises at least twice a week. This is in addition to the moderate-intensity exercise.  Maintain a healthy weight  Body mass index (BMI) is a measurement that can be used to identify possible weight problems. It estimates body fat based on height and weight. Your health care provider can help determine your BMI and help you achieve or maintain a healthy weight.  For females 72  years of age and older:   A BMI below 18.5 is considered underweight.  A BMI of 18.5 to 24.9 is normal.  A BMI of 25 to 29.9 is considered overweight.  A BMI of 30 and above is considered obese.  Watch levels of cholesterol and blood lipids  You should start having your blood tested for lipids and cholesterol at 80 years of age, then have this test every 5 years.  You may need to have your cholesterol levels checked more often if:  Your lipid or cholesterol levels are high.  You are older than 80 years of age.  You are at high risk for heart disease.  CANCER SCREENING   Lung Cancer  Lung cancer screening is recommended for adults 79-55 years old who are at high risk for lung cancer because of a history of smoking.  A yearly low-dose CT scan of the lungs is recommended for people who:  Currently  smoke.  Have quit within the past 15 years.  Have at least a 30-pack-year history of smoking. A pack year is smoking an average of one pack of cigarettes a day for 1 year.  Yearly screening should continue until it has been 15 years since you quit.  Yearly screening should stop if you develop a health problem that would prevent you from having lung cancer treatment.  Breast Cancer  Practice breast self-awareness. This means understanding how your breasts normally appear and feel.  It also means doing regular breast self-exams. Let your health care provider know about any changes, no matter how small.  If you are in your 20s or 30s, you should have a clinical breast exam (CBE) by a health care provider every 1-3 years as part of a regular health exam.  If you are 9 or older, have a CBE every year. Also consider having a breast X-ray (mammogram) every year.  If you have a family history of breast cancer, talk to your health care provider about genetic screening.  If you are at high risk for breast cancer, talk to your health care provider about having an MRI and a mammogram  every year.  Breast cancer gene (BRCA) assessment is recommended for women who have family members with BRCA-related cancers. BRCA-related cancers include:  Breast.  Ovarian.  Tubal.  Peritoneal cancers.  Results of the assessment will determine the need for genetic counseling and BRCA1 and BRCA2 testing. Cervical Cancer Your health care provider may recommend that you be screened regularly for cancer of the pelvic organs (ovaries, uterus, and vagina). This screening involves a pelvic examination, including checking for microscopic changes to the surface of your cervix (Pap test). You may be encouraged to have this screening done every 3 years, beginning at age 66.  For women ages 25-65, health care providers may recommend pelvic exams and Pap testing every 3 years, or they may recommend the Pap and pelvic exam, combined with testing for human papilloma virus (HPV), every 5 years. Some types of HPV increase your risk of cervical cancer. Testing for HPV may also be done on women of any age with unclear Pap test results.  Other health care providers may not recommend any screening for nonpregnant women who are considered low risk for pelvic cancer and who do not have symptoms. Ask your health care provider if a screening pelvic exam is right for you.  If you have had past treatment for cervical cancer or a condition that could lead to cancer, you need Pap tests and screening for cancer for at least 20 years after your treatment. If Pap tests have been discontinued, your risk factors (such as having a new sexual partner) need to be reassessed to determine if screening should resume. Some women have medical problems that increase the chance of getting cervical cancer. In these cases, your health care provider may recommend more frequent screening and Pap tests. Colorectal Cancer  This type of cancer can be detected and often prevented.  Routine colorectal cancer screening usually begins at 80  years of age and continues through 80 years of age.  Your health care provider may recommend screening at an earlier age if you have risk factors for colon cancer.  Your health care provider may also recommend using home test kits to check for hidden blood in the stool.  A small camera at the end of a tube can be used to examine your colon directly (sigmoidoscopy or colonoscopy). This is done  to check for the earliest forms of colorectal cancer.  Routine screening usually begins at age 38.  Direct examination of the colon should be repeated every 5-10 years through 80 years of age. However, you may need to be screened more often if early forms of precancerous polyps or small growths are found. Skin Cancer  Check your skin from head to toe regularly.  Tell your health care provider about any new moles or changes in moles, especially if there is a change in a mole's shape or color.  Also tell your health care provider if you have a mole that is larger than the size of a pencil eraser.  Always use sunscreen. Apply sunscreen liberally and repeatedly throughout the day.  Protect yourself by wearing long sleeves, pants, a wide-brimmed hat, and sunglasses whenever you are outside. HEART DISEASE, DIABETES, AND HIGH BLOOD PRESSURE   High blood pressure causes heart disease and increases the risk of stroke. High blood pressure is more likely to develop in:  People who have blood pressure in the high end of the normal range (130-139/85-89 mm Hg).  People who are overweight or obese.  People who are African American.  If you are 77-65 years of age, have your blood pressure checked every 3-5 years. If you are 6 years of age or older, have your blood pressure checked every year. You should have your blood pressure measured twice--once when you are at a hospital or clinic, and once when you are not at a hospital or clinic. Record the average of the two measurements. To check your blood pressure  when you are not at a hospital or clinic, you can use:  An automated blood pressure machine at a pharmacy.  A home blood pressure monitor.  If you are between 3 years and 76 years old, ask your health care provider if you should take aspirin to prevent strokes.  Have regular diabetes screenings. This involves taking a blood sample to check your fasting blood sugar level.  If you are at a normal weight and have a low risk for diabetes, have this test once every three years after 80 years of age.  If you are overweight and have a high risk for diabetes, consider being tested at a younger age or more often. PREVENTING INFECTION  Hepatitis B  If you have a higher risk for hepatitis B, you should be screened for this virus. You are considered at high risk for hepatitis B if:  You were born in a country where hepatitis B is common. Ask your health care provider which countries are considered high risk.  Your parents were born in a high-risk country, and you have not been immunized against hepatitis B (hepatitis B vaccine).  You have HIV or AIDS.  You use needles to inject street drugs.  You live with someone who has hepatitis B.  You have had sex with someone who has hepatitis B.  You get hemodialysis treatment.  You take certain medicines for conditions, including cancer, organ transplantation, and autoimmune conditions. Hepatitis C  Blood testing is recommended for:  Everyone born from [birth year] through 1963.  Anyone with known risk factors for hepatitis C. Sexually transmitted infections (STIs)  You should be screened for sexually transmitted infections (STIs) including gonorrhea and chlamydia if:  You are sexually active and are younger than 80 years of age.  You are older than 80 years of age and your health care provider tells you that you are at risk for this  type of infection.  Your sexual activity has changed since you were last screened and you are at an increased risk  for chlamydia or gonorrhea. Ask your health care provider if you are at risk.  If you do not have HIV, but are at risk, it may be recommended that you take a prescription medicine daily to prevent HIV infection. This is called pre-exposure prophylaxis (PrEP). You are considered at risk if:  You are sexually active and do not regularly use condoms or know the HIV status of your partner(s).  You take drugs by injection.  You are sexually active with a partner who has HIV. Talk with your health care provider about whether you are at high risk of being infected with HIV. If you choose to begin PrEP, you should first be tested for HIV. You should then be tested every 3 months for as long as you are taking PrEP.  PREGNANCY   If you are premenopausal and you may become pregnant, ask your health care provider about preconception counseling.  If you may become pregnant, take 400 to 800 micrograms (mcg) of folic acid every day.  If you want to prevent pregnancy, talk to your health care provider about birth control (contraception). OSTEOPOROSIS AND MENOPAUSE   Osteoporosis is a disease in which the bones lose minerals and strength with aging. This can result in serious bone fractures. Your risk for osteoporosis can be identified using a bone density scan.  If you are 45 years of age or older, or if you are at risk for osteoporosis and fractures, ask your health care provider if you should be screened.  Ask your health care provider whether you should take a calcium or vitamin D supplement to lower your risk for osteoporosis.  Menopause may have certain physical symptoms and risks.  Hormone replacement therapy may reduce some of these symptoms and risks. Talk to your health care provider about whether hormone replacement therapy is right for you.  HOME CARE INSTRUCTIONS   Schedule regular health, dental, and eye exams.  Stay current with your immunizations.   Do not use any tobacco products  including cigarettes, chewing tobacco, or electronic cigarettes.  If you are pregnant, do not drink alcohol.  If you are breastfeeding, limit how much and how often you drink alcohol.  Limit alcohol intake to no more than 1 drink per day for nonpregnant women. One drink equals 12 ounces of beer, 5 ounces of wine, or 1 ounces of hard liquor.  Do not use street drugs.  Do not share needles.  Ask your health care provider for help if you need support or information about quitting drugs.  Tell your health care provider if you often feel depressed.  Tell your health care provider if you have ever been abused or do not feel safe at home.   This information is not intended to replace advice given to you by your health care provider. Make sure you discuss any questions you have with your health care provider.   Document Released: 06/30/2011 Document Revised: 01/05/2015 Document Reviewed: 11/16/2013 Elsevier Interactive Patient Education 2016 [place] A mammogram is an X-ray of the breasts that is done to check for abnormal changes. This procedure can screen for and detect any changes that may suggest breast cancer. A mammogram can also identify other changes and variations in the breast, such as:  Inflammation of the breast tissue (mastitis).  An infected area that contains a collection of pus (abscess).  A fluid-filled sac (cyst).  Fibrocystic changes. This is when breast tissue becomes denser, which can make the tissue feel rope-like or uneven under the skin.  Tumors that are not cancerous (benign). LET [facility] CARE PROVIDER KNOW ABOUT:  Any allergies you have.  If you have breast implants.  If you have had previous breast disease, biopsy, or surgery.  If you are breastfeeding.  Any possibility that you could be pregnant, if this applies.  If you are younger than age 91.  If you have a family history of breast cancer. RISKS AND  COMPLICATIONS Generally, this is a safe procedure. However, problems may occur, including:  Exposure to radiation. Radiation levels are very low with this test.  The results being misinterpreted.  The need for further tests.  The inability of the mammogram to detect certain cancers. BEFORE THE PROCEDURE  Schedule your test about 1-2 weeks after your menstrual period. This is usually when your breasts are the least tender.  If you have had a mammogram done at a different facility in the past, get the mammogram X-rays or have them sent to your current exam facility in order to compare them.  Wash your breasts and under your arms the day of the test.  Do not wear deodorants, perfumes, lotions, or powders anywhere on your body on the day of the test.  Remove any jewelry from your neck.  Wear clothes that you can change into and out of easily. PROCEDURE  You will undress from the waist up and put on a gown.  You will stand in front of the X-ray machine.  Each breast will be placed between two plastic or glass plates. The plates will compress your breast for a few seconds. Try to stay as relaxed as possible during the procedure. This does not cause any harm to your breasts and any discomfort you feel will be very brief.  X-rays will be taken from different angles of each breast. The procedure may vary among health care providers and hospitals. AFTER THE PROCEDURE  The mammogram will be examined by a specialist (radiologist).  You may need to repeat certain parts of the test, depending on the quality of the images. This is commonly done if the radiologist needs a better view of the breast tissue.  Ask when your test results will be ready. Make sure you get your test results.  You may resume your normal activities.   This information is not intended to replace advice given to you by your health care provider. Make sure you discuss any questions you have with your health care  provider.   Document Released: 12/12/2000 Document Revised: 09/05/2015 Document Reviewed: 02/23/2015 Elsevier Interactive Patient Education [employer].

## 2016-08-11 ENCOUNTER — Ambulatory Visit (HOSPITAL_COMMUNITY): Payer: Medicare Other

## 2016-08-11 LAB — URINE CULTURE: Colony Count: >=100000

## 2016-08-12 ENCOUNTER — Ambulatory Visit: Payer: Medicare Other | Attending: Internal Medicine

## 2016-08-12 DIAGNOSIS — R262 Difficulty in walking, not elsewhere classified: Secondary | ICD-10-CM

## 2016-08-12 DIAGNOSIS — M6281 Muscle weakness (generalized): Secondary | ICD-10-CM | POA: Insufficient documentation

## 2016-08-12 NOTE — Therapy (Signed)
[facility] [address], [place], [place], [address] Phone: [phone]   Fax:  [phone]  Physical Therapy Treatment  Patient Details  Name: Amy Fischer MRN: [id number] Date of Birth: 1929-06-18 Referring Provider: Shanon Ace, MD  Encounter Date: 08/12/2016      PT End of Session - 08/12/16 1544    Visit Number 11   Number of Visits 20   Date for PT Re-Evaluation 09/12/16   PT Start Time [id number]   PT Stop Time 1601   PT Time Calculation (min) 46 min   Activity Tolerance Patient tolerated treatment well   Behavior During Therapy [facility] for tasks assessed/performed      Past Medical History:  Diagnosis Date  . Allergy   . Anemia   . Asymptomatic carotid artery stenosis    R ICA 40% stenosis on CTA 12/2011.  . Bladder polyps   . Cataract    BILATERAL-REMOVED  . CHF (congestive heart failure) ([place])   . Diverticulosis   . Elevated blood pressure 03/25/2011   Bp readings borderline today has hx of elvation  In office and ok at home   Not checke recently   She gfeels was elevated from anxiety    . Episodic recurrent vertigo    MRI Head 2003  . Esophageal spasm   . GERD (gastroesophageal reflux disease)   . Hepatic hemangioma   . History of hepatitis    unknown type  . Hyperlipidemia   . Hyperplastic colon polyp   . Hypothyroidism   . Intestinal metaplasia of gastric mucosa   . Osteoarthritis   . Osteoporosis   . Subclavian steal syndrome    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; CTA 2013 revealed open graft    Past Surgical History:  Procedure Laterality Date  . APPENDECTOMY    . CARDIAC CATHETERIZATION N/A 10/04/2015   Procedure: Right/Left Heart Cath and Coronary Angiography;  Surgeon: Belva Crome, MD;  Location: [place] [facility];  Service: Cardiovascular;  Laterality: N/A;  . CAROTID-SUBCLAVIAN BYPASS GRAFT Left 1999   for [place] steal syndrome  . CHOLECYSTECTOMY    . COLONOSCOPY    .  CYSTECTOMY Left    hand  . ELBOW SURGERY Left   . KNEE SURGERY Left 2014  . MITRAL VALVE REPAIR N/A 10/10/2015   Procedure: MITRAL VALVE REPAIR (MVR) WITH SIZE 28 CARPENTIER-EDWARDS PHYSIO II ANNULOPLASTY RING;  Surgeon: [name], MD;  Location: [place];  Service: Open Heart Surgery;  Laterality: N/A;  . ROTATOR CUFF REPAIR Left   . TEAR DUCT PROBING  07/2014  . TEE WITHOUT CARDIOVERSION N/A 10/03/2015   Procedure: TRANSESOPHAGEAL ECHOCARDIOGRAM (TEE);  Surgeon: Larey Dresser, MD;  Location: [place];  Service: Cardiovascular;  Laterality: N/A;  . TEE WITHOUT CARDIOVERSION N/A 10/10/2015   Procedure: TRANSESOPHAGEAL ECHOCARDIOGRAM (TEE);  Surgeon: [name], MD;  Location: [place];  Service: Open Heart Surgery;  Laterality: N/A;  . TONSILLECTOMY    . TUBAL LIGATION      There were no vitals filed for this visit.      Subjective Assessment - 08/12/16 1528    Subjective Pt had lapse in treatment.  Pt was sick last week.     Pertinent History neuropathy in bil. feet, osteoporosis   Currently in Pain? No/denies            [facility] PT Assessment - 08/12/16 0001      Assessment   Medical Diagnosis  Hx of fall, unsteadiness   Onset Date/Surgical Date 05/14/16  fall at [facility]     Prior Function   Level of Independence Independent     Cognition   Overall Cognitive Status Within Functional Limits for tasks assessed     Posture/Postural Control   Posture/Postural Control Postural limitations   Postural Limitations Rounded Shoulders;Forward head;Flexed trunk     Standardized Balance Assessment   Five times sit to stand comments  9 seconds     Timed Up and Go Test   TUG Normal TUG   Normal TUG (seconds) 10                     [facility] Adult PT Treatment/Exercise - 08/12/16 0001      High Level Balance   High Level Balance Activities Side stepping   High Level Balance Comments 2x25 feet     Lumbar Exercises: Aerobic   UBE  (Upper Arm Bike) Level 1 x 6 minutes (3/3)   for postural strength and endurance     Knee/Hip Exercises: Aerobic   Nustep Level 2 x 10  minutes   seat 7, arms 9     Knee/Hip Exercises: Standing   Heel Raises 2 sets;10 reps   Lateral Step Up Both;2 sets;10 reps   Rocker Board 3 minutes   Rebounder 3 directions x 1 minute each   Walking with Sports Cord 20# forward and reverse x 10 each   Gait Training singe leg stance 3x10 seconds each leg   Other Standing Knee Exercises mini squats 2x10                  PT Short Term Goals - 08/12/16 1530      PT SHORT TERM GOAL #1   Title be independent in initial HEP   Status Achieved     PT SHORT TERM GOAL #2   Title perform single leg stance on Rt and Lt x 4-5 seconds to improve stability   Status Achieved     PT SHORT TERM GOAL #3   Title perform TUG in < or = to 12 seconds to improve stability   Status Achieved     PT SHORT TERM GOAL #4   Title verbalize understanding of correct posture, body mechanics and education regarding osteoprosis   Status Achieved           PT Long Term Goals - 08/12/16 1530      PT LONG TERM GOAL #1   Title be independent in advanced HEP   Time 4   Period Weeks   Status On-going     PT LONG TERM GOAL #2   Title report a 75% improved confidence with gait in the community   Time 4   Status Revised     PT LONG TERM GOAL #3   Title perform 5x sit to stand in < or = to 12 seconds to improve stabiltiy   Status Achieved     PT LONG TERM GOAL #4   Title report no falls x 4 weeks   Status Achieved               Plan - 08/12/16 1539    Clinical Impression Statement Pt had lapse in treatment and has been doing exercises at home. Pt able to perform 5x sit to stand in 9 seconds and TUG 10 seconds.  Pt reports 50% overall improvement in confidence with balance since the start of care.  She demonstrates mild instability wiht gait on level surface.  Single leg stance on the Rt is 6  seconds, and Lt is 8 seconds.  Pt will benefit from skilled PT for balance activity to improve safety at home and in the community.     Rehab Potential Good   PT Frequency 2x / week   PT Duration 8 weeks   PT Treatment/Interventions ADLs/Self Care Home Management;Cryotherapy;Moist Heat;Therapeutic exercise;Therapeutic activities;Functional mobility training;Stair training;Gait training;Neuromuscular re-education;Patient/family education;Manual techniques;Passive range of motion   PT Next Visit Plan Gait and balance training.  Postural strength   Consulted and Agree with Plan of Care Patient      Patient will benefit from skilled therapeutic intervention in order to improve the following deficits and impairments:  Abnormal gait, Decreased range of motion, Difficulty walking, Postural dysfunction, Decreased activity tolerance, Decreased safety awareness  Visit Diagnosis: Muscle weakness (generalized) - Plan: PT plan of care cert/re-cert  Difficulty in walking, not elsewhere classified - Plan: PT plan of care cert/re-cert     Problem List Patient Active Problem List   Diagnosis Date Noted  . CAD (coronary artery disease) 02/22/2016  . Unsteadiness 11/20/2015  . PAF (paroxysmal atrial fibrillation) ([place]) 11/06/2015  . Chronic diastolic CHF (congestive heart failure), NYHA class 2 ([place]) 11/06/2015  . S/P MVR (mitral valve repair) 10/10/2015  . Severe mitral regurgitation   . Shortness of breath   . Acute congestive heart failure ([place])   . Left hip pain   . Mitral regurgitation 10/02/2015  . Paroxysmal atrial fibrillation ([place]) 10/01/2015  . Acute respiratory failure with hypoxia and hypercapnia ([facility])   . Respiratory distress 09/30/2015  . Acute on chronic diastolic congestive heart failure, NYHA class 3 ([place]) 09/30/2015  . Hx of fracture 04/26/2015  . Diplopia 04/26/2015  . Medicare annual wellness visit, subsequent 07/26/2014  . Diarrhea 03/30/2014  . Rhinitis 10/06/2013  .  Recurrent vomiting 10/06/2013  . Hx of vomiting 05/26/2013  . Carotid artery disease without cerebral infarction ([place]) 03/22/2013  . Hypertension, isolated systolic 0000000  . Bereavement 03/22/2013  . Fracture, finger 07/13/2012  . Patellar fracture 07/13/2012  . Left-sided chest wall pain 04/10/2012  . Neuritis 04/10/2012  . Constipation, other cause 03/31/2012  . Scapular fracture 03/31/2012  . Hx of fall 03/31/2012  . Esophageal disorder 11/27/2011  . Atypical chest pain 11/27/2011  . Dizziness 11/27/2011  . Postmenopausal HRT (hormone replacement therapy) 09/23/2011  . Elevated blood pressure 03/25/2011  . Subclavian steal syndrome 09/24/2010  . GAIT IMBALANCE 03/19/2010  . SKIN LESION, UNCERTAIN SIGNIFICANCE 09/18/2009  . SLEEPLESSNESS 03/20/2009  . HYPERGLYCEMIA 03/20/2009  . VITAMIN D DEFICIENCY 11/09/2007  . ALLERGIC RHINITIS 11/09/2007  . LEG PAIN, BILATERAL 11/09/2007  . Hypothyroidism 08/19/2007  . HYPERLIPIDEMIA 08/19/2007  . GERD 08/19/2007  . OSTEOARTHRITIS 08/19/2007  . Osteoporosis 08/19/2007    Sigurd Sos, PT 08/12/16 3:54 PM  [facility] [address] [place], [place], [address] Phone: [phone]   Fax:  [phone]  Name: Amy Fischer MRN: [id number] Date of Birth: July 30, 1929

## 2016-08-13 ENCOUNTER — Ambulatory Visit (HOSPITAL_COMMUNITY): Payer: Medicare Other

## 2016-08-15 ENCOUNTER — Ambulatory Visit (HOSPITAL_COMMUNITY): Payer: Medicare Other

## 2016-08-18 ENCOUNTER — Ambulatory Visit (HOSPITAL_COMMUNITY): Payer: Medicare Other

## 2016-08-20 ENCOUNTER — Ambulatory Visit (HOSPITAL_COMMUNITY): Payer: Medicare Other

## 2016-08-20 ENCOUNTER — Ambulatory Visit: Payer: Medicare Other

## 2016-08-20 DIAGNOSIS — R262 Difficulty in walking, not elsewhere classified: Secondary | ICD-10-CM | POA: Diagnosis not present

## 2016-08-20 DIAGNOSIS — M6281 Muscle weakness (generalized): Secondary | ICD-10-CM

## 2016-08-20 NOTE — Therapy (Signed)
[facility] [address], [place], [place], [address] Phone: [phone]   Fax:  [phone]  Physical Therapy Treatment  Patient Details  Name: Amy Fischer MRN: [id number] Date of Birth: 10-11-1929 Referring Provider: Shanon Ace, MD  Encounter Date: 08/20/2016      PT End of Session - 08/20/16 1043    Visit Number 12   Number of Visits 20   Date for PT Re-Evaluation 09/12/16   PT Start Time 1013   PT Stop Time 1054   PT Time Calculation (min) 41 min   Activity Tolerance Patient tolerated treatment well   Behavior During Therapy [facility] for tasks assessed/performed      Past Medical History:  Diagnosis Date  . Allergy   . Anemia   . Asymptomatic carotid artery stenosis    R ICA 40% stenosis on CTA 12/2011.  . Bladder polyps   . Cataract    BILATERAL-REMOVED  . CHF (congestive heart failure) ([place])   . Diverticulosis   . Elevated blood pressure 03/25/2011   Bp readings borderline today has hx of elvation  In office and ok at home   Not checke recently   She gfeels was elevated from anxiety    . Episodic recurrent vertigo    MRI Head 2003  . Esophageal spasm   . GERD (gastroesophageal reflux disease)   . Hepatic hemangioma   . History of hepatitis    unknown type  . Hyperlipidemia   . Hyperplastic colon polyp   . Hypothyroidism   . Intestinal metaplasia of gastric mucosa   . Osteoarthritis   . Osteoporosis   . Subclavian steal syndrome    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; CTA 2013 revealed open graft    Past Surgical History:  Procedure Laterality Date  . APPENDECTOMY    . CARDIAC CATHETERIZATION N/A 10/04/2015   Procedure: Right/Left Heart Cath and Coronary Angiography;  Surgeon: Belva Crome, MD;  Location: [place] [facility];  Service: Cardiovascular;  Laterality: N/A;  . CAROTID-SUBCLAVIAN BYPASS GRAFT Left 1999   for  steal syndrome  . CHOLECYSTECTOMY    . COLONOSCOPY    .  CYSTECTOMY Left    hand  . ELBOW SURGERY Left   . KNEE SURGERY Left 2014  . MITRAL VALVE REPAIR N/A 10/10/2015   Procedure: MITRAL VALVE REPAIR (MVR) WITH SIZE 28 CARPENTIER-EDWARDS PHYSIO II ANNULOPLASTY RING;  Surgeon: [name], MD;  Location: [place];  Service: Open Heart Surgery;  Laterality: N/A;  . ROTATOR CUFF REPAIR Left   . TEAR DUCT PROBING  07/2014  . TEE WITHOUT CARDIOVERSION N/A 10/03/2015   Procedure: TRANSESOPHAGEAL ECHOCARDIOGRAM (TEE);  Surgeon: Larey Dresser, MD;  Location: [place];  Service: Cardiovascular;  Laterality: N/A;  . TEE WITHOUT CARDIOVERSION N/A 10/10/2015   Procedure: TRANSESOPHAGEAL ECHOCARDIOGRAM (TEE);  Surgeon: [name], MD;  Location: [place];  Service: Open Heart Surgery;  Laterality: N/A;  . TONSILLECTOMY    . TUBAL LIGATION      There were no vitals filed for this visit.                       [facility] Adult PT Treatment/Exercise - 08/20/16 0001      Lumbar Exercises: Aerobic   UBE (Upper Arm Bike) Level 1 x 6 minutes (3/3)   for postural strength and endurance     Knee/Hip Exercises: Aerobic   Stationary Bike Level 2  x 10 minutes   Nustep --     Knee/Hip Exercises: Standing   Heel Raises 2 sets;10 reps   Lateral Step Up Both;2 sets;10 reps   Rocker Board 3 minutes   Rebounder 3 directions x 1 minute each   Walking with Sports Cord 20# forward and reverse x 10 each   Gait Training singe leg stance 3x10 seconds each leg   Other Standing Knee Exercises sit to stand holding red plyoball 2x10                  PT Short Term Goals - 08/12/16 1530      PT SHORT TERM GOAL #1   Title be independent in initial HEP   Status Achieved     PT SHORT TERM GOAL #2   Title perform single leg stance on Rt and Lt x 4-5 seconds to improve stability   Status Achieved     PT SHORT TERM GOAL #3   Title perform TUG in < or = to 12 seconds to improve stability   Status Achieved     PT SHORT TERM GOAL  #4   Title verbalize understanding of correct posture, body mechanics and education regarding osteoprosis   Status Achieved           PT Long Term Goals - 08/20/16 1018      PT LONG TERM GOAL #1   Title be independent in advanced HEP   Time 4   Period Weeks   Status On-going     PT LONG TERM GOAL #2   Title report a 75% improved confidence with gait in the community   Time 4   Period Weeks   Status On-going  50% limitation     PT LONG TERM GOAL #3   Title perform 5x sit to stand in < or = to 12 seconds to improve stabiltiy   Status Achieved     PT LONG TERM GOAL #4   Title report no falls x 4 weeks   Status Achieved               Plan - 08/20/16 1020    Clinical Impression Statement Pt reports that she is feeling really good and reports 50% overall improvement in confidence with balance since the start of care.  Pt demonstrates mild instability with gain on level surface.  Single leg stance is improved bilaterally.  Pt will benefit from skilled PT for balance activity to improve safety at home and in the community.     Rehab Potential Good   PT Frequency 2x / week   PT Duration 8 weeks   PT Treatment/Interventions ADLs/Self Care Home Management;Cryotherapy;Moist Heat;Therapeutic exercise;Therapeutic activities;Functional mobility training;Stair training;Gait training;Neuromuscular re-education;Patient/family education;Manual techniques;Passive range of motion   PT Next Visit Plan Gait and balance training.  Postural strength   Consulted and Agree with Plan of Care Patient      Patient will benefit from skilled therapeutic intervention in order to improve the following deficits and impairments:  Abnormal gait, Decreased range of motion, Difficulty walking, Postural dysfunction, Decreased activity tolerance, Decreased safety awareness  Visit Diagnosis: Muscle weakness (generalized)  Difficulty in walking, not elsewhere classified     Problem List Patient  Active Problem List   Diagnosis Date Noted  . CAD (coronary artery disease) 02/22/2016  . Unsteadiness 11/20/2015  . PAF (paroxysmal atrial fibrillation) ([place]) 11/06/2015  . Chronic diastolic CHF (congestive heart failure), NYHA class 2 ([place]) 11/06/2015  . S/P MVR (mitral  valve repair) 10/10/2015  . Severe mitral regurgitation   . Shortness of breath   . Acute congestive heart failure ([place])   . Left hip pain   . Mitral regurgitation 10/02/2015  . Paroxysmal atrial fibrillation ([place]) 10/01/2015  . Acute respiratory failure with hypoxia and hypercapnia ([facility])   . Respiratory distress 09/30/2015  . Acute on chronic diastolic congestive heart failure, NYHA class 3 ([place]) 09/30/2015  . Hx of fracture 04/26/2015  . Diplopia 04/26/2015  . Medicare annual wellness visit, subsequent 07/26/2014  . Diarrhea 03/30/2014  . Rhinitis 10/06/2013  . Recurrent vomiting 10/06/2013  . Hx of vomiting 05/26/2013  . Carotid artery disease without cerebral infarction ([place]) 03/22/2013  . Hypertension, isolated systolic 0000000  . Bereavement 03/22/2013  . Fracture, finger 07/13/2012  . Patellar fracture 07/13/2012  . Left-sided chest wall pain 04/10/2012  . Neuritis 04/10/2012  . Constipation, other cause 03/31/2012  . Scapular fracture 03/31/2012  . Hx of fall 03/31/2012  . Esophageal disorder 11/27/2011  . Atypical chest pain 11/27/2011  . Dizziness 11/27/2011  . Postmenopausal HRT (hormone replacement therapy) 09/23/2011  . Elevated blood pressure 03/25/2011  . Subclavian steal syndrome 09/24/2010  . GAIT IMBALANCE 03/19/2010  . SKIN LESION, UNCERTAIN SIGNIFICANCE 09/18/2009  . SLEEPLESSNESS 03/20/2009  . HYPERGLYCEMIA 03/20/2009  . VITAMIN D DEFICIENCY 11/09/2007  . ALLERGIC RHINITIS 11/09/2007  . LEG PAIN, BILATERAL 11/09/2007  . Hypothyroidism 08/19/2007  . HYPERLIPIDEMIA 08/19/2007  . GERD 08/19/2007  . OSTEOARTHRITIS 08/19/2007  . Osteoporosis 08/19/2007     Sigurd Sos,  PT 08/20/16 10:45 AM  [facility] [address], [place], [place], [address] Phone: [phone]   Fax:  [phone]  Name: Amy Fischer MRN: [id number] Date of Birth: March 07, 1929

## 2016-08-22 ENCOUNTER — Ambulatory Visit (HOSPITAL_COMMUNITY): Payer: Medicare Other

## 2016-08-25 ENCOUNTER — Ambulatory Visit (HOSPITAL_COMMUNITY): Payer: Medicare Other

## 2016-08-27 ENCOUNTER — Ambulatory Visit (HOSPITAL_COMMUNITY): Payer: Medicare Other

## 2016-08-27 ENCOUNTER — Ambulatory Visit: Payer: Medicare Other

## 2016-08-27 DIAGNOSIS — R262 Difficulty in walking, not elsewhere classified: Secondary | ICD-10-CM | POA: Diagnosis not present

## 2016-08-27 DIAGNOSIS — M6281 Muscle weakness (generalized): Secondary | ICD-10-CM | POA: Diagnosis not present

## 2016-08-27 NOTE — Therapy (Signed)
[facility] [address] [place], [place], [address] Phone: [phone]   Fax:  [phone]  Physical Therapy Treatment  Patient Details  Name: Amy Fischer MRN: [id number] Date of Birth: [birth year] Referring Provider: Shanon Ace, MD  Encounter Date: 08/27/2016      PT End of Session - 08/27/16 1047    Visit Number 13   Number of Visits 20   Date for PT Re-Evaluation 09/12/16   PT Start Time [id number]   PT Stop Time 1057   PT Time Calculation (min) 43 min   Activity Tolerance Patient tolerated treatment well   Behavior During Therapy [facility] for tasks assessed/performed      Past Medical History:  Diagnosis Date  . Allergy   . Anemia   . Asymptomatic carotid artery stenosis    R ICA 40% stenosis on CTA 12/2011.  . Bladder polyps   . Cataract    BILATERAL-REMOVED  . CHF (congestive heart failure) ([place])   . Diverticulosis   . Elevated blood pressure 03/25/2011   Bp readings borderline today has hx of elvation  In office and ok at home   Not checke recently   She gfeels was elevated from anxiety    . Episodic recurrent vertigo    MRI Head 2003  . Esophageal spasm   . GERD (gastroesophageal reflux disease)   . Hepatic hemangioma   . History of hepatitis    unknown type  . Hyperlipidemia   . Hyperplastic colon polyp   . Hypothyroidism   . Intestinal metaplasia of gastric mucosa   . Osteoarthritis   . Osteoporosis   . Subclavian steal syndrome    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; CTA 2013 revealed open graft    Past Surgical History:  Procedure Laterality Date  . APPENDECTOMY    . CARDIAC CATHETERIZATION N/A 10/04/2015   Procedure: Right/Left Heart Cath and Coronary Angiography;  Surgeon: Belva Crome, MD;  Location: [place] [facility];  Service: Cardiovascular;  Laterality: N/A;  . CAROTID-SUBCLAVIAN BYPASS GRAFT Left 1999   for [place] steal syndrome  . CHOLECYSTECTOMY    . COLONOSCOPY    .  CYSTECTOMY Left    hand  . ELBOW SURGERY Left   . KNEE SURGERY Left 2014  . MITRAL VALVE REPAIR N/A 10/10/2015   Procedure: MITRAL VALVE REPAIR (MVR) WITH SIZE 28 CARPENTIER-EDWARDS PHYSIO II ANNULOPLASTY RING;  Surgeon: [name], MD;  Location: [place];  Service: Open Heart Surgery;  Laterality: N/A;  . ROTATOR CUFF REPAIR Left   . TEAR DUCT PROBING  07/2014  . TEE WITHOUT CARDIOVERSION N/A 10/03/2015   Procedure: TRANSESOPHAGEAL ECHOCARDIOGRAM (TEE);  Surgeon: Larey Dresser, MD;  Location: [place];  Service: Cardiovascular;  Laterality: N/A;  . TEE WITHOUT CARDIOVERSION N/A 10/10/2015   Procedure: TRANSESOPHAGEAL ECHOCARDIOGRAM (TEE);  Surgeon: [name], MD;  Location: [place];  Service: Open Heart Surgery;  Laterality: N/A;  . TONSILLECTOMY    . TUBAL LIGATION      There were no vitals filed for this visit.      Subjective Assessment - 08/27/16 1024    Subjective [place] the beach [place] and performed exercises 2x/day   Patient Stated Goals improve stability and safety   Currently in Pain? No/denies                         [facility] Adult PT Treatment/Exercise -  08/27/16 0001      Lumbar Exercises: Aerobic   UBE (Upper Arm Bike) Level 1 x 6 minutes (3/3)   for postural strength and endurance     Lumbar Exercises: Seated   Sit to Stand Limitations 3 way raises: 1# 2x10     Knee/Hip Exercises: Aerobic   Nustep Level 2 x 10  minutes   seat 7, arms 9     Knee/Hip Exercises: Standing   Heel Raises 2 sets;10 reps   Forward Step Up Both;2 sets;10 reps   Forward Step Up Limitations alternating toe taps 6" step 3x20 seconds   Rocker Board 3 minutes   Rebounder 3 directions x 1 minute each   Other Standing Knee Exercises sit to stand holding red plyoball 2x10                  PT Short Term Goals - 08/12/16 1530      PT SHORT TERM GOAL #1   Title be independent in initial HEP   Status Achieved     PT SHORT TERM GOAL  #2   Title perform single leg stance on Rt and Lt x 4-5 seconds to improve stability   Status Achieved     PT SHORT TERM GOAL #3   Title perform TUG in < or = to 12 seconds to improve stability   Status Achieved     PT SHORT TERM GOAL #4   Title verbalize understanding of correct posture, body mechanics and education regarding osteoprosis   Status Achieved           PT Long Term Goals - 08/27/16 1026      PT LONG TERM GOAL #1   Title be independent in advanced HEP   Time 4   Period Weeks   Status On-going     PT LONG TERM GOAL #2   Title report a 75% improved confidence with gait in the community   Time 4   Period Weeks   Status On-going     PT LONG TERM GOAL #3   Title perform 5x sit to stand in < or = to 12 seconds to improve stabiltiy   Status Achieved     PT LONG TERM GOAL #4   Title report no falls x 4 weeks   Status Achieved               Plan - 08/27/16 1026    Clinical Impression Statement Pt reports 50% overall improved confidence since the start of care.  Pt able to improve LE single leg stance without UE support.  Pt able to perform 6" toe taps x 20 seconds without UE suppport.  Pt is independent in HEP for balance and strength.  Pt will benefit from skilled PT for strength, endurance, gait.  2 more sessions probable.     Rehab Potential Good   PT Frequency 2x / week   PT Duration 8 weeks   PT Treatment/Interventions ADLs/Self Care Home Management;Cryotherapy;Moist Heat;Therapeutic exercise;Therapeutic activities;Functional mobility training;Stair training;Gait training;Neuromuscular re-education;Patient/family education;Manual techniques;Passive range of motion   PT Next Visit Plan Gait and balance training.  Postural strength   Consulted and Agree with Plan of Care Patient      Patient will benefit from skilled therapeutic intervention in order to improve the following deficits and impairments:  Abnormal gait, Decreased range of motion,  Difficulty walking, Postural dysfunction, Decreased activity tolerance, Decreased safety awareness  Visit Diagnosis: Muscle weakness (generalized)  Difficulty in walking, not elsewhere  classified     Problem List Patient Active Problem List   Diagnosis Date Noted  . CAD (coronary artery disease) 02/22/2016  . Unsteadiness 11/20/2015  . PAF (paroxysmal atrial fibrillation) ([place]) 11/06/2015  . Chronic diastolic CHF (congestive heart failure), NYHA class 2 ([place]) 11/06/2015  . S/P MVR (mitral valve repair) 10/10/2015  . Severe mitral regurgitation   . Shortness of breath   . Acute congestive heart failure ([place])   . Left hip pain   . Mitral regurgitation 10/02/2015  . Paroxysmal atrial fibrillation ([place]) 10/01/2015  . Acute respiratory failure with hypoxia and hypercapnia (HCC)   . Respiratory distress 09/30/2015  . Acute on chronic diastolic congestive heart failure, NYHA class 3 ([place]) 09/30/2015  . Hx of fracture 04/26/2015  . Diplopia 04/26/2015  . Medicare annual wellness visit, subsequent 07/26/2014  . Diarrhea 03/30/2014  . Rhinitis 10/06/2013  . Recurrent vomiting 10/06/2013  . Hx of vomiting 05/26/2013  . Carotid artery disease without cerebral infarction ([place]) 03/22/2013  . Hypertension, isolated systolic 0000000  . Bereavement 03/22/2013  . Fracture, finger 07/13/2012  . Patellar fracture 07/13/2012  . Left-sided chest wall pain 04/10/2012  . Neuritis 04/10/2012  . Constipation, other cause 03/31/2012  . Scapular fracture 03/31/2012  . Hx of fall 03/31/2012  . Esophageal disorder 11/27/2011  . Atypical chest pain 11/27/2011  . Dizziness 11/27/2011  . Postmenopausal HRT (hormone replacement therapy) 09/23/2011  . Elevated blood pressure 03/25/2011  . Subclavian steal syndrome 09/24/2010  . GAIT IMBALANCE 03/19/2010  . SKIN LESION, UNCERTAIN SIGNIFICANCE 09/18/2009  . SLEEPLESSNESS 03/20/2009  . HYPERGLYCEMIA 03/20/2009  . VITAMIN D DEFICIENCY 11/09/2007   . ALLERGIC RHINITIS 11/09/2007  . LEG PAIN, BILATERAL 11/09/2007  . Hypothyroidism 08/19/2007  . HYPERLIPIDEMIA 08/19/2007  . GERD 08/19/2007  . OSTEOARTHRITIS 08/19/2007  . Osteoporosis 08/19/2007     Sigurd Sos, PT 08/27/16 10:51 AM  [facility] [address], [place], [place], [address] Phone: [phone]   Fax:  [phone]  Name: DENNIS LUGAR MRN: [id number] Date of Birth: 05/12/1929

## 2016-08-29 ENCOUNTER — Ambulatory Visit (HOSPITAL_COMMUNITY): Payer: Medicare Other

## 2016-09-02 ENCOUNTER — Ambulatory Visit: Payer: Medicare Other | Attending: Internal Medicine | Admitting: Physical Therapy

## 2016-09-02 DIAGNOSIS — R262 Difficulty in walking, not elsewhere classified: Secondary | ICD-10-CM | POA: Diagnosis not present

## 2016-09-02 DIAGNOSIS — M6281 Muscle weakness (generalized): Secondary | ICD-10-CM | POA: Diagnosis not present

## 2016-09-02 NOTE — Therapy (Signed)
[facility] [address] [place], [place], [address] Phone: [phone]   Fax:  [phone]  Physical Therapy Treatment  Patient Details  Name: Amy Fischer MRN: [id number] Date of Birth: [birth year] Referring Provider: Shanon Ace, MD  Encounter Date: 09/02/2016      PT End of Session - 09/02/16 1337    Visit Number 14   Number of Visits 20   Date for PT Re-Evaluation 09/12/16   PT Start Time [id number]   PT Stop Time 1225   PT Time Calculation (min) 40 min   Activity Tolerance Patient tolerated treatment well      Past Medical History:  Diagnosis Date  . Allergy   . Anemia   . Asymptomatic carotid artery stenosis    R ICA 40% stenosis on CTA 12/2011.  . Bladder polyps   . Cataract    BILATERAL-REMOVED  . CHF (congestive heart failure) ([place])   . Diverticulosis   . Elevated blood pressure 03/25/2011   Bp readings borderline today has hx of elvation  In office and ok at home   Not checke recently   She gfeels was elevated from anxiety    . Episodic recurrent vertigo    MRI Head 2003  . Esophageal spasm   . GERD (gastroesophageal reflux disease)   . Hepatic hemangioma   . History of hepatitis    unknown type  . Hyperlipidemia   . Hyperplastic colon polyp   . Hypothyroidism   . Intestinal metaplasia of gastric mucosa   . Osteoarthritis   . Osteoporosis   . Subclavian steal syndrome    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; CTA 2013 revealed open graft    Past Surgical History:  Procedure Laterality Date  . APPENDECTOMY    . CARDIAC CATHETERIZATION N/A 10/04/2015   Procedure: Right/Left Heart Cath and Coronary Angiography;  Surgeon: Belva Crome, MD;  Location: [place] [facility];  Service: Cardiovascular;  Laterality: N/A;  . CAROTID-SUBCLAVIAN BYPASS GRAFT Left 1999   for [place] steal syndrome  . CHOLECYSTECTOMY    . COLONOSCOPY    . CYSTECTOMY Left    hand  . ELBOW SURGERY Left   . KNEE  SURGERY Left 2014  . MITRAL VALVE REPAIR N/A 10/10/2015   Procedure: MITRAL VALVE REPAIR (MVR) WITH SIZE 28 CARPENTIER-EDWARDS PHYSIO II ANNULOPLASTY RING;  Surgeon: [name], MD;  Location: [place];  Service: Open Heart Surgery;  Laterality: N/A;  . ROTATOR CUFF REPAIR Left   . TEAR DUCT PROBING  07/2014  . TEE WITHOUT CARDIOVERSION N/A 10/03/2015   Procedure: TRANSESOPHAGEAL ECHOCARDIOGRAM (TEE);  Surgeon: Larey Dresser, MD;  Location: [place];  Service: Cardiovascular;  Laterality: N/A;  . TEE WITHOUT CARDIOVERSION N/A 10/10/2015   Procedure: TRANSESOPHAGEAL ECHOCARDIOGRAM (TEE);  Surgeon: [name], MD;  Location: [place];  Service: Open Heart Surgery;  Laterality: N/A;  . TONSILLECTOMY    . TUBAL LIGATION      There were no vitals filed for this visit.      Subjective Assessment - 09/02/16 1150    Subjective No pain at present.  "Today I feel like I need a walker b/c I've got a teary eye/blurry vision which throws me off."     Currently in Pain? No/denies   Pain Score 0-No pain                         [facility] Adult  PT Treatment/Exercise - 09/02/16 0001      Knee/Hip Exercises: Standing   Heel Raises 2 sets;10 reps   Lateral Step Up Both;2 sets;10 reps  with opposite hip abduction 10x   Forward Step Up Limitations alternating toe taps 6" step 3x20 seconds   SLS with Vectors 3 ways 5x right/left   Walking with Sports Cord 20# forward and reverse x 10 each   Gait Training --   Other Standing Knee Exercises wall pull-aways 15x   Other Standing Knee Exercises weight shift 4 ways                  PT Short Term Goals - 09/02/16 1340      PT SHORT TERM GOAL #1   Title be independent in initial HEP   Status Achieved     PT SHORT TERM GOAL #2   Title perform single leg stance on Rt and Lt x 4-5 seconds to improve stability   Status Achieved     PT SHORT TERM GOAL #3   Title perform TUG in < or = to 12 seconds to improve  stability   Status Achieved     PT SHORT TERM GOAL #4   Title verbalize understanding of correct posture, body mechanics and education regarding osteoprosis   Status Achieved           PT Long Term Goals - 09/02/16 1340      PT LONG TERM GOAL #1   Title be independent in advanced HEP   Time 4   Period Weeks   Status On-going     PT LONG TERM GOAL #2   Title report a 75% improved confidence with gait in the community   Time 4   Period Weeks   Status On-going     PT LONG TERM GOAL #3   Title perform 5x sit to stand in < or = to 12 seconds to improve stabiltiy   Status Achieved     PT LONG TERM GOAL #4   Title report no falls x 4 weeks   Status Achieved               Plan - 09/02/16 1337    Clinical Impression Statement The patient was able to progress single leg standing with dynamic LE movements with  only light UE touches <50% of the time to steady herself.  She reports good compliance with HEP.  Close supervision for safety.   Assess progress toward goals next visit to determine readiness for discharge.     PT Next Visit Plan Gait and balance training.  Postural strength;  review goals       Patient will benefit from skilled therapeutic intervention in order to improve the following deficits and impairments:     Visit Diagnosis: Muscle weakness (generalized)  Difficulty in walking, not elsewhere classified     Problem List Patient Active Problem List   Diagnosis Date Noted  . CAD (coronary artery disease) 02/22/2016  . Unsteadiness 11/20/2015  . PAF (paroxysmal atrial fibrillation) ([place]) 11/06/2015  . Chronic diastolic CHF (congestive heart failure), NYHA class 2 ([place]) 11/06/2015  . S/P MVR (mitral valve repair) 10/10/2015  . Severe mitral regurgitation   . Shortness of breath   . Acute congestive heart failure ([place])   . Left hip pain   . Mitral regurgitation 10/02/2015  . Paroxysmal atrial fibrillation ([place]) 10/01/2015  . Acute respiratory  failure with hypoxia and hypercapnia (HCC)   . Respiratory distress 09/30/2015  .  Acute on chronic diastolic congestive heart failure, NYHA class 3 ([place]) 09/30/2015  . Hx of fracture 04/26/2015  . Diplopia 04/26/2015  . Medicare annual wellness visit, subsequent 07/26/2014  . Diarrhea 03/30/2014  . Rhinitis 10/06/2013  . Recurrent vomiting 10/06/2013  . Hx of vomiting 05/26/2013  . Carotid artery disease without cerebral infarction ([place]) 03/22/2013  . Hypertension, isolated systolic 0000000  . Bereavement 03/22/2013  . Fracture, finger 07/13/2012  . Patellar fracture 07/13/2012  . Left-sided chest wall pain 04/10/2012  . Neuritis 04/10/2012  . Constipation, other cause 03/31/2012  . Scapular fracture 03/31/2012  . Hx of fall 03/31/2012  . Esophageal disorder 11/27/2011  . Atypical chest pain 11/27/2011  . Dizziness 11/27/2011  . Postmenopausal HRT (hormone replacement therapy) 09/23/2011  . Elevated blood pressure 03/25/2011  . Subclavian steal syndrome 09/24/2010  . GAIT IMBALANCE 03/19/2010  . SKIN LESION, UNCERTAIN SIGNIFICANCE 09/18/2009  . SLEEPLESSNESS 03/20/2009  . HYPERGLYCEMIA 03/20/2009  . VITAMIN D DEFICIENCY 11/09/2007  . ALLERGIC RHINITIS 11/09/2007  . LEG PAIN, BILATERAL 11/09/2007  . Hypothyroidism 08/19/2007  . HYPERLIPIDEMIA 08/19/2007  . GERD 08/19/2007  . OSTEOARTHRITIS 08/19/2007  . Osteoporosis 08/19/2007    Alvera Singh 09/02/2016, 1:41 PM  [facility] [address], [place], [place], [address] Phone: [phone]   Fax:  [phone]  Name: Amy Fischer MRN: [id number] Date of Birth: 07/23/1929

## 2016-09-02 NOTE — Therapy (Signed)
[facility] [address], [place], [place], [address] Phone: [phone]   Fax:  [phone]  Physical Therapy Treatment  Patient Details  Name: Amy Fischer MRN: [id number] Date of Birth: 1929-03-31 Referring Provider: Shanon Ace, MD  Encounter Date: 09/02/2016      PT End of Session - 09/02/16 1337    Visit Number 14   Number of Visits 20   Date for PT Re-Evaluation 09/12/16   PT Start Time [id number]   PT Stop Time 1225   PT Time Calculation (min) 40 min   Activity Tolerance Patient tolerated treatment well      Past Medical History:  Diagnosis Date  . Allergy   . Anemia   . Asymptomatic carotid artery stenosis    R ICA 40% stenosis on CTA 12/2011.  . Bladder polyps   . Cataract    BILATERAL-REMOVED  . CHF (congestive heart failure) ([place])   . Diverticulosis   . Elevated blood pressure 03/25/2011   Bp readings borderline today has hx of elvation  In office and ok at home   Not checke recently   She gfeels was elevated from anxiety    . Episodic recurrent vertigo    MRI Head 2003  . Esophageal spasm   . GERD (gastroesophageal reflux disease)   . Hepatic hemangioma   . History of hepatitis    unknown type  . Hyperlipidemia   . Hyperplastic colon polyp   . Hypothyroidism   . Intestinal metaplasia of gastric mucosa   . Osteoarthritis   . Osteoporosis   . Subclavian steal syndrome    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; CTA 2013 revealed open graft    Past Surgical History:  Procedure Laterality Date  . APPENDECTOMY    . CARDIAC CATHETERIZATION N/A 10/04/2015   Procedure: Right/Left Heart Cath and Coronary Angiography;  Surgeon: Belva Crome, MD;  Location: [place] [facility];  Service: Cardiovascular;  Laterality: N/A;  . CAROTID-SUBCLAVIAN BYPASS GRAFT Left 1999   for [place] steal syndrome  . CHOLECYSTECTOMY    . COLONOSCOPY    . CYSTECTOMY Left    hand  . ELBOW SURGERY Left   . KNEE  SURGERY Left 2014  . MITRAL VALVE REPAIR N/A 10/10/2015   Procedure: MITRAL VALVE REPAIR (MVR) WITH SIZE 28 CARPENTIER-EDWARDS PHYSIO II ANNULOPLASTY RING;  Surgeon: [name], MD;  Location: [place];  Service: Open Heart Surgery;  Laterality: N/A;  . ROTATOR CUFF REPAIR Left   . TEAR DUCT PROBING  07/2014  . TEE WITHOUT CARDIOVERSION N/A 10/03/2015   Procedure: TRANSESOPHAGEAL ECHOCARDIOGRAM (TEE);  Surgeon: Larey Dresser, MD;  Location: [place];  Service: Cardiovascular;  Laterality: N/A;  . TEE WITHOUT CARDIOVERSION N/A 10/10/2015   Procedure: TRANSESOPHAGEAL ECHOCARDIOGRAM (TEE);  Surgeon: [name], MD;  Location: [place];  Service: Open Heart Surgery;  Laterality: N/A;  . TONSILLECTOMY    . TUBAL LIGATION      There were no vitals filed for this visit.      Subjective Assessment - 09/02/16 1150    Subjective No pain at present.  "Today I feel like I need a walker b/c I've got a teary eye/blurry vision which throws me off."     Currently in Pain? No/denies   Pain Score 0-No pain                         [facility] Adult  PT Treatment/Exercise - 09/02/16 0001      Knee/Hip Exercises: Aerobic   Nustep Level 2 x 10  minutes   seat 7, arms 9     Knee/Hip Exercises: Standing   Heel Raises 2 sets;10 reps   Lateral Step Up Both;2 sets;10 reps  with opposite hip abduction 10x   Forward Step Up Limitations alternating toe taps 6" step 3x20 seconds   SLS with Vectors 3 ways 5x right/left   Walking with Sports Cord 20# forward and reverse x 10 each   Gait Training --   Other Standing Knee Exercises wall pull-aways 15x   Other Standing Knee Exercises weight shift 4 ways                  PT Short Term Goals - 09/02/16 1340      PT SHORT TERM GOAL #1   Title be independent in initial HEP   Status Achieved     PT SHORT TERM GOAL #2   Title perform single leg stance on Rt and Lt x 4-5 seconds to improve stability   Status Achieved      PT SHORT TERM GOAL #3   Title perform TUG in < or = to 12 seconds to improve stability   Status Achieved     PT SHORT TERM GOAL #4   Title verbalize understanding of correct posture, body mechanics and education regarding osteoprosis   Status Achieved           PT Long Term Goals - 09/02/16 1340      PT LONG TERM GOAL #1   Title be independent in advanced HEP   Time 4   Period Weeks   Status On-going     PT LONG TERM GOAL #2   Title report a 75% improved confidence with gait in the community   Time 4   Period Weeks   Status On-going     PT LONG TERM GOAL #3   Title perform 5x sit to stand in < or = to 12 seconds to improve stabiltiy   Status Achieved     PT LONG TERM GOAL #4   Title report no falls x 4 weeks   Status Achieved               Plan - 09/02/16 1337    Clinical Impression Statement The patient was able to progress single leg standing with dynamic LE movements with  only light UE touches <50% of the time to steady herself.  She reports good compliance with HEP.  Close supervision for safety.   Assess progress toward goals next visit to determine readiness for discharge.     PT Next Visit Plan Gait and balance training.  Postural strength;  review goals       Patient will benefit from skilled therapeutic intervention in order to improve the following deficits and impairments:     Visit Diagnosis: Muscle weakness (generalized)  Difficulty in walking, not elsewhere classified     Problem List Patient Active Problem List   Diagnosis Date Noted  . CAD (coronary artery disease) 02/22/2016  . Unsteadiness 11/20/2015  . PAF (paroxysmal atrial fibrillation) ([place]) 11/06/2015  . Chronic diastolic CHF (congestive heart failure), NYHA class 2 ([place]) 11/06/2015  . S/P MVR (mitral valve repair) 10/10/2015  . Severe mitral regurgitation   . Shortness of breath   . Acute congestive heart failure ([place])   . Left hip pain   . Mitral regurgitation  10/02/2015  .  Paroxysmal atrial fibrillation ([place]) 10/01/2015  . Acute respiratory failure with hypoxia and hypercapnia (HCC)   . Respiratory distress 09/30/2015  . Acute on chronic diastolic congestive heart failure, NYHA class 3 ([place]) 09/30/2015  . Hx of fracture 04/26/2015  . Diplopia 04/26/2015  . Medicare annual wellness visit, subsequent 07/26/2014  . Diarrhea 03/30/2014  . Rhinitis 10/06/2013  . Recurrent vomiting 10/06/2013  . Hx of vomiting 05/26/2013  . Carotid artery disease without cerebral infarction ([place]) 03/22/2013  . Hypertension, isolated systolic 0000000  . Bereavement 03/22/2013  . Fracture, finger 07/13/2012  . Patellar fracture 07/13/2012  . Left-sided chest wall pain 04/10/2012  . Neuritis 04/10/2012  . Constipation, other cause 03/31/2012  . Scapular fracture 03/31/2012  . Hx of fall 03/31/2012  . Esophageal disorder 11/27/2011  . Atypical chest pain 11/27/2011  . Dizziness 11/27/2011  . Postmenopausal HRT (hormone replacement therapy) 09/23/2011  . Elevated blood pressure 03/25/2011  . Subclavian steal syndrome 09/24/2010  . GAIT IMBALANCE 03/19/2010  . SKIN LESION, UNCERTAIN SIGNIFICANCE 09/18/2009  . SLEEPLESSNESS 03/20/2009  . HYPERGLYCEMIA 03/20/2009  . VITAMIN D DEFICIENCY 11/09/2007  . ALLERGIC RHINITIS 11/09/2007  . LEG PAIN, BILATERAL 11/09/2007  . Hypothyroidism 08/19/2007  . HYPERLIPIDEMIA 08/19/2007  . GERD 08/19/2007  . OSTEOARTHRITIS 08/19/2007  . Osteoporosis 08/19/2007   Ruben Im, PT 09/02/16 1:44 PM Phone: [phone] Fax: [phone]  Alvera Singh 09/02/2016, 1:43 PM  [facility] [address], [place], [place], [address] Phone: [phone]   Fax:  [phone]  Name: Amy Fischer MRN: [id number] Date of Birth: 09/29/1929

## 2016-09-03 ENCOUNTER — Ambulatory Visit (HOSPITAL_COMMUNITY): Payer: Medicare Other

## 2016-09-05 ENCOUNTER — Other Ambulatory Visit: Payer: Self-pay

## 2016-09-05 ENCOUNTER — Ambulatory Visit (HOSPITAL_COMMUNITY): Payer: Medicare Other

## 2016-09-05 NOTE — Patient Outreach (Signed)
[place] [facility]) Care Management  [place]  09/05/2016   Amy Fischer 03/11/1929 [id number]  Subjective: Telephone call to patient for monthly call.  Patient reports she is doing ok.  She reports that she continues to therapy and that she has not had any falls since May.  Patient however reports some fear of falling.  Reassured patient to go slow and using assistive devices to prevent falls. She verbalized understanding. She reports her weight stays right around 116 lbs. Discussed with patient heart failure zone chart and when to notify physician.  She verbalized understanding.    Objective:   Encounter Medications:  Outpatient Encounter Prescriptions as of 09/05/2016  Medication Sig Note  . acetaminophen (TYLENOL) 325 MG tablet Take 2 tablets (650 mg total) by mouth every 6 (six) hours as needed for mild pain.   [name] aspirin EC 81 MG EC tablet Take 1 tablet (81 mg total) by mouth daily. (Patient taking differently: Take 81 mg by mouth at bedtime. )   . budesonide-formoterol (SYMBICORT) 160-4.5 MCG/ACT inhaler Inhale 2 puffs into the lungs 2 (two) times daily. Or as directed (Patient taking differently: Inhale 2 puffs into the lungs 2 (two) times daily as needed. Or as directed)   . cholecalciferol (VITAMIN D) 1000 units tablet Take 1,000 Units by mouth daily. 08/08/2016: Will bring bottle in for dosing  . CRESTOR 5 MG tablet TAKE ONE-HALF (1/2) TABLET DAILY   . DiphenhydrAMINE HCl (BENADRYL PO) Take by mouth.   . docusate sodium (COLACE) 100 MG capsule Take 100 mg by mouth daily as needed for mild constipation.   . fish oil-omega-3 fatty acids 1000 MG capsule Take 1 g by mouth daily.    . folic acid (FOLVITE) 1 MG tablet TAKE 1 TABLET DAILY   . glucosamine-chondroitin 500-400 MG tablet Take 1 tablet by mouth daily.    [name] ipratropium (ATROVENT) 0.03 % nasal spray Place 2 sprays into both nostrils 3 (three) times daily as needed for rhinitis.   [name] levothyroxine (SYNTHROID,  LEVOTHROID) 100 MCG tablet TAKE 1 TABLET EVERY MORNING ON AN EMPTY STOMACH   . Melatonin 3 MG TABS Take 3 mg by mouth at bedtime.    . MULTIPLE VITAMIN PO Take 1 tablet by mouth daily. 50+ Senior Vitamin Daily   . omeprazole (PRILOSEC) 40 MG capsule TAKE 1 CAPSULE DAILY (Patient taking differently: TAKE 1 CAPSULE DAILY before dinner)   . Polyethyl Glycol-Propyl Glycol (SYSTANE OP) Place 1 drop into both eyes 2 (two) times daily. Use 1-2 Drops in both eyes   . PROAIR HFA 108 (90 Base) MCG/ACT inhaler Inhale 2 puffs into the lungs every 6 (six) hours as needed for wheezing or shortness of breath   . SALINE NASAL SPRAY NA Place 1 spray into both nostrils 2 (two) times daily as needed (congestion).    . traMADol (ULTRAM) 50 MG tablet Take 1 tablet (50 mg total) by mouth every 6 (six) hours as needed.   . vitamin E 400 UNIT capsule Take 400 Units by mouth daily.   [name] ZETIA 10 MG tablet TAKE 1 TABLET DAILY   . ciprofloxacin (CIPRO) 500 MG tablet Take 1 tablet (500 mg total) by mouth 2 (two) times daily.    No facility-administered encounter medications on file as of 09/05/2016.     Functional Status:  In your present state of health, do you have any difficulty performing the following activities: 08/08/2016  Hearing? N  Vision? N  Difficulty concentrating or making  decisions? N  Walking or climbing stairs? N  Dressing or bathing? N  Doing errands, shopping? N  Preparing Food and eating ? N  Using the Toilet? N  In the past six months, have you accidently leaked urine? Y  Do you have problems with loss of bowel control? N  Managing your Medications? N  Managing your Finances? N  Housekeeping or managing your Housekeeping? N  Some recent data might be hidden    Fall/Depression Screening: PHQ 2/9 Scores 09/05/2016 08/08/2016 08/01/2016 06/23/2016 05/22/2016 04/23/2016 03/21/2016  PHQ - 2 Score 0 0 0 0 0 0 0  PHQ- 9 Score - - - - - - -    Assessment: Patient continues to benefit from health coach  outreach for disease management and support.    Plan:  [facility] CM Care Plan Problem One   Flowsheet Row Most Recent Value  Care Plan Problem One  Heart Failure knowledge deficit  Role Documenting the Problem One  Health Coach  Care Plan for Problem One  Active  THN Long Term Goal (31-90 days)  Patient will verbalize signs and symptoms of heart failure within 90 days.  THN Long Term Goal Start Date  08/01/16 [goal continued]  Interventions for Problem One Long Term Goal  RN Health Coach reinforced signs and symptoms of heart failure with patient.  THN CM Short Term Goal #1 (0-30 days)  Patient will verbalize yellow zone of heart failure chart within 30 days  THN CM Short Term Goal #1 Start Date  09/05/16 [goal continued]  Interventions for Short Term Goal #1  RN Health Coach reinforced yellow zone of heart failure chart with patient.     RN Health Coach will contact patient in the month of October and patient agrees to next outreach.  Jone Baseman, RN, MSN [place] [phone]

## 2016-09-08 ENCOUNTER — Ambulatory Visit (HOSPITAL_COMMUNITY): Payer: Medicare Other

## 2016-09-09 ENCOUNTER — Ambulatory Visit: Payer: Medicare Other

## 2016-09-09 DIAGNOSIS — R262 Difficulty in walking, not elsewhere classified: Secondary | ICD-10-CM

## 2016-09-09 DIAGNOSIS — M6281 Muscle weakness (generalized): Secondary | ICD-10-CM

## 2016-09-09 NOTE — Therapy (Signed)
[facility] [address] [place], [place], [address] Phone: [phone]   Fax:  [phone]  Physical Therapy Treatment  Patient Details  Name: Amy Fischer MRN: [id number] Date of Birth: Apr 17, 1929 Referring Provider: Shanon Ace, MD  Encounter Date: 09/09/2016      Amy Fischer End of Session - 09/09/16 1210    Visit Number 15   Amy Fischer Start Time 0258   Amy Fischer Stop Time 5277   Amy Fischer Time Calculation (min) 38 min   Activity Tolerance Patient tolerated treatment well   Behavior During Therapy [facility] for tasks assessed/performed      Past Medical History:  Diagnosis Date  . Allergy   . Anemia   . Asymptomatic carotid artery stenosis    R ICA 40% stenosis on CTA 12/2011.  . Bladder polyps   . Cataract    BILATERAL-REMOVED  . CHF (congestive heart failure) ([place])   . Diverticulosis   . Elevated blood pressure 03/25/2011   Bp readings borderline today has hx of elvation  In office and ok at home   Not checke recently   She gfeels was elevated from anxiety    . Episodic recurrent vertigo    MRI Head 2003  . Esophageal spasm   . GERD (gastroesophageal reflux disease)   . Hepatic hemangioma   . History of hepatitis    unknown type  . Hyperlipidemia   . Hyperplastic colon polyp   . Hypothyroidism   . Intestinal metaplasia of gastric mucosa   . Osteoarthritis   . Osteoporosis   . Subclavian steal syndrome    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; CTA 2013 revealed open graft    Past Surgical History:  Procedure Laterality Date  . APPENDECTOMY    . CARDIAC CATHETERIZATION N/A 10/04/2015   Procedure: Right/Left Heart Cath and Coronary Angiography;  Surgeon: Belva Crome, MD;  Location: [place] [facility];  Service: Cardiovascular;  Laterality: N/A;  . CAROTID-SUBCLAVIAN BYPASS GRAFT Left 1999   for [place] steal syndrome  . CHOLECYSTECTOMY    . COLONOSCOPY    . CYSTECTOMY Left    hand  . ELBOW SURGERY Left   . KNEE  SURGERY Left 2014  . MITRAL VALVE REPAIR N/A 10/10/2015   Procedure: MITRAL VALVE REPAIR (MVR) WITH SIZE 28 CARPENTIER-EDWARDS PHYSIO II ANNULOPLASTY RING;  Surgeon: [name], MD;  Location: [place];  Service: Open Heart Surgery;  Laterality: N/A;  . ROTATOR CUFF REPAIR Left   . TEAR DUCT PROBING  07/2014  . TEE WITHOUT CARDIOVERSION N/A 10/03/2015   Procedure: TRANSESOPHAGEAL ECHOCARDIOGRAM (TEE);  Surgeon: Larey Dresser, MD;  Location: [place];  Service: Cardiovascular;  Laterality: N/A;  . TEE WITHOUT CARDIOVERSION N/A 10/10/2015   Procedure: TRANSESOPHAGEAL ECHOCARDIOGRAM (TEE);  Surgeon: [name], MD;  Location: [place];  Service: Open Heart Surgery;  Laterality: N/A;  . TONSILLECTOMY    . TUBAL LIGATION      There were no vitals filed for this visit.      Subjective Assessment - 09/09/16 1148    Subjective Amy Fischer is ready to D/C to HEP.  Felt a little off balance over the weekend.     Currently in Pain? No/denies            [facility] Amy Fischer Assessment - 09/09/16 0001      Assessment   Medical Diagnosis Hx of fall, unsteadiness     Prior Function   Level of Independence Independent  Cognition   Overall Cognitive Status Within Functional Limits for tasks assessed     Posture/Postural Control   Posture/Postural Control Postural limitations   Postural Limitations Rounded Shoulders;Forward head;Flexed trunk     Balance   Balance Assessed Yes     High Level Balance   High Level Balance Comments Single leg stance on Rt 13 seconds, Lt 8 seconds                     [facility] Adult Amy Fischer Treatment/Exercise - 10/04/2016 0001      Lumbar Exercises: Aerobic   UBE (Upper Arm Bike) Level 1 x 6 minutes (3/3)   for postural strength and endurance     Lumbar Exercises: Seated   Sit to Stand Limitations 3 way raises: 1# 2x10     Knee/Hip Exercises: Aerobic   Nustep Level 2 x 10  minutes   seat 7, arms 9     Knee/Hip Exercises: Standing   Heel  Raises 2 sets;10 reps   Lateral Step Up Both;2 sets;10 reps  with opposite hip abduction 10x   Forward Step Up Limitations alternating toe taps 6" step 3x20 seconds   Rebounder 3 directions x 1 minute each                  Amy Fischer Short Term Goals - 09/02/16 1340      Amy Fischer SHORT TERM GOAL #1   Title be independent in initial HEP   Status Achieved     Amy Fischer SHORT TERM GOAL #2   Title perform single leg stance on Rt and Lt x 4-5 seconds to improve stability   Status Achieved     Amy Fischer SHORT TERM GOAL #3   Title perform TUG in < or = to 12 seconds to improve stability   Status Achieved     Amy Fischer SHORT TERM GOAL #4   Title verbalize understanding of correct posture, body mechanics and education regarding osteoprosis   Status Achieved           Amy Fischer Long Term Goals - 10/04/2016 1147      Amy Fischer LONG TERM GOAL #1   Title be independent in advanced HEP   Status Achieved     Amy Fischer LONG TERM GOAL #2   Title report a 75% improved confidence with gait in the community   Status Partially Met  50% reported     Amy Fischer LONG TERM GOAL #3   Title perform 5x sit to stand in < or = to 12 seconds to improve stabiltiy   Status Achieved     Amy Fischer LONG TERM GOAL #4   Title report no falls x 4 weeks   Status Achieved               Plan - 10-04-2016 1157    Clinical Impression Statement Amy Fischer will D/C to HEP for continued advancement of strength and balance exercises.  No falls reported at home.  Amy Fischer reports 50% improved confidence with gait and balance.  Single leg stance on the Rt is 13 seconds and LT is 8 seconds today.     Amy Fischer Next Visit Plan D/C Amy Fischer to HEP   Consulted and Agree with Plan of Care Patient      Patient will benefit from skilled therapeutic intervention in order to improve the following deficits and impairments:     Visit Diagnosis: Muscle weakness (generalized)  Difficulty in walking, not elsewhere classified       G-Codes - 10/04/16  1147    Functional Assessment Tool Used 5x  sit to stand, clinical judgement   Functional Limitation Mobility: Walking and moving around   Mobility: Walking and Moving Around Goal Status [phone]) At least 20 percent but less than 40 percent impaired, limited or restricted   Mobility: Walking and Moving Around Discharge Status [phone]) At least 20 percent but less than 40 percent impaired, limited or restricted      Problem List Patient Active Problem List   Diagnosis Date Noted  . CAD (coronary artery disease) 02/22/2016  . Unsteadiness 11/20/2015  . PAF (paroxysmal atrial fibrillation) ([place]) 11/06/2015  . Chronic diastolic CHF (congestive heart failure), NYHA class 2 ([place]) 11/06/2015  . S/P MVR (mitral valve repair) 10/10/2015  . Severe mitral regurgitation   . Shortness of breath   . Acute congestive heart failure ([place])   . Left hip pain   . Mitral regurgitation 10/02/2015  . Paroxysmal atrial fibrillation ([place]) 10/01/2015  . Acute respiratory failure with hypoxia and hypercapnia (HCC)   . Respiratory distress 09/30/2015  . Acute on chronic diastolic congestive heart failure, NYHA class 3 ([place]) 09/30/2015  . Hx of fracture 04/26/2015  . Diplopia 04/26/2015  . Medicare annual wellness visit, subsequent 07/26/2014  . Diarrhea 03/30/2014  . Rhinitis 10/06/2013  . Recurrent vomiting 10/06/2013  . Hx of vomiting 05/26/2013  . Carotid artery disease without cerebral infarction ([place]) 03/22/2013  . Hypertension, isolated systolic 09/98/3382  . Bereavement 03/22/2013  . Fracture, finger 07/13/2012  . Patellar fracture 07/13/2012  . Left-sided chest wall pain 04/10/2012  . Neuritis 04/10/2012  . Constipation, other cause 03/31/2012  . Scapular fracture 03/31/2012  . Hx of fall 03/31/2012  . Esophageal disorder 11/27/2011  . Atypical chest pain 11/27/2011  . Dizziness 11/27/2011  . Postmenopausal HRT (hormone replacement therapy) 09/23/2011  . Elevated blood pressure 03/25/2011  . Subclavian steal syndrome 09/24/2010  .  GAIT IMBALANCE 03/19/2010  . SKIN LESION, UNCERTAIN SIGNIFICANCE 09/18/2009  . SLEEPLESSNESS 03/20/2009  . HYPERGLYCEMIA 03/20/2009  . VITAMIN D DEFICIENCY 11/09/2007  . ALLERGIC RHINITIS 11/09/2007  . LEG PAIN, BILATERAL 11/09/2007  . Hypothyroidism 08/19/2007  . HYPERLIPIDEMIA 08/19/2007  . GERD 08/19/2007  . OSTEOARTHRITIS 08/19/2007  . Osteoporosis 08/19/2007   PHYSICAL THERAPY DISCHARGE SUMMARY  Visits from Start of Care: 15  Current functional level related to goals / functional outcomes: See above for most current status.  Amy Fischer has HEP in place for balance and strength gains.     Remaining deficits: See above.  Amy Fischer with instability with gait at times.  No recent falls.     Education / Equipment: HEP, fall prevention Plan: Patient agrees to discharge.  Patient goals were met. Patient is being discharged due to meeting the stated rehab goals.  ?????        Amy Fischer, Amy Fischer 09/09/16 12:11 PM  [facility] [address] [place], [place], [address] Phone: [phone]   Fax:  [phone]  Name: Amy Fischer MRN: [id number] Date of Birth: [birth year]

## 2016-09-10 ENCOUNTER — Ambulatory Visit (HOSPITAL_COMMUNITY): Payer: Medicare Other

## 2016-09-23 ENCOUNTER — Other Ambulatory Visit: Payer: Self-pay | Admitting: Internal Medicine

## 2016-09-24 DIAGNOSIS — M25561 Pain in right knee: Secondary | ICD-10-CM | POA: Diagnosis not present

## 2016-09-24 DIAGNOSIS — S8981XA Other specified injuries of right lower leg, initial encounter: Secondary | ICD-10-CM | POA: Diagnosis not present

## 2016-09-24 DIAGNOSIS — S8001XA Contusion of right knee, initial encounter: Secondary | ICD-10-CM | POA: Diagnosis not present

## 2016-09-24 NOTE — Telephone Encounter (Signed)
Last OV 03/25/16... Last TSH lab 02/15/16... Next scheduled OV 10/14/16 ... Okay to refill?

## 2016-09-24 NOTE — Telephone Encounter (Signed)
Ok to refill   For 6 months

## 2016-09-27 ENCOUNTER — Other Ambulatory Visit: Payer: Self-pay | Admitting: Internal Medicine

## 2016-09-29 ENCOUNTER — Other Ambulatory Visit: Payer: Self-pay

## 2016-09-29 NOTE — Telephone Encounter (Signed)
[place]

## 2016-09-29 NOTE — Telephone Encounter (Signed)
Rx zetia 10 mg sent to pharmacy

## 2016-10-01 ENCOUNTER — Other Ambulatory Visit: Payer: Self-pay

## 2016-10-01 NOTE — Patient Outreach (Signed)
[place] [facility]) Care Management  10/01/2016  Amy Fischer [birth year] [id number]   Telephone call to patient for monthly call. No answer HIPAA compliant voice message left.    Plan: RN Health Coach will attempt patient again in the month of October.  Jone Baseman, RN, MSN [place] [phone]

## 2016-10-02 ENCOUNTER — Ambulatory Visit: Payer: Self-pay

## 2016-10-10 DIAGNOSIS — M25461 Effusion, right knee: Secondary | ICD-10-CM | POA: Diagnosis not present

## 2016-10-10 DIAGNOSIS — M25561 Pain in right knee: Secondary | ICD-10-CM | POA: Diagnosis not present

## 2016-10-13 NOTE — Progress Notes (Signed)
Pre visit review using our clinic review tool, if applicable. No additional management support is needed unless otherwise documented below in the visit note.  Chief Complaint  Patient presents with  . Follow-up    HPI: Amy Fischer 82 y.o.  comesin for fu  4 mos fu after fall  For balance  Has had uti in the interim   Had pt just finished for reaching goal .  Fall in a knee contusion and saw Dr. Drema Dallas. She says "the Prolia must be working because I didn't get her fracture". The fall was mostly related doing her exercises and turning and she just fell. Is a bit frustrated that this happened. She has her walker today and is a social person likes to get out but now is beginning to be concerned that she might fall when she goes out. Blood pressures been good no change in medicine she does take short-acting Benadryl at night to help open her nose to breathe.. tried off of it and her nose got very stuffy. Doesn't feel like a hangover the next day. Seeing eye doctor having dry eyes and tearing is but doesn't think that's the cause of falling. resp no change  Thyroid med  No change No cp sob change pnd  ROS: See pertinent positives and negatives per HPI. cv  Stable per patient   Past Medical History:  Diagnosis Date  . Allergy   . Anemia   . Asymptomatic carotid artery stenosis    R ICA 40% stenosis on CTA 12/2011.  . Bladder polyps   . Cataract    BILATERAL-REMOVED  . CHF (congestive heart failure) ([place])   . Diverticulosis   . Elevated blood pressure 03/25/2011   Bp readings borderline today has hx of elvation  In office and ok at home   Not checke recently   She gfeels was elevated from anxiety    . Episodic recurrent vertigo    MRI Head 2003  . Esophageal spasm   . GERD (gastroesophageal reflux disease)   . Hepatic hemangioma   . History of hepatitis    unknown type  . Hyperlipidemia   . Hyperplastic colon polyp   . Hypothyroidism   . Intestinal metaplasia of gastric mucosa    . Osteoarthritis   . Osteoporosis   . Patellar fracture 07/13/2012  . Scapular fracture 03/31/2012   small from direct blow with trip    . Subclavian steal syndrome    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; CTA 2013 revealed open graft    Family History  Problem Relation Age of Onset  . Hypertension Mother   . Stroke Mother   . Heart disease Father   . Colon cancer Neg Hx     Social History   Social History  . Marital status: Widowed    Spouse name: N/A  . Number of children: 4  . Years of education: N/A   Occupational History  . retired    Social History Main Topics  . Smoking status: Former Smoker    Packs/day: 0.75    Years: 20.00    Quit date: 01/05/1991  . Smokeless tobacco: Never Used  . Alcohol use 2.4 oz/week    4 Glasses of wine per week     Comment: socially  . Drug use: No  . Sexual activity: Not Asked   Other Topics Concern  . None   Social History Narrative   Widowed   HH of 1    No  pets   Former smoker   Exercises regularly    Outpatient Medications Prior to Visit  Medication Sig Dispense Refill  . acetaminophen (TYLENOL) 325 MG tablet Take 2 tablets (650 mg total) by mouth every 6 (six) hours as needed for mild pain.    [name] aspirin EC 81 MG EC tablet Take 1 tablet (81 mg total) by mouth daily. (Patient taking differently: Take 81 mg by mouth at bedtime. )    . budesonide-formoterol (SYMBICORT) 160-4.5 MCG/ACT inhaler Inhale 2 puffs into the lungs 2 (two) times daily. Or as directed (Patient taking differently: Inhale 2 puffs into the lungs 2 (two) times daily as needed. Or as directed) 1 Inhaler 2  . cholecalciferol (VITAMIN D) 1000 units tablet Take 1,000 Units by mouth daily.    . CRESTOR 5 MG tablet TAKE ONE-HALF (1/2) TABLET DAILY 90 tablet 1  . DiphenhydrAMINE HCl (BENADRYL PO) Take by mouth.    . docusate sodium (COLACE) 100 MG capsule Take 100 mg by mouth daily as needed for mild constipation.    . fish oil-omega-3 fatty acids 1000 MG  capsule Take 1 g by mouth daily.     . folic acid (FOLVITE) 1 MG tablet TAKE 1 TABLET DAILY 90 tablet 1  . glucosamine-chondroitin 500-400 MG tablet Take 1 tablet by mouth daily.     [name] ipratropium (ATROVENT) 0.03 % nasal spray Place 2 sprays into both nostrils 3 (three) times daily as needed for rhinitis. 90 mL 1  . levothyroxine (SYNTHROID, LEVOTHROID) 100 MCG tablet TAKE 1 TABLET EVERY MORNING ON AN EMPTY STOMACH 90 tablet 1  . Melatonin 3 MG TABS Take 3 mg by mouth at bedtime.     . MULTIPLE VITAMIN PO Take 1 tablet by mouth daily. 50+ Senior Vitamin Daily    . omeprazole (PRILOSEC) 40 MG capsule TAKE 1 CAPSULE DAILY (Patient taking differently: TAKE 1 CAPSULE DAILY before dinner) 90 capsule 1  . Polyethyl Glycol-Propyl Glycol (SYSTANE OP) Place 1 drop into both eyes 2 (two) times daily. Use 1-2 Drops in both eyes    . PROAIR HFA 108 (90 Base) MCG/ACT inhaler Inhale 2 puffs into the lungs every 6 (six) hours as needed for wheezing or shortness of breath 1 Inhaler 2  . SALINE NASAL SPRAY NA Place 1 spray into both nostrils 2 (two) times daily as needed (congestion).     . traMADol (ULTRAM) 50 MG tablet Take 1 tablet (50 mg total) by mouth every 6 (six) hours as needed. 15 tablet 0  . vitamin E 400 UNIT capsule Take 400 Units by mouth daily.    [name] ZETIA 10 MG tablet TAKE 1 TABLET DAILY 90 tablet 1  . ciprofloxacin (CIPRO) 500 MG tablet Take 1 tablet (500 mg total) by mouth 2 (two) times daily. 6 tablet 0   No facility-administered medications prior to visit.      EXAM:  BP 138/70   Temp 97.6 F (36.4 C) (Oral)   Ht 5\' 2"  (1.575 m)   Wt 118 lb 12.8 oz (53.9 kg)   BMI 21.73 kg/m   Body mass index is 21.73 kg/m.  GENERAL: vitals reviewed and listed above, alert, oriented, appears well hydrated and in no acute distress wellgroomed in nad  Had walker ut independent HEENT: atraumatic, conjunctiva  clear, no obvious abnormalities on inspection of external nose and ears  NECK: no obvious  masses on inspection palpation  LUNGS: clear to auscultation bilaterally, no wheezes, rales or rhonchi,  CV: HR irreg  intermittnet  no clubbing cyanosis or  peripheral edema nl cap refill    MS: moves all extremities without noticeable focal  Abnormality  DJD changes   PSYCH: pleasant and cooperative, no obvious depression or anxiety  ...cognition intact  Lab Results  Component Value Date   WBC 6.8 02/16/2016   HGB 13.4 02/16/2016   HCT 41.9 02/16/2016   PLT 197 02/16/2016   GLUCOSE 101 (H) 04/29/2016   CHOL 149 12/04/2015   TRIG 110.0 12/04/2015   HDL 54.40 12/04/2015   LDLCALC 72 12/04/2015   ALT 11 (L) 10/15/2015   AST 15 10/15/2015   NA 140 04/29/2016   K 4.3 04/29/2016   CL 105 04/29/2016   CREATININE 0.74 04/29/2016   BUN 16 04/29/2016   CO2 25 04/29/2016   TSH 0.94 02/15/2016   INR 1.02 01/12/2016   HGBA1C 5.1 10/14/2016   Wt Readings from Last 3 Encounters:  10/14/16 118 lb 12.8 oz (53.9 kg)  08/08/16 118 lb 9.6 oz (53.8 kg)  08/08/16 118 lb 3 oz (53.6 kg)    ASSESSMENT AND PLAN:  Discussed the following assessment and plan:  Hyperglycemia - Plan: POCT A1C  Unsteadiness  Hypertension, isolated systolic  Hx of fall  Advanced age  Chronic diastolic CHF (congestive heart failure), NYHA class 2 (HCC)  PAF (paroxysmal atrial fibrillation) (HCC) Doing well except for the fall even under exercises physical therapy discussed prevention assistance on uneven ground. It is frustrating for her. Continue her presently her blood sugar is in a good range okay to have a little ice cream to gain weight. Follow-up in February labs at that visit if needed. Total visit 18mins > 50% spent counseling and coordinating care as indicated in above note and in instructions to patient .   -Patient advised to return or notify health care team  if symptoms worsen ,persist or new concerns arise.  Patient Instructions  bp is good today .    Continue fall prevention  And prolia .    Plan  OV in February and  We can do labs at that visit as needed .    [name]. Storey Stangeland M.D.

## 2016-10-14 ENCOUNTER — Ambulatory Visit (INDEPENDENT_AMBULATORY_CARE_PROVIDER_SITE_OTHER): Payer: Medicare Other | Admitting: Internal Medicine

## 2016-10-14 ENCOUNTER — Encounter: Payer: Self-pay | Admitting: Internal Medicine

## 2016-10-14 VITALS — BP 138/70 | Temp 97.6°F | Ht 62.0 in | Wt 118.8 lb

## 2016-10-14 DIAGNOSIS — I480 Paroxysmal atrial fibrillation: Secondary | ICD-10-CM

## 2016-10-14 DIAGNOSIS — R54 Age-related physical debility: Secondary | ICD-10-CM

## 2016-10-14 DIAGNOSIS — R739 Hyperglycemia, unspecified: Secondary | ICD-10-CM

## 2016-10-14 DIAGNOSIS — Z9181 History of falling: Secondary | ICD-10-CM

## 2016-10-14 DIAGNOSIS — I2583 Coronary atherosclerosis due to lipid rich plaque: Secondary | ICD-10-CM

## 2016-10-14 DIAGNOSIS — I2510 Atherosclerotic heart disease of native coronary artery without angina pectoris: Secondary | ICD-10-CM

## 2016-10-14 DIAGNOSIS — I10 Essential (primary) hypertension: Secondary | ICD-10-CM

## 2016-10-14 DIAGNOSIS — R2681 Unsteadiness on feet: Secondary | ICD-10-CM | POA: Diagnosis not present

## 2016-10-14 DIAGNOSIS — I5032 Chronic diastolic (congestive) heart failure: Secondary | ICD-10-CM

## 2016-10-14 LAB — POCT GLYCOSYLATED HEMOGLOBIN (HGB A1C): Hemoglobin A1C: 5.1

## 2016-10-14 NOTE — Patient Instructions (Signed)
bp is good today .    Continue fall prevention  And prolia .   Plan  OV in February and  We can do labs at that visit as needed .

## 2016-10-23 ENCOUNTER — Other Ambulatory Visit: Payer: Self-pay

## 2016-10-23 NOTE — Patient Outreach (Signed)
[facility]) Care Management  [place]  10/23/2016   Amy Fischer 24-Jan-1929 [id number]  Subjective: Telephone call to patient for monthly call. Patient reports she is doing better. She reports a fall about 5 weeks ago and that she saw her primary care doctor.  She states she was using a walker but is better and is using a cane now for ambulation. Discussed with patient fall prevention and utilizing appropriate devices. She verbalized understanding. Patient denies weight gain or shortness of breath.  Discussed with patient heart failure zones and when to notify physician.  She verbalized understanding.    Objective:   Encounter Medications:  Outpatient Encounter Prescriptions as of 10/23/2016  Medication Sig Note  . acetaminophen (TYLENOL) 325 MG tablet Take 2 tablets (650 mg total) by mouth every 6 (six) hours as needed for mild pain.   [name] aspirin EC 81 MG EC tablet Take 1 tablet (81 mg total) by mouth daily. (Patient taking differently: Take 81 mg by mouth at bedtime. )   . budesonide-formoterol (SYMBICORT) 160-4.5 MCG/ACT inhaler Inhale 2 puffs into the lungs 2 (two) times daily. Or as directed (Patient taking differently: Inhale 2 puffs into the lungs 2 (two) times daily as needed. Or as directed)   . cholecalciferol (VITAMIN D) 1000 units tablet Take 1,000 Units by mouth daily. 08/08/2016: Will bring bottle in for dosing  . CRESTOR 5 MG tablet TAKE ONE-HALF (1/2) TABLET DAILY   . DiphenhydrAMINE HCl (BENADRYL PO) Take by mouth.   . docusate sodium (COLACE) 100 MG capsule Take 100 mg by mouth daily as needed for mild constipation.   . fish oil-omega-3 fatty acids 1000 MG capsule Take 1 g by mouth daily.    . folic acid (FOLVITE) 1 MG tablet TAKE 1 TABLET DAILY   . glucosamine-chondroitin 500-400 MG tablet Take 1 tablet by mouth daily.    [name] ipratropium (ATROVENT) 0.03 % nasal spray Place 2 sprays into both nostrils 3 (three) times daily as needed for rhinitis.    [name] levothyroxine (SYNTHROID, LEVOTHROID) 100 MCG tablet TAKE 1 TABLET EVERY MORNING ON AN EMPTY STOMACH   . Melatonin 3 MG TABS Take 3 mg by mouth at bedtime.    . MULTIPLE VITAMIN PO Take 1 tablet by mouth daily. 50+ Senior Vitamin Daily   . omeprazole (PRILOSEC) 40 MG capsule TAKE 1 CAPSULE DAILY (Patient taking differently: TAKE 1 CAPSULE DAILY before dinner)   . Polyethyl Glycol-Propyl Glycol (SYSTANE OP) Place 1 drop into both eyes 2 (two) times daily. Use 1-2 Drops in both eyes   . PROAIR HFA 108 (90 Base) MCG/ACT inhaler Inhale 2 puffs into the lungs every 6 (six) hours as needed for wheezing or shortness of breath   . SALINE NASAL SPRAY NA Place 1 spray into both nostrils 2 (two) times daily as needed (congestion).    . traMADol (ULTRAM) 50 MG tablet Take 1 tablet (50 mg total) by mouth every 6 (six) hours as needed.   . vitamin E 400 UNIT capsule Take 400 Units by mouth daily.   [name] ZETIA 10 MG tablet TAKE 1 TABLET DAILY    No facility-administered encounter medications on file as of 10/23/2016.     Functional Status:  In your present state of health, do you have any difficulty performing the following activities: 08/08/2016  Hearing? N  Vision? N  Difficulty concentrating or making decisions? N  Walking or climbing stairs? N  Dressing or bathing? N  Doing errands, shopping?  N  Preparing Food and eating ? N  Using the Toilet? N  In the past six months, have you accidently leaked urine? Y  Do you have problems with loss of bowel control? N  Managing your Medications? N  Managing your Finances? N  Housekeeping or managing your Housekeeping? N  Some recent data might be hidden    Fall/Depression Screening: PHQ 2/9 Scores 10/23/2016 09/05/2016 08/08/2016 08/01/2016 06/23/2016 05/22/2016 04/23/2016  PHQ - 2 Score 0 0 0 0 0 0 0  PHQ- 9 Score - - - - - - -    Assessment: Patient continues to benefit from health coach outreach for disease management and support.    Plan:  [facility] CM Care  Plan Problem One   Flowsheet Row Most Recent Value  Care Plan Problem One  Heart Failure knowledge deficit  Role Documenting the Problem One  Health Coach  Care Plan for Problem One  Active  THN Long Term Goal (31-90 days)  Patient will verbalize signs and symptoms of heart failure within 90 days.  THN Long Term Goal Start Date  10/23/16 [goal continued]  Interventions for Problem One Long Term Goal  RN Health Coach reviewed signs and symptoms of heart failure with patient.  THN CM Short Term Goal #1 (0-30 days)  Patient will verbalize yellow zone of heart failure chart within 30 days  THN CM Short Term Goal #1 Start Date  09/05/16 [goal continued]  Interventions for Short Term Goal #1  RN Health Coach reinforced yellow zone of heart failure chart with patient.  THN CM Short Term Goal #2 (0-30 days)  Patient will verbalize signs of heart failure red zone within the next 30 days.   THN CM Short Term Goal #2 Start Date  10/23/16  Interventions for Short Term Goal #2  [place] discussed with patient red zone of heart failure chart.       RN Health Coach will contact patient in the month of November and patient agrees to next outreach.  Jone Baseman, RN, MSN [place] [phone]

## 2016-11-13 DIAGNOSIS — Z961 Presence of intraocular lens: Secondary | ICD-10-CM | POA: Diagnosis not present

## 2016-11-13 DIAGNOSIS — H532 Diplopia: Secondary | ICD-10-CM | POA: Diagnosis not present

## 2016-11-13 DIAGNOSIS — H524 Presbyopia: Secondary | ICD-10-CM | POA: Diagnosis not present

## 2016-11-14 ENCOUNTER — Other Ambulatory Visit: Payer: Self-pay

## 2016-11-14 NOTE — Patient Outreach (Signed)
[facility]) Care Management  [place]  11/14/2016   Amy Fischer 1929/04/03 [id number]  Subjective: Telephone call to patient for monthly call.  Patient reports she is doing ok.  She reports she started water exercise per orthopedic recommendation to help with balance.  Patient states she has done good with the exercise.  Patient reports seeing eye doctor on yesterday and that went well. She reports for her double vision she was having the eye doctor put a prism in her eyeglasses and that has helped.  She reports no falls since last conversation.  Reiterated fall precautions.  Patient denies shortness of breath or swelling.  Discussed with patient heart failure and when to notify physician.  She verbalized understanding.    Objective:   Encounter Medications:  Outpatient Encounter Prescriptions as of 11/14/2016  Medication Sig Note  . acetaminophen (TYLENOL) 325 MG tablet Take 2 tablets (650 mg total) by mouth every 6 (six) hours as needed for mild pain.   [name] aspirin EC 81 MG EC tablet Take 1 tablet (81 mg total) by mouth daily. (Patient taking differently: Take 81 mg by mouth at bedtime. )   . budesonide-formoterol (SYMBICORT) 160-4.5 MCG/ACT inhaler Inhale 2 puffs into the lungs 2 (two) times daily. Or as directed (Patient taking differently: Inhale 2 puffs into the lungs 2 (two) times daily as needed. Or as directed)   . cholecalciferol (VITAMIN D) 1000 units tablet Take 1,000 Units by mouth daily. 08/08/2016: Will bring bottle in for dosing  . CRESTOR 5 MG tablet TAKE ONE-HALF (1/2) TABLET DAILY   . DiphenhydrAMINE HCl (BENADRYL PO) Take by mouth.   . docusate sodium (COLACE) 100 MG capsule Take 100 mg by mouth daily as needed for mild constipation.   . fish oil-omega-3 fatty acids 1000 MG capsule Take 1 g by mouth daily.    . folic acid (FOLVITE) 1 MG tablet TAKE 1 TABLET DAILY   . glucosamine-chondroitin 500-400 MG tablet Take 1 tablet by mouth daily.    [name]  ipratropium (ATROVENT) 0.03 % nasal spray Place 2 sprays into both nostrils 3 (three) times daily as needed for rhinitis.   [name] levothyroxine (SYNTHROID, LEVOTHROID) 100 MCG tablet TAKE 1 TABLET EVERY MORNING ON AN EMPTY STOMACH   . Melatonin 3 MG TABS Take 3 mg by mouth at bedtime.    . MULTIPLE VITAMIN PO Take 1 tablet by mouth daily. 50+ Senior Vitamin Daily   . omeprazole (PRILOSEC) 40 MG capsule TAKE 1 CAPSULE DAILY (Patient taking differently: TAKE 1 CAPSULE DAILY before dinner)   . Polyethyl Glycol-Propyl Glycol (SYSTANE OP) Place 1 drop into both eyes 2 (two) times daily. Use 1-2 Drops in both eyes   . PROAIR HFA 108 (90 Base) MCG/ACT inhaler Inhale 2 puffs into the lungs every 6 (six) hours as needed for wheezing or shortness of breath   . SALINE NASAL SPRAY NA Place 1 spray into both nostrils 2 (two) times daily as needed (congestion).    . traMADol (ULTRAM) 50 MG tablet Take 1 tablet (50 mg total) by mouth every 6 (six) hours as needed.   . vitamin E 400 UNIT capsule Take 400 Units by mouth daily.   [name] ZETIA 10 MG tablet TAKE 1 TABLET DAILY    No facility-administered encounter medications on file as of 11/14/2016.     Functional Status:  In your present state of health, do you have any difficulty performing the following activities: 08/08/2016  Hearing? N  Vision?  N  Difficulty concentrating or making decisions? N  Walking or climbing stairs? N  Dressing or bathing? N  Doing errands, shopping? N  Preparing Food and eating ? N  Using the Toilet? N  In the past six months, have you accidently leaked urine? Y  Do you have problems with loss of bowel control? N  Managing your Medications? N  Managing your Finances? N  Housekeeping or managing your Housekeeping? N  Some recent data might be hidden    Fall/Depression Screening: PHQ 2/9 Scores 11/14/2016 10/23/2016 09/05/2016 08/08/2016 08/01/2016 06/23/2016 05/22/2016  PHQ - 2 Score 0 0 0 0 0 0 0  PHQ- 9 Score - - - - - - -     Assessment: Patient continues to benefit from health coach outreach for disease management and support.    Plan:  [facility] CM Care Plan Problem One   Flowsheet Row Most Recent Value  Care Plan Problem One  Heart Failure knowledge deficit  Role Documenting the Problem One  Health Coach  Care Plan for Problem One  Active  THN Long Term Goal (31-90 days)  Patient will verbalize signs and symptoms of heart failure within 90 days.  THN Long Term Goal Start Date  10/23/16 [goal continued]  Interventions for Problem One Long Term Goal  RN Health Coach discussed signs and symptoms of heart failure with patient.  THN CM Short Term Goal #1 (0-30 days)  Patient will verbalize yellow zone of heart failure chart within 30 days  THN CM Short Term Goal #1 Start Date  09/05/16  [facility] CM Short Term Goal #1 Met Date  11/14/16  Interventions for Short Term Goal #1  Patient able to describe yellow zone.   THN CM Short Term Goal #2 (0-30 days)  Patient will verbalize signs of heart failure red zone within the next 30 days.   THN CM Short Term Goal #2 Start Date  11/14/16  Interventions for Short Term Goal #2  RN Health Coach ed with patient red zone of heart failure chart.       RN Health Coach will contact patient in the month of December and patient agrees to next outreach.  Jone Baseman, RN, MSN [place] [phone]

## 2016-12-11 ENCOUNTER — Other Ambulatory Visit: Payer: Self-pay

## 2016-12-11 NOTE — Patient Outreach (Signed)
[facility]) Care Management  [place]  12/11/2016   Amy Fischer March 16, 1929 [id number]  Subjective: Telephone call to patient for monthly call. Patient reports she is doing good.  Patient reports she is about to go out.  Patient denies any problems. Patient reports no falls since last conversation.  She states she is still doing walking in the water at the gym to help with balance per physician recommendation.  Discussed with patient heart failure and when to notify physician.  She verbalized understanding.    Objective:   Encounter Medications:  Outpatient Encounter Prescriptions as of 12/11/2016  Medication Sig Note  . acetaminophen (TYLENOL) 325 MG tablet Take 2 tablets (650 mg total) by mouth every 6 (six) hours as needed for mild pain.   [name] aspirin EC 81 MG EC tablet Take 1 tablet (81 mg total) by mouth daily. (Patient taking differently: Take 81 mg by mouth at bedtime. )   . budesonide-formoterol (SYMBICORT) 160-4.5 MCG/ACT inhaler Inhale 2 puffs into the lungs 2 (two) times daily. Or as directed (Patient taking differently: Inhale 2 puffs into the lungs 2 (two) times daily as needed. Or as directed)   . cholecalciferol (VITAMIN D) 1000 units tablet Take 1,000 Units by mouth daily. 08/08/2016: Will bring bottle in for dosing  . CRESTOR 5 MG tablet TAKE ONE-HALF (1/2) TABLET DAILY   . DiphenhydrAMINE HCl (BENADRYL PO) Take by mouth.   . docusate sodium (COLACE) 100 MG capsule Take 100 mg by mouth daily as needed for mild constipation.   . fish oil-omega-3 fatty acids 1000 MG capsule Take 1 g by mouth daily.    . folic acid (FOLVITE) 1 MG tablet TAKE 1 TABLET DAILY   . glucosamine-chondroitin 500-400 MG tablet Take 1 tablet by mouth daily.    [name] ipratropium (ATROVENT) 0.03 % nasal spray Place 2 sprays into both nostrils 3 (three) times daily as needed for rhinitis.   [name] levothyroxine (SYNTHROID, LEVOTHROID) 100 MCG tablet TAKE 1 TABLET EVERY MORNING ON AN EMPTY  STOMACH   . Melatonin 3 MG TABS Take 3 mg by mouth at bedtime.    [name] omeprazole (PRILOSEC) 40 MG capsule TAKE 1 CAPSULE DAILY (Patient taking differently: TAKE 1 CAPSULE DAILY before dinner)   . Polyethyl Glycol-Propyl Glycol (SYSTANE OP) Place 1 drop into both eyes 2 (two) times daily. Use 1-2 Drops in both eyes   . PROAIR HFA 108 (90 Base) MCG/ACT inhaler Inhale 2 puffs into the lungs every 6 (six) hours as needed for wheezing or shortness of breath   . SALINE NASAL SPRAY NA Place 1 spray into both nostrils 2 (two) times daily as needed (congestion).    . vitamin E 400 UNIT capsule Take 400 Units by mouth daily.   [name] ZETIA 10 MG tablet TAKE 1 TABLET DAILY   . MULTIPLE VITAMIN PO Take 1 tablet by mouth daily. 50+ Senior Vitamin Daily   . traMADol (ULTRAM) 50 MG tablet Take 1 tablet (50 mg total) by mouth every 6 (six) hours as needed. (Patient not taking: Reported on 12/11/2016)    No facility-administered encounter medications on file as of 12/11/2016.     Functional Status:  In your present state of health, do you have any difficulty performing the following activities: 08/08/2016  Hearing? N  Vision? N  Difficulty concentrating or making decisions? N  Walking or climbing stairs? N  Dressing or bathing? N  Doing errands, shopping? N  Preparing Food and eating ? N  Using the Toilet? N  In the past six months, have you accidently leaked urine? Y  Do you have problems with loss of bowel control? N  Managing your Medications? N  Managing your Finances? N  Housekeeping or managing your Housekeeping? N  Some recent data might be hidden    Fall/Depression Screening: PHQ 2/9 Scores 12/11/2016 11/14/2016 10/23/2016 09/05/2016 08/08/2016 08/01/2016 06/23/2016  PHQ - 2 Score 0 0 0 0 0 0 0  PHQ- 9 Score - - - - - - -    Assessment: Patient continues to benefit from health coach outreach for disease management and support.    Plan:  [facility] CM Care Plan Problem One   Flowsheet Row Most Recent  Value  Care Plan Problem One  Heart Failure knowledge deficit  Role Documenting the Problem One  Health Coach  Care Plan for Problem One  Active  THN Long Term Goal (31-90 days)  Patient will verbalize signs and symptoms of heart failure within 90 days.  THN Long Term Goal Start Date  12/11/16 [goal continued]  Interventions for Problem One Long Term Goal  RN Health Coach reviewed signs and symptoms of heart failure with patient.  THN CM Short Term Goal #2 (0-30 days)  Patient will verbalize signs of heart failure red zone within the next 30 days.   THN CM Short Term Goal #2 Start Date  12/11/16 [goal continued]  Interventions for Short Term Goal #2  [place] reviewed with patient red zone of heart failure chart.       RN Health Coach will contact patient in the month of January and patient agrees to next outreach.  Jone Baseman, RN, MSN [place] [phone]

## 2016-12-26 IMAGING — CT CT ANGIO CHEST
2 of 6 series · 19 of 36 positions shown · IV contrast (Omni 300)
Comparison: Chest radiograph September 30, 2015

CLINICAL DATA: Shortness of breath and decreased oxygen saturation

EXAM:
CT ANGIOGRAPHY CHEST WITH CONTRAST
TECHNIQUE: Multidetector CT imaging of the chest was performed using the
standard protocol during bolus administration of intravenous
contrast. Multiplanar CT image reconstructions and MIPs were
obtained to evaluate the vascular anatomy.
CONTRAST:  60mL OMNIPAQUE IOHEXOL 350 MG/ML SOLN

[Series 7: pe thins · axial · 0.63mm/px · z∈[+1279,+1551]mm · 18 of 601 slices shown]
[im 29/601  lung]
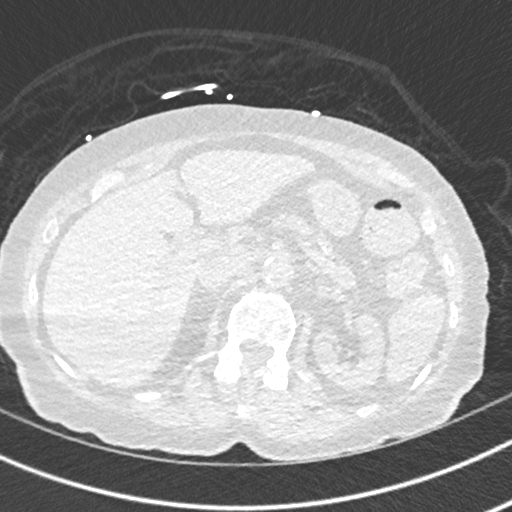
[im 58/601  mediastinal]
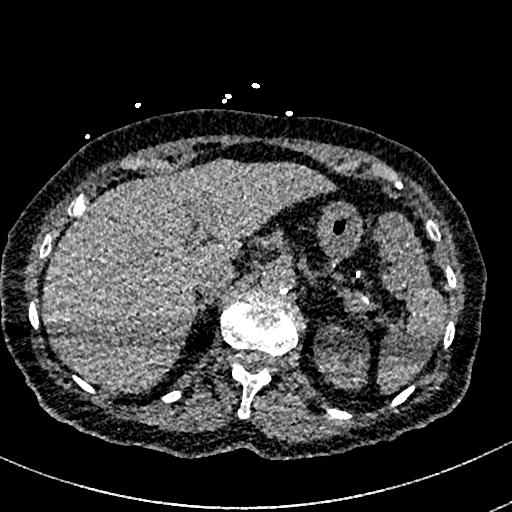
[im 86/601  lung]
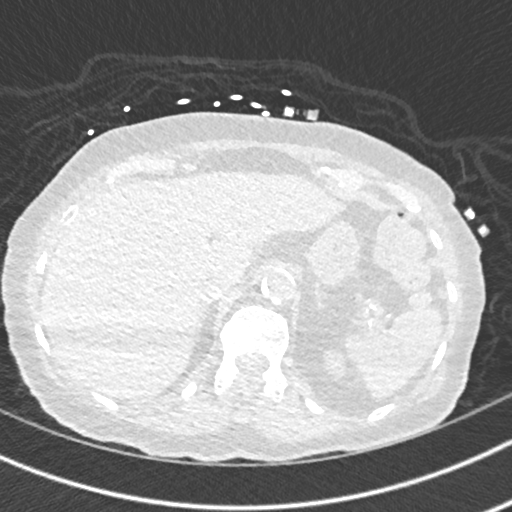
[im 115/601  mediastinal]
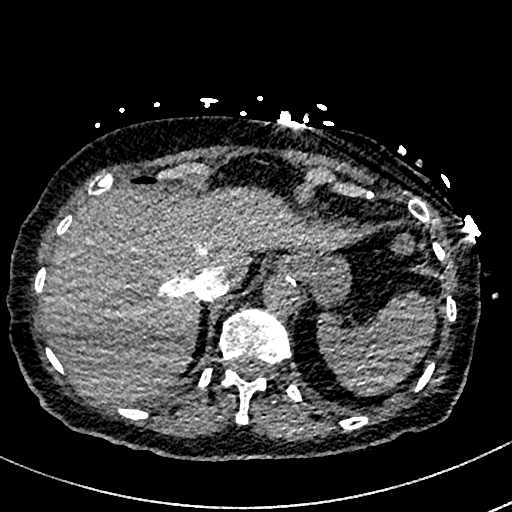
[im 172/601  lung]
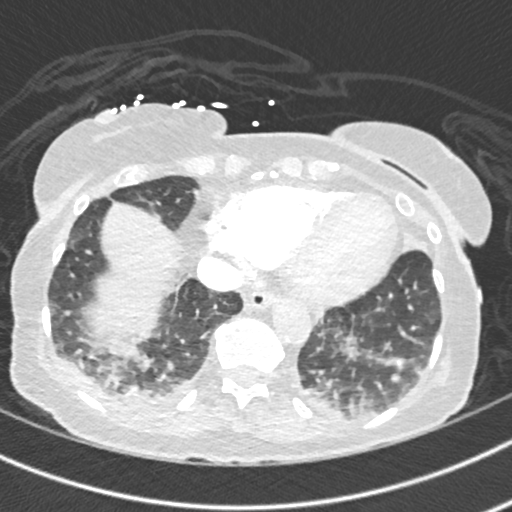
[im 201/601  mediastinal]
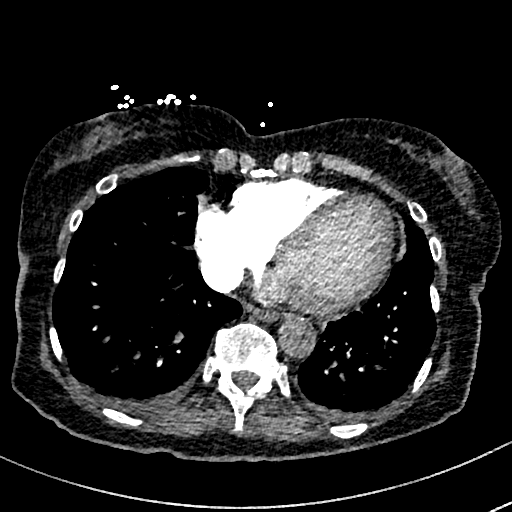
[im 229/601  lung]
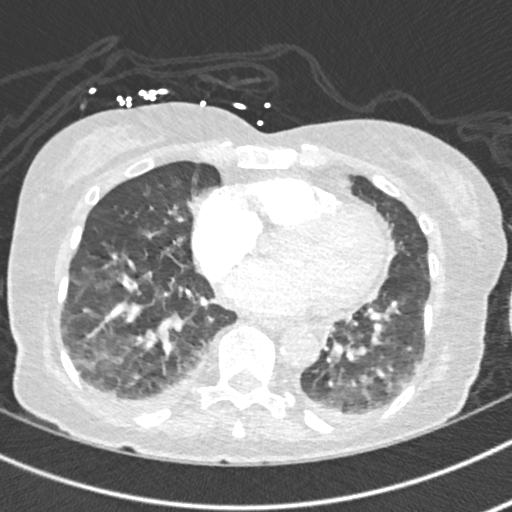
[im 258/601  mediastinal]
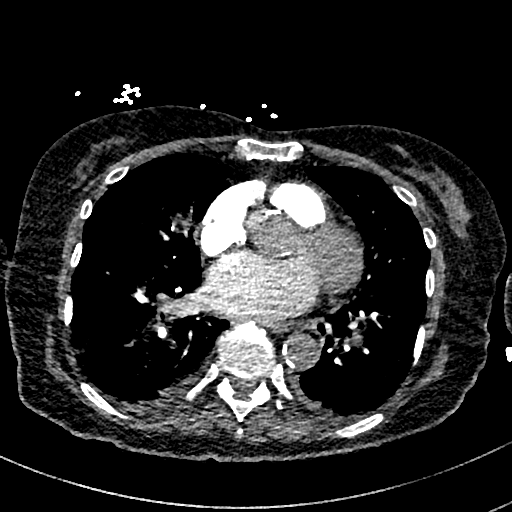
[im 286/601  lung]
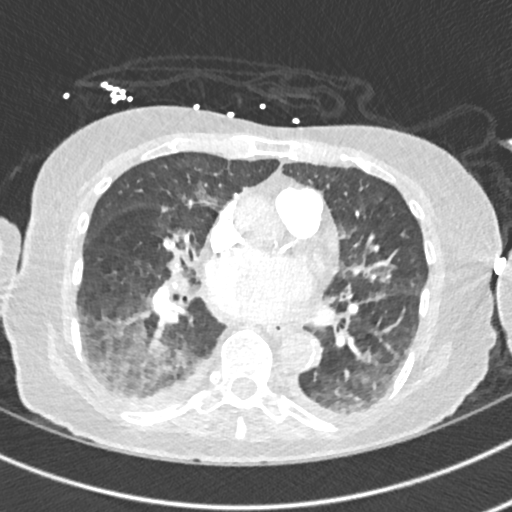
[im 315/601  mediastinal]
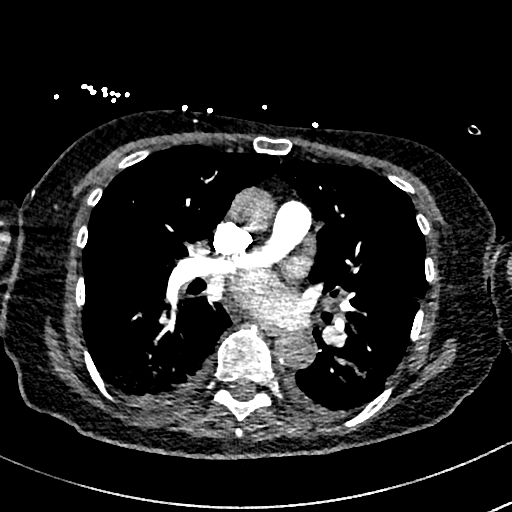
[im 343/601  lung]
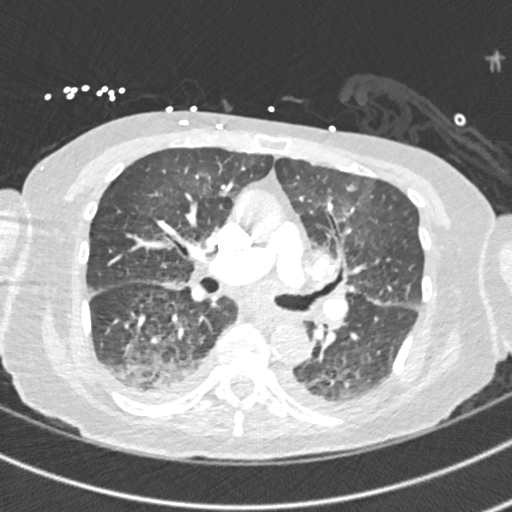
[im 372/601  mediastinal]
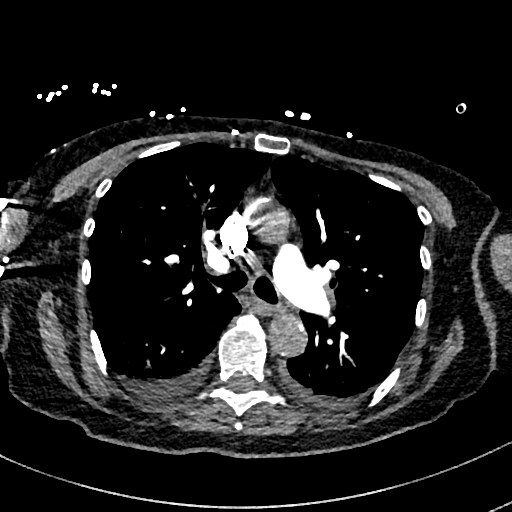
[im 401/601  lung]
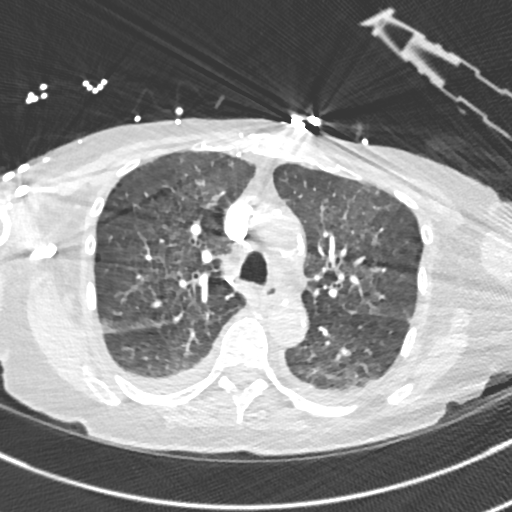
[im 458/601  mediastinal]
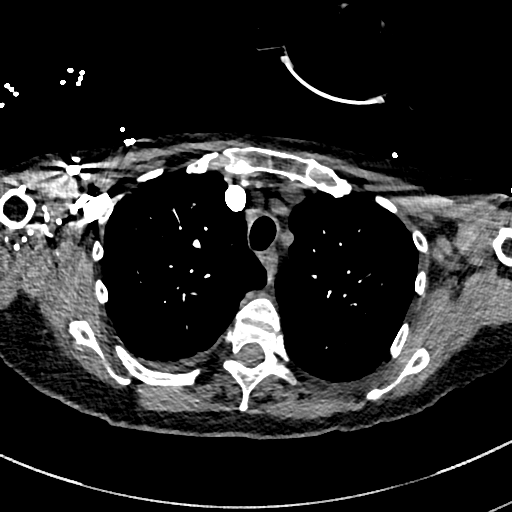
[im 486/601  lung]
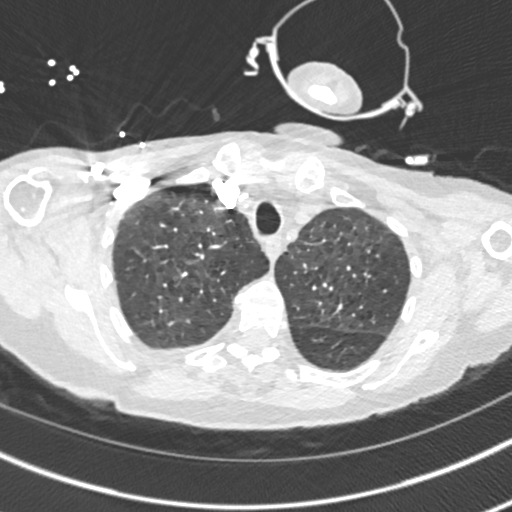
[im 515/601  mediastinal]
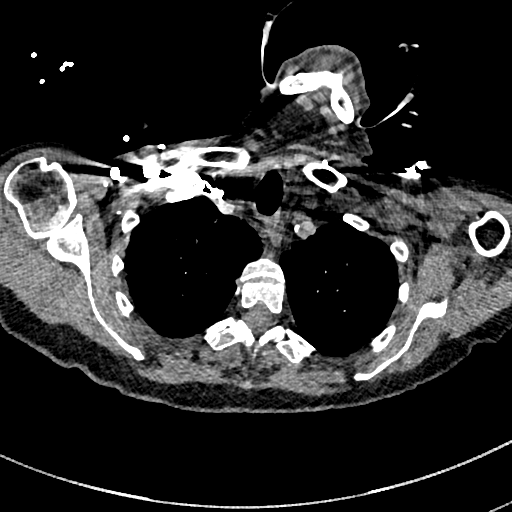
[im 543/601  lung]
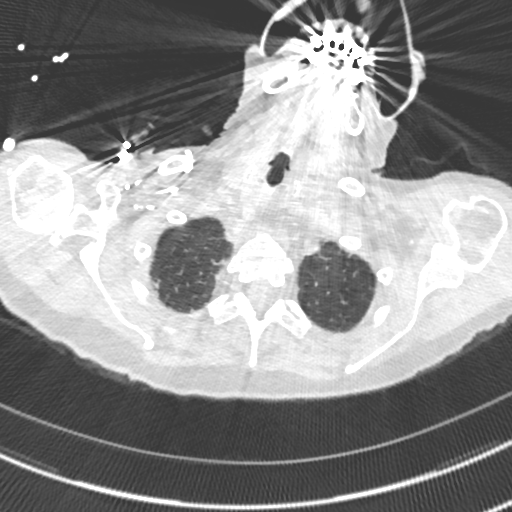
[im 572/601  mediastinal]
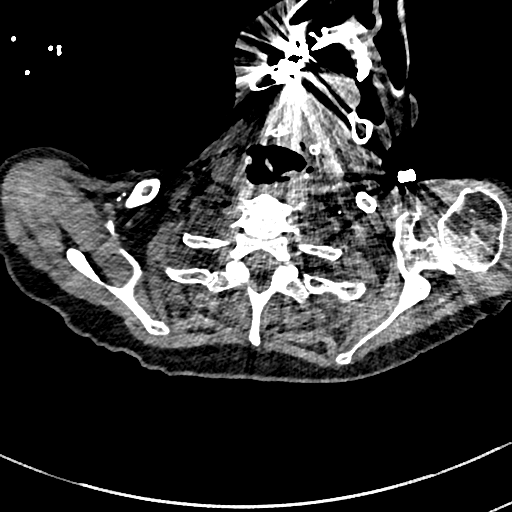

[Series 8: pe 2mm cor · coronal · 0.59mm/px · 1 of 111 slices shown]
[im 56/111  mediastinal]
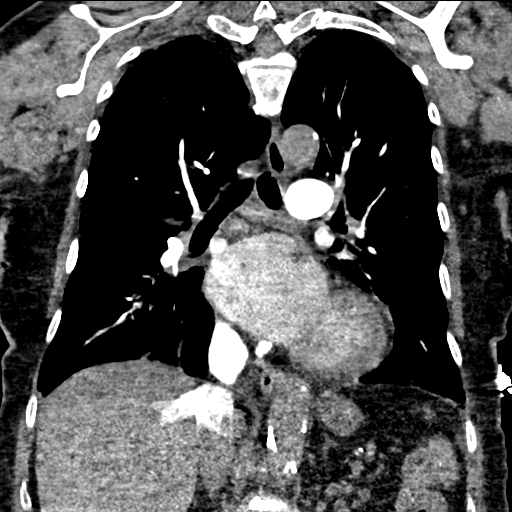

[19 of 36 positions shown; findings below may reference images not displayed]

FINDINGS: There is no demonstrable pulmonary embolus. There is atherosclerotic
change in the thoracic aorta without aneurysm. There is
atherosclerotic change in the visualized great vessels without
high-grade obstruction appreciable.

There are small pleural effusions bilaterally. There is underlying
centrilobular emphysematous change. There is interstitial and patchy
alveolar edema bilaterally.

There is reflux of contrast into the inferior vena cava and hepatic
veins. Heart is slightly enlarged. There is no appreciable
pericardial effusion. There are scattered foci of coronary artery
calcification.

The thyroid is diminutive. There is no appreciable thoracic
adenopathy. Small mediastinal lymph nodes are present which do not
meet size criteria for pathologic significance.

In the visualized upper abdomen, gallbladder is absent. There is
atherosclerotic change in the visualized upper abdominal aorta.

There are no blastic or lytic bone lesions. There is degenerative
change in the thoracic spine.

Review of the MIP images confirms the above findings.
IMPRESSION: No demonstrable pulmonary embolus. Evidence of congestive heart
failure superimposed on emphysematous change.

No demonstrable adenopathy. Scattered foci of coronary artery
calcification noted.

Reflux of contrast into the inferior vena cava and hepatic veins is
indicative of increased right heart pressure.

## 2017-01-08 DIAGNOSIS — H52223 Regular astigmatism, bilateral: Secondary | ICD-10-CM | POA: Diagnosis not present

## 2017-01-08 DIAGNOSIS — H5021 Vertical strabismus, right eye: Secondary | ICD-10-CM | POA: Diagnosis not present

## 2017-01-09 ENCOUNTER — Telehealth: Payer: Self-pay | Admitting: Internal Medicine

## 2017-01-09 ENCOUNTER — Other Ambulatory Visit: Payer: Self-pay

## 2017-01-09 NOTE — Telephone Encounter (Signed)
Amy Fischer this patient is sch for a prolia injection on monday

## 2017-01-09 NOTE — Telephone Encounter (Signed)
Prolia is in the refrigerator in IM pod

## 2017-01-09 NOTE — Patient Outreach (Signed)
[facility] [facility]) Care Management  [place]  01/09/2017   Amy Fischer 1929-01-27 [id number]  Subjective: Telephone call to patient for monthly call.  Patient reports that she is coming down with questionable cold.  Patient reports she is taking mucinex and taking it easy.  Discussed with patient cold symptoms and worsening and when to notify physician. Patient reports she has not had any falls and she still takes water exercise to assist with balance. Patient denies swelling or shortness of breath.  Discussed with patient signs and symptoms of heart failure and when to notify physician. She verbalized understanding.    Objective:   Encounter Medications:  Outpatient Encounter Prescriptions as of 01/09/2017  Medication Sig Note  . acetaminophen (TYLENOL) 325 MG tablet Take 2 tablets (650 mg total) by mouth every 6 (six) hours as needed for mild pain.   [name] aspirin EC 81 MG EC tablet Take 1 tablet (81 mg total) by mouth daily. (Patient taking differently: Take 81 mg by mouth at bedtime. )   . budesonide-formoterol (SYMBICORT) 160-4.5 MCG/ACT inhaler Inhale 2 puffs into the lungs 2 (two) times daily. Or as directed (Patient taking differently: Inhale 2 puffs into the lungs 2 (two) times daily as needed. Or as directed)   . cholecalciferol (VITAMIN D) 1000 units tablet Take 1,000 Units by mouth daily. 08/08/2016: Will bring bottle in for dosing  . CRESTOR 5 MG tablet TAKE ONE-HALF (1/2) TABLET DAILY   . DiphenhydrAMINE HCl (BENADRYL PO) Take by mouth.   . docusate sodium (COLACE) 100 MG capsule Take 100 mg by mouth daily as needed for mild constipation.   . fish oil-omega-3 fatty acids 1000 MG capsule Take 1 g by mouth daily.    . folic acid (FOLVITE) 1 MG tablet TAKE 1 TABLET DAILY   . glucosamine-chondroitin 500-400 MG tablet Take 1 tablet by mouth daily.    [name] ipratropium (ATROVENT) 0.03 % nasal spray Place 2 sprays into both nostrils 3 (three) times daily as needed for  rhinitis.   [name] levothyroxine (SYNTHROID, LEVOTHROID) 100 MCG tablet TAKE 1 TABLET EVERY MORNING ON AN EMPTY STOMACH   . Melatonin 3 MG TABS Take 3 mg by mouth at bedtime.    . MULTIPLE VITAMIN PO Take 1 tablet by mouth daily. 50+ Senior Vitamin Daily   . omeprazole (PRILOSEC) 40 MG capsule TAKE 1 CAPSULE DAILY (Patient taking differently: TAKE 1 CAPSULE DAILY before dinner)   . Polyethyl Glycol-Propyl Glycol (SYSTANE OP) Place 1 drop into both eyes 2 (two) times daily. Use 1-2 Drops in both eyes   . PROAIR HFA 108 (90 Base) MCG/ACT inhaler Inhale 2 puffs into the lungs every 6 (six) hours as needed for wheezing or shortness of breath   . SALINE NASAL SPRAY NA Place 1 spray into both nostrils 2 (two) times daily as needed (congestion).    . vitamin E 400 UNIT capsule Take 400 Units by mouth daily.   [name] ZETIA 10 MG tablet TAKE 1 TABLET DAILY   . traMADol (ULTRAM) 50 MG tablet Take 1 tablet (50 mg total) by mouth every 6 (six) hours as needed. (Patient not taking: Reported on 01/09/2017)    No facility-administered encounter medications on file as of 01/09/2017.     Functional Status:  In your present state of health, do you have any difficulty performing the following activities: 08/08/2016  Hearing? N  Vision? N  Difficulty concentrating or making decisions? N  Walking or climbing stairs? N  Dressing  or bathing? N  Doing errands, shopping? N  Preparing Food and eating ? N  Using the Toilet? N  In the past six months, have you accidently leaked urine? Y  Do you have problems with loss of bowel control? N  Managing your Medications? N  Managing your Finances? N  Housekeeping or managing your Housekeeping? N  Some recent data might be hidden    Fall/Depression Screening: PHQ 2/9 Scores 01/09/2017 12/11/2016 11/14/2016 10/23/2016 09/05/2016 08/08/2016 08/01/2016  PHQ - 2 Score 0 0 0 0 0 0 0  PHQ- 9 Score - - - - - - -    Assessment: Patient continues to benefit from health coach outreach for  disease management and support.    Plan:  [facility] CM Care Plan Problem One   Flowsheet Row Most Recent Value  Care Plan Problem One  Heart Failure knowledge deficit  Role Documenting the Problem One  Health Coach  Care Plan for Problem One  Active  THN Long Term Goal (31-90 days)  Patient will verbalize signs and symptoms of heart failure within 90 days.  THN Long Term Goal Start Date  01/09/17 [goal continued]  Interventions for Problem One Long Term Goal  RN Health Coach reeducated signs and symptoms of heart failure with patient.  THN CM Short Term Goal #2 (0-30 days)  Patient will verbalize signs of heart failure red zone within the next 30 days.   THN CM Short Term Goal #2 Start Date  12/11/16 [goal continued]  [facility] CM Short Term Goal #2 Met Date  01/09/17  Interventions for Short Term Goal #2  Patient able to discuss heart failure zones.       RN Health Coach will contact patient in the month of February and patient agrees to next outreach.  Jone Baseman, RN, MSN [place] [phone]

## 2017-01-12 ENCOUNTER — Ambulatory Visit (INDEPENDENT_AMBULATORY_CARE_PROVIDER_SITE_OTHER): Payer: Medicare Other | Admitting: Family Medicine

## 2017-01-12 DIAGNOSIS — M810 Age-related osteoporosis without current pathological fracture: Secondary | ICD-10-CM | POA: Diagnosis not present

## 2017-01-12 MED ORDER — DENOSUMAB 60 MG/ML ~~LOC~~ SOLN
60.0000 mg | Freq: Once | SUBCUTANEOUS | Status: AC
  Administered 2017-01-12: 60 mg via SUBCUTANEOUS

## 2017-02-01 ENCOUNTER — Other Ambulatory Visit: Payer: Self-pay | Admitting: Internal Medicine

## 2017-02-04 NOTE — Telephone Encounter (Signed)
No lipid panel since 2016.  Ok to send in?  Pt has follow up on 02/17/17.

## 2017-02-05 ENCOUNTER — Other Ambulatory Visit: Payer: Self-pay

## 2017-02-05 NOTE — Patient Outreach (Signed)
[facility]) Care Management  Kennan  02/05/2017   Amy Fischer 02-03-1929 [id number]  Subjective: Telephone call to patient for monthly call. Patient reports she is doing ok. She does reports that her blood pressure is up some but she is fairing well with that.  Patient denies any problems with increased swelling or shortness of breath.  Discussed with patient signs of heart failure and when to notify physician. She verbalized understanding.    Objective:   Encounter Medications:  Outpatient Encounter Prescriptions as of 02/05/2017  Medication Sig Note  . acetaminophen (TYLENOL) 325 MG tablet Take 2 tablets (650 mg total) by mouth every 6 (six) hours as needed for mild pain.   [name] aspirin EC 81 MG EC tablet Take 1 tablet (81 mg total) by mouth daily. (Patient taking differently: Take 81 mg by mouth at bedtime. )   . budesonide-formoterol (SYMBICORT) 160-4.5 MCG/ACT inhaler Inhale 2 puffs into the lungs 2 (two) times daily. Or as directed (Patient taking differently: Inhale 2 puffs into the lungs 2 (two) times daily as needed. Or as directed)   . cholecalciferol (VITAMIN D) 1000 units tablet Take 1,000 Units by mouth daily. 08/08/2016: Will bring bottle in for dosing  . DiphenhydrAMINE HCl (BENADRYL PO) Take by mouth.   . docusate sodium (COLACE) 100 MG capsule Take 100 mg by mouth daily as needed for mild constipation.   . fish oil-omega-3 fatty acids 1000 MG capsule Take 1 g by mouth daily.    . folic acid (FOLVITE) 1 MG tablet TAKE 1 TABLET DAILY   . glucosamine-chondroitin 500-400 MG tablet Take 1 tablet by mouth daily.    [name] ipratropium (ATROVENT) 0.03 % nasal spray Place 2 sprays into both nostrils 3 (three) times daily as needed for rhinitis.   [name] levothyroxine (SYNTHROID, LEVOTHROID) 100 MCG tablet TAKE 1 TABLET EVERY MORNING ON AN EMPTY STOMACH   . Melatonin 3 MG TABS Take 3 mg by mouth at bedtime.    . MULTIPLE VITAMIN PO Take 1 tablet by mouth daily. 50+  Senior Vitamin Daily   . omeprazole (PRILOSEC) 40 MG capsule TAKE 1 CAPSULE DAILY (Patient taking differently: TAKE 1 CAPSULE DAILY before dinner)   . Polyethyl Glycol-Propyl Glycol (SYSTANE OP) Place 1 drop into both eyes 2 (two) times daily. Use 1-2 Drops in both eyes   . PROAIR HFA 108 (90 Base) MCG/ACT inhaler Inhale 2 puffs into the lungs every 6 (six) hours as needed for wheezing or shortness of breath   . rosuvastatin (CRESTOR) 5 MG tablet TAKE ONE-HALF (1/2) TABLET DAILY   . SALINE NASAL SPRAY NA Place 1 spray into both nostrils 2 (two) times daily as needed (congestion).    . vitamin E 400 UNIT capsule Take 400 Units by mouth daily.   [name] ZETIA 10 MG tablet TAKE 1 TABLET DAILY   . traMADol (ULTRAM) 50 MG tablet Take 1 tablet (50 mg total) by mouth every 6 (six) hours as needed. (Patient not taking: Reported on 01/09/2017)    No facility-administered encounter medications on file as of 02/05/2017.     Functional Status:  In your present state of health, do you have any difficulty performing the following activities: 08/08/2016  Hearing? N  Vision? N  Difficulty concentrating or making decisions? N  Walking or climbing stairs? N  Dressing or bathing? N  Doing errands, shopping? N  Preparing Food and eating ? N  Using the Toilet? N  In the past six months,  have you accidently leaked urine? Y  Do you have problems with loss of bowel control? N  Managing your Medications? N  Managing your Finances? N  Housekeeping or managing your Housekeeping? N  Some recent data might be hidden    Fall/Depression Screening: PHQ 2/9 Scores 02/05/2017 01/09/2017 12/11/2016 11/14/2016 10/23/2016 09/05/2016 08/08/2016  PHQ - 2 Score 0 0 0 0 0 0 0  PHQ- 9 Score - - - - - - -    Assessment: Patient continues to benefit from health coach outreach for disease management and support.    Plan:  [facility] CM Care Plan Problem One   Flowsheet Row Most Recent Value  Care Plan Problem One  Heart Failure knowledge  deficit  Role Documenting the Problem One  Health Coach  Care Plan for Problem One  Active  THN Long Term Goal (31-90 days)  Patient will verbalize signs and symptoms of heart failure within 90 days.  THN Long Term Goal Start Date  02/05/17 [goal continued]  Interventions for Problem One Long Term Goal  RN Health Coach reiterated signs and symptoms of heart failure with patient.  THN CM Short Term Goal #2 Start Date  -- [goal continued]     RN Health Coach will contact patient in the month of March and patient agrees to next outreach.  Jone Baseman, RN, MSN [place] [phone]

## 2017-02-15 NOTE — Progress Notes (Signed)
Chief Complaint  Patient presents with  . Elevated BP Readings    HPI: Amy Fischer 83 y.o. come in for Chronic disease management    Monthly   chf clinic contact    Had been noting her blood pressure was a bit high when she had a respiratory infection. Now over  Cold and still high  comes in today with some readings. In December [birth year] range January 1 52 05/29/1954 range most recently 150 04/28/1930 and 170 range. She hasn't been as active. However she is over her respiratory infection.  Mostly battling the tearing eye problem.  Had   Fall in past .   .doing walking   Water balance exercise .  No fracture since she has been on prolia  No current chest pain shortness of breath or swelling in her ankles. She thinks her blood pressure was better when she was physically active.  Right index finger with cyst on and is more problematic and nontender at times saw a hand surgeon years ago Dr. Daylene Katayama and conservative observation with the plan. ROS: See pertinent positives and negatives per HPI.  Past Medical History:  Diagnosis Date  . Allergy   . Anemia   . Asymptomatic carotid artery stenosis    R ICA 40% stenosis on CTA 12/2011.  . Bladder polyps   . Cataract    BILATERAL-REMOVED  . CHF (congestive heart failure) ([place])   . Diverticulosis   . Elevated blood pressure 03/25/2011   Bp readings borderline today has hx of elvation  In office and ok at home   Not checke recently   She gfeels was elevated from anxiety    . Episodic recurrent vertigo    MRI Head 2003  . Esophageal spasm   . GERD (gastroesophageal reflux disease)   . Hepatic hemangioma   . History of hepatitis    unknown type  . Hyperlipidemia   . Hyperplastic colon polyp   . Hypothyroidism   . Intestinal metaplasia of gastric mucosa   . Osteoarthritis   . Osteoporosis   . Patellar fracture 07/13/2012  . Scapular fracture 03/31/2012   small from direct blow with trip    . Subclavian steal  syndrome    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; CTA 2013 revealed open graft    Family History  Problem Relation Age of Onset  . Hypertension Mother   . Stroke Mother   . Heart disease Father   . Colon cancer Neg Hx     Social History   Social History  . Marital status: Widowed    Spouse name: N/A  . Number of children: 4  . Years of education: N/A   Occupational History  . retired    Social History Main Topics  . Smoking status: Former Smoker    Packs/day: 0.75    Years: 20.00    Quit date: 01/05/1991  . Smokeless tobacco: Never Used  . Alcohol use 2.4 oz/week    4 Glasses of wine per week     Comment: socially  . Drug use: No  . Sexual activity: Not Asked   Other Topics Concern  . None   Social History Narrative   Widowed   HH of 1    No pets   Former smoker   Exercises regularly    Outpatient Medications Prior to Visit  Medication Sig Dispense Refill  . acetaminophen (TYLENOL) 325 MG tablet Take 2 tablets (650 mg total) by mouth  every 6 (six) hours as needed for mild pain.    [name] aspirin EC 81 MG EC tablet Take 1 tablet (81 mg total) by mouth daily. (Patient taking differently: Take 81 mg by mouth at bedtime. )    . budesonide-formoterol (SYMBICORT) 160-4.5 MCG/ACT inhaler Inhale 2 puffs into the lungs 2 (two) times daily. Or as directed (Patient taking differently: Inhale 2 puffs into the lungs 2 (two) times daily as needed. Or as directed) 1 Inhaler 2  . cholecalciferol (VITAMIN D) 1000 units tablet Take 1,000 Units by mouth daily.    . DiphenhydrAMINE HCl (BENADRYL PO) Take by mouth.    . docusate sodium (COLACE) 100 MG capsule Take 100 mg by mouth daily as needed for mild constipation.    . fish oil-omega-3 fatty acids 1000 MG capsule Take 1 g by mouth daily.     . folic acid (FOLVITE) 1 MG tablet TAKE 1 TABLET DAILY 90 tablet 1  . glucosamine-chondroitin 500-400 MG tablet Take 1 tablet by mouth daily.     [name] ipratropium (ATROVENT) 0.03 % nasal  spray Place 2 sprays into both nostrils 3 (three) times daily as needed for rhinitis. 90 mL 1  . levothyroxine (SYNTHROID, LEVOTHROID) 100 MCG tablet TAKE 1 TABLET EVERY MORNING ON AN EMPTY STOMACH 90 tablet 1  . Melatonin 3 MG TABS Take 3 mg by mouth at bedtime.     . MULTIPLE VITAMIN PO Take 1 tablet by mouth daily. 50+ Senior Vitamin Daily    . omeprazole (PRILOSEC) 40 MG capsule TAKE 1 CAPSULE DAILY (Patient taking differently: TAKE 1 CAPSULE DAILY before dinner) 90 capsule 1  . Polyethyl Glycol-Propyl Glycol (SYSTANE OP) Place 1 drop into both eyes 2 (two) times daily. Use 1-2 Drops in both eyes    . PROAIR HFA 108 (90 Base) MCG/ACT inhaler Inhale 2 puffs into the lungs every 6 (six) hours as needed for wheezing or shortness of breath 1 Inhaler 2  . rosuvastatin (CRESTOR) 5 MG tablet TAKE ONE-HALF (1/2) TABLET DAILY 90 tablet 1  . SALINE NASAL SPRAY NA Place 1 spray into both nostrils 2 (two) times daily as needed (congestion).     . traMADol (ULTRAM) 50 MG tablet Take 1 tablet (50 mg total) by mouth every 6 (six) hours as needed. 15 tablet 0  . vitamin E 400 UNIT capsule Take 400 Units by mouth daily.    [name] ZETIA 10 MG tablet TAKE 1 TABLET DAILY 90 tablet 1   No facility-administered medications prior to visit.      EXAM:  BP (!) 160/74 (BP Location: Right Arm, Patient Position: Sitting, Cuff Size: Normal)   Temp 97.9 F (36.6 C) (Oral)   Ht 5\' 2"  (1.575 m)   Wt 123 lb 14.4 oz (56.2 kg)   BMI 22.66 kg/m   Body mass index is 22.66 kg/m. Blood pressure readings left 160/78 right 164/78 sitting. Pulse around 72 regular rhythm. GENERAL: vitals reviewed and listed above, alert, oriented, appears well hydrated and in no acute distress HEENT: atraumatic, conjunctiva  clear, no obvious abnormalities on inspection of external nose and earsNECK: no obvious masses on inspection palpation  LUNGS: clear to auscultation bilaterally, no wheezes, rales or rhonchi,  CV: HRRR, no clubbing  cyanosis or  peripheral edema nl cap refill  MS: moves all extremities without noticeable focal  Abnormality right tindex finger with a distal cystic node on the DIP. States it's been bothersome. PSYCH: pleasant and cooperative, no obvious depression or anxiety Lab Results  Component Value Date   WBC 6.8 02/16/2016   HGB 13.4 02/16/2016   HCT 41.9 02/16/2016   PLT 197 02/16/2016   GLUCOSE 101 (H) 04/29/2016   CHOL 149 12/04/2015   TRIG 110.0 12/04/2015   HDL 54.40 12/04/2015   LDLCALC 72 12/04/2015   ALT 11 (L) 10/15/2015   AST 15 10/15/2015   NA 140 04/29/2016   K 4.3 04/29/2016   CL 105 04/29/2016   CREATININE 0.74 04/29/2016   BUN 16 04/29/2016   CO2 25 04/29/2016   TSH 0.94 02/15/2016   INR 1.02 01/12/2016   HGBA1C 5.1 10/14/2016   BP Readings from Last 3 Encounters:  02/17/17 (!) 160/74  10/14/16 138/70  08/08/16 132/78   Wt Readings from Last 3 Encounters:  02/17/17 123 lb 14.4 oz (56.2 kg)  10/14/16 118 lb 12.8 oz (53.9 kg)  08/08/16 118 lb 9.6 oz (53.8 kg)    ASSESSMENT AND PLAN:  Discussed the following assessment and plan:  Hypertension, isolated systolic  Chronic diastolic CHF (congestive heart failure), NYHA class 2 (HCC)  Nodule of finger, right - related to arthritis  6 mos  OV  Blood pressure had been controlled and want to avoid orthostatic falling. Although she has been on antihypertensive medicines in the past. Elevation is been most recent and blood pressure seems equal in both extremities. She will send in readings twice a day from the week and then make decision about intervention. In consult with her cardiologist Dr. Stanford Breed and the CHF clinic   Discussed her eye predicament consider second consult second opinion. We'll try to protect the right index finger if still problematic we can have her see the hand specialist but at this time would do conservative intervention.  Labs in 6 months visit or as needed  Depending on bp  Management    -Patient advised to return or notify health care team  if  new concerns arise. Total visit 50mins > 50% spent counseling and coordinating care as indicated in above note and in instructions to patient .   Patient Instructions  Take blood pressure readings twice a day for 7-  days and then periodically    .Send in readings     will consult with your cardiologist about  Plan . Adding meds .   Than plan follow up.  Or 6 months           [name]. Panosh M.D.

## 2017-02-17 ENCOUNTER — Encounter: Payer: Self-pay | Admitting: Internal Medicine

## 2017-02-17 ENCOUNTER — Ambulatory Visit (INDEPENDENT_AMBULATORY_CARE_PROVIDER_SITE_OTHER): Payer: Medicare Other | Admitting: Internal Medicine

## 2017-02-17 VITALS — BP 160/74 | Temp 97.9°F | Ht 62.0 in | Wt 123.9 lb

## 2017-02-17 DIAGNOSIS — R2231 Localized swelling, mass and lump, right upper limb: Secondary | ICD-10-CM

## 2017-02-17 DIAGNOSIS — I5032 Chronic diastolic (congestive) heart failure: Secondary | ICD-10-CM | POA: Diagnosis not present

## 2017-02-17 DIAGNOSIS — I10 Essential (primary) hypertension: Secondary | ICD-10-CM

## 2017-02-17 NOTE — Patient Instructions (Signed)
Take blood pressure readings twice a day for 7-  days and then periodically    .Send in readings     will consult with your cardiologist about  Plan . Adding meds .   Than plan follow up.  Or 6 months

## 2017-03-05 ENCOUNTER — Other Ambulatory Visit: Payer: Self-pay

## 2017-03-05 NOTE — Patient Outreach (Signed)
[place] [facility]) Care Management  03/05/2017  Amy Fischer 06/16/1929 [id number]   Telephone call to patient for monthly call.  No answer.  HIPAA compliant voice message left.    Plan: RN Health Coach will attempt patient again in the month of March.    Jone Baseman, RN, MSN [place] [phone]

## 2017-03-18 ENCOUNTER — Other Ambulatory Visit: Payer: Self-pay

## 2017-03-18 NOTE — Patient Outreach (Signed)
[place] [facility]) Care Management  [place]  03/18/2017   SYLINA HENION 1929-02-28 [id number]  Subjective: Telephone call to patient for monthly call.  She reports that she is doing ok.  She reports she has some stress some company visiting.  She reports that she can rest now that they are gone.  Patient reports no problems with swelling, shortness of breath, or tiredness.  Discussed with patient signs of heart failure and reaching out for help.  She verbalized understanding.    Objective:   Encounter Medications:  Outpatient Encounter Prescriptions as of 03/18/2017  Medication Sig Note  . acetaminophen (TYLENOL) 325 MG tablet Take 2 tablets (650 mg total) by mouth every 6 (six) hours as needed for mild pain.   [name] aspirin EC 81 MG EC tablet Take 1 tablet (81 mg total) by mouth daily. (Patient taking differently: Take 81 mg by mouth at bedtime. )   . budesonide-formoterol (SYMBICORT) 160-4.5 MCG/ACT inhaler Inhale 2 puffs into the lungs 2 (two) times daily. Or as directed (Patient taking differently: Inhale 2 puffs into the lungs 2 (two) times daily as needed. Or as directed)   . cholecalciferol (VITAMIN D) 1000 units tablet Take 1,000 Units by mouth daily. 08/08/2016: Will bring bottle in for dosing  . DiphenhydrAMINE HCl (BENADRYL PO) Take by mouth.   . docusate sodium (COLACE) 100 MG capsule Take 100 mg by mouth daily as needed for mild constipation.   . fish oil-omega-3 fatty acids 1000 MG capsule Take 1 g by mouth daily.    . folic acid (FOLVITE) 1 MG tablet TAKE 1 TABLET DAILY   . glucosamine-chondroitin 500-400 MG tablet Take 1 tablet by mouth daily.    [name] ipratropium (ATROVENT) 0.03 % nasal spray Place 2 sprays into both nostrils 3 (three) times daily as needed for rhinitis.   [name] levothyroxine (SYNTHROID, LEVOTHROID) 100 MCG tablet TAKE 1 TABLET EVERY MORNING ON AN EMPTY STOMACH   . Melatonin 3 MG TABS Take 3 mg by mouth at bedtime.    . MULTIPLE VITAMIN PO Take 1  tablet by mouth daily. 50+ Senior Vitamin Daily   . omeprazole (PRILOSEC) 40 MG capsule TAKE 1 CAPSULE DAILY (Patient taking differently: TAKE 1 CAPSULE DAILY before dinner) 03/18/2017: Taking on occasion when acid reflux flares.   Vladimir Faster Glycol-Propyl Glycol (SYSTANE OP) Place 1 drop into both eyes 2 (two) times daily. Use 1-2 Drops in both eyes   . PROAIR HFA 108 (90 Base) MCG/ACT inhaler Inhale 2 puffs into the lungs every 6 (six) hours as needed for wheezing or shortness of breath   . rosuvastatin (CRESTOR) 5 MG tablet TAKE ONE-HALF (1/2) TABLET DAILY   . SALINE NASAL SPRAY NA Place 1 spray into both nostrils 2 (two) times daily as needed (congestion).    . vitamin E 400 UNIT capsule Take 400 Units by mouth daily.   [name] ZETIA 10 MG tablet TAKE 1 TABLET DAILY   . traMADol (ULTRAM) 50 MG tablet Take 1 tablet (50 mg total) by mouth every 6 (six) hours as needed. (Patient not taking: Reported on 03/18/2017)    No facility-administered encounter medications on file as of 03/18/2017.     Functional Status:  In your present state of health, do you have any difficulty performing the following activities: 08/08/2016  Hearing? N  Vision? N  Difficulty concentrating or making decisions? N  Walking or climbing stairs? N  Dressing or bathing? N  Doing errands, shopping? N  Preparing  Food and eating ? N  Using the Toilet? N  In the past six months, have you accidently leaked urine? Y  Do you have problems with loss of bowel control? N  Managing your Medications? N  Managing your Finances? N  Housekeeping or managing your Housekeeping? N  Some recent data might be hidden    Fall/Depression Screening: PHQ 2/9 Scores 03/18/2017 02/05/2017 01/09/2017 12/11/2016 11/14/2016 10/23/2016 09/05/2016  PHQ - 2 Score 0 0 0 0 0 0 0  PHQ- 9 Score - - - - - - -    Assessment: Patient continues to benefit from health coach outreach for disease management and support.    Plan:  [facility] CM Care Plan Problem One      Most Recent Value  Care Plan Problem One  Heart Failure knowledge deficit  Role Documenting the Problem One  Health Coach  Care Plan for Problem One  Active  THN Long Term Goal (31-90 days)  Patient will verbalize signs and symptoms of heart failure within 90 days.  THN Long Term Goal Start Date  03/18/17 [goal continued]  Interventions for Problem One Long Term Goal  RN Health Coach reviewed signs and symptoms of heart failure with patient.      RN Health Coach will contact patient in the month of April and patient agrees to next outreach.  Jone Baseman, RN, MSN [place] [phone]

## 2017-03-23 ENCOUNTER — Other Ambulatory Visit: Payer: Self-pay | Admitting: Internal Medicine

## 2017-03-28 ENCOUNTER — Other Ambulatory Visit: Payer: Self-pay | Admitting: Internal Medicine

## 2017-04-15 ENCOUNTER — Other Ambulatory Visit: Payer: Self-pay

## 2017-04-15 NOTE — Patient Outreach (Signed)
[place] [facility]) Care Management  04/15/2017  ARBOR LEER 1929/01/28 [id number]   Telephone call to patient for monthly call.  No answer.  HIPAA compliant voice message left.  Plan: RN Health Coach will attempt patient again in the month of April.   Jone Baseman, RN, MSN [place] [phone]

## 2017-04-27 ENCOUNTER — Other Ambulatory Visit: Payer: Self-pay

## 2017-04-27 NOTE — Patient Outreach (Signed)
Amy Fischer) Care Management  [name]  04/27/2017   Amy Fischer 08-21-1929 [id number]  Subjective: Telephone call to patient for monthly call.  Patient reports she is doing ok.  She reports some disappointment by not being able to do water walking as the pool does not have a Automotive engineer at [facility].  Discussed with patient trying the [facility] and aquatics center in town.  She verbalized understanding.  Patient denies any falls presently.  She also denies swelling or shortness of breath.  Reiterated with patient heart failure and when to notify physician.  Discussed with patient possible case closure next month as she has done well.  She verbalized understanding.      Objective:   Encounter Medications:  Outpatient Encounter Prescriptions as of 04/27/2017  Medication Sig Note  . acetaminophen (TYLENOL) 325 MG tablet Take 2 tablets (650 mg total) by mouth every 6 (six) hours as needed for mild pain.   [name] aspirin EC 81 MG EC tablet Take 1 tablet (81 mg total) by mouth daily. (Patient taking differently: Take 81 mg by mouth at bedtime. )   . budesonide-formoterol (SYMBICORT) 160-4.5 MCG/ACT inhaler Inhale 2 puffs into the lungs 2 (two) times daily. Or as directed (Patient taking differently: Inhale 2 puffs into the lungs 2 (two) times daily as needed. Or as directed)   . cholecalciferol (VITAMIN D) 1000 units tablet Take 1,000 Units by mouth daily. 08/08/2016: Will bring bottle in for dosing  . DiphenhydrAMINE HCl (BENADRYL PO) Take by mouth.   . docusate sodium (COLACE) 100 MG capsule Take 100 mg by mouth daily as needed for mild constipation.   [name] ezetimibe (ZETIA) 10 MG tablet TAKE 1 TABLET DAILY   . fish oil-omega-3 fatty acids 1000 MG capsule Take 1 g by mouth daily.    . folic acid (FOLVITE) 1 MG tablet TAKE 1 TABLET DAILY   . glucosamine-chondroitin 500-400 MG tablet Take 1 tablet by mouth daily.    [name] ipratropium (ATROVENT) 0.03 % nasal spray Place 2 sprays  into both nostrils 3 (three) times daily as needed for rhinitis.   [name] levothyroxine (SYNTHROID, LEVOTHROID) 100 MCG tablet TAKE 1 TABLET EVERY MORNING ON AN EMPTY STOMACH   . Melatonin 3 MG TABS Take 3 mg by mouth at bedtime.    . MULTIPLE VITAMIN PO Take 1 tablet by mouth daily. 50+ Senior Vitamin Daily   . omeprazole (PRILOSEC) 40 MG capsule TAKE 1 CAPSULE DAILY (Patient taking differently: TAKE 1 CAPSULE DAILY before dinner) 03/18/2017: Taking on occasion when acid reflux flares.   Vladimir Faster Glycol-Propyl Glycol (SYSTANE OP) Place 1 drop into both eyes 2 (two) times daily. Use 1-2 Drops in both eyes   . PROAIR HFA 108 (90 Base) MCG/ACT inhaler Inhale 2 puffs into the lungs every 6 (six) hours as needed for wheezing or shortness of breath   . rosuvastatin (CRESTOR) 5 MG tablet TAKE ONE-HALF (1/2) TABLET DAILY   . SALINE NASAL SPRAY NA Place 1 spray into both nostrils 2 (two) times daily as needed (congestion).    . vitamin E 400 UNIT capsule Take 400 Units by mouth daily.   . traMADol (ULTRAM) 50 MG tablet Take 1 tablet (50 mg total) by mouth every 6 (six) hours as needed. (Patient not taking: Reported on 03/18/2017)    No facility-administered encounter medications on file as of 04/27/2017.     Functional Status:  In your present state of health, do you have any difficulty  performing the following activities: 08/08/2016  Hearing? N  Vision? N  Difficulty concentrating or making decisions? N  Walking or climbing stairs? N  Dressing or bathing? N  Doing errands, shopping? N  Preparing Food and eating ? N  Using the Toilet? N  In the past six months, have you accidently leaked urine? Y  Do you have problems with loss of bowel control? N  Managing your Medications? N  Managing your Finances? N  Housekeeping or managing your Housekeeping? N  Some recent data might be hidden    Fall/Depression Screening: Fall Risk  04/27/2017 03/18/2017 02/05/2017  Falls in the past year? Yes Yes Yes   Number falls in past yr: - - -  Injury with Fall? - - -  Risk Factor Category  - - -  Risk for fall due to : - - -  Follow up - - -   PHQ 2/9 Scores 04/27/2017 03/18/2017 02/05/2017 01/09/2017 12/11/2016 11/14/2016 10/23/2016  PHQ - 2 Score 0 0 0 0 0 0 0  PHQ- 9 Score - - - - - - -    Assessment: Patient continues to benefit from health coach outreach for disease management and support.    Plan:  [facility] CM Care Plan Problem One     Most Recent Value  Care Plan Problem One  Heart Failure knowledge deficit  Role Documenting the Problem One  Health Coach  Care Plan for Problem One  Active  THN Long Term Goal (31-90 days)  Patient will verbalize signs and symptoms of heart failure within 90 days.  THN Long Term Goal Start Date  04/27/17 [goal continued]  Interventions for Problem One Long Term Goal  RN Health Coach reiterated signs and symptoms of heart failure with patient.     RN Health Coach will contact patient in the month of May and patient agrees to next outreach.  Jone Baseman, RN, MSN [place] [phone]

## 2017-05-14 ENCOUNTER — Other Ambulatory Visit: Payer: Self-pay

## 2017-05-14 NOTE — Patient Outreach (Signed)
[facility]) Care Management  [place]  05/14/2017   Amy Fischer September 02, 1929 [id number]  Subjective: Telephone call to patient for case closure.  Patient reports she is doing good.  She reports she joined the [employer] and is starting back with her water walking.  Patient denies any problems with her heart failure and able to recognize symptoms.  Patient in agreement with case closure.    Objective:   Encounter Medications:  Outpatient Encounter Prescriptions as of 05/14/2017  Medication Sig Note  . acetaminophen (TYLENOL) 325 MG tablet Take 2 tablets (650 mg total) by mouth every 6 (six) hours as needed for mild pain.   [name] aspirin EC 81 MG EC tablet Take 1 tablet (81 mg total) by mouth daily. (Patient taking differently: Take 81 mg by mouth at bedtime. )   . budesonide-formoterol (SYMBICORT) 160-4.5 MCG/ACT inhaler Inhale 2 puffs into the lungs 2 (two) times daily. Or as directed (Patient taking differently: Inhale 2 puffs into the lungs 2 (two) times daily as needed. Or as directed)   . cholecalciferol (VITAMIN D) 1000 units tablet Take 1,000 Units by mouth daily. 08/08/2016: Will bring bottle in for dosing  . DiphenhydrAMINE HCl (BENADRYL PO) Take by mouth.   . docusate sodium (COLACE) 100 MG capsule Take 100 mg by mouth daily as needed for mild constipation.   [name] ezetimibe (ZETIA) 10 MG tablet TAKE 1 TABLET DAILY   . fish oil-omega-3 fatty acids 1000 MG capsule Take 1 g by mouth daily.    . folic acid (FOLVITE) 1 MG tablet TAKE 1 TABLET DAILY   . glucosamine-chondroitin 500-400 MG tablet Take 1 tablet by mouth daily.    [name] ipratropium (ATROVENT) 0.03 % nasal spray Place 2 sprays into both nostrils 3 (three) times daily as needed for rhinitis.   [name] levothyroxine (SYNTHROID, LEVOTHROID) 100 MCG tablet TAKE 1 TABLET EVERY MORNING ON AN EMPTY STOMACH   . Melatonin 3 MG TABS Take 3 mg by mouth at bedtime.    . MULTIPLE VITAMIN PO Take 1 tablet by mouth daily. 50+ Senior  Vitamin Daily   . omeprazole (PRILOSEC) 40 MG capsule TAKE 1 CAPSULE DAILY (Patient taking differently: TAKE 1 CAPSULE DAILY before dinner) 03/18/2017: Taking on occasion when acid reflux flares.   Vladimir Faster Glycol-Propyl Glycol (SYSTANE OP) Place 1 drop into both eyes 2 (two) times daily. Use 1-2 Drops in both eyes   . PROAIR HFA 108 (90 Base) MCG/ACT inhaler Inhale 2 puffs into the lungs every 6 (six) hours as needed for wheezing or shortness of breath   . rosuvastatin (CRESTOR) 5 MG tablet TAKE ONE-HALF (1/2) TABLET DAILY   . SALINE NASAL SPRAY NA Place 1 spray into both nostrils 2 (two) times daily as needed (congestion).    . vitamin E 400 UNIT capsule Take 400 Units by mouth daily.   . traMADol (ULTRAM) 50 MG tablet Take 1 tablet (50 mg total) by mouth every 6 (six) hours as needed. (Patient not taking: Reported on 03/18/2017)    No facility-administered encounter medications on file as of 05/14/2017.     Functional Status:  In your present state of health, do you have any difficulty performing the following activities: 08/08/2016  Hearing? N  Vision? N  Difficulty concentrating or making decisions? N  Walking or climbing stairs? N  Dressing or bathing? N  Doing errands, shopping? N  Preparing Food and eating ? N  Using the Toilet? N  In the past six  months, have you accidently leaked urine? Y  Do you have problems with loss of bowel control? N  Managing your Medications? N  Managing your Finances? N  Housekeeping or managing your Housekeeping? N  Some recent data might be hidden    Fall/Depression Screening: Fall Risk  04/27/2017 03/18/2017 02/05/2017  Falls in the past year? Yes Yes Yes  Number falls in past yr: - - -  Injury with Fall? - - -  Risk Factor Category  - - -  Risk for fall due to : - - -  Follow up - - -   PHQ 2/9 Scores 05/14/2017 04/27/2017 03/18/2017 02/05/2017 01/09/2017 12/11/2016 11/14/2016  PHQ - 2 Score 0 0 0 0 0 0 0  PHQ- 9 Score - - - - - - -     Assessment: Patient has met goals of care.  Plan:  [facility] CM Care Plan Problem One     Most Recent Value  Care Plan Problem One  Heart Failure knowledge deficit  Role Documenting the Problem One  Health Coach  Care Plan for Problem One  Active  [facility] Long Term Goal (31-90 days)  Patient will verbalize signs and symptoms of heart failure within 90 days.  [facility]N Long Term Goal Start Date  04/27/17  [facility]N Long Term Goal Met Date  05/14/17  Interventions for Problem One Long Term Goal  Patient know what to look for with heart failure     RN Health Coach will send closure letter to physician. RN Health Coach will notify care management assistant of case status.  Jone Baseman, RN, MSN [place] [phone]

## 2017-06-19 ENCOUNTER — Telehealth: Payer: Self-pay | Admitting: Internal Medicine

## 2017-06-19 ENCOUNTER — Other Ambulatory Visit: Payer: Self-pay | Admitting: Emergency Medicine

## 2017-06-19 MED ORDER — LEVOTHYROXINE SODIUM 100 MCG PO TABS: ORAL_TABLET | ORAL | 0 refills | Status: DC

## 2017-06-19 NOTE — Telephone Encounter (Signed)
Please advise. Okay to send in two weeks worth ?

## 2017-06-19 NOTE — Telephone Encounter (Signed)
Can send in  As much needed so she doesn run out.  2 weeks is fine.

## 2017-06-19 NOTE — Telephone Encounter (Signed)
Pt went out of town and forgot to pack her medication levothyroxine and would like to see if you could send at least a weeks worth to [facility] [address] [place], [place] [phone]

## 2017-06-19 NOTE — Telephone Encounter (Signed)
Left a detailed VM for patient regarding medication for Synthroid sent into pharmacy that patient provided

## 2017-06-21 ENCOUNTER — Other Ambulatory Visit: Payer: Self-pay | Admitting: Internal Medicine

## 2017-06-26 ENCOUNTER — Other Ambulatory Visit: Payer: Self-pay | Admitting: Internal Medicine

## 2017-07-13 ENCOUNTER — Ambulatory Visit (INDEPENDENT_AMBULATORY_CARE_PROVIDER_SITE_OTHER): Payer: Medicare Other | Admitting: Emergency Medicine

## 2017-07-13 DIAGNOSIS — M810 Age-related osteoporosis without current pathological fracture: Secondary | ICD-10-CM

## 2017-07-13 MED ORDER — DENOSUMAB 60 MG/ML ~~LOC~~ SOLN
60.0000 mg | Freq: Once | SUBCUTANEOUS | Status: AC
  Administered 2017-07-13: 60 mg via SUBCUTANEOUS

## 2017-08-17 NOTE — Progress Notes (Signed)
Chief Complaint  Patient presents with  . Follow-up    HPI: Amy Fischer 83 y.o. come in for Chronic disease management  6 mos check   Mouth salty all the time.  Not sure if important.  bp seems ok  No change in meds   Readings in 130 140 range  No syncope  Doing water walking for balance  No labs since done here  hld  No se of med  CDHF in chf clinic . hasnt seen cards in a long time  Had MVD  ROS: See pertinent positives and negatives per HPI. Feels doing ok no cp sob edema  Fevers   Past Medical History:  Diagnosis Date  . Allergy   . Anemia   . Asymptomatic carotid artery stenosis    R ICA 40% stenosis on CTA 12/2011.  . Bladder polyps   . Cataract    BILATERAL-REMOVED  . CHF (congestive heart failure) ([place])   . Diverticulosis   . Elevated blood pressure 03/25/2011   Bp readings borderline today has hx of elvation  In office and ok at home   Not checke recently   She gfeels was elevated from anxiety    . Episodic recurrent vertigo    MRI Head 2003  . Esophageal spasm   . GERD (gastroesophageal reflux disease)   . Hepatic hemangioma   . History of hepatitis    unknown type  . Hyperlipidemia   . Hyperplastic colon polyp   . Hypothyroidism   . Intestinal metaplasia of gastric mucosa   . Osteoarthritis   . Osteoporosis   . Patellar fracture 07/13/2012  . Scapular fracture 03/31/2012   small from direct blow with trip    . Subclavian steal syndrome    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; CTA 2013 revealed open graft   Past Surgical History:  Procedure Laterality Date  . APPENDECTOMY    . CARDIAC CATHETERIZATION N/A 10/04/2015   Procedure: Right/Left Heart Cath and Coronary Angiography;  Surgeon: Belva Crome, MD;  Location: [place] [facility];  Service: Cardiovascular;  Laterality: N/A;  . CAROTID-SUBCLAVIAN BYPASS GRAFT Left 1999   for [place] steal syndrome  . CHOLECYSTECTOMY    . COLONOSCOPY    . CYSTECTOMY Left    hand  . ELBOW SURGERY Left   . KNEE  SURGERY Left 2014  . MITRAL VALVE REPAIR N/A 10/10/2015   Procedure: MITRAL VALVE REPAIR (MVR) WITH SIZE 28 CARPENTIER-EDWARDS PHYSIO II ANNULOPLASTY RING;  Surgeon: [name], MD;  Location: [place];  Service: Open Heart Surgery;  Laterality: N/A;  . ROTATOR CUFF REPAIR Left   . TEAR DUCT PROBING  07/2014  . TEE WITHOUT CARDIOVERSION N/A 10/03/2015   Procedure: TRANSESOPHAGEAL ECHOCARDIOGRAM (TEE);  Surgeon: Larey Dresser, MD;  Location: [place];  Service: Cardiovascular;  Laterality: N/A;  . TEE WITHOUT CARDIOVERSION N/A 10/10/2015   Procedure: TRANSESOPHAGEAL ECHOCARDIOGRAM (TEE);  Surgeon: [name], MD;  Location: [place];  Service: Open Heart Surgery;  Laterality: N/A;  . TONSILLECTOMY    . TUBAL LIGATION       Family History  Problem Relation Age of Onset  . Hypertension Mother   . Stroke Mother   . Heart disease Father   . Colon cancer Neg Hx     Social History   Social History  . Marital status: Widowed    Spouse name: N/A  . Number of children: 4  . Years of education: N/A  Occupational History  . retired    Social History Main Topics  . Smoking status: Former Smoker    Packs/day: 0.75    Years: 20.00    Quit date: 01/05/1991  . Smokeless tobacco: Never Used  . Alcohol use 2.4 oz/week    4 Glasses of wine per week     Comment: socially  . Drug use: No  . Sexual activity: Not Asked   Other Topics Concern  . None   Social History Narrative   Widowed   HH of 1    No pets   Former smoker   Exercises regularly    Outpatient Medications Prior to Visit  Medication Sig Dispense Refill  . acetaminophen (TYLENOL) 325 MG tablet Take 2 tablets (650 mg total) by mouth every 6 (six) hours as needed for mild pain.    [name] aspirin EC 81 MG EC tablet Take 1 tablet (81 mg total) by mouth daily. (Patient taking differently: Take 81 mg by mouth at bedtime. )    . budesonide-formoterol (SYMBICORT) 160-4.5 MCG/ACT inhaler Inhale 2 puffs into the  lungs 2 (two) times daily. Or as directed (Patient taking differently: Inhale 2 puffs into the lungs 2 (two) times daily as needed. Or as directed) 1 Inhaler 2  . cholecalciferol (VITAMIN D) 1000 units tablet Take 1,000 Units by mouth daily.    . DiphenhydrAMINE HCl (BENADRYL PO) Take by mouth.    . docusate sodium (COLACE) 100 MG capsule Take 100 mg by mouth daily as needed for mild constipation.    [name] ezetimibe (ZETIA) 10 MG tablet TAKE 1 TABLET DAILY 90 tablet 0  . fish oil-omega-3 fatty acids 1000 MG capsule Take 1 g by mouth daily.     . folic acid (FOLVITE) 1 MG tablet TAKE 1 TABLET DAILY 90 tablet 0  . glucosamine-chondroitin 500-400 MG tablet Take 1 tablet by mouth daily.     [name] ipratropium (ATROVENT) 0.03 % nasal spray Place 2 sprays into both nostrils 3 (three) times daily as needed for rhinitis. 90 mL 1  . levothyroxine (SYNTHROID, LEVOTHROID) 100 MCG tablet TAKE 1 TABLET EVERY MORNING ON AN EMPTY STOMACH 90 tablet 0  . Melatonin 3 MG TABS Take 3 mg by mouth at bedtime.     . MULTIPLE VITAMIN PO Take 1 tablet by mouth daily. 50+ Senior Vitamin Daily    . omeprazole (PRILOSEC) 40 MG capsule TAKE 1 CAPSULE DAILY (Patient taking differently: TAKE 1 CAPSULE DAILY before dinner) 90 capsule 1  . Polyethyl Glycol-Propyl Glycol (SYSTANE OP) Place 1 drop into both eyes 2 (two) times daily. Use 1-2 Drops in both eyes    . PROAIR HFA 108 (90 Base) MCG/ACT inhaler Inhale 2 puffs into the lungs every 6 (six) hours as needed for wheezing or shortness of breath 1 Inhaler 2  . rosuvastatin (CRESTOR) 5 MG tablet TAKE ONE-HALF (1/2) TABLET DAILY 90 tablet 1  . SALINE NASAL SPRAY NA Place 1 spray into both nostrils 2 (two) times daily as needed (congestion).     . vitamin E 400 UNIT capsule Take 400 Units by mouth daily.    . traMADol (ULTRAM) 50 MG tablet Take 1 tablet (50 mg total) by mouth every 6 (six) hours as needed. (Patient not taking: Reported on 08/18/2017) 15 tablet 0   No facility-administered  medications prior to visit.      EXAM:  BP 130/80 (BP Location: Right Arm, Patient Position: Sitting, Cuff Size: Normal)   Pulse 96   Temp  97.7 F (36.5 C) (Oral)   Wt 119 lb (54 kg)   BMI 21.77 kg/m   Body mass index is 21.77 kg/m.  GENERAL: vitals reviewed and listed above, alert, oriented, appears well hydrated and in no acute distress HEENT: atraumatic, conjunctiva  clear, no obvious abnormalities on inspection of external nose and ears OP : no lesion edema or exudate  NECK: no obvious masses on inspection palpation  LUNGS: clear to auscultation bilaterally, no wheezes, rales or rhonchi, good air movement CV: HRRR, no clubbing cyanosis or  peripheral edema nl cap refill  MS: moves all extremities without noticeable focal  Abnormality djd changes  Ambulatory  PSYCH: pleasant and cooperative, no obvious depression or anxiety cognition appears intact   BP Readings from Last 3 Encounters:  08/18/17 130/80  02/17/17 (!) 160/74  10/14/16 138/70    ASSESSMENT AND PLAN:  Discussed the following assessment and plan:  Hypertension, isolated systolic - controlled - Plan: Basic metabolic panel, CBC with Differential/Platelet, Lipid panel, TSH, Hepatic function panel  Medication management - Plan: Basic metabolic panel, CBC with Differential/Platelet, Lipid panel, TSH, Hepatic function panel  Chronic diastolic CHF (congestive heart failure), NYHA class 2 ([place]) - Plan: Basic metabolic panel, CBC with Differential/Platelet, Lipid panel, TSH, Hepatic function panel  Osteoporosis, unspecified osteoporosis type, unspecified pathological fracture presence - on prolia   - Plan: Basic metabolic panel, CBC with Differential/Platelet, Lipid panel, TSH, Hepatic function panel, VITAMIN D 25 Hydroxy (Vit-D Deficiency, Fractures)  Thrombocytopenia (HCC) - Plan: Basic metabolic panel, CBC with Differential/Platelet, Lipid panel, TSH, Hepatic function panel  Taste sense altered  Hypothyroidism,  unspecified type  Vitamin D deficiency - Plan: VITAMIN D 25 Hydroxy (Vit-D Deficiency, Fractures) Overdue for  ;lab monitoring   bp monitoring she sent in a few months ago was reasonable for age   83 below 150 -Patient advised to return or notify health care team  if  new concerns arise.  Patient Instructions  Will notify you  of labs when available.  Not sure   Why you have salty taste in mouth .   Sometimes dry mouth gives  Funny symptoms .   If all ok then  Check  Up in  6 months       [name]. Panosh M.D. Lab Results  Component Value Date   WBC 5.8 08/18/2017   HGB 13.5 08/18/2017   HCT 41.3 08/18/2017   PLT 201.0 08/18/2017   GLUCOSE 93 08/18/2017   CHOL 159 08/18/2017   TRIG 142.0 08/18/2017   HDL 62.80 08/18/2017   LDLCALC 68 08/18/2017   ALT 13 08/18/2017   AST 17 08/18/2017   NA 141 08/18/2017   K 4.6 08/18/2017   CL 106 08/18/2017   CREATININE 0.66 08/18/2017   BUN 17 08/18/2017   CO2 33 (H) 08/18/2017   TSH 0.27 (L) 08/18/2017   INR 1.02 01/12/2016   HGBA1C 5.1 10/14/2016   See above may need to dec dose of levo will review record  Has been adjsuted ion past and tsh went way high  .

## 2017-08-18 ENCOUNTER — Encounter: Payer: Self-pay | Admitting: Internal Medicine

## 2017-08-18 ENCOUNTER — Ambulatory Visit (INDEPENDENT_AMBULATORY_CARE_PROVIDER_SITE_OTHER): Payer: Medicare Other | Admitting: Internal Medicine

## 2017-08-18 VITALS — BP 130/80 | HR 96 | Temp 97.7°F | Wt 119.0 lb

## 2017-08-18 DIAGNOSIS — I10 Essential (primary) hypertension: Secondary | ICD-10-CM | POA: Diagnosis not present

## 2017-08-18 DIAGNOSIS — Z79899 Other long term (current) drug therapy: Secondary | ICD-10-CM

## 2017-08-18 DIAGNOSIS — D696 Thrombocytopenia, unspecified: Secondary | ICD-10-CM

## 2017-08-18 DIAGNOSIS — M810 Age-related osteoporosis without current pathological fracture: Secondary | ICD-10-CM | POA: Diagnosis not present

## 2017-08-18 DIAGNOSIS — E039 Hypothyroidism, unspecified: Secondary | ICD-10-CM | POA: Diagnosis not present

## 2017-08-18 DIAGNOSIS — E559 Vitamin D deficiency, unspecified: Secondary | ICD-10-CM | POA: Diagnosis not present

## 2017-08-18 DIAGNOSIS — I5032 Chronic diastolic (congestive) heart failure: Secondary | ICD-10-CM

## 2017-08-18 DIAGNOSIS — R432 Parageusia: Secondary | ICD-10-CM | POA: Diagnosis not present

## 2017-08-18 LAB — CBC WITH DIFFERENTIAL/PLATELET
BASOS PCT: 0.8 % (ref 0.0–3.0)
Basophils Absolute: 0 10*3/uL (ref 0.0–0.1)
EOS ABS: 0.1 10*3/uL (ref 0.0–0.7)
Eosinophils Relative: 2.6 % (ref 0.0–5.0)
HCT: 41.3 % (ref 36.0–46.0)
Hemoglobin: 13.5 g/dL (ref 12.0–15.0)
Lymphocytes Relative: 24.2 % (ref 12.0–46.0)
Lymphs Abs: 1.4 10*3/uL (ref 0.7–4.0)
MCHC: 32.6 g/dL (ref 30.0–36.0)
MCV: 95.6 fl (ref 78.0–100.0)
MONO ABS: 0.6 10*3/uL (ref 0.1–1.0)
Monocytes Relative: 11 % (ref 3.0–12.0)
Neutro Abs: 3.6 10*3/uL (ref 1.4–7.7)
Neutrophils Relative %: 61.4 % (ref 43.0–77.0)
Platelets: 201 10*3/uL (ref 150.0–400.0)
RBC: 4.32 Mil/uL (ref 3.87–5.11)
RDW: 14.9 % (ref 11.5–15.5)
WBC: 5.8 10*3/uL (ref 4.0–10.5)

## 2017-08-18 LAB — BASIC METABOLIC PANEL
BUN: 17 mg/dL (ref 6–23)
CALCIUM: 9.8 mg/dL (ref 8.4–10.5)
CO2: 33 mEq/L — ABNORMAL HIGH (ref 19–32)
CREATININE: 0.66 mg/dL (ref 0.40–1.20)
Chloride: 106 mEq/L (ref 96–112)
GFR: 90.17 mL/min (ref >60.00)
Glucose, Bld: 93 mg/dL (ref 70–99)
Potassium: 4.6 mEq/L (ref 3.5–5.1)
Sodium: 141 mEq/L (ref 135–145)

## 2017-08-18 LAB — HEPATIC FUNCTION PANEL
ALBUMIN: 3.5 g/dL (ref 3.5–5.2)
ALK PHOS: 69 U/L (ref 39–117)
ALT: 13 U/L (ref 0–35)
AST: 17 U/L (ref 0–37)
Bilirubin, Direct: 0.1 mg/dL (ref 0.0–0.3)
TOTAL PROTEIN: 6.6 g/dL (ref 6.0–8.3)
Total Bilirubin: 0.6 mg/dL (ref 0.2–1.2)

## 2017-08-18 LAB — LIPID PANEL
CHOLESTEROL: 159 mg/dL (ref 0–200)
HDL: 62.8 mg/dL (ref >39.00)
LDL CALC: 68 mg/dL (ref 0–99)
NonHDL: 96.3
TRIGLYCERIDES: 142 mg/dL (ref 0.0–149.0)
Total CHOL/HDL Ratio: 3
VLDL: 28.4 mg/dL (ref 0.0–40.0)

## 2017-08-18 LAB — TSH: TSH: 0.27 u[IU]/mL — ABNORMAL LOW (ref 0.35–4.50)

## 2017-08-18 LAB — VITAMIN D 25 HYDROXY (VIT D DEFICIENCY, FRACTURES): VITD: 26.04 ng/mL — AB (ref 30.00–100.00)

## 2017-08-18 NOTE — Patient Instructions (Addendum)
Will notify you  of labs when available.  Not sure   Why you have salty taste in mouth .   Sometimes dry mouth gives  Funny symptoms .   If all ok then  Check  Up in  6 months

## 2017-08-25 ENCOUNTER — Encounter: Payer: Self-pay | Admitting: Emergency Medicine

## 2017-08-26 ENCOUNTER — Telehealth: Payer: Self-pay | Admitting: Internal Medicine

## 2017-08-26 ENCOUNTER — Other Ambulatory Visit: Payer: Self-pay | Admitting: Emergency Medicine

## 2017-08-26 DIAGNOSIS — E039 Hypothyroidism, unspecified: Secondary | ICD-10-CM

## 2017-08-26 NOTE — Telephone Encounter (Signed)
Pt is back in town and would like blood work results

## 2017-08-26 NOTE — Telephone Encounter (Signed)
Spoke with patient regarding lab results. Patient understood and will give the office a call back to schedule lab appointment. Lab order has been placed.

## 2017-09-15 ENCOUNTER — Other Ambulatory Visit (INDEPENDENT_AMBULATORY_CARE_PROVIDER_SITE_OTHER): Payer: Medicare Other

## 2017-09-15 DIAGNOSIS — E039 Hypothyroidism, unspecified: Secondary | ICD-10-CM

## 2017-09-15 LAB — TSH: TSH: 0.58 u[IU]/mL (ref 0.35–4.50)

## 2017-09-17 ENCOUNTER — Telehealth: Payer: Self-pay | Admitting: Internal Medicine

## 2017-09-17 NOTE — Telephone Encounter (Signed)
LM x 2  Notes recorded by Burnis Medin, MD on 09/16/2017 at 1:10 PM EDT tsh now in range

## 2017-09-17 NOTE — Telephone Encounter (Signed)
Pt returning your call about labs

## 2017-09-17 NOTE — Telephone Encounter (Signed)
Amy Fischer pt returned your call

## 2017-09-18 ENCOUNTER — Encounter: Payer: Self-pay | Admitting: Internal Medicine

## 2017-09-19 ENCOUNTER — Other Ambulatory Visit: Payer: Self-pay | Admitting: Internal Medicine

## 2017-09-22 NOTE — Telephone Encounter (Signed)
° °  Pt called and I gave her the results,letting her know her TSH is now in range

## 2017-09-22 NOTE — Telephone Encounter (Signed)
Noted  

## 2017-09-25 NOTE — Telephone Encounter (Signed)
Medication filled to pharmacy as requested.   

## 2017-09-26 ENCOUNTER — Other Ambulatory Visit: Payer: Self-pay | Admitting: Internal Medicine

## 2017-09-29 ENCOUNTER — Ambulatory Visit (INDEPENDENT_AMBULATORY_CARE_PROVIDER_SITE_OTHER): Payer: Medicare Other | Admitting: *Deleted

## 2017-09-29 DIAGNOSIS — Z23 Encounter for immunization: Secondary | ICD-10-CM

## 2017-09-29 NOTE — Progress Notes (Signed)
Chief Complaint  Patient presents with  . Acute Visit    Pt has a wound on her lower left leg on going for months but she noticed it has turned red and is peeling     HPI: Amy Fischer 83 y.o.  New  Problem visit he last couple months she's had an indentation on her left lower shin area. Over the last few weeks it has had some increasing redness and then scaliness and a bit tender but no discharge or rapid spread. When she came to get her flu shot yesterday the nurse suggested she come in for a visit. Doesn't bother her except that it's there when she touches it.There is no bleeding. She's been using hydrocortisone on the area. She does see a "young" Dr. Chad Cordial dermatology in the past for some lesions on her face.  No trauma  Hx  ROS: See pertinent positives and negatives per HPI. No fever swelling  No itching   Past Medical History:  Diagnosis Date  . Allergy   . Anemia   . Asymptomatic carotid artery stenosis    R ICA 40% stenosis on CTA 12/2011.  . Bladder polyps   . Cataract    BILATERAL-REMOVED  . CHF (congestive heart failure) ([place])   . Diverticulosis   . Elevated blood pressure 03/25/2011   Bp readings borderline today has hx of elvation  In office and ok at home   Not checke recently   She gfeels was elevated from anxiety    . Episodic recurrent vertigo    MRI Head 2003  . Esophageal spasm   . GERD (gastroesophageal reflux disease)   . Hepatic hemangioma   . History of hepatitis    unknown type  . Hyperlipidemia   . Hyperplastic colon polyp   . Hypothyroidism   . Intestinal metaplasia of gastric mucosa   . Osteoarthritis   . Osteoporosis   . Patellar fracture 07/13/2012  . Scapular fracture 03/31/2012   small from direct blow with trip    . Subclavian steal syndrome    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; CTA 2013 revealed open graft    Family History  Problem Relation Age of Onset  . Hypertension Mother   . Stroke Mother   . Heart  disease Father   . Colon cancer Neg Hx     Social History   Social History  . Marital status: Widowed    Spouse name: N/A  . Number of children: 4  . Years of education: N/A   Occupational History  . retired    Social History Main Topics  . Smoking status: Former Smoker    Packs/day: 0.75    Years: 20.00    Quit date: 01/05/1991  . Smokeless tobacco: Never Used  . Alcohol use 2.4 oz/week    4 Glasses of wine per week     Comment: socially  . Drug use: No  . Sexual activity: Not Asked   Other Topics Concern  . None   Social History Narrative   Widowed   HH of 1    No pets   Former smoker   Exercises regularly    Outpatient Medications Prior to Visit  Medication Sig Dispense Refill  . acetaminophen (TYLENOL) 325 MG tablet Take 2 tablets (650 mg total) by mouth every 6 (six) hours as needed for mild pain.    [name] aspirin EC 81 MG EC tablet Take 1 tablet (81 mg total) by  mouth daily. (Patient taking differently: Take 81 mg by mouth at bedtime. )    . cholecalciferol (VITAMIN D) 1000 units tablet Take 1,000 Units by mouth daily.    . DiphenhydrAMINE HCl (BENADRYL PO) Take by mouth.    . docusate sodium (COLACE) 100 MG capsule Take 100 mg by mouth daily as needed for mild constipation.    [name] ezetimibe (ZETIA) 10 MG tablet TAKE 1 TABLET DAILY 90 tablet 1  . fish oil-omega-3 fatty acids 1000 MG capsule Take 1 g by mouth daily.     . folic acid (FOLVITE) 1 MG tablet TAKE 1 TABLET DAILY 90 tablet 0  . glucosamine-chondroitin 500-400 MG tablet Take 1 tablet by mouth daily.     [name] ipratropium (ATROVENT) 0.03 % nasal spray Place 2 sprays into both nostrils 3 (three) times daily as needed for rhinitis. 90 mL 1  . levothyroxine (SYNTHROID, LEVOTHROID) 100 MCG tablet TAKE 1 TABLET EVERY MORNING ON AN EMPTY STOMACH 90 tablet 0  . Melatonin 3 MG TABS Take 3 mg by mouth at bedtime.     . MULTIPLE VITAMIN PO Take 1 tablet by mouth daily. 50+ Senior Vitamin Daily    . omeprazole (PRILOSEC)  40 MG capsule TAKE 1 CAPSULE DAILY (Patient taking differently: TAKE 1 CAPSULE DAILY before dinner) 90 capsule 1  . Polyethyl Glycol-Propyl Glycol (SYSTANE OP) Place 1 drop into both eyes 2 (two) times daily. Use 1-2 Drops in both eyes    . PROAIR HFA 108 (90 Base) MCG/ACT inhaler Inhale 2 puffs into the lungs every 6 (six) hours as needed for wheezing or shortness of breath 1 Inhaler 2  . rosuvastatin (CRESTOR) 5 MG tablet TAKE ONE-HALF (1/2) TABLET DAILY 90 tablet 1  . SALINE NASAL SPRAY NA Place 1 spray into both nostrils 2 (two) times daily as needed (congestion).     . vitamin E 400 UNIT capsule Take 400 Units by mouth daily.    . budesonide-formoterol (SYMBICORT) 160-4.5 MCG/ACT inhaler Inhale 2 puffs into the lungs 2 (two) times daily. Or as directed (Patient not taking: Reported on 09/30/2017) 1 Inhaler 2  . traMADol (ULTRAM) 50 MG tablet Take 1 tablet (50 mg total) by mouth every 6 (six) hours as needed. (Patient not taking: Reported on 08/18/2017) 15 tablet 0   No facility-administered medications prior to visit.      EXAM:  BP 138/70 (BP Location: Right Arm, Patient Position: Sitting, Cuff Size: Normal)   Pulse 74   Temp 97.9 F (36.6 C) (Oral)   Ht 5\' 2"  (1.575 m)   Wt 117 lb (53.1 kg)   SpO2 95%   BMI 21.40 kg/m   Body mass index is 21.4 kg/m.  GENERAL: vitals reviewed and listed above, alert, oriented, appears well hydrated and in no acute distress HEENT: atraumatic, conjunctiva  clear, no obvious abnormalities on inspection of external nose and ears MS: moves all extremities   Abnormality  djd deformity  Left latera mid leg with 2-3 dm red pink  Lesion with central scaling and  Cavitation  But no dc or edema    Mildly tender to touch    Not effecting gait or station PSYCH: pleasant and cooperative, cognition intact   ASSESSMENT AND PLAN:  Discussed the following assessment and plan:  Skin lesion  - concerning for  skin cancer  by context  need derm opinon and  rx Poss  Skin neoplasm ? n    Will have her call her derm first  And  if not  Getting appt contact us for  Help  In interim stop hcs and add Bactroban   Expectant management. And follow through  Fu for alarm sx in the interim  -Patient advised to return or notify health care team  if symptoms worsen ,persist or new concerns arise.  Patient Instructions  Concerned that this could be a skin cancer. Stop the hydrocortisone begin the topical antibiotic twice a day. Call your dermatology office for an appointment referred in by your primary doctor. Contact us if we need to help you getting that appointment or referral.     [name]. Panosh M.D.

## 2017-09-30 ENCOUNTER — Encounter: Payer: Self-pay | Admitting: Internal Medicine

## 2017-09-30 ENCOUNTER — Ambulatory Visit (INDEPENDENT_AMBULATORY_CARE_PROVIDER_SITE_OTHER): Payer: Medicare Other | Admitting: Internal Medicine

## 2017-09-30 VITALS — BP 138/70 | HR 74 | Temp 97.9°F | Ht 62.0 in | Wt 117.0 lb

## 2017-09-30 DIAGNOSIS — L989 Disorder of the skin and subcutaneous tissue, unspecified: Secondary | ICD-10-CM

## 2017-09-30 MED ORDER — MUPIROCIN CALCIUM 2 % EX CREA: 1.0000 "application " | TOPICAL_CREAM | Freq: Two times a day (BID) | CUTANEOUS | 1 refills | Status: DC

## 2017-09-30 NOTE — Patient Instructions (Signed)
Concerned that this could be a skin cancer. Stop the hydrocortisone begin the topical antibiotic twice a day. Call your dermatology office for an appointment referred in by your primary doctor. Contact us if we need to help you getting that appointment or referral.

## 2017-10-02 DIAGNOSIS — L570 Actinic keratosis: Secondary | ICD-10-CM | POA: Diagnosis not present

## 2017-10-02 DIAGNOSIS — D485 Neoplasm of uncertain behavior of skin: Secondary | ICD-10-CM | POA: Diagnosis not present

## 2017-11-12 DIAGNOSIS — Z961 Presence of intraocular lens: Secondary | ICD-10-CM | POA: Diagnosis not present

## 2017-11-12 DIAGNOSIS — H532 Diplopia: Secondary | ICD-10-CM | POA: Diagnosis not present

## 2017-11-30 NOTE — Progress Notes (Signed)
Chief Complaint  Patient presents with  . Cough    Cough and congestion x 2 weeks. Pt states that she getting yellow/brown mucus in the beginning, but not able to get mucus up now. Pt has taken Mucinex w/Exp, Nyquil, Zinc and Zycam. Denies fever.     HPI: Amy Fischer 83 y.o. sda   2 weeks  Of sx and settling in sinuses again.    Taking otc meds  Not helping   Describes a bad head cold that never went away with sinus pressure and pain like her teeth were hurting.  The sinus pain is improved but has continued severe head congestion postnasal drainage and a cough.  No associated fever or shortness of breath. She thinks she has had sinus problems ever since she was a little girl at some point was on allergy shots which helped her greatly.  But was told she did not need them anymore. Over the last few years every time she gets a cold she gets a sinus infection.  She does have a chronic runny nose.  We have tried using Atrovent at times with some help.     ROS: See pertinent positives and negatives per HPI.  Past Medical History:  Diagnosis Date  . Allergy   . Anemia   . Asymptomatic carotid artery stenosis    R ICA 40% stenosis on CTA 12/2011.  . Bladder polyps   . Cataract    BILATERAL-REMOVED  . CHF (congestive heart failure) ([place])   . Diverticulosis   . Elevated blood pressure 03/25/2011   Bp readings borderline today has hx of elvation  In office and ok at home   Not checke recently   She gfeels was elevated from anxiety    . Episodic recurrent vertigo    MRI Head 2003  . Esophageal spasm   . GERD (gastroesophageal reflux disease)   . Hepatic hemangioma   . History of hepatitis    unknown type  . Hyperlipidemia   . Hyperplastic colon polyp   . Hypothyroidism   . Intestinal metaplasia of gastric mucosa   . Osteoarthritis   . Osteoporosis   . Patellar fracture 07/13/2012  . Scapular fracture 03/31/2012   small from direct blow with trip    . Subclavian steal syndrome      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; CTA 2013 revealed open graft    Family History  Problem Relation Age of Onset  . Hypertension Mother   . Stroke Mother   . Heart disease Father   . Colon cancer Neg Hx     Social History   Socioeconomic History  . Marital status: Widowed    Spouse name: None  . Number of children: 4  . Years of education: None  . Highest education level: None  Social Needs  . Financial resource strain: None  . Food insecurity - worry: None  . Food insecurity - inability: None  . Transportation needs - medical: None  . Transportation needs - non-medical: None  Occupational History  . Occupation: retired  Tobacco Use  . Smoking status: Former Smoker    Packs/day: 0.75    Years: 20.00    Pack years: 15.00    Last attempt to quit: 01/05/1991    Years since quitting: 26.9  . Smokeless tobacco: Never Used  Substance and Sexual Activity  . Alcohol use: Yes    Alcohol/week: 2.4 oz    Types: 4 Glasses of wine per week  Comment: socially  . Drug use: No  . Sexual activity: None  Other Topics Concern  . None  Social History Narrative   Widowed   HH of 1    No pets   Former smoker   Exercises regularly    Outpatient Medications Prior to Visit  Medication Sig Dispense Refill  . acetaminophen (TYLENOL) 325 MG tablet Take 2 tablets (650 mg total) by mouth every 6 (six) hours as needed for mild pain.    [name] aspirin EC 81 MG EC tablet Take 1 tablet (81 mg total) by mouth daily. (Patient taking differently: Take 81 mg by mouth at bedtime. )    . budesonide-formoterol (SYMBICORT) 160-4.5 MCG/ACT inhaler Inhale 2 puffs into the lungs 2 (two) times daily. Or as directed 1 Inhaler 2  . cholecalciferol (VITAMIN D) 1000 units tablet Take 1,000 Units by mouth daily.    . DiphenhydrAMINE HCl (BENADRYL PO) Take by mouth.    . docusate sodium (COLACE) 100 MG capsule Take 100 mg by mouth daily as needed for mild constipation.    [name] ezetimibe (ZETIA) 10 MG  tablet TAKE 1 TABLET DAILY 90 tablet 1  . fish oil-omega-3 fatty acids 1000 MG capsule Take 1 g by mouth daily.     . folic acid (FOLVITE) 1 MG tablet TAKE 1 TABLET DAILY 90 tablet 0  . glucosamine-chondroitin 500-400 MG tablet Take 1 tablet by mouth daily.     [name] ipratropium (ATROVENT) 0.03 % nasal spray Place 2 sprays into both nostrils 3 (three) times daily as needed for rhinitis. 90 mL 1  . levothyroxine (SYNTHROID, LEVOTHROID) 100 MCG tablet TAKE 1 TABLET EVERY MORNING ON AN EMPTY STOMACH 90 tablet 0  . Melatonin 3 MG TABS Take 3 mg by mouth at bedtime.     . MULTIPLE VITAMIN PO Take 1 tablet by mouth daily. 50+ Senior Vitamin Daily    . mupirocin cream (BACTROBAN) 2 % Apply 1 application topically 2 (two) times daily. 15 g 1  . omeprazole (PRILOSEC) 40 MG capsule TAKE 1 CAPSULE DAILY (Patient taking differently: TAKE 1 CAPSULE DAILY before dinner) 90 capsule 1  . Polyethyl Glycol-Propyl Glycol (SYSTANE OP) Place 1 drop into both eyes 2 (two) times daily. Use 1-2 Drops in both eyes    . PROAIR HFA 108 (90 Base) MCG/ACT inhaler Inhale 2 puffs into the lungs every 6 (six) hours as needed for wheezing or shortness of breath 1 Inhaler 2  . rosuvastatin (CRESTOR) 5 MG tablet TAKE ONE-HALF (1/2) TABLET DAILY 90 tablet 1  . SALINE NASAL SPRAY NA Place 1 spray into both nostrils 2 (two) times daily as needed (congestion).     . traMADol (ULTRAM) 50 MG tablet Take 1 tablet (50 mg total) by mouth every 6 (six) hours as needed. 15 tablet 0  . vitamin E 400 UNIT capsule Take 400 Units by mouth daily.     No facility-administered medications prior to visit.      EXAM:  BP 132/82 (BP Location: Right Arm, Patient Position: Sitting, Cuff Size: Normal)   Pulse 86   Temp 97.9 F (36.6 C) (Oral)   Wt 117 lb 9.6 oz (53.3 kg)   BMI 21.51 kg/m   Body mass index is 21.51 kg/m. WDWN in NAD  quiet respirations;MODERATELY congested  somewhat hoarse. Non toxic . COUGH  HEENT: Normocephalic ;atraumatic ,  Eyes;  PERRL, EOMs  Full, lids and conjunctiva clear,,Ears: no deformities, canals nl, TM landmarks normal, Nose: no deformity or discharge  but congested;face minimally tender Mouth : OP  Thick yellow PND   without lesion or edema . Neck: Supple without adenopathy or masses or bruits Chest:  Clear to A&without wheezes rales or rhonchi CV:  S1-S2 no gallops or murmurs peripheral perfusion is normal Skin :nl perfusion and no acute rashes    ASSESSMENT AND PLAN:  Discussed the following assessment and plan:  Recurrent sinus infections - Plan: Ambulatory referral to Allergy  Chronic sinus complaints - Plan: Ambulatory referral to Allergy  History of allergy - Plan: Ambulatory referral to Allergy Apparent sinusitis secondary with underlying chronic congestion.  Remote history of diagnosed allergies that responded to  desensitization. Uncertain underlying cause and she may have aged out of her allergies but does have chronic problem without obvious obstruction. Get allergy consult as to opinion if there is underlying allergy that is triggering these problems. -Patient advised to return or notify health care team  if symptoms worsen ,persist or new concerns arise.  Patient Instructions   Treating   this sinus  infection . With antibiotic   If not improving  Consider adding prednisone .    Lets go back to allergy and see if they can help with these recurrent  Symptoms    You will be contact about referral . Lung exam is good todaY    Mariann Laster K. Tyreece Gelles M.D.

## 2017-12-01 ENCOUNTER — Ambulatory Visit (INDEPENDENT_AMBULATORY_CARE_PROVIDER_SITE_OTHER): Payer: Medicare Other | Admitting: Internal Medicine

## 2017-12-01 ENCOUNTER — Encounter: Payer: Self-pay | Admitting: Internal Medicine

## 2017-12-01 VITALS — BP 132/82 | HR 86 | Temp 97.9°F | Wt 117.6 lb

## 2017-12-01 DIAGNOSIS — R0989 Other specified symptoms and signs involving the circulatory and respiratory systems: Secondary | ICD-10-CM

## 2017-12-01 DIAGNOSIS — J329 Chronic sinusitis, unspecified: Secondary | ICD-10-CM

## 2017-12-01 DIAGNOSIS — Z889 Allergy status to unspecified drugs, medicaments and biological substances status: Secondary | ICD-10-CM | POA: Diagnosis not present

## 2017-12-01 MED ORDER — DOXYCYCLINE HYCLATE 100 MG PO TABS: 100.0000 mg | ORAL_TABLET | Freq: Two times a day (BID) | ORAL | 0 refills | Status: DC

## 2017-12-01 NOTE — Patient Instructions (Addendum)
  Treating   this sinus  infection . With antibiotic   If not improving  Consider adding prednisone .    Lets go back to allergy and see if they can help with these recurrent  Symptoms    You will be contact about referral . Lung exam is good todaY

## 2017-12-17 DIAGNOSIS — D485 Neoplasm of uncertain behavior of skin: Secondary | ICD-10-CM | POA: Diagnosis not present

## 2017-12-17 DIAGNOSIS — L821 Other seborrheic keratosis: Secondary | ICD-10-CM | POA: Diagnosis not present

## 2017-12-17 DIAGNOSIS — D0472 Carcinoma in situ of skin of left lower limb, including hip: Secondary | ICD-10-CM | POA: Diagnosis not present

## 2017-12-19 ENCOUNTER — Other Ambulatory Visit: Payer: Self-pay | Admitting: Internal Medicine

## 2018-01-07 ENCOUNTER — Encounter: Payer: Self-pay | Admitting: Internal Medicine

## 2018-01-07 LAB — SKIN EXCISION

## 2018-01-14 DIAGNOSIS — C44729 Squamous cell carcinoma of skin of left lower limb, including hip: Secondary | ICD-10-CM | POA: Diagnosis not present

## 2018-01-14 DIAGNOSIS — Z85828 Personal history of other malignant neoplasm of skin: Secondary | ICD-10-CM | POA: Diagnosis not present

## 2018-01-26 ENCOUNTER — Encounter: Payer: Self-pay | Admitting: Allergy and Immunology

## 2018-01-26 ENCOUNTER — Ambulatory Visit (INDEPENDENT_AMBULATORY_CARE_PROVIDER_SITE_OTHER): Payer: Medicare Other | Admitting: Allergy and Immunology

## 2018-01-26 VITALS — BP 120/64 | HR 96 | Resp 16

## 2018-01-26 DIAGNOSIS — J3089 Other allergic rhinitis: Secondary | ICD-10-CM | POA: Diagnosis not present

## 2018-01-26 DIAGNOSIS — K219 Gastro-esophageal reflux disease without esophagitis: Secondary | ICD-10-CM

## 2018-01-26 DIAGNOSIS — J438 Other emphysema: Secondary | ICD-10-CM | POA: Diagnosis not present

## 2018-01-26 MED ORDER — IPRATROPIUM BROMIDE 0.06 % NA SOLN: 1.0000 | Freq: Two times a day (BID) | NASAL | 5 refills | Status: DC | PRN

## 2018-01-26 NOTE — Patient Instructions (Addendum)
  1.  Allergen avoidance measures  2.  Treat and prevent inflammation:   A.  OTC Nasacort -1 spray each nostril 1 time per day  3.  If needed:   A.  Atrovent 0.06% -1-2 sprays each nostril twice a day  B.  OTC Benadryl  C.  Nasal saline spray  4.  Treat and prevent reflux:   A.  Omeprazole 40 mg tablet 1 time per day  B.  Consolidate caffeine use  5.  Return to clinic in 4 weeks or earlier if problem

## 2018-01-26 NOTE — Progress Notes (Signed)
Dear Dr. Regis Bill,  Thank you for referring Amy Fischer to the [facility] on 01/26/2018.   Below is a summation of this patient's evaluation and recommendations.  Thank you for your referral. I will keep you informed about this patient's response to treatment.   If you have any questions please do not hesitate to contact me.   Sincerely,  Jiles Prows, MD Allergy / Immunology [place]   ______________________________________________________________________    NEW PATIENT NOTE  Referring Provider: Burnis Medin, MD Primary Provider: Burnis Medin, MD Date of office visit: 01/26/2018    Subjective:   Chief Complaint:  Amy Fischer (DOB: November 18, 1929) is a 84 y.o. female who presents to the clinic on 01/26/2018 with a chief complaint of Allergies and Asthma .     HPI: Amy Fischer presents to this clinic in evaluation of allergies.  She has a long history of developing problems with runny nose and sneezing occurring on a perennial basis without any obvious trigger.  She gets significant nasal congestion at nighttime.  She does not have any associated ugly nasal discharge or anosmia or headache.  She has been prescribed ipratropium nasal spray which does help her runny nose.  She uses this medication at 1 spray each nostril twice a day.  She takes Benadryl at nighttime which helps her nasal congestion.  About 2 times per years she will develop a "cold".  This cold usually last several weeks and usually requires the administration of an antibiotic. Sometime in the past 2 years she was given inhalers to use around the point in time in which she develops a cold because she developed significant coughing and was diagnosed with "bronchitis".  This has not been a recent phenomena and she has not used her inhalers in greater than a year.  She does have a history of smoking greater than 20  years but she discontinued this hobby back in 1993.  She does have reflux disease especially if she eats spicy food.  She has regurgitation and burning.  She will intermittently utilize omeprazole when she goes out to eat a spicy meal.  She does consume coffee in the morning but no other forms of caffeine throughout the day.  Past Medical History:  Diagnosis Date  . Allergy   . Anemia   . Asymptomatic carotid artery stenosis    R ICA 40% stenosis on CTA 12/2011.  . Bladder polyps   . Cataract    BILATERAL-REMOVED  . CHF (congestive heart failure) ([place])   . Diverticulosis   . Elevated blood pressure 03/25/2011   Bp readings borderline today has hx of elvation  In office and ok at home   Not checke recently   She gfeels was elevated from anxiety    . Episodic recurrent vertigo    MRI Head 2003  . Esophageal spasm   . GERD (gastroesophageal reflux disease)   . Hepatic hemangioma   . History of hepatitis    unknown type  . Hyperlipidemia   . Hyperplastic colon polyp   . Hypothyroidism   . Intestinal metaplasia of gastric mucosa   . Osteoarthritis   . Osteoporosis   . Patellar fracture 07/13/2012  . Scapular fracture 03/31/2012   small from direct blow with trip    . Subclavian steal syndrome    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; CTA 2013 revealed open  graft    Past Surgical History:  Procedure Laterality Date  . APPENDECTOMY    . CARDIAC CATHETERIZATION N/A 10/04/2015   Procedure: Right/Left Heart Cath and Coronary Angiography;  Surgeon: Belva Crome, MD;  Location: [facility];  Service: Cardiovascular;  Laterality: N/A;  . CAROTID-SUBCLAVIAN BYPASS GRAFT Left 1999   for [place] steal syndrome  . CHOLECYSTECTOMY    . COLONOSCOPY    . CYSTECTOMY Left    hand  . ELBOW SURGERY Left   . KNEE SURGERY Left 2014  . MITRAL VALVE REPAIR N/A 10/10/2015   Procedure: MITRAL VALVE REPAIR (MVR) WITH SIZE 28 CARPENTIER-EDWARDS PHYSIO II ANNULOPLASTY RING;  Surgeon: [name], MD;  Location: [facility];  Service: Open Heart Surgery;  Laterality: N/A;  . ROTATOR CUFF REPAIR Left   . TEAR DUCT PROBING  07/2014  . TEE WITHOUT CARDIOVERSION N/A 10/03/2015   Procedure: TRANSESOPHAGEAL ECHOCARDIOGRAM (TEE);  Surgeon: Larey Dresser, MD;  Location: [facility];  Service: Cardiovascular;  Laterality: N/A;  . TEE WITHOUT CARDIOVERSION N/A 10/10/2015   Procedure: TRANSESOPHAGEAL ECHOCARDIOGRAM (TEE);  Surgeon: [name], MD;  Location: [facility];  Service: Open Heart Surgery;  Laterality: N/A;  . TONSILLECTOMY    . TUBAL LIGATION      Allergies as of 01/26/2018      Reactions   Codeine Nausea And Vomiting   Risedronate Sodium    Upset stomach. Could take Fosamax.   Statins    Muscles hurt, can take low dose   Tape Other (See Comments)   Blisters, Please use "paper" tape   Amoxicillin-pot Clavulanate Diarrhea   Not allergic  2014, Pt can take z pack only Not allergic  2014, Pt can take z pack only      Medication List      acetaminophen 325 MG tablet Commonly known as:  TYLENOL Take 2 tablets (650 mg total) by mouth every 6 (six) hours as needed for mild pain.   aspirin 81 MG EC tablet Take 1 tablet (81 mg total) by mouth daily.   BENADRYL PO Take by mouth.   budesonide-formoterol 160-4.5 MCG/ACT inhaler Commonly known as:  SYMBICORT Inhale 2 puffs into the lungs 2 (two) times daily. Or as directed   cholecalciferol 1000 units tablet Commonly known as:  VITAMIN D Take 1,000 Units by mouth daily.   docusate sodium 100 MG capsule Commonly known as:  COLACE Take 100 mg by mouth daily as needed for mild constipation.   doxycycline 100 MG tablet Commonly known as:  VIBRA-TABS Take 1 tablet (100 mg total) by mouth 2 (two) times daily.   ezetimibe 10 MG tablet Commonly known as:  ZETIA TAKE 1 TABLET DAILY   fish oil-omega-3 fatty acids 1000 MG capsule Take 1 g by mouth daily.   folic acid 1 MG tablet Commonly known as:   FOLVITE TAKE 1 TABLET DAILY   glucosamine-chondroitin 500-400 MG tablet Take 1 tablet by mouth daily.   ipratropium 0.03 % nasal spray Commonly known as:  ATROVENT Place 2 sprays into both nostrils 3 (three) times daily as needed for rhinitis.   levothyroxine 100 MCG tablet Commonly known as:  SYNTHROID, LEVOTHROID TAKE 1 TABLET EVERY MORNING ON AN EMPTY STOMACH   Melatonin 3 MG Tabs Take 3 mg by mouth at bedtime.   MULTIPLE VITAMIN PO Take 1 tablet by mouth daily. 50+ Senior Vitamin Daily   mupirocin cream 2 % Commonly known as:  BACTROBAN Apply 1 application topically 2 (  two) times daily.   omeprazole 40 MG capsule Commonly known as:  PRILOSEC TAKE 1 CAPSULE DAILY   PROAIR HFA 108 (90 Base) MCG/ACT inhaler Generic drug:  albuterol Inhale 2 puffs into the lungs every 6 (six) hours as needed for wheezing or shortness of breath   rosuvastatin 5 MG tablet Commonly known as:  CRESTOR TAKE ONE-HALF (1/2) TABLET DAILY   SALINE NASAL SPRAY NA Place 1 spray into both nostrils 2 (two) times daily as needed (congestion).   SYSTANE OP Place 1 drop into both eyes 2 (two) times daily. Use 1-2 Drops in both eyes   traMADol 50 MG tablet Commonly known as:  ULTRAM Take 1 tablet (50 mg total) by mouth every 6 (six) hours as needed.   vitamin E 400 UNIT capsule Take 400 Units by mouth daily.       Review of systems negative except as noted in HPI / PMHx or noted below:  Review of Systems  Constitutional: Negative.   HENT: Negative.   Eyes: Negative.   Respiratory: Negative.   Cardiovascular: Negative.   Gastrointestinal: Negative.   Genitourinary: Negative.   Musculoskeletal: Negative.   Skin: Negative.   Neurological: Negative.   Endo/Heme/Allergies: Negative.   Psychiatric/Behavioral: Negative.     Family History  Problem Relation Age of Onset  . Hypertension Mother   . Stroke Mother   . Heart disease Father   . Colon cancer Neg Hx     Social History    Socioeconomic History  . Marital status: Widowed    Spouse name: Not on file  . Number of children: 4  . Years of education: Not on file  . Highest education level: Not on file  Social Needs  . Financial resource strain: Not on file  . Food insecurity - worry: Not on file  . Food insecurity - inability: Not on file  . Transportation needs - medical: Not on file  . Transportation needs - non-medical: Not on file  Occupational History  . Occupation: retired  Tobacco Use  . Smoking status: Former Smoker    Packs/day: 0.75    Years: 20.00    Pack years: 15.00    Last attempt to quit: 01/05/1991    Years since quitting: 27.0  . Smokeless tobacco: Never Used  Substance and Sexual Activity  . Alcohol use: Yes    Alcohol/week: 2.4 oz    Types: 4 Glasses of wine per week    Comment: socially  . Drug use: No  . Sexual activity: Not on file  Other Topics Concern  . 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  Objective:   Vitals:   01/26/18 1420  BP: 120/64  Pulse: 96  Resp: 16        Physical Exam  Constitutional: She is well-developed, well-nourished, and in no distress.  HENT:  Head: Normocephalic.  Right Ear: Tympanic membrane, external ear and ear canal normal.  Left Ear: Tympanic membrane, external ear and ear canal normal.  Nose: Nose normal. No mucosal edema or rhinorrhea.  Mouth/Throat: Uvula is midline, oropharynx is clear and moist and mucous membranes are  normal. No oropharyngeal exudate.  Eyes: Conjunctivae are normal.  Neck: Trachea normal. No tracheal tenderness present. No tracheal deviation present. No thyromegaly present.  Cardiovascular: Normal  rate, regular rhythm, S1 normal, S2 normal and normal heart sounds.  No murmur heard. Pulmonary/Chest: Breath sounds normal. No stridor. No respiratory distress. She has no wheezes. She has no rales.  Musculoskeletal: She exhibits no edema.  Lymphadenopathy:       Head (right side): No tonsillar adenopathy present.       Head (left side): No tonsillar adenopathy present.    She has no cervical adenopathy.  Neurological: She is alert. Gait normal.  Skin: No rash noted. She is not diaphoretic. No erythema. Nails show no clubbing.  Psychiatric: Mood and affect normal.    Diagnostics: Allergy skin tests were performed.  She did not demonstrate any hypersensitivity against a screening panel of aeroallergens or foods.  Results of a head CT scan obtained 12 January 2016 identified the following:  There is slight prominence of the ventricles and sulci compatible with age-related volume loss. Mild periventricular and deep white matter hypodensities represent chronic microvascular ischemic changes. There is no intracranial hemorrhage. No mass effect or midline shift identified.  The visualized paranasal sinuses and mastoid air cells are well aerated. The calvarium is intact  Results of a CT scan of her chest with angiography identified the following:  There is no demonstrable pulmonary embolus. There is atherosclerotic change in the thoracic aorta without aneurysm. There is atherosclerotic change in the visualized great vessels without high-grade obstruction appreciable.  There are small pleural effusions bilaterally. There is underlying centrilobular emphysematous change. There is interstitial and patchy alveolar edema bilaterally.  There is reflux of contrast into the inferior vena cava and hepatic veins. Heart is slightly enlarged. There is no appreciable pericardial effusion. There are scattered foci of coronary artery Calcification.  Results of an echo performed 06 May 2016  identified the following:  - Left ventricle: The cavity size was normal. Wall thickness was   normal. Systolic function was normal. The estimated ejection   fraction was in the range of 55% to 60%. Wall motion was normal;   there were no regional wall motion abnormalities. Doppler   parameters are consistent with abnormal left ventricular   relaxation (grade 1 diastolic dysfunction). Doppler parameters   are consistent with high ventricular filling pressure. - Mitral valve: Prior procedures included surgical repair. Valve   area by continuity equation (using LVOT flow): 1.86 cm^2. - Left atrium: The atrium was mildly dilated. - Pulmonary arteries: Systolic pressure was increased.   Assessment and Plan:    1. Other allergic rhinitis   2. Gastroesophageal reflux disease, esophagitis presence not specified   3. Other emphysema ([place])     1.  Allergen avoidance measures?  2.  Treat and prevent inflammation:   A.  OTC Nasacort -1 spray each nostril 1 time per day  3.  If needed:   A.  Atrovent 0.06% -1-2 sprays each nostril twice a day  B.  OTC Benadryl  C.  Nasal saline spray  4.  Treat and prevent reflux:   A.  Omeprazole 40 mg tablet 1 time per day  B.  Consolidate caffeine use  5.  Return to clinic in 4 weeks or earlier if problem  Amy Fischer has inflammation of her upper airway but this may not be based upon an atopic immune system.  I will have her start a nasal steroid on a regular basis in  addition to the as needed use of nasal ipratropium and antihistamines.  In addition, she does appear to have an issue with reflux and I have asked her to consolidate her caffeine consumption aiming for no use over the course of the next several weeks and also use a proton pump inhibitor.  She does appear to have some emphysematous changes in her lung architecture which is probably on the basis of her lungs tobacco use many years ago.  She does not appear to have any significant lower airways  altering her ability to perform activities of daily living at this point.  I will regroup with her in approximately 4 weeks or earlier if there is a problem.  Jiles Prows, MD Allergy / Immunology [facility]

## 2018-01-27 ENCOUNTER — Other Ambulatory Visit: Payer: Self-pay | Admitting: Internal Medicine

## 2018-01-27 ENCOUNTER — Encounter: Payer: Self-pay | Admitting: Allergy and Immunology

## 2018-01-27 ENCOUNTER — Ambulatory Visit (INDEPENDENT_AMBULATORY_CARE_PROVIDER_SITE_OTHER): Payer: Medicare Other

## 2018-01-27 DIAGNOSIS — M810 Age-related osteoporosis without current pathological fracture: Secondary | ICD-10-CM

## 2018-01-27 DIAGNOSIS — Q782 Osteopetrosis: Secondary | ICD-10-CM

## 2018-01-27 MED ORDER — DENOSUMAB 60 MG/ML ~~LOC~~ SOLN
60.0000 mg | Freq: Once | SUBCUTANEOUS | Status: AC
  Administered 2018-01-27: 60 mg via SUBCUTANEOUS

## 2018-01-27 NOTE — Progress Notes (Signed)
Pt made her nurse visit appointment for her prolia injection today without prior insurance verification. Pt was advise to reschedule appointment so that her insurance verification process could be initiated. Pt refused and stated that " nothing has changed and I never have to pay for my shot" pt was informed that the office is not responsible for any cost of injection that is not covered by her insurance. Pt agreed to get injection today. Injection was last given SQ on her right arm, she had a allergy appointment yesterday and received several shots on her left arm. Pt requested prolia injection to be given in her left arm today. No s/s of distress noted. Pt tolerated injection well.  Celso Amy

## 2018-02-01 NOTE — Progress Notes (Signed)
Chief Complaint  Patient presents with  . Follow-up    labs today. pt states that dry mouth is improved.    HPI: Amy Fischer 84 y.o. comes in today for \\yearly  visit and Chronic disease management .  CV:  No cp sob   Dr Carmelina Peal thought she had irreg heart beat   [place]   Not felt to be allergic   Not sure.   Placed on a different  medication  Leg lesion   Cancer removed but still healing and  Had to stop water walking exercise which was helpful   No cp syncope  Swelling   Still on  prolia    Health Maintenance  Topic Date Due  . TETANUS/TDAP  09/22/2021  . INFLUENZA VACCINE  Completed  . DEXA SCAN  Completed  . PNA vac Low Risk Adult  Completed   ROS:  GEN/ HEENT: No fever, significant weight changes sweats headaches vision problems hearing changes, CV/ PULM; No chest pain shortness of breath cough, syncope,edema  cGI /GU: No adominal pain, vomiting, change in bowel habits. . No significant GU symptoms. SKIN/HEME: ,no acute skin rashes suspicious lesions or bleeding. No lymphadenopathy, nodules, masses.  NEURO/ PSYCH:  No  New neurologic signs such as weakness numbness. No depression anxiety. IMM/ Allergy: No unusual infections.  Allergy .  Sx under evaluation  REST of 12 system review negative except as per HPI   Past Medical History:  Diagnosis Date  . Allergy   . Anemia   . Asymptomatic carotid artery stenosis    R ICA 40% stenosis on CTA 12/2011.  . Bladder polyps   . Cataract    BILATERAL-REMOVED  . CHF (congestive heart failure) ([place])   . Diverticulosis   . Elevated blood pressure 03/25/2011   Bp readings borderline today has hx of elvation  In office and ok at home   Not checke recently   She gfeels was elevated from anxiety    . Episodic recurrent vertigo    MRI Head 2003  . Esophageal spasm   . GERD (gastroesophageal reflux disease)   . Hepatic hemangioma   . History of hepatitis    unknown type  . Hyperlipidemia   . Hyperplastic colon polyp     . Hypothyroidism   . Intestinal metaplasia of gastric mucosa   . Osteoarthritis   . Osteoporosis   . Patellar fracture 07/13/2012  . Scapular fracture 03/31/2012   small from direct blow with trip    . Subclavian steal syndrome    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; CTA 2013 revealed open graft    Family History  Problem Relation Age of Onset  . Hypertension Mother   . Stroke Mother   . Heart disease Father   . Colon cancer Neg Hx     Social History   Socioeconomic History  . Marital status: Widowed    Spouse name: None  . Number of children: 4  . Years of education: None  . Highest education level: None  Social Needs  . Financial resource strain: None  . Food insecurity - worry: None  . Food insecurity - inability: None  . Transportation needs - medical: None  . Transportation needs - non-medical: None  Occupational History  . Occupation: retired  Tobacco Use  . Smoking status: Former Smoker    Packs/day: 0.75    Years: 20.00    Pack years: 15.00    Last attempt to quit: 01/05/1991  Years since quitting: 27.0  . Smokeless tobacco: Never Used  Substance and Sexual Activity  . Alcohol use: Yes    Alcohol/week: 2.4 oz    Types: 4 Glasses of wine per week    Comment: socially  . Drug use: No  . Sexual activity: None  Other Topics Concern  . None  Social History Narrative   Widowed   HH of 1    No pets   Former smoker   Exercises regularly    Outpatient Encounter Medications as of 02/02/2018  Medication Sig  . acetaminophen (TYLENOL) 325 MG tablet Take 2 tablets (650 mg total) by mouth every 6 (six) hours as needed for mild pain.  [name] aspirin EC 81 MG EC tablet Take 1 tablet (81 mg total) by mouth daily. (Patient taking differently: Take 81 mg by mouth at bedtime. )  . cholecalciferol (VITAMIN D) 1000 units tablet Take 1,000 Units by mouth daily.  . DiphenhydrAMINE HCl (BENADRYL PO) Take by mouth.  . docusate sodium (COLACE) 100 MG capsule Take 100 mg  by mouth daily as needed for mild constipation.  [name] ezetimibe (ZETIA) 10 MG tablet TAKE 1 TABLET DAILY  . fish oil-omega-3 fatty acids 1000 MG capsule Take 1 g by mouth daily.   . folic acid (FOLVITE) 1 MG tablet TAKE 1 TABLET DAILY  . glucosamine-chondroitin 500-400 MG tablet Take 1 tablet by mouth daily.   [name] ipratropium (ATROVENT) 0.06 % nasal spray Place 1-2 sprays into both nostrils 2 (two) times daily as needed for rhinitis.  [name] levothyroxine (SYNTHROID, LEVOTHROID) 100 MCG tablet TAKE 1 TABLET EVERY MORNING ON AN EMPTY STOMACH  . Melatonin 3 MG TABS Take 3 mg by mouth at bedtime.   . MULTIPLE VITAMIN PO Take 1 tablet by mouth daily. 50+ Senior Vitamin Daily  . omeprazole (PRILOSEC) 40 MG capsule TAKE 1 CAPSULE DAILY (Patient taking differently: TAKE 1 CAPSULE DAILY before dinner)  . Polyethyl Glycol-Propyl Glycol (SYSTANE OP) Place 1 drop into both eyes 2 (two) times daily. Use 1-2 Drops in both eyes  . rosuvastatin (CRESTOR) 5 MG tablet TAKE ONE-HALF (1/2) TABLET DAILY  . SALINE NASAL SPRAY NA Place 1 spray into both nostrils 2 (two) times daily as needed (congestion).   . vitamin E 400 UNIT capsule Take 400 Units by mouth daily.  . [DISCONTINUED] budesonide-formoterol (SYMBICORT) 160-4.5 MCG/ACT inhaler Inhale 2 puffs into the lungs 2 (two) times daily. Or as directed (Patient not taking: Reported on 02/02/2018)  . [DISCONTINUED] doxycycline (VIBRA-TABS) 100 MG tablet Take 1 tablet (100 mg total) by mouth 2 (two) times daily. (Patient not taking: Reported on 02/02/2018)  . [DISCONTINUED] mupirocin cream (BACTROBAN) 2 % Apply 1 application topically 2 (two) times daily. (Patient not taking: Reported on 02/02/2018)  . [DISCONTINUED] PROAIR HFA 108 (90 Base) MCG/ACT inhaler Inhale 2 puffs into the lungs every 6 (six) hours as needed for wheezing or shortness of breath (Patient not taking: Reported on 02/02/2018)  . [DISCONTINUED] traMADol (ULTRAM) 50 MG tablet Take 1 tablet (50 mg total) by mouth every  6 (six) hours as needed. (Patient not taking: Reported on 02/02/2018)   No facility-administered encounter medications on file as of 02/02/2018.     EXAM:  BP 120/70 (BP Location: Left Arm, Patient Position: Sitting, Cuff Size: Normal)   Pulse 97   Temp 97.7 F (36.5 C) (Oral)   Wt 119 lb (54 kg)   BMI 21.77 kg/m   Body mass index is 21.77 kg/m.  Physical Exam:  Vital signs reviewed QQV:[id number] is a well-developed well-nourished alert cooperative   who appears stated age in no acute distress.  HEENT: normocephalic atraumatic , NECK: supple without masses, thyromegaly or bruits. CHEST/PULM:  Clear to auscultation and percussion breath sounds equal no wheeze , rales or rhonchi. CV: PMI is nondisplaced, S1 S2 no gallops, murmurs,RR rubs. No jvd  ABDOMEN: Bowel sounds normal nontender  No guard or rebound, no hepato splenomegal no CVA tenderness.   Extremtities:  No clubbing cyanosis or edema,  DJD  NEURO:  Oriented x3, grossly nl  SKIN: No acute rashes normal turgor, color, no bruising or petechiae. Left le  healing  PSYCH: Oriented, good eye contact, no obvious depression anxiety, cognition and judgment appear normal. LN: no cervical al adenopathy No noted deficits in memory, attention, and speech.   Lab Results  Component Value Date   WBC 5.8 08/18/2017   HGB 13.5 08/18/2017   HCT 41.3 08/18/2017   PLT 201.0 08/18/2017   GLUCOSE 93 08/18/2017   CHOL 159 08/18/2017   TRIG 142.0 08/18/2017   HDL 62.80 08/18/2017   LDLCALC 68 08/18/2017   ALT 13 08/18/2017   AST 17 08/18/2017   NA 141 08/18/2017   K 4.6 08/18/2017   CL 106 08/18/2017   CREATININE 0.66 08/18/2017   BUN 17 08/18/2017   CO2 33 (H) 08/18/2017   TSH 0.58 09/15/2017   INR 1.02 01/12/2016   HGBA1C 5.1 10/14/2016   Wt Readings from Last 3 Encounters:  02/02/18 119 lb (54 kg)  12/01/17 117 lb 9.6 oz (53.3 kg)  09/30/17 117 lb (53.1 kg)     ASSESSMENT AND PLAN:  Discussed the following assessment and  plan:  Hypertension, isolated systolic - Plan: Basic metabolic panel, TSH, CBC with Differential/Platelet  Medication management - Plan: Basic metabolic panel, TSH, CBC with Differential/Platelet  Advanced age  Hyperglycemia - Plan: Basic metabolic panel, TSH, CBC with Differential/Platelet  Chronic diastolic CHF (congestive heart failure), NYHA class 2 (HCC)  Hypothyroidism, unspecified type - Plan: Basic metabolic panel, TSH, CBC with Differential/Platelet  Irregular heart beat ? -   at visit dr Carmelina Peal but not today  due for yearly card check get appt ? if  monitioringindicated   no sx obvious  Osteoporosis, unspecified osteoporosis type, unspecified pathological fracture presence - continue on prolia Seems to be doing well  ? Significance of  irreg  HR on exam per dr Carmelina Peal  . If AFib or not.   Get appt with   Cards to decide if  Monitoring  Appropriate.  Exam shows RR today  Patient Care Team: Earma Nicolaou, [name], MD as PCP - General Newton Pigg, MD (Obstetrics and Gynecology) Paralee Cancel, MD (Orthopedic Surgery) Jacelyn Pi, MD as Attending Physician (Endocrinology) Lafayette Dragon, MD (Inactive) as Consulting Physician (Gastroenterology) Everitt Amber, MD as Consulting Physician (Ophthalmology) Stanford Breed Denice Bors, MD as Consulting Physician (Cardiology) [name], MD as Consulting Physician (Cardiothoracic Surgery) thn    Patient Instructions  Advise  appt with cardiology about the  ? Of irregular heart beat .   I will  Flag dr Stanford Breed .  Also .  Will notify you  of labs when available.  If lab ok then  ROV in 6 months or as needed.  Get back to water   Walking when possible.      [name]. Oliana Gowens M.D.

## 2018-02-02 ENCOUNTER — Encounter: Payer: Self-pay | Admitting: Internal Medicine

## 2018-02-02 ENCOUNTER — Ambulatory Visit (INDEPENDENT_AMBULATORY_CARE_PROVIDER_SITE_OTHER): Payer: Medicare Other | Admitting: Internal Medicine

## 2018-02-02 VITALS — BP 120/70 | HR 97 | Temp 97.7°F | Wt 119.0 lb

## 2018-02-02 DIAGNOSIS — R739 Hyperglycemia, unspecified: Secondary | ICD-10-CM | POA: Diagnosis not present

## 2018-02-02 DIAGNOSIS — I10 Essential (primary) hypertension: Secondary | ICD-10-CM | POA: Diagnosis not present

## 2018-02-02 DIAGNOSIS — I499 Cardiac arrhythmia, unspecified: Secondary | ICD-10-CM

## 2018-02-02 DIAGNOSIS — R54 Age-related physical debility: Secondary | ICD-10-CM

## 2018-02-02 DIAGNOSIS — M810 Age-related osteoporosis without current pathological fracture: Secondary | ICD-10-CM

## 2018-02-02 DIAGNOSIS — I5032 Chronic diastolic (congestive) heart failure: Secondary | ICD-10-CM

## 2018-02-02 DIAGNOSIS — Z79899 Other long term (current) drug therapy: Secondary | ICD-10-CM | POA: Diagnosis not present

## 2018-02-02 DIAGNOSIS — E039 Hypothyroidism, unspecified: Secondary | ICD-10-CM | POA: Diagnosis not present

## 2018-02-02 LAB — CBC WITH DIFFERENTIAL/PLATELET
Basophils Absolute: 0.1 10*3/uL (ref 0.0–0.1)
Basophils Relative: 1.1 % (ref 0.0–3.0)
EOS ABS: 0.1 10*3/uL (ref 0.0–0.7)
EOS PCT: 2.5 % (ref 0.0–5.0)
HEMATOCRIT: 40.7 % (ref 36.0–46.0)
HEMOGLOBIN: 13.7 g/dL (ref 12.0–15.0)
LYMPHS PCT: 27.4 % (ref 12.0–46.0)
Lymphs Abs: 1.5 10*3/uL (ref 0.7–4.0)
MCHC: 33.5 g/dL (ref 30.0–36.0)
MCV: 94.3 fl (ref 78.0–100.0)
Monocytes Absolute: 0.6 10*3/uL (ref 0.1–1.0)
Monocytes Relative: 10.9 % (ref 3.0–12.0)
NEUTROS ABS: 3.1 10*3/uL (ref 1.4–7.7)
Neutrophils Relative %: 58.1 % (ref 43.0–77.0)
PLATELETS: 214 10*3/uL (ref 150.0–400.0)
RBC: 4.32 Mil/uL (ref 3.87–5.11)
RDW: 14.4 % (ref 11.5–15.5)
WBC: 5.4 10*3/uL (ref 4.0–10.5)

## 2018-02-02 LAB — BASIC METABOLIC PANEL
BUN: 15 mg/dL (ref 6–23)
CALCIUM: 9.1 mg/dL (ref 8.4–10.5)
CHLORIDE: 106 meq/L (ref 96–112)
CO2: 30 meq/L (ref 19–32)
CREATININE: 0.68 mg/dL (ref 0.40–1.20)
GFR: 87.02 mL/min (ref >60.00)
GLUCOSE: 83 mg/dL (ref 70–99)
Potassium: 4.4 mEq/L (ref 3.5–5.1)
SODIUM: 141 meq/L (ref 135–145)

## 2018-02-02 LAB — TSH: TSH: 0.43 u[IU]/mL (ref 0.35–4.50)

## 2018-02-02 NOTE — Patient Instructions (Addendum)
Advise  appt with cardiology about the  ? Of irregular heart beat .   I will  Flag dr Stanford Breed .  Also .  Will notify you  of labs when available.  If lab ok then  ROV in 6 months or as needed.  Get back to water   Walking when possible.

## 2018-02-05 ENCOUNTER — Telehealth: Payer: Self-pay | Admitting: Internal Medicine

## 2018-02-05 NOTE — Telephone Encounter (Signed)
Pt was called informed that the [employer] summary of benefits was received and the the "estimated patient out-of-pocket" expense is $0. Pt verbalized understanding.

## 2018-02-16 ENCOUNTER — Encounter: Payer: Self-pay | Admitting: Physician Assistant

## 2018-02-16 ENCOUNTER — Ambulatory Visit (INDEPENDENT_AMBULATORY_CARE_PROVIDER_SITE_OTHER): Payer: Medicare Other | Admitting: Physician Assistant

## 2018-02-16 VITALS — BP 126/68 | HR 100 | Ht 62.0 in | Wt 118.0 lb

## 2018-02-16 DIAGNOSIS — R000 Tachycardia, unspecified: Secondary | ICD-10-CM | POA: Diagnosis not present

## 2018-02-16 DIAGNOSIS — I2510 Atherosclerotic heart disease of native coronary artery without angina pectoris: Secondary | ICD-10-CM

## 2018-02-16 DIAGNOSIS — Z9889 Other specified postprocedural states: Secondary | ICD-10-CM

## 2018-02-16 DIAGNOSIS — I5032 Chronic diastolic (congestive) heart failure: Secondary | ICD-10-CM

## 2018-02-16 DIAGNOSIS — I2583 Coronary atherosclerosis due to lipid rich plaque: Secondary | ICD-10-CM | POA: Diagnosis not present

## 2018-02-16 DIAGNOSIS — I499 Cardiac arrhythmia, unspecified: Secondary | ICD-10-CM

## 2018-02-16 DIAGNOSIS — I10 Essential (primary) hypertension: Secondary | ICD-10-CM | POA: Diagnosis not present

## 2018-02-16 NOTE — Patient Instructions (Addendum)
Your physician recommends that you continue on your current medications as directed. Please refer to the Current Medication list given to you today. Your physician has recommended that you wear an event monitor. Event monitors are medical devices that record the heart's electrical activity. Doctors most often [place] these monitors to diagnose arrhythmias. Arrhythmias are problems with the speed or rhythm of the heartbeat. The monitor is a small, portable device. You can wear one while you do your normal daily activities. This is usually used to diagnose what is causing palpitations/syncope (passing out).  30 day   Your physician recommends that you schedule a follow-up appointment in:  [place]

## 2018-02-16 NOTE — Progress Notes (Signed)
Cardiology Office Note    Date:  02/17/2018   ID:  Amy Fischer, DOB 22-Apr-1929, MRN [id number]  PCP:  Burnis Medin, MD  Cardiologist:  Dr. Stanford Breed  Chief Complaint  Patient presents with  . Follow-up    seen for Dr. Stanford Breed. Irregular heart beat  . Shortness of Breath    History of Present Illness:  Amy Fischer is a 84 y.o. female with PMH of carotid artery disease, HLD, subclavian steal syndrome, recurrent vertigo, CAD, MVR and hypothyroidism.  She had a history of mitral valve repair.  She was admitted in October 2016 with congestive heart failure.  TEE showed normal LV function, flail posterior leaflet with severe mitral regurgitation.  Mild to moderate tricuspid regurgitation.  Cardiac catheterization in October 2016 showed 30% LAD, 60% D1, and 30% RCA with EF 60%.  She subsequently had mitral valve repair.  Preoperative carotid Doppler showed 1-39% disease.  She did have postop atrial fibrillation after the surgery however was not placed on systemic anticoagulation due to lack of recurrence.  Last echocardiogram obtained on 05/06/2016 showed EF 55-60%, grade 1 DD, stable mitral valve repair was trace MR, mildly elevated pulmonary pressure.  Patient was most recently seen by her allergist Dr. Neldon Mc on 01/26/2018, she was told by Dr. Neldon Mc that her heart rate was irregular at the time.  She also mentions the same thing to Dr. Regis Bill on 02/02/2018.  It was recommended for her to follow-up with cardiology service.  Her most recent TSH was normal.  Patient herself denies any awareness of palpitation.  Her heart rate is in the upper 90s, EKG continued to show chronic first-degree AV block on no AV nodal blocking agent.  I recommended a 30-day event monitor to further evaluate.  She can follow-up with Dr. Stanford Breed in 3 months.  Otherwise she has no chest pain, no lower extremity edema, orthopnea or PND.  She has some baseline dyspnea with exertion, she used to be able to walk 2 miles  several years ago, she has not been exercising that much recently other than walking in the pool.  She still walk about 45 minutes daily in the community pool.  She has some cough today, this started after she choked on something last night.  However on exam, she does not have any wheezing, rhonchi or crackle.  She will continue to monitor the cough.   Past Medical History:  Diagnosis Date  . Allergy   . Anemia   . Asymptomatic carotid artery stenosis    R ICA 40% stenosis on CTA 12/2011.  . Bladder polyps   . Cataract    BILATERAL-REMOVED  . CHF (congestive heart failure) ([place])   . Diverticulosis   . Elevated blood pressure 03/25/2011   Bp readings borderline today has hx of elvation  In office and ok at home   Not checke recently   She gfeels was elevated from anxiety    . Episodic recurrent vertigo    MRI Head 2003  . Esophageal spasm   . GERD (gastroesophageal reflux disease)   . Hepatic hemangioma   . History of hepatitis    unknown type  . Hyperlipidemia   . Hyperplastic colon polyp   . Hypothyroidism   . Intestinal metaplasia of gastric mucosa   . Osteoarthritis   . Osteoporosis   . Patellar fracture 07/13/2012  . Scapular fracture 03/31/2012   small from direct blow with trip    . Subclavian steal syndrome  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; CTA 2013 revealed open graft    Past Surgical History:  Procedure Laterality Date  . APPENDECTOMY    . CARDIAC CATHETERIZATION N/A 10/04/2015   Procedure: Right/Left Heart Cath and Coronary Angiography;  Surgeon: Belva Crome, MD;  Location: [facility];  Service: Cardiovascular;  Laterality: N/A;  . CAROTID-SUBCLAVIAN BYPASS GRAFT Left 1999   for [place] steal syndrome  . CHOLECYSTECTOMY    . COLONOSCOPY    . CYSTECTOMY Left    hand  . ELBOW SURGERY Left   . KNEE SURGERY Left 2014  . MITRAL VALVE REPAIR N/A 10/10/2015   Procedure: MITRAL VALVE REPAIR (MVR) WITH SIZE 28 CARPENTIER-EDWARDS PHYSIO II ANNULOPLASTY  RING;  Surgeon: [name], MD;  Location: [facility];  Service: Open Heart Surgery;  Laterality: N/A;  . ROTATOR CUFF REPAIR Left   . TEAR DUCT PROBING  07/2014  . TEE WITHOUT CARDIOVERSION N/A 10/03/2015   Procedure: TRANSESOPHAGEAL ECHOCARDIOGRAM (TEE);  Surgeon: Larey Dresser, MD;  Location: [facility];  Service: Cardiovascular;  Laterality: N/A;  . TEE WITHOUT CARDIOVERSION N/A 10/10/2015   Procedure: TRANSESOPHAGEAL ECHOCARDIOGRAM (TEE);  Surgeon: [name], MD;  Location: [facility];  Service: Open Heart Surgery;  Laterality: N/A;  . TONSILLECTOMY    . TUBAL LIGATION      Current Medications: Outpatient Medications Prior to Visit  Medication Sig Dispense Refill  . acetaminophen (TYLENOL) 325 MG tablet Take 2 tablets (650 mg total) by mouth every 6 (six) hours as needed for mild pain.    [name] aspirin EC 81 MG EC tablet Take 1 tablet (81 mg total) by mouth daily. (Patient taking differently: Take 81 mg by mouth at bedtime. )    . cholecalciferol (VITAMIN D) 1000 units tablet Take 1,000 Units by mouth daily.    . DiphenhydrAMINE HCl (BENADRYL PO) Take by mouth.    . docusate sodium (COLACE) 100 MG capsule Take 100 mg by mouth daily as needed for mild constipation.    [name] ezetimibe (ZETIA) 10 MG tablet TAKE 1 TABLET DAILY 90 tablet 1  . fish oil-omega-3 fatty acids 1000 MG capsule Take 1 g by mouth daily.     . folic acid (FOLVITE) 1 MG tablet TAKE 1 TABLET DAILY 90 tablet 0  . glucosamine-chondroitin 500-400 MG tablet Take 1 tablet by mouth daily.     [name] ipratropium (ATROVENT) 0.06 % nasal spray Place 1-2 sprays into both nostrils 2 (two) times daily as needed for rhinitis. 15 mL 5  . levothyroxine (SYNTHROID, LEVOTHROID) 100 MCG tablet TAKE 1 TABLET EVERY MORNING ON AN EMPTY STOMACH 90 tablet 0  . Melatonin 3 MG TABS Take 3 mg by mouth at bedtime.     . MULTIPLE VITAMIN PO Take 1 tablet by mouth daily. 50+ Senior Vitamin Daily    . omeprazole (PRILOSEC) 40 MG capsule TAKE 1  CAPSULE DAILY (Patient taking differently: TAKE 1 CAPSULE DAILY before dinner) 90 capsule 1  . Polyethyl Glycol-Propyl Glycol (SYSTANE OP) Place 1 drop into both eyes 2 (two) times daily. Use 1-2 Drops in both eyes    . rosuvastatin (CRESTOR) 5 MG tablet TAKE ONE-HALF (1/2) TABLET DAILY 90 tablet 1  . SALINE NASAL SPRAY NA Place 1 spray into both nostrils 2 (two) times daily as needed (congestion).     . triamcinolone (NASACORT ALLERGY 24HR) 55 MCG/ACT AERO nasal inhaler Place 1 spray into the nose daily.    . vitamin E 400  UNIT capsule Take 400 Units by mouth daily.     No facility-administered medications prior to visit.      Allergies:   Codeine; Risedronate sodium; Statins; Tape; and Amoxicillin-pot clavulanate   Social History   Socioeconomic History  . Marital status: Widowed    Spouse name: None  . Number of children: 4  . Years of education: None  . Highest education level: None  Social Needs  . Financial resource strain: None  . Food insecurity - worry: None  . Food insecurity - inability: None  . Transportation needs - medical: None  . Transportation needs - non-medical: None  Occupational History  . Occupation: retired  Tobacco Use  . Smoking status: Former Smoker    Packs/day: 0.75    Years: 20.00    Pack years: 15.00    Last attempt to quit: 01/05/1991    Years since quitting: 27.1  . Smokeless tobacco: Never Used  Substance and Sexual Activity  . Alcohol use: Yes    Alcohol/week: 2.4 oz    Types: 4 Glasses of wine per week    Comment: socially  . Drug use: No  . Sexual activity: None  Other Topics Concern  . None  Social History Narrative   Widowed   HH of 1    No pets   Former smoker   Exercises regularly     Family History:  The patient's family history includes Heart disease in her father; Hypertension in her mother; Stroke in her mother.   ROS:   Please see the history of present illness.    ROS All other systems reviewed and are  negative.   PHYSICAL EXAM:   VS:  BP 126/68 (BP Location: Right Arm, Patient Position: Sitting, Cuff Size: Normal)   Pulse 100   Ht 5\' 2"  (1.575 m)   Wt 118 lb (53.5 kg)   BMI 21.58 kg/m    GEN: Well nourished, well developed, in no acute distress  HEENT: normal  Neck: no JVD, carotid bruits, or masses Cardiac: RRR; no murmurs, rubs, or gallops,no edema  Respiratory:  clear to auscultation bilaterally, normal work of breathing GI: soft, nontender, nondistended, + BS MS: no deformity or atrophy  Skin: warm and dry, no rash Neuro:  Alert and Oriented x 3, Strength and sensation are intact Psych: euthymic mood, full affect  Wt Readings from Last 3 Encounters:  02/16/18 118 lb (53.5 kg)  02/02/18 119 lb (54 kg)  12/01/17 117 lb 9.6 oz (53.3 kg)      Studies/Labs Reviewed:   EKG:  EKG is ordered today.  The ekg ordered today demonstrates sinus rhythm was first-degree AV block.  Recent Labs: 08/18/2017: ALT 13 02/02/2018: BUN 15; Creatinine, Ser 0.68; Hemoglobin 13.7; Platelets 214.0; Potassium 4.4; Sodium 141; TSH 0.43   Lipid Panel    Component Value Date/Time   CHOL 159 08/18/2017 1148   TRIG 142.0 08/18/2017 1148   HDL 62.80 08/18/2017 1148   CHOLHDL 3 08/18/2017 1148   VLDL 28.4 08/18/2017 1148   LDLCALC 68 08/18/2017 1148    Additional studies/ records that were reviewed today include:   Cath 10/04/2015 Conclusion   1. Prox LAD to Mid LAD lesion, 30% stenosed. 2. Ost 1st Diag to 1st Diag lesion, 60% stenosed. 3. Prox RCA to Mid RCA lesion, 30% stenosed.    Moderate to severe mitral regurgitation by ventriculography.  Preserved left ventricular systolic function with ejection fraction of 60%. No regional wall motion abnormality is noted.  Widely patent coronary arteries with minimal luminal irregularities noted in the LAD, diagonal, and mid right coronary.  Mild pulmonary hypertension. Significant V wave spiking up to 35  mmHg.   Recommendations:   Surgical therapy per [facility], Dr. Roxan Hockey.  Sheath removal by manual compression of the right femoral area.  Monitor for evidence of hematoma.      Carotid US 10/05/2015  Summary: Right: mild soft plaque origin and throughout ICA. Left: moderate soft plaque throughout CCA, origin and proximal ICA. Bilatearl: 1-39% ICA stenosis.     Echo 05/06/2016 LV EF: 55% -   60% Study Conclusions  - Left ventricle: The cavity size was normal. Wall thickness was   normal. Systolic function was normal. The estimated ejection   fraction was in the range of 55% to 60%. Wall motion was normal;   there were no regional wall motion abnormalities. Doppler   parameters are consistent with abnormal left ventricular   relaxation (grade 1 diastolic dysfunction). Doppler parameters   are consistent with high ventricular filling pressure. - Mitral valve: Prior procedures included surgical repair. Valve   area by continuity equation (using LVOT flow): 1.86 cm^2. - Left atrium: The atrium was mildly dilated. - Pulmonary arteries: Systolic pressure was increased.  Impressions:  - Normal LV systolic function; grade 1 diastolic dysfunction with   elevated LV filling pressure; mild LAE; s/p MV repair wth trace   MR; mild TR; mildly elevated pulmonary pressure.   ASSESSMENT:    1. Irregular heart beat   2. Hypertension, isolated systolic   3. Chronic diastolic CHF (congestive heart failure), NYHA class 2 ([place])   4. Coronary artery disease due to lipid rich plaque   5. Tachycardia   6. Status post mitral valve annuloplasty      PLAN:  In order of problems listed above:  4. Irregular heartbeat: Patient was recently seen by her allergist who noticed she was having irregular heartbeat.  She mentioned this to her PCP who asked her to be evaluated by cardiology service.  She herself does not have any cardiac awareness.  I will have her wear a event monitor to  assess for any occurrence of atrial fibrillation.  She is currently on no AV nodal blocking agent with baseline first-degree AV block on EKG.  5. CAD: Last cardiac catheterization in 2016 that showed nonobstructive disease, she denies any recent exertional chest discomfort or shortness of breath.  6. Mitral valve annuloplasty: Last echocardiogram in 2017 showed stable mitral valve repair.  7. Hypertension: Blood pressure well controlled    Medication Adjustments/Labs and Tests Ordered: Current medicines are reviewed at length with the patient today.  Concerns regarding medicines are outlined above.  Medication changes, Labs and Tests ordered today are listed in the Patient Instructions below. Patient Instructions  Your physician recommends that you continue on your current medications as directed. Please refer to the Current Medication list given to you today. Your physician has recommended that you wear an event monitor. Event monitors are medical devices that record the heart's electrical activity. Doctors most often [place] these monitors to diagnose arrhythmias. Arrhythmias are problems with the speed or rhythm of the heartbeat. The monitor is a small, portable device. You can wear one while you do your normal daily activities. This is usually used to diagnose what is causing palpitations/syncope (passing out).  30 day   Your physician recommends that you schedule a follow-up appointment in:  [place]    Signed, Almyra Deforest,  PA  02/17/2018 11:54 PM    [facility] [address], [place], [place]  [address] Phone: [phone]; Fax: [phone]

## 2018-02-17 ENCOUNTER — Encounter: Payer: Self-pay | Admitting: Physician Assistant

## 2018-02-19 ENCOUNTER — Ambulatory Visit (INDEPENDENT_AMBULATORY_CARE_PROVIDER_SITE_OTHER): Payer: Medicare Other | Admitting: Family Medicine

## 2018-02-19 ENCOUNTER — Encounter: Payer: Self-pay | Admitting: Family Medicine

## 2018-02-19 VITALS — BP 128/62 | HR 99 | Temp 98.0°F | Wt 115.6 lb

## 2018-02-19 DIAGNOSIS — J101 Influenza due to other identified influenza virus with other respiratory manifestations: Secondary | ICD-10-CM | POA: Diagnosis not present

## 2018-02-19 DIAGNOSIS — R6889 Other general symptoms and signs: Secondary | ICD-10-CM

## 2018-02-19 LAB — POC INFLUENZA A&B (BINAX/QUICKVUE)
INFLUENZA A, POC: POSITIVE — AB
INFLUENZA B, POC: NEGATIVE

## 2018-02-19 MED ORDER — OSELTAMIVIR PHOSPHATE 75 MG PO CAPS: 75.0000 mg | ORAL_CAPSULE | Freq: Two times a day (BID) | ORAL | 0 refills | Status: DC

## 2018-02-19 NOTE — Progress Notes (Signed)
   Subjective:    Patient ID: [name], female    DOB: December 29, 1929, 84 y.o.   MRN: [id number]  HPI Here for 4 days of low grade fevers, a dry cough, body aches, and diarrhea. Drinking fluids and taking Mucinex.    Review of Systems  Constitutional: Positive for fever. Negative for chills and diaphoresis.  HENT: Negative.   Eyes: Negative.   Respiratory: Positive for cough. Negative for shortness of breath.   Cardiovascular: Negative.   Gastrointestinal: Positive for diarrhea. Negative for abdominal distention, abdominal pain, constipation, nausea and vomiting.       Objective:   Physical Exam  Constitutional:  Ill appearing   HENT:  Right Ear: External ear normal.  Left Ear: External ear normal.  Nose: Nose normal.  Mouth/Throat: Oropharynx is clear and moist.  Eyes: Conjunctivae are normal.  Neck: No thyromegaly present.  Pulmonary/Chest: Effort normal and breath sounds normal. No respiratory distress. She has no wheezes. She has no rales.  Lymphadenopathy:    She has no cervical adenopathy.          Assessment & Plan:  Influenza A. Treat with Tamiflu. Recheck prn. Alysia Penna, MD

## 2018-02-26 NOTE — Progress Notes (Signed)
Chief Complaint  Patient presents with  . Cough    Seen 2/22 and dx with Flu A. Pt notes having a low grade temp 99.9 x 1 week ago. Pt notes worsening cough, productive at times with yellow/grey mucus. SOB is increased. Upper chest feels congested. Denies fever.     HPI: Amy Fischer 84 y.o. come in for   Seen feb 22 for pos flu  And given tamiflu   N  Without problem . o improvement   But took the tamiflu .   no blood  In phelgm.  Since then  Out of breath all the time.   anc coughing  Bad and felt wind pipe  Closed up.  Doesn't seem  t o be in lungs.  But mid upper . No sinus pain  Just feels tired    Had old inhaler used a few years ago       ROS: See pertinent positives and negatives per HPI.  Past Medical History:  Diagnosis Date  . Allergy   . Anemia   . Asymptomatic carotid artery stenosis    R ICA 40% stenosis on CTA 12/2011.  . Bladder polyps   . Cataract    BILATERAL-REMOVED  . CHF (congestive heart failure) ([place])   . Diverticulosis   . Elevated blood pressure 03/25/2011   Bp readings borderline today has hx of elvation  In office and ok at home   Not checke recently   She gfeels was elevated from anxiety    . Episodic recurrent vertigo    MRI Head 2003  . Esophageal spasm   . GERD (gastroesophageal reflux disease)   . Hepatic hemangioma   . History of hepatitis    unknown type  . Hyperlipidemia   . Hyperplastic colon polyp   . Hypothyroidism   . Intestinal metaplasia of gastric mucosa   . Osteoarthritis   . Osteoporosis   . Patellar fracture 07/13/2012  . Scapular fracture 03/31/2012   small from direct blow with trip    . Subclavian steal syndrome    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; CTA 2013 revealed open graft    Family History  Problem Relation Age of Onset  . Hypertension Mother   . Stroke Mother   . Heart disease Father   . Colon cancer Neg Hx     Social History   Socioeconomic History  . Marital status: Widowed    Spouse name:  None  . Number of children: 4  . Years of education: None  . Highest education level: None  Social Needs  . Financial resource strain: None  . Food insecurity - worry: None  . Food insecurity - inability: None  . Transportation needs - medical: None  . Transportation needs - non-medical: None  Occupational History  . Occupation: retired  Tobacco Use  . Smoking status: Former Smoker    Packs/day: 0.75    Years: 20.00    Pack years: 15.00    Last attempt to quit: 01/05/1991    Years since quitting: 27.1  . Smokeless tobacco: Never Used  Substance and Sexual Activity  . Alcohol use: Yes    Alcohol/week: 2.4 oz    Types: 4 Glasses of wine per week    Comment: socially  . Drug use: No  . Sexual activity: None  Other Topics Concern  . None  Social History Narrative   Widowed   HH of 1    No pets  Former smoker   Exercises regularly    Outpatient Medications Prior to Visit  Medication Sig Dispense Refill  . acetaminophen (TYLENOL) 325 MG tablet Take 2 tablets (650 mg total) by mouth every 6 (six) hours as needed for mild pain.    [name] aspirin EC 81 MG EC tablet Take 1 tablet (81 mg total) by mouth daily. (Patient taking differently: Take 81 mg by mouth at bedtime. )    . cholecalciferol (VITAMIN D) 1000 units tablet Take 1,000 Units by mouth daily.    . DiphenhydrAMINE HCl (BENADRYL PO) Take by mouth.    . ezetimibe (ZETIA) 10 MG tablet TAKE 1 TABLET DAILY 90 tablet 1  . fish oil-omega-3 fatty acids 1000 MG capsule Take 1 g by mouth daily.     . folic acid (FOLVITE) 1 MG tablet TAKE 1 TABLET DAILY 90 tablet 0  . glucosamine-chondroitin 500-400 MG tablet Take 1 tablet by mouth daily.     [name] ipratropium (ATROVENT) 0.06 % nasal spray Place 1-2 sprays into both nostrils 2 (two) times daily as needed for rhinitis. 15 mL 5  . levothyroxine (SYNTHROID, LEVOTHROID) 100 MCG tablet TAKE 1 TABLET EVERY MORNING ON AN EMPTY STOMACH 90 tablet 0  . Melatonin 3 MG TABS Take 3 mg by mouth at  bedtime.     . MULTIPLE VITAMIN PO Take 1 tablet by mouth daily. 50+ Senior Vitamin Daily    . omeprazole (PRILOSEC) 40 MG capsule TAKE 1 CAPSULE DAILY (Patient taking differently: TAKE 1 CAPSULE DAILY before dinner) 90 capsule 1  . Polyethyl Glycol-Propyl Glycol (SYSTANE OP) Place 1 drop into both eyes 2 (two) times daily. Use 1-2 Drops in both eyes    . rosuvastatin (CRESTOR) 5 MG tablet TAKE ONE-HALF (1/2) TABLET DAILY 90 tablet 1  . SALINE NASAL SPRAY NA Place 1 spray into both nostrils 2 (two) times daily as needed (congestion).     . triamcinolone (NASACORT ALLERGY 24HR) 55 MCG/ACT AERO nasal inhaler Place 1 spray into the nose daily.    . vitamin E 400 UNIT capsule Take 400 Units by mouth daily.    [name] docusate sodium (COLACE) 100 MG capsule Take 100 mg by mouth daily as needed for mild constipation.    [name] oseltamivir (TAMIFLU) 75 MG capsule Take 1 capsule (75 mg total) by mouth 2 (two) times daily. (Patient not taking: Reported on 03/01/2018) 10 capsule 0   No facility-administered medications prior to visit.      EXAM:  BP 122/62 (BP Location: Right Arm, Patient Position: Sitting, Cuff Size: Normal)   Pulse 93   Temp 97.7 F (36.5 C) (Oral)   Wt 116 lb 14.4 oz (53 kg)   SpO2 99%   BMI 21.38 kg/m   Body mass index is 21.38 kg/m. WDWN in NAD  quiet respirations; mildly congested  somewhat hoarse. Non toxic . But tired   Younger than sated age  27: Normocephalic ;atraumatic , Eyes;  PERRL, EOMs  Full, lids and conjunctiva clear,,Ears: no deformities, canals nl, TM landmarks normal, Nose: no deformity or discharge but congested;face minimally tender Mouth : OP clear without lesion or edema .? pnd  Neck: Supple without adenopathy or masses or bruits Chest:  Good BS   throughoues but  Has a few musicle exp sounds    No rales or rhonchi CV:  S1-S2 no gallops or murmurs peripheral perfusion is normal Skin :nl perfusion and no acute rashes    Lab Results  Component Value Date  WBC 5.4 02/02/2018   HGB 13.7 02/02/2018   HCT 40.7 02/02/2018   PLT 214.0 02/02/2018   GLUCOSE 83 02/02/2018   CHOL 159 08/18/2017   TRIG 142.0 08/18/2017   HDL 62.80 08/18/2017   LDLCALC 68 08/18/2017   ALT 13 08/18/2017   AST 17 08/18/2017   NA 141 02/02/2018   K 4.4 02/02/2018   CL 106 02/02/2018   CREATININE 0.68 02/02/2018   BUN 15 02/02/2018   CO2 30 02/02/2018   TSH 0.43 02/02/2018   INR 1.02 01/12/2016   HGBA1C 5.1 10/14/2016   BP Readings from Last 3 Encounters:  03/01/18 122/62  02/19/18 128/62  02/16/18 126/68    ASSESSMENT AND PLAN:  Discussed the following assessment and plan:  Dyspnea, unspecified type - Plan: DG Chest 2 View, ipratropium-albuterol (DUONEB) 0.5-2.5 (3) MG/3ML nebulizer solution 3 mL  Influenza A - sp tamiflu rx   persistent cough  and sob - Plan: DG Chest 2 View  Advanced age No evidence of acute chf  Other process  X ray confirm no pna air space disease  Suspect post   Infectious  triggered  Bronchitis    No obv sinusitis    Albuterol and pred   X 3 days trial  -Patient advised to return or notify health care team  if  new concerns arise.  Patient Instructions  Your chest exam is good and better after  Breathing treatment    GET chest x ray at [facility] office building   X ray  to make sure no missing pneumonia     If ok we may add prednisone  And inhaler   sometime antibiotic  Added  .          [name]    [name]. Choya Tornow M.D.

## 2018-03-01 ENCOUNTER — Other Ambulatory Visit: Payer: Self-pay | Admitting: Internal Medicine

## 2018-03-01 ENCOUNTER — Ambulatory Visit (INDEPENDENT_AMBULATORY_CARE_PROVIDER_SITE_OTHER)
Admission: RE | Admit: 2018-03-01 | Discharge: 2018-03-01 | Disposition: A | Discharge status: Home / Self Care | Payer: Medicare Other | Source: Ambulatory Visit | Attending: Internal Medicine | Admitting: Internal Medicine

## 2018-03-01 ENCOUNTER — Encounter: Payer: Self-pay | Admitting: Internal Medicine

## 2018-03-01 ENCOUNTER — Ambulatory Visit (INDEPENDENT_AMBULATORY_CARE_PROVIDER_SITE_OTHER): Payer: Medicare Other | Admitting: Internal Medicine

## 2018-03-01 VITALS — BP 122/62 | HR 93 | Temp 97.7°F | Wt 116.9 lb

## 2018-03-01 DIAGNOSIS — R0600 Dyspnea, unspecified: Secondary | ICD-10-CM

## 2018-03-01 DIAGNOSIS — I2583 Coronary atherosclerosis due to lipid rich plaque: Secondary | ICD-10-CM

## 2018-03-01 DIAGNOSIS — J101 Influenza due to other identified influenza virus with other respiratory manifestations: Secondary | ICD-10-CM

## 2018-03-01 DIAGNOSIS — R05 Cough: Secondary | ICD-10-CM | POA: Diagnosis not present

## 2018-03-01 DIAGNOSIS — I2510 Atherosclerotic heart disease of native coronary artery without angina pectoris: Secondary | ICD-10-CM

## 2018-03-01 DIAGNOSIS — R54 Age-related physical debility: Secondary | ICD-10-CM | POA: Diagnosis not present

## 2018-03-01 DIAGNOSIS — R0602 Shortness of breath: Secondary | ICD-10-CM | POA: Diagnosis not present

## 2018-03-01 MED ORDER — IPRATROPIUM-ALBUTEROL 0.5-2.5 (3) MG/3ML IN SOLN
3.0000 mL | Freq: Once | RESPIRATORY_TRACT | Status: AC
  Administered 2018-03-01: 3 mL via RESPIRATORY_TRACT

## 2018-03-01 MED ORDER — PREDNISONE 20 MG PO TABS: 20.0000 mg | ORAL_TABLET | Freq: Two times a day (BID) | ORAL | 0 refills | Status: DC

## 2018-03-01 MED ORDER — ALBUTEROL SULFATE HFA 108 (90 BASE) MCG/ACT IN AERS: 2.0000 | INHALATION_SPRAY | Freq: Four times a day (QID) | RESPIRATORY_TRACT | 0 refills | Status: DC | PRN

## 2018-03-01 NOTE — Patient Instructions (Addendum)
Your chest exam is good and better after  Breathing treatment    GET chest x ray at [facility] office building   X ray  to make sure no missing pneumonia     If ok we may add prednisone  And inhaler   sometime antibiotic  Added  .          [name]

## 2018-03-02 ENCOUNTER — Ambulatory Visit (INDEPENDENT_AMBULATORY_CARE_PROVIDER_SITE_OTHER): Payer: Medicare Other | Admitting: Allergy and Immunology

## 2018-03-02 ENCOUNTER — Encounter: Payer: Self-pay | Admitting: Allergy and Immunology

## 2018-03-02 VITALS — BP 110/64 | HR 88 | Resp 20

## 2018-03-02 DIAGNOSIS — G933 Postviral fatigue syndrome: Secondary | ICD-10-CM

## 2018-03-02 DIAGNOSIS — I2583 Coronary atherosclerosis due to lipid rich plaque: Secondary | ICD-10-CM | POA: Diagnosis not present

## 2018-03-02 DIAGNOSIS — I2510 Atherosclerotic heart disease of native coronary artery without angina pectoris: Secondary | ICD-10-CM | POA: Diagnosis not present

## 2018-03-02 DIAGNOSIS — J438 Other emphysema: Secondary | ICD-10-CM | POA: Diagnosis not present

## 2018-03-02 DIAGNOSIS — K219 Gastro-esophageal reflux disease without esophagitis: Secondary | ICD-10-CM

## 2018-03-02 DIAGNOSIS — J3089 Other allergic rhinitis: Secondary | ICD-10-CM

## 2018-03-02 DIAGNOSIS — G9331 Postviral fatigue syndrome: Secondary | ICD-10-CM

## 2018-03-02 MED ORDER — BUDESONIDE 0.5 MG/2ML IN SUSP: 0.5000 mg | Freq: Two times a day (BID) | RESPIRATORY_TRACT | 3 refills | Status: DC

## 2018-03-02 MED ORDER — LORATADINE 10 MG PO TABS: 10.0000 mg | ORAL_TABLET | Freq: Every day | ORAL | 5 refills | Status: DC

## 2018-03-02 MED ORDER — IPRATROPIUM-ALBUTEROL 0.5-2.5 (3) MG/3ML IN SOLN: 3.0000 mL | RESPIRATORY_TRACT | 3 refills | Status: DC | PRN

## 2018-03-02 MED ORDER — ARFORMOTEROL TARTRATE 15 MCG/2ML IN NEBU: 15.0000 ug | INHALATION_SOLUTION | Freq: Two times a day (BID) | RESPIRATORY_TRACT | 3 refills | Status: DC

## 2018-03-02 MED ORDER — IPRATROPIUM-ALBUTEROL 0.5-2.5 (3) MG/3ML IN SOLN: 3.0000 mL | Freq: Four times a day (QID) | RESPIRATORY_TRACT | Status: DC

## 2018-03-02 NOTE — Progress Notes (Signed)
Follow-up Note  Referring Provider: Burnis Medin, MD Primary Provider: Burnis Medin, MD Date of Office Visit: 03/02/2018  Subjective:   Amy Fischer (DOB: August 02, 1929) is a 84 y.o. female who returns to the [facility] on 03/02/2018 in re-evaluation of the following:  HPI: Amy Fischer returns to this clinic in reevaluation of respiratory tract issues addressed during her initial evaluation of 18 January 2018.  She was here today to assess her response to treatment directed against rhinitis and reflux.  Unfortunately, 2 weeks ago she started to develop influenza symptoms and she had delay of her Tamiflu until day 4 of her influenza activity and she became quite sick.  Even today she still has a cough although it is somewhat better from her peak.  She is out of breath and she visited with Dr. Regis Bill today and was treated with prednisone and instructed to start her inhalers.  Apparently she had a chest x-ray which was okay without any evidence of pneumonia.  She does not make any ugly sputum production and although her nose is congested she does not make any ugly nasal discharge and she has not had any recurrent fever.  Allergies as of 03/02/2018      Reactions   Codeine Nausea And Vomiting   Risedronate Sodium    Upset stomach. Could take Fosamax.   Statins    Muscles hurt, can take low dose   Tape Other (See Comments)   Blisters, Please use "paper" tape   Amoxicillin-pot Clavulanate Diarrhea   Not allergic  2014, Pt can take z pack only Not allergic  2014, Pt can take z pack only      Medication List      albuterol 108 (90 Base) MCG/ACT inhaler Commonly known as:  PROVENTIL HFA;VENTOLIN HFA Inhale 2 puffs into the lungs every 6 (six) hours as needed for wheezing or shortness of breath.   aspirin 81 MG EC tablet Take 1 tablet (81 mg total) by mouth daily.   BENADRYL PO Take by mouth.   cholecalciferol 1000 units tablet Commonly known as:  VITAMIN D Take  1,000 Units by mouth daily.   ezetimibe 10 MG tablet Commonly known as:  ZETIA TAKE 1 TABLET DAILY   fish oil-omega-3 fatty acids 1000 MG capsule Take 1 g by mouth daily.   folic acid 1 MG tablet Commonly known as:  FOLVITE TAKE 1 TABLET DAILY   glucosamine-chondroitin 500-400 MG tablet Take 1 tablet by mouth daily.   HAIR/SKIN/NAILS PO Take 1 tablet by mouth daily.   ipratropium 0.06 % nasal spray Commonly known as:  ATROVENT Place 1-2 sprays into both nostrils 2 (two) times daily as needed for rhinitis.   levothyroxine 100 MCG tablet Commonly known as:  SYNTHROID, LEVOTHROID TAKE 1 TABLET EVERY MORNING ON AN EMPTY STOMACH   Melatonin 3 MG Tabs Take 3 mg by mouth at bedtime.   MULTIPLE VITAMIN PO Take 1 tablet by mouth daily. 50+ Senior Vitamin Daily   NASACORT ALLERGY 24HR 55 MCG/ACT Aero nasal inhaler Generic drug:  triamcinolone Place 1 spray into the nose daily.   omeprazole 40 MG capsule Commonly known as:  PRILOSEC TAKE 1 CAPSULE DAILY   rosuvastatin 5 MG tablet Commonly known as:  CRESTOR TAKE ONE-HALF (1/2) TABLET DAILY   SALINE NASAL SPRAY NA Place 1 spray into both nostrils 2 (two) times daily as needed (congestion).   SYSTANE OP Place 1 drop into both eyes 2 (two) times daily.  Use 1-2 Drops in both eyes   vitamin E 400 UNIT capsule Take 400 Units by mouth daily.       Past Medical History:  Diagnosis Date  . Allergy   . Anemia   . Asymptomatic carotid artery stenosis    R ICA 40% stenosis on CTA 12/2011.  . Bladder polyps   . Cataract    BILATERAL-REMOVED  . CHF (congestive heart failure) ([place])   . Diverticulosis   . Elevated blood pressure 03/25/2011   Bp readings borderline today has hx of elvation  In office and ok at home   Not checke recently   She gfeels was elevated from anxiety    . Episodic recurrent vertigo    MRI Head 2003  . Esophageal spasm   . GERD (gastroesophageal reflux disease)   . Hepatic hemangioma   . History  of hepatitis    unknown type  . Hyperlipidemia   . Hyperplastic colon polyp   . Hypothyroidism   . Intestinal metaplasia of gastric mucosa   . Osteoarthritis   . Osteoporosis   . Patellar fracture 07/13/2012  . Scapular fracture 03/31/2012   small from direct blow with trip    . Subclavian steal syndrome    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; CTA 2013 revealed open graft    Past Surgical History:  Procedure Laterality Date  . APPENDECTOMY    . CARDIAC CATHETERIZATION N/A 10/04/2015   Procedure: Right/Left Heart Cath and Coronary Angiography;  Surgeon: Belva Crome, MD;  Location: [facility];  Service: Cardiovascular;  Laterality: N/A;  . CAROTID-SUBCLAVIAN BYPASS GRAFT Left 1999   for [place] steal syndrome  . CHOLECYSTECTOMY    . COLONOSCOPY    . CYSTECTOMY Left    hand  . ELBOW SURGERY Left   . KNEE SURGERY Left 2014  . MITRAL VALVE REPAIR N/A 10/10/2015   Procedure: MITRAL VALVE REPAIR (MVR) WITH SIZE 28 CARPENTIER-EDWARDS PHYSIO II ANNULOPLASTY RING;  Surgeon: [name], MD;  Location: [facility];  Service: Open Heart Surgery;  Laterality: N/A;  . ROTATOR CUFF REPAIR Left   . TEAR DUCT PROBING  07/2014  . TEE WITHOUT CARDIOVERSION N/A 10/03/2015   Procedure: TRANSESOPHAGEAL ECHOCARDIOGRAM (TEE);  Surgeon: Larey Dresser, MD;  Location: [facility];  Service: Cardiovascular;  Laterality: N/A;  . TEE WITHOUT CARDIOVERSION N/A 10/10/2015   Procedure: TRANSESOPHAGEAL ECHOCARDIOGRAM (TEE);  Surgeon: [name], MD;  Location: [facility];  Service: Open Heart Surgery;  Laterality: N/A;  . TONSILLECTOMY    . TUBAL LIGATION      Review of systems negative except as noted in HPI / PMHx or noted below:  Review of Systems  Constitutional: Negative.   HENT: Negative.   Eyes: Negative.   Respiratory: Negative.   Cardiovascular: Negative.   Gastrointestinal: Negative.   Genitourinary: Negative.   Musculoskeletal: Negative.   Skin: Negative.     Neurological: Negative.   Endo/Heme/Allergies: Negative.   Psychiatric/Behavioral: Negative.      Objective:   Vitals:   03/02/18 1544  BP: 110/64  Pulse: 88  Resp: 20  SpO2: 97%          Physical Exam  Constitutional: She is well-developed, well-nourished, and in no distress.  Coughing  HENT:  Head: Normocephalic.  Right Ear: Tympanic membrane, external ear and ear canal normal.  Left Ear: Tympanic membrane, external ear and ear canal normal.  Nose: Mucosal edema (Erythematous) present. No rhinorrhea.  Mouth/Throat: Uvula is  midline, oropharynx is clear and moist and mucous membranes are normal. No oropharyngeal exudate.  Eyes: Conjunctivae are normal.  Neck: Trachea normal. No tracheal tenderness present. No tracheal deviation present. No thyromegaly present.  Cardiovascular: Normal rate, regular rhythm, S1 normal, S2 normal and normal heart sounds.  No murmur heard. Pulmonary/Chest: Breath sounds normal. No stridor. No respiratory distress. She has no wheezes. She has no rales.  Musculoskeletal: She exhibits no edema.  Lymphadenopathy:       Head (right side): No tonsillar adenopathy present.       Head (left side): No tonsillar adenopathy present.    She has no cervical adenopathy.  Neurological: She is alert. Gait normal.  Skin: No rash noted. She is not diaphoretic. No erythema. Nails show no clubbing.  Psychiatric: Mood and affect normal.    Diagnostics:    Oxygen saturation was on room air at rest was 97%  Assessment and Plan:   1. Other allergic rhinitis   2. Gastroesophageal reflux disease, esophagitis presence not specified   3. Post-influenza syndrome   4. Other emphysema ([place])     1. Every day use the following medications:   A. Nebulized brovona twice a day  B. Nebulized budesonide 0.5mg  twice a day  C. Nasal saline twice a day  D. mucinex DM 1-2 tablets twice a day  E. Loratadine 10mg  1 tablet twice a day   2.  Continue to Treat and  prevent reflux:   A.  Omeprazole 40 mg tablet 1 time per day  B.  Consolidate caffeine use  3. If needed: Duoneb nebulization every 6 hours (dose in clinic)  4. Return to clinic in 3 weeks or earlier if problem  Before we can assess any type of benefit that [facility] may have received from her plan established during her initial evaluation we need to get her over this post influenza syndrome and given her previous history of emphysema we will treat her with the respiratory medications noted above while she also continues to address the issue with her reflux.  I will see her back in this clinic in 3 weeks or earlier if there is a problem.  Allena Katz, MD Allergy / Immunology [place]

## 2018-03-02 NOTE — Patient Instructions (Addendum)
  1. Every day use the following medications:   A. Nebulized brovona twice a day  B. Nebulized budesonide 0.5mg  twice a day  C. Nasal saline twice a day  D. mucinex DM 1-2 tablets twice a day  E. Loratadine 10mg  1 tablet twice a day   2.  Continue to Treat and prevent reflux:   A.  Omeprazole 40 mg tablet 1 time per day  B.  Consolidate caffeine use  3. If needed: Duoneb nebulization every 6 hours (dose in clinic)  4. Return to clinic in 3 weeks or earlier if problem

## 2018-03-03 ENCOUNTER — Encounter: Payer: Self-pay | Admitting: Allergy and Immunology

## 2018-03-09 ENCOUNTER — Telehealth: Payer: Self-pay | Admitting: Allergy and Immunology

## 2018-03-09 NOTE — Telephone Encounter (Signed)
Patient saw Dr. Neldon Mc on 03-02-18 and was prescribed some medicine. She picked all of them up from her local pharmacy and thought she was only supposed to take them for 3-4 weeks and then be done with them. She said [facility] called her and wanted to verify all these same medications to send to her. She wants to know if she will have to be on these medications for the rest of her life? She doesn't want [facility] to send her all this medicine if she does not need it.

## 2018-03-09 NOTE — Telephone Encounter (Signed)
Informed patient to hold ordering any medications until her follow up visit with Dr. Neldon Mc.

## 2018-03-11 ENCOUNTER — Other Ambulatory Visit: Payer: Self-pay | Admitting: Physician Assistant

## 2018-03-11 DIAGNOSIS — I441 Atrioventricular block, second degree: Secondary | ICD-10-CM

## 2018-03-11 DIAGNOSIS — I499 Cardiac arrhythmia, unspecified: Secondary | ICD-10-CM

## 2018-03-11 DIAGNOSIS — R000 Tachycardia, unspecified: Secondary | ICD-10-CM

## 2018-03-15 ENCOUNTER — Ambulatory Visit (INDEPENDENT_AMBULATORY_CARE_PROVIDER_SITE_OTHER): Payer: Medicare Other

## 2018-03-15 DIAGNOSIS — I441 Atrioventricular block, second degree: Secondary | ICD-10-CM

## 2018-03-15 DIAGNOSIS — R000 Tachycardia, unspecified: Secondary | ICD-10-CM | POA: Diagnosis not present

## 2018-03-15 DIAGNOSIS — I499 Cardiac arrhythmia, unspecified: Secondary | ICD-10-CM

## 2018-03-15 DIAGNOSIS — I440 Atrioventricular block, first degree: Secondary | ICD-10-CM | POA: Diagnosis not present

## 2018-03-21 ENCOUNTER — Other Ambulatory Visit: Payer: Self-pay | Admitting: Internal Medicine

## 2018-03-23 ENCOUNTER — Ambulatory Visit (INDEPENDENT_AMBULATORY_CARE_PROVIDER_SITE_OTHER): Payer: Medicare Other | Admitting: Allergy and Immunology

## 2018-03-23 ENCOUNTER — Encounter: Payer: Self-pay | Admitting: Allergy and Immunology

## 2018-03-23 VITALS — BP 110/60 | HR 88 | Resp 20

## 2018-03-23 DIAGNOSIS — I2583 Coronary atherosclerosis due to lipid rich plaque: Secondary | ICD-10-CM

## 2018-03-23 DIAGNOSIS — I2510 Atherosclerotic heart disease of native coronary artery without angina pectoris: Secondary | ICD-10-CM

## 2018-03-23 DIAGNOSIS — J438 Other emphysema: Secondary | ICD-10-CM | POA: Diagnosis not present

## 2018-03-23 DIAGNOSIS — J3089 Other allergic rhinitis: Secondary | ICD-10-CM | POA: Diagnosis not present

## 2018-03-23 DIAGNOSIS — K219 Gastro-esophageal reflux disease without esophagitis: Secondary | ICD-10-CM | POA: Diagnosis not present
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
## 2018-03-23 NOTE — Patient Instructions (Addendum)
  1. Discontinue the following:   A. Nebulized brovona    B. Nebulized budesonide      2.  Continue to Treat and prevent reflux:   A.  Omeprazole 40 mg tablet 1 time per day  B.  Consolidate caffeine use  3. Continue to treat inflammation:   A. OTC Nasacort 1 spray each nostril one time per day  3. If needed:    A. Duoneb nebulization every 6 hours    B. Loratadine 10 mg one time per day  C. Nasal ipratropium 0.06 - 1-2 sprays each nostril every 6 hours   4. Return to clinic in 12 weeks or earlier if problem

## 2018-03-24 ENCOUNTER — Encounter: Payer: Self-pay | Admitting: Cardiology

## 2018-03-24 ENCOUNTER — Telehealth: Payer: Self-pay | Admitting: Cardiology

## 2018-03-24 ENCOUNTER — Encounter: Payer: Self-pay | Admitting: Allergy and Immunology

## 2018-03-24 NOTE — Telephone Encounter (Signed)
Please arrange fuov with Isaac Laud Kirk Ruths

## 2018-03-24 NOTE — Telephone Encounter (Signed)
Patient returned call. She is aware MD has recommended a sooner appt. She is able to come in on 4/11 @ 330pm

## 2018-03-24 NOTE — Telephone Encounter (Signed)
New Message    [place] with [employer] is calling with a critical EKG.

## 2018-03-24 NOTE — Telephone Encounter (Signed)
  LMTCB in regard to appt per MD.  Patient has been scheduled for 4/11 @ 3:30pm since this was first available opening and MD said it is OK to come in on this day.

## 2018-03-24 NOTE — Telephone Encounter (Signed)
Spoke with [facility] @ [employer] concerning patient's event monitor. On day 10 of 30 of monitoring, patient had AF with RVR of HR 150bpm around 9:07am CT. 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 Monitor was ordered by [employer] PA for irregular heart beat, has baseline first degree AVB. Reviewed with DOD Dr. Percival Spanish who reviewed monitor as AF. Advised to route message to Dr. Stanford Breed to review and advise on anticoagulation options

## 2018-03-24 NOTE — Telephone Encounter (Signed)
Yes

## 2018-03-26 ENCOUNTER — Other Ambulatory Visit: Payer: Self-pay | Admitting: Internal Medicine

## 2018-04-08 ENCOUNTER — Ambulatory Visit (INDEPENDENT_AMBULATORY_CARE_PROVIDER_SITE_OTHER): Payer: Medicare Other | Admitting: Physician Assistant

## 2018-04-08 ENCOUNTER — Encounter: Payer: Self-pay | Admitting: Physician Assistant

## 2018-04-08 VITALS — BP 104/56 | HR 102 | Ht 62.0 in | Wt 118.8 lb

## 2018-04-08 DIAGNOSIS — E039 Hypothyroidism, unspecified: Secondary | ICD-10-CM

## 2018-04-08 DIAGNOSIS — E785 Hyperlipidemia, unspecified: Secondary | ICD-10-CM | POA: Diagnosis not present

## 2018-04-08 DIAGNOSIS — I6523 Occlusion and stenosis of bilateral carotid arteries: Secondary | ICD-10-CM | POA: Diagnosis not present

## 2018-04-08 DIAGNOSIS — I2510 Atherosclerotic heart disease of native coronary artery without angina pectoris: Secondary | ICD-10-CM

## 2018-04-08 DIAGNOSIS — I480 Paroxysmal atrial fibrillation: Secondary | ICD-10-CM | POA: Diagnosis not present

## 2018-04-08 DIAGNOSIS — Z9889 Other specified postprocedural states: Secondary | ICD-10-CM

## 2018-04-08 MED ORDER — APIXABAN 2.5 MG PO TABS: 2.5000 mg | ORAL_TABLET | Freq: Two times a day (BID) | ORAL | 3 refills | Status: DC

## 2018-04-08 MED ORDER — APIXABAN 2.5 MG PO TABS: 2.5000 mg | ORAL_TABLET | Freq: Two times a day (BID) | ORAL | 0 refills | Status: DC

## 2018-04-08 MED ORDER — AMIODARONE HCL 200 MG PO TABS: 200.0000 mg | ORAL_TABLET | Freq: Every day | ORAL | 3 refills | Status: DC

## 2018-04-08 MED ORDER — AMIODARONE HCL 200 MG PO TABS: 200.0000 mg | ORAL_TABLET | Freq: Every day | ORAL | 0 refills | Status: DC

## 2018-04-08 NOTE — Patient Instructions (Addendum)
Almyra Deforest, PA-c has recommended making the following medication changes: 1. STOP Aspirin 2. START Eliquis 2.5 mg - take 1 tablet by mouth twice daily 3. START Amiodarone 200 mg - take 1 tablet by daily  Your physician recommends that you return for lab work in 2 weeks.  Your physician has requested that you have an echocardiogram. Echocardiography is a painless test that uses sound waves to create images of your heart. It provides your doctor with information about the size and shape of your heart and how well your heart's chambers and valves are working. This procedure takes approximately one hour. There are no restrictions for this procedure. >>This will be performed at our [facility] location [place], [place] [address] [phone]  Elizebeth Koller the heart monitor.  Keep appointment with Dr Stanford Breed in May.
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
## 2018-04-09 ENCOUNTER — Encounter: Payer: Self-pay | Admitting: Physician Assistant

## 2018-04-12 NOTE — Addendum Note (Signed)
Addended by: Diana Eves on: 04/12/2018 01:50 PM   Modules accepted: Orders

## 2018-04-20 ENCOUNTER — Ambulatory Visit (HOSPITAL_COMMUNITY): Payer: Medicare Other | Attending: Cardiovascular Disease

## 2018-04-20 ENCOUNTER — Other Ambulatory Visit: Payer: Self-pay

## 2018-04-20 DIAGNOSIS — I361 Nonrheumatic tricuspid (valve) insufficiency: Secondary | ICD-10-CM | POA: Insufficient documentation

## 2018-04-20 DIAGNOSIS — I480 Paroxysmal atrial fibrillation: Secondary | ICD-10-CM | POA: Diagnosis not present

## 2018-04-21 NOTE — Progress Notes (Signed)
Normal pumping function of heart, stable repaired mitral valve

## 2018-04-23 DIAGNOSIS — I480 Paroxysmal atrial fibrillation: Secondary | ICD-10-CM | POA: Diagnosis not present

## 2018-04-24 LAB — CBC
HEMATOCRIT: 42.5 % (ref 34.0–46.6)
Hemoglobin: 13.7 g/dL (ref 11.1–15.9)
MCH: 30 pg (ref 26.6–33.0)
MCHC: 32.2 g/dL (ref 31.5–35.7)
MCV: 93 fL (ref 79–97)
Platelets: 241 10*3/uL (ref 150–379)
RBC: 4.56 x10E6/uL (ref 3.77–5.28)
RDW: 14.5 % (ref 12.3–15.4)
WBC: 6.5 10*3/uL (ref 3.4–10.8)

## 2018-05-11 ENCOUNTER — Ambulatory Visit (INDEPENDENT_AMBULATORY_CARE_PROVIDER_SITE_OTHER): Payer: Medicare Other | Admitting: Internal Medicine

## 2018-05-11 ENCOUNTER — Ambulatory Visit (INDEPENDENT_AMBULATORY_CARE_PROVIDER_SITE_OTHER)
Admission: RE | Admit: 2018-05-11 | Discharge: 2018-05-11 | Disposition: A | Discharge status: Home / Self Care | Payer: Medicare Other | Source: Ambulatory Visit | Attending: Internal Medicine | Admitting: Internal Medicine

## 2018-05-11 ENCOUNTER — Encounter: Payer: Self-pay | Admitting: Internal Medicine

## 2018-05-11 VITALS — BP 146/80 | HR 75 | Temp 97.6°F | Wt 116.7 lb

## 2018-05-11 DIAGNOSIS — Z7901 Long term (current) use of anticoagulants: Secondary | ICD-10-CM

## 2018-05-11 DIAGNOSIS — S299XXA Unspecified injury of thorax, initial encounter: Secondary | ICD-10-CM

## 2018-05-11 DIAGNOSIS — Z9181 History of falling: Secondary | ICD-10-CM

## 2018-05-11 DIAGNOSIS — R079 Chest pain, unspecified: Secondary | ICD-10-CM | POA: Diagnosis not present

## 2018-05-11 DIAGNOSIS — I6523 Occlusion and stenosis of bilateral carotid arteries: Secondary | ICD-10-CM

## 2018-05-11 DIAGNOSIS — M810 Age-related osteoporosis without current pathological fracture: Secondary | ICD-10-CM | POA: Diagnosis not present

## 2018-05-11 NOTE — Progress Notes (Signed)
Chief Complaint  Patient presents with  . Chest Pain    Pt fell 4am this morning - got up to use the restroom, lost her balance and hit her back on edge of night stand. Pt states that she is hurting in her ribs on the right side further to the front. Denies any other injuries.     HPI: Amy Fischer 84 y.o. come in for   sda   For fall   Lose balance    Early am in dark  Bedroom when went ot br . No hitting head no loc just fell again coffee table right flank area   .  Hurts more right lateral rib area  No bruising and no rx  Family make her come  ( she is no anticoagulants)    On prolia for  Osteoporosis no recent fracture   No sob but pain on exhalations  ROS: See pertinent positives and negatives per HPI. No fever cough no syncope palpitations  Neuro sx   Past Medical History:  Diagnosis Date  . Allergy   . Anemia   . Asymptomatic carotid artery stenosis    R ICA 40% stenosis on CTA 12/2011.  . Bladder polyps   . Cataract    BILATERAL-REMOVED  . CHF (congestive heart failure) ([place])   . Diverticulosis   . Elevated blood pressure 03/25/2011   Bp readings borderline today has hx of elvation  In office and ok at home   Not checke recently   She gfeels was elevated from anxiety    . Episodic recurrent vertigo    MRI Head 2003  . Esophageal spasm   . GERD (gastroesophageal reflux disease)   . Hepatic hemangioma   . History of hepatitis    unknown type  . Hyperlipidemia   . Hyperplastic colon polyp   . Hypothyroidism   . Intestinal metaplasia of gastric mucosa   . Osteoarthritis   . Osteoporosis   . Patellar fracture 07/13/2012  . Scapular fracture 03/31/2012   small from direct blow with trip    . Subclavian steal syndrome    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; CTA 2013 revealed open graft    Family History  Problem Relation Age of Onset  . Hypertension Mother   . Stroke Mother   . Heart disease Father   . Colon cancer Neg Hx     Social History    Socioeconomic History  . Marital status: Widowed    Spouse name: Not on file  . Number of children: 4  . Years of education: Not on file  . Highest education level: Not on file  Occupational History  . Occupation: retired  Scientific laboratory technician  . Financial resource strain: Not on file  . Food insecurity:    Worry: Not on file    Inability: Not on file  . Transportation needs:    Medical: Not on file    Non-medical: Not on file  Tobacco Use  . Smoking status: Former Smoker    Packs/day: 0.75    Years: 20.00    Pack years: 15.00    Last attempt to quit: 01/05/1991    Years since quitting: 27.3  . Smokeless tobacco: Never Used  Substance and Sexual Activity  . Alcohol use: Yes    Alcohol/week: 2.4 oz    Types: 4 Glasses of wine per week    Comment: socially  . Drug use: No  . Sexual activity: Not on file  Lifestyle  . Physical activity:    Days per week: Not on file    Minutes per session: Not on file  . Stress: Not on file  Relationships  . Social connections:    Talks on phone: Not on file    Gets together: Not on file    Attends religious service: Not on file    Active member of club or organization: Not on file    Attends meetings of clubs or organizations: Not on file    Relationship status: Not on file  Other Topics Concern  . Not on file  Social History Narrative   Widowed   HH of 1    No pets   Former smoker   Exercises regularly    Outpatient Medications Prior to Visit  Medication Sig Dispense Refill  . albuterol (PROVENTIL HFA;VENTOLIN HFA) 108 (90 Base) MCG/ACT inhaler Inhale 2 puffs into the lungs every 6 (six) hours as needed for wheezing or shortness of breath. 1 Inhaler 0  . amiodarone (PACERONE) 200 MG tablet Take 1 tablet (200 mg total) by mouth daily. 90 tablet 3  . apixaban (ELIQUIS) 2.5 MG TABS tablet Take 1 tablet (2.5 mg total) by mouth 2 (two) times daily. 180 tablet 3  . Biotin w/ Vitamins C & E (HAIR/SKIN/NAILS PO) Take 1 tablet by mouth  daily.    . cholecalciferol (VITAMIN D) 1000 units tablet Take 1,000 Units by mouth daily.    . DiphenhydrAMINE HCl (BENADRYL PO) Take by mouth.    . ezetimibe (ZETIA) 10 MG tablet TAKE 1 TABLET DAILY 90 tablet 1  . fish oil-omega-3 fatty acids 1000 MG capsule Take 1 g by mouth daily.     . folic acid (FOLVITE) 1 MG tablet TAKE 1 TABLET DAILY 90 tablet 1  . glucosamine-chondroitin 500-400 MG tablet Take 1 tablet by mouth daily.     [name] ipratropium (ATROVENT) 0.06 % nasal spray Place 1-2 sprays into both nostrils 2 (two) times daily as needed for rhinitis. 15 mL 5  . levothyroxine (SYNTHROID, LEVOTHROID) 100 MCG tablet TAKE 1 TABLET EVERY MORNING ON AN EMPTY STOMACH 90 tablet 1  . loratadine (CLARITIN) 10 MG tablet Take 1 tablet (10 mg total) by mouth daily. Take one tablet twice daily 60 tablet 5  . Melatonin 3 MG TABS Take 3 mg by mouth at bedtime.     . MULTIPLE VITAMIN PO Take 1 tablet by mouth daily. 50+ Senior Vitamin Daily    . omeprazole (PRILOSEC) 40 MG capsule TAKE 1 CAPSULE DAILY (Patient taking differently: TAKE 1 CAPSULE DAILY before dinner) 90 capsule 1  . Polyethyl Glycol-Propyl Glycol (SYSTANE OP) Place 1 drop into both eyes 2 (two) times daily. Use 1-2 Drops in both eyes    . rosuvastatin (CRESTOR) 5 MG tablet TAKE ONE-HALF (1/2) TABLET DAILY 90 tablet 1  . SALINE NASAL SPRAY NA Place 1 spray into both nostrils 2 (two) times daily as needed (congestion).     . triamcinolone (NASACORT ALLERGY 24HR) 55 MCG/ACT AERO nasal inhaler Place 1 spray into the nose daily.    . vitamin E 400 UNIT capsule Take 400 Units by mouth daily.     No facility-administered medications prior to visit.      EXAM:  BP (!) 146/80 (BP Location: Left Arm, Patient Position: Sitting, Cuff Size: Normal)   Pulse 75   Temp 97.6 F (36.4 C) (Oral)   Wt 116 lb 11.2 oz (52.9 kg)   BMI 21.34 kg/m  Body mass index is 21.34 kg/m.  GENERAL: vitals reviewed and listed above, alert, oriented, appears well  hydrated and in no acute distress HEENT: atraumatic, conjunctiva  clear, no obvious abnormalities on inspection of external nose and ears   NECK: no obvious masses on inspection palpation  LUNGS: clear to auscultation bilaterally, no wheezes, rales or rhonchi,  CV: HRRR, no clubbing cyanosis slight ankle   peripheral edema nl cap refill   CW no bruises  Tender right lateral t 10 -12 rib area no step off  abd no g r tender upper  Right rib area  MS: moves all extremities   djd changes  No acute findings  Unassisted ambulation cautious  PSYCH: pleasant and cooperative, no obvious depression or anxiety Lab Results  Component Value Date   WBC 6.5 04/23/2018   HGB 13.7 04/23/2018   HCT 42.5 04/23/2018   PLT 241 04/23/2018   GLUCOSE 83 02/02/2018   CHOL 159 08/18/2017   TRIG 142.0 08/18/2017   HDL 62.80 08/18/2017   LDLCALC 68 08/18/2017   ALT 13 08/18/2017   AST 17 08/18/2017   NA 141 02/02/2018   K 4.4 02/02/2018   CL 106 02/02/2018   CREATININE 0.68 02/02/2018   BUN 15 02/02/2018   CO2 30 02/02/2018   TSH 0.43 02/02/2018   INR 1.02 01/12/2016   HGBA1C 5.1 10/14/2016   BP Readings from Last 3 Encounters:  05/11/18 (!) 146/80  04/08/18 (!) 104/56  03/23/18 110/60    ASSESSMENT AND PLAN:  Discussed the following assessment and plan:  Injury of chest wall, initial encounter - Plan: DG Ribs Unilateral Right, DG Chest 2 View  Hx of fall - Plan: DG Ribs Unilateral Right, DG Chest 2 View  Osteoporosis, unspecified osteoporosis type, unspecified pathological fracture presence - on prolia  - Plan: DG Ribs Unilateral Right, DG Chest 2 View  Age-related osteoporosis without current pathological fracture - Plan: DG Ribs Unilateral Right, DG Chest 2 View  Anticoagulant long-term use - Plan: DG Ribs Unilateral Right, DG Chest 2 View  -Patient advised to return or notify health care team  if  new concerns arise. Total visit 52mins > 50% spent counseling and coordinating care as  indicated in above note and in instructions to patient .  Fall prevention consider using walker every time  Using br at night  Etc     Patient Instructions  Get x ray   Local care   Deep breathing and splinting    Expectant management.  And fu  Fall prevention    [name]. Starletta Houchin M.D.

## 2018-05-11 NOTE — Patient Instructions (Addendum)
Get x ray   Local care   Deep breathing and splinting    Expectant management.  And fu  Fall prevention

## 2018-05-26 NOTE — Progress Notes (Signed)
HPI:  Follow-up mitral valve repair. Patient was in the hospital in October 2016 with congestive heart failure. Transesophageal echocardiogram showed normal LV function. There was a flail posterior leaflet with severe mitral regurgitation. There was mild to moderate tricuspid regurgitation. Cardiac catheterization October 2016 showed a 30% LAD, 60% first diagonal and 30% right coronary artery. Ejection fraction 60%. Patient subsequent had mitral valve repair. Note preoperative carotid Dopplers showed 1-39% stenosis. Note the patient had atrial fibrillation after surgery.  Follow-up monitor March 2019 showed sinus rhythm with PVCs and paroxysmal atrial flutter/fibrillation.  Last echocardiogram April 2019 showed normal LV function, moderate left ventricular hypertrophy, prior mitral valve repair and mild tricuspid regurgitation.  Patient placed on amiodarone and apixaban.  Since last seen, she has some dyspnea on exertion but no orthopnea, PND, pedal edema, chest pain or syncope.  She fell recently.  Current Outpatient Medications  Medication Sig Dispense Refill  . albuterol (PROVENTIL HFA;VENTOLIN HFA) 108 (90 Base) MCG/ACT inhaler Inhale 2 puffs into the lungs every 6 (six) hours as needed for wheezing or shortness of breath. 1 Inhaler 0  . amiodarone (PACERONE) 200 MG tablet Take 1 tablet (200 mg total) by mouth daily. 90 tablet 3  . apixaban (ELIQUIS) 2.5 MG TABS tablet Take 1 tablet (2.5 mg total) by mouth 2 (two) times daily. 180 tablet 3  . Biotin w/ Vitamins C & E (HAIR/SKIN/NAILS PO) Take 1 tablet by mouth daily.    . cholecalciferol (VITAMIN D) 1000 units tablet Take 1,000 Units by mouth daily.    . DiphenhydrAMINE HCl (BENADRYL PO) Take by mouth.    . ezetimibe (ZETIA) 10 MG tablet TAKE 1 TABLET DAILY 90 tablet 1  . fish oil-omega-3 fatty acids 1000 MG capsule Take 1 g by mouth daily.     . folic acid (FOLVITE) 1 MG tablet TAKE 1 TABLET DAILY 90 tablet 1  . glucosamine-chondroitin  500-400 MG tablet Take 1 tablet by mouth daily.     [name] ipratropium (ATROVENT) 0.06 % nasal spray Place 1-2 sprays into both nostrils 2 (two) times daily as needed for rhinitis. 15 mL 5  . levothyroxine (SYNTHROID, LEVOTHROID) 100 MCG tablet TAKE 1 TABLET EVERY MORNING ON AN EMPTY STOMACH 90 tablet 1  . loratadine (CLARITIN) 10 MG tablet Take 1 tablet (10 mg total) by mouth daily. Take one tablet twice daily 60 tablet 5  . Melatonin 3 MG TABS Take 3 mg by mouth at bedtime.     . MULTIPLE VITAMIN PO Take 1 tablet by mouth daily. 50+ Senior Vitamin Daily    . omeprazole (PRILOSEC) 40 MG capsule TAKE 1 CAPSULE DAILY (Patient taking differently: TAKE 1 CAPSULE DAILY before dinner) 90 capsule 1  . Polyethyl Glycol-Propyl Glycol (SYSTANE OP) Place 1 drop into both eyes 2 (two) times daily. Use 1-2 Drops in both eyes    . rosuvastatin (CRESTOR) 5 MG tablet TAKE ONE-HALF (1/2) TABLET DAILY 90 tablet 1  . SALINE NASAL SPRAY NA Place 1 spray into both nostrils 2 (two) times daily as needed (congestion).     . triamcinolone (NASACORT ALLERGY 24HR) 55 MCG/ACT AERO nasal inhaler Place 1 spray into the nose daily.    . vitamin E 400 UNIT capsule Take 400 Units by mouth daily.     No current facility-administered medications for this visit.      Past Medical History:  Diagnosis Date  . Allergy   . Anemia   . Asymptomatic carotid artery stenosis  R ICA 40% stenosis on CTA 12/2011.  . Bladder polyps   . Cataract    BILATERAL-REMOVED  . CHF (congestive heart failure) ([place])   . Diverticulosis   . Elevated blood pressure 03/25/2011   Bp readings borderline today has hx of elvation  In office and ok at home   Not checke recently   She gfeels was elevated from anxiety    . Episodic recurrent vertigo    MRI Head 2003  . Esophageal spasm   . GERD (gastroesophageal reflux disease)   . Hepatic hemangioma   . History of hepatitis    unknown type  . Hyperlipidemia   . Hyperplastic colon polyp   .  Hypothyroidism   . Intestinal metaplasia of gastric mucosa   . Osteoarthritis   . Osteoporosis   . Patellar fracture 07/13/2012  . Scapular fracture 03/31/2012   small from direct blow with trip    . Subclavian steal syndrome    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; CTA 2013 revealed open graft    Past Surgical History:  Procedure Laterality Date  . APPENDECTOMY    . CARDIAC CATHETERIZATION N/A 10/04/2015   Procedure: Right/Left Heart Cath and Coronary Angiography;  Surgeon: Belva Crome, MD;  Location: [facility];  Service: Cardiovascular;  Laterality: N/A;  . CAROTID-SUBCLAVIAN BYPASS GRAFT Left 1999   for [place] steal syndrome  . CHOLECYSTECTOMY    . COLONOSCOPY    . CYSTECTOMY Left    hand  . ELBOW SURGERY Left   . KNEE SURGERY Left 2014  . MITRAL VALVE REPAIR N/A 10/10/2015   Procedure: MITRAL VALVE REPAIR (MVR) WITH SIZE 28 CARPENTIER-EDWARDS PHYSIO II ANNULOPLASTY RING;  Surgeon: [name], MD;  Location: [facility];  Service: Open Heart Surgery;  Laterality: N/A;  . ROTATOR CUFF REPAIR Left   . TEAR DUCT PROBING  07/2014  . TEE WITHOUT CARDIOVERSION N/A 10/03/2015   Procedure: TRANSESOPHAGEAL ECHOCARDIOGRAM (TEE);  Surgeon: Larey Dresser, MD;  Location: [facility];  Service: Cardiovascular;  Laterality: N/A;  . TEE WITHOUT CARDIOVERSION N/A 10/10/2015   Procedure: TRANSESOPHAGEAL ECHOCARDIOGRAM (TEE);  Surgeon: [name], MD;  Location: [facility];  Service: Open Heart Surgery;  Laterality: N/A;  . TONSILLECTOMY    . TUBAL LIGATION      Social History   Socioeconomic History  . Marital status: Widowed    Spouse name: Not on file  . Number of children: 4  . Years of education: Not on file  . Highest education level: Not on file  Occupational History  . Occupation: retired  Scientific laboratory technician  . Financial resource strain: Not on file  . Food insecurity:    Worry: Not on file    Inability: Not on file  . Transportation needs:    Medical: Not  on file    Non-medical: Not on file  Tobacco Use  . Smoking status: Former Smoker    Packs/day: 0.75    Years: 20.00    Pack years: 15.00    Last attempt to quit: 01/05/1991    Years since quitting: 27.4  . Smokeless tobacco: Never Used  Substance and Sexual Activity  . Alcohol use: Yes    Alcohol/week: 2.4 oz    Types: 4 Glasses of wine per week    Comment: socially  . Drug use: No  . Sexual activity: Not on file  Lifestyle  . Physical activity:    Days per week: Not on file  Minutes per session: Not on file  . Stress: Not on file  Relationships  . Social connections:    Talks on phone: Not on file    Gets together: Not on file    Attends religious service: Not on file    Active member of club or organization: Not on file    Attends meetings of clubs or organizations: Not on file    Relationship status: Not on file  . Intimate partner violence:    Fear of current or ex partner: Not on file    Emotionally abused: Not on file    Physically abused: Not on file    Forced sexual activity: Not on file  Other Topics Concern  . Not on file  Social History Narrative   Widowed   HH of 1    No pets   Former smoker   Exercises regularly    Family History  Problem Relation Age of Onset  . Hypertension Mother   . Stroke Mother   . Heart disease Father   . Colon cancer Neg Hx     ROS: Some residual rib pain from recent fall but no fevers or chills, productive cough, hemoptysis, dysphasia, odynophagia, melena, hematochezia, dysuria, hematuria, rash, seizure activity, orthopnea, PND, pedal edema, claudication. Remaining systems are negative.  Physical Exam: Well-developed well-nourished in no acute distress.  Skin is warm and dry.  HEENT is normal.  Neck is supple.  Chest is clear to auscultation with normal expansion.  Cardiovascular exam is regular rate and rhythm.  Abdominal exam nontender or distended. No masses palpated. Extremities show no edema. neuro grossly  intact   A/P  1 coronary artery disease-patient doing well with no chest pain.  Continue medical therapy statin.  No aspirin given need for anticoagulation.   2 hyperlipidemia-continue statin.  3 hypertension-blood pressure is controlled.  Continue present medications.  4 paroxysmal atrial fibrillation-patient remains in sinus rhythm today.  Previous monitor showed paroxysmal atrial fibrillation.  Continue amiodarone 200 mg daily.  Continue apixaban.  When she returns in 6 months we will check TSH, liver functions, chest x-ray, hemoglobin and renal function.  She fell recently.  I had a long discussion with her today explaining the risks of bleeding with traumatic fall.  If she has more frequent episodes in the future we would need to consider discontinuing apixaban.  5 prior mitral valve repair-continue SBE prophylaxis.  Kirk Ruths, MD

## 2018-05-27 ENCOUNTER — Ambulatory Visit (INDEPENDENT_AMBULATORY_CARE_PROVIDER_SITE_OTHER): Payer: Medicare Other | Admitting: Cardiology

## 2018-05-27 ENCOUNTER — Encounter: Payer: Self-pay | Admitting: Cardiology

## 2018-05-27 VITALS — BP 136/68 | HR 80 | Ht 62.0 in | Wt 120.0 lb

## 2018-05-27 DIAGNOSIS — I6523 Occlusion and stenosis of bilateral carotid arteries: Secondary | ICD-10-CM | POA: Diagnosis not present

## 2018-05-27 DIAGNOSIS — I480 Paroxysmal atrial fibrillation: Secondary | ICD-10-CM

## 2018-05-27 DIAGNOSIS — E7800 Pure hypercholesterolemia, unspecified: Secondary | ICD-10-CM | POA: Diagnosis not present

## 2018-05-27 DIAGNOSIS — Z9889 Other specified postprocedural states: Secondary | ICD-10-CM | POA: Diagnosis not present

## 2018-05-27 DIAGNOSIS — I2510 Atherosclerotic heart disease of native coronary artery without angina pectoris: Secondary | ICD-10-CM

## 2018-05-27 DIAGNOSIS — I10 Essential (primary) hypertension: Secondary | ICD-10-CM | POA: Diagnosis not present

## 2018-05-27 NOTE — Patient Instructions (Signed)
Your physician wants you to follow-up in: 6 MONTHS WITH DR CRENSHAW You will receive a reminder letter in the mail two months in advance. If you don't receive a letter, please call our office to schedule the follow-up appointment.   If you need a refill on your cardiac medications before your next appointment, please call your pharmacy.  

## 2018-05-31 DIAGNOSIS — H5021 Vertical strabismus, right eye: Secondary | ICD-10-CM | POA: Diagnosis not present

## 2018-06-10 ENCOUNTER — Other Ambulatory Visit: Payer: Self-pay

## 2018-06-10 ENCOUNTER — Telehealth: Payer: Self-pay | Admitting: Internal Medicine

## 2018-06-10 NOTE — Telephone Encounter (Signed)
Pt requesting refill of Atrovent, which was previously filled by Dr. Neldon Mc. Left message for pt to contact Dr. Neldon Mc for refill and if medication can not be filled by that provider to contact the office back for refill.

## 2018-06-10 NOTE — Telephone Encounter (Signed)
Copied from [place] [id number]. Topic: Quick Communication - Rx Refill/Question >> Jun 10, 2018 11:36 AM Cleaster Corin, NT wrote: Medication: ipratropium (ATROVENT) 0.06 % nasal spray [[id number]]   Has the patient contacted their pharmacy? yes (Agent: If no, request that the patient contact the pharmacy for the refill.) (Agent: If yes, when and what did the pharmacy advise?)  Preferred Pharmacy (with phone number or street name): [facility] [place] [address] [place] [address] Phone: [phone] Fax: [phone]    Agent: Please be advised that RX refills may take up to 3 business days. We ask that you follow-up with your pharmacy.

## 2018-06-10 NOTE — Telephone Encounter (Signed)
LOV  05/11/18 Dr. Regis Bill Pt. Requesting Dr. Regis Bill refill nasal spray.

## 2018-06-10 NOTE — Telephone Encounter (Signed)
Copied from [place] [id number]. Topic: Quick Communication - Rx Refill/Question >> Jun 10, 2018 11:36 AM Cleaster Corin, NT wrote: Medication: ipratropium (ATROVENT) 0.06 % nasal spray [[id number]]   Has the patient contacted their pharmacy? yes (Agent: If no, request that the patient contact the pharmacy for the refill.) (Agent: If yes, when and what did the pharmacy advise?)  Preferred Pharmacy (with phone number or street name): [facility] [place] [address] [place] [address] Phone: [phone] Fax: [phone]    Agent: Please be advised that RX refills may take up to 3 business days. We ask that you follow-up with your pharmacy.

## 2018-06-11 NOTE — Telephone Encounter (Signed)
I called the pt and informed her of the message below.  Patient stated she thought Dr Regis Bill prescribed this for her, has enough to last until an appt next week with Dr Neldon Mc and will discuss this at that visit.

## 2018-06-11 NOTE — Telephone Encounter (Signed)
See other message there are 2

## 2018-06-11 NOTE — Telephone Encounter (Signed)
See prior message

## 2018-06-11 NOTE — Telephone Encounter (Signed)
Dr  Carmelina Peal  Has been refilling this medication  So refer this request to him however   however if running out can refill x 1  In my name

## 2018-06-15 ENCOUNTER — Encounter: Payer: Self-pay | Admitting: Allergy and Immunology

## 2018-06-15 ENCOUNTER — Ambulatory Visit (INDEPENDENT_AMBULATORY_CARE_PROVIDER_SITE_OTHER): Payer: Medicare Other | Admitting: Allergy and Immunology

## 2018-06-15 VITALS — BP 128/66 | HR 72 | Resp 16

## 2018-06-15 DIAGNOSIS — K219 Gastro-esophageal reflux disease without esophagitis: Secondary | ICD-10-CM

## 2018-06-15 DIAGNOSIS — J3089 Other allergic rhinitis: Secondary | ICD-10-CM | POA: Diagnosis not present

## 2018-06-15 DIAGNOSIS — J438 Other emphysema: Secondary | ICD-10-CM

## 2018-06-15 DIAGNOSIS — I6523 Occlusion and stenosis of bilateral carotid arteries: Secondary | ICD-10-CM | POA: Diagnosis not present

## 2018-06-15 MED ORDER — IPRATROPIUM BROMIDE 0.06 % NA SOLN: 1.0000 | Freq: Two times a day (BID) | NASAL | 3 refills | Status: DC | PRN

## 2018-06-15 NOTE — Progress Notes (Signed)
Follow-up Note  Referring Provider: Burnis Medin, MD Primary Provider: Burnis Medin, MD Date of Office Visit: 06/15/2018  Subjective:   Amy Fischer (DOB: 21-Jan-1929) is a 84 y.o. female who returns to the [facility] on 06/15/2018 in re-evaluation of the following:  HPI: Amy Fischer returns to this clinic in reevaluation of her allergic and nonallergic rhinitis and LPR and history of emphysema.  I have not seen her in this clinic since 23 March 2018.  Overall she thinks she is doing very well with the issue involving her nose.  She uses nasal ipratropium which helps dry up her nose.  Her use of this medication is about twice a day at this point.  She continues to use a nasal steroid just a few times a week.  She has had almost no issues with her throat and all of her throat clearing has resolved and she has no issues with reflux.  She does not consume caffeine and has been using omeprazole a few times a week.  She does not really have that many respiratory tract symptoms tied up with her emphysema.  She does not use a bronchodilator.  She is limited in her ability to exercise because of various musculoskeletal issues.  She did have a fall about 1 month ago and developed some more musculoskeletal issues as a function of that fall.  She is on Eliquis now for her atrial fibrillation and does occasionally have some dyspnea on exertion when she can exert herself.  Allergies as of 06/15/2018      Reactions   Codeine Nausea And Vomiting   Risedronate Sodium    Upset stomach. Could take Fosamax.   Statins    Muscles hurt, can take low dose   Tape Other (See Comments)   Blisters, Please use "paper" tape   Amoxicillin-pot Clavulanate Diarrhea   Not allergic  2014, Pt can take z pack only Not allergic  2014, Pt can take z pack only      Medication List           albuterol 108 (90 Base) MCG/ACT inhaler Commonly known as:  PROVENTIL HFA;VENTOLIN HFA Inhale 2 puffs  into the lungs every 6 (six) hours as needed for wheezing or shortness of breath.   amiodarone 200 MG tablet Commonly known as:  PACERONE Take 1 tablet (200 mg total) by mouth daily.   apixaban 2.5 MG Tabs tablet Commonly known as:  ELIQUIS Take 1 tablet (2.5 mg total) by mouth 2 (two) times daily.   BENADRYL PO Take by mouth.   cholecalciferol 1000 units tablet Commonly known as:  VITAMIN D Take 1,000 Units by mouth daily.   ezetimibe 10 MG tablet Commonly known as:  ZETIA TAKE 1 TABLET DAILY   fish oil-omega-3 fatty acids 1000 MG capsule Take 1 g by mouth daily.   folic acid 1 MG tablet Commonly known as:  FOLVITE TAKE 1 TABLET DAILY   glucosamine-chondroitin 500-400 MG tablet Take 1 tablet by mouth daily.   HAIR/SKIN/NAILS PO Take 1 tablet by mouth daily.   ipratropium 0.06 % nasal spray Commonly known as:  ATROVENT Place 1-2 sprays into both nostrils 2 (two) times daily as needed for rhinitis.   levothyroxine 100 MCG tablet Commonly known as:  SYNTHROID, LEVOTHROID TAKE 1 TABLET EVERY MORNING ON AN EMPTY STOMACH   loratadine 10 MG tablet Commonly known as:  CLARITIN Take 1 tablet (10 mg total) by mouth daily. Take one tablet  twice daily   Melatonin 3 MG Tabs Take 3 mg by mouth at bedtime.   MULTIPLE VITAMIN PO Take 1 tablet by mouth daily. 50+ Senior Vitamin Daily   NASACORT ALLERGY 24HR 55 MCG/ACT Aero nasal inhaler Generic drug:  triamcinolone Place 1 spray into the nose daily.   omeprazole 40 MG capsule Commonly known as:  PRILOSEC TAKE 1 CAPSULE DAILY   rosuvastatin 5 MG tablet Commonly known as:  CRESTOR TAKE ONE-HALF (1/2) TABLET DAILY   SALINE NASAL SPRAY NA Place 1 spray into both nostrils 2 (two) times daily as needed (congestion).   SYSTANE OP Place 1 drop into both eyes 2 (two) times daily. Use 1-2 Drops in both eyes   vitamin E 400 UNIT capsule Take 400 Units by mouth daily.       Past Medical History:  Diagnosis Date  .  Allergy   . Anemia   . Asymptomatic carotid artery stenosis    R ICA 40% stenosis on CTA 12/2011.  . Bladder polyps   . Cataract    BILATERAL-REMOVED  . CHF (congestive heart failure) ([place])   . Diverticulosis   . Elevated blood pressure 03/25/2011   Bp readings borderline today has hx of elvation  In office and ok at home   Not checke recently   She gfeels was elevated from anxiety    . Episodic recurrent vertigo    MRI Head 2003  . Esophageal spasm   . GERD (gastroesophageal reflux disease)   . Hepatic hemangioma   . History of hepatitis    unknown type  . Hyperlipidemia   . Hyperplastic colon polyp   . Hypothyroidism   . Intestinal metaplasia of gastric mucosa   . Osteoarthritis   . Osteoporosis   . Patellar fracture 07/13/2012  . Scapular fracture 03/31/2012   small from direct blow with trip    . Subclavian steal syndrome    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; CTA 2013 revealed open graft    Past Surgical History:  Procedure Laterality Date  . APPENDECTOMY    . CARDIAC CATHETERIZATION N/A 10/04/2015   Procedure: Right/Left Heart Cath and Coronary Angiography;  Surgeon: Belva Crome, MD;  Location: [facility];  Service: Cardiovascular;  Laterality: N/A;  . CAROTID-SUBCLAVIAN BYPASS GRAFT Left 1999   for [place] steal syndrome  . CHOLECYSTECTOMY    . COLONOSCOPY    . CYSTECTOMY Left    hand  . ELBOW SURGERY Left   . KNEE SURGERY Left 2014  . MITRAL VALVE REPAIR N/A 10/10/2015   Procedure: MITRAL VALVE REPAIR (MVR) WITH SIZE 28 CARPENTIER-EDWARDS PHYSIO II ANNULOPLASTY RING;  Surgeon: [name], MD;  Location: [facility];  Service: Open Heart Surgery;  Laterality: N/A;  . ROTATOR CUFF REPAIR Left   . TEAR DUCT PROBING  07/2014  . TEE WITHOUT CARDIOVERSION N/A 10/03/2015   Procedure: TRANSESOPHAGEAL ECHOCARDIOGRAM (TEE);  Surgeon: Larey Dresser, MD;  Location: [facility];  Service: Cardiovascular;  Laterality: N/A;  . TEE WITHOUT CARDIOVERSION N/A  10/10/2015   Procedure: TRANSESOPHAGEAL ECHOCARDIOGRAM (TEE);  Surgeon: [name], MD;  Location: [facility];  Service: Open Heart Surgery;  Laterality: N/A;  . TONSILLECTOMY    . TUBAL LIGATION      Review of systems negative except as noted in HPI / PMHx or noted below:  Review of Systems  Constitutional: Negative.   HENT: Negative.   Eyes: Negative.   Respiratory: Negative.   Cardiovascular: Negative.  Gastrointestinal: Negative.   Genitourinary: Negative.   Musculoskeletal: Negative.   Skin: Negative.   Neurological: Negative.   Endo/Heme/Allergies: Negative.   Psychiatric/Behavioral: Negative.      Objective:   Vitals:   06/15/18 1043  BP: 128/66  Pulse: 72  Resp: 16          Physical Exam  HENT:  Head: Normocephalic.  Right Ear: Tympanic membrane, external ear and ear canal normal.  Left Ear: Tympanic membrane, external ear and ear canal normal.  Nose: Nose normal. No mucosal edema or rhinorrhea.  Mouth/Throat: Uvula is midline, oropharynx is clear and moist and mucous membranes are normal. No oropharyngeal exudate.  Eyes: Conjunctivae are normal.  Neck: Trachea normal. No tracheal tenderness present. No tracheal deviation present. No thyromegaly present.  Cardiovascular: Normal rate, regular rhythm, S1 normal, S2 normal and normal heart sounds.  No murmur heard. Pulmonary/Chest: Breath sounds normal. No stridor. No respiratory distress. She has no wheezes. She has no rales.  Musculoskeletal: She exhibits no edema.  Lymphadenopathy:       Head (right side): No tonsillar adenopathy present.       Head (left side): No tonsillar adenopathy present.    She has no cervical adenopathy.  Neurological: She is alert.  Skin: No rash noted. She is not diaphoretic. No erythema. Nails show no clubbing.    Diagnostics:    Spirometry was performed and demonstrated an FEV1 of 0.92 at 56 % of predicted.  The patient had an Asthma Control Test with the following  results: ACT Total Score: 24.    Assessment and Plan:   1. Other allergic rhinitis   2. Gastroesophageal reflux disease, esophagitis presence not specified   3. Other emphysema ([place])        1.  Continue to Treat and prevent reflux:   A.  Omeprazole 40 mg tablet 1 time per day  B.  Consolidate caffeine use  2. Continue to treat inflammation:   A. OTC Nasacort 1 spray each nostril one time per day  3. If needed:    A. Duoneb nebulization every 6 hours    B. Loratadine 10 mg one time per day  C. Nasal ipratropium 0.06 - 1-2 sprays each nostril every 6 hours   4. Return to clinic in 1 year or earlier if problem  Overall Cystal appears to be doing pretty well regarding her upper airway issue and her reflux issue and her emphysema appears to be stable and I see so no need for changing her medical therapy at this point in time and I will see her back in this clinic in 1 year or earlier if there is a problem.  Allena Katz, MD Allergy / Immunology [place]

## 2018-06-15 NOTE — Patient Instructions (Addendum)
   1.  Continue to Treat and prevent reflux:   A.  Omeprazole 40 mg tablet 1 time per day  B.  Consolidate caffeine use  2. Continue to treat inflammation:   A. OTC Nasacort 1 spray each nostril one time per day  3. If needed:    A. Duoneb nebulization every 6 hours    B. Loratadine 10 mg one time per day  C. Nasal ipratropium 0.06 - 1-2 sprays each nostril every 6 hours   4. Return to clinic in 1 year or earlier if problem

## 2018-06-16 ENCOUNTER — Encounter: Payer: Self-pay | Admitting: Allergy and Immunology

## 2018-06-17 ENCOUNTER — Telehealth: Payer: Self-pay | Admitting: Allergy and Immunology

## 2018-06-17 MED ORDER — IPRATROPIUM BROMIDE 0.06 % NA SOLN: 1.0000 | Freq: Two times a day (BID) | NASAL | 3 refills | Status: DC | PRN

## 2018-06-17 NOTE — Telephone Encounter (Signed)
Pt called and said that we sent her Atrovent to [facility] and did not want it going there. She [phone].

## 2018-06-17 NOTE — Telephone Encounter (Signed)
[facility] left message advising of change.

## 2018-06-22 ENCOUNTER — Other Ambulatory Visit: Payer: Self-pay | Admitting: Allergy and Immunology

## 2018-06-22 MED ORDER — ALBUTEROL SULFATE HFA 108 (90 BASE) MCG/ACT IN AERS: 2.0000 | INHALATION_SPRAY | Freq: Four times a day (QID) | RESPIRATORY_TRACT | 1 refills | Status: DC | PRN

## 2018-06-22 NOTE — Telephone Encounter (Signed)
Patient has lost her inhaler She uses it on an as needed basis - but cannot find it Can an inhaler be called in to the [facility] on [address]

## 2018-06-22 NOTE — Telephone Encounter (Signed)
Sent script into pharmacy. Left message informing patient script sent.

## 2018-06-28 ENCOUNTER — Telehealth: Payer: Self-pay | Admitting: Internal Medicine

## 2018-06-28 DIAGNOSIS — R296 Repeated falls: Secondary | ICD-10-CM

## 2018-06-28 NOTE — Telephone Encounter (Signed)
Amy Fischer with [facility] [phone]) or [phone] is here today on behalf of the patients family.  They would like to start home health (PT and Nursing) to assist the patient several falls in the home and they would like to have something in place to help prevent this from occurring again.  Janett Billow is here want to get a order to for PT and Nursing.

## 2018-06-28 NOTE — Telephone Encounter (Signed)
I agree please put in orders for pt  nursing  Hx falling

## 2018-06-29 ENCOUNTER — Telehealth: Payer: Self-pay | Admitting: *Deleted

## 2018-06-29 NOTE — Telephone Encounter (Signed)
Left message on machine for patient to return our call to schedule a nurse visit for a Prolia injection.  Patient should schedule appointment after 07/28/18. There will be an approximate $10 administration fee.  CRM created

## 2018-06-30 NOTE — Telephone Encounter (Signed)
I put orders in Epic and spoke with Janett Billow, also I did let referral coordinator know about order.

## 2018-06-30 NOTE — Telephone Encounter (Signed)
I SENT TO  Jessica brown  LPN  [facility]

## 2018-07-15 ENCOUNTER — Telehealth: Payer: Self-pay | Admitting: Cardiology

## 2018-07-15 NOTE — Telephone Encounter (Signed)
LMTCB

## 2018-07-15 NOTE — Telephone Encounter (Signed)
Patient returned call. She reports she feels like she will be dizzy but she is not going in to the "spinning thing", states she is "on the verge" of being dizzy. She is concerned her amiodarone may be causing this - she has been on this medication for a while. She has been having symptoms for several weeks. She does not routinely monitor her BP or HR but reports the last time she checked it it was fine.   Message routed to MD/CVRR for assistance/advice

## 2018-07-15 NOTE — Telephone Encounter (Signed)
Not clear symptoms are related to amiodaraone; decrease to 100 mg daily; follow BP, HR and symptoms Kirk Ruths

## 2018-07-15 NOTE — Telephone Encounter (Signed)
New message   Patient wants to know if medication could be causing dizziness  1) Are you dizzy now? YES  2) Do you feel faint or have you passed out? NO  3) Do you have any other symptoms? NO  4) Have you checked your HR and BP (record if available)? NO

## 2018-07-19 NOTE — Telephone Encounter (Signed)
Advised patient, verbalized understanding  

## 2018-08-02 NOTE — Progress Notes (Signed)
Chief Complaint  Patient presents with  . Follow-up    Prolia injection today // Pt now using a cane for assistance with walking. Discuss Shingles vaccine    HPI: Amy Fischer 84 y.o. come in for Chronic disease management   Osteoporosis  Tog et prolia today  No falling   Still feel off balance  Plans going back to water exercise when school begins( less kids in pool)   Using cane   Advice of  Cards  As on anticoagulation. Had some dizziness  And amiodarone was dec in dose  .  Has puffy feet yesterday pm  Some improved this am  Played bride and had a  Good bit of fritos no  Sob cp changes  ROS: See pertinent positives and negatives per HPI.  Past Medical History:  Diagnosis Date  . Allergy   . Anemia   . Asymptomatic carotid artery stenosis    R ICA 40% stenosis on CTA 12/2011.  . Bladder polyps   . Cataract    BILATERAL-REMOVED  . CHF (congestive heart failure) ([place])   . Diverticulosis   . Elevated blood pressure 03/25/2011   Bp readings borderline today has hx of elvation  In office and ok at home   Not checke recently   She gfeels was elevated from anxiety    . Episodic recurrent vertigo    MRI Head 2003  . Esophageal spasm   . GERD (gastroesophageal reflux disease)   . Hepatic hemangioma   . History of hepatitis    unknown type  . Hyperlipidemia   . Hyperplastic colon polyp   . Hypothyroidism   . Intestinal metaplasia of gastric mucosa   . Osteoarthritis   . Osteoporosis   . Patellar fracture 07/13/2012  . Scapular fracture 03/31/2012   small from direct blow with trip    . Subclavian steal syndrome    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; CTA 2013 revealed open graft    Family History  Problem Relation Age of Onset  . Hypertension Mother   . Stroke Mother   . Heart disease Father   . Colon cancer Neg Hx     Social History   Socioeconomic History  . Marital status: Widowed    Spouse name: Not on file  . Number of children: 4  . Years of  education: Not on file  . Highest education level: Not on file  Occupational History  . Occupation: retired  Scientific laboratory technician  . Financial resource strain: Not on file  . Food insecurity:    Worry: Not on file    Inability: Not on file  . Transportation needs:    Medical: Not on file    Non-medical: Not on file  Tobacco Use  . Smoking status: Former Smoker    Packs/day: 0.75    Years: 20.00    Pack years: 15.00    Last attempt to quit: 01/05/1991    Years since quitting: 27.5  . Smokeless tobacco: Never Used  Substance and Sexual Activity  . Alcohol use: Yes    Alcohol/week: 2.4 oz    Types: 4 Glasses of wine per week    Comment: socially  . Drug use: No  . Sexual activity: Not on file  Lifestyle  . Physical activity:    Days per week: Not on file    Minutes per session: Not on file  . Stress: Not on file  Relationships  . Social connections:  Talks on phone: Not on file    Gets together: Not on file    Attends religious service: Not on file    Active member of club or organization: Not on file    Attends meetings of clubs or organizations: Not on file    Relationship status: Not on file  Other Topics Concern  . Not on file  Social History Narrative   Widowed   HH of 1    No pets   Former smoker   Exercises regularly    Outpatient Medications Prior to Visit  Medication Sig Dispense Refill  . albuterol (PROVENTIL HFA;VENTOLIN HFA) 108 (90 Base) MCG/ACT inhaler Inhale 2 puffs into the lungs every 6 (six) hours as needed for wheezing or shortness of breath. 1 Inhaler 1  . amiodarone (PACERONE) 200 MG tablet Take 200 mg by mouth as directed. Take 1/2 tablet by mouth daily    . apixaban (ELIQUIS) 2.5 MG TABS tablet Take 1 tablet (2.5 mg total) by mouth 2 (two) times daily. 180 tablet 3  . Biotin w/ Vitamins C & E (HAIR/SKIN/NAILS PO) Take 1 tablet by mouth daily.    . cholecalciferol (VITAMIN D) 1000 units tablet Take 1,000 Units by mouth daily.    . DiphenhydrAMINE  HCl (BENADRYL PO) Take by mouth.    . ezetimibe (ZETIA) 10 MG tablet TAKE 1 TABLET DAILY 90 tablet 1  . fish oil-omega-3 fatty acids 1000 MG capsule Take 1 g by mouth daily.     . folic acid (FOLVITE) 1 MG tablet TAKE 1 TABLET DAILY 90 tablet 1  . glucosamine-chondroitin 500-400 MG tablet Take 1 tablet by mouth daily.     [name] ipratropium (ATROVENT) 0.06 % nasal spray Place 1-2 sprays into both nostrils 2 (two) times daily as needed for rhinitis. 45 mL 3  . levothyroxine (SYNTHROID, LEVOTHROID) 100 MCG tablet TAKE 1 TABLET EVERY MORNING ON AN EMPTY STOMACH 90 tablet 1  . loratadine (CLARITIN) 10 MG tablet Take 1 tablet (10 mg total) by mouth daily. Take one tablet twice daily 60 tablet 5  . Melatonin 3 MG TABS Take 3 mg by mouth at bedtime.     . MULTIPLE VITAMIN PO Take 1 tablet by mouth daily. 50+ Senior Vitamin Daily    . omeprazole (PRILOSEC) 40 MG capsule TAKE 1 CAPSULE DAILY (Patient taking differently: TAKE 1 CAPSULE DAILY before dinner) 90 capsule 1  . Polyethyl Glycol-Propyl Glycol (SYSTANE OP) Place 1 drop into both eyes 2 (two) times daily. Use 1-2 Drops in both eyes    . rosuvastatin (CRESTOR) 5 MG tablet TAKE ONE-HALF (1/2) TABLET DAILY 90 tablet 1  . SALINE NASAL SPRAY NA Place 1 spray into both nostrils 2 (two) times daily as needed (congestion).     . triamcinolone (NASACORT ALLERGY 24HR) 55 MCG/ACT AERO nasal inhaler Place 1 spray into the nose daily.    . vitamin E 400 UNIT capsule Take 400 Units by mouth daily.     No facility-administered medications prior to visit.      EXAM:  BP 122/70 (BP Location: Right Arm, Patient Position: Sitting, Cuff Size: Normal)   Pulse 71   Temp 97.7 F (36.5 C) (Oral)   Wt 121 lb 12.8 oz (55.2 kg)   BMI 22.28 kg/m   Body mass index is 22.28 kg/m.  GENERAL: vitals reviewed and listed above, alert, oriented, appears well hydrated and in no acute distress uses can ambulatory  Independent  HEENT: atraumatic, conjunctiva  clear, no obvious  abnormalities on inspection of external nose and ears   NECK: no obvious masses on inspection palpation  LUNGS: clear to auscultation bilaterally, no wheezes, rales or rhonchi, CV: HRRR, no clubbing cyanosis or  [puffy feet  Min edema above ankle  No ulcers or bruising  nl cap refill  MS: moves all extremities without noticeable   djd changes  PSYCH: pleasant and cooperative, no obvious depression or anxiety Lab Results  Component Value Date   WBC 6.5 04/23/2018   HGB 13.7 04/23/2018   HCT 42.5 04/23/2018   PLT 241 04/23/2018   GLUCOSE 83 02/02/2018   CHOL 159 08/18/2017   TRIG 142.0 08/18/2017   HDL 62.80 08/18/2017   LDLCALC 68 08/18/2017   ALT 13 08/18/2017   AST 17 08/18/2017   NA 141 02/02/2018   K 4.4 02/02/2018   CL 106 02/02/2018   CREATININE 0.68 02/02/2018   BUN 15 02/02/2018   CO2 30 02/02/2018   TSH 0.43 02/02/2018   INR 1.02 01/12/2016   HGBA1C 5.1 10/14/2016   BP Readings from Last 3 Encounters:  08/03/18 122/70  06/15/18 128/66  05/27/18 136/68   Wt Readings from Last 3 Encounters:  08/03/18 121 lb 12.8 oz (55.2 kg)  05/27/18 120 lb (54.4 kg)  05/11/18 116 lb 11.2 oz (52.9 kg)     ASSESSMENT AND PLAN:  Discussed the following assessment and plan:  Osteoporosis, unspecified osteoporosis type, unspecified pathological fracture presence - Plan: TSH, Lipid panel, CMP, denosumab (PROLIA) injection 60 mg  Medication management - Plan: TSH, Lipid panel, CMP  Hypertension, isolated systolic - Plan: TSH, Lipid panel, CMP  Hypothyroidism, unspecified type - Plan: TSH, Lipid panel, CMP  Chronic diastolic CHF (congestive heart failure), NYHA class 2 (HCC) - Plan: TSH, Lipid panel, CMP  S/P MVR (mitral valve repair) - Plan: TSH, Lipid panel, CMPbe  Smart with  sodium intake  no evidence of decompensated hf otherwise   Fu if sig edema occurring  Checking labs today  Monitoring  prolia today   Counseled. Disc shingrix vaccine  Total visit 21mins > 50% spent  counseling and coordinating care as indicated in above note and in instructions to patient .  -Patient advised to return or notify health care team  if  new concerns arise.  Patient Instructions  Lab work today monitoring .   Get shingrix at your pharmacy as discussed .   Agree with mobility support to prevent falling risk.   Excess sodium may add to getting swollen feet at times.   prolia today .      [name]. Kashmere Daywalt M.D.

## 2018-08-03 ENCOUNTER — Encounter: Payer: Self-pay | Admitting: Internal Medicine

## 2018-08-03 ENCOUNTER — Ambulatory Visit (INDEPENDENT_AMBULATORY_CARE_PROVIDER_SITE_OTHER): Payer: Medicare Other | Admitting: Internal Medicine

## 2018-08-03 VITALS — BP 122/70 | HR 71 | Temp 97.7°F | Wt 121.8 lb

## 2018-08-03 DIAGNOSIS — M810 Age-related osteoporosis without current pathological fracture: Secondary | ICD-10-CM

## 2018-08-03 DIAGNOSIS — I6523 Occlusion and stenosis of bilateral carotid arteries: Secondary | ICD-10-CM

## 2018-08-03 DIAGNOSIS — I10 Essential (primary) hypertension: Secondary | ICD-10-CM

## 2018-08-03 DIAGNOSIS — Z9889 Other specified postprocedural states: Secondary | ICD-10-CM

## 2018-08-03 DIAGNOSIS — Z79899 Other long term (current) drug therapy: Secondary | ICD-10-CM | POA: Diagnosis not present

## 2018-08-03 DIAGNOSIS — E039 Hypothyroidism, unspecified: Secondary | ICD-10-CM

## 2018-08-03 DIAGNOSIS — I5032 Chronic diastolic (congestive) heart failure: Secondary | ICD-10-CM

## 2018-08-03 LAB — COMPREHENSIVE METABOLIC PANEL
ALK PHOS: 66 U/L (ref 39–117)
ALT: 16 U/L (ref 0–35)
AST: 19 U/L (ref 0–37)
Albumin: 4 g/dL (ref 3.5–5.2)
BILIRUBIN TOTAL: 0.6 mg/dL (ref 0.2–1.2)
BUN: 13 mg/dL (ref 6–23)
CO2: 30 meq/L (ref 19–32)
Calcium: 10 mg/dL (ref 8.4–10.5)
Chloride: 105 mEq/L (ref 96–112)
Creatinine, Ser: 0.86 mg/dL (ref 0.40–1.20)
GFR: 66.29 mL/min (ref >60.00)
Glucose, Bld: 76 mg/dL (ref 70–99)
POTASSIUM: 5.1 meq/L (ref 3.5–5.1)
SODIUM: 140 meq/L (ref 135–145)
TOTAL PROTEIN: 6.5 g/dL (ref 6.0–8.3)

## 2018-08-03 LAB — LIPID PANEL
CHOL/HDL RATIO: 2
Cholesterol: 157 mg/dL (ref 0–200)
HDL: 65.2 mg/dL (ref >39.00)
LDL Cholesterol: 66 mg/dL (ref 0–99)
NONHDL: 92.02
Triglycerides: 131 mg/dL (ref 0.0–149.0)
VLDL: 26.2 mg/dL (ref 0.0–40.0)

## 2018-08-03 LAB — TSH: TSH: 1.41 u[IU]/mL (ref 0.35–4.50)

## 2018-08-03 MED ORDER — DENOSUMAB 60 MG/ML ~~LOC~~ SOSY
60.0000 mg | PREFILLED_SYRINGE | Freq: Once | SUBCUTANEOUS | Status: AC
  Administered 2018-08-03: 60 mg via SUBCUTANEOUS

## 2018-08-03 NOTE — Progress Notes (Signed)
After obtaining consent, and per orders of Dr. Regis Bill, injection of Prolia 60mg /mL given by GREEN, ASHTYN M in right arm today. Pt tolerated well.

## 2018-08-03 NOTE — Patient Instructions (Signed)
Lab work today monitoring .   Get shingrix at your pharmacy as discussed .   Agree with mobility support to prevent falling risk.   Excess sodium may add to getting swollen feet at times.   prolia today .

## 2018-08-04 ENCOUNTER — Encounter: Payer: Self-pay | Admitting: *Deleted

## 2018-09-17 ENCOUNTER — Other Ambulatory Visit: Payer: Self-pay | Admitting: Internal Medicine

## 2018-09-22 ENCOUNTER — Other Ambulatory Visit: Payer: Self-pay | Admitting: Internal Medicine

## 2018-11-04 NOTE — Progress Notes (Signed)
Chief Complaint  Patient presents with  . Gait Problem  . Shortness of Breath  . Generalized Body Aches    HPI: Amy Fischer 84 y.o. come in for Chronic disease management  cocnern about balance and sob  Under caer for hs  DHF cad and  Over the last 10 to 12 months she has noted increasing shortness of breath decreased exercise tolerance more problem with balance although no recent falling and feeling tired and recently her appetite is down. No new medicines recently has been on Eliquis and amiodarone for months.  No unusual bleeding. Does have a history of peripheral neuropathy seen by neurologist a number of years ago but never followed up because her cardio of vascular valve problem intervene and she required heart surgery and other care. No history of diabetes She does see the allergist. Legs ache at times but no significant swelling.  She tries to go to the pool and do her walking exercises.  ROS: See pertinent positives and negatives per HPI.  Sleep okay sometimes poor and then catches out.  She lives alone has access to family using a cane.    Past Medical History:  Diagnosis Date  . Allergy   . Anemia   . Asymptomatic carotid artery stenosis    R ICA 40% stenosis on CTA 12/2011.  . Bladder polyps   . Cataract    BILATERAL-REMOVED  . CHF (congestive heart failure) ([place])   . Diverticulosis   . Elevated blood pressure 03/25/2011   Bp readings borderline today has hx of elvation  In office and ok at home   Not checke recently   She gfeels was elevated from anxiety    . Episodic recurrent vertigo    MRI Head 2003  . Esophageal spasm   . GERD (gastroesophageal reflux disease)   . Hepatic hemangioma   . History of hepatitis    unknown type  . Hyperlipidemia   . Hyperplastic colon polyp   . Hypothyroidism   . Intestinal metaplasia of gastric mucosa   . Osteoarthritis   . Osteoporosis   . Patellar fracture 07/13/2012  . Scapular fracture 03/31/2012   small from  direct blow with trip    . Subclavian steal syndrome    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; CTA 2013 revealed open graft    Family History  Problem Relation Age of Onset  . Hypertension Mother   . Stroke Mother   . Heart disease Father   . Colon cancer Neg Hx     Social History   Socioeconomic History  . Marital status: Widowed    Spouse name: Not on file  . Number of children: 4  . Years of education: Not on file  . Highest education level: Not on file  Occupational History  . Occupation: retired  Scientific laboratory technician  . Financial resource strain: Not on file  . Food insecurity:    Worry: Not on file    Inability: Not on file  . Transportation needs:    Medical: Not on file    Non-medical: Not on file  Tobacco Use  . Smoking status: Former Smoker    Packs/day: 0.75    Years: 20.00    Pack years: 15.00    Last attempt to quit: 01/05/1991    Years since quitting: 27.8  . Smokeless tobacco: Never Used  Substance and Sexual Activity  . Alcohol use: Yes    Alcohol/week: 4.0 standard drinks    Types:  4 Glasses of wine per week    Comment: socially  . Drug use: No  . Sexual activity: Not on file  Lifestyle  . Physical activity:    Days per week: Not on file    Minutes per session: Not on file  . Stress: Not on file  Relationships  . Social connections:    Talks on phone: Not on file    Gets together: Not on file    Attends religious service: Not on file    Active member of club or organization: Not on file    Attends meetings of clubs or organizations: Not on file    Relationship status: Not on file  Other Topics Concern  . Not on file  Social History Narrative   Widowed   HH of 1    No pets   Former smoker   Exercises regularly    Outpatient Medications Prior to Visit  Medication Sig Dispense Refill  . albuterol (PROVENTIL HFA;VENTOLIN HFA) 108 (90 Base) MCG/ACT inhaler Inhale 2 puffs into the lungs every 6 (six) hours as needed for wheezing or  shortness of breath. 1 Inhaler 1  . amiodarone (PACERONE) 200 MG tablet Take 200 mg by mouth as directed. Take 1/2 tablet by mouth daily    . apixaban (ELIQUIS) 2.5 MG TABS tablet Take 1 tablet (2.5 mg total) by mouth 2 (two) times daily. 180 tablet 3  . Biotin w/ Vitamins C & E (HAIR/SKIN/NAILS PO) Take 1 tablet by mouth daily.    . cholecalciferol (VITAMIN D) 1000 units tablet Take 1,000 Units by mouth daily.    . DiphenhydrAMINE HCl (BENADRYL PO) Take by mouth.    . ezetimibe (ZETIA) 10 MG tablet TAKE 1 TABLET DAILY 90 tablet 1  . fish oil-omega-3 fatty acids 1000 MG capsule Take 1 g by mouth daily.     . folic acid (FOLVITE) 1 MG tablet TAKE 1 TABLET DAILY 90 tablet 1  . glucosamine-chondroitin 500-400 MG tablet Take 1 tablet by mouth daily.     [name] ipratropium (ATROVENT) 0.06 % nasal spray Place 1-2 sprays into both nostrils 2 (two) times daily as needed for rhinitis. 45 mL 3  . levothyroxine (SYNTHROID, LEVOTHROID) 100 MCG tablet TAKE 1 TABLET EVERY MORNING ON AN EMPTY STOMACH 90 tablet 1  . loratadine (CLARITIN) 10 MG tablet Take 1 tablet (10 mg total) by mouth daily. Take one tablet twice daily 60 tablet 5  . Melatonin 3 MG TABS Take 3 mg by mouth at bedtime.     . MULTIPLE VITAMIN PO Take 1 tablet by mouth daily. 50+ Senior Vitamin Daily    . omeprazole (PRILOSEC) 40 MG capsule TAKE 1 CAPSULE DAILY (Patient taking differently: TAKE 1 CAPSULE DAILY before dinner) 90 capsule 1  . Polyethyl Glycol-Propyl Glycol (SYSTANE OP) Place 1 drop into both eyes 2 (two) times daily. Use 1-2 Drops in both eyes    . rosuvastatin (CRESTOR) 5 MG tablet TAKE ONE-HALF (1/2) TABLET DAILY 90 tablet 1  . SALINE NASAL SPRAY NA Place 1 spray into both nostrils 2 (two) times daily as needed (congestion).     . triamcinolone (NASACORT ALLERGY 24HR) 55 MCG/ACT AERO nasal inhaler Place 1 spray into the nose daily.    . vitamin E 400 UNIT capsule Take 400 Units by mouth daily.     No facility-administered medications  prior to visit.      EXAM:  BP 122/80 (BP Location: Left Arm, Patient Position: Sitting, Cuff Size: Normal)  Pulse 78   Temp (!) 97.4 F (36.3 C) (Oral)   Wt 122 lb 1.6 oz (55.4 kg)   SpO2 94%   BMI 22.33 kg/m   Body mass index is 22.33 kg/m.  GENERAL: vitals reviewed and listed above, alert, oriented, appears well hydrated and in no acute distress HEENT: atraumatic, conjunctiva  clear, no obvious abnormalities on inspection of external nose and ears NECK: no obvious masses on inspection palpation  LUNGS: clear to auscultation bilaterally, no wheezes, rales or rhonchi, good air movement CV: HRRR, no clubbing cyanosis or  peripheral edema nl cap refill  MS: moves all extremities without noticeable focal  abnormality PSYCH: pleasant and cooperative, no obvious depression or anxiety Gait   With cane  Independent    Non focal exam  Lab Results  Component Value Date   WBC 4.2 11/05/2018   HGB 14.3 11/05/2018   HCT 41.9 11/05/2018   PLT 192.0 11/05/2018   GLUCOSE 83 11/05/2018   CHOL 157 08/03/2018   TRIG 131.0 08/03/2018   HDL 65.20 08/03/2018   LDLCALC 66 08/03/2018   ALT 15 11/05/2018   AST 19 11/05/2018   NA 141 11/05/2018   K 4.6 11/05/2018   CL 106 11/05/2018   CREATININE 0.82 11/05/2018   BUN 18 11/05/2018   CO2 29 11/05/2018   TSH 1.62 11/05/2018   INR 1.02 01/12/2016   HGBA1C 5.1 10/14/2016   BP Readings from Last 3 Encounters:  11/05/18 122/80  08/03/18 122/70  06/15/18 128/66   Wt Readings from Last 3 Encounters:  11/05/18 122 lb 1.6 oz (55.4 kg)  08/03/18 121 lb 12.8 oz (55.2 kg)  05/27/18 120 lb (54.4 kg)     ASSESSMENT AND PLAN:  Discussed the following assessment and plan:  Shortness of breath - Plan: CBC with Differential/Platelet, Vitamin B12, Brain natriuretic peptide, TSH, CMP  Balance problem - Plan: CBC with Differential/Platelet, Vitamin B12, Brain natriuretic peptide, TSH, CMP, Ambulatory referral to Neurology  Neuropathy - Plan:  CBC with Differential/Platelet, Vitamin B12, Brain natriuretic peptide, TSH, CMP, Ambulatory referral to Neurology  Chronic diastolic CHF (congestive heart failure), NYHA class 2 ([place]) - Plan: CBC with Differential/Platelet, Vitamin B12, Brain natriuretic peptide, TSH, CMP  Low vitamin B12 level - Plan: CBC with Differential/Platelet, Vitamin B12, Brain natriuretic peptide, TSH, CMP  Hx of fall - Plan: CBC with Differential/Platelet, Vitamin B12, Brain natriuretic peptide, TSH, CMP  S/P MVR (mitral valve repair) - Plan: CBC with Differential/Platelet, Vitamin B12 Reviewed her med list uncertain why she is on a number of vitamins such as vitamin D biotin C and E perhaps for her hair. She is not on B12 but may have been on it in the past advise recheck level wonder if this could add to her symptom complex. She has chronic diastolic heart failure history of A. fib but not active today.  Minimal does not seem like lung disease would be the main culprit here. Plan screening labs and if appropriate to get back with neurology in regard to her peripheral neuropathy and balance.  She does state I am 84 years old" but she is a "young" 57 and would be reasonable look for reversible causes. -Patient advised to return or notify health care team  if  new concerns arise.  Patient Instructions   Need to check your  b12 levels    And blood count  In related  to the neuropathy.    May get  Neurology to see you again.   After  the labs back   Many reason for  Sx could be anemia  Heart  Lung   .  Stop the extra vitamins unless  Prescribed for a condition . Will send information  to cardiology also   Wt Readings from Last 3 Encounters:  11/05/18 122 lb 1.6 oz (55.4 kg)  08/03/18 121 lb 12.8 oz (55.2 kg)  05/27/18 120 lb (54.4 kg)       [name] M.D.

## 2018-11-05 ENCOUNTER — Ambulatory Visit (INDEPENDENT_AMBULATORY_CARE_PROVIDER_SITE_OTHER): Payer: Medicare Other | Admitting: Internal Medicine

## 2018-11-05 ENCOUNTER — Encounter: Payer: Self-pay | Admitting: Internal Medicine

## 2018-11-05 VITALS — BP 122/80 | HR 78 | Temp 97.4°F | Wt 122.1 lb

## 2018-11-05 DIAGNOSIS — G629 Polyneuropathy, unspecified: Secondary | ICD-10-CM

## 2018-11-05 DIAGNOSIS — Z9889 Other specified postprocedural states: Secondary | ICD-10-CM | POA: Diagnosis not present

## 2018-11-05 DIAGNOSIS — Z9181 History of falling: Secondary | ICD-10-CM | POA: Diagnosis not present

## 2018-11-05 DIAGNOSIS — R0602 Shortness of breath: Secondary | ICD-10-CM | POA: Diagnosis not present

## 2018-11-05 DIAGNOSIS — R7989 Other specified abnormal findings of blood chemistry: Secondary | ICD-10-CM

## 2018-11-05 DIAGNOSIS — I5032 Chronic diastolic (congestive) heart failure: Secondary | ICD-10-CM

## 2018-11-05 DIAGNOSIS — I6523 Occlusion and stenosis of bilateral carotid arteries: Secondary | ICD-10-CM

## 2018-11-05 DIAGNOSIS — E538 Deficiency of other specified B group vitamins: Secondary | ICD-10-CM

## 2018-11-05 DIAGNOSIS — R2689 Other abnormalities of gait and mobility: Secondary | ICD-10-CM | POA: Diagnosis not present

## 2018-11-05 LAB — COMPREHENSIVE METABOLIC PANEL
ALK PHOS: 59 U/L (ref 39–117)
ALT: 15 U/L (ref 0–35)
AST: 19 U/L (ref 0–37)
Albumin: 3.9 g/dL (ref 3.5–5.2)
BILIRUBIN TOTAL: 0.7 mg/dL (ref 0.2–1.2)
BUN: 18 mg/dL (ref 6–23)
CALCIUM: 9.6 mg/dL (ref 8.4–10.5)
CO2: 29 meq/L (ref 19–32)
CREATININE: 0.82 mg/dL (ref 0.40–1.20)
Chloride: 106 mEq/L (ref 96–112)
GFR: 69.99 mL/min (ref >60.00)
GLUCOSE: 83 mg/dL (ref 70–99)
Potassium: 4.6 mEq/L (ref 3.5–5.1)
Sodium: 141 mEq/L (ref 135–145)
Total Protein: 6.4 g/dL (ref 6.0–8.3)

## 2018-11-05 LAB — CBC WITH DIFFERENTIAL/PLATELET
BASOS ABS: 0 10*3/uL (ref 0.0–0.1)
Basophils Relative: 1.1 % (ref 0.0–3.0)
EOS ABS: 0.1 10*3/uL (ref 0.0–0.7)
Eosinophils Relative: 3 % (ref 0.0–5.0)
HCT: 41.9 % (ref 36.0–46.0)
HEMOGLOBIN: 14.3 g/dL (ref 12.0–15.0)
LYMPHS ABS: 0.7 10*3/uL (ref 0.7–4.0)
Lymphocytes Relative: 16.3 % (ref 12.0–46.0)
MCHC: 34.2 g/dL (ref 30.0–36.0)
MCV: 96.7 fl (ref 78.0–100.0)
MONO ABS: 0.5 10*3/uL (ref 0.1–1.0)
Monocytes Relative: 11.1 % (ref 3.0–12.0)
NEUTROS PCT: 68.5 % (ref 43.0–77.0)
Neutro Abs: 2.9 10*3/uL (ref 1.4–7.7)
Platelets: 192 10*3/uL (ref 150.0–400.0)
RBC: 4.34 Mil/uL (ref 3.87–5.11)
RDW: 14.4 % (ref 11.5–15.5)
WBC: 4.2 10*3/uL (ref 4.0–10.5)

## 2018-11-05 LAB — TSH: TSH: 1.62 u[IU]/mL (ref 0.35–4.50)

## 2018-11-05 LAB — BRAIN NATRIURETIC PEPTIDE: PRO B NATRI PEPTIDE: 144 pg/mL — AB (ref 0.0–100.0)

## 2018-11-05 LAB — VITAMIN B12: VITAMIN B 12: 172 pg/mL — AB (ref 211–911)

## 2018-11-05 NOTE — Patient Instructions (Addendum)
Need to check your  b12 levels    And blood count  In related  to the neuropathy.    May get  Neurology to see you again.   After the labs back   Many reason for  Sx could be anemia  Heart  Lung   .  Stop the extra vitamins unless  Prescribed for a condition . Will send information  to cardiology also   Wt Readings from Last 3 Encounters:  11/05/18 122 lb 1.6 oz (55.4 kg)  08/03/18 121 lb 12.8 oz (55.2 kg)  05/27/18 120 lb (54.4 kg)

## 2018-11-08 NOTE — Progress Notes (Signed)
Amy Fischer Amy Fischer  

## 2018-11-09 ENCOUNTER — Encounter: Payer: Self-pay | Admitting: Neurology

## 2018-11-18 DIAGNOSIS — Z961 Presence of intraocular lens: Secondary | ICD-10-CM | POA: Diagnosis not present

## 2018-11-18 DIAGNOSIS — H04202 Unspecified epiphora, left lacrimal gland: Secondary | ICD-10-CM | POA: Diagnosis not present

## 2018-12-07 DIAGNOSIS — H019 Unspecified inflammation of eyelid: Secondary | ICD-10-CM | POA: Diagnosis not present

## 2018-12-07 DIAGNOSIS — H04551 Acquired stenosis of right nasolacrimal duct: Secondary | ICD-10-CM | POA: Diagnosis not present

## 2018-12-07 DIAGNOSIS — H04552 Acquired stenosis of left nasolacrimal duct: Secondary | ICD-10-CM | POA: Diagnosis not present

## 2018-12-07 DIAGNOSIS — H04123 Dry eye syndrome of bilateral lacrimal glands: Secondary | ICD-10-CM | POA: Diagnosis not present

## 2018-12-07 DIAGNOSIS — H04222 Epiphora due to insufficient drainage, left lacrimal gland: Secondary | ICD-10-CM | POA: Diagnosis not present

## 2018-12-17 NOTE — Progress Notes (Signed)
HPI: Follow-up mitral valve repair. Patient was in the hospital in October 2016 with congestive heart failure. Transesophageal echocardiogram showed normal LV function. There was a flail posterior leaflet with severe mitral regurgitation. There was mild to moderate tricuspid regurgitation. Cardiac catheterization October 2016 showed a 30% LAD, 60% first diagonal and 30% right coronary artery. Ejection fraction 60%. Patient subsequent had mitral valve repair. Note preoperative carotid Dopplers showed 1-39% stenosis. Note the patient had atrial fibrillation after surgery.  Follow-up monitor March 2019 showed sinus rhythm with PVCs and paroxysmal atrial flutter/fibrillation.  Last echocardiogram April 2019 showed normal LV function, moderate left ventricular hypertrophy, prior mitral valve repair and mild tricuspid regurgitation.  Patient placed on amiodarone and apixaban.  Since last seen,  there is no dyspnea, chest pain, palpitations, syncope or bleeding.  Current Outpatient Medications  Medication Sig Dispense Refill  . albuterol (PROVENTIL HFA;VENTOLIN HFA) 108 (90 Base) MCG/ACT inhaler Inhale 2 puffs into the lungs every 6 (six) hours as needed for wheezing or shortness of breath. 1 Inhaler 1  . amiodarone (PACERONE) 200 MG tablet Take 200 mg by mouth as directed. Take 1/2 tablet by mouth daily    . apixaban (ELIQUIS) 2.5 MG TABS tablet Take 1 tablet (2.5 mg total) by mouth 2 (two) times daily. 180 tablet 3  . Biotin w/ Vitamins C & E (HAIR/SKIN/NAILS PO) Take 1 tablet by mouth daily.    . cholecalciferol (VITAMIN D) 1000 units tablet Take 1,000 Units by mouth daily.    . DiphenhydrAMINE HCl (BENADRYL PO) Take by mouth.    . ezetimibe (ZETIA) 10 MG tablet TAKE 1 TABLET DAILY 90 tablet 1  . fish oil-omega-3 fatty acids 1000 MG capsule Take 1 g by mouth daily.     . folic acid (FOLVITE) 1 MG tablet TAKE 1 TABLET DAILY 90 tablet 1  . glucosamine-chondroitin 500-400 MG tablet Take 1 tablet by  mouth daily.     [name] ipratropium (ATROVENT) 0.06 % nasal spray Place 1-2 sprays into both nostrils 2 (two) times daily as needed for rhinitis. 45 mL 3  . levothyroxine (SYNTHROID, LEVOTHROID) 100 MCG tablet TAKE 1 TABLET EVERY MORNING ON AN EMPTY STOMACH 90 tablet 1  . loratadine (CLARITIN) 10 MG tablet Take 1 tablet (10 mg total) by mouth daily. Take one tablet twice daily 60 tablet 5  . Melatonin 3 MG TABS Take 3 mg by mouth at bedtime.     . MULTIPLE VITAMIN PO Take 1 tablet by mouth daily. 50+ Senior Vitamin Daily    . omeprazole (PRILOSEC) 40 MG capsule TAKE 1 CAPSULE DAILY (Patient taking differently: TAKE 1 CAPSULE DAILY before dinner) 90 capsule 1  . Polyethyl Glycol-Propyl Glycol (SYSTANE OP) Place 1 drop into both eyes 2 (two) times daily. Use 1-2 Drops in both eyes    . rosuvastatin (CRESTOR) 5 MG tablet TAKE ONE-HALF (1/2) TABLET DAILY 90 tablet 1  . SALINE NASAL SPRAY NA Place 1 spray into both nostrils 2 (two) times daily as needed (congestion).     . triamcinolone (NASACORT ALLERGY 24HR) 55 MCG/ACT AERO nasal inhaler Place 1 spray into the nose daily.    . vitamin E 400 UNIT capsule Take 400 Units by mouth daily.     No current facility-administered medications for this visit.      Past Medical History:  Diagnosis Date  . Allergy   . Anemia   . Asymptomatic carotid artery stenosis    R ICA 40% stenosis on CTA  12/2011.  . Bladder polyps   . Cataract    BILATERAL-REMOVED  . CHF (congestive heart failure) ([place])   . Diverticulosis   . Elevated blood pressure 03/25/2011   Bp readings borderline today has hx of elvation  In office and ok at home   Not checke recently   She gfeels was elevated from anxiety    . Episodic recurrent vertigo    MRI Head 2003  . Esophageal spasm   . GERD (gastroesophageal reflux disease)   . Hepatic hemangioma   . History of hepatitis    unknown type  . Hyperlipidemia   . Hyperplastic colon polyp   . Hypothyroidism   . Intestinal metaplasia of  gastric mucosa   . Osteoarthritis   . Osteoporosis   . Patellar fracture 07/13/2012  . Scapular fracture 03/31/2012   small from direct blow with trip    . Subclavian steal syndrome    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; CTA 2013 revealed open graft    Past Surgical History:  Procedure Laterality Date  . APPENDECTOMY    . CARDIAC CATHETERIZATION N/A 10/04/2015   Procedure: Right/Left Heart Cath and Coronary Angiography;  Surgeon: Belva Crome, MD;  Location: [facility];  Service: Cardiovascular;  Laterality: N/A;  . CAROTID-SUBCLAVIAN BYPASS GRAFT Left 1999   for Hale steal syndrome  . CHOLECYSTECTOMY    . COLONOSCOPY    . CYSTECTOMY Left    hand  . ELBOW SURGERY Left   . KNEE SURGERY Left 2014  . MITRAL VALVE REPAIR N/A 10/10/2015   Procedure: MITRAL VALVE REPAIR (MVR) WITH SIZE 28 CARPENTIER-EDWARDS PHYSIO II ANNULOPLASTY RING;  Surgeon: [name], MD;  Location: [facility];  Service: Open Heart Surgery;  Laterality: N/A;  . ROTATOR CUFF REPAIR Left   . TEAR DUCT PROBING  07/2014  . TEE WITHOUT CARDIOVERSION N/A 10/03/2015   Procedure: TRANSESOPHAGEAL ECHOCARDIOGRAM (TEE);  Surgeon: Larey Dresser, MD;  Location: [facility];  Service: Cardiovascular;  Laterality: N/A;  . TEE WITHOUT CARDIOVERSION N/A 10/10/2015   Procedure: TRANSESOPHAGEAL ECHOCARDIOGRAM (TEE);  Surgeon: [name], MD;  Location: [facility];  Service: Open Heart Surgery;  Laterality: N/A;  . TONSILLECTOMY    . TUBAL LIGATION      Social History   Socioeconomic History  . Marital status: Widowed    Spouse name: Not on file  . Number of children: 4  . Years of education: Not on file  . Highest education level: Not on file  Occupational History  . Occupation: retired  Scientific laboratory technician  . Financial resource strain: Not on file  . Food insecurity:    Worry: Not on file    Inability: Not on file  . Transportation needs:    Medical: Not on file    Non-medical: Not on file  Tobacco  Use  . Smoking status: Former Smoker    Packs/day: 0.75    Years: 20.00    Pack years: 15.00    Last attempt to quit: 01/05/1991    Years since quitting: 27.9  . Smokeless tobacco: Never Used  Substance and Sexual Activity  . Alcohol use: Yes    Alcohol/week: 4.0 standard drinks    Types: 4 Glasses of wine per week    Comment: socially  . Drug use: No  . Sexual activity: Not on file  Lifestyle  . Physical activity:    Days per week: Not on file    Minutes per session: Not  on file  . Stress: Not on file  Relationships  . Social connections:    Talks on phone: Not on file    Gets together: Not on file    Attends religious service: Not on file    Active member of club or organization: Not on file    Attends meetings of clubs or organizations: Not on file    Relationship status: Not on file  . Intimate partner violence:    Fear of current or ex partner: Not on file    Emotionally abused: Not on file    Physically abused: Not on file    Forced sexual activity: Not on file  Other Topics Concern  . Not on file  Social History Narrative   Widowed   HH of 1    No pets   Former smoker   Exercises regularly    Family History  Problem Relation Age of Onset  . Hypertension Mother   . Stroke Mother   . Heart disease Father   . Colon cancer Neg Hx     ROS: no fevers or chills, productive cough, hemoptysis, dysphasia, odynophagia, melena, hematochezia, dysuria, hematuria, rash, seizure activity, orthopnea, PND, pedal edema, claudication. Remaining systems are negative.  Physical Exam: Well-developed well-nourished in no acute distress.  Skin is warm and dry.  HEENT is normal.  Neck is supple.  Chest is clear to auscultation with normal expansion.  Cardiovascular exam is regular rate and rhythm.  Abdominal exam nontender or distended. No masses palpated. Extremities show no edema. neuro grossly intact  ECG-sinus rhythm at a rate of 86, first-degree AV block, cannot rule  out prior inferior infarct.  Personally reviewed  A/P  1 paroxysmal atrial fibrillation-patient is in sinus rhythm today.  Plan to continue amiodarone and apixaban at present dose.  Check chest x-ray; recent TSH, liver functions and renal function checked by primary care.  2 previous mitral valve repair-continue SBE prophylaxis.  3 coronary artery disease-patient denies chest pain.  Continue statin.  She is not on aspirin given need for apixaban.  4 hypertension-patient's blood pressure is controlled.  Continue present medications and follow.  5 hyperlipidemia-continue statin.    Kirk Ruths, MD

## 2018-12-27 DIAGNOSIS — H04542 Stenosis of left lacrimal canaliculi: Secondary | ICD-10-CM | POA: Diagnosis not present

## 2018-12-27 DIAGNOSIS — H04223 Epiphora due to insufficient drainage, bilateral lacrimal glands: Secondary | ICD-10-CM | POA: Diagnosis not present

## 2018-12-27 DIAGNOSIS — H04552 Acquired stenosis of left nasolacrimal duct: Secondary | ICD-10-CM | POA: Diagnosis not present

## 2018-12-27 DIAGNOSIS — H04221 Epiphora due to insufficient drainage, right lacrimal gland: Secondary | ICD-10-CM | POA: Diagnosis not present

## 2018-12-27 DIAGNOSIS — H04412 Chronic dacryocystitis of left lacrimal passage: Secondary | ICD-10-CM | POA: Diagnosis not present

## 2018-12-27 DIAGNOSIS — H04222 Epiphora due to insufficient drainage, left lacrimal gland: Secondary | ICD-10-CM | POA: Diagnosis not present

## 2018-12-27 DIAGNOSIS — H04411 Chronic dacryocystitis of right lacrimal passage: Secondary | ICD-10-CM | POA: Diagnosis not present

## 2018-12-27 DIAGNOSIS — H02055 Trichiasis without entropian left lower eyelid: Secondary | ICD-10-CM | POA: Diagnosis not present

## 2018-12-28 ENCOUNTER — Ambulatory Visit (HOSPITAL_COMMUNITY)
Admission: RE | Admit: 2018-12-28 | Discharge: 2018-12-28 | Disposition: A | Discharge status: Home / Self Care | Payer: Medicare Other | Source: Ambulatory Visit | Attending: Cardiology | Admitting: Cardiology

## 2018-12-28 ENCOUNTER — Ambulatory Visit (INDEPENDENT_AMBULATORY_CARE_PROVIDER_SITE_OTHER): Payer: Medicare Other | Admitting: Cardiology

## 2018-12-28 ENCOUNTER — Encounter: Payer: Self-pay | Admitting: Cardiology

## 2018-12-28 VITALS — BP 126/78 | HR 86 | Ht 61.0 in | Wt 122.0 lb

## 2018-12-28 DIAGNOSIS — I6523 Occlusion and stenosis of bilateral carotid arteries: Secondary | ICD-10-CM

## 2018-12-28 DIAGNOSIS — I480 Paroxysmal atrial fibrillation: Secondary | ICD-10-CM | POA: Insufficient documentation

## 2018-12-28 DIAGNOSIS — I10 Essential (primary) hypertension: Secondary | ICD-10-CM

## 2018-12-28 DIAGNOSIS — E7800 Pure hypercholesterolemia, unspecified: Secondary | ICD-10-CM | POA: Diagnosis not present

## 2018-12-28 DIAGNOSIS — I2510 Atherosclerotic heart disease of native coronary artery without angina pectoris: Secondary | ICD-10-CM | POA: Diagnosis not present

## 2018-12-28 NOTE — Patient Instructions (Signed)
Medication Instructions:  NO CHANGE If you need a refill on your cardiac medications before your next appointment, please call your pharmacy.   Lab work: If you have labs (blood work) drawn today and your tests are completely normal, you will receive your results only by: [name] MyChart Message (if you have MyChart) OR . A paper copy in the mail If you have any lab test that is abnormal or we need to change your treatment, we will call you to review the results.  Testing/Procedures: A chest x-ray takes a picture of the organs and structures inside the chest, including the heart, lungs, and blood vessels. This test can show several things, including, whether the heart is enlarges; whether fluid is building up in the lungs; and whether pacemaker / defibrillator leads are still in place. AT [place]: At [facility], you and your health needs are our priority.  As part of our continuing mission to provide you with exceptional heart care, we have created designated Provider Care Teams.  These Care Teams include your primary Cardiologist (physician) and Advanced Practice Providers (APPs -  Physician Assistants and Nurse Practitioners) who all work together to provide you with the care you need, when you need it. You will need a follow up appointment in 6 months.  Please call our office 2 months in advance to schedule this appointment.  You may see Kirk Ruths, MD or one of the following Advanced Practice Providers on your designated Care Team:   Kerin Ransom, PA-C Roby Lofts, [place] . Sande Rives, PA-C  CALL IN [facility] FOR A APPOINTMENT IN [place]

## 2018-12-30 ENCOUNTER — Encounter: Payer: Self-pay | Admitting: *Deleted

## 2019-01-21 NOTE — Progress Notes (Signed)
[facility] Clinic Note - Initial Visit   Date: 01/24/19  LINDELL TUSSEY MRN: [id number] DOB: 04-10-1929   Dear Dr. Regis Bill:  Thank you for your kind referral of KALEENA CORROW for consultation of gait imbalance. Although her history is well known to you, please allow [place] to reiterate it for the purpose of our medical record. The patient was accompanied to the clinic by self.   History of Present Illness: Amy Fischer is a 85 y.o. right-handed Caucasian female with paroxysmal atrial fibrillation (on Eliquis), CHF, GERD, hyperlipidemia, left subclavian steal syndrome, vertigo, and peripheral neuropathy presenting for evaluation of gait imbalance.    Over the past 5 years, she began having unsteadiness when walking and started to use a cane over the past year.  She has suffered several falls over the past few years, some which were mechanical (tripping over oxygen tubing, uneven pavement), others which were not mechanical.  Unfortunately, she has broken her left wrist, left knee, and pelvis with her falls. She is more off balance when walking in the yard, but not as much within her home.  She hand a shower stool for the bathroom, because she gets very unsteady when she closes her eyes.  She does have numbness/tingling of the feet and mid-calf, and feels as if she has cinder blocks on her feet.  Despite this, she remains quite active and lives an independent life.  She lives alone and does her own IADLs and ADLs.  She continues to drive and does not have difficulty manipulating the pedals.  She has contemplated moving into an independent living facility, but her children do not feel strongly about this. She has been on anticoagulation therapy for the past year for PAF.   Recent labs indicate vitamin B12 deficiency and is not taking supplementation.   She was previously evaluated at [facility] neurolog[facility] by Dr. Jacelyn Grip in 2013 for vertigo. CT/A head and neck did not show  evidence of intracranial stenosis.   Out-side paper records, electronic medical record, and images have been reviewed where available and summarized as:  CTA head and neck 01/07/2012:  Mild intracranial atherosclerotic disease without significant stenosis.  Lab Results  Component Value Date   TSH 1.62 11/05/2018   Lab Results  Component Value Date   VITAMINB12 172 (L) 11/05/2018   Lab Results  Component Value Date   HGBA1C 5.1 10/14/2016      Past Medical History:  Diagnosis Date  . Allergy   . Anemia   . Asymptomatic carotid artery stenosis    R ICA 40% stenosis on CTA 12/2011.  . Bladder polyps   . Cataract    BILATERAL-REMOVED  . CHF (congestive heart failure) ([place])   . Diverticulosis   . Elevated blood pressure 03/25/2011   Bp readings borderline today has hx of elvation  In office and ok at home   Not checke recently   She gfeels was elevated from anxiety    . Episodic recurrent vertigo    MRI Head 2003  . Esophageal spasm   . GERD (gastroesophageal reflux disease)   . Hepatic hemangioma   . History of hepatitis    unknown type  . Hyperlipidemia   . Hyperplastic colon polyp   . Hypothyroidism   . Intestinal metaplasia of gastric mucosa   . Osteoarthritis   . Osteoporosis   . Patellar fracture 07/13/2012  . Scapular fracture 03/31/2012   small from direct blow with trip    . Subclavian  steal syndrome    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; CTA 2013 revealed open graft    Past Surgical History:  Procedure Laterality Date  . APPENDECTOMY    . CARDIAC CATHETERIZATION N/A 10/04/2015   Procedure: Right/Left Heart Cath and Coronary Angiography;  Surgeon: Belva Crome, MD;  Location: [facility];  Service: Cardiovascular;  Laterality: N/A;  . CAROTID-SUBCLAVIAN BYPASS GRAFT Left 1999   for [place] steal syndrome  . CHOLECYSTECTOMY    . COLONOSCOPY    . CYSTECTOMY Left    hand  . ELBOW SURGERY Left   . KNEE SURGERY Left 2014  . MITRAL VALVE REPAIR N/A  10/10/2015   Procedure: MITRAL VALVE REPAIR (MVR) WITH SIZE 28 CARPENTIER-EDWARDS PHYSIO II ANNULOPLASTY RING;  Surgeon: [name], MD;  Location: [place];  Service: Open Heart Surgery;  Laterality: N/A;  . ROTATOR CUFF REPAIR Left   . TEAR DUCT PROBING  07/2014  . TEE WITHOUT CARDIOVERSION N/A 10/03/2015   Procedure: TRANSESOPHAGEAL ECHOCARDIOGRAM (TEE);  Surgeon: Larey Dresser, MD;  Location: [place];  Service: Cardiovascular;  Laterality: N/A;  . TEE WITHOUT CARDIOVERSION N/A 10/10/2015   Procedure: TRANSESOPHAGEAL ECHOCARDIOGRAM (TEE);  Surgeon: [name], MD;  Location: [place];  Service: Open Heart Surgery;  Laterality: N/A;  . TONSILLECTOMY    . TUBAL LIGATION       Medications:  Outpatient Encounter Medications as of 01/24/2019  Medication Sig Note  . albuterol (PROVENTIL HFA;VENTOLIN HFA) 108 (90 Base) MCG/ACT inhaler Inhale 2 puffs into the lungs every 6 (six) hours as needed for wheezing or shortness of breath.   [name] amiodarone (PACERONE) 200 MG tablet Take 200 mg by mouth as directed. Take 1/2 tablet by mouth daily   . apixaban (ELIQUIS) 2.5 MG TABS tablet Take 1 tablet (2.5 mg total) by mouth 2 (two) times daily.   . cholecalciferol (VITAMIN D) 1000 units tablet Take 1,000 Units by mouth daily. 08/08/2016: Will bring bottle in for dosing  . DiphenhydrAMINE HCl (BENADRYL PO) Take by mouth.   . ezetimibe (ZETIA) 10 MG tablet TAKE 1 TABLET DAILY   . fish oil-omega-3 fatty acids 1000 MG capsule Take 1 g by mouth daily.    . folic acid (FOLVITE) 1 MG tablet TAKE 1 TABLET DAILY   . glucosamine-chondroitin 500-400 MG tablet Take 1 tablet by mouth daily.    [name] ipratropium (ATROVENT) 0.06 % nasal spray Place 1-2 sprays into both nostrils 2 (two) times daily as needed for rhinitis.   [name] levothyroxine (SYNTHROID, LEVOTHROID) 100 MCG tablet TAKE 1 TABLET EVERY MORNING ON AN EMPTY STOMACH   . loratadine (CLARITIN) 10 MG tablet Take 1 tablet (10 mg total) by mouth daily.  Take one tablet twice daily   . Melatonin 3 MG TABS Take 3 mg by mouth at bedtime.    . MULTIPLE VITAMIN PO Take 1 tablet by mouth daily. 50+ Senior Vitamin Daily   . omeprazole (PRILOSEC) 40 MG capsule TAKE 1 CAPSULE DAILY (Patient taking differently: TAKE 1 CAPSULE DAILY before dinner) 03/18/2017: Taking on occasion when acid reflux flares.   Vladimir Faster Glycol-Propyl Glycol (SYSTANE OP) Place 1 drop into both eyes 2 (two) times daily. Use 1-2 Drops in both eyes   . rosuvastatin (CRESTOR) 5 MG tablet TAKE ONE-HALF (1/2) TABLET DAILY   . SALINE NASAL SPRAY NA Place 1 spray into both nostrils 2 (two) times daily as needed (congestion).    . triamcinolone (NASACORT ALLERGY 24HR) 55 MCG/ACT  AERO nasal inhaler Place 1 spray into the nose daily.   . [DISCONTINUED] Biotin w/ Vitamins C & E (HAIR/SKIN/NAILS PO) Take 1 tablet by mouth daily.   . [DISCONTINUED] vitamin E 400 UNIT capsule Take 400 Units by mouth daily.    No facility-administered encounter medications on file as of 01/24/2019.      Allergies:  Allergies  Allergen Reactions  . Codeine Nausea And Vomiting  . Risedronate Sodium     Upset stomach. Could take Fosamax.  . Statins     Muscles hurt, can take low dose   . Tape Other (See Comments)    Blisters, Please use "paper" tape  . Amoxicillin-Pot Clavulanate Diarrhea    Not allergic  2014, Pt can take z pack only Not allergic  2014, Pt can take z pack only    Family History: Family History  Problem Relation Age of Onset  . Hypertension Mother   . Stroke Mother   . Heart disease Father   . Colon cancer Neg Hx     Social History: Social History   Tobacco Use  . Smoking status: Former Smoker    Packs/day: 0.75    Years: 20.00    Pack years: 15.00    Last attempt to quit: 01/05/1991    Years since quitting: 28.0  . Smokeless tobacco: Never Used  Substance Use Topics  . Alcohol use: Yes    Alcohol/week: 4.0 standard drinks    Types: 4 Glasses of wine per week     Comment: socially  . Drug use: No   Social History   Social History Narrative   Widowed.  Lives alone in a one story home.  Has 4 children (3 living).  Retired first [employer].    HH of 1    No pets   Former smoker   Exercises regularly    Review of Systems:  CONSTITUTIONAL: No fevers, chills, night sweats, or weight loss.   EYES: No visual changes or eye pain ENT: No hearing changes.  No history of nose bleeds.   RESPIRATORY: No cough, wheezing and shortness of breath.   CARDIOVASCULAR: Negative for chest pain, and palpitations.   GI: Negative for abdominal discomfort, blood in stools or black stools.  No recent change in bowel habits.   GU:  No history of incontinence.   MUSCLOSKELETAL: No history of joint pain or swelling.  No myalgias.   SKIN: Negative for lesions, rash, and itching.   HEMATOLOGY/ONCOLOGY: Negative for prolonged bleeding, bruising easily, and swollen nodes.  No history of cancer.   ENDOCRINE: Negative for cold or heat intolerance, polydipsia or goiter.   PSYCH:  No depression or anxiety symptoms.   NEURO: As Above.   Vital Signs:  BP 110/70   Pulse 78   Ht 5\' 1"  (1.549 m)   Wt 123 lb 2 oz (55.8 kg)   SpO2 97%   BMI 23.26 kg/m    General Medical Exam:   General:  Well appearing, comfortable.   Eyes/ENT: see cranial nerve examination.   Neck: No masses appreciated.  Full range of motion without tenderness.  No carotid bruits. Respiratory:  Clear to auscultation, good air entry bilaterally.   Cardiac:  Irregularly irregular rate and rhythm, no murmur.   Extremities:  OA of the hands with mild boney deformities, no edema, or skin discoloration.  Skin:  No rashes or lesions.  Neurological Exam: MENTAL STATUS including orientation to time, place, person, recent and remote memory, attention span and  concentration, language, and fund of knowledge is normal.  Speech is not dysarthric.  CRANIAL NERVES: II:  No visual field defects.  Unremarkable  fundi.   III-IV-VI: Pupils equal round and reactive to light.  Normal conjugate, extra-ocular eye movements in all directions of gaze.  No nystagmus.  No ptosis.   V:  Normal facial sensation.    VII:  Normal facial symmetry and movements.  No pathologic facial reflexes.  VIII:  Normal hearing and vestibular function.   IX-X:  Normal palatal movement.   XI:  Normal shoulder shrug and head rotation.   XII:  Normal tongue strength and range of motion, no deviation or fasciculation.  MOTOR:  No atrophy, fasciculations or abnormal movements.  No pronator drift.  Tone is normal.    Right Upper Extremity:    Left Upper Extremity:    Deltoid  5/5   Deltoid  5/5   Biceps  5/5   Biceps  5/5   Triceps  5/5   Triceps  5/5   Wrist extensors  5/5   Wrist extensors  5/5   Wrist flexors  5/5   Wrist flexors  5/5   Finger extensors  5/5   Finger extensors  5/5   Finger flexors  5/5   Finger flexors  5/5   Dorsal interossei  5/5   Dorsal interossei  5/5   Abductor pollicis  5/5   Abductor pollicis  5/5   Tone (Ashworth scale)  0  Tone (Ashworth scale)  0   Right Lower Extremity:    Left Lower Extremity:    Hip flexors  5/5   Hip flexors  5/5   Hip extensors  5/5   Hip extensors  5/5   Knee flexors  5/5   Knee flexors  5/5   Knee extensors  5/5   Knee extensors  5/5   Dorsiflexors  5/5   Dorsiflexors  5/5   Plantarflexors  5/5   Plantarflexors  5/5   Toe extensors  5/5   Toe extensors  5/5   Toe flexors  5/5   Toe flexors  5/5   Tone (Ashworth scale)  0  Tone (Ashworth scale)  0   MSRs:  Right                                                                 Left brachioradialis 2+  brachioradialis 2+  biceps 2+  biceps 2+  triceps 2+  triceps 2+  patellar 2+  patellar 2+  ankle jerk 0  ankle jerk 0  Hoffman no  Hoffman no  plantar response down  plantar response down   SENSORY:  Vibration is reduced at the knees, absent distal to ankles.  Temperature and pin prick is reduced in a gradient  pattern distal to knees bilaterally.  Proprioception is impaired.  Rhomberg testing is markedly positive.  COORDINATION/GAIT: Normal finger-to- nose-finger.  Intact rapid alternating movements bilaterally. Gait is slow, wide-based, and appears unsteady when unassisted.    IMPRESSION: Gait imbalance due to sensory ataxia from peripheral neuropathy.  She has a long history of neuropathy manifesting with numbness of the feet and imbalance. Exam does not show distal weakness.  I had extensive discussion with the patient regarding the pathogenesis, etiology, management,  and natural course of neuropathy. Neuropathy tends to be slowly progressive, especially if a treatable etiology is not identified.  I would like to test for treatable causes of neuropathy. I discussed that in the vast majority of cases, despite checking for reversible causes, we are unable to find the underlying etiology and management is symptomatic.   Her labs indicate vitamin B12 deficiency and it is not clear if she is taking supplementation.  I will check vitamin B12, folate, and vitamin B1 level. Start physical therapy for balance training Start using a rollator at all times.  With her being on anticoagulation and high risk for falls, it is imperative that fall safety is optimized.  Driving precautions discussed, she denies having problems manipulating pedals. She was informed to be caution and restrict driving as needed She is using a shower chair.  Fall precautions discussed and literature provided Inspect feet daily for any lesions  Return to clinic in 6 months.  Thank you for allowing me to participate in patient's care.  If I can answer any additional questions, I would be pleased to do so.    Sincerely,    Donika K. Posey Pronto, DO

## 2019-01-22 ENCOUNTER — Other Ambulatory Visit: Payer: Self-pay | Admitting: Internal Medicine

## 2019-01-24 ENCOUNTER — Encounter: Payer: Self-pay | Admitting: Neurology

## 2019-01-24 ENCOUNTER — Ambulatory Visit (INDEPENDENT_AMBULATORY_CARE_PROVIDER_SITE_OTHER): Payer: Medicare Other | Admitting: Neurology

## 2019-01-24 ENCOUNTER — Other Ambulatory Visit (INDEPENDENT_AMBULATORY_CARE_PROVIDER_SITE_OTHER): Payer: Medicare Other

## 2019-01-24 ENCOUNTER — Encounter

## 2019-01-24 VITALS — BP 110/70 | HR 78 | Ht 61.0 in | Wt 123.1 lb

## 2019-01-24 DIAGNOSIS — E538 Deficiency of other specified B group vitamins: Secondary | ICD-10-CM

## 2019-01-24 DIAGNOSIS — R278 Other lack of coordination: Secondary | ICD-10-CM | POA: Diagnosis not present

## 2019-01-24 DIAGNOSIS — G629 Polyneuropathy, unspecified: Secondary | ICD-10-CM

## 2019-01-24 DIAGNOSIS — R269 Unspecified abnormalities of gait and mobility: Secondary | ICD-10-CM

## 2019-01-24 LAB — FOLATE: Folate: >24 ng/mL (ref >5.9)

## 2019-01-24 LAB — VITAMIN B12: Vitamin B-12: 459 pg/mL (ref 211–911)

## 2019-01-24 NOTE — Patient Instructions (Addendum)
Check labs  Start physical therapy for balance training  Start using a rollator at all times  Return to clinic 6 months  [facility]  Preventing Falls in the [place] are common, often dreaded events in the lives of older people. Aside from the obvious injuries and even death that may result, falls can cause wide-ranging consequences including loss of independence, mental decline, decreased activity, and mobility. Younger people are also at risk of falling, especially those with chronic illnesses and fatigue.  Ways to reduce the risk for falling:  * Examine diet and medications. Warm foods and alcohol dilate blood vessels, which can lead to dizziness when standing. Sleep aids, antidepressants, and pain medications can also increase the likelihood of a fall.  * Get a vison exam. Poor vision, cataracts, and glaucoma increase the chances of falling.  * Check foot gear. Shoes should fit snugly and have a sturdy, nonskid sole and broad, low heel.  * Participate in a physician-approved exercise program to build and maintain muscle strength and improve balance and coordination.  * Increase vitamin D intake. Vitamin D improves muscle strength and increases the amount of calcium the body is able to absorb and deposit in bones.  How to prevent falls from common hazards:  * Floors - Remove all loose wires, cords, and throw rugs. Minimize clutter. Make sure rugs are anchored and smooth. Keep furniture in its usual place.  * Chairs - Use chairs with straight backs, armrests, and firm seats. Add firm cushions to existing pieces to add height.  * Bathroom - Install grab bars and non-skid tape in the tub or shower. Use a bathtub transfer bench or a shower chair with a back support. Use an elevated toilet seat and/or safety rails to assist standing from a low surface. Do not use towel racks or bathroom tissue holders to help you stand.  * Lighting - Make sure halls, stairways, and entrances are  well-lit. Install a night light in your bathroom or hallway. Make sure there is a light switch at the top and bottom of the staircase. Turn lights on if you get up in the middle of the night. Make sure lamps or light switches are within reach of the bed if you have to get up during the night.  * Kitchen - Install non-skid rubber mats near the sink and stove. Clean spills immediately. Store frequently used utensils, pots, and pans between waist and eye level. This helps prevent reaching and bending. Sit when getting things out of the lower cupboards.  * Living room / Parker furniture with wide spaces in between, giving enough room to move around. Establish a route through the living room that gives you something to hold onto as you walk.  * Stairs - Make sure treads, rails, and rugs are secure. Install a rail on both sides of the stairs. If stairs are a threat, it might be helpful to arrange most of your activities on the lower level to reduce the number of times you must climb the stairs.  * Entrances and doorways - Install metal handles on the walls adjacent to the doorknobs of all doors to make it more secure as you travel through the doorway.  Tips for maintaining balance:  * Keep at least one hand free at all times Try using a backpack or fanny pack to hold things rather than carrying them in your hands. Never carry objects in both hands when walking as this interferes with  keeping your balance.  * Consciously lift your feet off the ground when walking. Shuffling and dragging of the feet is a common culprit in losing your balance.  * When trying to navigate turns, use a "U" technique of facing forward and making a wide turn, rather than pivoting sharply.  * Try to stand with your feet shoulder-length apart. When your feet are close together for any length of time, you increase your risk of losing your balance and falling.  * Do one thing at a time. Do not try to walk and accomplish another  task, such as reading or looking around. The decrease in your automatic reflexes complicates motor function, so the less distraction, the better.  * Do not wear rubber or gripping soled shoes, they might "catch" on the floor and cause tripping.  * Move slowly when changing positions. Use deliberate, concentrated movements and, if needed, use a grab bar or walking aid. Count fifteen (15) seconds after standing to begin walking.  * If balance is a continuous problem, you might want to consider a walking aid such as a cane, walking stick, or walker. Once you have mastered walking with help, you may be ready to try it again on your own.  This information is provided by [facility] and is not intended to replace the medical advice of your physician or other health care providers. Please consult your physician or other health care providers for advice regarding your specific medical condition.

## 2019-01-25 DIAGNOSIS — L821 Other seborrheic keratosis: Secondary | ICD-10-CM | POA: Diagnosis not present

## 2019-01-25 DIAGNOSIS — L738 Other specified follicular disorders: Secondary | ICD-10-CM | POA: Diagnosis not present

## 2019-01-25 DIAGNOSIS — L570 Actinic keratosis: Secondary | ICD-10-CM | POA: Diagnosis not present

## 2019-01-25 DIAGNOSIS — Z85828 Personal history of other malignant neoplasm of skin: Secondary | ICD-10-CM | POA: Diagnosis not present

## 2019-01-25 DIAGNOSIS — L812 Freckles: Secondary | ICD-10-CM | POA: Diagnosis not present

## 2019-01-26 ENCOUNTER — Telehealth: Payer: Self-pay | Admitting: *Deleted

## 2019-01-26 NOTE — Telephone Encounter (Signed)
Appointment at [facility] on 02/01/2019 at 2:00.

## 2019-01-28 LAB — VITAMIN B1: Vitamin B1 (Thiamine): 32 nmol/L — ABNORMAL HIGH (ref 8–30)

## 2019-02-01 DIAGNOSIS — R2681 Unsteadiness on feet: Secondary | ICD-10-CM | POA: Diagnosis not present

## 2019-02-01 DIAGNOSIS — R260 Ataxic gait: Secondary | ICD-10-CM | POA: Diagnosis not present

## 2019-02-04 ENCOUNTER — Telehealth: Payer: Self-pay | Admitting: *Deleted

## 2019-02-04 NOTE — Telephone Encounter (Signed)
Noted.  Prolia injection has been labelled for patient.

## 2019-02-04 NOTE — Telephone Encounter (Signed)
Patient notified of message- she does have an appointment on 02/08/19 and she wants to get her injection at that appointment.

## 2019-02-04 NOTE — Telephone Encounter (Signed)
Left message on machine for patient If there are no changes to her insurance and her deductible has been met,  Her cost for Prolia injection is approximately $10  Injection due after 02/03/2019  CRM

## 2019-02-07 NOTE — Progress Notes (Signed)
Chief Complaint  Patient presents with  . Follow-up    Pt has no concerns today     HPI: Amy Fischer 85 y.o. come in for Chronic disease management   On prolia for osteoporosis  Due for injection  Been given dx of neuropathy and lwo bqw  Dr Posey Pronto  Advised walker  Instead of cane .  Going to  Therapy  Is struggling witht he fact that she can  No longer take her 2 mile walks (sob and ms)  Per Dr Stanford Breed   No new    Sx   taking b12  dialy last labs per dr Posey Pronto.    ROS: See pertinent positives and negatives per HPI.  Past Medical History:  Diagnosis Date  . Allergy   . Anemia   . Asymptomatic carotid artery stenosis    R ICA 40% stenosis on CTA 12/2011.  . Bladder polyps   . Cataract    BILATERAL-REMOVED  . CHF (congestive heart failure) ([place])   . Diverticulosis   . Elevated blood pressure 03/25/2011   Bp readings borderline today has hx of elvation  In office and ok at home   Not checke recently   She gfeels was elevated from anxiety    . Episodic recurrent vertigo    MRI Head 2003  . Esophageal spasm   . GERD (gastroesophageal reflux disease)   . Hepatic hemangioma   . History of hepatitis    unknown type  . Hyperlipidemia   . Hyperplastic colon polyp   . Hypothyroidism   . Intestinal metaplasia of gastric mucosa   . Osteoarthritis   . Osteoporosis   . Patellar fracture 07/13/2012  . Scapular fracture 03/31/2012   small from direct blow with trip    . Subclavian steal syndrome    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; CTA 2013 revealed open graft    Family History  Problem Relation Age of Onset  . Hypertension Mother   . Stroke Mother   . Heart disease Father   . Colon cancer Neg Hx     Social History   Socioeconomic History  . Marital status: Widowed    Spouse name: Not on file  . Number of children: 4  . Years of education: Not on file  . Highest education level: Bachelor's degree (e.g., BA, AB, BS)  Occupational History  . Occupation:  retired Tour manager  . Financial resource strain: Not on file  . Food insecurity:    Worry: Not on file    Inability: Not on file  . Transportation needs:    Medical: Not on file    Non-medical: Not on file  Tobacco Use  . Smoking status: Former Smoker    Packs/day: 0.75    Years: 20.00    Pack years: 15.00    Last attempt to quit: 01/05/1991    Years since quitting: 28.1  . Smokeless tobacco: Never Used  Substance and Sexual Activity  . Alcohol use: Yes    Alcohol/week: 4.0 standard drinks    Types: 4 Glasses of wine per week    Comment: socially  . Drug use: No  . Sexual activity: Not on file  Lifestyle  . Physical activity:    Days per week: Not on file    Minutes per session: Not on file  . Stress: Not on file  Relationships  . Social connections:    Talks on phone: Not on file  Gets together: Not on file    Attends religious service: Not on file    Active member of club or organization: Not on file    Attends meetings of clubs or organizations: Not on file    Relationship status: Not on file  Other Topics Concern  . Not on file  Social History Narrative   Widowed.  Lives alone in a one story home.  Has 4 children (3 living).  Retired first [employer].    HH of 1    No pets   Former smoker   Exercises regularly    Outpatient Medications Prior to Visit  Medication Sig Dispense Refill  . albuterol (PROVENTIL HFA;VENTOLIN HFA) 108 (90 Base) MCG/ACT inhaler Inhale 2 puffs into the lungs every 6 (six) hours as needed for wheezing or shortness of breath. 1 Inhaler 1  . amiodarone (PACERONE) 200 MG tablet Take 200 mg by mouth as directed. Take 1/2 tablet by mouth daily    . apixaban (ELIQUIS) 2.5 MG TABS tablet Take 1 tablet (2.5 mg total) by mouth 2 (two) times daily. 180 tablet 3  . cholecalciferol (VITAMIN D) 1000 units tablet Take 1,000 Units by mouth daily.    . DiphenhydrAMINE HCl (BENADRYL PO) Take by mouth.    . ezetimibe (ZETIA) 10 MG tablet  TAKE 1 TABLET DAILY 90 tablet 1  . fish oil-omega-3 fatty acids 1000 MG capsule Take 1 g by mouth daily.     . folic acid (FOLVITE) 1 MG tablet TAKE 1 TABLET DAILY 90 tablet 1  . glucosamine-chondroitin 500-400 MG tablet Take 1 tablet by mouth daily.     [name] ipratropium (ATROVENT) 0.06 % nasal spray Place 1-2 sprays into both nostrils 2 (two) times daily as needed for rhinitis. 45 mL 3  . levothyroxine (SYNTHROID, LEVOTHROID) 100 MCG tablet TAKE 1 TABLET EVERY MORNING ON AN EMPTY STOMACH 90 tablet 1  . loratadine (CLARITIN) 10 MG tablet Take 1 tablet (10 mg total) by mouth daily. Take one tablet twice daily 60 tablet 5  . Melatonin 3 MG TABS Take 3 mg by mouth at bedtime.     . MULTIPLE VITAMIN PO Take 1 tablet by mouth daily. 50+ Senior Vitamin Daily    . omeprazole (PRILOSEC) 40 MG capsule TAKE 1 CAPSULE DAILY (Patient taking differently: TAKE 1 CAPSULE DAILY before dinner) 90 capsule 1  . Polyethyl Glycol-Propyl Glycol (SYSTANE OP) Place 1 drop into both eyes 2 (two) times daily. Use 1-2 Drops in both eyes    . rosuvastatin (CRESTOR) 5 MG tablet TAKE ONE-HALF (1/2) TABLET DAILY 90 tablet 4  . SALINE NASAL SPRAY NA Place 1 spray into both nostrils 2 (two) times daily as needed (congestion).     . triamcinolone (NASACORT ALLERGY 24HR) 55 MCG/ACT AERO nasal inhaler Place 1 spray into the nose daily.    . vitamin B-12 (CYANOCOBALAMIN) 1000 MCG tablet Take 1,000 mcg by mouth daily.     No facility-administered medications prior to visit.      EXAM:  BP 122/70 (BP Location: Right Arm, Patient Position: Sitting, Cuff Size: Normal)   Pulse 76   Temp (!) 97.5 F (36.4 C) (Oral)   Wt 123 lb 3.2 oz (55.9 kg)   BMI 23.28 kg/m   Body mass index is 23.28 kg/m.  GENERAL: vitals reviewed and listed above, alert, oriented, appears well hydrated and in no acute distress HEENT: atraumatic, conjunctiva  clear, no obvious abnormalities on inspection of external nose and ears  NECK: no  obvious masses  on inspection palpation  LUNGS: clear to auscultation bilaterally, no wheezes, rales or rhonchi, good air movement CV: HRRR, no clubbing cyanosis or  peripheral edema nl cap refill  MS: moves all extremities djd changes   Walking up and from chair  Balances  But cautious turning   independent  Has cane  PSYCH: pleasant and cooperative, no obvious depression or anxiety Lab Results  Component Value Date   WBC 4.2 11/05/2018   HGB 14.3 11/05/2018   HCT 41.9 11/05/2018   PLT 192.0 11/05/2018   GLUCOSE 83 11/05/2018   CHOL 157 08/03/2018   TRIG 131.0 08/03/2018   HDL 65.20 08/03/2018   LDLCALC 66 08/03/2018   ALT 15 11/05/2018   AST 19 11/05/2018   NA 141 11/05/2018   K 4.6 11/05/2018   CL 106 11/05/2018   CREATININE 0.82 11/05/2018   BUN 18 11/05/2018   CO2 29 11/05/2018   TSH 1.62 11/05/2018   INR 1.02 01/12/2016   HGBA1C 5.1 10/14/2016   BP Readings from Last 3 Encounters:  02/08/19 122/70  01/24/19 110/70  12/28/18 126/78    ASSESSMENT AND PLAN:  Discussed the following assessment and plan:  Osteoporosis, unspecified osteoporosis type, unspecified pathological fracture presence - Plan: denosumab (PROLIA) injection 60 mg  Medication management  Low serum vitamin B12 - improved and stay on supplmentation   Neuropathy  Balance problem Counseled. Fall prevention and  Adaptations  As difficult as it is   Stay with therapy   Plan rov for yearly in about 6 months or as needed  -Patient advised to return or notify health care team  if  new concerns arise.  Patient Instructions  Keep with therapy   ROV in 6 months     [name]. Larenda Reedy M.D.

## 2019-02-08 ENCOUNTER — Ambulatory Visit (INDEPENDENT_AMBULATORY_CARE_PROVIDER_SITE_OTHER): Payer: Medicare Other | Admitting: Internal Medicine

## 2019-02-08 ENCOUNTER — Encounter: Payer: Self-pay | Admitting: Internal Medicine

## 2019-02-08 VITALS — BP 122/70 | HR 76 | Temp 97.5°F | Wt 123.2 lb

## 2019-02-08 DIAGNOSIS — Z79899 Other long term (current) drug therapy: Secondary | ICD-10-CM

## 2019-02-08 DIAGNOSIS — R260 Ataxic gait: Secondary | ICD-10-CM | POA: Diagnosis not present

## 2019-02-08 DIAGNOSIS — R2689 Other abnormalities of gait and mobility: Secondary | ICD-10-CM | POA: Diagnosis not present

## 2019-02-08 DIAGNOSIS — M810 Age-related osteoporosis without current pathological fracture: Secondary | ICD-10-CM

## 2019-02-08 DIAGNOSIS — R2681 Unsteadiness on feet: Secondary | ICD-10-CM | POA: Diagnosis not present

## 2019-02-08 DIAGNOSIS — G629 Polyneuropathy, unspecified: Secondary | ICD-10-CM

## 2019-02-08 DIAGNOSIS — E538 Deficiency of other specified B group vitamins: Secondary | ICD-10-CM

## 2019-02-08 MED ORDER — DENOSUMAB 60 MG/ML ~~LOC~~ SOSY
60.0000 mg | PREFILLED_SYRINGE | Freq: Once | SUBCUTANEOUS | Status: AC
  Administered 2019-02-08: 60 mg via SUBCUTANEOUS

## 2019-02-08 NOTE — Patient Instructions (Signed)
Keep with therapy   ROV in 6 months

## 2019-02-10 DIAGNOSIS — R2681 Unsteadiness on feet: Secondary | ICD-10-CM | POA: Diagnosis not present

## 2019-02-10 DIAGNOSIS — R260 Ataxic gait: Secondary | ICD-10-CM | POA: Diagnosis not present

## 2019-02-14 ENCOUNTER — Telehealth: Payer: Self-pay | Admitting: *Deleted

## 2019-02-14 NOTE — Telephone Encounter (Signed)
   [facility] Pre-operative Risk Assessment    Request for surgical clearance:  1. What type of surgery is being performed? LEFT UPPER AND LOWER PLASTIC REPAIR OF CANALICULUS WITH NASAL ENDOSCOPY AND LEFT LOWER EYELID ENTROPION AND TRICHIASIS REPAIR    2. When is this surgery scheduled? 02/28/19      3. What type of clearance is required (medical clearance vs. Pharmacy clearance to hold med vs. Both)? BOTH  4. Are there any medications that need to be held prior to surgery and how long?ELIQUIS    5. Practice name and name of physician performing surgery? [facility]    6. What is your office phone number? [phone]    7.   What is your office fax number? [phone]  8.   Anesthesia type (None, local, MAC, general) ? GENERAL

## 2019-02-15 DIAGNOSIS — R2681 Unsteadiness on feet: Secondary | ICD-10-CM | POA: Diagnosis not present

## 2019-02-15 DIAGNOSIS — R260 Ataxic gait: Secondary | ICD-10-CM | POA: Diagnosis not present

## 2019-02-21 ENCOUNTER — Other Ambulatory Visit: Payer: Self-pay | Admitting: Physician Assistant

## 2019-02-23 NOTE — Telephone Encounter (Signed)
Surgeon's office called today to check on status of clearance.

## 2019-02-23 NOTE — Telephone Encounter (Signed)
   Primary Cardiologist: Kirk Ruths, MD  Chart reviewed as part of pre-operative protocol coverage. Patient was contacted 02/23/2019 in reference to pre-operative risk assessment for pending surgery as outlined below.  Korissa Horsford Squitieri was last seen on 12/28/18 by Dr. Stanford Breed  Since that day, MAGALIE ALMON has done well w/o any cardiac symptoms. No CP or dyspnea.   Therefore, based on ACC/AHA guidelines, the patient would be at acceptable risk for the planned procedure without further cardiovascular testing.   Per pharmacy recommendations, patient can hold Eliquis for 1-2 days prior to procedure  I will route this recommendation to the requesting party via Epic fax function and remove from pre-op pool.  Please call with questions.  Lyda Jester, PA-C 02/23/2019, 4:37 PM

## 2019-02-23 NOTE — Telephone Encounter (Signed)
Patient with diagnosis of Afib on Eliquis for anticoagulation.    Procedure: canaliculus with nasal endoscopy and lower eye lid entropion and trichiasis repair Date of procedure: 02/28/19  CHADS2-VASc score of  6 (CHF, HTN, AGE, DM2, stroke/tia x 2, CAD, AGE, female)  CrCl 73ml/min  Per office protocol, patient can hold Eliquis for 1-2 days prior to procedure.

## 2019-02-24 ENCOUNTER — Telehealth: Payer: Self-pay | Admitting: Cardiology

## 2019-02-24 DIAGNOSIS — R2681 Unsteadiness on feet: Secondary | ICD-10-CM | POA: Diagnosis not present

## 2019-02-24 DIAGNOSIS — R260 Ataxic gait: Secondary | ICD-10-CM | POA: Diagnosis not present

## 2019-02-24 NOTE — Telephone Encounter (Signed)
Follow Up:      Jenna from Dr Linton Rump office is calling to check on the status of pt's clearance. Please call or fax clearance to [phone].

## 2019-02-28 ENCOUNTER — Other Ambulatory Visit: Payer: Self-pay | Admitting: Ophthalmology

## 2019-02-28 DIAGNOSIS — H04221 Epiphora due to insufficient drainage, right lacrimal gland: Secondary | ICD-10-CM | POA: Diagnosis not present

## 2019-02-28 DIAGNOSIS — H04411 Chronic dacryocystitis of right lacrimal passage: Secondary | ICD-10-CM | POA: Diagnosis not present

## 2019-02-28 DIAGNOSIS — H04222 Epiphora due to insufficient drainage, left lacrimal gland: Secondary | ICD-10-CM | POA: Diagnosis not present

## 2019-02-28 DIAGNOSIS — H04412 Chronic dacryocystitis of left lacrimal passage: Secondary | ICD-10-CM | POA: Diagnosis not present

## 2019-02-28 DIAGNOSIS — H04542 Stenosis of left lacrimal canaliculi: Secondary | ICD-10-CM | POA: Diagnosis not present

## 2019-02-28 DIAGNOSIS — H02035 Senile entropion of left lower eyelid: Secondary | ICD-10-CM | POA: Diagnosis not present

## 2019-02-28 DIAGNOSIS — H04552 Acquired stenosis of left nasolacrimal duct: Secondary | ICD-10-CM | POA: Diagnosis not present

## 2019-02-28 DIAGNOSIS — H02055 Trichiasis without entropian left lower eyelid: Secondary | ICD-10-CM | POA: Diagnosis not present

## 2019-02-28 DIAGNOSIS — H04223 Epiphora due to insufficient drainage, bilateral lacrimal glands: Secondary | ICD-10-CM | POA: Diagnosis not present

## 2019-03-01 ENCOUNTER — Telehealth: Payer: Self-pay | Admitting: Cardiology

## 2019-03-01 NOTE — Telephone Encounter (Signed)
From a cardiovascular perspective would want her back on Eliquis as soon as safe. Generally we recommend resuming anticoagulation the day following procedure. With her the bleeding she will need to contact the provider who performed surgery for when safe to resume, but ideally as soon as safe and less than 1 week.

## 2019-03-01 NOTE — Telephone Encounter (Signed)
Returned call to patient's daughter [name].She stated mother had surgery yesterday for inverted eye lashes and a stent put in tear duct.Stated she has been off Eliquis since Saturday and on discharge instructions it said to remain off Eliquis for 1 week but, the nurse who discharged them said to restart Eliquis today.Stated mother has been having a nose bleed this morning she is afraid to restart today.Advised I will send message to our pharmacist for advice.

## 2019-03-01 NOTE — Telephone Encounter (Signed)
Returned call to patient's daughter Judene Companion recommendation given.Stated she has a call into Psychologist, sport and exercise now.

## 2019-03-01 NOTE — Telephone Encounter (Signed)
New Message:    Patient daughter calling concerning patient medications. Please call daughter.

## 2019-03-03 DIAGNOSIS — Z09 Encounter for follow-up examination after completed treatment for conditions other than malignant neoplasm: Secondary | ICD-10-CM | POA: Diagnosis not present

## 2019-03-03 DIAGNOSIS — H04222 Epiphora due to insufficient drainage, left lacrimal gland: Secondary | ICD-10-CM | POA: Diagnosis not present

## 2019-03-03 DIAGNOSIS — H04552 Acquired stenosis of left nasolacrimal duct: Secondary | ICD-10-CM | POA: Diagnosis not present

## 2019-03-03 DIAGNOSIS — H04542 Stenosis of left lacrimal canaliculi: Secondary | ICD-10-CM | POA: Diagnosis not present

## 2019-03-03 DIAGNOSIS — R040 Epistaxis: Secondary | ICD-10-CM | POA: Diagnosis not present

## 2019-03-03 DIAGNOSIS — H04412 Chronic dacryocystitis of left lacrimal passage: Secondary | ICD-10-CM | POA: Diagnosis not present

## 2019-03-08 DIAGNOSIS — R2681 Unsteadiness on feet: Secondary | ICD-10-CM | POA: Diagnosis not present

## 2019-03-08 DIAGNOSIS — R260 Ataxic gait: Secondary | ICD-10-CM | POA: Diagnosis not present

## 2019-03-10 DIAGNOSIS — R2681 Unsteadiness on feet: Secondary | ICD-10-CM | POA: Diagnosis not present

## 2019-03-10 DIAGNOSIS — R260 Ataxic gait: Secondary | ICD-10-CM | POA: Diagnosis not present

## 2019-03-16 ENCOUNTER — Other Ambulatory Visit: Payer: Self-pay | Admitting: Internal Medicine

## 2019-03-21 ENCOUNTER — Other Ambulatory Visit: Payer: Self-pay | Admitting: Internal Medicine

## 2019-04-03 ENCOUNTER — Other Ambulatory Visit: Payer: Self-pay | Admitting: Physician Assistant

## 2019-04-04 NOTE — Telephone Encounter (Signed)
Refilled amiodarone 

## 2019-06-01 DIAGNOSIS — H04552 Acquired stenosis of left nasolacrimal duct: Secondary | ICD-10-CM | POA: Diagnosis not present

## 2019-06-01 DIAGNOSIS — Z09 Encounter for follow-up examination after completed treatment for conditions other than malignant neoplasm: Secondary | ICD-10-CM | POA: Diagnosis not present

## 2019-06-01 DIAGNOSIS — H04222 Epiphora due to insufficient drainage, left lacrimal gland: Secondary | ICD-10-CM | POA: Diagnosis not present

## 2019-06-01 DIAGNOSIS — H02035 Senile entropion of left lower eyelid: Secondary | ICD-10-CM | POA: Diagnosis not present

## 2019-07-12 NOTE — Progress Notes (Signed)
Virtual Visit via Video Note changed to phone visit at patient request.   This visit type was conducted due to national recommendations for restrictions regarding the COVID-19 Pandemic (e.g. social distancing) in an effort to limit this patient's exposure and mitigate transmission in our community.  Due to her co-morbid illnesses, this patient is at least at moderate risk for complications without adequate follow up.  This format is felt to be most appropriate for this patient at this time.  All issues noted in this document were discussed and addressed.  A limited physical exam was performed with this format.  Please refer to the patient's chart for her consent to telehealth for [facility].   Date:  07/18/2019   ID:  Amy Fischer, DOB 17-Aug-1929, MRN [id number]  Patient Location:Home Provider Location: Home  PCP:  Burnis Medin, MD  Cardiologist:  Dr Stanford Breed  Evaluation Performed:  Follow-Up Visit  Chief Complaint:  FU MV repair  History of Present Illness:    Follow-up mitral valve repair. Patient was in the hospital in October 2016 with congestive heart failure. Transesophageal echocardiogram showed normal LV function. There was a flail posterior leaflet with severe mitral regurgitation. There was mild to moderate tricuspid regurgitation. Cardiac catheterization October 2016 showed a 30% LAD, 60% first diagonal and 30% right coronary artery. Ejection fraction 60%. Patient subsequent had mitral valve repair. Note preoperative carotid Dopplers showed 1-39% stenosis. Note the patient had atrial fibrillation after surgery.Follow-up monitor March 2019 showed sinus rhythm with PVCs and paroxysmal atrial flutter/fibrillation. Last echocardiogram April 2019 showed normal LV function, moderate left ventricular hypertrophy, prior mitral valve repair and mild tricuspid regurgitation. Patient placed on amiodarone andapixaban. Since last seen, the patient has dyspnea with more extreme  activities but not with routine activities. It is relieved with rest. It is not associated with chest pain. There is no orthopnea, PND or pedal edema. There is no syncope or palpitations. There is no exertional chest pain.   The patient does not have symptoms concerning for COVID-19 infection (fever, chills, cough, or new shortness of breath).    Past Medical History:  Diagnosis Date   Allergy    Anemia    Asymptomatic carotid artery stenosis    R ICA 40% stenosis on CTA 12/2011.   Bladder polyps    Cataract    BILATERAL-REMOVED   CHF (congestive heart failure) ([facility])    Diverticulosis    Elevated blood pressure 03/25/2011   Bp readings borderline today has hx of elvation  In office and ok at home   Not checke recently   She gfeels was elevated from anxiety     Episodic recurrent vertigo    MRI Head 2003   Esophageal spasm    GERD (gastroesophageal reflux disease)    Hepatic hemangioma    History of hepatitis    unknown type   Hyperlipidemia    Hyperplastic colon polyp    Hypothyroidism    Intestinal metaplasia of gastric mucosa    Osteoarthritis    Osteoporosis    Patellar fracture 07/13/2012   Scapular fracture 03/31/2012   small from direct blow with trip     Subclavian steal syndrome    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; CTA 2013 revealed open graft   Past Surgical History:  Procedure Laterality Date   APPENDECTOMY     CARDIAC CATHETERIZATION N/A 10/04/2015   Procedure: Right/Left Heart Cath and Coronary Angiography;  Surgeon: Belva Crome, MD;  Location: [facility]  INVASIVE CV LAB;  Service: Cardiovascular;  Laterality: N/A;   CAROTID-SUBCLAVIAN BYPASS GRAFT Left 1999   for Woodson steal syndrome   CHOLECYSTECTOMY     COLONOSCOPY     CYSTECTOMY Left    hand   ELBOW SURGERY Left    KNEE SURGERY Left 2014   MITRAL VALVE REPAIR N/A 10/10/2015   Procedure: MITRAL VALVE REPAIR (MVR) WITH SIZE 28 CARPENTIER-EDWARDS PHYSIO II ANNULOPLASTY RING;   Surgeon: [name], MD;  Location: [place];  Service: Open Heart Surgery;  Laterality: N/A;   ROTATOR CUFF REPAIR Left    TEAR DUCT PROBING  07/2014   TEE WITHOUT CARDIOVERSION N/A 10/03/2015   Procedure: TRANSESOPHAGEAL ECHOCARDIOGRAM (TEE);  Surgeon: Larey Dresser, MD;  Location: [place];  Service: Cardiovascular;  Laterality: N/A;   TEE WITHOUT CARDIOVERSION N/A 10/10/2015   Procedure: TRANSESOPHAGEAL ECHOCARDIOGRAM (TEE);  Surgeon: [name], MD;  Location: [place];  Service: Open Heart Surgery;  Laterality: N/A;   TONSILLECTOMY     TUBAL LIGATION       Current Meds  Medication Sig   albuterol (PROVENTIL HFA;VENTOLIN HFA) 108 (90 Base) MCG/ACT inhaler Inhale 2 puffs into the lungs every 6 (six) hours as needed for wheezing or shortness of breath.   amiodarone (PACERONE) 200 MG tablet TAKE 1 TABLET DAILY   cholecalciferol (VITAMIN D) 1000 units tablet Take 1,000 Units by mouth daily.   DiphenhydrAMINE HCl (BENADRYL PO) Take by mouth.   ELIQUIS 2.5 MG TABS tablet TAKE 1 TABLET TWICE A DAY   ezetimibe (ZETIA) 10 MG tablet TAKE 1 TABLET DAILY   fish oil-omega-3 fatty acids 1000 MG capsule Take 1 g by mouth daily.    folic acid (FOLVITE) 1 MG tablet TAKE 1 TABLET DAILY   glucosamine-chondroitin 500-400 MG tablet Take 1 tablet by mouth daily.    ipratropium (ATROVENT) 0.06 % nasal spray Place 1-2 sprays into both nostrils 2 (two) times daily as needed for rhinitis.   levothyroxine (SYNTHROID, LEVOTHROID) 100 MCG tablet TAKE 1 TABLET EVERY MORNING ON AN EMPTY STOMACH   loratadine (CLARITIN) 10 MG tablet Take 1 tablet (10 mg total) by mouth daily. Take one tablet twice daily   Melatonin 3 MG TABS Take 3 mg by mouth at bedtime.    MULTIPLE VITAMIN PO Take 1 tablet by mouth daily. 50+ Senior Vitamin Daily   omeprazole (PRILOSEC) 40 MG capsule TAKE 1 CAPSULE DAILY (Patient taking differently: TAKE 1 CAPSULE DAILY before dinner)   Polyethyl  Glycol-Propyl Glycol (SYSTANE OP) Place 1 drop into both eyes 2 (two) times daily. Use 1-2 Drops in both eyes   rosuvastatin (CRESTOR) 5 MG tablet TAKE ONE-HALF (1/2) TABLET DAILY   SALINE NASAL SPRAY NA Place 1 spray into both nostrils 2 (two) times daily as needed (congestion).    triamcinolone (NASACORT ALLERGY 24HR) 55 MCG/ACT AERO nasal inhaler Place 1 spray into the nose daily.   vitamin B-12 (CYANOCOBALAMIN) 1000 MCG tablet Take 1,000 mcg by mouth daily.     Allergies:   Codeine, Risedronate sodium, Statins, Tape, and Amoxicillin-pot clavulanate   Social History   Tobacco Use   Smoking status: Former Smoker    Packs/day: 0.75    Years: 20.00    Pack years: 15.00    Quit date: 01/05/1991    Years since quitting: 28.5   Smokeless tobacco: Never Used  Substance Use Topics   Alcohol use: Yes    Alcohol/week: 4.0 standard drinks    Types: 4 Glasses of wine  per week    Comment: socially   Drug use: No     Family Hx: The patient's family history includes Heart disease in her father; Hypertension in her mother; Stroke in her mother. There is no history of Colon cancer.  ROS:   Please see the history of present illness.    No Fever, chills  or productive cough All other systems reviewed and are negative.  Recent Labs: 11/05/2018: ALT 15; BUN 18; Creatinine, Ser 0.82; Hemoglobin 14.3; Platelets 192.0; Potassium 4.6; Pro B Natriuretic peptide (BNP) 144.0; Sodium 141; TSH 1.62   Recent Lipid Panel Lab Results  Component Value Date/Time   CHOL 157 08/03/2018 10:47 AM   TRIG 131.0 08/03/2018 10:47 AM   HDL 65.20 08/03/2018 10:47 AM   CHOLHDL 2 08/03/2018 10:47 AM   LDLCALC 66 08/03/2018 10:47 AM    Wt Readings from Last 3 Encounters:  07/18/19 122 lb 12.8 oz (55.7 kg)  02/08/19 123 lb 3.2 oz (55.9 kg)  01/24/19 123 lb 2 oz (55.8 kg)     Objective:    Vital Signs:  BP 139/63    Pulse 88    Ht 5\' 2"  (1.575 m)    Wt 122 lb 12.8 oz (55.7 kg)    BMI 22.46 kg/m     VITAL SIGNS:  reviewed NAD Answers questions appropriately Normal affect Remainder of physical examination not performed (telehealth visit; coronavirus pandemic)  ASSESSMENT & PLAN:    1. Prior mitral valve repair-continue SBE prophylaxis. 2. Paroxysmal atrial fibrillation-patient is in sinus rhythm by history.  Continue amiodarone and apixaban.  Check chest x-ray, TSH, liver functions.  Check hemoglobin and renal function. 3. Coronary artery disease-continue statin.  She is not on aspirin given need for anticoagulation.  She is not having chest pain. 4. Hypertension-patient's blood pressure is controlled.  Continue present medications. 5. Hyperlipidemia-continue statin.  Check lipids.  COVID-19 Education: The importance of social distancing was discussed today.  Time:   Today, I have spent 17 minutes with the patient with telehealth technology discussing the above problems.     Medication Adjustments/Labs and Tests Ordered: Current medicines are reviewed at length with the patient today.  Concerns regarding medicines are outlined above.   Tests Ordered: No orders of the defined types were placed in this encounter.   Medication Changes: No orders of the defined types were placed in this encounter.   Follow Up:  Virtual Visit or In Person in 6 month(s)  Signed, Kirk Ruths, MD  07/18/2019 9:53 AM    [place]

## 2019-07-18 ENCOUNTER — Telehealth (INDEPENDENT_AMBULATORY_CARE_PROVIDER_SITE_OTHER): Payer: Medicare Other | Admitting: Cardiology

## 2019-07-18 ENCOUNTER — Encounter: Payer: Self-pay | Admitting: Cardiology

## 2019-07-18 VITALS — BP 139/63 | HR 88 | Ht 62.0 in | Wt 122.8 lb

## 2019-07-18 DIAGNOSIS — I480 Paroxysmal atrial fibrillation: Secondary | ICD-10-CM

## 2019-07-18 DIAGNOSIS — I2510 Atherosclerotic heart disease of native coronary artery without angina pectoris: Secondary | ICD-10-CM | POA: Diagnosis not present

## 2019-07-18 DIAGNOSIS — I10 Essential (primary) hypertension: Secondary | ICD-10-CM | POA: Diagnosis not present

## 2019-07-18 DIAGNOSIS — E7800 Pure hypercholesterolemia, unspecified: Secondary | ICD-10-CM | POA: Diagnosis not present

## 2019-07-18 NOTE — Patient Instructions (Signed)
Medication Instructions:  NO CHANGE If you need a refill on your cardiac medications before your next appointment, please call your pharmacy.   Lab work: Your physician recommends that you return for lab work WHEN CONVENIENT If you have labs (blood work) drawn today and your tests are completely normal, you will receive your results only by: [name] MyChart Message (if you have MyChart) OR . A paper copy in the mail If you have any lab test that is abnormal or we need to change your treatment, we will call you to review the results.  Testing/Procedures: A chest x-ray takes a picture of the organs and structures inside the chest, including the heart, lungs, and blood vessels. This test can show several things, including, whether the heart is enlarges; whether fluid is building up in the lungs; and whether pacemaker / defibrillator leads are still in place. [address] [facility]  Follow-Up: At [facility], you and your health needs are our priority.  As part of our continuing mission to provide you with exceptional heart care, we have created designated Provider Care Teams.  These Care Teams include your primary Cardiologist (physician) and Advanced Practice Providers (APPs -  Physician Assistants and Nurse Practitioners) who all work together to provide you with the care you need, when you need it. You will need a follow up appointment in 6 months.  Please call our office 2 months in advance to schedule this appointment.  You may see Kirk Ruths, MD or one of the following Advanced Practice Providers on your designated Care Team:   Kerin Ransom, PA-C Roby Lofts, [place] . Sande Rives, PA-C

## 2019-07-21 ENCOUNTER — Encounter: Payer: Self-pay | Admitting: Neurology

## 2019-07-25 ENCOUNTER — Encounter: Payer: Self-pay | Admitting: Neurology

## 2019-07-25 ENCOUNTER — Ambulatory Visit (INDEPENDENT_AMBULATORY_CARE_PROVIDER_SITE_OTHER): Payer: Medicare Other | Admitting: Neurology

## 2019-07-25 ENCOUNTER — Other Ambulatory Visit: Payer: Self-pay

## 2019-07-25 VITALS — BP 110/62 | HR 74 | Ht 62.0 in | Wt 120.0 lb

## 2019-07-25 DIAGNOSIS — G629 Polyneuropathy, unspecified: Secondary | ICD-10-CM

## 2019-07-25 DIAGNOSIS — I2510 Atherosclerotic heart disease of native coronary artery without angina pectoris: Secondary | ICD-10-CM | POA: Diagnosis not present

## 2019-07-25 NOTE — Progress Notes (Signed)
Follow-up Visit   Date: 07/25/19   BAYLYN SICKLES MRN: [id number] DOB: 10/11/1929   Interim History: Amy Fischer is a 85 y.o. right-handed Caucasian female with paroxysmal atrial fibrillation (on Eliquis), CHF, GERD, hyperlipidemia, left subclavian steal syndrome, and vertigo returning to the clinic for follow-up of peripheral neuropathy.  The patient was accompanied to the clinic by self.   History of present illness: Over the past 5 years, she began having unsteadiness when walking and started to use a cane over the past year.  She has suffered several falls over the past few years, some which were mechanical (tripping over oxygen tubing, uneven pavement), others which were not mechanical.  Unfortunately, she has broken her left wrist, left knee, and pelvis with her falls. She is more off balance when walking in the yard, but not as much within her home.  She hand a shower stool for the bathroom, because she gets very unsteady when she closes her eyes.  She does have numbness/tingling of the feet and mid-calf, and feels as if she has cinder blocks on her feet.  Despite this, she remains quite active and lives an independent life.  She lives alone and does her own IADLs and ADLs.  She continues to drive and does not have difficulty manipulating the pedals.  She has contemplated moving into an independent living facility, but her children do not feel strongly about this. She has been on anticoagulation therapy for the past year for PAF.   Recent labs indicate vitamin B12 deficiency and is not taking supplementation.   She was previously evaluated at [facility] neurology by Dr. Jacelyn Grip in 2013 for vertigo. CT/A head and neck did not show evidence of intracranial stenosis.   UPDATE 07/25/2019: She is here for 77-month follow-up visit.  Overall, she reports to be doing well with no progressive imbalance or neuropathy.  She is more aware of the numbness in her ankles and lower legs.  Sporadically,  she can have a brief stabbing pain, however this is not often.  She is very compliant using a cane and has not suffered any falls.  She denies any leg weakness.  She lives alone and manages all IADLs and ADLs.  She does not have any difficulty manipulating pedals when driving.  Medications:  Current Outpatient Medications on File Prior to Visit  Medication Sig Dispense Refill  . albuterol (PROVENTIL HFA;VENTOLIN HFA) 108 (90 Base) MCG/ACT inhaler Inhale 2 puffs into the lungs every 6 (six) hours as needed for wheezing or shortness of breath. 1 Inhaler 1  . amiodarone (PACERONE) 200 MG tablet TAKE 1 TABLET DAILY 90 tablet 1  . cholecalciferol (VITAMIN D) 1000 units tablet Take 1,000 Units by mouth daily.    . DiphenhydrAMINE HCl (BENADRYL PO) Take by mouth.    . ELIQUIS 2.5 MG TABS tablet TAKE 1 TABLET TWICE A DAY 180 tablet 4  . ezetimibe (ZETIA) 10 MG tablet TAKE 1 TABLET DAILY 90 tablet 3  . fish oil-omega-3 fatty acids 1000 MG capsule Take 1 g by mouth daily.     . folic acid (FOLVITE) 1 MG tablet TAKE 1 TABLET DAILY 90 tablet 3  . glucosamine-chondroitin 500-400 MG tablet Take 1 tablet by mouth daily.     [name] ipratropium (ATROVENT) 0.06 % nasal spray Place 1-2 sprays into both nostrils 2 (two) times daily as needed for rhinitis. 45 mL 3  . levothyroxine (SYNTHROID, LEVOTHROID) 100 MCG tablet TAKE 1 TABLET EVERY MORNING ON AN EMPTY  STOMACH 90 tablet 3  . loratadine (CLARITIN) 10 MG tablet Take 1 tablet (10 mg total) by mouth daily. Take one tablet twice daily 60 tablet 5  . Melatonin 3 MG TABS Take 3 mg by mouth at bedtime.     . MULTIPLE VITAMIN PO Take 1 tablet by mouth daily. 50+ Senior Vitamin Daily    . omeprazole (PRILOSEC) 40 MG capsule TAKE 1 CAPSULE DAILY (Patient taking differently: TAKE 1 CAPSULE DAILY before dinner) 90 capsule 1  . Polyethyl Glycol-Propyl Glycol (SYSTANE OP) Place 1 drop into both eyes 2 (two) times daily. Use 1-2 Drops in both eyes    . rosuvastatin (CRESTOR) 5 MG  tablet TAKE ONE-HALF (1/2) TABLET DAILY 90 tablet 4  . SALINE NASAL SPRAY NA Place 1 spray into both nostrils 2 (two) times daily as needed (congestion).     . traMADol (ULTRAM) 50 MG tablet     . triamcinolone (NASACORT ALLERGY 24HR) 55 MCG/ACT AERO nasal inhaler Place 1 spray into the nose daily.    . vitamin B-12 (CYANOCOBALAMIN) 1000 MCG tablet Take 1,000 mcg by mouth daily.     No current facility-administered medications on file prior to visit.     Allergies:  Allergies  Allergen Reactions  . Codeine Nausea And Vomiting  . Risedronate Sodium     Upset stomach. Could take Fosamax.  . Statins     Muscles hurt, can take low dose   . Tape Other (See Comments)    Blisters, Please use "paper" tape  . Amoxicillin-Pot Clavulanate Diarrhea    Not allergic  2014, Pt can take z pack only Not allergic  2014, Pt can take z pack only    Review of Systems:  CONSTITUTIONAL: No fevers, chills, night sweats, or weight loss.  EYES: No visual changes or eye pain ENT: No hearing changes.  No history of nose bleeds.   RESPIRATORY: No cough, wheezing and shortness of breath.   CARDIOVASCULAR: Negative for chest pain, and palpitations.   GI: Negative for abdominal discomfort, blood in stools or black stools.  No recent change in bowel habits.   GU:  No history of incontinence.   MUSCLOSKELETAL: No history of joint pain or swelling.  No myalgias.   SKIN: Negative for lesions, rash, and itching.   ENDOCRINE: Negative for cold or heat intolerance, polydipsia or goiter.   PSYCH:  No depression or anxiety symptoms.   NEURO: As Above.   Vital Signs:  BP 110/62   Pulse 74   Ht 5\' 2"  (1.575 m)   Wt 120 lb (54.4 kg)   SpO2 98%   BMI 21.95 kg/m    General Medical Exam:   General:  Well appearing, comfortable  Eyes/ENT: see cranial nerve examination.   Neck:  No carotid bruits. Respiratory:  Clear to auscultation, good air entry bilaterally.   Cardiac:  Regular rate and rhythm, no murmur.    Ext:  No edema   Neurological Exam: MENTAL STATUS including orientation to time, place, person, recent and remote memory, attention span and concentration, language, and fund of knowledge is normal.  Speech is not dysarthric.  CRANIAL NERVES:    Face is symmetric.  MOTOR:  Motor strength is 5/5 in all extremities, except trace toe extension weakness on the right.  No atrophy, fasciculations or abnormal movements.  No pronator drift.  Tone is normal.    MSRs:  Reflexes are 2+/4 throughout, except absent bilateral Achilles reflexes.  SENSORY: Reduced sensation distal to mid-calf to  temperature and pinprick bilaterally.  Vibration is absent distal to ankles.  COORDINATION/GAIT:  Normal finger-to- nose-finger.  Intact rapid alternating movements bilaterally.  Gait is assisted with a cane and appears stable.  Data: Lab Results  Component Value Date   TSH 1.62 11/05/2018   Lab Results  Component Value Date   HGBA1C 5.1 10/14/2016   Lab Results  Component Value Date   YHOOILNZ97 282 01/24/2019     IMPRESSION/PLAN: Idiopathic peripheral neuropathy manifesting with numbness and sensory ataxia, stable.  She is very compliant with using her cane and takes extra caution when walking.  Fortunately, she has not suffered any falls.  She does not have painful paresthesias to warrant any medication.  I encouraged her to continue home exercises for balance.  She was educated on daily foot inspection.     Return to clinic in 9 months.    Thank you for allowing me to participate in patient's care.  If I can answer any additional questions, I would be pleased to do so.    Sincerely,    Donika K. Posey Pronto, DO

## 2019-07-25 NOTE — Patient Instructions (Addendum)
Inspect your feet daily for any bruises or cuts  Always use a cane when walking  Return to clinic in 9 months or sooner as needed

## 2019-07-28 ENCOUNTER — Ambulatory Visit
Admission: RE | Admit: 2019-07-28 | Discharge: 2019-07-28 | Disposition: A | Discharge status: Home / Self Care | Payer: Medicare Other | Source: Ambulatory Visit | Attending: Cardiology | Admitting: Cardiology

## 2019-07-28 DIAGNOSIS — I480 Paroxysmal atrial fibrillation: Secondary | ICD-10-CM

## 2019-07-28 DIAGNOSIS — R0600 Dyspnea, unspecified: Secondary | ICD-10-CM | POA: Diagnosis not present

## 2019-07-28 LAB — COMPREHENSIVE METABOLIC PANEL
ALT: 16 IU/L (ref 0–32)
AST: 21 IU/L (ref 0–40)
Albumin/Globulin Ratio: 1.9 (ref 1.2–2.2)
Albumin: 4.1 g/dL (ref 3.6–4.6)
Alkaline Phosphatase: 71 IU/L (ref 39–117)
BUN/Creatinine Ratio: 20 (ref 12–28)
BUN: 17 mg/dL (ref 8–27)
Bilirubin Total: 0.5 mg/dL (ref 0.0–1.2)
CO2: 24 mmol/L (ref 20–29)
Calcium: 9.8 mg/dL (ref 8.7–10.3)
Chloride: 102 mmol/L (ref 96–106)
Creatinine, Ser: 0.83 mg/dL (ref 0.57–1.00)
GFR calc Af Amer: 73 mL/min/{1.73_m2} (ref >59)
GFR calc non Af Amer: 63 mL/min/{1.73_m2} (ref >59)
Globulin, Total: 2.2 g/dL (ref 1.5–4.5)
Glucose: 80 mg/dL (ref 65–99)
Potassium: 4.7 mmol/L (ref 3.5–5.2)
Sodium: 140 mmol/L (ref 134–144)
Total Protein: 6.3 g/dL (ref 6.0–8.5)

## 2019-07-28 LAB — CBC
Hematocrit: 42.1 % (ref 34.0–46.6)
Hemoglobin: 13.8 g/dL (ref 11.1–15.9)
MCH: 31.9 pg (ref 26.6–33.0)
MCHC: 32.8 g/dL (ref 31.5–35.7)
MCV: 98 fL — ABNORMAL HIGH (ref 79–97)
Platelets: 178 10*3/uL (ref 150–450)
RBC: 4.32 x10E6/uL (ref 3.77–5.28)
RDW: 13.3 % (ref 11.7–15.4)
WBC: 5.4 10*3/uL (ref 3.4–10.8)

## 2019-07-28 LAB — TSH: TSH: 1.54 u[IU]/mL (ref 0.450–4.500)

## 2019-07-29 ENCOUNTER — Encounter: Payer: Self-pay | Admitting: *Deleted

## 2019-08-05 ENCOUNTER — Telehealth: Payer: Self-pay | Admitting: *Deleted

## 2019-08-05 NOTE — Telephone Encounter (Signed)
Waiting for insurance verification. Prolia injection due after 08/09/2019.

## 2019-08-16 ENCOUNTER — Ambulatory Visit: Payer: Medicare Other | Admitting: Internal Medicine

## 2019-08-18 NOTE — Progress Notes (Signed)
Chief Complaint  Patient presents with  . Follow-up    no concerns     HPI: Amy Fischer 85 y.o. come in for Chronic disease management  Yearly visit Neuropathy  b12   No hx of falls   Problematic  Shooting sx at times  No lesions ? anythoing to do ? PAF on anticoaguation EPiphoria   Eye porcedurs stil lnot owrking x 4 and not going to do mpore  hh of  1  TAD no  Sleep some a lot and others not.  Activity walks with cane but no fall s Self care.  imdependent misses bridge playuing  ROS: See pertinent positives and negatives per HPI. 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   Past Medical History:  Diagnosis Date  . Allergy   . Anemia   . Asymptomatic carotid artery stenosis    R ICA 40% stenosis on CTA 12/2011.  . Bladder polyps   . Cataract    BILATERAL-REMOVED  . CHF (congestive heart failure) ([place])   . Diverticulosis   . Elevated blood pressure 03/25/2011   Bp readings borderline today has hx of elvation  In office and ok at home   Not checke recently   She gfeels was elevated from anxiety    . Episodic recurrent vertigo    MRI Head 2003  . Esophageal spasm   . GERD (gastroesophageal reflux disease)   . Hepatic hemangioma   . History of hepatitis    unknown type  . Hyperlipidemia   . Hyperplastic colon polyp   . Hypothyroidism   . Intestinal metaplasia of gastric mucosa   . Osteoarthritis   . Osteoporosis   . Patellar fracture 07/13/2012  . Scapular fracture 03/31/2012   small from direct blow with trip    . Subclavian steal syndrome    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; CTA 2013 revealed open graft    Family History  Problem Relation Age of Onset  . Hypertension Mother   . Stroke Mother   . Heart disease Father   . Colon cancer Neg Hx   . Neurofibromatosis Neg Hx     Social History   Socioeconomic History  .  Marital status: Widowed    Spouse name: Not on file  . Number of children: 4  . Years of education: Not on file  . Highest education level: Bachelor's degree (e.g., BA, AB, BS)  Occupational History  . Occupation: retired Tour manager  . Financial resource strain: Not on file  . Food insecurity    Worry: Not on file    Inability: Not on file  . Transportation needs    Medical: Not on file    Non-medical: Not on file  Tobacco Use  . Smoking status: Former Smoker    Packs/day: 0.75    Years: 20.00    Pack years: 15.00    Quit date: 01/05/1991    Years since quitting: 28.6  . Smokeless tobacco: Never Used  Substance and Sexual Activity  . Alcohol use: Yes    Alcohol/week: 4.0 standard drinks    Types: 4 Glasses of wine per week    Comment: socially  . Drug use: No  . Sexual activity: Not on file  Lifestyle  . Physical activity    Days per  week: Not on file    Minutes per session: Not on file  . Stress: Not on file  Relationships  . Social Herbalist on phone: Not on file    Gets together: Not on file    Attends religious service: Not on file    Active member of club or organization: Not on file    Attends meetings of clubs or organizations: Not on file    Relationship status: Not on file  Other Topics Concern  . Not on file  Social History Narrative   Widowed.  Lives alone in a one story home.  Has 4 children (3 living).  Retired first [employer].    HH of 1    No pets   Former smoker   Exercises regularly   Right handed    Outpatient Medications Prior to Visit  Medication Sig Dispense Refill  . albuterol (PROVENTIL HFA;VENTOLIN HFA) 108 (90 Base) MCG/ACT inhaler Inhale 2 puffs into the lungs every 6 (six) hours as needed for wheezing or shortness of breath. 1 Inhaler 1  . amiodarone (PACERONE) 200 MG tablet TAKE 1 TABLET DAILY 90 tablet 1  . cholecalciferol (VITAMIN D) 1000 units tablet Take 1,000 Units by mouth daily.    . DiphenhydrAMINE  HCl (BENADRYL PO) Take by mouth.    . ELIQUIS 2.5 MG TABS tablet TAKE 1 TABLET TWICE A DAY 180 tablet 4  . ezetimibe (ZETIA) 10 MG tablet TAKE 1 TABLET DAILY 90 tablet 3  . fish oil-omega-3 fatty acids 1000 MG capsule Take 1 g by mouth daily.     . folic acid (FOLVITE) 1 MG tablet TAKE 1 TABLET DAILY 90 tablet 3  . glucosamine-chondroitin 500-400 MG tablet Take 1 tablet by mouth daily.     [name] ipratropium (ATROVENT) 0.06 % nasal spray Place 1-2 sprays into both nostrils 2 (two) times daily as needed for rhinitis. 45 mL 3  . levothyroxine (SYNTHROID, LEVOTHROID) 100 MCG tablet TAKE 1 TABLET EVERY MORNING ON AN EMPTY STOMACH 90 tablet 3  . loratadine (CLARITIN) 10 MG tablet Take 1 tablet (10 mg total) by mouth daily. Take one tablet twice daily 60 tablet 5  . Melatonin 3 MG TABS Take 3 mg by mouth at bedtime.     . MULTIPLE VITAMIN PO Take 1 tablet by mouth daily. 50+ Senior Vitamin Daily    . omeprazole (PRILOSEC) 40 MG capsule TAKE 1 CAPSULE DAILY (Patient taking differently: TAKE 1 CAPSULE DAILY before dinner) 90 capsule 1  . Polyethyl Glycol-Propyl Glycol (SYSTANE OP) Place 1 drop into both eyes 2 (two) times daily. Use 1-2 Drops in both eyes    . rosuvastatin (CRESTOR) 5 MG tablet TAKE ONE-HALF (1/2) TABLET DAILY 90 tablet 4  . SALINE NASAL SPRAY NA Place 1 spray into both nostrils 2 (two) times daily as needed (congestion).     . traMADol (ULTRAM) 50 MG tablet     . triamcinolone (NASACORT ALLERGY 24HR) 55 MCG/ACT AERO nasal inhaler Place 1 spray into the nose daily.    . vitamin B-12 (CYANOCOBALAMIN) 1000 MCG tablet Take 1,000 mcg by mouth daily.     No facility-administered medications prior to visit.      EXAM:  BP 140/60 (BP Location: Right Arm, Patient Position: Sitting, Cuff Size: Normal)   Pulse 94   Temp 98 F (36.7 C) (Temporal)   Wt 120 lb (54.4 kg)   SpO2 97%   BMI 21.95 kg/m   Body mass index is  21.95 kg/m.  GENERAL: vitals reviewed and listed above, alert,  oriented, appears well hydrated and in no acute distress HEENT: atraumatic, conjunctiva  clear, no obvious abnormalities on inspection of external nose and ears  NECK: no obvious masses on inspection palpation  LUNGS: clear to auscultation bilaterally, no wheezes, rales or rhonchi, good air movement  CV: HRRR, no clubbing cyanosis or  peripheral edema nl cap refill  Feet color changes  Reactive nejropathy but no sig eded  MS: moves all extremities arthritis chnages  PSYCH: pleasant and cooperative, no obvious depression or anxiety Lab Results  Component Value Date   WBC 5.4 07/28/2019   HGB 13.8 07/28/2019   HCT 42.1 07/28/2019   PLT 178 07/28/2019   GLUCOSE 80 07/28/2019   CHOL 157 08/03/2018   TRIG 131.0 08/03/2018   HDL 65.20 08/03/2018   LDLCALC 66 08/03/2018   ALT 16 07/28/2019   AST 21 07/28/2019   NA 140 07/28/2019   K 4.7 07/28/2019   CL 102 07/28/2019   CREATININE 0.83 07/28/2019   BUN 17 07/28/2019   CO2 24 07/28/2019   TSH 1.540 07/28/2019   INR 1.02 01/12/2016   HGBA1C 5.1 10/14/2016   BP Readings from Last 3 Encounters:  08/19/19 140/60  07/25/19 110/62  07/18/19 139/63    ASSESSMENT AND PLAN:  Discussed the following assessment and plan:  Osteoporosis, unspecified osteoporosis type, unspecified pathological fracture presence - Plan: denosumab (PROLIA) injection 60 mg, Lipid panel, VITAMIN D 25 Hydroxy (Vit-D Deficiency, Fractures)  Hyperlipidemia, unspecified hyperlipidemia type - Plan: Lipid panel, VITAMIN D 25 Hydroxy (Vit-D Deficiency, Fractures)  Medication management - Plan: Lipid panel, VITAMIN D 25 Hydroxy (Vit-D Deficiency, Fractures), Vitamin B12  Low serum vitamin B12 - Plan: Vitamin B12 Recent  labs  Normal  Needs updated  b12 vit d and lipids   Disc isolation and strategies doing ok planning on getting back to pool for balance  And looking forward .  Neuropathy most problematic  But no  Other intervention at this tim e and foot care .  Fall  risk see neuro good attention  -Patient advised to return or notify health care team  if  new concerns arise. Total visit 25mins > 50% spent counseling and coordinating care as indicated in above note and in instructions to patient .      Patient Instructions  Lab today  .  Will let you know results  Plan rov in 6 months or as needed.   Get influenza vaccine this fall .         [name]. Clay Solum M.D.

## 2019-08-19 ENCOUNTER — Ambulatory Visit (INDEPENDENT_AMBULATORY_CARE_PROVIDER_SITE_OTHER): Payer: Medicare Other | Admitting: Internal Medicine

## 2019-08-19 ENCOUNTER — Other Ambulatory Visit: Payer: Self-pay | Admitting: Internal Medicine

## 2019-08-19 ENCOUNTER — Other Ambulatory Visit: Payer: Self-pay

## 2019-08-19 ENCOUNTER — Encounter: Payer: Self-pay | Admitting: Internal Medicine

## 2019-08-19 VITALS — BP 140/60 | HR 94 | Temp 98.0°F | Wt 120.0 lb

## 2019-08-19 DIAGNOSIS — I2510 Atherosclerotic heart disease of native coronary artery without angina pectoris: Secondary | ICD-10-CM

## 2019-08-19 DIAGNOSIS — E538 Deficiency of other specified B group vitamins: Secondary | ICD-10-CM | POA: Diagnosis not present

## 2019-08-19 DIAGNOSIS — M810 Age-related osteoporosis without current pathological fracture: Secondary | ICD-10-CM

## 2019-08-19 DIAGNOSIS — E785 Hyperlipidemia, unspecified: Secondary | ICD-10-CM | POA: Diagnosis not present

## 2019-08-19 DIAGNOSIS — Z79899 Other long term (current) drug therapy: Secondary | ICD-10-CM | POA: Diagnosis not present

## 2019-08-19 LAB — LIPID PANEL
Cholesterol: 177 mg/dL (ref 0–200)
HDL: 63.7 mg/dL (ref >39.00)
NonHDL: 113.69
Total CHOL/HDL Ratio: 3
Triglycerides: 208 mg/dL — ABNORMAL HIGH (ref 0.0–149.0)
VLDL: 41.6 mg/dL — ABNORMAL HIGH (ref 0.0–40.0)

## 2019-08-19 LAB — VITAMIN B12: Vitamin B-12: 377 pg/mL (ref 211–911)

## 2019-08-19 LAB — VITAMIN D 25 HYDROXY (VIT D DEFICIENCY, FRACTURES): VITD: 40.32 ng/mL (ref 30.00–100.00)

## 2019-08-19 LAB — LDL CHOLESTEROL, DIRECT: Direct LDL: 76 mg/dL

## 2019-08-19 MED ORDER — DENOSUMAB 60 MG/ML ~~LOC~~ SOSY
60.0000 mg | PREFILLED_SYRINGE | Freq: Once | SUBCUTANEOUS | Status: AC
  Administered 2019-08-19: 11:00:00 60 mg via SUBCUTANEOUS

## 2019-08-19 NOTE — Telephone Encounter (Signed)
Approved for Prolia. Appointment 08/19/2019

## 2019-08-19 NOTE — Patient Instructions (Addendum)
Lab today  .  Will let you know results  Plan rov in 6 months or as needed.   Get influenza vaccine this fall .

## 2019-09-13 DIAGNOSIS — Z23 Encounter for immunization: Secondary | ICD-10-CM | POA: Diagnosis not present

## 2019-09-30 ENCOUNTER — Other Ambulatory Visit: Payer: Self-pay | Admitting: Physician Assistant

## 2019-10-07 ENCOUNTER — Encounter: Payer: Self-pay | Admitting: Neurology

## 2019-10-21 DIAGNOSIS — G43109 Migraine with aura, not intractable, without status migrainosus: Secondary | ICD-10-CM | POA: Diagnosis not present

## 2019-10-21 DIAGNOSIS — Z961 Presence of intraocular lens: Secondary | ICD-10-CM | POA: Diagnosis not present

## 2019-11-22 DIAGNOSIS — Z961 Presence of intraocular lens: Secondary | ICD-10-CM | POA: Diagnosis not present

## 2019-11-29 ENCOUNTER — Other Ambulatory Visit: Payer: Self-pay

## 2019-11-29 ENCOUNTER — Encounter: Payer: Self-pay | Admitting: Allergy and Immunology

## 2019-11-29 ENCOUNTER — Ambulatory Visit (INDEPENDENT_AMBULATORY_CARE_PROVIDER_SITE_OTHER): Payer: Medicare Other | Admitting: Allergy and Immunology

## 2019-11-29 VITALS — BP 138/74 | HR 81 | Temp 97.1°F | Resp 20 | Ht 61.5 in | Wt 121.0 lb

## 2019-11-29 DIAGNOSIS — K219 Gastro-esophageal reflux disease without esophagitis: Secondary | ICD-10-CM | POA: Diagnosis not present

## 2019-11-29 DIAGNOSIS — I2510 Atherosclerotic heart disease of native coronary artery without angina pectoris: Secondary | ICD-10-CM

## 2019-11-29 DIAGNOSIS — J438 Other emphysema: Secondary | ICD-10-CM | POA: Diagnosis not present

## 2019-11-29 DIAGNOSIS — J3089 Other allergic rhinitis: Secondary | ICD-10-CM

## 2019-11-29 MED ORDER — ALBUTEROL SULFATE HFA 108 (90 BASE) MCG/ACT IN AERS: 2.0000 | INHALATION_SPRAY | Freq: Four times a day (QID) | RESPIRATORY_TRACT | 2 refills | Status: DC | PRN

## 2019-11-29 MED ORDER — IPRATROPIUM BROMIDE 0.06 % NA SOLN: 1.0000 | Freq: Two times a day (BID) | NASAL | 5 refills | Status: DC | PRN

## 2019-11-29 NOTE — Progress Notes (Signed)
[facility] [place] [facility] [place]   Follow-up Note  Referring Provider: Burnis Medin, MD Primary Provider: Burnis Medin, MD Date of Office Visit: 11/29/2019  Subjective:   Amy Fischer (DOB: Jan 05, 1929) is a 85 y.o. female who returns to the [facility] on 11/29/2019 in re-evaluation of the following:  HPI: Amy Fischer returns to this clinic in evaluation of allergic and nonallergic rhinitis and history of LPR and history of emphysema.  Her last visit to this clinic was 17 June 2018.  Dayane continues to do quite well regarding her airway issue.  Rarely does she have any problems with her nose.  She has no issues with her throat and no throat clearing.  She has no issues with reflux.  She is very careful about consuming caffeine and chocolates.  Her breathing is doing relatively okay.  If she walks long distances she sometimes gets short of breath.  She has been exercising with water aerobics for 30 minutes per session about 3 times per week without any difficulty.  She must use a short acting bronchodilator about 1 time out of 10 of those exercise sessions.  She has not required the administration of systemic steroid or antibiotic for an airway issue.  She did receive the flu vaccine.  Allergies as of 11/29/2019      Reactions   Codeine Nausea And Vomiting   Risedronate Sodium    Upset stomach. Could take Fosamax.   Statins    Muscles hurt, can take low dose   Tape Other (See Comments)   Blisters, Please use "paper" tape   Amoxicillin-pot Clavulanate Diarrhea   Not allergic  2014, Pt can take z pack only Not allergic  2014, Pt can take z pack only      Medication List      albuterol 108 (90 Base) MCG/ACT inhaler Commonly known as: VENTOLIN HFA Inhale 2 puffs into the lungs every 6 (six) hours as needed for wheezing or shortness of breath.   amiodarone 200 MG tablet Commonly known as: PACERONE TAKE 1 TABLET DAILY   BENADRYL PO  Take by mouth.   cholecalciferol 1000 units tablet Commonly known as: VITAMIN D Take 1,000 Units by mouth daily.   Eliquis 2.5 MG Tabs tablet Generic drug: apixaban TAKE 1 TABLET TWICE A DAY   ezetimibe 10 MG tablet Commonly known as: ZETIA TAKE 1 TABLET DAILY   fish oil-omega-3 fatty acids 1000 MG capsule Take 1 g by mouth daily.   folic acid 1 MG tablet Commonly known as: FOLVITE TAKE 1 TABLET DAILY   glucosamine-chondroitin 500-400 MG tablet Take 1 tablet by mouth daily.   ipratropium 0.06 % nasal spray Commonly known as: ATROVENT Place 1-2 sprays into both nostrils 2 (two) times daily as needed for rhinitis.   levothyroxine 100 MCG tablet Commonly known as: SYNTHROID TAKE 1 TABLET EVERY MORNING ON AN EMPTY STOMACH   loratadine 10 MG tablet Commonly known as: CLARITIN Take 1 tablet (10 mg total) by mouth daily. Take one tablet twice daily   Melatonin 3 MG Tabs Take 3 mg by mouth at bedtime.   MULTIPLE VITAMIN PO Take 1 tablet by mouth daily. 50+ Senior Vitamin Daily   Nasacort Allergy 24HR 55 MCG/ACT Aero nasal inhaler Generic drug: triamcinolone Place 1 spray into the nose daily.   omeprazole 40 MG capsule Commonly known as: PRILOSEC TAKE 1 CAPSULE DAILY   rosuvastatin 5 MG tablet Commonly known as: CRESTOR TAKE  ONE-HALF (1/2) TABLET DAILY   SALINE NASAL SPRAY NA Place 1 spray into both nostrils 2 (two) times daily as needed (congestion).   SYSTANE OP Place 1 drop into both eyes 2 (two) times daily. Use 1-2 Drops in both eyes   vitamin B-12 1000 MCG tablet Commonly known as: CYANOCOBALAMIN Take 1,000 mcg by mouth daily.       Past Medical History:  Diagnosis Date  . Allergy   . Anemia   . Asymptomatic carotid artery stenosis    R ICA 40% stenosis on CTA 12/2011.  . Bladder polyps   . Cataract    BILATERAL-REMOVED  . CHF (congestive heart failure) ([place])   . Diverticulosis   . Elevated blood pressure 03/25/2011   Bp readings borderline  today has hx of elvation  In office and ok at home   Not checke recently   She gfeels was elevated from anxiety    . Episodic recurrent vertigo    MRI Head 2003  . Esophageal spasm   . GERD (gastroesophageal reflux disease)   . Hepatic hemangioma   . History of hepatitis    unknown type  . Hyperlipidemia   . Hyperplastic colon polyp   . Hypothyroidism   . Intestinal metaplasia of gastric mucosa   . Osteoarthritis   . Osteoporosis   . Patellar fracture 07/13/2012  . Scapular fracture 03/31/2012   small from direct blow with trip    . Subclavian steal syndrome    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; CTA 2013 revealed open graft    Past Surgical History:  Procedure Laterality Date  . APPENDECTOMY    . CARDIAC CATHETERIZATION N/A 10/04/2015   Procedure: Right/Left Heart Cath and Coronary Angiography;  Surgeon: Belva Crome, MD;  Location: [facility];  Service: Cardiovascular;  Laterality: N/A;  . CAROTID-SUBCLAVIAN BYPASS GRAFT Left 1999   for [place] steal syndrome  . CHOLECYSTECTOMY    . COLONOSCOPY    . CYSTECTOMY Left    hand  . ELBOW SURGERY Left   . KNEE SURGERY Left 2014  . MITRAL VALVE REPAIR N/A 10/10/2015   Procedure: MITRAL VALVE REPAIR (MVR) WITH SIZE 28 CARPENTIER-EDWARDS PHYSIO II ANNULOPLASTY RING;  Surgeon: [name], MD;  Location: [facility];  Service: Open Heart Surgery;  Laterality: N/A;  . ROTATOR CUFF REPAIR Left   . TEAR DUCT PROBING  07/2014  . TEE WITHOUT CARDIOVERSION N/A 10/03/2015   Procedure: TRANSESOPHAGEAL ECHOCARDIOGRAM (TEE);  Surgeon: Larey Dresser, MD;  Location: [facility];  Service: Cardiovascular;  Laterality: N/A;  . TEE WITHOUT CARDIOVERSION N/A 10/10/2015   Procedure: TRANSESOPHAGEAL ECHOCARDIOGRAM (TEE);  Surgeon: [name], MD;  Location: [facility];  Service: Open Heart Surgery;  Laterality: N/A;  . TONSILLECTOMY    . TUBAL LIGATION      Review of systems negative except as noted in HPI / PMHx or noted below:   Review of Systems  Constitutional: Negative.   HENT: Negative.   Eyes: Negative.   Respiratory: Negative.   Cardiovascular: Negative.   Gastrointestinal: Negative.   Genitourinary: Negative.   Musculoskeletal: Negative.   Skin: Negative.   Neurological: Negative.   Endo/Heme/Allergies: Negative.   Psychiatric/Behavioral: Negative.      Objective:   Vitals:   11/29/19 1022  BP: 138/74  Pulse: 81  Resp: 20  Temp: (!) 97.1 F (36.2 C)  SpO2: 96%   Height: 5' 1.5" (156.2 cm)  Weight: 121 lb (54.9 kg)  Physical Exam Constitutional:      Appearance: She is not diaphoretic.  HENT:     Head: Normocephalic.     Right Ear: Tympanic membrane, ear canal and external ear normal.     Left Ear: Tympanic membrane, ear canal and external ear normal.     Nose: Nose normal. No mucosal edema or rhinorrhea.     Mouth/Throat:     Pharynx: Uvula midline. No oropharyngeal exudate.  Eyes:     Conjunctiva/sclera: Conjunctivae normal.  Neck:     Thyroid: No thyromegaly.     Trachea: Trachea normal. No tracheal tenderness or tracheal deviation.  Cardiovascular:     Rate and Rhythm: Normal rate and regular rhythm.     Heart sounds: Normal heart sounds, S1 normal and S2 normal. No murmur.  Pulmonary:     Effort: No respiratory distress.     Breath sounds: Normal breath sounds. No stridor. No wheezing or rales.  Lymphadenopathy:     Head:     Right side of head: No tonsillar adenopathy.     Left side of head: No tonsillar adenopathy.     Cervical: No cervical adenopathy.  Skin:    Findings: No erythema or rash.     Nails: There is no clubbing.   Neurological:     Mental Status: She is alert.     Diagnostics:    Spirometry was performed and demonstrated an FEV1 of 1.01 at 70 % of predicted.  The patient had an Asthma Control Test with the following results: ACT Total Score: 20.    Assessment and Plan:   1. Other allergic rhinitis   2. Other emphysema ([place])   3. LPRD  (laryngopharyngeal reflux disease)        1.  Continue to Treat and prevent reflux:   A.  Omeprazole 40 mg tablet 1 time per day if needed  B.  Consolidate caffeine use  2. If needed:    A. Albuterol HFA - 2 inhalations or Duoneb nebulization every 6 hours    B. Loratadine 10 mg one time per day  C. Nasal ipratropium 0.06 - 1-2 sprays each nostril every 6 hours   3. Return to clinic in 1 year or earlier if problem  4. Obtain COVID vaccine when available   Onyinyechi is really doing quite well on her current plan of action which includes the use of therapy directed against reflux and several medications to be utilized should they be required as noted above.  Assuming she continues to do well with this plan I will see her back in this clinic in 1 year or earlier if there is a problem.  Allena Katz, MD Allergy / Immunology [place]

## 2019-11-29 NOTE — Patient Instructions (Addendum)
   1.  Continue to Treat and prevent reflux:   A.  Omeprazole 40 mg tablet 1 time per day if needed  B.  Consolidate caffeine use  2. If needed:    A. Albuterol HFA - 2 inhalations or Duoneb nebulization every 6 hours    B. Loratadine 10 mg one time per day  C. Nasal ipratropium 0.06 - 1-2 sprays each nostril every 6 hours   3. Return to clinic in 1 year or earlier if problem  4. Obtain COVID vaccine when available

## 2019-11-30 ENCOUNTER — Encounter: Payer: Self-pay | Admitting: Allergy and Immunology

## 2019-12-05 ENCOUNTER — Telehealth: Payer: Self-pay | Admitting: Cardiology

## 2019-12-05 MED ORDER — CEPHALEXIN 500 MG PO CAPS: ORAL_CAPSULE | ORAL | 0 refills | Status: DC

## 2019-12-05 NOTE — Telephone Encounter (Signed)
New Message  1. What dental office are you calling from?[facility]  2. What is your office phone number? [phone]  3. What is your fax number? [phone]  4. What type of procedure is the patient having performed? 2 Complex Extractions   5. What date is procedure scheduled or is the patient there now? TBD (if the patient is at the dentist's office question goes to their cardiologist if he/she is in the office.  If not, question should go to the DOD).   6. What is your question (ex. Antibiotics prior to procedure, holding medication-we need to know how long dentist wants pt to hold med)? Needs to know if she needs antibiotics prior to the procedure and if the Eliquis needs to be held and for how long.

## 2019-12-05 NOTE — Telephone Encounter (Signed)
Pharmacy can you comment on SBE prophylaxis for this [facility] allergic patient (see Dr Jacalyn Lefevre last note) prior to "complex" dental extraction. Since the procedure was described as "complex" I guess we will also need to make recommendations for holding Eliquis.  Thanks  Kerin Ransom PA-C 12/05/2019 4:11 PM

## 2019-12-05 NOTE — Telephone Encounter (Signed)
Patient with diagnosis of afib on Eliquis for anticoagulation.    Procedure:  2 Complex Extractions  Date of procedure: TBD  CHADS2-VASc score of  6 (CHF, HTN, AGE, CAD, AGE, female)  CrCl 40 ml/min  Per office protocol, patient can hold Elqiuis for 1 day prior to procedure.    Patient will need SBE prophylaxis with cephalexin 2g once 30-60 min prior to procedure.

## 2019-12-06 NOTE — Telephone Encounter (Signed)
   Primary Cardiologist: Kirk Ruths, MD  Chart reviewed as part of pre-operative protocol coverage. Given past medical history and time since last visit, based on ACC/AHA guidelines, KADYN PRIZZI would be at acceptable risk for the planned procedure without further cardiovascular testing.   Discussed with the patient today- she is not "allergic" to PNC-only intolerant if she has to take it for more than a few days.  She usually takes Amoxacillin prior to dental work and tolerates this well.  She can hold her Eliquis one day prior to her procedure.   I will route this recommendation to the requesting party via Epic fax function and remove from pre-op pool.  Please call with questions.  Kerin Ransom, PA-C 12/06/2019, 10:25 AM

## 2020-01-03 ENCOUNTER — Ambulatory Visit: Payer: Medicare Other | Attending: Internal Medicine

## 2020-01-03 DIAGNOSIS — Z20822 Contact with and (suspected) exposure to covid-19: Secondary | ICD-10-CM | POA: Diagnosis not present

## 2020-01-05 LAB — NOVEL CORONAVIRUS, NAA: SARS-CoV-2, NAA: NOT DETECTED

## 2020-01-10 NOTE — Progress Notes (Signed)
HPI: Follow-up mitral valve repair. Patient hospitalized October 2016 with congestive heart failure. Transesophageal echocardiogram showed normal LV function. There was a flail posterior leaflet with severe mitral regurgitation. There was mild to moderate tricuspid regurgitation. Cardiac catheterization October 2016 showed a 30% LAD, 60% first diagonal and 30% right coronary artery. Ejection fraction 60%. Patient subsequent had mitral valve repair. Note preoperative carotid Dopplers showed 1-39% stenosis. Note the patient had atrial fibrillation after surgery.Follow-up monitor March 2019 showed sinus rhythm with PVCs and paroxysmal atrial flutter/fibrillation. Last echocardiogram April 2019 showed normal LV function, moderate left ventricular hypertrophy, prior mitral valve repair and mild tricuspid regurgitation. Patient placed on amiodarone andapixaban. Since last seen, she denies dyspnea, chest pain.  Occasional palpitations at night.  She also has episodes where she suddenly feels lightheaded for 1 to 2 seconds and resolves.  This happens multiple times per day.  She has not had syncope.  It is not clearly positional.  Current Outpatient Medications  Medication Sig Dispense Refill  . albuterol (VENTOLIN HFA) 108 (90 Base) MCG/ACT inhaler Inhale 2 puffs into the lungs every 6 (six) hours as needed for wheezing or shortness of breath. 18 g 2  . amiodarone (PACERONE) 200 MG tablet TAKE 1 TABLET DAILY 90 tablet 3  . cephALEXin (KEFLEX) 500 MG capsule Take 4 capsules by mouth once 30-60 min prior to dental procedure. 4 capsule 0  . cholecalciferol (VITAMIN D) 1000 units tablet Take 1,000 Units by mouth daily.    . DiphenhydrAMINE HCl (BENADRYL PO) Take by mouth.    . ELIQUIS 2.5 MG TABS tablet TAKE 1 TABLET TWICE A DAY 180 tablet 4  . ezetimibe (ZETIA) 10 MG tablet TAKE 1 TABLET DAILY 90 tablet 3  . fish oil-omega-3 fatty acids 1000 MG capsule Take 1 g by mouth daily.     . folic acid  (FOLVITE) 1 MG tablet TAKE 1 TABLET DAILY 90 tablet 3  . ipratropium (ATROVENT) 0.06 % nasal spray Place 1-2 sprays into both nostrils 2 (two) times daily as needed for rhinitis. 45 mL 5  . levothyroxine (SYNTHROID, LEVOTHROID) 100 MCG tablet TAKE 1 TABLET EVERY MORNING ON AN EMPTY STOMACH 90 tablet 3  . Melatonin 3 MG TABS Take 3 mg by mouth at bedtime.     . MULTIPLE VITAMIN PO Take 1 tablet by mouth daily. 50+ Senior Vitamin Daily    . omeprazole (PRILOSEC) 40 MG capsule TAKE 1 CAPSULE DAILY (Patient taking differently: TAKE 1 CAPSULE DAILY before dinner) 90 capsule 1  . Polyethyl Glycol-Propyl Glycol (SYSTANE OP) Place 1 drop into both eyes 2 (two) times daily. Use 1-2 Drops in both eyes    . rosuvastatin (CRESTOR) 5 MG tablet TAKE ONE-HALF (1/2) TABLET DAILY 90 tablet 4  . SALINE NASAL SPRAY NA Place 1 spray into both nostrils 2 (two) times daily as needed (congestion).     [name] glucosamine-chondroitin 500-400 MG tablet Take 1 tablet by mouth daily.     [name] loratadine (CLARITIN) 10 MG tablet Take 1 tablet (10 mg total) by mouth daily. Take one tablet twice daily (Patient not taking: Reported on 01/17/2020) 60 tablet 5  . triamcinolone (NASACORT ALLERGY 24HR) 55 MCG/ACT AERO nasal inhaler Place 1 spray into the nose daily.    . vitamin B-12 (CYANOCOBALAMIN) 1000 MCG tablet Take 1,000 mcg by mouth daily.     No current facility-administered medications for this visit.     Past Medical History:  Diagnosis Date  . Allergy   .  Anemia   . Asymptomatic carotid artery stenosis    R ICA 40% stenosis on CTA 12/2011.  . Bladder polyps   . Cataract    BILATERAL-REMOVED  . CHF (congestive heart failure) ([place])   . Diverticulosis   . Elevated blood pressure 03/25/2011   Bp readings borderline today has hx of elvation  In office and ok at home   Not checke recently   She gfeels was elevated from anxiety    . Episodic recurrent vertigo    MRI Head 2003  . Esophageal spasm   . GERD (gastroesophageal  reflux disease)   . Hepatic hemangioma   . History of hepatitis    unknown type  . Hyperlipidemia   . Hyperplastic colon polyp   . Hypothyroidism   . Intestinal metaplasia of gastric mucosa   . Osteoarthritis   . Osteoporosis   . Patellar fracture 07/13/2012  . Scapular fracture 03/31/2012   small from direct blow with trip    . Subclavian steal syndrome    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; CTA 2013 revealed open graft    Past Surgical History:  Procedure Laterality Date  . APPENDECTOMY    . CARDIAC CATHETERIZATION N/A 10/04/2015   Procedure: Right/Left Heart Cath and Coronary Angiography;  Surgeon: Belva Crome, MD;  Location: [facility];  Service: Cardiovascular;  Laterality: N/A;  . CAROTID-SUBCLAVIAN BYPASS GRAFT Left 1999   for [place] steal syndrome  . CHOLECYSTECTOMY    . COLONOSCOPY    . CYSTECTOMY Left    hand  . ELBOW SURGERY Left   . KNEE SURGERY Left 2014  . MITRAL VALVE REPAIR N/A 10/10/2015   Procedure: MITRAL VALVE REPAIR (MVR) WITH SIZE 28 CARPENTIER-EDWARDS PHYSIO II ANNULOPLASTY RING;  Surgeon: [name], MD;  Location: [facility];  Service: Open Heart Surgery;  Laterality: N/A;  . ROTATOR CUFF REPAIR Left   . TEAR DUCT PROBING  07/2014  . TEE WITHOUT CARDIOVERSION N/A 10/03/2015   Procedure: TRANSESOPHAGEAL ECHOCARDIOGRAM (TEE);  Surgeon: Larey Dresser, MD;  Location: [facility];  Service: Cardiovascular;  Laterality: N/A;  . TEE WITHOUT CARDIOVERSION N/A 10/10/2015   Procedure: TRANSESOPHAGEAL ECHOCARDIOGRAM (TEE);  Surgeon: [name], MD;  Location: [facility];  Service: Open Heart Surgery;  Laterality: N/A;  . TONSILLECTOMY    . TUBAL LIGATION      Social History   Socioeconomic History  . Marital status: Widowed    Spouse name: Not on file  . Number of children: 4  . Years of education: Not on file  . Highest education level: Bachelor's degree (e.g., BA, AB, BS)  Occupational History  . Occupation: retired Pharmacist, hospital    Tobacco Use  . Smoking status: Former Smoker    Packs/day: 0.75    Years: 20.00    Pack years: 15.00    Quit date: 01/05/1991    Years since quitting: 29.0  . Smokeless tobacco: Never Used  Substance and Sexual Activity  . Alcohol use: Yes    Alcohol/week: 4.0 standard drinks    Types: 4 Glasses of wine per week    Comment: socially  . Drug use: No  . Sexual activity: Not on file  Other Topics Concern  . Not on file  Social History Narrative   Widowed.  Lives alone in a one story home.  Has 4 children (3 living).  Retired [employer]    HH of 1    No pets   Former  smoker   Exercises regularly   Right handed   Social Determinants of Health   Financial Resource Strain:   . Difficulty of Paying Living Expenses: Not on file  Food Insecurity:   . Worried About Charity fundraiser in the Last Year: Not on file  . Ran Out of Food in the Last Year: Not on file  Transportation Needs:   . Lack of Transportation (Medical): Not on file  . Lack of Transportation (Non-Medical): Not on file  Physical Activity:   . Days of Exercise per Week: Not on file  . Minutes of Exercise per Session: Not on file  Stress:   . Feeling of Stress : Not on file  Social Connections:   . Frequency of Communication with Friends and Family: Not on file  . Frequency of Social Gatherings with Friends and Family: Not on file  . Attends Religious Services: Not on file  . Active Member of Clubs or Organizations: Not on file  . Attends Archivist Meetings: Not on file  . Marital Status: Not on file  Intimate Partner Violence:   . Fear of Current or Ex-Partner: Not on file  . Emotionally Abused: Not on file  . Physically Abused: Not on file  . Sexually Abused: Not on file    Family History  Problem Relation Age of Onset  . Hypertension Mother   . Stroke Mother   . Heart disease Father   . Colon cancer Neg Hx   . Neurofibromatosis Neg Hx   . Allergic rhinitis Neg Hx   . Asthma Neg  Hx   . Angioedema Neg Hx   . Atopy Neg Hx   . Eczema Neg Hx   . Immunodeficiency Neg Hx   . Urticaria Neg Hx     ROS: no fevers or chills, productive cough, hemoptysis, dysphasia, odynophagia, melena, hematochezia, dysuria, hematuria, rash, seizure activity, orthopnea, PND, pedal edema, claudication. Remaining systems are negative.  Physical Exam: Well-developed well-nourished in no acute distress.  Skin is warm and dry.  HEENT is normal.  Neck is supple.  Chest is clear to auscultation with normal expansion.  Cardiovascular exam is regular rate and rhythm.  Abdominal exam nontender or distended. No masses palpated. Extremities show no edema. neuro grossly intact  ECG-sinus rhythm with first-degree AV block, cannot rule out prior inferior infarct.  Personally reviewed  A/P  1 paroxysmal atrial fibrillation-patient is in sinus rhythm today.  Continue amiodarone at present dose.  Check chest x-ray, TSH and liver functions.  Continue apixaban.  Check hemoglobin and renal function.  She is having occasional brief dizzy spells lasting 1 to 2 seconds.  Question bradycardia mediated.  I will arrange a monitor to further assess.  2 prior mitral valve repair-continue SBE prophylaxis.  3 coronary artery disease-continue statin.  No aspirin given need for apixaban.   4 hypertension-blood pressure controlled.  Continue present medications.  5 hyperlipidemia-continue statin.  Check lipids and liver.  Kirk Ruths, MD

## 2020-01-17 ENCOUNTER — Other Ambulatory Visit: Payer: Self-pay

## 2020-01-17 ENCOUNTER — Encounter: Payer: Self-pay | Admitting: Cardiology

## 2020-01-17 ENCOUNTER — Ambulatory Visit
Admission: RE | Admit: 2020-01-17 | Discharge: 2020-01-17 | Disposition: A | Discharge status: Home / Self Care | Payer: Medicare Other | Source: Ambulatory Visit | Attending: Cardiology | Admitting: Cardiology

## 2020-01-17 ENCOUNTER — Telehealth: Payer: Self-pay | Admitting: Radiology

## 2020-01-17 ENCOUNTER — Ambulatory Visit (INDEPENDENT_AMBULATORY_CARE_PROVIDER_SITE_OTHER): Payer: Medicare Other | Admitting: Cardiology

## 2020-01-17 VITALS — BP 132/78 | HR 84 | Temp 97.8°F | Ht 62.0 in | Wt 120.6 lb

## 2020-01-17 DIAGNOSIS — Z9889 Other specified postprocedural states: Secondary | ICD-10-CM

## 2020-01-17 DIAGNOSIS — I480 Paroxysmal atrial fibrillation: Secondary | ICD-10-CM

## 2020-01-17 DIAGNOSIS — R55 Syncope and collapse: Secondary | ICD-10-CM

## 2020-01-17 DIAGNOSIS — I2510 Atherosclerotic heart disease of native coronary artery without angina pectoris: Secondary | ICD-10-CM

## 2020-01-17 DIAGNOSIS — R918 Other nonspecific abnormal finding of lung field: Secondary | ICD-10-CM | POA: Diagnosis not present

## 2020-01-17 DIAGNOSIS — I10 Essential (primary) hypertension: Secondary | ICD-10-CM

## 2020-01-17 NOTE — Patient Instructions (Addendum)
Medication Instructions:  NO CHANGE *If you need a refill on your cardiac medications before your next appointment, please call your pharmacy*  Lab Work: Your physician recommends that you HAVE LAB WORK TODAY If you have labs (blood work) drawn today and your tests are completely normal, you will receive your results only by: [name] MyChart Message (if you have MyChart) OR . A paper copy in the mail If you have any lab test that is abnormal or we need to change your treatment, we will call you to review the results.  Testing/Procedures:  A chest x-ray takes a picture of the organs and structures inside the chest, including the heart, lungs, and blood vessels. This test can show several things, including, whether the heart is enlarges; whether fluid is building up in the lungs; and whether pacemaker / defibrillator leads are still in place. [place] Monitor Instructions   Your physician has requested you wear your ZIO patch monitor____3___days.   This is a single patch monitor.  [employer] supplies one patch monitor per enrollment.  Additional stickers are not available.   Please do not apply patch if you will be having a Nuclear Stress Test, Echocardiogram, Cardiac CT, MRI, or Chest Xray during the time frame you would be wearing the monitor. The patch cannot be worn during these tests.  You cannot remove and re-apply the ZIO XT patch monitor.   Your ZIO patch monitor will be sent USPS Priority mail from [facility] directly to your home address. The monitor may also be mailed to a PO BOX if home delivery is not available.   It may take 3-5 days to receive your monitor after you have been enrolled.   Once you have received you monitor, please review enclosed instructions.  Your monitor has already been registered assigning a specific monitor serial # to you.   Applying the monitor   Shave hair from upper left chest.   Hold abrader  disc by orange tab.  Rub abrader in 40 strokes over left upper chest as indicated in your monitor instructions.   Clean area with 4 enclosed alcohol pads .  Use all pads to assure are is cleaned thoroughly.  Let dry.   Apply patch as indicated in monitor instructions.  Patch will be place under collarbone on left side of chest with arrow pointing upward.   Rub patch adhesive wings for 2 minutes.Remove white label marked "1".  Remove white label marked "2".  Rub patch adhesive wings for 2 additional minutes.   While looking in a mirror, press and release button in center of patch.  A small green light will flash 3-4 times .  This will be your only indicator the monitor has been turned on.     Do not shower for the first 24 hours.  You may shower after the first 24 hours.   Press button if you feel a symptom. You will hear a small click.  Record Date, Time and Symptom in the Patient Log Book.   When you are ready to remove patch, follow instructions on last 2 pages of Patient Log Book.  Stick patch monitor onto last page of Patient Log Book.   Place Patient Log Book in [place] box.  Use locking tab on box and tape box closed securely.  The [employer] and [employer] has [employer] on it.  Please place in mailbox as soon as possible.  Your physician should have your  test results approximately 7 days after the monitor has been mailed back to [facility].   Call [place] at [phone] if you have questions regarding your ZIO XT patch monitor.  Call them immediately if you see an orange light blinking on your monitor.   If your monitor falls off in less than 4 days contact our Monitor department at [phone].  If your monitor becomes loose or falls off after 4 days call [employer] at [phone] for suggestions on securing your monitor.     Follow-Up: At [facility], you and your health needs are our priority.  As part of our continuing mission to provide you with  exceptional heart care, we have created designated Provider Care Teams.  These Care Teams include your primary Cardiologist (physician) and Advanced Practice Providers (APPs -  Physician Assistants and Nurse Practitioners) who all work together to provide you with the care you need, when you need it.  Your next appointment:   6 month(s)  The format for your next appointment:   Either In Person or Virtual  Provider:   You may see Kirk Ruths, MD or one of the following Advanced Practice Providers on your designated Care Team:    Kerin Ransom, PA-C  [place], [place]  Coletta Memos, [place]

## 2020-01-17 NOTE — Telephone Encounter (Signed)
Enrolled patient for a 3 day Zio monitor to be mailed to patients home.  

## 2020-01-18 ENCOUNTER — Encounter: Payer: Self-pay | Admitting: *Deleted

## 2020-01-18 LAB — TSH: TSH: 0.965 u[IU]/mL (ref 0.450–4.500)

## 2020-01-18 LAB — COMPREHENSIVE METABOLIC PANEL
ALT: 15 IU/L (ref 0–32)
AST: 19 IU/L (ref 0–40)
Albumin/Globulin Ratio: 1.8 (ref 1.2–2.2)
Albumin: 4.1 g/dL (ref 3.6–4.6)
Alkaline Phosphatase: 70 IU/L (ref 39–117)
BUN/Creatinine Ratio: 15 (ref 12–28)
BUN: 13 mg/dL (ref 8–27)
Bilirubin Total: 0.5 mg/dL (ref 0.0–1.2)
CO2: 25 mmol/L (ref 20–29)
Calcium: 9.9 mg/dL (ref 8.7–10.3)
Chloride: 106 mmol/L (ref 96–106)
Creatinine, Ser: 0.86 mg/dL (ref 0.57–1.00)
GFR calc Af Amer: 70 mL/min/{1.73_m2} (ref >59)
GFR calc non Af Amer: 61 mL/min/{1.73_m2} (ref >59)
Globulin, Total: 2.3 g/dL (ref 1.5–4.5)
Glucose: 75 mg/dL (ref 65–99)
Potassium: 4.7 mmol/L (ref 3.5–5.2)
Sodium: 143 mmol/L (ref 134–144)
Total Protein: 6.4 g/dL (ref 6.0–8.5)

## 2020-01-18 LAB — CBC
Hematocrit: 43.8 % (ref 34.0–46.6)
Hemoglobin: 14.3 g/dL (ref 11.1–15.9)
MCH: 32 pg (ref 26.6–33.0)
MCHC: 32.6 g/dL (ref 31.5–35.7)
MCV: 98 fL — ABNORMAL HIGH (ref 79–97)
Platelets: 192 10*3/uL (ref 150–450)
RBC: 4.47 x10E6/uL (ref 3.77–5.28)
RDW: 13.1 % (ref 11.7–15.4)
WBC: 5.9 10*3/uL (ref 3.4–10.8)

## 2020-01-18 LAB — LIPID PANEL
Chol/HDL Ratio: 2.1 ratio (ref 0.0–4.4)
Cholesterol, Total: 154 mg/dL (ref 100–199)
HDL: 73 mg/dL (ref >39)
LDL Chol Calc (NIH): 63 mg/dL (ref 0–99)
Triglycerides: 100 mg/dL (ref 0–149)
VLDL Cholesterol Cal: 18 mg/dL (ref 5–40)

## 2020-01-19 ENCOUNTER — Other Ambulatory Visit (INDEPENDENT_AMBULATORY_CARE_PROVIDER_SITE_OTHER): Payer: Medicare Other

## 2020-01-19 DIAGNOSIS — R55 Syncope and collapse: Secondary | ICD-10-CM

## 2020-01-22 ENCOUNTER — Ambulatory Visit: Payer: Medicare Other | Attending: Internal Medicine

## 2020-01-22 DIAGNOSIS — Z23 Encounter for immunization: Secondary | ICD-10-CM | POA: Insufficient documentation

## 2020-01-22 NOTE — Progress Notes (Signed)
   Covid-19 Vaccination Clinic  Name:  Amy Fischer    MRN: [id number] DOB: November 02, 1929  01/22/2020  [name] was observed post Covid-19 immunization for 30 minutes based on pre-vaccination screening without incidence. She was provided with Vaccine Information Sheet and instruction to access the V-Safe system.   [name] was instructed to call 911 with any severe reactions post vaccine: [name] Difficulty breathing  . Swelling of your face and throat  . A fast heartbeat  . A bad rash all over your body  . Dizziness and weakness    Immunizations Administered    Name Date Dose VIS Date Route   Pfizer COVID-19 Vaccine 01/22/2020  2:48 PM 0.3 mL 12/09/2019 Intramuscular   Manufacturer: [place]   Lot: [id number]   [place]: [id number]

## 2020-01-31 DIAGNOSIS — D1801 Hemangioma of skin and subcutaneous tissue: Secondary | ICD-10-CM | POA: Diagnosis not present

## 2020-01-31 DIAGNOSIS — Z85828 Personal history of other malignant neoplasm of skin: Secondary | ICD-10-CM | POA: Diagnosis not present

## 2020-01-31 DIAGNOSIS — L821 Other seborrheic keratosis: Secondary | ICD-10-CM | POA: Diagnosis not present

## 2020-01-31 DIAGNOSIS — L570 Actinic keratosis: Secondary | ICD-10-CM | POA: Diagnosis not present

## 2020-01-31 DIAGNOSIS — M7130 Other bursal cyst, unspecified site: Secondary | ICD-10-CM | POA: Diagnosis not present

## 2020-02-03 DIAGNOSIS — R55 Syncope and collapse: Secondary | ICD-10-CM | POA: Diagnosis not present

## 2020-02-13 ENCOUNTER — Ambulatory Visit: Payer: Medicare Other | Attending: Internal Medicine

## 2020-02-13 DIAGNOSIS — Z23 Encounter for immunization: Secondary | ICD-10-CM | POA: Insufficient documentation

## 2020-02-13 NOTE — Progress Notes (Signed)
   Covid-19 Vaccination Clinic  Name:  Amy Fischer    MRN: [id number] DOB: 1929/11/21  02/13/2020  Amy Fischer was observed post Covid-19 immunization for 15 minutes without incidence. She was provided with Vaccine Information Sheet and instruction to access the V-Safe system.   Amy Fischer was instructed to call 911 with any severe reactions post vaccine: [name] Difficulty breathing  . Swelling of your face and throat  . A fast heartbeat  . A bad rash all over your body  . Dizziness and weakness    Immunizations Administered    Name Date Dose VIS Date Route   Pfizer COVID-19 Vaccine 02/13/2020 10:10 AM 0.3 mL 12/09/2019 Intramuscular   Manufacturer: [place]   Lot: [id number]   [place]: [id number]

## 2020-02-23 ENCOUNTER — Telehealth: Payer: Self-pay | Admitting: *Deleted

## 2020-02-23 DIAGNOSIS — Z9181 History of falling: Secondary | ICD-10-CM

## 2020-02-23 DIAGNOSIS — M810 Age-related osteoporosis without current pathological fracture: Secondary | ICD-10-CM

## 2020-02-23 DIAGNOSIS — R296 Repeated falls: Secondary | ICD-10-CM

## 2020-02-23 NOTE — Telephone Encounter (Signed)
Patient has an appointment with Dr Regis Bill 02/24/20.   Dexa ordered but will need to be scheduled If insurance approval is done by appointment, patient can get Prolia at office visit.

## 2020-02-23 NOTE — Telephone Encounter (Signed)
It is time for patient's Prolia injection.  Okay to order a Dexa?

## 2020-02-23 NOTE — Telephone Encounter (Signed)
Yes.  Please do.

## 2020-02-24 ENCOUNTER — Ambulatory Visit (INDEPENDENT_AMBULATORY_CARE_PROVIDER_SITE_OTHER): Payer: Medicare Other | Admitting: Internal Medicine

## 2020-02-24 ENCOUNTER — Other Ambulatory Visit: Payer: Self-pay

## 2020-02-24 ENCOUNTER — Encounter: Payer: Self-pay | Admitting: Internal Medicine

## 2020-02-24 VITALS — BP 124/76 | HR 85 | Temp 98.0°F | Wt 120.4 lb

## 2020-02-24 DIAGNOSIS — Z79899 Other long term (current) drug therapy: Secondary | ICD-10-CM | POA: Diagnosis not present

## 2020-02-24 DIAGNOSIS — M19041 Primary osteoarthritis, right hand: Secondary | ICD-10-CM

## 2020-02-24 DIAGNOSIS — M810 Age-related osteoporosis without current pathological fracture: Secondary | ICD-10-CM | POA: Diagnosis not present

## 2020-02-24 DIAGNOSIS — M19042 Primary osteoarthritis, left hand: Secondary | ICD-10-CM

## 2020-02-24 DIAGNOSIS — I2510 Atherosclerotic heart disease of native coronary artery without angina pectoris: Secondary | ICD-10-CM | POA: Diagnosis not present

## 2020-02-24 NOTE — Patient Instructions (Signed)
No change in medication   Make sure taking the vit d Nd b12.  Make appt for bone density  . Order has been placed .   Plan 6 months yearly visit   Or as needed .

## 2020-02-24 NOTE — Progress Notes (Signed)
This visit occurred during the SARS-CoV-2 public health emergency.  Safety protocols were in place, including screening questions prior to the visit, additional usage of staff PPE, and extensive cleaning of exam room while observing appropriate contact time as indicated for disinfecting solutions.    Chief Complaint  Patient presents with  . Follow-up    Pt is here for 6 month follow up and has no other concerns   . Medication Management    HPI: LEINA BAS 86 y.o. come in for Chronic disease management    No cv sx ocass racing     No falling  [place] in am .  And night .   Lives alone household of 1 self.   Dermatologist .   Dr. Ronnald Ramp recent treatment on face of a squamous cell type changes treated with topicals.  Broke 2 teeth  Gum line t giving .  He has got fixed.   r  Dip.  Joint sore at times  Dr  Rockey Situ her would come back if removed thi  Sees allergy and  Cards   Social isolation has been tought  But  Had card   group recently and liked this.  ROS: See pertinent positives and negatives per HPI.  Past Medical History:  Diagnosis Date  . Allergy   . Anemia   . Asymptomatic carotid artery stenosis    R ICA 40% stenosis on CTA 12/2011.  . Bladder polyps   . Cataract    BILATERAL-REMOVED  . CHF (congestive heart failure) ([place])   . Diverticulosis   . Elevated blood pressure 03/25/2011   Bp readings borderline today has hx of elvation  In office and ok at home   Not checke recently   She gfeels was elevated from anxiety    . Episodic recurrent vertigo    MRI Head 2003  . Esophageal spasm   . GERD (gastroesophageal reflux disease)   . Hepatic hemangioma   . History of hepatitis    unknown type  . Hyperlipidemia   . Hyperplastic colon polyp   . Hypothyroidism   . Intestinal metaplasia of gastric mucosa   . Osteoarthritis   . Osteoporosis   . Patellar fracture 07/13/2012  . Scapular fracture 03/31/2012   small from direct blow with trip    . Subclavian steal  syndrome    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; CTA 2013 revealed open graft    Family History  Problem Relation Age of Onset  . Hypertension Mother   . Stroke Mother   . Heart disease Father   . Colon cancer Neg Hx   . Neurofibromatosis Neg Hx   . Allergic rhinitis Neg Hx   . Asthma Neg Hx   . Angioedema Neg Hx   . Atopy Neg Hx   . Eczema Neg Hx   . Immunodeficiency Neg Hx   . Urticaria Neg Hx     Social History   Socioeconomic History  . Marital status: Widowed    Spouse name: Not on file  . Number of children: 4  . Years of education: Not on file  . Highest education level: Bachelor's degree (e.g., BA, AB, BS)  Occupational History  . Occupation: retired Pharmacist, hospital  Tobacco Use  . Smoking status: Former Smoker    Packs/day: 0.75    Years: 20.00    Pack years: 15.00    Quit date: 01/05/1991    Years since quitting: 29.1  . Smokeless tobacco: Never Used  Substance  and Sexual Activity  . Alcohol use: Yes    Alcohol/week: 4.0 standard drinks    Types: 4 Glasses of wine per week    Comment: socially  . Drug use: No  . Sexual activity: Not on file  Other Topics Concern  . Not on file  Social History Narrative   Widowed.  Lives alone in a one story home.  Has 4 children (3 living).  Retired first [employer].    HH of 1    No pets   Former smoker   Exercises regularly   Right handed   Social Determinants of Health   Financial Resource Strain:   . Difficulty of Paying Living Expenses: Not on file  Food Insecurity:   . Worried About Charity fundraiser in the Last Year: Not on file  . Ran Out of Food in the Last Year: Not on file  Transportation Needs:   . Lack of Transportation (Medical): Not on file  . Lack of Transportation (Non-Medical): Not on file  Physical Activity:   . Days of Exercise per Week: Not on file  . Minutes of Exercise per Session: Not on file  Stress:   . Feeling of Stress : Not on file  Social Connections:   . Frequency of  Communication with Friends and Family: Not on file  . Frequency of Social Gatherings with Friends and Family: Not on file  . Attends Religious Services: Not on file  . Active Member of Clubs or Organizations: Not on file  . Attends Archivist Meetings: Not on file  . Marital Status: Not on file    Outpatient Medications Prior to Visit  Medication Sig Dispense Refill  . albuterol (VENTOLIN HFA) 108 (90 Base) MCG/ACT inhaler Inhale 2 puffs into the lungs every 6 (six) hours as needed for wheezing or shortness of breath. 18 g 2  . amiodarone (PACERONE) 200 MG tablet TAKE 1 TABLET DAILY 90 tablet 3  . cephALEXin (KEFLEX) 500 MG capsule Take 4 capsules by mouth once 30-60 min prior to dental procedure. 4 capsule 0  . cholecalciferol (VITAMIN D) 1000 units tablet Take 1,000 Units by mouth daily.    . DiphenhydrAMINE HCl (BENADRYL PO) Take by mouth.    . ELIQUIS 2.5 MG TABS tablet TAKE 1 TABLET TWICE A DAY 180 tablet 4  . ezetimibe (ZETIA) 10 MG tablet TAKE 1 TABLET DAILY 90 tablet 3  . fish oil-omega-3 fatty acids 1000 MG capsule Take 1 g by mouth daily.     . folic acid (FOLVITE) 1 MG tablet TAKE 1 TABLET DAILY 90 tablet 3  . glucosamine-chondroitin 500-400 MG tablet Take 1 tablet by mouth daily.     [name] ipratropium (ATROVENT) 0.06 % nasal spray Place 1-2 sprays into both nostrils 2 (two) times daily as needed for rhinitis. 45 mL 5  . levothyroxine (SYNTHROID, LEVOTHROID) 100 MCG tablet TAKE 1 TABLET EVERY MORNING ON AN EMPTY STOMACH 90 tablet 3  . loratadine (CLARITIN) 10 MG tablet Take 1 tablet (10 mg total) by mouth daily. Take one tablet twice daily 60 tablet 5  . Melatonin 3 MG TABS Take 3 mg by mouth at bedtime.     . MULTIPLE VITAMIN PO Take 1 tablet by mouth daily. 50+ Senior Vitamin Daily    . omeprazole (PRILOSEC) 40 MG capsule TAKE 1 CAPSULE DAILY (Patient taking differently: TAKE 1 CAPSULE DAILY before dinner) 90 capsule 1  . Polyethyl Glycol-Propyl Glycol (SYSTANE OP) Place  1 drop into  both eyes 2 (two) times daily. Use 1-2 Drops in both eyes    . rosuvastatin (CRESTOR) 5 MG tablet TAKE ONE-HALF (1/2) TABLET DAILY 90 tablet 4  . SALINE NASAL SPRAY NA Place 1 spray into both nostrils 2 (two) times daily as needed (congestion).     . triamcinolone (NASACORT ALLERGY 24HR) 55 MCG/ACT AERO nasal inhaler Place 1 spray into the nose daily.    . vitamin B-12 (CYANOCOBALAMIN) 1000 MCG tablet Take 1,000 mcg by mouth daily.     No facility-administered medications prior to visit.     EXAM:  BP 124/76 (BP Location: Right Arm, Patient Position: Sitting, Cuff Size: Normal)   Pulse 85   Temp 98 F (36.7 C) (Temporal)   Wt 120 lb 6.4 oz (54.6 kg)   SpO2 95%   BMI 22.02 kg/m   Body mass index is 22.02 kg/m.  GENERAL: vitals reviewed and listed above, alert, oriented, appears well hydrated and in no acute distress has cane but independent walking  HEENT: atraumatic, conjunctiva  clear, no obvious abnormalities on inspection of external nose and ears OP : masked   Left face with ares of treated   Skin lesions  NECK: no obvious masses on inspection palpation  LUNGS: clear to auscultation bilaterally, no wheezes, rales or rhonchi, good air movement CV: HRRR, no clubbing cyanosis or  peripheral edema nl cap refill  MS: moves all extremities hand w sig  deformity mostly pip but some dip r  Index sore area  nodujle   PSYCH: pleasant and cooperative, no obvious depression or anxiety Lab Results  Component Value Date   WBC 5.9 01/17/2020   HGB 14.3 01/17/2020   HCT 43.8 01/17/2020   PLT 192 01/17/2020   GLUCOSE 75 01/17/2020   CHOL 154 01/17/2020   TRIG 100 01/17/2020   HDL 73 01/17/2020   LDLDIRECT 76.0 08/19/2019   LDLCALC 63 01/17/2020   ALT 15 01/17/2020   AST 19 01/17/2020   NA 143 01/17/2020   K 4.7 01/17/2020   CL 106 01/17/2020   CREATININE 0.86 01/17/2020   BUN 13 01/17/2020   CO2 25 01/17/2020   TSH 0.965 01/17/2020   INR 1.02 01/12/2016   HGBA1C  5.1 10/14/2016   BP Readings from Last 3 Encounters:  02/24/20 124/76  01/17/20 132/78  11/29/19 138/74   Lab reviewed with patient ASSESSMENT AND PLAN:  Discussed the following assessment and plan:  Osteoporosis, unspecified osteoporosis type, unspecified pathological fracture presence  Medication management  Arthritis of both hands Make sure taking  Take b12  Continue med  Waiting for  Insurance approval  Get dexa scan in interim    fortunately beginning to get out and socialize as   Isolation has been difficult on her .   No major change in health  And lab reviewed  -Patient advised to return or notify health care team  if  new concerns arise. Outside external source  DATA REVIEWED:   Total time on date  of service including record review ordering and plan of care:  32 minutes    Patient Instructions  No change in medication   Make sure taking the vit d Nd b12.  Make appt for bone density  . Order has been placed .   Plan 6 months yearly visit   Or as needed .   [name]. Mardene Lessig M.D.

## 2020-02-28 ENCOUNTER — Ambulatory Visit (INDEPENDENT_AMBULATORY_CARE_PROVIDER_SITE_OTHER)
Admission: RE | Admit: 2020-02-28 | Discharge: 2020-02-28 | Disposition: A | Discharge status: Home / Self Care | Payer: Medicare Other | Source: Ambulatory Visit

## 2020-02-28 ENCOUNTER — Other Ambulatory Visit: Payer: Self-pay

## 2020-02-28 DIAGNOSIS — Z9181 History of falling: Secondary | ICD-10-CM

## 2020-02-28 DIAGNOSIS — M810 Age-related osteoporosis without current pathological fracture: Secondary | ICD-10-CM | POA: Diagnosis not present

## 2020-02-28 DIAGNOSIS — R296 Repeated falls: Secondary | ICD-10-CM

## 2020-02-29 NOTE — Telephone Encounter (Signed)
If deductible is met,  There is no charge for Prolia.  Patient to call back and schedule her Prolia injection.

## 2020-02-29 NOTE — Progress Notes (Signed)
Spine results has improved  hipp minimally decreased  density  Please   continue with Prolia  injections

## 2020-03-02 ENCOUNTER — Other Ambulatory Visit: Payer: Self-pay

## 2020-03-05 ENCOUNTER — Ambulatory Visit (INDEPENDENT_AMBULATORY_CARE_PROVIDER_SITE_OTHER): Payer: Medicare Other | Admitting: *Deleted

## 2020-03-05 ENCOUNTER — Other Ambulatory Visit: Payer: Self-pay

## 2020-03-05 DIAGNOSIS — M810 Age-related osteoporosis without current pathological fracture: Secondary | ICD-10-CM

## 2020-03-05 MED ORDER — DENOSUMAB 60 MG/ML ~~LOC~~ SOSY
60.0000 mg | PREFILLED_SYRINGE | Freq: Once | SUBCUTANEOUS | Status: AC
  Administered 2020-03-05: 11:00:00 60 mg via SUBCUTANEOUS

## 2020-03-05 NOTE — Progress Notes (Signed)
Per orders of Dr. Regis Bill, injection of Prolia given by Zacarias Pontes. Patient tolerated injection well.

## 2020-03-15 ENCOUNTER — Other Ambulatory Visit: Payer: Self-pay | Admitting: Internal Medicine

## 2020-04-03 DIAGNOSIS — L570 Actinic keratosis: Secondary | ICD-10-CM | POA: Diagnosis not present

## 2020-04-03 DIAGNOSIS — Z85828 Personal history of other malignant neoplasm of skin: Secondary | ICD-10-CM | POA: Diagnosis not present

## 2020-04-20 ENCOUNTER — Other Ambulatory Visit: Payer: Self-pay | Admitting: Internal Medicine

## 2020-04-25 ENCOUNTER — Ambulatory Visit: Payer: Medicare Other | Admitting: Neurology

## 2020-04-26 ENCOUNTER — Telehealth: Payer: Medicare Other | Admitting: Neurology

## 2020-05-16 ENCOUNTER — Other Ambulatory Visit: Payer: Self-pay | Admitting: Physician Assistant

## 2020-06-05 ENCOUNTER — Telehealth: Payer: Self-pay | Admitting: Cardiology

## 2020-06-05 NOTE — Telephone Encounter (Signed)
*  STAT* If patient is at the pharmacy, call can be transferred to refill team.   1. Which medications need to be refilled? (please list name of each medication and dose if known) ELIQUIS 2.5 MG TABS tablet  2. Which pharmacy/location (including street and city if local pharmacy) is medication to be sent to? [facility] [place] [place]  3. Do they need a 30 day or 90 day supply? 90 day

## 2020-06-06 MED ORDER — APIXABAN 2.5 MG PO TABS: 2.5000 mg | ORAL_TABLET | Freq: Two times a day (BID) | ORAL | 1 refills | Status: DC

## 2020-06-06 NOTE — Telephone Encounter (Signed)
89 F 54.6 kg, SCr 0.86 (1/21) LOV Crenshaw 1/21

## 2020-06-18 ENCOUNTER — Telehealth: Payer: Self-pay | Admitting: Internal Medicine

## 2020-06-18 ENCOUNTER — Encounter: Payer: Self-pay | Admitting: Cardiology

## 2020-06-18 ENCOUNTER — Telehealth: Payer: Self-pay | Admitting: Allergy and Immunology

## 2020-06-18 ENCOUNTER — Other Ambulatory Visit: Payer: Self-pay | Admitting: Internal Medicine

## 2020-06-18 MED ORDER — LEVOTHYROXINE SODIUM 100 MCG PO TABS: ORAL_TABLET | ORAL | 1 refills | Status: DC

## 2020-06-18 NOTE — Telephone Encounter (Signed)
Patient called and said she keeps getting refills of ipratropium from [facility]. She said it is piling up and she doesn't need all of it. Could this prescription be stopped?

## 2020-06-18 NOTE — Telephone Encounter (Signed)
error 

## 2020-06-18 NOTE — Telephone Encounter (Signed)
Pt is out of her thyroid medication (levothyroxine 100 MCG) and [facility] told the pt that it would take them 2-3 days before they would have it sent to her and was told to call to see if it is okay for her to go without it that long.  Local Pharm:  Kristopher Oppenheim at [facility]  (Pt would like to have only 7 days from local since the mail order would [facility] overnight the remanding).

## 2020-06-18 NOTE — Addendum Note (Signed)
Addended byShanon Ace K on: 06/18/2020 05:11 PM   Modules accepted: Orders

## 2020-06-18 NOTE — Telephone Encounter (Signed)
Please advise if you are sending this or Dr. Stanford Breed. See telephone message. Patient is out and very concerned about this.

## 2020-06-18 NOTE — Telephone Encounter (Signed)
Please see message. I see that Dr. Stanford Breed checks her TSH. Are you still filling? OK to send some to [facility] and send 90 day to [facility]?

## 2020-06-18 NOTE — Telephone Encounter (Addendum)
[facility] and 7 days to [facility] [address]  . Her last tsh was normal range  Should be repeated necxt Jan 2022  Or as indicated  Lab Results  Component Value Date   TSH 0.965 01/17/2020

## 2020-06-19 NOTE — Telephone Encounter (Signed)
[facility] they said that she is not on auto refill bu they will make sure no others are mailed out to the pt tried calling pt to inform her of this but no one answered

## 2020-06-19 NOTE — Telephone Encounter (Signed)
Called patient and let her know that her refill has been sent for requested fills to both pharmacy locations. Patient verbalized an understanding.

## 2020-07-03 ENCOUNTER — Other Ambulatory Visit: Payer: Self-pay | Admitting: Internal Medicine

## 2020-08-01 ENCOUNTER — Telehealth: Payer: Self-pay | Admitting: Cardiology

## 2020-08-01 NOTE — Telephone Encounter (Signed)
LVM for patient to return call to get follow up scheduled with C[facility] from recall list 

## 2020-08-23 NOTE — Progress Notes (Signed)
Follow-up Visit   Date: 08/24/20   Amy Fischer MRN: [id number] DOB: 1929-06-09   Interim History: Amy Fischer is a 86 y.o. right-handed Caucasian female with paroxysmal atrial fibrillation (on Eliquis), CHF, GERD, hyperlipidemia, left subclavian steal syndrome, and vertigo returning to the clinic for follow-up of peripheral neuropathy.  The patient was accompanied to the clinic by self.  History of present illness: Since around 2015, she began having unsteadiness when walking and started to use a cane over the past year.  She has suffered several falls over the past few years, some which were mechanical (tripping over oxygen tubing, uneven pavement), others which were not mechanical.  Unfortunately, she has broken her left wrist, left knee, and pelvis with her falls. She is more off balance when walking in the yard, but not as much within her home.  She hand a shower stool for the bathroom, because she gets very unsteady when she closes her eyes.  She does have numbness/tingling of the feet and mid-calf, and feels as if she has cinder blocks on her feet.  Despite this, she remains quite active and lives an independent life.  She lives alone and does her own IADLs and ADLs.  She continues to drive and does not have difficulty manipulating the pedals.  She has contemplated moving into an independent living facility, but her children do not feel strongly about this. She has been on anticoagulation therapy for the past year for PAF.    She was previously evaluated at [facility] neurology by Dr. Jacelyn Grip in 2013 for vertigo. CT/A head and neck did not show evidence of intracranial stenosis.   UPDATE 08/24/2020:  She is here for 1 year follow-up.  Over the past year, she has notice mild progression of neuropathy in the feet and lower legs.  She has predominately numbness, no burning or tingling.  She suffered one fall while opening her car door.  She walks with a cane.  No problems with driving.   Despite her advanced age, she remains highly independent with ADLs and IADLs, including driving.  Medications:  Current Outpatient Medications on File Prior to Visit  Medication Sig Dispense Refill  . albuterol (VENTOLIN HFA) 108 (90 Base) MCG/ACT inhaler Inhale 2 puffs into the lungs every 6 (six) hours as needed for wheezing or shortness of breath. 18 g 2  . amiodarone (PACERONE) 200 MG tablet TAKE 1 TABLET DAILY 90 tablet 3  . apixaban (ELIQUIS) 2.5 MG TABS tablet Take 1 tablet (2.5 mg total) by mouth 2 (two) times daily. 180 tablet 1  . cholecalciferol (VITAMIN D) 1000 units tablet Take 1,000 Units by mouth daily.    . DiphenhydrAMINE HCl (BENADRYL PO) Take by mouth.    . ezetimibe (ZETIA) 10 MG tablet TAKE 1 TABLET DAILY 90 tablet 3  . fish oil-omega-3 fatty acids 1000 MG capsule Take 1 g by mouth daily.     . folic acid (FOLVITE) 1 MG tablet TAKE 1 TABLET DAILY 90 tablet 0  . glucosamine-chondroitin 500-400 MG tablet Take 1 tablet by mouth daily.     [name] ipratropium (ATROVENT) 0.06 % nasal spray Place 1-2 sprays into both nostrils 2 (two) times daily as needed for rhinitis. 45 mL 5  . levothyroxine (SYNTHROID) 100 MCG tablet TAKE 1 TABLET EVERY MORNING ON AN EMPTY STOMACH 7 tablet 1  . loratadine (CLARITIN) 10 MG tablet Take 1 tablet (10 mg total) by mouth daily. Take one tablet twice daily 60 tablet 5  .  Melatonin 3 MG TABS Take 3 mg by mouth at bedtime.     . MULTIPLE VITAMIN PO Take 1 tablet by mouth daily. 50+ Senior Vitamin Daily    . omeprazole (PRILOSEC) 40 MG capsule TAKE 1 CAPSULE DAILY (Patient taking differently: TAKE 1 CAPSULE DAILY before dinner) 90 capsule 1  . Polyethyl Glycol-Propyl Glycol (SYSTANE OP) Place 1 drop into both eyes 2 (two) times daily. Use 1-2 Drops in both eyes    . rosuvastatin (CRESTOR) 5 MG tablet TAKE ONE-HALF (1/2) TABLET DAILY 90 tablet 3  . SALINE NASAL SPRAY NA Place 1 spray into both nostrils 2 (two) times daily as needed (congestion).     .  triamcinolone (NASACORT ALLERGY 24HR) 55 MCG/ACT AERO nasal inhaler Place 1 spray into the nose daily.    . vitamin B-12 (CYANOCOBALAMIN) 1000 MCG tablet Take 1,000 mcg by mouth daily.     No current facility-administered medications on file prior to visit.    Allergies:  Allergies  Allergen Reactions  . Codeine Nausea And Vomiting  . Risedronate Sodium     Upset stomach. Could take Fosamax.  . Statins     Muscles hurt, can take low dose   . Tape Other (See Comments)    Blisters, Please use "paper" tape  . Amoxicillin-Pot Clavulanate Diarrhea    Not allergic  2014, Pt can take z pack only Not allergic  2014, Pt can take z pack only    Vital Signs:  BP 137/74 (BP Location: Left Arm, Patient Position: Sitting, Cuff Size: Small)   Pulse 79   Ht 5\' 2"  (1.575 m)   Wt 119 lb 9.6 oz (54.3 kg)   SpO2 94%   BMI 21.88 kg/m     Neurological Exam: MENTAL STATUS including orientation to time, place, person, recent and remote memory, attention span and concentration, language, and fund of knowledge is normal.  Speech is not dysarthric.  CRANIAL NERVES:    Face is symmetric.  MOTOR:  Motor strength is 5/5 in all extremities, except trace toe extension weakness on the right.  No atrophy, fasciculations or abnormal movements.  No pronator drift.  Tone is normal.    MSRs:  Reflexes are 2+/4 throughout, except absent bilateral Achilles reflexes.  SENSORY: Reduced sensation distal to mid-calf to temperature and pinprick bilaterally.  Vibration is absent distal to ankles.  COORDINATION/GAIT:  Normal finger-to- nose-finger.  Intact rapid alternating movements bilaterally.  Gait is assisted with a cane and appears stable.  Data: Lab Results  Component Value Date   TSH 0.965 01/17/2020   Lab Results  Component Value Date   HGBA1C 5.1 10/14/2016   Lab Results  Component Value Date   [id number] 08/19/2019     IMPRESSION/PLAN: Idiopathic peripheral neuropathy manifesting with  numbness in the feet and lower legs and sensory ataxia.  Clinically stable  - Continue to use cane and walker   - Take extra caution on uneven ground  - Check feet daily  Return to clinic in 1 year   Thank you for allowing me to participate in patient's care.  If I can answer any additional questions, I would be pleased to do so.    Sincerely,    Alichia Alridge K. Posey Pronto, DO

## 2020-08-24 ENCOUNTER — Encounter: Payer: Self-pay | Admitting: Neurology

## 2020-08-24 ENCOUNTER — Other Ambulatory Visit: Payer: Self-pay

## 2020-08-24 ENCOUNTER — Ambulatory Visit (INDEPENDENT_AMBULATORY_CARE_PROVIDER_SITE_OTHER): Payer: Medicare Other | Admitting: Neurology

## 2020-08-24 VITALS — BP 137/74 | HR 79 | Ht 62.0 in | Wt 119.6 lb

## 2020-08-24 DIAGNOSIS — I2510 Atherosclerotic heart disease of native coronary artery without angina pectoris: Secondary | ICD-10-CM

## 2020-08-24 DIAGNOSIS — G629 Polyneuropathy, unspecified: Secondary | ICD-10-CM | POA: Diagnosis not present

## 2020-08-24 NOTE — Patient Instructions (Signed)
It was great to see you today!  Return to clinic in 1 year 

## 2020-08-27 ENCOUNTER — Other Ambulatory Visit: Payer: Self-pay

## 2020-08-27 NOTE — Progress Notes (Signed)
Chief Complaint  Patient presents with  . Follow-up    Doing well  . Medication Management  . Hyperlipidemia    HPI: Amy Fischer 86 y.o. come in for Chronic disease management  Yearly visit  HLD  Taking med  THYroid no change Bone health had a fall  Ar down fell  back ward on left elbows  Had bruise lumpo but no head injury bruised   Uses  cane  When out of home  Has been on prolia  PPI   [employer] hh of 1 goes now to [employer]  TAD neg Activity stays home avoiding covid  But some activity   Wants to get back to exercise walkng in water    ROS: See pertinent positives and negatives per HPI. No bruising bleeding   Saw dr Harl Favor and stable  Taking b12  Oral  Masks make breathing hard so uses insert .  Vision off sometimes   runy nose the same   Past Medical History:  Diagnosis Date  . Allergy   . Anemia   . Asymptomatic carotid artery stenosis    R ICA 40% stenosis on CTA 12/2011.  . Bladder polyps   . Cataract    BILATERAL-REMOVED  . CHF (congestive heart failure) ([place])   . Diverticulosis   . Elevated blood pressure 03/25/2011   Bp readings borderline today has hx of elvation  In office and ok at home   Not checke recently   She gfeels was elevated from anxiety    . Episodic recurrent vertigo    MRI Head 2003  . Esophageal spasm   . GERD (gastroesophageal reflux disease)   . Hepatic hemangioma   . History of hepatitis    unknown type  . Hyperlipidemia   . Hyperplastic colon polyp   . Hypothyroidism   . Intestinal metaplasia of gastric mucosa   . Osteoarthritis   . Osteoporosis   . Patellar fracture 07/13/2012  . Scapular fracture 03/31/2012   small from direct blow with trip    . Subclavian steal syndrome    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; CTA 2013 revealed open graft    Family History  Problem Relation Age of Onset  . Hypertension Mother   . Stroke Mother   . Heart disease Father   . Colon cancer Neg Hx   . Neurofibromatosis Neg Hx   .  Allergic rhinitis Neg Hx   . Asthma Neg Hx   . Angioedema Neg Hx   . Atopy Neg Hx   . Eczema Neg Hx   . Immunodeficiency Neg Hx   . Urticaria Neg Hx     Social History   Socioeconomic History  . Marital status: Widowed    Spouse name: Not on file  . Number of children: 4  . Years of education: Not on file  . Highest education level: Bachelor's degree (e.g., BA, AB, BS)  Occupational History  . Occupation: retired Pharmacist, hospital  Tobacco Use  . Smoking status: Former Smoker    Packs/day: 0.75    Years: 20.00    Pack years: 15.00    Quit date: 01/05/1991    Years since quitting: 29.6  . Smokeless tobacco: Never Used  Vaping Use  . Vaping Use: Never used  Substance and Sexual Activity  . Alcohol use: Yes    Alcohol/week: 4.0 standard drinks    Types: 4 Glasses of wine per week    Comment: socially  . Drug  use: No  . Sexual activity: Not on file  Other Topics Concern  . Not on file  Social History Narrative   Widowed.  Lives alone in a one story home.  Has 4 children (3 living).  Retired first [employer]    HH of 1    No pets   Former smoker   Exercises regularly   Right handed   Social Determinants of Health   Financial Resource Strain:   . Difficulty of Paying Living Expenses: Not on file  Food Insecurity:   . Worried About Charity fundraiser in the Last Year: Not on file  . Ran Out of Food in the Last Year: Not on file  Transportation Needs:   . Lack of Transportation (Medical): Not on file  . Lack of Transportation (Non-Medical): Not on file  Physical Activity:   . Days of Exercise per Week: Not on file  . Minutes of Exercise per Session: Not on file  Stress:   . Feeling of Stress : Not on file  Social Connections:   . Frequency of Communication with Friends and Family: Not on file  . Frequency of Social Gatherings with Friends and Family: Not on file  . Attends Religious Services: Not on file  . Active Member of Clubs or Organizations: Not on file  .  Attends Archivist Meetings: Not on file  . Marital Status: Not on file    Outpatient Medications Prior to Visit  Medication Sig Dispense Refill  . albuterol (VENTOLIN HFA) 108 (90 Base) MCG/ACT inhaler Inhale 2 puffs into the lungs every 6 (six) hours as needed for wheezing or shortness of breath. 18 g 2  . amiodarone (PACERONE) 200 MG tablet TAKE 1 TABLET DAILY 90 tablet 3  . apixaban (ELIQUIS) 2.5 MG TABS tablet Take 1 tablet (2.5 mg total) by mouth 2 (two) times daily. 180 tablet 1  . cholecalciferol (VITAMIN D) 1000 units tablet Take 1,000 Units by mouth daily.    . DiphenhydrAMINE HCl (BENADRYL PO) Take by mouth.    . ezetimibe (ZETIA) 10 MG tablet TAKE 1 TABLET DAILY 90 tablet 3  . fish oil-omega-3 fatty acids 1000 MG capsule Take 1 g by mouth daily.     . folic acid (FOLVITE) 1 MG tablet TAKE 1 TABLET DAILY 90 tablet 0  . glucosamine-chondroitin 500-400 MG tablet Take 1 tablet by mouth daily.     [name] ipratropium (ATROVENT) 0.06 % nasal spray Place 1-2 sprays into both nostrils 2 (two) times daily as needed for rhinitis. 45 mL 5  . levothyroxine (SYNTHROID) 100 MCG tablet TAKE 1 TABLET EVERY MORNING ON AN EMPTY STOMACH 7 tablet 1  . loratadine (CLARITIN) 10 MG tablet Take 1 tablet (10 mg total) by mouth daily. Take one tablet twice daily 60 tablet 5  . Melatonin 3 MG TABS Take 3 mg by mouth at bedtime.     . MULTIPLE VITAMIN PO Take 1 tablet by mouth daily. 50+ Senior Vitamin Daily    . omeprazole (PRILOSEC) 40 MG capsule TAKE 1 CAPSULE DAILY (Patient taking differently: TAKE 1 CAPSULE DAILY before dinner) 90 capsule 1  . Polyethyl Glycol-Propyl Glycol (SYSTANE OP) Place 1 drop into both eyes 2 (two) times daily. Use 1-2 Drops in both eyes    . rosuvastatin (CRESTOR) 5 MG tablet TAKE ONE-HALF (1/2) TABLET DAILY 90 tablet 3  . SALINE NASAL SPRAY NA Place 1 spray into both nostrils 2 (two) times daily as needed (congestion).     [name]  triamcinolone (NASACORT ALLERGY 24HR) 55 MCG/ACT  AERO nasal inhaler Place 1 spray into the nose daily.    . vitamin B-12 (CYANOCOBALAMIN) 1000 MCG tablet Take 1,000 mcg by mouth daily.     No facility-administered medications prior to visit.     EXAM:  BP 138/74   Pulse 80   Temp 97.7 F (36.5 C) (Oral)   Ht 5\' 2"  (1.575 m)   Wt 120 lb 3.2 oz (54.5 kg)   SpO2 96%   BMI 21.98 kg/m   Body mass index is 21.98 kg/m.  GENERAL: vitals reviewed and listed above, alert, oriented, appears well hydrated and in no acute distress HEENT: atraumatic, conjunctiva  clear, no obvious abnormalities on inspection of external nose and ears OP :masked  NECK: no obvious masses on inspection palpation  LUNGS: clear to auscultation bilaterally, no wheezes, rales or rhonchi, CV: , no clubbing cyanosis or  peripheral edema nl cap refill  MS: moves all extremities hands with sig finger arthritis   Feet lear no edema  Left elbow full rom faded bruised area   PSYCH: pleasant and cooperative, no obvious depression or anxiety cognition appears normal  Lab Results  Component Value Date   WBC 5.9 01/17/2020   HGB 14.3 01/17/2020   HCT 43.8 01/17/2020   PLT 192 01/17/2020   GLUCOSE 75 01/17/2020   CHOL 154 01/17/2020   TRIG 100 01/17/2020   HDL 73 01/17/2020   LDLDIRECT 76.0 08/19/2019   LDLCALC 63 01/17/2020   ALT 15 01/17/2020   AST 19 01/17/2020   NA 143 01/17/2020   K 4.7 01/17/2020   CL 106 01/17/2020   CREATININE 0.86 01/17/2020   BUN 13 01/17/2020   CO2 25 01/17/2020   TSH 0.965 01/17/2020   INR 1.02 01/12/2016   HGBA1C 5.1 10/14/2016   BP Readings from Last 3 Encounters:  08/28/20 138/74  08/24/20 137/74  02/24/20 124/76    ASSESSMENT AND PLAN:  Discussed the following assessment and plan:  Medication management - Plan: Basic metabolic panel, CBC with Differential/Platelet, Hepatic function panel, Lipid panel, TSH, Vitamin B12  Hyperlipidemia, unspecified hyperlipidemia type - Plan: Basic metabolic panel, CBC with  Differential/Platelet, Hepatic function panel, Lipid panel, TSH, Vitamin B12  Osteoporosis, unspecified osteoporosis type, unspecified pathological fracture presence - no fx with fall  - Plan: Basic metabolic panel, CBC with Differential/Platelet, Hepatic function panel, Lipid panel, TSH, Vitamin B12  Arthritis of both hands - Plan: Basic metabolic panel, CBC with Differential/Platelet, Hepatic function panel, Lipid panel, TSH, Vitamin B12  Low vitamin B12 level - Plan: Basic metabolic panel, CBC with Differential/Platelet, Hepatic function panel, Lipid panel, TSH, Vitamin B12  S/P MVR (mitral valve repair) - Plan: Basic metabolic panel, CBC with Differential/Platelet, Hepatic function panel, Lipid panel, TSH, Vitamin B12  Neuropathy - Plan: Basic metabolic panel, CBC with Differential/Platelet, Hepatic function panel, Lipid panel, TSH, Vitamin B12 Labs done jan  21    Plan for update then   Disc vaccines and timing  Fall risk   34 minutes  -Patient advised to return or notify health care team  if  new concerns arise.  Patient Instructions  continue to work on balance   Try taking the dissolvable b12  ( sublingual at next   [facility])    Get lab appt and then rov in January or thereabouts '  Booster covid at 8 months   Flu  Shot    In [place] or October       Ryenne Lynam K.  Laronica Bhagat M.D.

## 2020-08-28 ENCOUNTER — Ambulatory Visit (INDEPENDENT_AMBULATORY_CARE_PROVIDER_SITE_OTHER): Payer: Medicare Other | Admitting: Internal Medicine

## 2020-08-28 ENCOUNTER — Telehealth: Payer: Self-pay | Admitting: *Deleted

## 2020-08-28 ENCOUNTER — Encounter: Payer: Self-pay | Admitting: Internal Medicine

## 2020-08-28 VITALS — BP 138/74 | HR 80 | Temp 97.7°F | Ht 62.0 in | Wt 120.2 lb

## 2020-08-28 DIAGNOSIS — M810 Age-related osteoporosis without current pathological fracture: Secondary | ICD-10-CM | POA: Diagnosis not present

## 2020-08-28 DIAGNOSIS — Z7901 Long term (current) use of anticoagulants: Secondary | ICD-10-CM

## 2020-08-28 DIAGNOSIS — Z79899 Other long term (current) drug therapy: Secondary | ICD-10-CM | POA: Diagnosis not present

## 2020-08-28 DIAGNOSIS — G629 Polyneuropathy, unspecified: Secondary | ICD-10-CM

## 2020-08-28 DIAGNOSIS — E785 Hyperlipidemia, unspecified: Secondary | ICD-10-CM

## 2020-08-28 DIAGNOSIS — E538 Deficiency of other specified B group vitamins: Secondary | ICD-10-CM | POA: Diagnosis not present

## 2020-08-28 DIAGNOSIS — Z9889 Other specified postprocedural states: Secondary | ICD-10-CM

## 2020-08-28 DIAGNOSIS — M19042 Primary osteoarthritis, left hand: Secondary | ICD-10-CM

## 2020-08-28 DIAGNOSIS — M19041 Primary osteoarthritis, right hand: Secondary | ICD-10-CM | POA: Diagnosis not present

## 2020-08-28 DIAGNOSIS — Z9181 History of falling: Secondary | ICD-10-CM | POA: Diagnosis not present

## 2020-08-28 DIAGNOSIS — I2510 Atherosclerotic heart disease of native coronary artery without angina pectoris: Secondary | ICD-10-CM

## 2020-08-28 NOTE — Patient Instructions (Addendum)
continue to work on balance   Try taking the dissolvable b12  ( sublingual at next   [facility])    Get lab appt and then rov in January or thereabouts '  Booster covid at 8 months   Flu  Shot    In [place] or October

## 2020-08-28 NOTE — Telephone Encounter (Signed)
Patient here for an office visit requesting her Prolia injection. Next injection due after 09/06/20 Insurance verification started.

## 2020-08-29 ENCOUNTER — Other Ambulatory Visit: Payer: Self-pay

## 2020-08-29 ENCOUNTER — Ambulatory Visit (INDEPENDENT_AMBULATORY_CARE_PROVIDER_SITE_OTHER): Payer: Medicare Other

## 2020-08-29 DIAGNOSIS — Z0000 Encounter for general adult medical examination without abnormal findings: Secondary | ICD-10-CM | POA: Diagnosis not present

## 2020-08-29 NOTE — Progress Notes (Signed)
Virtual Visit via Telephone Note  I connected with  Amy Fischer on 08/29/20 at  3:15 PM EDT by telephone and verified that I am speaking with the correct person using two identifiers.  Medicare Annual Wellness visit completed telephonically due to Covid-19 pandemic.   Persons participating in this call: This Health Coach and this patient.   Location: Patient: Home Provider: Office   I discussed the limitations, risks, security and privacy concerns of performing an evaluation and management service by telephone and the availability of in person appointments. The patient expressed understanding and agreed to proceed.  Unable to perform video visit due to video visit attempted and failed and/or patient does not have video capability.   Some vital signs may be absent or patient reported.   Willette Brace, LPN    Subjective:   Amy Fischer is a 86 y.o. female who presents for Medicare Annual (Subsequent) preventive examination.  Review of Systems     Cardiac Risk Factors include: advanced age (>62men, >33 women);dyslipidemia;hypertension     Objective:    There were no vitals filed for this visit. There is no height or weight on file to calculate BMI.  Advanced Directives 08/29/2020 08/24/2020 07/25/2019 07/21/2019 06/12/2016 05/14/2016 02/16/2016  Does Patient Have a Medical Advance Directive? Yes No Yes No Yes No Yes  Type of Advance Directive - - - - Living will - -  Does patient want to make changes to medical advance directive? Yes (MAU/Ambulatory/Procedural Areas - Information given) - - - No - Patient declined - -  Copy of [place] in Chart? - - - - No - copy requested - -  Would patient like information on creating a medical advance directive? - - - - - No - patient declined information -  Pre-existing out of facility DNR order (yellow form or pink MOST form) - - - - - - -    Current Medications (verified) Outpatient Encounter Medications as of  08/29/2020  Medication Sig  . albuterol (VENTOLIN HFA) 108 (90 Base) MCG/ACT inhaler Inhale 2 puffs into the lungs every 6 (six) hours as needed for wheezing or shortness of breath.  [name] amiodarone (PACERONE) 200 MG tablet TAKE 1 TABLET DAILY  . apixaban (ELIQUIS) 2.5 MG TABS tablet Take 1 tablet (2.5 mg total) by mouth 2 (two) times daily.  . cholecalciferol (VITAMIN D) 1000 units tablet Take 1,000 Units by mouth daily.  . DiphenhydrAMINE HCl (BENADRYL PO) Take by mouth.  . ezetimibe (ZETIA) 10 MG tablet TAKE 1 TABLET DAILY  . fish oil-omega-3 fatty acids 1000 MG capsule Take 1 g by mouth daily.   . folic acid (FOLVITE) 1 MG tablet TAKE 1 TABLET DAILY  . glucosamine-chondroitin 500-400 MG tablet Take 1 tablet by mouth daily.   [name] ipratropium (ATROVENT) 0.06 % nasal spray Place 1-2 sprays into both nostrils 2 (two) times daily as needed for rhinitis.  [name] levothyroxine (SYNTHROID) 100 MCG tablet TAKE 1 TABLET EVERY MORNING ON AN EMPTY STOMACH  . Melatonin 3 MG TABS Take 3 mg by mouth at bedtime.   . MULTIPLE VITAMIN PO Take 1 tablet by mouth daily. 50+ Senior Vitamin Daily  . Polyethyl Glycol-Propyl Glycol (SYSTANE OP) Place 1 drop into both eyes 2 (two) times daily. Use 1-2 Drops in both eyes  . rosuvastatin (CRESTOR) 5 MG tablet TAKE ONE-HALF (1/2) TABLET DAILY  . SALINE NASAL SPRAY NA Place 1 spray into both nostrils 2 (two) times daily as needed (congestion).   [name]  triamcinolone (NASACORT ALLERGY 24HR) 55 MCG/ACT AERO nasal inhaler Place 1 spray into the nose daily.  . vitamin B-12 (CYANOCOBALAMIN) 1000 MCG tablet Take 1,000 mcg by mouth daily.  [name] omeprazole (PRILOSEC) 40 MG capsule TAKE 1 CAPSULE DAILY (Patient not taking: Reported on 08/29/2020)  . [DISCONTINUED] loratadine (CLARITIN) 10 MG tablet Take 1 tablet (10 mg total) by mouth daily. Take one tablet twice daily (Patient not taking: Reported on 08/29/2020)   No facility-administered encounter medications on file as of 08/29/2020.    Allergies  (verified) Codeine, Risedronate sodium, Statins, Tape, and Amoxicillin-pot clavulanate   History: Past Medical History:  Diagnosis Date  . Allergy   . Anemia   . Asymptomatic carotid artery stenosis    R ICA 40% stenosis on CTA 12/2011.  . Bladder polyps   . Cataract    BILATERAL-REMOVED  . CHF (congestive heart failure) ([place])   . Diverticulosis   . Elevated blood pressure 03/25/2011   Bp readings borderline today has hx of elvation  In office and ok at home   Not checke recently   She gfeels was elevated from anxiety    . Episodic recurrent vertigo    MRI Head 2003  . Esophageal spasm   . GERD (gastroesophageal reflux disease)   . Hepatic hemangioma   . History of hepatitis    unknown type  . Hyperlipidemia   . Hyperplastic colon polyp   . Hypothyroidism   . Intestinal metaplasia of gastric mucosa   . Osteoarthritis   . Osteoporosis   . Patellar fracture 07/13/2012  . Scapular fracture 03/31/2012   small from direct blow with trip    . Subclavian steal syndrome    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; CTA 2013 revealed open graft   Past Surgical History:  Procedure Laterality Date  . APPENDECTOMY    . CARDIAC CATHETERIZATION N/A 10/04/2015   Procedure: Right/Left Heart Cath and Coronary Angiography;  Surgeon: Belva Crome, MD;  Location: [facility];  Service: Cardiovascular;  Laterality: N/A;  . CAROTID-SUBCLAVIAN BYPASS GRAFT Left 1999   for [place] steal syndrome  . CHOLECYSTECTOMY    . COLONOSCOPY    . CYSTECTOMY Left    hand  . ELBOW SURGERY Left   . KNEE SURGERY Left 2014  . MITRAL VALVE REPAIR N/A 10/10/2015   Procedure: MITRAL VALVE REPAIR (MVR) WITH SIZE 28 CARPENTIER-EDWARDS PHYSIO II ANNULOPLASTY RING;  Surgeon: [name], MD;  Location: [facility];  Service: Open Heart Surgery;  Laterality: N/A;  . ROTATOR CUFF REPAIR Left   . TEAR DUCT PROBING  07/2014  . TEE WITHOUT CARDIOVERSION N/A 10/03/2015   Procedure: TRANSESOPHAGEAL ECHOCARDIOGRAM  (TEE);  Surgeon: Larey Dresser, MD;  Location: [facility];  Service: Cardiovascular;  Laterality: N/A;  . TEE WITHOUT CARDIOVERSION N/A 10/10/2015   Procedure: TRANSESOPHAGEAL ECHOCARDIOGRAM (TEE);  Surgeon: [name], MD;  Location: [facility];  Service: Open Heart Surgery;  Laterality: N/A;  . TONSILLECTOMY    . TUBAL LIGATION     Family History  Problem Relation Age of Onset  . Hypertension Mother   . Stroke Mother   . Heart disease Father   . Colon cancer Neg Hx   . Neurofibromatosis Neg Hx   . Allergic rhinitis Neg Hx   . Asthma Neg Hx   . Angioedema Neg Hx   . Atopy Neg Hx   . Eczema Neg Hx   . Immunodeficiency Neg Hx   . Urticaria Neg  Hx    Social History   Socioeconomic History  . Marital status: Widowed    Spouse name: Not on file  . Number of children: 4  . Years of education: Not on file  . Highest education level: Bachelor's degree (e.g., BA, AB, BS)  Occupational History  . Occupation: retired Pharmacist, hospital  Tobacco Use  . Smoking status: Former Smoker    Packs/day: 0.75    Years: 20.00    Pack years: 15.00    Quit date: 01/05/1991    Years since quitting: 29.6  . Smokeless tobacco: Never Used  Vaping Use  . Vaping Use: Never used  Substance and Sexual Activity  . Alcohol use: Yes    Alcohol/week: 4.0 standard drinks    Types: 4 Glasses of wine per week    Comment: socially  . Drug use: No  . Sexual activity: Not on file  Other Topics Concern  . Not on file  Social History Narrative   Widowed.  Lives alone in a one story home.  Has 4 children (3 living).  Retired first [employer]    HH of 1    No pets   Former smoker   Exercises regularly   Right handed   Social Determinants of Health   Financial Resource Strain: Low Risk   . Difficulty of Paying Living Expenses: Not hard at all  Food Insecurity: No Food Insecurity  . Worried About Charity fundraiser in the Last Year: Never true  . Ran Out of Food in the Last Year: Never true    Transportation Needs: No Transportation Needs  . Lack of Transportation (Medical): No  . Lack of Transportation (Non-Medical): No  Physical Activity: Inactive  . Days of Exercise per Week: 0 days  . Minutes of Exercise per Session: 0 min  Stress: No Stress Concern Present  . Feeling of Stress : Only a little  Social Connections: Moderately Integrated  . Frequency of Communication with Friends and Family: More than three times a week  . Frequency of Social Gatherings with Friends and Family: Three times a week  . Attends Religious Services: More than 4 times per year  . Active Member of Clubs or Organizations: Yes  . Attends Archivist Meetings: 1 to 4 times per year  . Marital Status: Widowed    Tobacco Counseling Counseling given: Not Answered   Clinical Intake:  Pre-visit preparation completed: Yes  Pain : No/denies pain     BMI - recorded: 21.88 Nutritional Status: BMI of 19-24  Normal Nutritional Risks: None Diabetes: No  How often do you need to have someone help you when you read instructions, pamphlets, or other written materials from your doctor or pharmacy?: 1 - Never  Diabetic?No  Interpreter Needed?: No  Information entered by :: Charlott Rakes, LPN   Activities of Daily Living In your present state of health, do you have any difficulty performing the following activities: 08/29/2020  Hearing? N  Vision? N  Difficulty concentrating or making decisions? N  Walking or climbing stairs? N  Dressing or bathing? N  Doing errands, shopping? N  Preparing Food and eating ? N  Using the Toilet? N  In the past six months, have you accidently leaked urine? Y  Comment leak and uses a panty liner for accidents  Do you have problems with loss of bowel control? N  Managing your Medications? N  Managing your Finances? N  Housekeeping or managing your Housekeeping? N  Some recent  data might be hidden    Patient Care Team: Panosh, [name], MD as PCP -  General Stanford Breed, Denice Bors, MD as PCP - Cardiology (Cardiology) Newton Pigg, MD (Obstetrics and Gynecology) Paralee Cancel, MD (Orthopedic Surgery) Jacelyn Pi, MD as Attending Physician (Endocrinology) Everitt Amber, MD as Consulting Physician (Ophthalmology) Stanford Breed Denice Bors, MD as Consulting Physician (Cardiology) [name], MD as Consulting Physician (Cardiothoracic Surgery) Gala Romney, Delena Serve, DO as Consulting Physician (Neurology) Alda Berthold, DO as Consulting Physician (Neurology)  Indicate any recent Medical Services you may have received from other than [facility] providers in the past year (date may be approximate).     Assessment:   This is a routine wellness examination for Amy Fischer.  Hearing/Vision screen  Hearing Screening   125Hz  250Hz  500Hz  1000Hz  2000Hz  3000Hz  4000Hz  6000Hz  8000Hz   Right ear:           Left ear:           Comments: Pt denies any hearing difficulty  Vision Screening Comments: Follows up annually with Dr Annamaria Boots and Dr Gershon Crane  Dietary issues and exercise activities discussed: Current Exercise Habits: Home exercise routine, Type of exercise: Other - see comments (stepper at home), Time (Minutes): 20 (about 300 steps), Frequency (Times/Week): 3, Weekly Exercise (Minutes/Week): 60  Goals    . Exercise 150 minutes per week (moderate activity)     Plan is to continue w PT and then on to cardiac rehab LT plan is to fup with structured class  May try [facility] or [employer] if they have exercise program and students that work with older adults; Will get the PT recommendations prior to moving on or stay on with [facility] "graduates"     . Patient Stated     Get more exercise in       Depression Screen PHQ 2/9 Scores 08/29/2020 02/24/2020 11/05/2018 05/14/2017 04/27/2017 03/18/2017 02/05/2017  PHQ - 2 Score 0 0 2 0 0 0 0  PHQ- 9 Score - - - - - - -    Fall Risk Fall Risk  08/29/2020 08/24/2020 07/21/2019 01/24/2019 11/05/2018  Falls in the  past year? 1 1 0 1 1  Comment - - - - -  Number falls in past yr: 1 0 0 1 0  Injury with Fall? 0 0 0 0 1  Risk Factor Category  - - - - -  Risk for fall due to : Impaired balance/gait;Impaired mobility;Impaired vision - - Impaired balance/gait;Impaired mobility -  Follow up Falls prevention discussed - - Falls evaluation completed;Education provided;Falls prevention discussed -  Comment - - - - -    Any stairs in or around the home? No  If so, are there any without handrails? No  Home free of loose throw rugs in walkways, pet beds, electrical cords, etc? Yes  Adequate lighting in your home to reduce risk of falls? Yes   ASSISTIVE DEVICES UTILIZED TO PREVENT FALLS:  Life alert? No  Use of a cane, walker or w/c? Yes  Grab bars in the bathroom? Yes  Shower chair or bench in shower? Yes  Elevated toilet seat or a handicapped toilet? Yes   TIMED UP AND GO:  Was the test performed? No .    Cognitive Function: MMSE - Mini Mental State Exam 08/08/2016  Not completed: (No Data)     6CIT Screen 08/29/2020  What Year? 0 points  What month? 0 points  Count back from 20 0 points  Months in reverse 0 points  Repeat phrase 0 points    Immunizations Immunization History  Administered Date(s) Administered  . Influenza Split 10/20/2013  . Influenza Whole 12/29/2001, 11/09/2007, 09/19/2008, 09/24/2010  . Influenza, High Dose Seasonal PF 10/31/2015, 10/06/2016, 09/29/2017, 09/30/2018  . Influenza-Unspecified 09/28/2014  . PFIZER SARS-COV-2 Vaccination 01/22/2020, 02/13/2020  . Pneumococcal Conjugate-13 07/26/2014  . Pneumococcal Polysaccharide-23 09/18/2009  . Td 04/09/1999  . Tdap 09/23/2011  . Zoster 12/29/2010  . Zoster Recombinat (Shingrix) 09/17/2018, 02/02/2019    TDAP status: Up to date Flu Vaccine status: Up to date Pneumococcal vaccine status: Up to date Covid-19 vaccine status: Completed vaccines  Qualifies for Shingles Vaccine? Yes   Zostavax completed Yes     Shingrix Completed?: Yes  Screening Tests Health Maintenance  Topic Date Due  . INFLUENZA VACCINE  07/29/2020  . TETANUS/TDAP  09/22/2021  . DEXA SCAN  Completed  . COVID-19 Vaccine  Completed  . PNA vac Low Risk Adult  Completed    Health Maintenance  Health Maintenance Due  Topic Date Due  . INFLUENZA VACCINE  07/29/2020    Colorectal cancer screening: No longer required.  Mammogram status: No longer required.  Bone Density status: Completed 02/28/20. Results reflect: Bone density results: OSTEOPOROSIS. Repeat every 2 years.   Additional Screening:    Vision Screening: Recommended annual ophthalmology exams for early detection of glaucoma and other disorders of the eye. Is the patient up to date with their annual eye exam?  Yes  Who is the provider or what is the name of the office in which the patient attends annual eye exams? Dr Gershon Crane and Dr young  Dental Screening: Recommended annual dental exams for proper oral hygiene  Community Resource Referral / Chronic Care Management: CRR required this visit?  No   CCM required this visit?  No      Plan:     I have personally reviewed and noted the following in the patient's chart:   . Medical and social history . Use of alcohol, tobacco or illicit drugs  . Current medications and supplements . Functional ability and status . Nutritional status . Physical activity . Advanced directives . List of other physicians . Hospitalizations, surgeries, and ER visits in previous 12 months . Vitals . Screenings to include cognitive, depression, and falls . Referrals and appointments  In addition, I have reviewed and discussed with patient certain preventive protocols, quality metrics, and best practice recommendations. A written personalized care plan for preventive services as well as general preventive health recommendations were provided to patient.     Willette Brace, LPN   05/03/3892   Nurse Notes:  None

## 2020-08-29 NOTE — Patient Instructions (Addendum)
Amy Fischer , Thank you for taking time to come for your Medicare Wellness Visit. I appreciate your ongoing commitment to your health goals. Please review the following plan we discussed and let me know if I can assist you in the future.   Screening Up to date: Colonoscopy: No longer required Mammogram: No longer required Bone Density: Done 02/28/20 Recommended yearly ophthalmology/optometry visit for glaucoma screening and checkup Recommended yearly dental visit for hygiene and checkup  Vaccinations: Influenza vaccine: Up to date Pneumococcal vaccine: Up to date Tdap vaccine: Up to date Shingles vaccine: Completed  09/17/18 & 02/02/19 Covid-19:Completed 1/24 & 02/13/20  Advanced directives: Advance directive discussed with you today. I have provided a copy for you to complete at home and have notarized. Once this is complete please bring a copy in to our office so we can scan it into your chart.   Conditions/risks identified: Get more exercise in  Next appointment: Follow up in one year for your annual wellness visit    Preventive Care 65 Years and Older, Female Preventive care refers to lifestyle choices and visits with your health care provider that can promote health and wellness. What does preventive care include?  A yearly physical exam. This is also called an annual well check.  Dental exams once or twice a year.  Routine eye exams. Ask your health care provider how often you should have your eyes checked.  Personal lifestyle choices, including:  Daily care of your teeth and gums.  Regular physical activity.  Eating a healthy diet.  Avoiding tobacco and drug use.  Limiting alcohol use.  Practicing safe sex.  Taking low-dose aspirin every day.  Taking vitamin and mineral supplements as recommended by your health care provider. What happens during an annual well check? The services and screenings done by your health care provider during your annual well check will  depend on your age, overall health, lifestyle risk factors, and family history of disease. Counseling  Your health care provider may ask you questions about your:  Alcohol use.  Tobacco use.  Drug use.  Emotional well-being.  Home and relationship well-being.  Sexual activity.  Eating habits.  History of falls.  Memory and ability to understand (cognition).  Work and work Statistician.  Reproductive health. Screening  You may have the following tests or measurements:  Height, weight, and BMI.  Blood pressure.  Lipid and cholesterol levels. These may be checked every 5 years, or more frequently if you are over 73 years old.  Skin check.  Lung cancer screening. You may have this screening every year starting at age 36 if you have a 30-pack-year history of smoking and currently smoke or have quit within the past 15 years.  Fecal occult blood test (FOBT) of the stool. You may have this test every year starting at age 19.  Flexible sigmoidoscopy or colonoscopy. You may have a sigmoidoscopy every 5 years or a colonoscopy every 10 years starting at age 53.  Hepatitis C blood test.  Hepatitis B blood test.  Sexually transmitted disease (STD) testing.  Diabetes screening. This is done by checking your blood sugar (glucose) after you have not eaten for a while (fasting). You may have this done every 1-3 years.  Bone density scan. This is done to screen for osteoporosis. You may have this done starting at age 51.  Mammogram. This may be done every 1-2 years. Talk to your health care provider about how often you should have regular mammograms. Talk with your  health care provider about your test results, treatment options, and if necessary, the need for more tests. Vaccines  Your health care provider may recommend certain vaccines, such as:  Influenza vaccine. This is recommended every year.  Tetanus, diphtheria, and acellular pertussis (Tdap, Td) vaccine. You may need a  Td booster every 10 years.  Zoster vaccine. You may need this after age 18.  Pneumococcal 13-valent conjugate (PCV13) vaccine. One dose is recommended after age 6.  Pneumococcal polysaccharide (PPSV23) vaccine. One dose is recommended after age 21. Talk to your health care provider about which screenings and vaccines you need and how often you need them. This information is not intended to replace advice given to you by your health care provider. Make sure you discuss any questions you have with your health care provider. Document Released: 01/11/2016 Document Revised: 09/03/2016 Document Reviewed: 10/16/2015 Elsevier Interactive Patient Education  2017 [place] Prevention in the Home Falls can cause injuries. They can happen to people of all ages. There are many things you can do to make your home safe and to help prevent falls. What can I do on the outside of my home?  Regularly fix the edges of walkways and driveways and fix any cracks.  Remove anything that might make you trip as you walk through a door, such as a raised step or threshold.  Trim any bushes or trees on the path to your home.  Use bright outdoor lighting.  Clear any walking paths of anything that might make someone trip, such as rocks or tools.  Regularly check to see if handrails are loose or broken. Make sure that both sides of any steps have handrails.  Any raised decks and porches should have guardrails on the edges.  Have any leaves, snow, or ice cleared regularly.  Use sand or salt on walking paths during winter.  Clean up any spills in your garage right away. This includes oil or grease spills. What can I do in the bathroom?  Use night lights.  Install grab bars by the toilet and in the tub and shower. Do not use towel bars as grab bars.  Use non-skid mats or decals in the tub or shower.  If you need to sit down in the shower, use a plastic, non-slip stool.  Keep the floor dry. Clean  up any water that spills on the floor as soon as it happens.  Remove soap buildup in the tub or shower regularly.  Attach bath mats securely with double-sided non-slip rug tape.  Do not have throw rugs and other things on the floor that can make you trip. What can I do in the bedroom?  Use night lights.  Make sure that you have a light by your bed that is easy to reach.  Do not use any sheets or blankets that are too big for your bed. They should not hang down onto the floor.  Have a firm chair that has side arms. You can use this for support while you get dressed.  Do not have throw rugs and other things on the floor that can make you trip. What can I do in the kitchen?  Clean up any spills right away.  Avoid walking on wet floors.  Keep items that you use a lot in easy-to-reach places.  If you need to reach something above you, use a strong step stool that has a grab bar.  Keep electrical cords out of the way.  Do not use  floor polish or wax that makes floors slippery. If you must use wax, use non-skid floor wax.  Do not have throw rugs and other things on the floor that can make you trip. What can I do with my stairs?  Do not leave any items on the stairs.  Make sure that there are handrails on both sides of the stairs and use them. Fix handrails that are broken or loose. Make sure that handrails are as long as the stairways.  Check any carpeting to make sure that it is firmly attached to the stairs. Fix any carpet that is loose or worn.  Avoid having throw rugs at the top or bottom of the stairs. If you do have throw rugs, attach them to the floor with carpet tape.  Make sure that you have a light switch at the top of the stairs and the bottom of the stairs. If you do not have them, ask someone to add them for you. What else can I do to help prevent falls?  Wear shoes that:  Do not have high heels.  Have rubber bottoms.  Are comfortable and fit you well.  Are  closed at the toe. Do not wear sandals.  If you use a stepladder:  Make sure that it is fully opened. Do not climb a closed stepladder.  Make sure that both sides of the stepladder are locked into place.  Ask someone to hold it for you, if possible.  Clearly mark and make sure that you can see:  Any grab bars or handrails.  First and last steps.  Where the edge of each step is.  Use tools that help you move around (mobility aids) if they are needed. These include:  Canes.  Walkers.  Scooters.  Crutches.  Turn on the lights when you go into a dark area. Replace any light bulbs as soon as they burn out.  Set up your furniture so you have a clear path. Avoid moving your furniture around.  If any of your floors are uneven, fix them.  If there are any pets around you, be aware of where they are.  Review your medicines with your doctor. Some medicines can make you feel dizzy. This can increase your chance of falling. Ask your doctor what other things that you can do to help prevent falls. This information is not intended to replace advice given to you by your health care provider. Make sure you discuss any questions you have with your health care provider. Document Released: 10/11/2009 Document Revised: 05/22/2016 Document Reviewed: 01/19/2015 Elsevier Interactive Patient Education  2017 [employer].

## 2020-09-06 NOTE — Telephone Encounter (Signed)
Left detailed message on machine for patient.  Okay to schedule her Prolia injection.

## 2020-09-18 ENCOUNTER — Other Ambulatory Visit: Payer: Self-pay | Admitting: Internal Medicine

## 2020-09-25 NOTE — Telephone Encounter (Signed)
;  pt call to make a appt for her prolia injection.

## 2020-09-26 NOTE — Telephone Encounter (Signed)
Okay to schedule

## 2020-10-03 ENCOUNTER — Ambulatory Visit (INDEPENDENT_AMBULATORY_CARE_PROVIDER_SITE_OTHER): Payer: Medicare Other | Admitting: *Deleted

## 2020-10-03 ENCOUNTER — Other Ambulatory Visit: Payer: Self-pay

## 2020-10-03 DIAGNOSIS — M810 Age-related osteoporosis without current pathological fracture: Secondary | ICD-10-CM | POA: Diagnosis not present

## 2020-10-03 MED ORDER — DENOSUMAB 60 MG/ML ~~LOC~~ SOSY
60.0000 mg | PREFILLED_SYRINGE | Freq: Once | SUBCUTANEOUS | Status: AC
  Administered 2020-10-03: 60 mg via SUBCUTANEOUS

## 2020-10-03 NOTE — Progress Notes (Signed)
Per orders of Dr. Panosh, injection of Prolia given by Kasson Lamere. Patient tolerated injection well.  

## 2020-10-16 NOTE — Progress Notes (Signed)
HPI: Follow-up mitral valve repair. Patient hospitalized October 2016 with congestive heart failure. Transesophageal echocardiogram showed normal LV function. There was a flail posterior leaflet with severe mitral regurgitation. There was mild to moderate tricuspid regurgitation. Cardiac catheterization October 2016 showed a 30% LAD, 60% first diagonal and 30% right coronary artery. Ejection fraction 60%. Patient subsequent had mitral valve repair. Note preoperative carotid Dopplers showed 1-39% stenosis. Note the patient had atrial fibrillation after surgery.Follow-up monitor March 2019 showed sinus rhythm with PVCs and paroxysmal atrial flutter/fibrillation. Last echocardiogram April 2019 showed normal LV function, moderate left ventricular hypertrophy, prior mitral valve repair and mild tricuspid regurgitation. Patient placed on amiodarone andapixaban. Follow-up monitor February 2021 due to presyncope showed sinus with occasional PAC, PVC and brief PAT.  Since last seen,  she has dyspnea with more vigorous activities.  No orthopnea, PND, pedal edema, chest pain, syncope or bleeding.  Current Outpatient Medications  Medication Sig Dispense Refill  . albuterol (VENTOLIN HFA) 108 (90 Base) MCG/ACT inhaler Inhale 2 puffs into the lungs every 6 (six) hours as needed for wheezing or shortness of breath. 18 g 2  . amiodarone (PACERONE) 200 MG tablet TAKE 1 TABLET DAILY 90 tablet 3  . apixaban (ELIQUIS) 2.5 MG TABS tablet Take 1 tablet (2.5 mg total) by mouth 2 (two) times daily. 180 tablet 1  . cholecalciferol (VITAMIN D) 1000 units tablet Take 1,000 Units by mouth daily.    . DiphenhydrAMINE HCl (BENADRYL PO) Take by mouth.    . ezetimibe (ZETIA) 10 MG tablet TAKE 1 TABLET DAILY 90 tablet 3  . fish oil-omega-3 fatty acids 1000 MG capsule Take 1 g by mouth daily.     . folic acid (FOLVITE) 1 MG tablet TAKE 1 TABLET DAILY 90 tablet 1  . glucosamine-chondroitin 500-400 MG tablet Take 1 tablet  by mouth daily.     [name] ipratropium (ATROVENT) 0.06 % nasal spray Place 1-2 sprays into both nostrils 2 (two) times daily as needed for rhinitis. 45 mL 5  . levothyroxine (SYNTHROID) 100 MCG tablet TAKE 1 TABLET EVERY MORNING ON AN EMPTY STOMACH 7 tablet 1  . Melatonin 3 MG TABS Take 3 mg by mouth at bedtime.     . MULTIPLE VITAMIN PO Take 1 tablet by mouth daily. 50+ Senior Vitamin Daily    . omeprazole (PRILOSEC) 40 MG capsule TAKE 1 CAPSULE DAILY 90 capsule 1  . Polyethyl Glycol-Propyl Glycol (SYSTANE OP) Place 1 drop into both eyes 2 (two) times daily. Use 1-2 Drops in both eyes    . rosuvastatin (CRESTOR) 5 MG tablet TAKE ONE-HALF (1/2) TABLET DAILY 90 tablet 3  . SALINE NASAL SPRAY NA Place 1 spray into both nostrils 2 (two) times daily as needed (congestion).     . triamcinolone (NASACORT ALLERGY 24HR) 55 MCG/ACT AERO nasal inhaler Place 1 spray into the nose daily.    . vitamin B-12 (CYANOCOBALAMIN) 1000 MCG tablet Take 1,000 mcg by mouth daily.     No current facility-administered medications for this visit.     Past Medical History:  Diagnosis Date  . Allergy   . Anemia   . Asymptomatic carotid artery stenosis    R ICA 40% stenosis on CTA 12/2011.  . Bladder polyps   . Cataract    BILATERAL-REMOVED  . CHF (congestive heart failure) ([place])   . Diverticulosis   . Elevated blood pressure 03/25/2011   Bp readings borderline today has hx of elvation  In office and ok  at home   Not checke recently   She gfeels was elevated from anxiety    . Episodic recurrent vertigo    MRI Head 2003  . Esophageal spasm   . GERD (gastroesophageal reflux disease)   . Hepatic hemangioma   . History of hepatitis    unknown type  . Hyperlipidemia   . Hyperplastic colon polyp   . Hypothyroidism   . Intestinal metaplasia of gastric mucosa   . Osteoarthritis   . Osteoporosis   . Patellar fracture 07/13/2012  . Scapular fracture 03/31/2012   small from direct blow with trip    . Subclavian steal  syndrome    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; CTA 2013 revealed open graft    Past Surgical History:  Procedure Laterality Date  . APPENDECTOMY    . CARDIAC CATHETERIZATION N/A 10/04/2015   Procedure: Right/Left Heart Cath and Coronary Angiography;  Surgeon: Belva Crome, MD;  Location: [facility];  Service: Cardiovascular;  Laterality: N/A;  . CAROTID-SUBCLAVIAN BYPASS GRAFT Left 1999   for [place] steal syndrome  . CHOLECYSTECTOMY    . COLONOSCOPY    . CYSTECTOMY Left    hand  . ELBOW SURGERY Left   . KNEE SURGERY Left 2014  . MITRAL VALVE REPAIR N/A 10/10/2015   Procedure: MITRAL VALVE REPAIR (MVR) WITH SIZE 28 CARPENTIER-EDWARDS PHYSIO II ANNULOPLASTY RING;  Surgeon: [name], MD;  Location: [facility];  Service: Open Heart Surgery;  Laterality: N/A;  . ROTATOR CUFF REPAIR Left   . TEAR DUCT PROBING  07/2014  . TEE WITHOUT CARDIOVERSION N/A 10/03/2015   Procedure: TRANSESOPHAGEAL ECHOCARDIOGRAM (TEE);  Surgeon: Larey Dresser, MD;  Location: [facility];  Service: Cardiovascular;  Laterality: N/A;  . TEE WITHOUT CARDIOVERSION N/A 10/10/2015   Procedure: TRANSESOPHAGEAL ECHOCARDIOGRAM (TEE);  Surgeon: [name], MD;  Location: [facility];  Service: Open Heart Surgery;  Laterality: N/A;  . TONSILLECTOMY    . TUBAL LIGATION      Social History   Socioeconomic History  . Marital status: Widowed    Spouse name: Not on file  . Number of children: 4  . Years of education: Not on file  . Highest education level: Bachelor's degree (e.g., BA, AB, BS)  Occupational History  . Occupation: retired Pharmacist, hospital  Tobacco Use  . Smoking status: Former Smoker    Packs/day: 0.75    Years: 20.00    Pack years: 15.00    Quit date: 01/05/1991    Years since quitting: 29.8  . Smokeless tobacco: Never Used  Vaping Use  . Vaping Use: Never used  Substance and Sexual Activity  . Alcohol use: Yes    Alcohol/week: 4.0 standard drinks    Types: 4 Glasses of wine per  week    Comment: socially  . Drug use: No  . Sexual activity: Not on file  Other Topics Concern  . Not on file  Social History Narrative   Widowed.  Lives alone in a one story home.  Has 4 children (3 living).  Retired first [employer].    HH of 1    No pets   Former smoker   Exercises regularly   Right handed   Social Determinants of Health   Financial Resource Strain: Low Risk   . Difficulty of Paying Living Expenses: Not hard at all  Food Insecurity: No Food Insecurity  . Worried About Charity fundraiser in the Last Year: Never true  .  Ran Out of Food in the Last Year: Never true  Transportation Needs: No Transportation Needs  . Lack of Transportation (Medical): No  . Lack of Transportation (Non-Medical): No  Physical Activity: Inactive  . Days of Exercise per Week: 0 days  . Minutes of Exercise per Session: 0 min  Stress: No Stress Concern Present  . Feeling of Stress : Only a little  Social Connections: Moderately Integrated  . Frequency of Communication with Friends and Family: More than three times a week  . Frequency of Social Gatherings with Friends and Family: Three times a week  . Attends Religious Services: More than 4 times per year  . Active Member of Clubs or Organizations: Yes  . Attends Archivist Meetings: 1 to 4 times per year  . Marital Status: Widowed  Intimate Partner Violence: Not At Risk  . Fear of Current or Ex-Partner: No  . Emotionally Abused: No  . Physically Abused: No  . Sexually Abused: No    Family History  Problem Relation Age of Onset  . Hypertension Mother   . Stroke Mother   . Heart disease Father   . Colon cancer Neg Hx   . Neurofibromatosis Neg Hx   . Allergic rhinitis Neg Hx   . Asthma Neg Hx   . Angioedema Neg Hx   . Atopy Neg Hx   . Eczema Neg Hx   . Immunodeficiency Neg Hx   . Urticaria Neg Hx     ROS: no fevers or chills, productive cough, hemoptysis, dysphasia, odynophagia, melena, hematochezia,  dysuria, hematuria, rash, seizure activity, orthopnea, PND, pedal edema, claudication. Remaining systems are negative.  Physical Exam: Well-developed well-nourished in no acute distress.  Skin is warm and dry.  HEENT is normal.  Neck is supple.  Chest is clear to auscultation with normal expansion.  Cardiovascular exam is regular rate and rhythm.  Abdominal exam nontender or distended. No masses palpated. Extremities show no edema. neuro grossly intact  A/P  1 paroxysmal atrial fibrillation-patient remains in sinus rhythm.  Continue amiodarone and apixaban.  Check hemoglobin, renal function, TSH, liver functions and chest x-ray.  2 hypertension-blood pressure controlled.  Continue present medical regimen.  3 prior mitral valve repair-continue SBE prophylaxis.  4 coronary artery disease-continue medical therapy.  Patient is not on aspirin given need for apixaban.  Continue statin.  5 hyperlipidemia-continue zetia and statin.  Check lipids and liver.  Kirk Ruths, MD

## 2020-10-19 DIAGNOSIS — Z23 Encounter for immunization: Secondary | ICD-10-CM | POA: Diagnosis not present

## 2020-10-22 ENCOUNTER — Encounter: Payer: Self-pay | Admitting: *Deleted

## 2020-10-22 ENCOUNTER — Other Ambulatory Visit: Payer: Self-pay

## 2020-10-22 ENCOUNTER — Encounter: Payer: Self-pay | Admitting: Cardiology

## 2020-10-22 ENCOUNTER — Ambulatory Visit (INDEPENDENT_AMBULATORY_CARE_PROVIDER_SITE_OTHER): Payer: Medicare Other | Admitting: Cardiology

## 2020-10-22 ENCOUNTER — Ambulatory Visit
Admission: RE | Admit: 2020-10-22 | Discharge: 2020-10-22 | Disposition: A | Discharge status: Home / Self Care | Payer: Medicare Other | Source: Ambulatory Visit | Attending: Cardiology | Admitting: Cardiology

## 2020-10-22 VITALS — BP 130/72 | HR 83 | Ht 62.0 in | Wt 119.8 lb

## 2020-10-22 DIAGNOSIS — Z9889 Other specified postprocedural states: Secondary | ICD-10-CM | POA: Diagnosis not present

## 2020-10-22 DIAGNOSIS — E7800 Pure hypercholesterolemia, unspecified: Secondary | ICD-10-CM | POA: Diagnosis not present

## 2020-10-22 DIAGNOSIS — Z951 Presence of aortocoronary bypass graft: Secondary | ICD-10-CM | POA: Diagnosis not present

## 2020-10-22 DIAGNOSIS — I2510 Atherosclerotic heart disease of native coronary artery without angina pectoris: Secondary | ICD-10-CM | POA: Diagnosis not present

## 2020-10-22 DIAGNOSIS — R0600 Dyspnea, unspecified: Secondary | ICD-10-CM | POA: Diagnosis not present

## 2020-10-22 DIAGNOSIS — I480 Paroxysmal atrial fibrillation: Secondary | ICD-10-CM | POA: Diagnosis not present

## 2020-10-22 DIAGNOSIS — I700 Atherosclerosis of aorta: Secondary | ICD-10-CM | POA: Diagnosis not present

## 2020-10-22 DIAGNOSIS — M4186 Other forms of scoliosis, lumbar region: Secondary | ICD-10-CM | POA: Diagnosis not present

## 2020-10-22 LAB — HEPATIC FUNCTION PANEL
ALT: 15 IU/L (ref 0–32)
AST: 17 IU/L (ref 0–40)
Albumin: 4 g/dL (ref 3.6–4.6)
Alkaline Phosphatase: 94 IU/L (ref 44–121)
Bilirubin Total: 0.4 mg/dL (ref 0.0–1.2)
Bilirubin, Direct: 0.16 mg/dL (ref 0.00–0.40)
Total Protein: 6.5 g/dL (ref 6.0–8.5)

## 2020-10-22 LAB — BASIC METABOLIC PANEL
BUN/Creatinine Ratio: 15 (ref 12–28)
BUN: 12 mg/dL (ref 8–27)
CO2: 25 mmol/L (ref 20–29)
Calcium: 9.3 mg/dL (ref 8.7–10.3)
Chloride: 106 mmol/L (ref 96–106)
Creatinine, Ser: 0.78 mg/dL (ref 0.57–1.00)
GFR calc Af Amer: 78 mL/min/{1.73_m2} (ref >59)
GFR calc non Af Amer: 68 mL/min/{1.73_m2} (ref >59)
Glucose: 88 mg/dL (ref 65–99)
Potassium: 4.7 mmol/L (ref 3.5–5.2)
Sodium: 142 mmol/L (ref 134–144)

## 2020-10-22 LAB — CBC
Hematocrit: 44.4 % (ref 34.0–46.6)
Hemoglobin: 14.8 g/dL (ref 11.1–15.9)
MCH: 32.4 pg (ref 26.6–33.0)
MCHC: 33.3 g/dL (ref 31.5–35.7)
MCV: 97 fL (ref 79–97)
Platelets: 197 10*3/uL (ref 150–450)
RBC: 4.57 x10E6/uL (ref 3.77–5.28)
RDW: 12.7 % (ref 11.7–15.4)
WBC: 4.5 10*3/uL (ref 3.4–10.8)

## 2020-10-22 LAB — LIPID PANEL
Chol/HDL Ratio: 2.3 ratio (ref 0.0–4.4)
Cholesterol, Total: 155 mg/dL (ref 100–199)
HDL: 66 mg/dL (ref >39)
LDL Chol Calc (NIH): 66 mg/dL (ref 0–99)
Triglycerides: 131 mg/dL (ref 0–149)
VLDL Cholesterol Cal: 23 mg/dL (ref 5–40)

## 2020-10-22 LAB — TSH: TSH: 3.02 u[IU]/mL (ref 0.450–4.500)

## 2020-10-22 NOTE — Patient Instructions (Signed)
  Lab Work:  Your physician recommends that you HAVE LAB WORK TODAY  If you have labs (blood work) drawn today and your tests are completely normal, you will receive your results only by: [name] MyChart Message (if you have MyChart) OR . A paper copy in the mail If you have any lab test that is abnormal or we need to change your treatment, we will call you to review the results.   Testing/Procedures:  A chest x-ray takes a picture of the organs and structures inside the chest, including the heart, lungs, and blood vessels. This test can show several things, including, whether the heart is enlarges; whether fluid is building up in the lungs; and whether pacemaker / defibrillator leads are still in place. [address]   Follow-Up: At [facility], you and your health needs are our priority.  As part of our continuing mission to provide you with exceptional heart care, we have created designated Provider Care Teams.  These Care Teams include your primary Cardiologist (physician) and Advanced Practice Providers (APPs -  Physician Assistants and Nurse Practitioners) who all work together to provide you with the care you need, when you need it.  We recommend signing up for the patient portal called "MyChart".  Sign up information is provided on this After Visit Summary.  MyChart is used to connect with patients for Virtual Visits (Telemedicine).  Patients are able to view lab/test results, encounter notes, upcoming appointments, etc.  Non-urgent messages can be sent to your provider as well.   To learn more about what you can do with MyChart, go to [email or web address].    Your next appointment:   12 month(s)  The format for your next appointment:   In Person  Provider:   Kirk Ruths, MD

## 2020-11-15 ENCOUNTER — Other Ambulatory Visit: Payer: Self-pay | Admitting: Cardiology

## 2020-11-15 NOTE — Telephone Encounter (Signed)
Dx A.FIB Last OV with Dr Stanford Breed - 10/22/2020 86yo Female 54.3kg Scr = 0.78 on 10/22/2020

## 2020-11-24 ENCOUNTER — Other Ambulatory Visit: Payer: Self-pay

## 2020-11-24 ENCOUNTER — Emergency Department (HOSPITAL_BASED_OUTPATIENT_CLINIC_OR_DEPARTMENT_OTHER)
Admission: EM | Admit: 2020-11-24 | Discharge: 2020-11-24 | Disposition: A | Discharge status: Home / Self Care | Payer: Medicare Other | Attending: Emergency Medicine | Admitting: Emergency Medicine

## 2020-11-24 ENCOUNTER — Encounter (HOSPITAL_BASED_OUTPATIENT_CLINIC_OR_DEPARTMENT_OTHER): Payer: Self-pay

## 2020-11-24 ENCOUNTER — Emergency Department (HOSPITAL_BASED_OUTPATIENT_CLINIC_OR_DEPARTMENT_OTHER): Payer: Medicare Other

## 2020-11-24 DIAGNOSIS — R5383 Other fatigue: Secondary | ICD-10-CM | POA: Insufficient documentation

## 2020-11-24 DIAGNOSIS — E039 Hypothyroidism, unspecified: Secondary | ICD-10-CM | POA: Insufficient documentation

## 2020-11-24 DIAGNOSIS — R059 Cough, unspecified: Secondary | ICD-10-CM | POA: Diagnosis not present

## 2020-11-24 DIAGNOSIS — I2510 Atherosclerotic heart disease of native coronary artery without angina pectoris: Secondary | ICD-10-CM | POA: Diagnosis not present

## 2020-11-24 DIAGNOSIS — I5031 Acute diastolic (congestive) heart failure: Secondary | ICD-10-CM | POA: Insufficient documentation

## 2020-11-24 DIAGNOSIS — U071 COVID-19: Secondary | ICD-10-CM | POA: Diagnosis not present

## 2020-11-24 DIAGNOSIS — Z7901 Long term (current) use of anticoagulants: Secondary | ICD-10-CM | POA: Diagnosis not present

## 2020-11-24 DIAGNOSIS — Z79899 Other long term (current) drug therapy: Secondary | ICD-10-CM | POA: Insufficient documentation

## 2020-11-24 DIAGNOSIS — I4891 Unspecified atrial fibrillation: Secondary | ICD-10-CM | POA: Diagnosis not present

## 2020-11-24 DIAGNOSIS — Z87891 Personal history of nicotine dependence: Secondary | ICD-10-CM | POA: Insufficient documentation

## 2020-11-24 DIAGNOSIS — R0602 Shortness of breath: Secondary | ICD-10-CM | POA: Diagnosis not present

## 2020-11-24 DIAGNOSIS — Z951 Presence of aortocoronary bypass graft: Secondary | ICD-10-CM | POA: Diagnosis not present

## 2020-11-24 LAB — RESP PANEL BY RT-PCR (FLU A&B, COVID) ARPGX2
Influenza A by PCR: NEGATIVE
Influenza B by PCR: NEGATIVE
SARS Coronavirus 2 by RT PCR: POSITIVE — AB

## 2020-11-24 MED ORDER — ONDANSETRON HCL 4 MG PO TABS: 4.0000 mg | ORAL_TABLET | Freq: Four times a day (QID) | ORAL | 0 refills | Status: DC

## 2020-11-24 NOTE — ED Provider Notes (Addendum)
[facility]ERGENC[facility] DEPARTMENT Provider Note   CSN: [id number] Arrival date & time: 11/24/20  1359     History Chief Complaint  Patient presents with  . Cough  . Shortness of Breath    Amy Fischer is a 86 y.o. female.  HPI Patient is an 86 year old female with past medical history of paroxysmal A. fib, CHF, reflux, HLD, episodic vertigo, hypothyroidism  Patient presented today with cough, congestion and fatigue for the past 4 days.  She states that her son is in town and was coughing in her vicinity this past Sunday she states that she also was around him while he was coughing on Monday and Wednesday.  She states that he tested positive for Covid a few days ago and informed her of his status.  She denies any chest pain apart from when she coughs which she stated is a nonproductive cough which is dry and hacking.  She states she feels fatigued and short of breath however she states she does not feel like she is gasping for air wheezing or overly hampered by her breathing.  She states she just feels less like getting around and doing her daily activities.  She denies any fevers or chills.  No lightheadedness or dizziness, no episodes of syncope or near syncope.  She has been taking her medications as prescribed which include Eliquis for PAF.  No other associated symptoms.  No aggravating mitigating factors.  She denies medications prior to arrival.    Past Medical History:  Diagnosis Date  . Allergy   . Anemia   . Asymptomatic carotid artery stenosis    R ICA 40% stenosis on CTA 12/2011.  . Bladder polyps   . Cataract    BILATERAL-REMOVED  . CHF (congestive heart failure) ([place])   . Diverticulosis   . Elevated blood pressure 03/25/2011   Bp readings borderline today has hx of elvation  In office and ok at home   Not checke recently   She gfeels was elevated from anxiety    . Episodic recurrent vertigo    MRI Head 2003  . Esophageal spasm   . GERD  (gastroesophageal reflux disease)   . Hepatic hemangioma   . History of hepatitis    unknown type  . Hyperlipidemia   . Hyperplastic colon polyp   . Hypothyroidism   . Intestinal metaplasia of gastric mucosa   . Osteoarthritis   . Osteoporosis   . Patellar fracture 07/13/2012  . Scapular fracture 03/31/2012   small from direct blow with trip    . Subclavian steal syndrome    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; CTA 2013 revealed open graft    Patient Active Problem List   Diagnosis Date Noted  . CAD (coronary artery disease) 02/22/2016  . Unsteadiness 11/20/2015  . PAF (paroxysmal atrial fibrillation) ([place]) 11/06/2015  . Chronic diastolic CHF (congestive heart failure), NYHA class 2 ([place]) 11/06/2015  . S/P MVR (mitral valve repair) 10/10/2015  . Severe mitral regurgitation   . Shortness of breath   . Acute congestive heart failure ([place])   . Left hip pain   . Mitral regurgitation 10/02/2015  . Paroxysmal atrial fibrillation ([place]) 10/01/2015  . Acute respiratory failure with hypoxia and hypercapnia ([facility])   . Respiratory distress 09/30/2015  . Acute on chronic diastolic congestive heart failure, NYHA class 3 ([place]) 09/30/2015  . Hx of fracture 04/26/2015  . Diplopia 04/26/2015  . Medicare annual wellness visit, subsequent 07/26/2014  . Diarrhea  03/30/2014  . Rhinitis 10/06/2013  . Hx of vomiting 05/26/2013  . Carotid artery disease without cerebral infarction ([place]) 03/22/2013  . Hypertension, isolated systolic 29/51/8841  . Bereavement 03/22/2013  . Fracture, finger 07/13/2012  . Neuritis 04/10/2012  . Constipation, other cause 03/31/2012  . Hx of fall 03/31/2012  . Esophageal disorder 11/27/2011  . Atypical chest pain 11/27/2011  . Dizziness 11/27/2011  . Elevated blood pressure 03/25/2011  . Subclavian steal syndrome 09/24/2010  . GAIT IMBALANCE 03/19/2010  . SKIN LESION, UNCERTAIN SIGNIFICANCE 09/18/2009  . SLEEPLESSNESS 03/20/2009  . HYPERGLYCEMIA 03/20/2009   . VITAMIN D DEFICIENCY 11/09/2007  . ALLERGIC RHINITIS 11/09/2007  . LEG PAIN, BILATERAL 11/09/2007  . Hypothyroidism 08/19/2007  . HYPERLIPIDEMIA 08/19/2007  . GERD 08/19/2007  . OSTEOARTHRITIS 08/19/2007  . Osteoporosis 08/19/2007    Past Surgical History:  Procedure Laterality Date  . APPENDECTOMY    . CARDIAC CATHETERIZATION N/A 10/04/2015   Procedure: Right/Left Heart Cath and Coronary Angiography;  Surgeon: Belva Crome, MD;  Location: [facility];  Service: Cardiovascular;  Laterality: N/A;  . CAROTID-SUBCLAVIAN BYPASS GRAFT Left 1999   for [place] steal syndrome  . CHOLECYSTECTOMY    . COLONOSCOPY    . CYSTECTOMY Left    hand  . ELBOW SURGERY Left   . KNEE SURGERY Left 2014  . MITRAL VALVE REPAIR N/A 10/10/2015   Procedure: MITRAL VALVE REPAIR (MVR) WITH SIZE 28 CARPENTIER-EDWARDS PHYSIO II ANNULOPLASTY RING;  Surgeon: [name], MD;  Location: [facility];  Service: Open Heart Surgery;  Laterality: N/A;  . ROTATOR CUFF REPAIR Left   . TEAR DUCT PROBING  07/2014  . TEE WITHOUT CARDIOVERSION N/A 10/03/2015   Procedure: TRANSESOPHAGEAL ECHOCARDIOGRAM (TEE);  Surgeon: Larey Dresser, MD;  Location: [facility];  Service: Cardiovascular;  Laterality: N/A;  . TEE WITHOUT CARDIOVERSION N/A 10/10/2015   Procedure: TRANSESOPHAGEAL ECHOCARDIOGRAM (TEE);  Surgeon: [name], MD;  Location: [facility];  Service: Open Heart Surgery;  Laterality: N/A;  . TONSILLECTOMY    . TUBAL LIGATION       OB History   No obstetric history on file.     Family History  Problem Relation Age of Onset  . Hypertension Mother   . Stroke Mother   . Heart disease Father   . Colon cancer Neg Hx   . Neurofibromatosis Neg Hx   . Allergic rhinitis Neg Hx   . Asthma Neg Hx   . Angioedema Neg Hx   . Atopy Neg Hx   . Eczema Neg Hx   . Immunodeficiency Neg Hx   . Urticaria Neg Hx     Social History   Tobacco Use  . Smoking status: Former Smoker    Packs/day: 0.75    Years:  20.00    Pack years: 15.00    Quit date: 01/05/1991    Years since quitting: 29.9  . Smokeless tobacco: Never Used  Vaping Use  . Vaping Use: Never used  Substance Use Topics  . Alcohol use: Yes    Alcohol/week: 4.0 standard drinks    Types: 4 Glasses of wine per week    Comment: socially  . Drug use: No    Home Medications Prior to Admission medications   Medication Sig Start Date End Date Taking? Authorizing Provider  albuterol (VENTOLIN HFA) 108 (90 Base) MCG/ACT inhaler Inhale 2 puffs into the lungs every 6 (six) hours as needed for wheezing or shortness of breath. 11/29/19   Kozlow, Donnamarie Poag, MD  amiodarone (PACERONE) 200 MG tablet TAKE 1 TABLET DAILY 09/30/19   Lelon Perla, MD  cholecalciferol (VITAMIN D) 1000 units tablet Take 1,000 Units by mouth daily.    [provider]  DiphenhydrAMINE HCl (BENADRYL PO) Take by mouth.    [provider]  ELIQUIS 2.5 MG TABS tablet TAKE 1 TABLET TWICE A DAY 11/15/20   Lelon Perla, MD  ezetimibe (ZETIA) 10 MG tablet TAKE 1 TABLET DAILY 03/15/20   Panosh, [name], MD  fish oil-omega-3 fatty acids 1000 MG capsule Take 1 g by mouth daily.     [provider]  folic acid (FOLVITE) 1 MG tablet TAKE 1 TABLET DAILY 09/18/20   Panosh, [name], MD  glucosamine-chondroitin 500-400 MG tablet Take 1 tablet by mouth daily.     [provider]  ipratropium (ATROVENT) 0.06 % nasal spray Place 1-2 sprays into both nostrils 2 (two) times daily as needed for rhinitis. 11/29/19   Kozlow, Donnamarie Poag, MD  levothyroxine (SYNTHROID) 100 MCG tablet TAKE 1 TABLET EVERY MORNING ON AN EMPTY STOMACH 06/18/20   Panosh, [name], MD  Melatonin 3 MG TABS Take 3 mg by mouth at bedtime.     [provider]  MULTIPLE VITAMIN PO Take 1 tablet by mouth daily. 50+ Senior Vitamin Daily    [provider]  omeprazole (PRILOSEC) 40 MG capsule TAKE 1 CAPSULE DAILY 02/08/15   Panosh, [name], MD  ondansetron (ZOFRAN) 4 MG tablet Take  1 tablet (4 mg total) by mouth every 6 (six) hours. 11/24/20   Tedd Sias, PA  Polyethyl Glycol-Propyl Glycol (SYSTANE OP) Place 1 drop into both eyes 2 (two) times daily. Use 1-2 Drops in both eyes    [provider]  rosuvastatin (CRESTOR) 5 MG tablet TAKE ONE-HALF (1/2) TABLET DAILY 04/20/20   Panosh, [name], MD  SALINE NASAL SPRAY NA Place 1 spray into both nostrils 2 (two) times daily as needed (congestion).     [provider]  triamcinolone (NASACORT ALLERGY 24HR) 55 MCG/ACT AERO nasal inhaler Place 1 spray into the nose daily.    [provider]  vitamin B-12 (CYANOCOBALAMIN) 1000 MCG tablet Take 1,000 mcg by mouth daily.    [provider]    Allergies    Codeine, Risedronate sodium, Statins, Tape, and Amoxicillin-pot clavulanate  Review of Systems   Review of Systems  Constitutional: Positive for fatigue. Negative for chills and fever.  HENT: Positive for congestion.   Respiratory: Positive for cough and shortness of breath.   Cardiovascular: Negative for chest pain.  Gastrointestinal: Negative for abdominal distention, nausea and vomiting.  Musculoskeletal: Negative for myalgias.  Skin: Negative for rash.  Neurological: Negative for dizziness and headaches.    Physical Exam Updated Vital Signs BP (!) 166/80 (BP Location: Right Arm)   Pulse 92   Temp 98.2 F (36.8 C) (Oral)   Resp 16   Ht 5\' 2"  (1.575 m)   Wt 54.4 kg   SpO2 96%   BMI 21.95 kg/m   Physical Exam Vitals and nursing note reviewed.  Constitutional:      General: She is not in acute distress.    Comments: Pleasant well-appearing 86 year old.  In no acute distress.  Sitting comfortably in bed.  Able answer questions appropriately follow commands. No increased work of breathing. Speaking in full sentences.  HENT:     Head: Normocephalic and atraumatic.     Nose: Nose normal.     Mouth/Throat:  Mouth: Mucous membranes are moist.  Eyes:     General: No  scleral icterus. Cardiovascular:     Rate and Rhythm: Normal rate and regular rhythm.     Pulses: Normal pulses.     Heart sounds: Normal heart sounds.  Pulmonary:     Effort: Pulmonary effort is normal. No respiratory distress.     Breath sounds: Normal breath sounds. No wheezing.  Abdominal:     Palpations: Abdomen is soft.     Tenderness: There is no abdominal tenderness.  Musculoskeletal:     Cervical back: Normal range of motion.     Right lower leg: No edema.     Left lower leg: No edema.  Skin:    General: Skin is warm and dry.     Capillary Refill: Capillary refill takes less than 2 seconds.  Neurological:     Mental Status: She is alert. Mental status is at baseline.  Psychiatric:        Mood and Affect: Mood normal.        Behavior: Behavior normal.     ED Results / Procedures / Treatments   Labs (all labs ordered are listed, but only abnormal results are displayed) Labs Reviewed  RESP PANEL BY RT-PCR (FLU A&B, COVID) ARPGX2 - Abnormal; Notable for the following components:      Result Value   SARS Coronavirus 2 by RT PCR POSITIVE (*)    All other components within normal limits    EKG None  Radiology DG Chest Port 1 View  Result Date: 11/24/2020 CLINICAL DATA:  Shortness of breath and cough. EXAM: PORTABLE CHEST 1 VIEW COMPARISON:  October 22, 2020 FINDINGS: Stable postsurgical changes post mitral valve replacement. Calcific atherosclerotic disease and tortuosity of the aorta. Cardiomediastinal silhouette is normal. Mediastinal contours appear intact. There is no evidence of focal airspace consolidation, pleural effusion or pneumothorax. Osseous structures are without acute abnormality. Soft tissues are grossly normal. IMPRESSION: No active disease. Electronically Signed   By: Fidela Salisbury M.D.   On: 11/24/2020 15:31    Procedures Procedures (including critical care time)  Medications Ordered in ED Medications - No data to display  ED Course  I  have reviewed the triage vital signs and the nursing notes.  Pertinent labs & imaging results that were available during my care of the patient were reviewed by me and considered in my medical decision making (see chart for details).  Patient is 86 year old female w PMH detailed above.   Patient has reassuring physical exam.  Lungs are clear to auscultation all fields her vital signs are within normal apart from mild hypertension.  Clinical Course as of Nov 24 1553  Sat Nov 24, 2020  1548 X-ray reviewed by myself shows no acute abnormality no focal infiltrates  Respiratory panel positive for Covid negative for flu AMB   [WF]    Clinical Course User Index [WF] Tedd Sias, [place]   MDM Rules/Calculators/A&P                          Patient ambulated with SPO2 97% and did not become dyspneic  Patient's phone number updated in the system by RN.  It is [phone]  I reached out to [facility] infusion staff we will schedule her for infusion.  She is understanding of return precautions.  Patient assessed at bedside by my attending physician prior to discharge.  Trudee Chirino Rubert was evaluated in Emergency Department on  11/24/2020 for the symptoms described in the history of present illness. She was evaluated in the context of the global COVID-19 pandemic, which necessitated consideration that the patient might be at risk for infection with the SARS-CoV-2 virus that causes COVID-19. Institutional protocols and algorithms that pertain to the evaluation of patients at risk for COVID-19 are in a state of rapid change based on information released by regulatory bodies including the CDC and federal and state organizations. These policies and algorithms were followed during the patient's care in the ED.  Final Clinical Impression(s) / ED Diagnoses Final diagnoses:  COVID-19  Cough  Fatigue, unspecified type    Rx / DC Orders ED Discharge Orders         Ordered    ondansetron (ZOFRAN) 4 MG tablet   Every 6 hours        11/24/20 1551           Tedd Sias, [place] 11/24/20 1556    Tedd Sias, [place] 11/24/20 1603    Sharlett Iles, MD 11/26/20 [id number]

## 2020-11-24 NOTE — Discharge Instructions (Addendum)
Your test was positive for Covid.  Please expect a phone call from the monoclonal antibody infusion center.  Please make sure with our RN [place] that your phone number is up-to-date in our system.  It is very important to continue to eat even if you lose your sense of taste and smell and your appetite.  This will help keep your energy up to hopefully get better.  Please do plenty of water.  Please take Tylenol as needed for body aches and fever.  I have printed you a nausea medicine to use as needed if you experience nausea.   As we discussed, it is very good that you received your vaccine.

## 2020-11-24 NOTE — ED Notes (Signed)
No shortness of breath with ambulation trial. 02 sats 97%

## 2020-11-24 NOTE — ED Triage Notes (Signed)
Pt exposed to COVID, was with person earlier this week. Pt now complaining of cough/SOB. Has had covid vaccines and booster.

## 2020-11-25 ENCOUNTER — Other Ambulatory Visit: Payer: Self-pay | Admitting: Nurse Practitioner

## 2020-11-25 DIAGNOSIS — U071 COVID-19: Secondary | ICD-10-CM

## 2020-11-25 DIAGNOSIS — I10 Essential (primary) hypertension: Secondary | ICD-10-CM

## 2020-11-25 NOTE — Progress Notes (Signed)
I connected by phone with Amy Fischer on 11/25/2020 at 9:23 AM to discuss the potential use of a new treatment for mild to moderate COVID-19 viral infection in non-hospitalized patients.  This patient is a 86 y.o. female that meets the FDA criteria for Emergency Use Authorization of COVID monoclonal antibody Sotrovimab.  Has a (+) direct SARS-CoV-2 viral test result  Has mild or moderate COVID-19   Is NOT hospitalized due to COVID-19  Is within 10 days of symptom onset  Has at least one of the high risk factor(s) for progression to severe COVID-19 and/or hospitalization as defined in EUA.  Specific high risk criteria : Older age (>/= 84 yo); HTN/CHF   I have spoken and communicated the following to the patient or parent/caregiver regarding COVID monoclonal antibody treatment:  1. FDA has authorized the emergency use for the treatment of mild to moderate COVID-19 in adults and pediatric patients with positive results of direct SARS-CoV-2 viral testing who are 56 years of age and older weighing at least 40 kg, and who are at high risk for progressing to severe COVID-19 and/or hospitalization.  2. The significant known and potential risks and benefits of COVID monoclonal antibody, and the extent to which such potential risks and benefits are unknown.  3. Information on available alternative treatments and the risks and benefits of those alternatives, including clinical trials.  4. Patients treated with COVID monoclonal antibody should continue to self-isolate and use infection control measures (e.g., wear mask, isolate, social distance, avoid sharing personal items, clean and disinfect "high touch" surfaces, and frequent handwashing) according to CDC guidelines.   5. The patient or parent/caregiver has the option to accept or refuse COVID monoclonal antibody treatment.  After reviewing this information with the patient, the patient has agreed to receive one of the available covid 19  monoclonal antibodies and will be provided an appropriate fact sheet prior to infusion.  Murray Hodgkins, NP 11/25/2020 9:23 AM

## 2020-11-26 ENCOUNTER — Ambulatory Visit (HOSPITAL_COMMUNITY)
Admission: RE | Admit: 2020-11-26 | Discharge: 2020-11-26 | Disposition: A | Discharge status: Home / Self Care | Payer: Medicare Other | Source: Ambulatory Visit | Attending: Pulmonary Disease | Admitting: Pulmonary Disease

## 2020-11-26 DIAGNOSIS — U071 COVID-19: Secondary | ICD-10-CM | POA: Insufficient documentation

## 2020-11-26 DIAGNOSIS — I10 Essential (primary) hypertension: Secondary | ICD-10-CM | POA: Diagnosis not present

## 2020-11-26 DIAGNOSIS — Z23 Encounter for immunization: Secondary | ICD-10-CM | POA: Insufficient documentation

## 2020-11-26 MED ORDER — DIPHENHYDRAMINE HCL 50 MG/ML IJ SOLN: 50.0000 mg | Freq: Once | INTRAMUSCULAR | Status: DC | PRN

## 2020-11-26 MED ORDER — FAMOTIDINE IN NACL 20-0.9 MG/50ML-% IV SOLN: 20.0000 mg | Freq: Once | INTRAVENOUS | Status: DC | PRN

## 2020-11-26 MED ORDER — SOTROVIMAB 500 MG/8ML IV SOLN
500.0000 mg | Freq: Once | INTRAVENOUS | Status: AC
  Administered 2020-11-26: 500 mg via INTRAVENOUS

## 2020-11-26 MED ORDER — METHYLPREDNISOLONE SODIUM SUCC 125 MG IJ SOLR: 125.0000 mg | Freq: Once | INTRAMUSCULAR | Status: DC | PRN

## 2020-11-26 MED ORDER — EPINEPHRINE 0.3 MG/0.3ML IJ SOAJ: 0.3000 mg | Freq: Once | INTRAMUSCULAR | Status: DC | PRN

## 2020-11-26 MED ORDER — SODIUM CHLORIDE 0.9 % IV SOLN: INTRAVENOUS | Status: DC | PRN

## 2020-11-26 MED ORDER — ALBUTEROL SULFATE HFA 108 (90 BASE) MCG/ACT IN AERS: 2.0000 | INHALATION_SPRAY | Freq: Once | RESPIRATORY_TRACT | Status: DC | PRN

## 2020-11-26 NOTE — Discharge Instructions (Signed)
10 Things You Can Do to Manage Your COVID-19 Symptoms at Home If you have possible or confirmed COVID-19: 1. Stay home from work and school. And stay away from other public places. If you must go out, avoid using any kind of public transportation, ridesharing, or taxis. 2. Monitor your symptoms carefully. If your symptoms get worse, call your healthcare provider immediately. 3. Get rest and stay hydrated. 4. If you have a medical appointment, call the healthcare provider ahead of time and tell them that you have or may have COVID-19. 5. For medical emergencies, call 911 and notify the dispatch personnel that you have or may have COVID-19. 6. Cover your cough and sneezes with a tissue or use the inside of your elbow. 7. Wash your hands often with soap and water for at least 20 seconds or clean your hands with an alcohol-based hand sanitizer that contains at least 60% alcohol. 8. As much as possible, stay in a specific room and away from other people in your home. Also, you should use a separate bathroom, if available. If you need to be around other people in or outside of the home, wear a mask. 9. Avoid sharing personal items with other people in your household, like dishes, towels, and bedding. 10. Clean all surfaces that are touched often, like counters, tabletops, and doorknobs. Use household cleaning sprays or wipes according to the label instructions. cdc.gov/coronavirus 06/29/2019 This information is not intended to replace advice given to you by your health care provider. Make sure you discuss any questions you have with your health care provider. Document Revised: 12/01/2019 Document Reviewed: 12/01/2019 Elsevier Patient Education  2020 Elsevier Inc.  What types of side effects do monoclonal antibody drugs cause?  Common side effects  In general, the more common side effects caused by monoclonal antibody drugs include: . Allergic reactions, such as hives or itching . Flu-like signs and  symptoms, including chills, fatigue, fever, and muscle aches and pains . Nausea, vomiting . Diarrhea . Skin rashes . Low blood pressure   The CDC is recommending patients who receive monoclonal antibody treatments wait at least 90 days before being vaccinated.  Currently, there are no data on the safety and efficacy of mRNA COVID-19 vaccines in persons who received monoclonal antibodies or convalescent plasma as part of COVID-19 treatment. Based on the estimated half-life of such therapies as well as evidence suggesting that reinfection is uncommon in the 90 days after initial infection, vaccination should be deferred for at least 90 days, as a precautionary measure until additional information becomes available, to avoid interference of the antibody treatment with vaccine-induced immune responses.  If you have any questions or concerns after the infusion please call the Advanced Practice Provider on call at [phone]. This number is only intended for your use regarding questions or concerns about the infusion post-treatment side-effects.  Please do not provide this number to others for use.   If someone you know is interested in receiving treatment please have them call the COVID hotline at [phone].   

## 2020-11-26 NOTE — Progress Notes (Signed)
Patient reviewed Fact Sheet for Patients, Parents, and Caregivers for Emergency Use Authorization (EUA) of Sotrovimab for the Treatment of Coronavirus. Patient also reviewed and is agreeable to the estimated cost of treatment. Patient is agreeable to proceed.   

## 2020-11-27 ENCOUNTER — Telehealth: Payer: Self-pay | Admitting: Internal Medicine

## 2020-11-27 NOTE — Telephone Encounter (Signed)
Pt call and want a cough syrup with no codeine in it sent to  [facility] [address] [place] Phone:  [phone]  Fax:  [phone]

## 2020-11-27 NOTE — Telephone Encounter (Signed)
mucinex dm otc  And warm liquids such as hot tea and honey      Otherwise need visit to discuss  If other options would be helpfuls  I see that she has covid 19

## 2020-11-29 NOTE — Telephone Encounter (Signed)
Spoke with patient about below message. Understood, nothing further needed

## 2020-12-04 ENCOUNTER — Ambulatory Visit: Payer: Medicare Other | Admitting: Allergy and Immunology

## 2020-12-14 DIAGNOSIS — Z961 Presence of intraocular lens: Secondary | ICD-10-CM | POA: Diagnosis not present

## 2020-12-24 ENCOUNTER — Other Ambulatory Visit: Payer: Self-pay | Admitting: Allergy and Immunology

## 2020-12-24 ENCOUNTER — Telehealth: Payer: Self-pay | Admitting: Internal Medicine

## 2020-12-24 MED ORDER — IPRATROPIUM BROMIDE 0.06 % NA SOLN: 1.0000 | Freq: Two times a day (BID) | NASAL | 5 refills | Status: DC | PRN

## 2020-12-24 NOTE — Telephone Encounter (Signed)
Rx changed & re-sent.

## 2020-12-24 NOTE — Telephone Encounter (Signed)
Okay for Rx change?

## 2020-12-24 NOTE — Telephone Encounter (Signed)
Ipratropium (ATROVENT) 0.06 % nasal spray said she couldn't take two sprays. want to know if she has one spray in each nostril twice a day  sent to  [facility], [place]  Phone:  [phone] Fax:  [phone]

## 2020-12-24 NOTE — Telephone Encounter (Signed)
Ok to change to one spray  Each nostril  Bid

## 2020-12-25 ENCOUNTER — Other Ambulatory Visit: Payer: Self-pay

## 2020-12-25 ENCOUNTER — Encounter: Payer: Self-pay | Admitting: Allergy and Immunology

## 2020-12-25 ENCOUNTER — Ambulatory Visit (INDEPENDENT_AMBULATORY_CARE_PROVIDER_SITE_OTHER): Payer: Medicare Other | Admitting: Allergy and Immunology

## 2020-12-25 VITALS — BP 130/82 | HR 89 | Temp 98.1°F | Resp 16 | Ht 61.5 in | Wt 118.2 lb

## 2020-12-25 DIAGNOSIS — J3089 Other allergic rhinitis: Secondary | ICD-10-CM | POA: Diagnosis not present

## 2020-12-25 DIAGNOSIS — K219 Gastro-esophageal reflux disease without esophagitis: Secondary | ICD-10-CM | POA: Diagnosis not present

## 2020-12-25 DIAGNOSIS — I2510 Atherosclerotic heart disease of native coronary artery without angina pectoris: Secondary | ICD-10-CM | POA: Diagnosis not present

## 2020-12-25 DIAGNOSIS — J438 Other emphysema: Secondary | ICD-10-CM

## 2020-12-25 NOTE — Patient Instructions (Addendum)
   1.  If needed:    A. Albuterol HFA - 2 inhalations or Duoneb nebulization every 6 hours    B. Loratadine 10 mg one time per day  C. Nasal ipratropium 0.06 - 1-2 sprays each nostril every 6 hours   D. Omeprazole 40 mg tablet 1 time per day  2. Return to clinic in 1 year or earlier if problem

## 2020-12-25 NOTE — Progress Notes (Signed)
[place] - [place] - [place] - [place] - [place]   Follow-up Note  Referring Provider: Madelin Headings, MD Primary Provider: Madelin Headings, MD Date of Office Visit: 12/25/2020  Subjective:   Amy Fischer (DOB: 04-20-1929) is a 86 y.o. female who [facility] in re-evaluation of the following:  HPI: Reyna returns to this clinic in evaluation of rhinitis and LPR and a history of emphysema.  Her last visit to this clinic was 29 November 2019.  She is really doing very well and has no problems with her airway at this point other than the fact that she cannot smell or taste ever since she contracted Covid in November 2021.  She still uses nasal ipratropium to dry up her nose which is working great.  She rarely uses albuterol and does not really have significant shortness of breath unless she exerts herself to an extent.  Her reflux has improved significantly as she is not consuming any caffeine at this point.  She rarely and intermittently uses omeprazole.  She has had 3 Covid vaccines and in November 2021 contracted Covid infection manifested as respiratory tract infection and extreme fatigue and anosmia and decreased ability to taste for which she received a monoclonal antibody infusion.  All of her symptoms resolved other than the fact that she still cannot smell and taste.  She has received the flu vaccine.  Allergies as of 12/25/2020      Reactions   Codeine Nausea And Vomiting   Risedronate Sodium    Upset stomach. Could take Fosamax.   Statins    Muscles hurt, can take low dose   Tape Other (See Comments)   Blisters, Please use "paper" tape   Amoxicillin-pot Clavulanate Diarrhea   Not allergic  2014, Pt can take z pack only Not allergic  2014, Pt can take z pack only      Medication List      albuterol 108 (90 Base) MCG/ACT inhaler Commonly known as: VENTOLIN HFA Inhale 2 puffs into the lungs every 6 (six) hours as  needed for wheezing or shortness of breath.   amiodarone 200 MG tablet Commonly known as: PACERONE TAKE 1 TABLET DAILY   BENADRYL PO Take by mouth.   cholecalciferol 1000 units tablet Commonly known as: VITAMIN D Take 1,000 Units by mouth daily.   Eliquis 2.5 MG Tabs tablet Generic drug: apixaban TAKE 1 TABLET TWICE A DAY   ezetimibe 10 MG tablet Commonly known as: ZETIA TAKE 1 TABLET DAILY   fish oil-omega-3 fatty acids 1000 MG capsule Take 1 g by mouth daily.   folic acid 1 MG tablet Commonly known as: FOLVITE TAKE 1 TABLET DAILY   glucosamine-chondroitin 500-400 MG tablet Take 1 tablet by mouth daily.   ipratropium 0.06 % nasal spray Commonly known as: ATROVENT Place 1 spray into both nostrils 2 (two) times daily as needed for rhinitis.   levothyroxine 100 MCG tablet Commonly known as: Synthroid TAKE 1 TABLET EVERY MORNING ON AN EMPTY STOMACH   melatonin 3 MG Tabs tablet Take 3 mg by mouth at bedtime.   MULTIPLE VITAMIN PO Take 1 tablet by mouth daily. 50+ Senior Vitamin Daily   omeprazole 40 MG capsule Commonly known as: PRILOSEC TAKE 1 CAPSULE DAILY   ondansetron 4 MG tablet Commonly known as: ZOFRAN Take 1 tablet (4 mg total) by mouth every 6 (six) hours.   rosuvastatin 5 MG tablet Commonly known as: CRESTOR TAKE ONE-HALF (  1/2) TABLET DAILY   SALINE NASAL SPRAY NA Place 1 spray into both nostrils 2 (two) times daily as needed (congestion).   SYSTANE OP Place 1 drop into both eyes 2 (two) times daily. Use 1-2 Drops in both eyes   triamcinolone 55 MCG/ACT Aero nasal inhaler Commonly known as: NASACORT Place 1 spray into the nose daily.   vitamin B-12 1000 MCG tablet Commonly known as: CYANOCOBALAMIN Take 1,000 mcg by mouth daily.       Past Medical History:  Diagnosis Date  . Allergy   . Anemia   . Asymptomatic carotid artery stenosis    R ICA 40% stenosis on CTA 12/2011.  . Bladder polyps   . Cataract    BILATERAL-REMOVED  . CHF  (congestive heart failure) ([place])   . Diverticulosis   . Elevated blood pressure 03/25/2011   Bp readings borderline today has hx of elvation  In office and ok at home   Not checke recently   She gfeels was elevated from anxiety    . Episodic recurrent vertigo    MRI Head 2003  . Esophageal spasm   . GERD (gastroesophageal reflux disease)   . Hepatic hemangioma   . History of hepatitis    unknown type  . Hyperlipidemia   . Hyperplastic colon polyp   . Hypothyroidism   . Intestinal metaplasia of gastric mucosa   . Osteoarthritis   . Osteoporosis   . Patellar fracture 07/13/2012  . Scapular fracture 03/31/2012   small from direct blow with trip    . Subclavian steal syndrome    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; CTA 2013 revealed open graft    Past Surgical History:  Procedure Laterality Date  . APPENDECTOMY    . CARDIAC CATHETERIZATION N/A 10/04/2015   Procedure: Right/Left Heart Cath and Coronary Angiography;  Surgeon: Belva Crome, MD;  Location: [facility];  Service: Cardiovascular;  Laterality: N/A;  . CAROTID-SUBCLAVIAN BYPASS GRAFT Left 1999   for [place] steal syndrome  . CHOLECYSTECTOMY    . COLONOSCOPY    . CYSTECTOMY Left    hand  . ELBOW SURGERY Left   . KNEE SURGERY Left 2014  . MITRAL VALVE REPAIR N/A 10/10/2015   Procedure: MITRAL VALVE REPAIR (MVR) WITH SIZE 28 CARPENTIER-EDWARDS PHYSIO II ANNULOPLASTY RING;  Surgeon: [name], MD;  Location: [facility];  Service: Open Heart Surgery;  Laterality: N/A;  . ROTATOR CUFF REPAIR Left   . TEAR DUCT PROBING  07/2014  . TEE WITHOUT CARDIOVERSION N/A 10/03/2015   Procedure: TRANSESOPHAGEAL ECHOCARDIOGRAM (TEE);  Surgeon: Larey Dresser, MD;  Location: [facility];  Service: Cardiovascular;  Laterality: N/A;  . TEE WITHOUT CARDIOVERSION N/A 10/10/2015   Procedure: TRANSESOPHAGEAL ECHOCARDIOGRAM (TEE);  Surgeon: [name], MD;  Location: [facility];  Service: Open Heart Surgery;  Laterality: N/A;   . TONSILLECTOMY    . TUBAL LIGATION      Review of systems negative except as noted in HPI / PMHx or noted below:  Review of Systems  Constitutional: Negative.   HENT: Negative.   Eyes: Negative.   Respiratory: Negative.   Cardiovascular: Negative.   Gastrointestinal: Negative.   Genitourinary: Negative.   Musculoskeletal: Negative.   Skin: Negative.   Neurological: Negative.   Endo/Heme/Allergies: Negative.   Psychiatric/Behavioral: Negative.      Objective:   Vitals:   12/25/20 1515  BP: 130/82  Pulse: 89  Resp: 16  Temp: 98.1 F (36.7 C)  SpO2: 97%   Height: 5' 1.5" (156.2 cm)  Weight: 118 lb 3.2 oz (53.6 kg)   Physical Exam Constitutional:      Appearance: She is not diaphoretic.  HENT:     Head: Normocephalic.     Right Ear: Tympanic membrane, ear canal and external ear normal.     Left Ear: Tympanic membrane, ear canal and external ear normal.     Nose: Nose normal. No mucosal edema or rhinorrhea.     Mouth/Throat:     Mouth: Oropharynx is clear and moist and mucous membranes are normal.     Pharynx: Uvula midline. No oropharyngeal exudate.  Eyes:     Conjunctiva/sclera: Conjunctivae normal.  Neck:     Thyroid: No thyromegaly.     Trachea: Trachea normal. No tracheal tenderness or tracheal deviation.  Cardiovascular:     Rate and Rhythm: Normal rate and regular rhythm.     Heart sounds: Normal heart sounds, S1 normal and S2 normal. No murmur heard.   Pulmonary:     Effort: No respiratory distress.     Breath sounds: Normal breath sounds. No stridor. No wheezing or rales.  Musculoskeletal:        General: No edema.  Lymphadenopathy:     Head:     Right side of head: No tonsillar adenopathy.     Left side of head: No tonsillar adenopathy.     Cervical: No cervical adenopathy.  Skin:    Findings: No erythema or rash.     Nails: There is no clubbing.  Neurological:     Mental Status: She is alert.     Diagnostics: none  Assessment and  Plan:   1. Other allergic rhinitis   2. LPRD (laryngopharyngeal reflux disease)   3. Other emphysema ([place])        1.  If needed:    A. Albuterol HFA - 2 inhalations or Duoneb nebulization every 6 hours    B. Loratadine 10 mg one time per day  C. Nasal ipratropium 0.06 - 1-2 sprays each nostril every 6 hours   D. Omeprazole 40 mg tablet 1 time per day  2. Return to clinic in 1 year or earlier if problem  Bettye is really doing very well and I think that there is no need for her to utilize any of these medications listed above on a consistent basis and she is welcome to use them should they be required.  Assuming she does well with this plan I will see her back in this clinic in 1 year or earlier if there is a problem.  Allena Katz, MD Allergy / Immunology [place]

## 2020-12-26 ENCOUNTER — Encounter: Payer: Self-pay | Admitting: Allergy and Immunology

## 2021-01-15 NOTE — Addendum Note (Signed)
Addended by: Marrion Coy on: 01/15/2021 01:45 PM   Modules accepted: Orders

## 2021-01-28 ENCOUNTER — Other Ambulatory Visit: Payer: Self-pay

## 2021-01-28 ENCOUNTER — Other Ambulatory Visit (INDEPENDENT_AMBULATORY_CARE_PROVIDER_SITE_OTHER): Payer: Medicare Other

## 2021-01-28 DIAGNOSIS — M19042 Primary osteoarthritis, left hand: Secondary | ICD-10-CM

## 2021-01-28 DIAGNOSIS — E538 Deficiency of other specified B group vitamins: Secondary | ICD-10-CM | POA: Diagnosis not present

## 2021-01-28 DIAGNOSIS — M810 Age-related osteoporosis without current pathological fracture: Secondary | ICD-10-CM

## 2021-01-28 DIAGNOSIS — G629 Polyneuropathy, unspecified: Secondary | ICD-10-CM | POA: Diagnosis not present

## 2021-01-28 DIAGNOSIS — M19041 Primary osteoarthritis, right hand: Secondary | ICD-10-CM | POA: Diagnosis not present

## 2021-01-28 DIAGNOSIS — Z79899 Other long term (current) drug therapy: Secondary | ICD-10-CM

## 2021-01-28 DIAGNOSIS — Z9889 Other specified postprocedural states: Secondary | ICD-10-CM

## 2021-01-28 DIAGNOSIS — E785 Hyperlipidemia, unspecified: Secondary | ICD-10-CM | POA: Diagnosis not present

## 2021-01-28 LAB — HEPATIC FUNCTION PANEL
ALT: 17 U/L (ref 0–35)
AST: 18 U/L (ref 0–37)
Albumin: 3.9 g/dL (ref 3.5–5.2)
Alkaline Phosphatase: 65 U/L (ref 39–117)
Bilirubin, Direct: 0.1 mg/dL (ref 0.0–0.3)
Total Bilirubin: 0.8 mg/dL (ref 0.2–1.2)
Total Protein: 6.7 g/dL (ref 6.0–8.3)

## 2021-01-28 LAB — BASIC METABOLIC PANEL
BUN: 14 mg/dL (ref 6–23)
CO2: 30 mEq/L (ref 19–32)
Calcium: 9.7 mg/dL (ref 8.4–10.5)
Chloride: 108 mEq/L (ref 96–112)
Creatinine, Ser: 0.82 mg/dL (ref 0.40–1.20)
GFR: 63.23 mL/min (ref >60.00)
Glucose, Bld: 91 mg/dL (ref 70–99)
Potassium: 5 mEq/L (ref 3.5–5.1)
Sodium: 140 mEq/L (ref 135–145)

## 2021-01-28 LAB — VITAMIN B12: Vitamin B-12: 596 pg/mL (ref 211–911)

## 2021-01-28 LAB — CBC WITH DIFFERENTIAL/PLATELET
Basophils Absolute: 0 10*3/uL (ref 0.0–0.1)
Basophils Relative: 0.6 % (ref 0.0–3.0)
Eosinophils Absolute: 0.1 10*3/uL (ref 0.0–0.7)
Eosinophils Relative: 2.1 % (ref 0.0–5.0)
HCT: 42.2 % (ref 36.0–46.0)
Hemoglobin: 14.2 g/dL (ref 12.0–15.0)
Lymphocytes Relative: 20 % (ref 12.0–46.0)
Lymphs Abs: 1.1 10*3/uL (ref 0.7–4.0)
MCHC: 33.6 g/dL (ref 30.0–36.0)
MCV: 97.7 fl (ref 78.0–100.0)
Monocytes Absolute: 0.6 10*3/uL (ref 0.1–1.0)
Monocytes Relative: 10.1 % (ref 3.0–12.0)
Neutro Abs: 3.8 10*3/uL (ref 1.4–7.7)
Neutrophils Relative %: 67.2 % (ref 43.0–77.0)
Platelets: 178 10*3/uL (ref 150.0–400.0)
RBC: 4.32 Mil/uL (ref 3.87–5.11)
RDW: 13.7 % (ref 11.5–15.5)
WBC: 5.6 10*3/uL (ref 4.0–10.5)

## 2021-01-28 LAB — LIPID PANEL
Cholesterol: 158 mg/dL (ref 0–200)
HDL: 65.5 mg/dL (ref >39.00)
LDL Cholesterol: 73 mg/dL (ref 0–99)
NonHDL: 92.88
Total CHOL/HDL Ratio: 2
Triglycerides: 100 mg/dL (ref 0.0–149.0)
VLDL: 20 mg/dL (ref 0.0–40.0)

## 2021-01-28 LAB — TSH: TSH: 1.84 u[IU]/mL (ref 0.35–4.50)

## 2021-01-28 NOTE — Progress Notes (Signed)
All results are in range can review at her upcoming visit

## 2021-02-05 DIAGNOSIS — M7130 Other bursal cyst, unspecified site: Secondary | ICD-10-CM | POA: Diagnosis not present

## 2021-02-05 DIAGNOSIS — L570 Actinic keratosis: Secondary | ICD-10-CM | POA: Diagnosis not present

## 2021-02-05 DIAGNOSIS — D1801 Hemangioma of skin and subcutaneous tissue: Secondary | ICD-10-CM | POA: Diagnosis not present

## 2021-02-05 DIAGNOSIS — Z85828 Personal history of other malignant neoplasm of skin: Secondary | ICD-10-CM | POA: Diagnosis not present

## 2021-02-15 ENCOUNTER — Other Ambulatory Visit: Payer: Self-pay | Admitting: Internal Medicine

## 2021-02-25 ENCOUNTER — Encounter: Payer: Self-pay | Admitting: Internal Medicine

## 2021-02-25 ENCOUNTER — Other Ambulatory Visit: Payer: Self-pay

## 2021-02-25 ENCOUNTER — Other Ambulatory Visit: Payer: Self-pay | Admitting: Internal Medicine

## 2021-02-25 ENCOUNTER — Ambulatory Visit (INDEPENDENT_AMBULATORY_CARE_PROVIDER_SITE_OTHER): Payer: Medicare Other | Admitting: Internal Medicine

## 2021-02-25 VITALS — BP 120/72 | HR 82 | Temp 97.5°F | Ht 61.5 in | Wt 121.8 lb

## 2021-02-25 DIAGNOSIS — M19041 Primary osteoarthritis, right hand: Secondary | ICD-10-CM | POA: Diagnosis not present

## 2021-02-25 DIAGNOSIS — Z7901 Long term (current) use of anticoagulants: Secondary | ICD-10-CM

## 2021-02-25 DIAGNOSIS — M19042 Primary osteoarthritis, left hand: Secondary | ICD-10-CM

## 2021-02-25 DIAGNOSIS — M810 Age-related osteoporosis without current pathological fracture: Secondary | ICD-10-CM | POA: Diagnosis not present

## 2021-02-25 DIAGNOSIS — E785 Hyperlipidemia, unspecified: Secondary | ICD-10-CM | POA: Diagnosis not present

## 2021-02-25 DIAGNOSIS — Z79899 Other long term (current) drug therapy: Secondary | ICD-10-CM

## 2021-02-25 DIAGNOSIS — I480 Paroxysmal atrial fibrillation: Secondary | ICD-10-CM | POA: Diagnosis not present

## 2021-02-25 DIAGNOSIS — Z8616 Personal history of COVID-19: Secondary | ICD-10-CM | POA: Diagnosis not present

## 2021-02-25 NOTE — Patient Instructions (Signed)
Blood   Work is  Good and stable .   Exam stable.   Immunization are UTD .     Your heart  rate is regular today .  Plan  6 months.

## 2021-02-25 NOTE — Progress Notes (Signed)
Chief Complaint  Patient presents with  . Follow-up    F/u medications.      HPI: Amy Fischer 87 y.o. come in for Chronic disease management   Had covid  Thanksgiving   And had  imcaby infusion.  Cough was quite severe but she was not hospitalized since that time  taste   came back.  Still coughing  but not badly and no change in breathing. Still tired.  Breathing no difference  On Eliquis.  No unusual bleeding except of scratches self.  No syncope or falling Is still on Prolia has arthritis uses cane assistance if needed. Still sees the allergist seems to be doing pretty well. Coming up on her 89th birthday. Has skin cancer area on her left cheek using topicals also has left eye tearing which makes the treatment difficult. ROS: See pertinent positives and negatives per HPI.  Past Medical History:  Diagnosis Date  . Allergy   . Anemia   . Asymptomatic carotid artery stenosis    R ICA 40% stenosis on CTA 12/2011.  . Bladder polyps   . Cataract    BILATERAL-REMOVED  . CHF (congestive heart failure) ([place])   . Diverticulosis   . Elevated blood pressure 03/25/2011   Bp readings borderline today has hx of elvation  In office and ok at home   Not checke recently   She gfeels was elevated from anxiety    . Episodic recurrent vertigo    MRI Head 2003  . Esophageal spasm   . GERD (gastroesophageal reflux disease)   . Hepatic hemangioma   . History of hepatitis    unknown type  . Hyperlipidemia   . Hyperplastic colon polyp   . Hypothyroidism   . Intestinal metaplasia of gastric mucosa   . Osteoarthritis   . Osteoporosis   . Patellar fracture 07/13/2012  . Scapular fracture 03/31/2012   small from direct blow with trip    . Subclavian steal syndrome    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; CTA 2013 revealed open graft    Family History  Problem Relation Age of Onset  . Hypertension Mother   . Stroke Mother   . Heart disease Father   . Colon cancer Neg Hx   .  Neurofibromatosis Neg Hx   . Allergic rhinitis Neg Hx   . Asthma Neg Hx   . Angioedema Neg Hx   . Atopy Neg Hx   . Eczema Neg Hx   . Immunodeficiency Neg Hx   . Urticaria Neg Hx     Social History   Socioeconomic History  . Marital status: Widowed    Spouse name: Not on file  . Number of children: 4  . Years of education: Not on file  . Highest education level: Bachelor's degree (e.g., BA, AB, BS)  Occupational History  . Occupation: retired Pharmacist, hospital  Tobacco Use  . Smoking status: Former Smoker    Packs/day: 0.75    Years: 20.00    Pack years: 15.00    Quit date: 01/05/1991    Years since quitting: 30.1  . Smokeless tobacco: Never Used  Vaping Use  . Vaping Use: Never used  Substance and Sexual Activity  . Alcohol use: Yes    Alcohol/week: 4.0 standard drinks    Types: 4 Glasses of wine per week    Comment: socially  . Drug use: No  . Sexual activity: Not on file  Other Topics Concern  . Not on file  Social History Narrative   Widowed.  Lives alone in a one story home.  Has 4 children (3 living).  Retired [employer]    HH of 1    No pets   Former smoker   Exercises regularly   Right handed   Social Determinants of Health   Financial Resource Strain: Low Risk   . Difficulty of Paying Living Expenses: Not hard at all  Food Insecurity: No Food Insecurity  . Worried About Charity fundraiser in the Last Year: Never true  . Ran Out of Food in the Last Year: Never true  Transportation Needs: No Transportation Needs  . Lack of Transportation (Medical): No  . Lack of Transportation (Non-Medical): No  Physical Activity: Inactive  . Days of Exercise per Week: 0 days  . Minutes of Exercise per Session: 0 min  Stress: No Stress Concern Present  . Feeling of Stress : Only a little  Social Connections: Moderately Integrated  . Frequency of Communication with Friends and Family: More than three times a week  . Frequency of Social Gatherings with Friends and  Family: Three times a week  . Attends Religious Services: More than 4 times per year  . Active Member of Clubs or Organizations: Yes  . Attends Archivist Meetings: 1 to 4 times per year  . Marital Status: Widowed    Outpatient Medications Prior to Visit  Medication Sig Dispense Refill  . cholecalciferol (VITAMIN D) 1000 units tablet Take 1,000 Units by mouth daily.    . DiphenhydrAMINE HCl (BENADRYL PO) Take by mouth.    . ELIQUIS 2.5 MG TABS tablet TAKE 1 TABLET TWICE A DAY 180 tablet 1  . ezetimibe (ZETIA) 10 MG tablet TAKE 1 TABLET DAILY 90 tablet 3  . fish oil-omega-3 fatty acids 1000 MG capsule Take 1 g by mouth daily.    . folic acid (FOLVITE) 1 MG tablet TAKE 1 TABLET DAILY 90 tablet 0  . ipratropium (ATROVENT) 0.06 % nasal spray Place 1 spray into both nostrils 2 (two) times daily as needed for rhinitis. 45 mL 5  . levothyroxine (SYNTHROID) 100 MCG tablet TAKE 1 TABLET EVERY MORNING ON AN EMPTY STOMACH 7 tablet 1  . Melatonin 3 MG TABS Take 3 mg by mouth at bedtime.     . MULTIPLE VITAMIN PO Take 1 tablet by mouth daily. 50+ Senior Vitamin Daily    . omeprazole (PRILOSEC) 40 MG capsule TAKE 1 CAPSULE DAILY 90 capsule 1  . Polyethyl Glycol-Propyl Glycol (SYSTANE OP) Place 1 drop into both eyes 2 (two) times daily. Use 1-2 Drops in both eyes    . rosuvastatin (CRESTOR) 5 MG tablet TAKE ONE-HALF (1/2) TABLET DAILY 90 tablet 3  . SALINE NASAL SPRAY NA Place 1 spray into both nostrils 2 (two) times daily as needed (congestion).     . vitamin B-12 (CYANOCOBALAMIN) 1000 MCG tablet Take 1,000 mcg by mouth daily.    [name] albuterol (VENTOLIN HFA) 108 (90 Base) MCG/ACT inhaler Inhale 2 puffs into the lungs every 6 (six) hours as needed for wheezing or shortness of breath. (Patient not taking: Reported on 02/25/2021) 18 g 2  . amiodarone (PACERONE) 200 MG tablet TAKE 1 TABLET DAILY (Patient not taking: Reported on 02/25/2021) 90 tablet 3  . glucosamine-chondroitin 500-400 MG tablet Take 1  tablet by mouth daily. (Patient not taking: Reported on 02/25/2021)    . ondansetron (ZOFRAN) 4 MG tablet Take 1 tablet (4 mg total) by mouth every  6 (six) hours. (Patient not taking: Reported on 02/25/2021) 12 tablet 0  . triamcinolone (NASACORT) 55 MCG/ACT AERO nasal inhaler Place 1 spray into the nose daily. (Patient not taking: Reported on 02/25/2021)     No facility-administered medications prior to visit.     EXAM:    BP 120/72   Pulse 82   Temp (!) 97.5 F (36.4 C) (Oral)   Ht 5' 1.5" (1.562 m)   Wt 121 lb 12.8 oz (55.2 kg)   SpO2 98%   BMI 22.64 kg/m   Body mass index is 22.64 kg/m.  GENERAL: vitals reviewed and listed above, alert, oriented, appears well hydrated and in no acute distress HEENT: atraumatic, conjunctiva  clear, no obvious abnormalities on inspection of external nose and ears OP :masked  NECK: no obvious masses on inspection palpation  LUNGS: clear to auscultation bilaterally, no wheezes, rales or rhonchi, good air movement no cough CV: HRRR, no clubbing cyanosis or  peripheral edema nl cap refill  MS: moves all extremities has obvious osteoarthritis changes PSYCH: pleasant and cooperative, no obvious depression or anxiety cognition appears to be normal. Lab Results  Component Value Date   WBC 5.6 01/28/2021   HGB 14.2 01/28/2021   HCT 42.2 01/28/2021   PLT 178.0 01/28/2021   GLUCOSE 91 01/28/2021   CHOL 158 01/28/2021   TRIG 100.0 01/28/2021   HDL 65.50 01/28/2021   LDLDIRECT 76.0 08/19/2019   LDLCALC 73 01/28/2021   ALT 17 01/28/2021   AST 18 01/28/2021   NA 140 01/28/2021   K 5.0 01/28/2021   CL 108 01/28/2021   CREATININE 0.82 01/28/2021   BUN 14 01/28/2021   CO2 30 01/28/2021   TSH 1.84 01/28/2021   INR 1.02 01/12/2016   HGBA1C 5.1 10/14/2016   BP Readings from Last 3 Encounters:  02/25/21 120/72  12/25/20 130/82  11/26/20 (!) 140/57   Lab Results  Component Value Date   VITAMINB12 596 01/28/2021    Lab results reviewed from  January. ASSESSMENT AND PLAN:  Discussed the following assessment and plan:  Medication management  Arthritis of both hands  Hyperlipidemia, unspecified hyperlipidemia type  Osteoporosis, unspecified osteoporosis type, unspecified pathological fracture presence - on prolia  to continue  Anticoagulant long-term use  History of COVID-19  PAF (paroxysmal atrial fibrillation) ([place]) - currently reg rhythym  -Patient advised to return or notify health care team  if  new concerns arise.  Patient Instructions  Blood   Work is  Good and stable .   Exam stable.   Immunization are UTD .     Your heart  rate is regular today .  Plan  6 months.         [name]. Anamaria Dusenbury M.D.

## 2021-04-08 ENCOUNTER — Telehealth: Payer: Self-pay | Admitting: Internal Medicine

## 2021-04-08 NOTE — Telephone Encounter (Signed)
Patient calling to set up appointment for prolia. Please Advise.[phone]

## 2021-04-10 NOTE — Telephone Encounter (Signed)
Left detailed message on machine for patient. Okay to schedule Prolia. Patient scheduled for 04/11/21

## 2021-04-11 ENCOUNTER — Ambulatory Visit (INDEPENDENT_AMBULATORY_CARE_PROVIDER_SITE_OTHER): Payer: Medicare Other | Admitting: *Deleted

## 2021-04-11 ENCOUNTER — Other Ambulatory Visit: Payer: Self-pay

## 2021-04-11 DIAGNOSIS — M810 Age-related osteoporosis without current pathological fracture: Secondary | ICD-10-CM | POA: Diagnosis not present

## 2021-04-11 MED ORDER — DENOSUMAB 60 MG/ML ~~LOC~~ SOSY
60.0000 mg | PREFILLED_SYRINGE | Freq: Once | SUBCUTANEOUS | Status: AC
  Administered 2021-04-11: 60 mg via SUBCUTANEOUS

## 2021-04-11 NOTE — Progress Notes (Signed)
Per orders of Dr. Hernandez, injection of Prolia given by Henlee Donovan. Patient tolerated injection well. 

## 2021-05-14 ENCOUNTER — Other Ambulatory Visit: Payer: Self-pay | Admitting: Cardiology

## 2021-05-14 NOTE — Telephone Encounter (Signed)
29f, 55.2kg, scr 0.82 01/28/21, lovw/crenshaw 10/22/20

## 2021-05-24 ENCOUNTER — Other Ambulatory Visit: Payer: Self-pay | Admitting: Internal Medicine

## 2021-05-30 ENCOUNTER — Other Ambulatory Visit: Payer: Self-pay | Admitting: Internal Medicine

## 2021-06-04 ENCOUNTER — Telehealth: Payer: Self-pay | Admitting: Internal Medicine

## 2021-06-04 NOTE — Telephone Encounter (Signed)
Pt is calling in wanting to know if it is okay for her to get the 2nd COVID booster shot.  Pt stated that she has had COVID and infusion (6 months ago last part of November per the pt).  She was wanting to have the advise of the provider is she should get the 2nd one.  Pt would like to wait on Dr. Regis Bill advise on this matter and would like to have a call back.

## 2021-06-11 NOTE — Telephone Encounter (Signed)
Patient informed of the results and verbalized understanding. Nothing further needed at this time.

## 2021-06-11 NOTE — Telephone Encounter (Signed)
I am not aware of any adequate data to direct the answer to this question. Even in the older age group recovering infection we will give immunity similar to a booster.  I am not aware of any studies of risk benefit of immunization in your situation  I would leave it up to you but  would wait at least 6 months since the infection.  And recovery.maybe 9 mos ? If you are traveling could do 2 weeks before travel.

## 2021-07-08 ENCOUNTER — Other Ambulatory Visit: Payer: Self-pay

## 2021-07-08 MED ORDER — ALBUTEROL SULFATE HFA 108 (90 BASE) MCG/ACT IN AERS: 2.0000 | INHALATION_SPRAY | Freq: Four times a day (QID) | RESPIRATORY_TRACT | 1 refills | Status: DC | PRN

## 2021-07-08 NOTE — Telephone Encounter (Signed)
Patient called because her albuterol inhaler expired last year. She is due back 12/24/21 for her yearly appointment with [facility]. I [facility] request.

## 2021-07-15 ENCOUNTER — Other Ambulatory Visit: Payer: Self-pay | Admitting: Internal Medicine

## 2021-08-22 ENCOUNTER — Other Ambulatory Visit: Payer: Self-pay | Admitting: Internal Medicine

## 2021-08-22 NOTE — Progress Notes (Signed)
Chief Complaint  Patient presents with   Follow-up    HPI: Amy Fischer 87 y.o. come in for Chronic disease management   Losing weight  not sure why .  Not walking  mostly from balance concerns .  do stepper  3 - 4 x per week  ocass  wegiths  bike.  On 1/2 dose of amiodarone  thyroid med  regular  Large pills  a problem  to swallow  but no dyphagia with food  On prolia and no fractures  NO new cv sx   "I am old"  ROS: See pertinent positives and negatives per HPI. No vomiting darrhea eats pretty well. Self care  family plays bridge  no night sweats   Past Medical History:  Diagnosis Date   Allergy    Anemia    Asymptomatic carotid artery stenosis    R ICA 40% stenosis on CTA 12/2011.   Bladder polyps    Cataract    BILATERAL-REMOVED   CHF (congestive heart failure) (HCC)    Diverticulosis    Elevated blood pressure 03/25/2011   Bp readings borderline today has hx of elvation  In office and ok at home   Not checke recently   She gfeels was elevated from anxiety     Episodic recurrent vertigo    MRI Head 2003   Esophageal spasm    GERD (gastroesophageal reflux disease)    Hepatic hemangioma    History of hepatitis    unknown type   Hyperlipidemia    Hyperplastic colon polyp    Hypothyroidism    Intestinal metaplasia of gastric mucosa    Osteoarthritis    Osteoporosis    Patellar fracture 07/13/2012   Scapular fracture 03/31/2012   small from direct blow with trip     Subclavian steal syndrome    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; CTA 2013 revealed open graft    Family History  Problem Relation Age of Onset   Hypertension Mother    Stroke Mother    Heart disease Father    Colon cancer Neg Hx    Neurofibromatosis Neg Hx    Allergic rhinitis Neg Hx    Asthma Neg Hx    Angioedema Neg Hx    Atopy Neg Hx    Eczema Neg Hx    Immunodeficiency Neg Hx    Urticaria Neg Hx     Social History   Socioeconomic History   Marital status: Widowed     Spouse name: Not on file   Number of children: 4   Years of education: Not on file   Highest education level: Bachelor's degree (e.g., BA, AB, BS)  Occupational History   Occupation: retired Pharmacist, hospital  Tobacco Use   Smoking status: Former    Packs/day: 0.75    Years: 20.00    Pack years: 15.00    Types: Cigarettes    Quit date: 01/05/1991    Years since quitting: 30.6   Smokeless tobacco: Never  Vaping Use   Vaping Use: Never used  Substance and Sexual Activity   Alcohol use: Yes    Alcohol/week: 4.0 standard drinks    Types: 4 Glasses of wine per week    Comment: socially   Drug use: No   Sexual activity: Not on file  Other Topics Concern   Not on file  Social History Narrative   Widowed.  Lives alone in a one story home.  Has 4 children (3 living).  Retired  first grade teacher.    HH of 1    No pets   Former smoker   Exercises regularly   Right handed   Social Determinants of Radio broadcast assistant Strain: Low Risk    Difficulty of Paying Living Expenses: Not hard at all  Food Insecurity: No Food Insecurity   Worried About Charity fundraiser in the Last Year: Never true   Arboriculturist in the Last Year: Never true  Transportation Needs: No Transportation Needs   Lack of Transportation (Medical): No   Lack of Transportation (Non-Medical): No  Physical Activity: Inactive   Days of Exercise per Week: 0 days   Minutes of Exercise per Session: 0 min  Stress: No Stress Concern Present   Feeling of Stress : Only a little  Social Connections: Moderately Integrated   Frequency of Communication with Friends and Family: More than three times a week   Frequency of Social Gatherings with Friends and Family: Three times a week   Attends Religious Services: More than 4 times per year   Active Member of Clubs or Organizations: Yes   Attends Archivist Meetings: 1 to 4 times per year   Marital Status: Widowed    Outpatient Medications Prior to Visit   Medication Sig Dispense Refill   albuterol (VENTOLIN HFA) 108 (90 Base) MCG/ACT inhaler Inhale 2 puffs into the lungs every 6 (six) hours as needed for wheezing or shortness of breath. 18 g 1   amiodarone (PACERONE) 200 MG tablet TAKE 1 TABLET DAILY 90 tablet 3   cholecalciferol (VITAMIN D) 1000 units tablet Take 1,000 Units by mouth daily.     DiphenhydrAMINE HCl (BENADRYL PO) Take by mouth.     ELIQUIS 2.5 MG TABS tablet TAKE 1 TABLET TWICE A DAY 180 tablet 1   ezetimibe (ZETIA) 10 MG tablet TAKE 1 TABLET DAILY 90 tablet 3   fish oil-omega-3 fatty acids 1000 MG capsule Take 1 g by mouth daily.     folic acid (FOLVITE) 1 MG tablet TAKE 1 TABLET DAILY 90 tablet 3   glucosamine-chondroitin 500-400 MG tablet Take 1 tablet by mouth daily.     ipratropium (ATROVENT) 0.06 % nasal spray Place 1 spray into both nostrils 2 (two) times daily as needed for rhinitis. 45 mL 5   levothyroxine (SYNTHROID) 100 MCG tablet TAKE 1 TABLET EVERY MORNING ON AN EMPTY STOMACH 90 tablet 0   Melatonin 3 MG TABS Take 3 mg by mouth at bedtime.      MULTIPLE VITAMIN PO Take 1 tablet by mouth daily. 50+ Senior Vitamin Daily     omeprazole (PRILOSEC) 40 MG capsule TAKE 1 CAPSULE DAILY 90 capsule 1   ondansetron (ZOFRAN) 4 MG tablet Take 1 tablet (4 mg total) by mouth every 6 (six) hours. 12 tablet 0   Polyethyl Glycol-Propyl Glycol (SYSTANE OP) Place 1 drop into both eyes 2 (two) times daily. Use 1-2 Drops in both eyes     rosuvastatin (CRESTOR) 5 MG tablet TAKE ONE-HALF (1/2) TABLET DAILY 90 tablet 1   SALINE NASAL SPRAY NA Place 1 spray into both nostrils 2 (two) times daily as needed (congestion).      triamcinolone (NASACORT) 55 MCG/ACT AERO nasal inhaler Place 1 spray into the nose daily.     vitamin B-12 (CYANOCOBALAMIN) 1000 MCG tablet Take 1,000 mcg by mouth daily.     No facility-administered medications prior to visit.     EXAM:  BP 130/70 (BP  Location: Left Arm, Patient Position: Sitting, Cuff Size:  Normal)   Pulse 81   Temp 97.9 F (36.6 C) (Oral)   Ht 5' 1.5" (1.562 m)   Wt 114 lb 9.6 oz (52 kg)   SpO2 94%   BMI 21.30 kg/m   Body mass index is 21.3 kg/m. Wt Readings from Last 3 Encounters:  08/26/21 114 lb 9.6 oz (52 kg)  02/25/21 121 lb 12.8 oz (55.2 kg)  12/25/20 118 lb 3.2 oz (53.6 kg)    GENERAL: vitals reviewed and listed above, alert, oriented, appears well hydrated and in no acute distress HEENT: atraumatic, conjunctiva  clear, no obvious abnormalities on inspection of external nose and ears OP : masked  NECK: no obvious masses on inspection palpation  LUNGS: clear to auscultation bilaterally, no wheezes, rales or rhonchi, good air movement CV: HRRR, no clubbing cyanosis or  peripheral edema nl cap refill  Abdomen:  Sof,t normal bowel sounds without hepatosplenomegaly, no guarding rebound or masses no CVA tenderness MS: moves all extremities without noticeable focal   walks with cane   arthritis hands  PSYCH: pleasant and cooperative, no obvious depression or anxiety Lab Results  Component Value Date   WBC 6.3 08/26/2021   HGB 14.0 08/26/2021   HCT 42.3 08/26/2021   PLT 155.0 08/26/2021   GLUCOSE 80 08/26/2021   CHOL 158 01/28/2021   TRIG 100.0 01/28/2021   HDL 65.50 01/28/2021   LDLDIRECT 76.0 08/19/2019   LDLCALC 73 01/28/2021   ALT 14 08/26/2021   AST 18 08/26/2021   NA 141 08/26/2021   K 4.5 08/26/2021   CL 106 08/26/2021   CREATININE 0.85 08/26/2021   BUN 13 08/26/2021   CO2 29 08/26/2021   TSH 5.06 08/26/2021   INR 1.02 01/12/2016   HGBA1C 5.1 10/14/2016   BP Readings from Last 3 Encounters:  08/26/21 130/70  02/25/21 120/72  12/25/20 130/82   Lab Results  Component Value Date   VITAMINB12 596 01/28/2021     ASSESSMENT AND PLAN:  Discussed the following assessment and plan:  Weight loss, unintentional - Plan: CBC with Differential/Platelet, TSH, T4, free, Comprehensive metabolic panel, C-reactive protein, Sedimentation rate,  Sedimentation rate, C-reactive protein, Comprehensive metabolic panel, T4, free, TSH, CBC with Differential/Platelet  Medication management - Plan: CBC with Differential/Platelet, TSH, T4, free, Comprehensive metabolic panel, C-reactive protein, Sedimentation rate, Sedimentation rate, C-reactive protein, Comprehensive metabolic panel, T4, free, TSH, CBC with Differential/Platelet  Arthritis of both hands - Plan: CBC with Differential/Platelet, TSH, T4, free, Comprehensive metabolic panel, C-reactive protein, Sedimentation rate, Sedimentation rate, C-reactive protein, Comprehensive metabolic panel, T4, free, TSH, CBC with Differential/Platelet  Osteoporosis, unspecified osteoporosis type, unspecified pathological fracture presence - Plan: CBC with Differential/Platelet, TSH, T4, free, Comprehensive metabolic panel, C-reactive protein, Sedimentation rate, Sedimentation rate, C-reactive protein, Comprehensive metabolic panel, T4, free, TSH, CBC with Differential/Platelet  Anticoagulant long-term use - Plan: CBC with Differential/Platelet, TSH, T4, free, Comprehensive metabolic panel, C-reactive protein, Sedimentation rate, Sedimentation rate, C-reactive protein, Comprehensive metabolic panel, T4, free, TSH, CBC with Differential/Platelet  Hyperlipidemia, unspecified hyperlipidemia type - Plan: CBC with Differential/Platelet, TSH, T4, free, Comprehensive metabolic panel, C-reactive protein, Sedimentation rate, Sedimentation rate, C-reactive protein, Comprehensive metabolic panel, T4, free, TSH, CBC with Differential/Platelet  Low vitamin B12 level - Plan: CBC with Differential/Platelet, TSH, T4, free, Comprehensive metabolic panel, C-reactive protein, Sedimentation rate, Sedimentation rate, C-reactive protein, Comprehensive metabolic panel, T4, free, TSH, CBC with Differential/Platelet  PAF (paroxysmal atrial fibrillation) (HCC) - Plan: CBC with Differential/Platelet, TSH, T4, free,  Comprehensive metabolic  panel, C-reactive protein, Sedimentation rate, Sedimentation rate, C-reactive protein, Comprehensive metabolic panel, T4, free, TSH, CBC with Differential/Platelet  Neuropathy - Plan: CBC with Differential/Platelet, TSH, T4, free, Comprehensive metabolic panel, C-reactive protein, Sedimentation rate, Sedimentation rate, C-reactive protein, Comprehensive metabolic panel, T4, free, TSH, CBC with Differential/Platelet Update lab  r/o metabolic     Will ask pharmacy team about  difficult swallow  given pills   ? Not sure why on omega 3 unless via cards  ( one less large  Plan rov 3-4 mos   -Patient advised to return or notify health care team  if  new concerns arise.  Patient Instructions   Add on activity  chair exercise also .  Checking thyroid and blood count etc,    Plan fu depending and follow weight .  Will get pharmacy team to see if can help with pills.  May be stop the fish oil unless  ordered by specialist  Plan fu    [name]. Antionette Luster M.D.

## 2021-08-26 ENCOUNTER — Ambulatory Visit (INDEPENDENT_AMBULATORY_CARE_PROVIDER_SITE_OTHER): Payer: Medicare Other | Admitting: Internal Medicine

## 2021-08-26 ENCOUNTER — Ambulatory Visit: Payer: Medicare Other | Admitting: Neurology

## 2021-08-26 ENCOUNTER — Encounter: Payer: Self-pay | Admitting: Internal Medicine

## 2021-08-26 ENCOUNTER — Other Ambulatory Visit: Payer: Self-pay

## 2021-08-26 VITALS — BP 130/70 | HR 81 | Temp 97.9°F | Ht 61.5 in | Wt 114.6 lb

## 2021-08-26 DIAGNOSIS — R634 Abnormal weight loss: Secondary | ICD-10-CM

## 2021-08-26 DIAGNOSIS — G629 Polyneuropathy, unspecified: Secondary | ICD-10-CM

## 2021-08-26 DIAGNOSIS — M810 Age-related osteoporosis without current pathological fracture: Secondary | ICD-10-CM | POA: Diagnosis not present

## 2021-08-26 DIAGNOSIS — I480 Paroxysmal atrial fibrillation: Secondary | ICD-10-CM | POA: Diagnosis not present

## 2021-08-26 DIAGNOSIS — Z7901 Long term (current) use of anticoagulants: Secondary | ICD-10-CM | POA: Diagnosis not present

## 2021-08-26 DIAGNOSIS — M19041 Primary osteoarthritis, right hand: Secondary | ICD-10-CM | POA: Diagnosis not present

## 2021-08-26 DIAGNOSIS — E785 Hyperlipidemia, unspecified: Secondary | ICD-10-CM

## 2021-08-26 DIAGNOSIS — M19042 Primary osteoarthritis, left hand: Secondary | ICD-10-CM

## 2021-08-26 DIAGNOSIS — Z79899 Other long term (current) drug therapy: Secondary | ICD-10-CM

## 2021-08-26 DIAGNOSIS — E538 Deficiency of other specified B group vitamins: Secondary | ICD-10-CM

## 2021-08-26 LAB — COMPREHENSIVE METABOLIC PANEL
ALT: 14 U/L (ref 0–35)
AST: 18 U/L (ref 0–37)
Albumin: 3.8 g/dL (ref 3.5–5.2)
Alkaline Phosphatase: 65 U/L (ref 39–117)
BUN: 13 mg/dL (ref 6–23)
CO2: 29 mEq/L (ref 19–32)
Calcium: 10 mg/dL (ref 8.4–10.5)
Chloride: 106 mEq/L (ref 96–112)
Creatinine, Ser: 0.85 mg/dL (ref 0.40–1.20)
GFR: 60.32 mL/min (ref >60.00)
Glucose, Bld: 80 mg/dL (ref 70–99)
Potassium: 4.5 mEq/L (ref 3.5–5.1)
Sodium: 141 mEq/L (ref 135–145)
Total Bilirubin: 0.6 mg/dL (ref 0.2–1.2)
Total Protein: 6.7 g/dL (ref 6.0–8.3)

## 2021-08-26 LAB — CBC WITH DIFFERENTIAL/PLATELET
Basophils Absolute: 0 10*3/uL (ref 0.0–0.1)
Basophils Relative: 0.6 % (ref 0.0–3.0)
Eosinophils Absolute: 0.1 10*3/uL (ref 0.0–0.7)
Eosinophils Relative: 2.3 % (ref 0.0–5.0)
HCT: 42.3 % (ref 36.0–46.0)
Hemoglobin: 14 g/dL (ref 12.0–15.0)
Lymphocytes Relative: 15 % (ref 12.0–46.0)
Lymphs Abs: 0.9 10*3/uL (ref 0.7–4.0)
MCHC: 33.1 g/dL (ref 30.0–36.0)
MCV: 97.5 fl (ref 78.0–100.0)
Monocytes Absolute: 0.6 10*3/uL (ref 0.1–1.0)
Monocytes Relative: 8.9 % (ref 3.0–12.0)
Neutro Abs: 4.6 10*3/uL (ref 1.4–7.7)
Neutrophils Relative %: 73.2 % (ref 43.0–77.0)
Platelets: 155 10*3/uL (ref 150.0–400.0)
RBC: 4.34 Mil/uL (ref 3.87–5.11)
RDW: 15.3 % (ref 11.5–15.5)
WBC: 6.3 10*3/uL (ref 4.0–10.5)

## 2021-08-26 LAB — TSH: TSH: 5.06 u[IU]/mL (ref 0.35–5.50)

## 2021-08-26 LAB — SEDIMENTATION RATE: Sed Rate: 9 mm/hr (ref 0–30)

## 2021-08-26 LAB — C-REACTIVE PROTEIN: CRP: <1 mg/dL (ref 0.5–20.0)

## 2021-08-26 LAB — T4, FREE: Free T4: 1.15 ng/dL (ref 0.60–1.60)

## 2021-08-26 NOTE — Patient Instructions (Addendum)
  Add on activity  chair exercise also .  Checking thyroid and blood count etc,    Plan fu depending and follow weight .  Will get pharmacy team to see if can help with pills.  May be stop the fish oil unless  ordered by specialist  Plan fu

## 2021-08-28 ENCOUNTER — Other Ambulatory Visit: Payer: Self-pay | Admitting: Internal Medicine

## 2021-08-29 NOTE — Progress Notes (Signed)
Blood test are good no anemia ;liver thyroid  inflammation markers are all normal . No explanation for  weight change .  Fu as planned but if  contuing to drop weight or new sx then  plan fu sooner.

## 2021-09-03 ENCOUNTER — Ambulatory Visit (INDEPENDENT_AMBULATORY_CARE_PROVIDER_SITE_OTHER): Payer: Medicare Other

## 2021-09-03 ENCOUNTER — Other Ambulatory Visit: Payer: Self-pay

## 2021-09-03 DIAGNOSIS — Z0000 Encounter for general adult medical examination without abnormal findings: Secondary | ICD-10-CM | POA: Diagnosis not present

## 2021-09-03 NOTE — Patient Instructions (Signed)
Amy Fischer , Thank you for taking time to come for your Medicare Wellness Visit. I appreciate your ongoing commitment to your health goals. Please review the following plan we discussed and let me know if I can assist you in the future.   Screening recommendations/referrals: Colonoscopy: No longer required  Mammogram: No longer required  Bone Density: Done 3/2//21 repeat every 2 years  Recommended yearly ophthalmology/optometry visit for glaucoma screening and checkup Recommended yearly dental visit for hygiene and checkup  Vaccinations: Influenza vaccine: Due Pneumococcal vaccine: Completed  Tdap vaccine: Done 09/23/11 repeat every 10 years due 09/22/21 Shingles vaccine: Completed 09/17/18 & 02/02/19   Covid-19:Completed 1/24, 2/15, & 10/19/20  Advanced directives: Please bring a copy of your health care power of attorney and living will to the office at your convenience.  Conditions/risks identified: get more exercise   Next appointment: Follow up in one year for your annual wellness visit    Preventive Care 65 Years and Older, Female Preventive care refers to lifestyle choices and visits with your health care provider that can promote health and wellness. What does preventive care include? A yearly physical exam. This is also called an annual well check. Dental exams once or twice a year. Routine eye exams. Ask your health care provider how often you should have your eyes checked. Personal lifestyle choices, including: Daily care of your teeth and gums. Regular physical activity. Eating a healthy diet. Avoiding tobacco and drug use. Limiting alcohol use. Practicing safe sex. Taking low-dose aspirin every day. Taking vitamin and mineral supplements as recommended by your health care provider. What happens during an annual well check? The services and screenings done by your health care provider during your annual well check will depend on your age, overall health, lifestyle risk  factors, and family history of disease. Counseling  Your health care provider may ask you questions about your: Alcohol use. Tobacco use. Drug use. Emotional well-being. Home and relationship well-being. Sexual activity. Eating habits. History of falls. Memory and ability to understand (cognition). Work and work Statistician. Reproductive health. Screening  You may have the following tests or measurements: Height, weight, and BMI. Blood pressure. Lipid and cholesterol levels. These may be checked every 5 years, or more frequently if you are over 51 years old. Skin check. Lung cancer screening. You may have this screening every year starting at age 2 if you have a 30-pack-year history of smoking and currently smoke or have quit within the past 15 years. Fecal occult blood test (FOBT) of the stool. You may have this test every year starting at age 39. Flexible sigmoidoscopy or colonoscopy. You may have a sigmoidoscopy every 5 years or a colonoscopy every 10 years starting at age 30. Hepatitis C blood test. Hepatitis B blood test. Sexually transmitted disease (STD) testing. Diabetes screening. This is done by checking your blood sugar (glucose) after you have not eaten for a while (fasting). You may have this done every 1-3 years. Bone density scan. This is done to screen for osteoporosis. You may have this done starting at age 10. Mammogram. This may be done every 1-2 years. Talk to your health care provider about how often you should have regular mammograms. Talk with your health care provider about your test results, treatment options, and if necessary, the need for more tests. Vaccines  Your health care provider may recommend certain vaccines, such as: Influenza vaccine. This is recommended every year. Tetanus, diphtheria, and acellular pertussis (Tdap, Td) vaccine. You may need a  Td booster every 10 years. Zoster vaccine. You may need this after age 24. Pneumococcal 13-valent  conjugate (PCV13) vaccine. One dose is recommended after age 72. Pneumococcal polysaccharide (PPSV23) vaccine. One dose is recommended after age 55. Talk to your health care provider about which screenings and vaccines you need and how often you need them. This information is not intended to replace advice given to you by your health care provider. Make sure you discuss any questions you have with your health care provider. Document Released: 01/11/2016 Document Revised: 09/03/2016 Document Reviewed: 10/16/2015 Elsevier Interactive Patient Education  2017 [place] Prevention in the Home Falls can cause injuries. They can happen to people of all ages. There are many things you can do to make your home safe and to help prevent falls. What can I do on the outside of my home? Regularly fix the edges of walkways and driveways and fix any cracks. Remove anything that might make you trip as you walk through a door, such as a raised step or threshold. Trim any bushes or trees on the path to your home. Use bright outdoor lighting. Clear any walking paths of anything that might make someone trip, such as rocks or tools. Regularly check to see if handrails are loose or broken. Make sure that both sides of any steps have handrails. Any raised decks and porches should have guardrails on the edges. Have any leaves, snow, or ice cleared regularly. Use sand or salt on walking paths during winter. Clean up any spills in your garage right away. This includes oil or grease spills. What can I do in the bathroom? Use night lights. Install grab bars by the toilet and in the tub and shower. Do not use towel bars as grab bars. Use non-skid mats or decals in the tub or shower. If you need to sit down in the shower, use a plastic, non-slip stool. Keep the floor dry. Clean up any water that spills on the floor as soon as it happens. Remove soap buildup in the tub or shower regularly. Attach bath mats  securely with double-sided non-slip rug tape. Do not have throw rugs and other things on the floor that can make you trip. What can I do in the bedroom? Use night lights. Make sure that you have a light by your bed that is easy to reach. Do not use any sheets or blankets that are too big for your bed. They should not hang down onto the floor. Have a firm chair that has side arms. You can use this for support while you get dressed. Do not have throw rugs and other things on the floor that can make you trip. What can I do in the kitchen? Clean up any spills right away. Avoid walking on wet floors. Keep items that you use a lot in easy-to-reach places. If you need to reach something above you, use a strong step stool that has a grab bar. Keep electrical cords out of the way. Do not use floor polish or wax that makes floors slippery. If you must use wax, use non-skid floor wax. Do not have throw rugs and other things on the floor that can make you trip. What can I do with my stairs? Do not leave any items on the stairs. Make sure that there are handrails on both sides of the stairs and use them. Fix handrails that are broken or loose. Make sure that handrails are as long as the stairways. Check any  carpeting to make sure that it is firmly attached to the stairs. Fix any carpet that is loose or worn. Avoid having throw rugs at the top or bottom of the stairs. If you do have throw rugs, attach them to the floor with carpet tape. Make sure that you have a light switch at the top of the stairs and the bottom of the stairs. If you do not have them, ask someone to add them for you. What else can I do to help prevent falls? Wear shoes that: Do not have high heels. Have rubber bottoms. Are comfortable and fit you well. Are closed at the toe. Do not wear sandals. If you use a stepladder: Make sure that it is fully opened. Do not climb a closed stepladder. Make sure that both sides of the stepladder  are locked into place. Ask someone to hold it for you, if possible. Clearly mark and make sure that you can see: Any grab bars or handrails. First and last steps. Where the edge of each step is. Use tools that help you move around (mobility aids) if they are needed. These include: Canes. Walkers. Scooters. Crutches. Turn on the lights when you go into a dark area. Replace any light bulbs as soon as they burn out. Set up your furniture so you have a clear path. Avoid moving your furniture around. If any of your floors are uneven, fix them. If there are any pets around you, be aware of where they are. Review your medicines with your doctor. Some medicines can make you feel dizzy. This can increase your chance of falling. Ask your doctor what other things that you can do to help prevent falls. This information is not intended to replace advice given to you by your health care provider. Make sure you discuss any questions you have with your health care provider. Document Released: 10/11/2009 Document Revised: 05/22/2016 Document Reviewed: 01/19/2015 Elsevier Interactive Patient Education  2017 [employer].

## 2021-09-03 NOTE — Progress Notes (Signed)
Virtual Visit via Telephone Note  I connected with  Amy Fischer on 09/03/21 at  3:15 PM EDT by telephone and verified that I am speaking with the correct person using two identifiers.  Medicare Annual Wellness visit completed telephonically due to Covid-19 pandemic.   Persons participating in this call: This Health Coach and this patient.   Location: Patient: Home Provider: Office   I discussed the limitations, risks, security and privacy concerns of performing an evaluation and management service by telephone and the availability of in person appointments. The patient expressed understanding and agreed to proceed.  Unable to perform video visit due to video visit attempted and failed and/or patient does not have video capability.   Some vital signs may be absent or patient reported.   Willette Brace, LPN   Subjective:   Amy Fischer is a 87 y.o. female who presents for Medicare Annual (Subsequent) preventive examination.  Review of Systems     Cardiac Risk Factors include: advanced age (>94mn, >>87women);hypertension     Objective:    There were no vitals filed for this visit. There is no height or weight on file to calculate BMI.  Advanced Directives 09/03/2021 11/24/2020 08/29/2020 08/24/2020 07/25/2019 07/21/2019 06/12/2016  Does Patient Have a Medical Advance Directive? Yes No Yes No Yes No Yes  [place] - - - - - Living will  Does patient want to make changes to medical advance directive? - - Yes (MAU/Ambulatory/Procedural Areas - Information given) - - - No - Patient declined  Copy of [place]in Chart? No - copy requested - - - - - No - copy requested  Would patient like information on creating a medical advance directive? - - - - - - -  Pre-existing out of facility DNR order (yellow form or pink MOST form) - - - - - - -    Current Medications (verified) Outpatient Encounter Medications  as of 09/03/2021  Medication Sig   albuterol (VENTOLIN HFA) 108 (90 Base) MCG/ACT inhaler Inhale 2 puffs into the lungs every 6 (six) hours as needed for wheezing or shortness of breath.   amiodarone (PACERONE) 200 MG tablet TAKE 1 TABLET DAILY (Patient taking differently: 1/2  tablet)   cholecalciferol (VITAMIN D) 1000 units tablet Take 1,000 Units by mouth daily.   DiphenhydrAMINE HCl (BENADRYL PO) Take by mouth.   ELIQUIS 2.5 MG TABS tablet TAKE 1 TABLET TWICE A DAY   ezetimibe (ZETIA) 10 MG tablet TAKE 1 TABLET DAILY   folic acid (FOLVITE) 1 MG tablet TAKE 1 TABLET DAILY   glucosamine-chondroitin 500-400 MG tablet Take 1 tablet by mouth daily.   ipratropium (ATROVENT) 0.06 % nasal spray Place 1 spray into both nostrils 2 (two) times daily as needed for rhinitis.   levothyroxine (SYNTHROID) 100 MCG tablet TAKE 1 TABLET EVERY MORNING ON AN EMPTY STOMACH   Melatonin 3 MG TABS Take 3 mg by mouth at bedtime.    MULTIPLE VITAMIN PO Take 1 tablet by mouth daily. 50+ Senior Vitamin Daily   omeprazole (PRILOSEC) 40 MG capsule TAKE 1 CAPSULE DAILY   ondansetron (ZOFRAN) 4 MG tablet Take 1 tablet (4 mg total) by mouth every 6 (six) hours.   Polyethyl Glycol-Propyl Glycol (SYSTANE OP) Place 1 drop into both eyes 2 (two) times daily. Use 1-2 Drops in both eyes   rosuvastatin (CRESTOR) 5 MG tablet TAKE ONE-HALF (1/2) TABLET DAILY   SALINE NASAL SPRAY  NA Place 1 spray into both nostrils 2 (two) times daily as needed (congestion).    triamcinolone (NASACORT) 55 MCG/ACT AERO nasal inhaler Place 1 spray into the nose daily.   vitamin B-12 (CYANOCOBALAMIN) 1000 MCG tablet Take 1,000 mcg by mouth daily.   [DISCONTINUED] fish oil-omega-3 fatty acids 1000 MG capsule Take 1 g by mouth daily. (Patient not taking: Reported on 09/03/2021)   [DISCONTINUED] levothyroxine (SYNTHROID) 100 MCG tablet TAKE 1 TABLET EVERY MORNING ON AN EMPTY STOMACH   No facility-administered encounter medications on file as of 09/03/2021.     Allergies (verified) Codeine, Risedronate sodium, Statins, Tape, and Amoxicillin-pot clavulanate   History: Past Medical History:  Diagnosis Date   Allergy    Anemia    Asymptomatic carotid artery stenosis    R ICA 40% stenosis on CTA 12/2011.   Bladder polyps    Cataract    BILATERAL-REMOVED   CHF (congestive heart failure) (HCC)    Diverticulosis    Elevated blood pressure 03/25/2011   Bp readings borderline today has hx of elvation  In office and ok at home   Not checke recently   She gfeels was elevated from anxiety     Episodic recurrent vertigo    MRI Head 2003   Esophageal spasm    GERD (gastroesophageal reflux disease)    Hepatic hemangioma    History of hepatitis    unknown type   Hyperlipidemia    Hyperplastic colon polyp    Hypothyroidism    Intestinal metaplasia of gastric mucosa    Osteoarthritis    Osteoporosis    Patellar fracture 07/13/2012   Scapular fracture 03/31/2012   small from direct blow with trip     Subclavian steal syndrome    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; CTA 2013 revealed open graft   Past Surgical History:  Procedure Laterality Date   APPENDECTOMY     CARDIAC CATHETERIZATION N/A 10/04/2015   Procedure: Right/Left Heart Cath and Coronary Angiography;  Surgeon: Belva Crome, MD;  Location: [facility];  Service: Cardiovascular;  Laterality: N/A;   CAROTID-SUBCLAVIAN BYPASS GRAFT Left 1999   for Lipscomb steal syndrome   CHOLECYSTECTOMY     COLONOSCOPY     CYSTECTOMY Left    hand   ELBOW SURGERY Left    KNEE SURGERY Left 2014   MITRAL VALVE REPAIR N/A 10/10/2015   Procedure: MITRAL VALVE REPAIR (MVR) WITH SIZE 28 CARPENTIER-EDWARDS PHYSIO II ANNULOPLASTY RING;  Surgeon: [name], MD;  Location: [place];  Service: Open Heart Surgery;  Laterality: N/A;   ROTATOR CUFF REPAIR Left    TEAR DUCT PROBING  07/2014   TEE WITHOUT CARDIOVERSION N/A 10/03/2015   Procedure: TRANSESOPHAGEAL ECHOCARDIOGRAM (TEE);  Surgeon:  Larey Dresser, MD;  Location: [place];  Service: Cardiovascular;  Laterality: N/A;   TEE WITHOUT CARDIOVERSION N/A 10/10/2015   Procedure: TRANSESOPHAGEAL ECHOCARDIOGRAM (TEE);  Surgeon: [name], MD;  Location: [place];  Service: Open Heart Surgery;  Laterality: N/A;   TONSILLECTOMY     TUBAL LIGATION     Family History  Problem Relation Age of Onset   Hypertension Mother    Stroke Mother    Heart disease Father    Colon cancer Neg Hx    Neurofibromatosis Neg Hx    Allergic rhinitis Neg Hx    Asthma Neg Hx    Angioedema Neg Hx    Atopy Neg Hx    Eczema Neg Hx  Immunodeficiency Neg Hx    Urticaria Neg Hx    Social History   Socioeconomic History   Marital status: Widowed    Spouse name: Not on file   Number of children: 4   Years of education: Not on file   Highest education level: Bachelor's degree (e.g., BA, AB, BS)  Occupational History   Occupation: retired Pharmacist, hospital  Tobacco Use   Smoking status: Former    Packs/day: 0.75    Years: 20.00    Pack years: 15.00    Types: Cigarettes    Quit date: 01/05/1991    Years since quitting: 30.6   Smokeless tobacco: Never  Vaping Use   Vaping Use: Never used  Substance and Sexual Activity   Alcohol use: Yes    Alcohol/week: 4.0 standard drinks    Types: 4 Glasses of wine per week    Comment: socially   Drug use: No   Sexual activity: Not on file  Other Topics Concern   Not on file  Social History Narrative   Widowed.  Lives alone in a one story home.  Has 4 children (3 living).  Retired first [employer]    HH of 1    No pets   Former smoker   Exercises regularly   Right handed   Social Determinants of Radio broadcast assistant Strain: Low Risk    Difficulty of Paying Living Expenses: Not hard at all  Food Insecurity: No Food Insecurity   Worried About Charity fundraiser in the Last Year: Never true   Arboriculturist in the Last Year: Never true  Transportation Needs: No Transportation  Needs   Lack of Transportation (Medical): No   Lack of Transportation (Non-Medical): No  Physical Activity: Insufficiently Active   Days of Exercise per Week: 4 days   Minutes of Exercise per Session: 10 min  Stress: No Stress Concern Present   Feeling of Stress : Not at all  Social Connections: Moderately Integrated   Frequency of Communication with Friends and Family: More than three times a week   Frequency of Social Gatherings with Friends and Family: Three times a week   Attends Religious Services: More than 4 times per year   Active Member of Clubs or Organizations: Yes   Attends Archivist Meetings: 1 to 4 times per year   Marital Status: Widowed    Tobacco Counseling Counseling given: Not Answered   Clinical Intake:  Pre-visit preparation completed: Yes  Pain : No/denies pain     BMI - recorded: 21.31 Nutritional Status: BMI of 19-24  Normal Nutritional Risks: None Diabetes: No  How often do you need to have someone help you when you read instructions, pamphlets, or other written materials from your doctor or pharmacy?: 1 - Never  Diabetic?No  Interpreter Needed?: No  Information entered by :: Charlott Rakes, LPN   Activities of Daily Living In your present state of health, do you have any difficulty performing the following activities: 09/03/2021  Hearing? N  Vision? N  Difficulty concentrating or making decisions? N  Walking or climbing stairs? Y  Comment [place] but careful going down  Dressing or bathing? N  Doing errands, shopping? N  Preparing Food and eating ? N  Using the Toilet? N  In the past six months, have you accidently leaked urine? N  Do you have problems with loss of bowel control? N  Managing your Medications? N  Managing your Finances? N  Housekeeping  or managing your Housekeeping? N  Some recent data might be hidden    Patient Care Team: Panosh, [name], MD as PCP - General Stanford Breed, Denice Bors, MD as PCP - Cardiology  (Cardiology) Newton Pigg, MD (Obstetrics and Gynecology) Paralee Cancel, MD (Orthopedic Surgery) Jacelyn Pi, MD as Attending Physician (Endocrinology) Everitt Amber, MD as Consulting Physician (Ophthalmology) Stanford Breed Denice Bors, MD as Consulting Physician (Cardiology) [name], MD as Consulting Physician (Cardiothoracic Surgery) Gala Romney, Delena Serve, DO as Consulting Physician (Neurology) Alda Berthold, DO as Consulting Physician (Neurology)  Indicate any recent Medical Services you may have received from other than [facility] providers in the past year (date may be approximate).     Assessment:   This is a routine wellness examination for Blessed.  Hearing/Vision screen Hearing Screening - Comments:: Pt denies any hearing issues  Vision Screening - Comments:: Pt follows up with Dr Gershon Crane for annual eye exams   Dietary issues and exercise activities discussed: Current Exercise Habits: Home exercise routine, Type of exercise: stretching, Time (Minutes): 10, Frequency (Times/Week): 4, Weekly Exercise (Minutes/Week): 40   Goals Addressed             This Visit's Progress    Patient Stated       Do more exercises        Depression Screen PHQ 2/9 Scores 09/03/2021 08/29/2020 02/24/2020 11/05/2018 05/14/2017 04/27/2017 03/18/2017  PHQ - 2 Score 0 0 0 2 0 0 0  PHQ- 9 Score - - - - - - -    Fall Risk Fall Risk  09/03/2021 08/29/2020 08/24/2020 07/21/2019 01/24/2019  Falls in the past year? 0 1 1 0 1  Comment - - - - -  Number falls in past yr: 0 1 0 0 1  Injury with Fall? 0 0 0 0 0  Risk Factor Category  - - - - -  Risk for fall due to : Impaired vision;Impaired balance/gait Impaired balance/gait;Impaired mobility;Impaired vision - - Impaired balance/gait;Impaired mobility  Follow up Falls prevention discussed Falls prevention discussed - - Falls evaluation completed;Education provided;Falls prevention discussed  Comment - - - - -    FALL RISK PREVENTION PERTAINING TO  THE HOME:  Any stairs in or around the home? No  If so, are there any without handrails? No  Home free of loose throw rugs in walkways, pet beds, electrical cords, etc? Yes  Adequate lighting in your home to reduce risk of falls? Yes   ASSISTIVE DEVICES UTILIZED TO PREVENT FALLS:  Life alert? No  Use of a cane, walker or w/c? Yes  Grab bars in the bathroom? Yes  Shower chair or bench in shower? Yes  Elevated toilet seat or a handicapped toilet? Yes   TIMED UP AND GO:  Was the test performed? No .   Cognitive Function: MMSE - Mini Mental State Exam 08/08/2016  Not completed: (No Data)     6CIT Screen 09/03/2021 08/29/2020  What Year? 0 points 0 points  What month? 0 points 0 points  What time? 0 points -  Count back from 20 0 points 0 points  Months in reverse 0 points 0 points  Repeat phrase 0 points 0 points  Total Score 0 -    Immunizations Immunization History  Administered Date(s) Administered   Influenza Split 10/20/2013   Influenza Whole 12/29/2001, 11/09/2007, 09/19/2008, 09/24/2010   Influenza, High Dose Seasonal PF 10/31/2015, 10/06/2016, 09/29/2017, 09/30/2018   Influenza-Unspecified 09/28/2014, 10/02/2020   PFIZER(Purple Top)SARS-COV-2 Vaccination  01/22/2020, 02/13/2020, 10/19/2020   Pneumococcal Conjugate-13 07/26/2014   Pneumococcal Polysaccharide-23 09/18/2009   Td 04/09/1999   Tdap 09/23/2011   Zoster Recombinat (Shingrix) 09/17/2018, 02/02/2019   Zoster, Live 12/29/2010    TDAP status: Up to date  Flu Vaccine status: Due, Education has been provided regarding the importance of this vaccine. Advised may receive this vaccine at local pharmacy or Health Dept. Aware to provide a copy of the vaccination record if obtained from local pharmacy or Health Dept. Verbalized acceptance and understanding.  Pneumococcal vaccine status: Up to date  Covid-19 vaccine status: Completed vaccines  Qualifies for Shingles Vaccine? Yes   Zostavax completed Yes    Shingrix Completed?: Yes  Screening Tests Health Maintenance  Topic Date Due   COVID-19 Vaccine (4 - Booster for Pfizer series) 01/19/2021   INFLUENZA VACCINE  07/29/2021   TETANUS/TDAP  09/22/2021   DEXA SCAN  Completed   PNA vac Low Risk Adult  Completed   Zoster Vaccines- Shingrix  Completed   HPV VACCINES  Aged Out    Health Maintenance  Health Maintenance Due  Topic Date Due   COVID-19 Vaccine (4 - Booster for Pfizer series) 01/19/2021   INFLUENZA VACCINE  07/29/2021    Colorectal cancer screening: No longer required.   Mammogram status: No longer required due to age.  Bone Density status: Completed 02/28/20. Results reflect: Bone density results: OSTEOPOROSIS. Repeat every 2 years.   Additional Screening:  Vision Screening: Recommended annual ophthalmology exams for early detection of glaucoma and other disorders of the eye. Is the patient up to date with their annual eye exam?  Yes  Who is the provider or what is the name of the office in which the patient attends annual eye exams? Dr Gershon Crane If pt is not established with a provider, would they like to be referred to a provider to establish care? No .   Dental Screening: Recommended annual dental exams for proper oral hygiene  Community Resource Referral / Chronic Care Management: CRR required this visit?  No   CCM required this visit?  No      Plan:     I have personally reviewed and noted the following in the patient's chart:   Medical and social history Use of alcohol, tobacco or illicit drugs  Current medications and supplements including opioid prescriptions.  Functional ability and status Nutritional status Physical activity Advanced directives List of other physicians Hospitalizations, surgeries, and ER visits in previous 12 months Vitals Screenings to include cognitive, depression, and falls Referrals and appointments  In addition, I have reviewed and discussed with patient certain  preventive protocols, quality metrics, and best practice recommendations. A written personalized care plan for preventive services as well as general preventive health recommendations were provided to patient.     Willette Brace, LPN   [id number]   Nurse Notes: None

## 2021-09-06 NOTE — Progress Notes (Signed)
Pt is not covered with her insurance.  Thanks  Tati

## 2021-10-17 NOTE — Progress Notes (Signed)
HPI: Follow-up mitral valve repair. Patient hospitalized October 2016 with congestive heart failure. Transesophageal echocardiogram showed normal LV function. There was a flail posterior leaflet with severe mitral regurgitation. There was mild to moderate tricuspid regurgitation. Cardiac catheterization October 2016 showed a 30% LAD, 60% first diagonal and 30% right coronary artery. Ejection fraction 60%. Patient subsequent had mitral valve repair. Note preoperative carotid Dopplers showed 1-39% stenosis. Note the patient had atrial fibrillation after surgery.  Follow-up monitor March 2019 showed sinus rhythm with PVCs and paroxysmal atrial flutter/fibrillation.  Last echocardiogram April 2019 showed normal LV function, moderate left ventricular hypertrophy, prior mitral valve repair and mild tricuspid regurgitation.  Patient placed on amiodarone and apixaban.  Follow-up monitor February 2021 due to presyncope showed sinus with occasional PAC, PVC and brief PAT.  Since last seen, she denies dyspnea, chest pain, palpitations or syncope.  No pedal edema.  No bleeding.  Current Outpatient Medications  Medication Sig Dispense Refill   albuterol (VENTOLIN HFA) 108 (90 Base) MCG/ACT inhaler Inhale 2 puffs into the lungs every 6 (six) hours as needed for wheezing or shortness of breath. 18 g 1   amiodarone (PACERONE) 200 MG tablet TAKE 1 TABLET DAILY (Patient taking differently: 1/2  tablet) 90 tablet 3   cholecalciferol (VITAMIN D) 1000 units tablet Take 1,000 Units by mouth daily.     DiphenhydrAMINE HCl (BENADRYL PO) Take by mouth.     ELIQUIS 2.5 MG TABS tablet TAKE 1 TABLET TWICE A DAY 180 tablet 1   ezetimibe (ZETIA) 10 MG tablet TAKE 1 TABLET DAILY 90 tablet 3   folic acid (FOLVITE) 1 MG tablet TAKE 1 TABLET DAILY 90 tablet 3   glucosamine-chondroitin 500-400 MG tablet Take 1 tablet by mouth daily.     ipratropium (ATROVENT) 0.06 % nasal spray Place 1 spray into both nostrils 2 (two) times  daily as needed for rhinitis. 45 mL 5   levothyroxine (SYNTHROID) 100 MCG tablet TAKE 1 TABLET EVERY MORNING ON AN EMPTY STOMACH 90 tablet 3   Melatonin 3 MG TABS Take 3 mg by mouth at bedtime.      MULTIPLE VITAMIN PO Take 1 tablet by mouth daily. 50+ Senior Vitamin Daily     omeprazole (PRILOSEC) 40 MG capsule TAKE 1 CAPSULE DAILY 90 capsule 1   ondansetron (ZOFRAN) 4 MG tablet Take 1 tablet (4 mg total) by mouth every 6 (six) hours. 12 tablet 0   Polyethyl Glycol-Propyl Glycol (SYSTANE OP) Place 1 drop into both eyes 2 (two) times daily. Use 1-2 Drops in both eyes     rosuvastatin (CRESTOR) 5 MG tablet TAKE ONE-HALF (1/2) TABLET DAILY 90 tablet 1   SALINE NASAL SPRAY NA Place 1 spray into both nostrils 2 (two) times daily as needed (congestion).      triamcinolone (NASACORT) 55 MCG/ACT AERO nasal inhaler Place 1 spray into the nose daily.     vitamin B-12 (CYANOCOBALAMIN) 1000 MCG tablet Take 1,000 mcg by mouth daily.     No current facility-administered medications for this visit.     Past Medical History:  Diagnosis Date   Allergy    Anemia    Asymptomatic carotid artery stenosis    R ICA 40% stenosis on CTA 12/2011.   Bladder polyps    Cataract    BILATERAL-REMOVED   CHF (congestive heart failure) (HCC)    Diverticulosis    Elevated blood pressure 03/25/2011   Bp readings borderline today has hx of elvation  In office  and ok at home   Not checke recently   She gfeels was elevated from anxiety     Episodic recurrent vertigo    MRI Head 2003   Esophageal spasm    GERD (gastroesophageal reflux disease)    Hepatic hemangioma    History of hepatitis    unknown type   Hyperlipidemia    Hyperplastic colon polyp    Hypothyroidism    Intestinal metaplasia of gastric mucosa    Osteoarthritis    Osteoporosis    Patellar fracture 07/13/2012   Scapular fracture 03/31/2012   small from direct blow with trip     Subclavian steal syndrome    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 graft  1999; CTA 2013 revealed open graft    Past Surgical History:  Procedure Laterality Date   APPENDECTOMY     CARDIAC CATHETERIZATION N/A 10/04/2015   Procedure: Right/Left Heart Cath and Coronary Angiography;  Surgeon: Belva Crome, MD;  Location: [place];  Service: Cardiovascular;  Laterality: N/A;   CAROTID-SUBCLAVIAN BYPASS GRAFT Left 1999   for [place] steal syndrome   CHOLECYSTECTOMY     COLONOSCOPY     CYSTECTOMY Left    hand   ELBOW SURGERY Left    KNEE SURGERY Left 2014   MITRAL VALVE REPAIR N/A 10/10/2015   Procedure: MITRAL VALVE REPAIR (MVR) WITH SIZE 28 CARPENTIER-EDWARDS PHYSIO II ANNULOPLASTY RING;  Surgeon: [name], MD;  Location: [place];  Service: Open Heart Surgery;  Laterality: N/A;   ROTATOR CUFF REPAIR Left    TEAR DUCT PROBING  07/2014   TEE WITHOUT CARDIOVERSION N/A 10/03/2015   Procedure: TRANSESOPHAGEAL ECHOCARDIOGRAM (TEE);  Surgeon: Larey Dresser, MD;  Location: [place];  Service: Cardiovascular;  Laterality: N/A;   TEE WITHOUT CARDIOVERSION N/A 10/10/2015   Procedure: TRANSESOPHAGEAL ECHOCARDIOGRAM (TEE);  Surgeon: [name], MD;  Location: [place];  Service: Open Heart Surgery;  Laterality: N/A;   TONSILLECTOMY     TUBAL LIGATION      Social History   Socioeconomic History   Marital status: Widowed    Spouse name: Not on file   Number of children: 4   Years of education: Not on file   Highest education level: Bachelor's degree (e.g., BA, AB, BS)  Occupational History   Occupation: retired Pharmacist, hospital  Tobacco Use   Smoking status: Former    Packs/day: 0.75    Years: 20.00    Pack years: 15.00    Types: Cigarettes    Quit date: 01/05/1991    Years since quitting: 30.8   Smokeless tobacco: Never  Vaping Use   Vaping Use: Never used  Substance and Sexual Activity   Alcohol use: Yes    Alcohol/week: 4.0 standard drinks    Types: 4 Glasses of wine per week    Comment: socially   Drug use: No   Sexual activity: Not  on file  Other Topics Concern   Not on file  Social History Narrative   Widowed.  Lives alone in a one story home.  Has 4 children (3 living).  Retired first [employer].    HH of 1    No pets   Former smoker   Exercises regularly   Right handed   Social Determinants of Health   Financial Resource Strain: Low Risk    Difficulty of Paying Living Expenses: Not hard at all  Food Insecurity: No Food Insecurity   Worried About Charity fundraiser in the  Last Year: Never true   Ran Out of Food in the Last Year: Never true  Transportation Needs: No Transportation Needs   Lack of Transportation (Medical): No   Lack of Transportation (Non-Medical): No  Physical Activity: Insufficiently Active   Days of Exercise per Week: 4 days   Minutes of Exercise per Session: 10 min  Stress: No Stress Concern Present   Feeling of Stress : Not at all  Social Connections: Moderately Integrated   Frequency of Communication with Friends and Family: More than three times a week   Frequency of Social Gatherings with Friends and Family: Three times a week   Attends Religious Services: More than 4 times per year   Active Member of Clubs or Organizations: Yes   Attends Archivist Meetings: 1 to 4 times per year   Marital Status: Widowed  Human resources officer Violence: Not At Risk   Fear of Current or Ex-Partner: No   Emotionally Abused: No   Physically Abused: No   Sexually Abused: No    Family History  Problem Relation Age of Onset   Hypertension Mother    Stroke Mother    Heart disease Father    Colon cancer Neg Hx    Neurofibromatosis Neg Hx    Allergic rhinitis Neg Hx    Asthma Neg Hx    Angioedema Neg Hx    Atopy Neg Hx    Eczema Neg Hx    Immunodeficiency Neg Hx    Urticaria Neg Hx     ROS: no fevers or chills, productive cough, hemoptysis, dysphasia, odynophagia, melena, hematochezia, dysuria, hematuria, rash, seizure activity, orthopnea, PND, pedal edema, claudication.  Remaining systems are negative.  Physical Exam: Well-developed well-nourished in no acute distress.  Skin is warm and dry.  HEENT is normal.  Neck is supple.  Chest is clear to auscultation with normal expansion.  Cardiovascular exam is regular rate and rhythm.  Abdominal exam nontender or distended. No masses palpated. Extremities show no edema. neuro grossly intact  ECG-sinus rhythm at a rate of 84, first-degree AV block, no significant ST changes.  Personally reviewed  A/P  1 paroxysmal atrial fibrillation-patient remains in sinus rhythm today.  Continue amiodarone and apixaban.  Recent TSH, hemoglobin and liver functions normal.  We will arrange chest x-ray.   2 hypertension-blood pressure controlled.  Continue present medications and follow.   3 prior mitral valve repair-continue SBE prophylaxis. Pt denies dyspnea.   4 coronary artery disease-no CP; continue statin. No ASA given need for apixaban.   5 hyperlipidemia-continue zetia and statin.  Kirk Ruths, MD

## 2021-10-25 ENCOUNTER — Ambulatory Visit (INDEPENDENT_AMBULATORY_CARE_PROVIDER_SITE_OTHER): Payer: Medicare Other | Admitting: Cardiology

## 2021-10-25 ENCOUNTER — Telehealth: Payer: Self-pay

## 2021-10-25 ENCOUNTER — Encounter: Payer: Self-pay | Admitting: Cardiology

## 2021-10-25 ENCOUNTER — Ambulatory Visit
Admission: RE | Admit: 2021-10-25 | Discharge: 2021-10-25 | Disposition: A | Discharge status: Home / Self Care | Payer: Medicare Other | Source: Ambulatory Visit | Attending: Cardiology | Admitting: Cardiology

## 2021-10-25 ENCOUNTER — Other Ambulatory Visit: Payer: Self-pay

## 2021-10-25 VITALS — BP 137/76 | HR 84 | Ht 61.0 in | Wt 114.4 lb

## 2021-10-25 DIAGNOSIS — E7800 Pure hypercholesterolemia, unspecified: Secondary | ICD-10-CM

## 2021-10-25 DIAGNOSIS — I2510 Atherosclerotic heart disease of native coronary artery without angina pectoris: Secondary | ICD-10-CM

## 2021-10-25 DIAGNOSIS — I10 Essential (primary) hypertension: Secondary | ICD-10-CM

## 2021-10-25 DIAGNOSIS — R918 Other nonspecific abnormal finding of lung field: Secondary | ICD-10-CM | POA: Diagnosis not present

## 2021-10-25 DIAGNOSIS — Z9889 Other specified postprocedural states: Secondary | ICD-10-CM | POA: Diagnosis not present

## 2021-10-25 DIAGNOSIS — I480 Paroxysmal atrial fibrillation: Secondary | ICD-10-CM

## 2021-10-25 NOTE — Patient Instructions (Signed)
  Testing/Procedures:  A chest x-ray takes a picture of the organs and structures inside the chest, including the heart, lungs, and blood vessels. This test can show several things, including, whether the heart is enlarges; whether fluid is building up in the lungs; and whether pacemaker / defibrillator leads are still in place. [facility] [place]   Follow-Up: At [facility], you and your health needs are our priority.  As part of our continuing mission to provide you with exceptional heart care, we have created designated Provider Care Teams.  These Care Teams include your primary Cardiologist (physician) and Advanced Practice Providers (APPs -  Physician Assistants and Nurse Practitioners) who all work together to provide you with the care you need, when you need it.  We recommend signing up for the patient portal called "MyChart".  Sign up information is provided on this After Visit Summary.  MyChart is used to connect with patients for Virtual Visits (Telemedicine).  Patients are able to view lab/test results, encounter notes, upcoming appointments, etc.  Non-urgent messages can be sent to your provider as well.   To learn more about what you can do with MyChart, go to [email or web address].    Your next appointment:   12 month(s)  The format for your next appointment:   In Person  Provider:   Kirk Ruths, MD

## 2021-10-25 NOTE — Telephone Encounter (Signed)
I left patient a voicemail to call the office back to schedule her Prolia injection.  - estimated cost is $0 - okay to schedule at any time.

## 2021-10-28 ENCOUNTER — Telehealth: Payer: Self-pay | Admitting: *Deleted

## 2021-10-28 NOTE — Telephone Encounter (Addendum)
-----   Message from Lelon Perla, MD sent at 10/28/2021  7:07 AM EDT ----- Not clear this is amiodarone toxicity but will DC this medication and follow Kirk Ruths  Left message for pt to call

## 2021-10-29 NOTE — Telephone Encounter (Signed)
Scheduled for 11/11/21.

## 2021-10-31 NOTE — Telephone Encounter (Signed)
Spoke with pt daughter, several messages have been left for the patient to call. Patient daughter will make sure she stops the amiodarone.

## 2021-11-01 ENCOUNTER — Telehealth: Payer: Self-pay | Admitting: Cardiology

## 2021-11-01 NOTE — Telephone Encounter (Signed)
Pt returning phone call... please advise  

## 2021-11-01 NOTE — Telephone Encounter (Signed)
Pt is returning call from 10/28/21 to [facility]

## 2021-11-01 NOTE — Telephone Encounter (Signed)
Left message for patient to return the call.

## 2021-11-01 NOTE — Telephone Encounter (Signed)
Patient denies any symptoms of infection  She did stop Amiodarone but concerned and wants to know if she needs follow up visit/chest xray   Will forward to Dr Stanford Breed for review

## 2021-11-04 ENCOUNTER — Other Ambulatory Visit: Payer: Self-pay

## 2021-11-04 ENCOUNTER — Encounter (HOSPITAL_BASED_OUTPATIENT_CLINIC_OR_DEPARTMENT_OTHER): Payer: Self-pay

## 2021-11-04 ENCOUNTER — Emergency Department (HOSPITAL_BASED_OUTPATIENT_CLINIC_OR_DEPARTMENT_OTHER): Payer: Medicare Other

## 2021-11-04 ENCOUNTER — Emergency Department (HOSPITAL_BASED_OUTPATIENT_CLINIC_OR_DEPARTMENT_OTHER)
Admission: EM | Admit: 2021-11-04 | Discharge: 2021-11-04 | Disposition: A | Discharge status: Home / Self Care | Payer: Medicare Other | Attending: Emergency Medicine | Admitting: Emergency Medicine

## 2021-11-04 DIAGNOSIS — S0083XA Contusion of other part of head, initial encounter: Secondary | ICD-10-CM | POA: Diagnosis not present

## 2021-11-04 DIAGNOSIS — S0990XA Unspecified injury of head, initial encounter: Secondary | ICD-10-CM | POA: Diagnosis not present

## 2021-11-04 DIAGNOSIS — I110 Hypertensive heart disease with heart failure: Secondary | ICD-10-CM | POA: Insufficient documentation

## 2021-11-04 DIAGNOSIS — Z20822 Contact with and (suspected) exposure to covid-19: Secondary | ICD-10-CM | POA: Diagnosis not present

## 2021-11-04 DIAGNOSIS — W108XXA Fall (on) (from) other stairs and steps, initial encounter: Secondary | ICD-10-CM | POA: Diagnosis not present

## 2021-11-04 DIAGNOSIS — Z79899 Other long term (current) drug therapy: Secondary | ICD-10-CM | POA: Insufficient documentation

## 2021-11-04 DIAGNOSIS — I2510 Atherosclerotic heart disease of native coronary artery without angina pectoris: Secondary | ICD-10-CM | POA: Insufficient documentation

## 2021-11-04 DIAGNOSIS — I5032 Chronic diastolic (congestive) heart failure: Secondary | ICD-10-CM | POA: Insufficient documentation

## 2021-11-04 DIAGNOSIS — Y92009 Unspecified place in unspecified non-institutional (private) residence as the place of occurrence of the external cause: Secondary | ICD-10-CM | POA: Diagnosis not present

## 2021-11-04 DIAGNOSIS — I517 Cardiomegaly: Secondary | ICD-10-CM | POA: Diagnosis not present

## 2021-11-04 DIAGNOSIS — E039 Hypothyroidism, unspecified: Secondary | ICD-10-CM | POA: Diagnosis not present

## 2021-11-04 DIAGNOSIS — Z87891 Personal history of nicotine dependence: Secondary | ICD-10-CM | POA: Diagnosis not present

## 2021-11-04 DIAGNOSIS — W19XXXA Unspecified fall, initial encounter: Secondary | ICD-10-CM

## 2021-11-04 DIAGNOSIS — Z043 Encounter for examination and observation following other accident: Secondary | ICD-10-CM | POA: Diagnosis not present

## 2021-11-04 MED ORDER — METHOCARBAMOL 500 MG PO TABS: 500.0000 mg | ORAL_TABLET | Freq: Two times a day (BID) | ORAL | 0 refills | Status: AC | PRN

## 2021-11-04 NOTE — ED Triage Notes (Signed)
Patient here POV from Home after Fall.  Patient states she fell on Friday after she was ambulating without her Cane. Patient fell onto Chest outside into [place].  Patient did not get assessed this Weekend but came today after Family and PCP recommended ED Evaluation.Patient does take Eliquis for A. Fib.  NAD Noted during Triage. A&Ox4. GCS 15. Ambulatory with Cane. Soreness to Chest and Back.

## 2021-11-04 NOTE — ED Provider Notes (Signed)
[facility] DEPT Provider Note   CSN: [id number] Arrival date & time: 11/04/21  1620     History Chief Complaint  Patient presents with   Lytle Michaels    Amy Fischer is a 87 y.o. female.  This is a 87 y.o. female with significant medical history as below, including CHF, hypertension, afib on eliquis who presents to the ED with complaint of fall. Fall was 2 days ago, pt tripped down a couple stairs at home. She has been using cane but is supposed to be using her walker but she does not like using it because she is unable to carry things while she is ambulating.  Patient fell to the ground, tripped at the edge of a step.  Hit the left side of her head.  No LOC, she is on Eliquis.  Good compliance.  No headache, no neck pain, no numbness or tingling.  He also feels that she hit her chest on the ground.  No difficulty with breathing.  No ongoing chest pain.  She has some arthralgias but no significant ongoing pain.  She was instructed to come to the hospital by her daughter when she heard the patient had a fall.  Overall patient feels that she is at her baseline other than some arthralgias.  Acting normally per family bedside    The history is provided by the patient. No language interpreter was used.  Fall Pertinent negatives include no chest pain, no abdominal pain, no headaches and no shortness of breath.      Past Medical History:  Diagnosis Date   Allergy    Anemia    Asymptomatic carotid artery stenosis    R ICA 40% stenosis on CTA 12/2011.   Bladder polyps    Cataract    BILATERAL-REMOVED   CHF (congestive heart failure) (HCC)    Diverticulosis    Elevated blood pressure 03/25/2011   Bp readings borderline today has hx of elvation  In office and ok at home   Not checke recently   She gfeels was elevated from anxiety     Episodic recurrent vertigo    MRI Head 2003   Esophageal spasm    GERD (gastroesophageal reflux disease)    Hepatic hemangioma     History of hepatitis    unknown type   Hyperlipidemia    Hyperplastic colon polyp    Hypothyroidism    Intestinal metaplasia of gastric mucosa    Osteoarthritis    Osteoporosis    Patellar fracture 07/13/2012   Scapular fracture 03/31/2012   small from direct blow with trip     Subclavian steal syndrome    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; CTA 2013 revealed open graft    Patient Active Problem List   Diagnosis Date Noted   CAD (coronary artery disease) 02/22/2016   Unsteadiness 11/20/2015   PAF (paroxysmal atrial fibrillation) ([place]) 11/06/2015   Chronic diastolic CHF (congestive heart failure), NYHA class 2 ([place]) 11/06/2015   S/P MVR (mitral valve repair) 10/10/2015   Severe mitral regurgitation    Shortness of breath    Acute congestive heart failure ([place])    Left hip pain    Mitral regurgitation 10/02/2015   Paroxysmal atrial fibrillation ([place]) 10/01/2015   Acute respiratory failure with hypoxia and hypercapnia ([place])    Respiratory distress 09/30/2015   Acute on chronic diastolic congestive heart failure, NYHA class 3 ([place]) 09/30/2015   Hx of fracture 04/26/2015   Diplopia 04/26/2015   Medicare  annual wellness visit, subsequent 07/26/2014   Diarrhea 03/30/2014   Rhinitis 10/06/2013   Hx of vomiting 05/26/2013   Carotid artery disease without cerebral infarction ([place]) 03/22/2013   Hypertension, isolated systolic 62/69/4854   Bereavement 03/22/2013   Fracture, finger 07/13/2012   Neuritis 04/10/2012   Constipation, other cause 03/31/2012   Hx of fall 03/31/2012   Esophageal disorder 11/27/2011   Atypical chest pain 11/27/2011   Dizziness 11/27/2011   Elevated blood pressure 03/25/2011   Subclavian steal syndrome 09/24/2010   GAIT IMBALANCE 03/19/2010   SKIN LESION, UNCERTAIN SIGNIFICANCE 09/18/2009   SLEEPLESSNESS 03/20/2009   HYPERGLYCEMIA 03/20/2009   VITAMIN D DEFICIENCY 11/09/2007   ALLERGIC RHINITIS 11/09/2007   LEG PAIN, BILATERAL 11/09/2007    Hypothyroidism 08/19/2007   HYPERLIPIDEMIA 08/19/2007   GERD 08/19/2007   OSTEOARTHRITIS 08/19/2007   Osteoporosis 08/19/2007    Past Surgical History:  Procedure Laterality Date   APPENDECTOMY     CARDIAC CATHETERIZATION N/A 10/04/2015   Procedure: Right/Left Heart Cath and Coronary Angiography;  Surgeon: Belva Crome, MD;  Location: [facility];  Service: Cardiovascular;  Laterality: N/A;   CAROTID-SUBCLAVIAN BYPASS GRAFT Left 1999   for [place] steal syndrome   CHOLECYSTECTOMY     COLONOSCOPY     CYSTECTOMY Left    hand   ELBOW SURGERY Left    KNEE SURGERY Left 2014   MITRAL VALVE REPAIR N/A 10/10/2015   Procedure: MITRAL VALVE REPAIR (MVR) WITH SIZE 28 CARPENTIER-EDWARDS PHYSIO II ANNULOPLASTY RING;  Surgeon: [name], MD;  Location: [facility];  Service: Open Heart Surgery;  Laterality: N/A;   ROTATOR CUFF REPAIR Left    TEAR DUCT PROBING  07/2014   TEE WITHOUT CARDIOVERSION N/A 10/03/2015   Procedure: TRANSESOPHAGEAL ECHOCARDIOGRAM (TEE);  Surgeon: Larey Dresser, MD;  Location: [facility];  Service: Cardiovascular;  Laterality: N/A;   TEE WITHOUT CARDIOVERSION N/A 10/10/2015   Procedure: TRANSESOPHAGEAL ECHOCARDIOGRAM (TEE);  Surgeon: [name], MD;  Location: [facility];  Service: Open Heart Surgery;  Laterality: N/A;   TONSILLECTOMY     TUBAL LIGATION       OB History   No obstetric history on file.     Family History  Problem Relation Age of Onset   Hypertension Mother    Stroke Mother    Heart disease Father    Colon cancer Neg Hx    Neurofibromatosis Neg Hx    Allergic rhinitis Neg Hx    Asthma Neg Hx    Angioedema Neg Hx    Atopy Neg Hx    Eczema Neg Hx    Immunodeficiency Neg Hx    Urticaria Neg Hx     Social History   Tobacco Use   Smoking status: Former    Packs/day: 0.75    Years: 20.00    Pack years: 15.00    Types: Cigarettes    Quit date: 01/05/1991    Years since quitting: 30.8   Smokeless tobacco: Never  Vaping Use    Vaping Use: Never used  Substance Use Topics   Alcohol use: Yes    Alcohol/week: 4.0 standard drinks    Types: 4 Glasses of wine per week    Comment: socially   Drug use: No    Home Medications Prior to Admission medications   Medication Sig Start Date End Date Taking? Authorizing Provider  methocarbamol (ROBAXIN) 500 MG tablet Take 1 tablet (500 mg total) by mouth 2 (two) times daily as needed for up to  5 days for muscle spasms. 11/04/21 11/09/21 Yes Wynona Dove A, DO  albuterol (VENTOLIN HFA) 108 (90 Base) MCG/ACT inhaler Inhale 2 puffs into the lungs every 6 (six) hours as needed for wheezing or shortness of breath. 07/08/21   Kozlow, Donnamarie Poag, MD  cholecalciferol (VITAMIN D) 1000 units tablet Take 1,000 Units by mouth daily.    [provider]  DiphenhydrAMINE HCl (BENADRYL PO) Take by mouth.    [provider]  ELIQUIS 2.5 MG TABS tablet TAKE 1 TABLET TWICE A DAY 05/14/21   Lelon Perla, MD  ezetimibe (ZETIA) 10 MG tablet TAKE 1 TABLET DAILY 02/15/21   Panosh, [name], MD  folic acid (FOLVITE) 1 MG tablet TAKE 1 TABLET DAILY 08/22/21   Panosh, [name], MD  glucosamine-chondroitin 500-400 MG tablet Take 1 tablet by mouth daily.    [provider]  ipratropium (ATROVENT) 0.06 % nasal spray Place 1 spray into both nostrils 2 (two) times daily as needed for rhinitis. 12/24/20   Panosh, [name], MD  levothyroxine (SYNTHROID) 100 MCG tablet TAKE 1 TABLET EVERY MORNING ON AN EMPTY STOMACH 08/28/21   Panosh, [name], MD  Melatonin 3 MG TABS Take 3 mg by mouth at bedtime.     [provider]  MULTIPLE VITAMIN PO Take 1 tablet by mouth daily. 50+ Senior Vitamin Daily    [provider]  omeprazole (PRILOSEC) 40 MG capsule TAKE 1 CAPSULE DAILY 02/08/15   Panosh, [name], MD  ondansetron (ZOFRAN) 4 MG tablet Take 1 tablet (4 mg total) by mouth every 6 (six) hours. 11/24/20   Tedd Sias, PA  Polyethyl Glycol-Propyl Glycol (SYSTANE OP) Place 1 drop  into both eyes 2 (two) times daily. Use 1-2 Drops in both eyes    [provider]  rosuvastatin (CRESTOR) 5 MG tablet TAKE ONE-HALF (1/2) TABLET DAILY 07/15/21   Panosh, [name], MD  SALINE NASAL SPRAY NA Place 1 spray into both nostrils 2 (two) times daily as needed (congestion).     [provider]  triamcinolone (NASACORT) 55 MCG/ACT AERO nasal inhaler Place 1 spray into the nose daily.    [provider]  vitamin B-12 (CYANOCOBALAMIN) 1000 MCG tablet Take 1,000 mcg by mouth daily.    [provider]    Allergies    Codeine, Risedronate sodium, Statins, Tape, and Amoxicillin-pot clavulanate  Review of Systems   Review of Systems  Constitutional:  Negative for chills and fever.  HENT:  Negative for facial swelling and trouble swallowing.   Eyes:  Negative for photophobia and visual disturbance.  Respiratory:  Negative for cough and shortness of breath.   Cardiovascular:  Negative for chest pain and palpitations.  Gastrointestinal:  Negative for abdominal pain, nausea and vomiting.  Endocrine: Negative for polydipsia and polyuria.  Genitourinary:  Negative for difficulty urinating and hematuria.  Musculoskeletal:  Positive for arthralgias. Negative for gait problem and joint swelling.  Skin:  Negative for pallor and rash.  Neurological:  Negative for syncope and headaches.  Psychiatric/Behavioral:  Negative for agitation and confusion.    Physical Exam Updated Vital Signs BP (!) 184/84   Pulse 86   Temp 97.9 F (36.6 C)   Resp 18   Ht 5\' 1"  (1.549 m)   Wt 51.9 kg   SpO2 94%   BMI 21.62 kg/m   Physical Exam Vitals and nursing note reviewed.  Constitutional:      General: She is not in acute distress.  Appearance: Normal appearance.  HENT:     Head: Normocephalic.     Comments: Left-sided frontal hematoma    Right Ear: External ear normal.     Left Ear: External ear normal.     Nose: Nose normal.     Mouth/Throat:     Mouth: Mucous  membranes are moist.  Eyes:     General: No scleral icterus.       Right eye: No discharge.        Left eye: No discharge.     Extraocular Movements: Extraocular movements intact.     Pupils: Pupils are equal, round, and reactive to light.  Cardiovascular:     Rate and Rhythm: Normal rate and regular rhythm.     Pulses: Normal pulses.     Heart sounds: Normal heart sounds.  Pulmonary:     Effort: Pulmonary effort is normal. No respiratory distress.     Breath sounds: Normal breath sounds.  Abdominal:     General: Abdomen is flat.     Tenderness: There is no abdominal tenderness.  Musculoskeletal:        General: Normal range of motion.     Cervical back: Normal range of motion.     Right lower leg: No edema.     Left lower leg: No edema.  Skin:    General: Skin is warm and dry.     Capillary Refill: Capillary refill takes less than 2 seconds.  Neurological:     Mental Status: She is alert and oriented to person, place, and time.     GCS: GCS eye subscore is 4. GCS verbal subscore is 5. GCS motor subscore is 6.     Cranial Nerves: Cranial nerves 2-12 are intact. No dysarthria or facial asymmetry.     Sensory: Sensation is intact.     Motor: Motor function is intact. No seizure activity.     Coordination: Coordination is intact.  Psychiatric:        Mood and Affect: Mood normal.        Behavior: Behavior normal.    ED Results / Procedures / Treatments   Labs (all labs ordered are listed, but only abnormal results are displayed) Labs Reviewed - No data to display  EKG None  Radiology CT Head Wo Contrast  Result Date: 11/04/2021 CLINICAL DATA:  Fall EXAM: CT HEAD WITHOUT CONTRAST CT CERVICAL SPINE WITHOUT CONTRAST TECHNIQUE: Multidetector CT imaging of the head and cervical spine was performed following the standard protocol without intravenous contrast. Multiplanar CT image reconstructions of the cervical spine were also generated. COMPARISON:  01/13/2016 FINDINGS: CT  HEAD FINDINGS Brain: No evidence of acute infarction, hemorrhage, hydrocephalus, extra-axial collection or mass lesion/mass effect. Vascular: Mild intracranial atherosclerosis. Skull: Normal. Negative for fracture or focal lesion. Sinuses/Orbits: The visualized paranasal sinuses are essentially clear. The mastoid air cells are unopacified. Other: None. CT CERVICAL SPINE FINDINGS Alignment: Normal cervical lordosis. Skull base and vertebrae: No acute fracture. No primary bone lesion or focal pathologic process. Soft tissues and spinal canal: No prevertebral fluid or swelling. No visible canal hematoma. Disc levels: Mild degenerative changes of the mid cervical spine. Spinal canal is patent. Upper chest: Visualized lung apices are notable for mild biapical pleural-parenchymal scarring. Other: Visualized thyroid is unremarkable. IMPRESSION: No evidence of acute intracranial abnormality. No evidence of traumatic injury to the cervical spine. Mild degenerative changes. Electronically Signed   By: Julian Hy M.D.   On: 11/04/2021 18:20   CT Cervical Spine Wo  Contrast  Result Date: 11/04/2021 CLINICAL DATA:  Fall EXAM: CT HEAD WITHOUT CONTRAST CT CERVICAL SPINE WITHOUT CONTRAST TECHNIQUE: Multidetector CT imaging of the head and cervical spine was performed following the standard protocol without intravenous contrast. Multiplanar CT image reconstructions of the cervical spine were also generated. COMPARISON:  01/13/2016 FINDINGS: CT HEAD FINDINGS Brain: No evidence of acute infarction, hemorrhage, hydrocephalus, extra-axial collection or mass lesion/mass effect. Vascular: Mild intracranial atherosclerosis. Skull: Normal. Negative for fracture or focal lesion. Sinuses/Orbits: The visualized paranasal sinuses are essentially clear. The mastoid air cells are unopacified. Other: None. CT CERVICAL SPINE FINDINGS Alignment: Normal cervical lordosis. Skull base and vertebrae: No acute fracture. No primary bone lesion  or focal pathologic process. Soft tissues and spinal canal: No prevertebral fluid or swelling. No visible canal hematoma. Disc levels: Mild degenerative changes of the mid cervical spine. Spinal canal is patent. Upper chest: Visualized lung apices are notable for mild biapical pleural-parenchymal scarring. Other: Visualized thyroid is unremarkable. IMPRESSION: No evidence of acute intracranial abnormality. No evidence of traumatic injury to the cervical spine. Mild degenerative changes. Electronically Signed   By: Julian Hy M.D.   On: 11/04/2021 18:20   DG Chest Portable 1 View  Result Date: 11/04/2021 CLINICAL DATA:  Fall EXAM: PORTABLE CHEST 1 VIEW COMPARISON:  10/25/2021 FINDINGS: Mild bibasilar scarring. No focal consolidation. No pleural effusion or pneumothorax. Mild cardiomegaly. Prosthetic valve. Atherosclerotic calcifications of the aortic arch. Median sternotomy. IMPRESSION: No evidence of acute cardiopulmonary disease. Electronically Signed   By: Julian Hy M.D.   On: 11/04/2021 21:48    Procedures Procedures   Medications Ordered in ED Medications - No data to display  ED Course  I have reviewed the triage vital signs and the nursing notes.  Pertinent labs & imaging results that were available during my care of the patient were reviewed by me and considered in my medical decision making (see chart for details).    MDM Rules/Calculators/A&P                           CC: Fall  This patient complains of above; this involves an extensive number of treatment options and is a complaint that carries with it a high risk of complications and morbidity. Vital signs were reviewed. Serious etiologies considered.  Record review:  Previous records obtained and reviewed   Additional history obtained from daughter  Work up as above, notable for:  Imaging results that were available during my care of the patient were reviewed by me and considered in my medical decision  making.   I ordered imaging studies which included CT head, CT cervical spine, chest x-ray and I independently visualized and interpreted imaging which showed no acute process  Reassessment:  Patient reports that she is feeling typical of her baseline.  No focal neurologic deficits.  She is ambulatory.  Discussed concussion precautions with the patient.  Discussed negative imaging findings with the patient.  Recommend patient utilize walker at home instead of cane.   Patient presents with low mechanism head trauma. On initial evaluation patient appears in no acute distress, afebrile with normal vital signs. Patient has completely intact neurovascular exam, and pain improved in ED.  CT imaging pursued as head injury on DOAC and found to be without acute pathology.    Discussed possible etiology of concussion, signs and symptoms, and discharge instructions. Detailed discussions were had with the patient regarding current findings, and need for close f/u with  PCP or on call doctor. The patient has been instructed to return immediately if the symptoms worsen in any way for re-evaluation. Patient verbalized understanding and is in agreement with current care plan. All questions answered prior to discharge          This chart was dictated using voice recognition software.  Despite best efforts to proofread,  errors can occur which can change the documentation meaning.  Final Clinical Impression(s) / ED Diagnoses Final diagnoses:  Closed head injury, initial encounter  Fall, initial encounter    Rx / DC Orders ED Discharge Orders          Ordered    methocarbamol (ROBAXIN) 500 MG tablet  2 times daily PRN        11/04/21 2124             Jeanell Sparrow, DO 11/04/21 2203

## 2021-11-04 NOTE — Telephone Encounter (Signed)
Spoke with pt, Aware of dr Jacalyn Lefevre recommendations. Patient reports that she fell on Friday and hit her head. She was encouraged to call her medical doctor to have a CT scan done. Pt agreed with this plan.

## 2021-11-04 NOTE — Discharge Instructions (Signed)
Based on the events which brought you to the ER today, it is possible that you may have a concussion. A concussion occurs when there is a blow to the head or body, with enough force to shake the brain and disrupt how the brain functions. You may experience symptoms such as headaches, sensitivity to light/noise, dizziness, cognitive slowing, difficulty concentrating / remembering, trouble sleeping and drowsiness. These symptoms may last anywhere from hours/days to potentially weeks/months. While these symptoms are very frustrating and perhaps debilitating, it is important that you remember that they will improve over time. Everyone has a different rate of recovery; it is difficult to predict when your symptoms will resolve. In order to allow for your brain to heal after the injury, we recommend that you see your primary physician or a physician knowledgeable in concussion management. We also advise you to let your body and brain rest: avoid physical activities (sports, gym, and exercise) and reduce cognitive demands (reading, texting, TV watching, computer use, video games, etc). School attendance, after-school activities and work may need to be modified to avoid increasing symptoms. We recommend against driving until until all symptoms have resolved. You should take 650mg  of Acetaminophen (Tylenol) every 4 hours as needed for pain control; however, taking anti-inflammatory medication (Motrin/Advil/Ibuprofen) is not advised. Come back to the ER right away if you are having repeated episodes of vomiting, severe/worsening headache/dizziness or any other symptom that alarms you. We recommended that someone stay with you for the next 24 hours to monitor for these worrisome symptoms.

## 2021-11-05 ENCOUNTER — Telehealth: Payer: Self-pay

## 2021-11-05 NOTE — Telephone Encounter (Signed)
I spoke with the pt in regards to recent fall she had on 11/04/2021. Pt reported that she is feeling ok at this time and has no headaches or pain. Pt was seen at ED located at [facility] and a CAT scan was done on the pt.

## 2021-11-11 ENCOUNTER — Ambulatory Visit (INDEPENDENT_AMBULATORY_CARE_PROVIDER_SITE_OTHER): Payer: Medicare Other | Admitting: Internal Medicine

## 2021-11-11 ENCOUNTER — Ambulatory Visit: Payer: Medicare Other

## 2021-11-11 ENCOUNTER — Encounter: Payer: Self-pay | Admitting: Internal Medicine

## 2021-11-11 ENCOUNTER — Other Ambulatory Visit: Payer: Self-pay | Admitting: Cardiology

## 2021-11-11 VITALS — BP 90/70 | HR 87 | Temp 97.5°F | Ht 61.0 in | Wt 110.0 lb

## 2021-11-11 DIAGNOSIS — I2510 Atherosclerotic heart disease of native coronary artery without angina pectoris: Secondary | ICD-10-CM

## 2021-11-11 DIAGNOSIS — E538 Deficiency of other specified B group vitamins: Secondary | ICD-10-CM | POA: Diagnosis not present

## 2021-11-11 DIAGNOSIS — Z79899 Other long term (current) drug therapy: Secondary | ICD-10-CM | POA: Diagnosis not present

## 2021-11-11 DIAGNOSIS — R42 Dizziness and giddiness: Secondary | ICD-10-CM

## 2021-11-11 DIAGNOSIS — Z9181 History of falling: Secondary | ICD-10-CM | POA: Diagnosis not present

## 2021-11-11 DIAGNOSIS — R0600 Dyspnea, unspecified: Secondary | ICD-10-CM

## 2021-11-11 DIAGNOSIS — M810 Age-related osteoporosis without current pathological fracture: Secondary | ICD-10-CM | POA: Diagnosis not present

## 2021-11-11 DIAGNOSIS — Z7901 Long term (current) use of anticoagulants: Secondary | ICD-10-CM

## 2021-11-11 LAB — CBC WITH DIFFERENTIAL/PLATELET
Basophils Absolute: 0 10*3/uL (ref 0.0–0.1)
Basophils Relative: 0.7 % (ref 0.0–3.0)
Eosinophils Absolute: 0.1 10*3/uL (ref 0.0–0.7)
Eosinophils Relative: 1.7 % (ref 0.0–5.0)
HCT: 41.9 % (ref 36.0–46.0)
Hemoglobin: 13.9 g/dL (ref 12.0–15.0)
Lymphocytes Relative: 16.5 % (ref 12.0–46.0)
Lymphs Abs: 1.1 10*3/uL (ref 0.7–4.0)
MCHC: 33.2 g/dL (ref 30.0–36.0)
MCV: 96.5 fl (ref 78.0–100.0)
Monocytes Absolute: 0.7 10*3/uL (ref 0.1–1.0)
Monocytes Relative: 9.5 % (ref 3.0–12.0)
Neutro Abs: 4.9 10*3/uL (ref 1.4–7.7)
Neutrophils Relative %: 71.6 % (ref 43.0–77.0)
Platelets: 259 10*3/uL (ref 150.0–400.0)
RBC: 4.34 Mil/uL (ref 3.87–5.11)
RDW: 14 % (ref 11.5–15.5)
WBC: 6.9 10*3/uL (ref 4.0–10.5)

## 2021-11-11 LAB — BASIC METABOLIC PANEL
BUN: 19 mg/dL (ref 6–23)
CO2: 27 mEq/L (ref 19–32)
Calcium: 10.4 mg/dL (ref 8.4–10.5)
Chloride: 105 mEq/L (ref 96–112)
Creatinine, Ser: 0.84 mg/dL (ref 0.40–1.20)
GFR: 61.09 mL/min (ref >60.00)
Glucose, Bld: 80 mg/dL (ref 70–99)
Potassium: 4.5 mEq/L (ref 3.5–5.1)
Sodium: 140 mEq/L (ref 135–145)

## 2021-11-11 LAB — VITAMIN B12: Vitamin B-12: 981 pg/mL — ABNORMAL HIGH (ref 211–911)

## 2021-11-11 LAB — VITAMIN D 25 HYDROXY (VIT D DEFICIENCY, FRACTURES): VITD: 35.58 ng/mL (ref 30.00–100.00)

## 2021-11-11 LAB — TSH: TSH: 4.1 u[IU]/mL (ref 0.35–5.50)

## 2021-11-11 MED ORDER — DENOSUMAB 60 MG/ML ~~LOC~~ SOSY
60.0000 mg | PREFILLED_SYRINGE | Freq: Once | SUBCUTANEOUS | Status: DC
Start: 2021-11-11 — End: 2021-11-11

## 2021-11-11 NOTE — Patient Instructions (Addendum)
You probabaly have a mild concussion  but checking for anemia  and other causes.  Your lungs are clear today and good O2 level .   Stay hydrated .    Use  your cane .   Plan get appt for prolia for next week .   Will share infor with Dr Stanford Breed.   Wt Readings from Last 3 Encounters:  11/11/21 110 lb (49.9 kg)  11/04/21 114 lb 6.7 oz (51.9 kg)  10/25/21 114 lb 6.4 oz (51.9 kg)

## 2021-11-11 NOTE — Progress Notes (Signed)
Pt came in for Prolia injection however pt was not feeling well. Pt c/o dizziness and aches. Verbally spoke with Dr.Panosh about pr sx and she advised to do vitals. Pt placed in exam room to see Dr.Panosh for low BP. This nv will be cancel. Pt will be added to Dr. Suzzette Righter schedule.

## 2021-11-11 NOTE — Progress Notes (Signed)
Chief Complaint  Patient presents with   Dizziness    HPI: Amy Fischer 87 y.o. come in for sda work in appointment patient came in for her Prolia injection but was complaining of dizziness and not feeling well.  So her visit was converted to an add on work in appointment.  She had a fall November 4when she was outside in the yard without her cane fell forward hits the left side of her head and her left chest wall no loss of consciousness and no pain at the beginning but she did have her "[facility]" call some 1 for help was seen in the emergency room November 7  Got up from hands and knees  and crawled  up with post.   Later   left side hurt  and hit head.  She eventually went to the emergency room had negative head neck CT and one-view portable chest x-ray.  She has had some lightheadedness dizziness has not had much of an appetite in the last week has lost some weight. Has not had a fracture since she has been on Prolia.  Does take vitamin D. She is still driving when needed. Feels that she is short of breath but not sure it is any worse does hurt to take a deep breath on the left side.  No fever and cough.  No unusual bleeding or bruising she is on Eliquis.  A few weeks ago her amiodarone was stopped by Dr. Stanford Breed because a chest x-ray was abnormal does not have a follow-up.  No specific persistent rapid heart rate although may get some once in a while. Blood pressure this morning was originally 159 and on repeat in the 130s.  ROS: See pertinent positives and negatives per HPI.  Past Medical History:  Diagnosis Date   Allergy    Anemia    Asymptomatic carotid artery stenosis    R ICA 40% stenosis on CTA 12/2011.   Bladder polyps    Cataract    BILATERAL-REMOVED   CHF (congestive heart failure) (HCC)    Diverticulosis    Elevated blood pressure 03/25/2011   Bp readings borderline today has hx of elvation  In office and ok at home   Not checke recently   She gfeels was  elevated from anxiety     Episodic recurrent vertigo    MRI Head 2003   Esophageal spasm    GERD (gastroesophageal reflux disease)    Hepatic hemangioma    History of hepatitis    unknown type   Hyperlipidemia    Hyperplastic colon polyp    Hypothyroidism    Intestinal metaplasia of gastric mucosa    Osteoarthritis    Osteoporosis    Patellar fracture 07/13/2012   Scapular fracture 03/31/2012   small from direct blow with trip     Subclavian steal syndrome    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; CTA 2013 revealed open graft    Family History  Problem Relation Age of Onset   Hypertension Mother    Stroke Mother    Heart disease Father    Colon cancer Neg Hx    Neurofibromatosis Neg Hx    Allergic rhinitis Neg Hx    Asthma Neg Hx    Angioedema Neg Hx    Atopy Neg Hx    Eczema Neg Hx    Immunodeficiency Neg Hx    Urticaria Neg Hx     Social History   Socioeconomic History   Marital  status: Widowed    Spouse name: Not on file   Number of children: 4   Years of education: Not on file   Highest education level: Bachelor's degree (e.g., BA, AB, BS)  Occupational History   Occupation: retired Pharmacist, hospital  Tobacco Use   Smoking status: Former    Packs/day: 0.75    Years: 20.00    Pack years: 15.00    Types: Cigarettes    Quit date: 01/05/1991    Years since quitting: 30.8   Smokeless tobacco: Never  Vaping Use   Vaping Use: Never used  Substance and Sexual Activity   Alcohol use: Yes    Alcohol/week: 4.0 standard drinks    Types: 4 Glasses of wine per week    Comment: socially   Drug use: No   Sexual activity: Not on file  Other Topics Concern   Not on file  Social History Narrative   Widowed.  Lives alone in a one story home.  Has 4 children (3 living).  Retired first [employer]    HH of 1    No pets   Former smoker   Exercises regularly   Right handed   Social Determinants of Radio broadcast assistant Strain: Low Risk    Difficulty of Paying  Living Expenses: Not hard at all  Food Insecurity: No Food Insecurity   Worried About Charity fundraiser in the Last Year: Never true   Arboriculturist in the Last Year: Never true  Transportation Needs: No Transportation Needs   Lack of Transportation (Medical): No   Lack of Transportation (Non-Medical): No  Physical Activity: Insufficiently Active   Days of Exercise per Week: 4 days   Minutes of Exercise per Session: 10 min  Stress: No Stress Concern Present   Feeling of Stress : Not at all  Social Connections: Moderately Integrated   Frequency of Communication with Friends and Family: More than three times a week   Frequency of Social Gatherings with Friends and Family: Three times a week   Attends Religious Services: More than 4 times per year   Active Member of Clubs or Organizations: Yes   Attends Archivist Meetings: 1 to 4 times per year   Marital Status: Widowed    Outpatient Medications Prior to Visit  Medication Sig Dispense Refill   albuterol (VENTOLIN HFA) 108 (90 Base) MCG/ACT inhaler Inhale 2 puffs into the lungs every 6 (six) hours as needed for wheezing or shortness of breath. 18 g 1   cholecalciferol (VITAMIN D) 1000 units tablet Take 1,000 Units by mouth daily.     DiphenhydrAMINE HCl (BENADRYL PO) Take by mouth.     ELIQUIS 2.5 MG TABS tablet TAKE 1 TABLET TWICE A DAY 180 tablet 1   ezetimibe (ZETIA) 10 MG tablet TAKE 1 TABLET DAILY 90 tablet 3   folic acid (FOLVITE) 1 MG tablet TAKE 1 TABLET DAILY 90 tablet 3   glucosamine-chondroitin 500-400 MG tablet Take 1 tablet by mouth daily.     ipratropium (ATROVENT) 0.06 % nasal spray Place 1 spray into both nostrils 2 (two) times daily as needed for rhinitis. 45 mL 5   levothyroxine (SYNTHROID) 100 MCG tablet TAKE 1 TABLET EVERY MORNING ON AN EMPTY STOMACH 90 tablet 3   Melatonin 3 MG TABS Take 3 mg by mouth at bedtime.      MULTIPLE VITAMIN PO Take 1 tablet by mouth daily. 50+ Senior Vitamin Daily      omeprazole (  PRILOSEC) 40 MG capsule TAKE 1 CAPSULE DAILY 90 capsule 1   ondansetron (ZOFRAN) 4 MG tablet Take 1 tablet (4 mg total) by mouth every 6 (six) hours. 12 tablet 0   Polyethyl Glycol-Propyl Glycol (SYSTANE OP) Place 1 drop into both eyes 2 (two) times daily. Use 1-2 Drops in both eyes     rosuvastatin (CRESTOR) 5 MG tablet TAKE ONE-HALF (1/2) TABLET DAILY 90 tablet 1   SALINE NASAL SPRAY NA Place 1 spray into both nostrils 2 (two) times daily as needed (congestion).      triamcinolone (NASACORT) 55 MCG/ACT AERO nasal inhaler Place 1 spray into the nose daily.     vitamin B-12 (CYANOCOBALAMIN) 1000 MCG tablet Take 1,000 mcg by mouth daily.     No facility-administered medications prior to visit.     EXAM:  BP 90/70   Pulse 87   Temp (!) 97.5 F (36.4 C) (Oral)   Ht 5\' 1"  (1.549 m)   Wt 110 lb (49.9 kg)   SpO2 95%   BMI 20.78 kg/m   Body mass index is 20.78 kg/m.  GENERAL: vitals reviewed and listed above, alert, oriented, appears well hydrated and in no acute distress walks with a cane slowly arising from chair HEENT: atraumatic, conjunctiva  clear, no obvious abnormalities on inspection of external nose and ears EOMs are full no hematoma noted OP : Masked NECK: no obvious masses on inspection palpation  LUNGS: clear to auscultation bilaterally, no wheezes, rales or rhonchi, good air movement soreness on the left side of the rib cage but no step-off bruising point tenderness CV: HRRR, pulses 80 no clubbing cyanosis or  peripheral edema nl cap refill orthostatic blood pressure 150/70 sitting standing 139/70 heart rate 86. MS: moves all extremities osteoarthritic changes no acute findings PSYCH: pleasant and cooperative, no obvious depression or anxiety peers cognitively intact. Neurologic appears nonfocal independent with cane although slow Lab Results  Component Value Date   WBC 6.3 08/26/2021   HGB 14.0 08/26/2021   HCT 42.3 08/26/2021   PLT 155.0 08/26/2021    GLUCOSE 80 08/26/2021   CHOL 158 01/28/2021   TRIG 100.0 01/28/2021   HDL 65.50 01/28/2021   LDLDIRECT 76.0 08/19/2019   LDLCALC 73 01/28/2021   ALT 14 08/26/2021   AST 18 08/26/2021   NA 141 08/26/2021   K 4.5 08/26/2021   CL 106 08/26/2021   CREATININE 0.85 08/26/2021   BUN 13 08/26/2021   CO2 29 08/26/2021   TSH 5.06 08/26/2021   INR 1.02 01/12/2016   HGBA1C 5.1 10/14/2016   BP Readings from Last 3 Encounters:  11/11/21 90/70  11/04/21 (!) 153/79  10/25/21 137/76  ED visit evaluation reviewed 1 view chest x-ray was considered scarring but no acute process. Hard to compare x rays  not worse    ASSESSMENT AND PLAN:  Discussed the following assessment and plan:  Lightheadedness - Plan: Basic metabolic panel, CBC with Differential/Platelet, TSH, Vitamin B12, Vitamin B12, TSH, CBC with Differential/Platelet, Basic metabolic panel  Osteoporosis, unspecified osteoporosis type, unspecified pathological fracture presence - Plan: Basic metabolic panel, CBC with Differential/Platelet, TSH, Vitamin B12, VITAMIN D 25 Hydroxy (Vit-D Deficiency, Fractures), VITAMIN D 25 Hydroxy (Vit-D Deficiency, Fractures), Vitamin B12, TSH, CBC with Differential/Platelet, Basic metabolic panel  History of fall - left cw discomfort   - Plan: Basic metabolic panel, CBC with Differential/Platelet, TSH, Vitamin B12, Vitamin B12, TSH, CBC with Differential/Platelet, Basic metabolic panel  Dyspnea, unspecified type  Medication management - Plan: Basic metabolic  panel, CBC with Differential/Platelet, TSH, Vitamin B12, VITAMIN D 25 Hydroxy (Vit-D Deficiency, Fractures), VITAMIN D 25 Hydroxy (Vit-D Deficiency, Fractures), Vitamin B12, TSH, CBC with Differential/Platelet, Basic metabolic panel  Anticoagulant long-term use - Plan: Basic metabolic panel, CBC with Differential/Platelet, TSH, Vitamin B12, Vitamin B12, TSH, CBC with Differential/Platelet, Basic metabolic panel  Low vitamin B12 level - Plan: Basic  metabolic panel, CBC with Differential/Platelet, TSH, Vitamin B12, Vitamin B12, TSH, CBC with Differential/Platelet, Basic metabolic panel Patient states she does not feel right most recently but this may have been before the fall appears to be cognitively intact fall appears to be mechanical cause. We will get updated labs today and postpone her Prolia till next week she is not hypotensive at this time. -Patient advised to return or notify health care team  if  new concerns arise. Record review ED visit cards   evaluate plan counseling 45 minutes Will share with dr Stanford Breed  Patient Instructions  You probabaly have a mild concussion  but checking for anemia  and other causes.  Your lungs are clear today and good O2 level .   Stay hydrated .    Use  your cane .   Plan get appt for prolia for next week .   Will share infor with Dr Stanford Breed.   Wt Readings from Last 3 Encounters:  11/11/21 110 lb (49.9 kg)  11/04/21 114 lb 6.7 oz (51.9 kg)  10/25/21 114 lb 6.4 oz (51.9 kg)     Jani Ploeger K. Krystena Reitter M.D.

## 2021-11-14 NOTE — Progress Notes (Signed)
Blood work results are normal except vitamin B12 is on the high side.  No Information you are not anemic.

## 2021-11-18 ENCOUNTER — Ambulatory Visit (INDEPENDENT_AMBULATORY_CARE_PROVIDER_SITE_OTHER): Payer: Medicare Other

## 2021-11-18 DIAGNOSIS — M810 Age-related osteoporosis without current pathological fracture: Secondary | ICD-10-CM

## 2021-11-18 MED ORDER — DENOSUMAB 60 MG/ML ~~LOC~~ SOSY
60.0000 mg | PREFILLED_SYRINGE | Freq: Once | SUBCUTANEOUS | Status: AC
  Administered 2021-11-18: 60 mg via SUBCUTANEOUS

## 2021-11-18 NOTE — Progress Notes (Signed)
Per orders of Dr. Regis Bill, injection of Prolia given by Franco Collet. Patient tolerated injection well.

## 2021-11-25 ENCOUNTER — Telehealth: Payer: Self-pay

## 2021-11-25 NOTE — Telephone Encounter (Signed)
Noted  

## 2021-11-25 NOTE — Telephone Encounter (Signed)
Patient called stating her phone is fixed and can now receive calls patient would like a call back

## 2021-11-28 ENCOUNTER — Ambulatory Visit: Payer: Medicare Other | Admitting: Internal Medicine

## 2021-11-28 NOTE — Progress Notes (Signed)
She could take the B12 supplement less frequently maybe 3 times a week or once a week

## 2021-11-28 NOTE — Telephone Encounter (Signed)
Patient is requesting a phone call back form [facility] regarding her results.

## 2021-11-28 NOTE — Telephone Encounter (Signed)
Please see results note.

## 2021-11-29 ENCOUNTER — Ambulatory Visit: Payer: Medicare Other | Admitting: Internal Medicine

## 2021-12-02 ENCOUNTER — Ambulatory Visit: Payer: Medicare Other | Admitting: Internal Medicine

## 2021-12-02 ENCOUNTER — Telehealth: Payer: Self-pay | Admitting: Internal Medicine

## 2021-12-02 NOTE — Progress Notes (Signed)
ACUTE VISIT Chief Complaint  Patient presents with   Cough   Sinus Problem   HPI: [name] is a 87 y.o. female with history of osteoporosis, hypothyroidism, hyperlipidemia, allergies, severe mitral regurgitation and atrial fibrillation on chronic anticoagulation here today complaining of 3-5 weeks of respiratory symptoms.  Sinus Problem This is a recurrent problem. The current episode started 1 to 4 weeks ago. The problem is unchanged. There has been no fever. The pain is moderate. Associated symptoms include congestion, coughing, headaches and sinus pressure. Pertinent negatives include no chills, diaphoresis, ear pain, hoarse voice, shortness of breath, sneezing, sore throat or swollen glands. Past treatments include nothing.  Greenish rhinorrhea. Occasional nose bleed, she just got a humidifier, hoping it will help.  Cough is mostly non productive. Denies associated CP,SOB,or wheezing. She has not identified exacerbating or alleviating factors. Denies orthopnea or PND. It is stable.  Severe "cheek bone" pain and frontal pressure headache. Bend head down aggravates facial pain.  No heartburn but sometimes acid reflux, she is not on pharmacologic treatment. Negative for abdominal pain,nausea,melena,vomiting,or changes in bowel habits.  Review of Systems  Constitutional:  Positive for activity change, appetite change and fatigue. Negative for chills, diaphoresis and fever.  HENT:  Positive for congestion and sinus pressure. Negative for ear pain, hoarse voice, sneezing and sore throat.   Eyes:  Negative for pain and visual disturbance.  Respiratory:  Positive for cough. Negative for shortness of breath.   Genitourinary:  Negative for decreased urine volume, dysuria and hematuria.  Musculoskeletal:  Positive for gait problem.  Skin:  Negative for pallor and rash.  Allergic/Immunologic: Positive for environmental allergies.  Neurological:  Positive for headaches. Negative  for syncope, facial asymmetry and weakness.  Rest see pertinent positives and negatives per HPI.  Current Outpatient Medications on File Prior to Visit  Medication Sig Dispense Refill   albuterol (VENTOLIN HFA) 108 (90 Base) MCG/ACT inhaler Inhale 2 puffs into the lungs every 6 (six) hours as needed for wheezing or shortness of breath. 18 g 1   cholecalciferol (VITAMIN D) 1000 units tablet Take 1,000 Units by mouth daily.     DiphenhydrAMINE HCl (BENADRYL PO) Take by mouth.     ELIQUIS 2.5 MG TABS tablet TAKE 1 TABLET TWICE A DAY 180 tablet 1   ezetimibe (ZETIA) 10 MG tablet TAKE 1 TABLET DAILY 90 tablet 3   folic acid (FOLVITE) 1 MG tablet TAKE 1 TABLET DAILY 90 tablet 3   glucosamine-chondroitin 500-400 MG tablet Take 1 tablet by mouth daily.     ipratropium (ATROVENT) 0.06 % nasal spray Place 1 spray into both nostrils 2 (two) times daily as needed for rhinitis. 45 mL 5   levothyroxine (SYNTHROID) 100 MCG tablet TAKE 1 TABLET EVERY MORNING ON AN EMPTY STOMACH 90 tablet 3   Melatonin 3 MG TABS Take 3 mg by mouth at bedtime.      MULTIPLE VITAMIN PO Take 1 tablet by mouth daily. 50+ Senior Vitamin Daily     omeprazole (PRILOSEC) 40 MG capsule TAKE 1 CAPSULE DAILY 90 capsule 1   ondansetron (ZOFRAN) 4 MG tablet Take 1 tablet (4 mg total) by mouth every 6 (six) hours. 12 tablet 0   Polyethyl Glycol-Propyl Glycol (SYSTANE OP) Place 1 drop into both eyes 2 (two) times daily. Use 1-2 Drops in both eyes     rosuvastatin (CRESTOR) 5 MG tablet TAKE ONE-HALF (1/2) TABLET DAILY 90 tablet 1   SALINE NASAL SPRAY NA Place 1 spray into  both nostrils 2 (two) times daily as needed (congestion).      triamcinolone (NASACORT) 55 MCG/ACT AERO nasal inhaler Place 1 spray into the nose daily.     vitamin B-12 (CYANOCOBALAMIN) 1000 MCG tablet Take 1,000 mcg by mouth daily.     No current facility-administered medications on file prior to visit.   Past Medical History:  Diagnosis Date   Allergy    Anemia     Asymptomatic carotid artery stenosis    R ICA 40% stenosis on CTA 12/2011.   Bladder polyps    Cataract    BILATERAL-REMOVED   CHF (congestive heart failure) (HCC)    Diverticulosis    Elevated blood pressure 03/25/2011   Bp readings borderline today has hx of elvation  In office and ok at home   Not checke recently   She gfeels was elevated from anxiety     Episodic recurrent vertigo    MRI Head 2003   Esophageal spasm    GERD (gastroesophageal reflux disease)    Hepatic hemangioma    History of hepatitis    unknown type   Hyperlipidemia    Hyperplastic colon polyp    Hypothyroidism    Intestinal metaplasia of gastric mucosa    Osteoarthritis    Osteoporosis    Patellar fracture 07/13/2012   Scapular fracture 03/31/2012   small from direct blow with trip     Subclavian steal syndrome    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; CTA 2013 revealed open graft   Allergies  Allergen Reactions   Codeine Nausea And Vomiting   Risedronate Sodium     Upset stomach. Could take Fosamax.   Statins     Muscles hurt, can take low dose    Tape Other (See Comments)    Blisters, Please use "paper" tape   Amoxicillin-Pot Clavulanate Diarrhea    Not allergic  2014, Pt can take z pack only Not allergic  2014, Pt can take z pack only   Social History   Socioeconomic History   Marital status: Widowed    Spouse name: Not on file   Number of children: 4   Years of education: Not on file   Highest education level: Bachelor's degree (e.g., BA, AB, BS)  Occupational History   Occupation: retired Pharmacist, hospital  Tobacco Use   Smoking status: Former    Packs/day: 0.75    Years: 20.00    Pack years: 15.00    Types: Cigarettes    Quit date: 01/05/1991    Years since quitting: 30.9   Smokeless tobacco: Never  Vaping Use   Vaping Use: Never used  Substance and Sexual Activity   Alcohol use: Yes    Alcohol/week: 4.0 standard drinks    Types: 4 Glasses of wine per week    Comment: socially    Drug use: No   Sexual activity: Not on file  Other Topics Concern   Not on file  Social History Narrative   Widowed.  Lives alone in a one story home.  Has 4 children (3 living).  Retired [employer]    HH of 1    No pets   Former smoker   Exercises regularly   Right handed   Social Determinants of Radio broadcast assistant Strain: Low Risk    Difficulty of Paying Living Expenses: Not hard at all  Food Insecurity: No Food Insecurity   Worried About Charity fundraiser in the Last Year: Never true  Ran Out of Food in the Last Year: Never true  Transportation Needs: No Transportation Needs   Lack of Transportation (Medical): No   Lack of Transportation (Non-Medical): No  Physical Activity: Insufficiently Active   Days of Exercise per Week: 4 days   Minutes of Exercise per Session: 10 min  Stress: No Stress Concern Present   Feeling of Stress : Not at all  Social Connections: Moderately Integrated   Frequency of Communication with Friends and Family: More than three times a week   Frequency of Social Gatherings with Friends and Family: Three times a week   Attends Religious Services: More than 4 times per year   Active Member of Clubs or Organizations: Yes   Attends Archivist Meetings: 1 to 4 times per year   Marital Status: Widowed   Vitals:   12/03/21 1537  BP: 132/72  Pulse: 94  Resp: 16  Temp: 97.8 F (36.6 C)  SpO2: 95%   Body mass index is 21.01 kg/m.  Physical Exam Vitals and nursing note reviewed.  Constitutional:      General: She is not in acute distress.    Appearance: She is well-developed and normal weight. She is not ill-appearing.  HENT:     Head: Normocephalic and atraumatic.     Right Ear: Tympanic membrane, ear canal and external ear normal.     Left Ear: External ear normal.     Ears:     Comments: Left ear canal excess cerumen, could not see TM.    Nose: Congestion and rhinorrhea present.     Right Sinus: Maxillary  sinus tenderness present. No frontal sinus tenderness.     Left Sinus: Maxillary sinus tenderness present. No frontal sinus tenderness.     Mouth/Throat:     Mouth: Mucous membranes are moist.     Pharynx: No posterior oropharyngeal erythema.     Comments: Thick greenish post nasal drainage. Eyes:     Conjunctiva/sclera: Conjunctivae normal.  Cardiovascular:     Rate and Rhythm: Normal rate and regular rhythm.     Heart sounds: No murmur heard. Pulmonary:     Effort: Pulmonary effort is normal. No respiratory distress.     Breath sounds: Normal breath sounds. No stridor.  Lymphadenopathy:     Head:     Right side of head: No submandibular adenopathy.     Left side of head: No submandibular adenopathy.     Cervical: No cervical adenopathy.  Skin:    General: Skin is warm.     Findings: No erythema or rash.  Neurological:     Mental Status: She is alert and oriented to person, place, and time.     Comments: Unstable gait, assisted with a cane.  Psychiatric:        Speech: Speech normal.     Comments: Well groomed, good eye contact.   ASSESSMENT AND PLAN:  [name] was seen today for cough.  Diagnoses and all orders for this visit:  Acute non-recurrent maxillary sinusitis Problem has been going on for over 2 weeks and getting worse. We discussed differential Dx. Abx treatment recommended, side effects discussed. Recommend starting a daily probiotic, which n=may help to prevent diarrhea. Some side effects of Nasacort reviewed, can aggravate nose bleed. Nasal saline irrigations as needed.  -     cefpodoxime (VANTIN) 200 MG tablet; Take 1 tablet (200 mg total) by mouth 2 (two) times daily for 7 days.  Cough, unspecified type We discussed possible etiologies. Lung  auscultation negative. I do not think CXR is needed, we have the service available , so offered if she wants it, she agrees with holding on it. Instructed about warning signs.  Gastroesophageal reflux disease,  unspecified whether esophagitis present Could be a contributing factor for cough. GERD precautions. If cough is persistent , PPI trial can be considered.  Return in about 2 weeks (around 12/17/2021), or if symptoms worsen or fail to improve, for PCP.  Drue G. Martinique, MD  [facility]. [place] office.

## 2021-12-02 NOTE — Telephone Encounter (Signed)
Patient calling in with respiratory symptoms: Shortness of breath, chest pain, palpitations or other red words send to Triage  Does the patient have a fever over 100, cough, congestion, sore throat, runny nose, lost of taste/smell within the last 5 days (please list symptoms that patient has)?  Have you tested for Covid in the last 5 days? Yes   If yes, was it positive []  OR negative [x] ? If positive in the last 5 days, please schedule virtual visit now. If negative, schedule for an in person OV with the next available provider if PCP has no openings. Please also let patient know they will be tested again (follow the script below)  "you will have to arrive 69mins prior to your appt time to be Covid tested. Please park in back of office at the cone & call [phone] to let the staff know you have arrived. A staff member will meet you at your car to do a rapid covid test. Once the test has resulted you will be notified by phone of your results to determine if appt will remain an in person visit or be converted to a virtual/phone visit. If you arrive less than 50mins before your appt time, your visit will be automatically converted to virtual & any recommended testing will happen AFTER the visit."   [place]  If no availability for virtual visit in office,  please schedule another  office  If no availability at another [place] office, please instruct patient that they can schedule an evisit or virtual visit through their mychart account. Visits up to 8pm  patients can be seen in office 5 days after positive COVID test

## 2021-12-03 ENCOUNTER — Ambulatory Visit (INDEPENDENT_AMBULATORY_CARE_PROVIDER_SITE_OTHER): Payer: Medicare Other | Admitting: Family Medicine

## 2021-12-03 ENCOUNTER — Encounter: Payer: Self-pay | Admitting: Family Medicine

## 2021-12-03 VITALS — BP 132/72 | HR 94 | Temp 97.8°F | Resp 16 | Ht 61.0 in | Wt 111.2 lb

## 2021-12-03 DIAGNOSIS — K219 Gastro-esophageal reflux disease without esophagitis: Secondary | ICD-10-CM

## 2021-12-03 DIAGNOSIS — R059 Cough, unspecified: Secondary | ICD-10-CM

## 2021-12-03 DIAGNOSIS — J0100 Acute maxillary sinusitis, unspecified: Secondary | ICD-10-CM | POA: Diagnosis not present

## 2021-12-03 DIAGNOSIS — I2510 Atherosclerotic heart disease of native coronary artery without angina pectoris: Secondary | ICD-10-CM

## 2021-12-03 MED ORDER — CEFPODOXIME PROXETIL 200 MG PO TABS: 200.0000 mg | ORAL_TABLET | Freq: Two times a day (BID) | ORAL | 0 refills | Status: AC

## 2021-12-03 NOTE — Patient Instructions (Addendum)
A few things to remember from today's visit:  Acute non-recurrent maxillary sinusitis - Plan: cefpodoxime (VANTIN) 200 MG tablet  Cough, unspecified type  Gastroesophageal reflux disease, unspecified whether esophagitis present  Do not use My Chart to request refills or for acute issues that need immediate attention.   Antibiotic can cause diarrhea, so start a daily probiotic, Align 1 cap daily. Plain Mucinex may help. Acid reflux can aggravate cough. Today lungs are clear, so we are holding on X ray. Please arrange a 2 weeks follow up is cough is still present. Nasal saline irrigations.  Please be sure medication list is accurate. If a new problem present, please set up appointment sooner than planned today.

## 2021-12-03 NOTE — Telephone Encounter (Signed)
Pt states denies fever. Pt states she has sinus congestion: painful cheek, cough, SOB is chronic for her, is not able to tell a difference. Pt states she has been having intermittent symptoms x 5 weeks, but reports it as worse x 2 weeks b/c she has a h/a & non productive cough; has noted green/gray mucous from nose.  Pt able to repeat back instructions she received for testing prior to the appt.

## 2021-12-03 NOTE — Telephone Encounter (Signed)
Dr Martinique aware of pt symptoms. States pt does not need to be covid tested prior to coming in office.  Pt notified of PCP response; instructions given to arrive by 3:15 & can check in through the front door. Pt verb understanding.

## 2021-12-17 ENCOUNTER — Ambulatory Visit: Payer: Medicare Other | Admitting: Allergy and Immunology

## 2021-12-17 DIAGNOSIS — Z961 Presence of intraocular lens: Secondary | ICD-10-CM | POA: Diagnosis not present

## 2021-12-24 ENCOUNTER — Other Ambulatory Visit: Payer: Self-pay

## 2021-12-24 ENCOUNTER — Encounter: Payer: Self-pay | Admitting: Allergy and Immunology

## 2021-12-24 ENCOUNTER — Ambulatory Visit (INDEPENDENT_AMBULATORY_CARE_PROVIDER_SITE_OTHER): Payer: Medicare Other | Admitting: Allergy and Immunology

## 2021-12-24 VITALS — BP 114/58 | HR 96 | Temp 97.3°F | Resp 16 | Ht 60.03 in | Wt 110.4 lb

## 2021-12-24 DIAGNOSIS — J438 Other emphysema: Secondary | ICD-10-CM | POA: Diagnosis not present

## 2021-12-24 DIAGNOSIS — J3089 Other allergic rhinitis: Secondary | ICD-10-CM

## 2021-12-24 DIAGNOSIS — I2510 Atherosclerotic heart disease of native coronary artery without angina pectoris: Secondary | ICD-10-CM

## 2021-12-24 DIAGNOSIS — J324 Chronic pansinusitis: Secondary | ICD-10-CM

## 2021-12-24 DIAGNOSIS — K219 Gastro-esophageal reflux disease without esophagitis: Secondary | ICD-10-CM | POA: Diagnosis not present

## 2021-12-24 MED ORDER — IPRATROPIUM-ALBUTEROL 0.5-2.5 (3) MG/3ML IN SOLN: 3.0000 mL | Freq: Four times a day (QID) | RESPIRATORY_TRACT | 2 refills | Status: DC | PRN

## 2021-12-24 MED ORDER — OMEPRAZOLE 40 MG PO CPDR: 40.0000 mg | DELAYED_RELEASE_CAPSULE | Freq: Every day | ORAL | 4 refills | Status: DC

## 2021-12-24 MED ORDER — ALBUTEROL SULFATE HFA 108 (90 BASE) MCG/ACT IN AERS: 2.0000 | INHALATION_SPRAY | Freq: Four times a day (QID) | RESPIRATORY_TRACT | 2 refills | Status: DC | PRN

## 2021-12-24 MED ORDER — IPRATROPIUM BROMIDE 0.06 % NA SOLN
1.0000 | Freq: Two times a day (BID) | NASAL | 4 refills | Status: DC | PRN
Start: 2021-12-24 — End: 2022-03-07

## 2021-12-24 MED ORDER — LORATADINE 10 MG PO TABS: 10.0000 mg | ORAL_TABLET | Freq: Two times a day (BID) | ORAL | 11 refills | Status: DC | PRN

## 2021-12-24 MED ORDER — AZITHROMYCIN 250 MG PO TABS: 250.0000 mg | ORAL_TABLET | Freq: Every day | ORAL | 0 refills | Status: AC

## 2021-12-24 NOTE — Progress Notes (Signed)
[place] [facility] [place] [facility] [place]   Follow-up Note  Referring Provider: Burnis Medin, MD Primary Provider: Burnis Medin, MD Date of Office Visit: 12/24/2021  Subjective:   Amy Fischer (DOB: [birth year]) is a 87 y.o. female who returns to the [facility] on 12/24/2021 in re-evaluation of the following:  HPI: Amilya returns to this clinic in evaluation of rhinitis and LPR and a history of emphysema.  Her last visit to this clinic was 25 December 2020.  Overall she felt as though her airway was doing very well and she had very little problems with her lower airway and did not require any type of specific therapy to address any lower airway issue.  She does not really exert herself to any extent because of a balance issue.  She rarely uses any short acting bronchodilator.  Her upper airways were doing well though she had longstanding anosmia ever since her COVID infection in November 2021.  About a month ago she apparently developed a "sinus infection" and was treated with an antibiotic.  Ever since that event she has continued to have constant postnasal drip and a irritated throat and some throat clearing.    She does not think that her reflux has been active.  She is not treating any reflux at this point in time.  She has received 3 COVID vaccines and as noted above sustained a COVID infection November 2021.  She has received this year's flu vaccine.  Allergies as of 12/24/2021       Reactions   Codeine Nausea And Vomiting   Risedronate Sodium    Upset stomach. Could take Fosamax.   Statins    Muscles hurt, can take low dose   Tape Other (See Comments)   Blisters, Please use "paper" tape   Amoxicillin-pot Clavulanate Diarrhea   Not allergic  2014, Pt can take z pack only Not allergic  2014, Pt can take z pack only        Medication List    albuterol 108 (90 Base) MCG/ACT inhaler Commonly known as: VENTOLIN HFA Inhale 2 puffs  into the lungs every 6 (six) hours as needed for wheezing or shortness of breath.   BENADRYL PO Take by mouth.   cholecalciferol 1000 units tablet Commonly known as: VITAMIN D Take 1,000 Units by mouth daily.   Eliquis 2.5 MG Tabs tablet Generic drug: apixaban TAKE 1 TABLET TWICE A DAY   ezetimibe 10 MG tablet Commonly known as: ZETIA TAKE 1 TABLET DAILY   folic acid 1 MG tablet Commonly known as: FOLVITE TAKE 1 TABLET DAILY   glucosamine-chondroitin 500-400 MG tablet Take 1 tablet by mouth daily.   ipratropium 0.06 % nasal spray Commonly known as: ATROVENT Place 1 spray into both nostrils 2 (two) times daily as needed for rhinitis.   levothyroxine 100 MCG tablet Commonly known as: SYNTHROID TAKE 1 TABLET EVERY MORNING ON AN EMPTY STOMACH   melatonin 3 MG Tabs tablet Take 3 mg by mouth at bedtime.   MULTIPLE VITAMIN PO Take 1 tablet by mouth daily. 50+ Senior Vitamin Daily   omeprazole 40 MG capsule Commonly known as: PRILOSEC TAKE 1 CAPSULE DAILY   ondansetron 4 MG tablet Commonly known as: ZOFRAN Take 1 tablet (4 mg total) by mouth every 6 (six) hours.   rosuvastatin 5 MG tablet Commonly known as: CRESTOR TAKE ONE-HALF (1/2) TABLET DAILY   SALINE NASAL SPRAY NA Place 1 spray into both nostrils  2 (two) times daily as needed (congestion).   SYSTANE OP Place 1 drop into both eyes 2 (two) times daily. Use 1-2 Drops in both eyes   triamcinolone 55 MCG/ACT Aero nasal inhaler Commonly known as: NASACORT Place 1 spray into the nose daily.   vitamin B-12 1000 MCG tablet Commonly known as: CYANOCOBALAMIN Take 1,000 mcg by mouth daily.    Past Medical History:  Diagnosis Date   Allergy    Anemia    Asymptomatic carotid artery stenosis    R ICA 40% stenosis on CTA 12/2011.   Bladder polyps    Cataract    BILATERAL-REMOVED   CHF (congestive heart failure) (HCC)    Diverticulosis    Elevated blood pressure 03/25/2011   Bp readings borderline today has  hx of elvation  In office and ok at home   Not checke recently   She gfeels was elevated from anxiety     Episodic recurrent vertigo    MRI Head 2003   Esophageal spasm    GERD (gastroesophageal reflux disease)    Hepatic hemangioma    History of hepatitis    unknown type   Hyperlipidemia    Hyperplastic colon polyp    Hypothyroidism    Intestinal metaplasia of gastric mucosa    Osteoarthritis    Osteoporosis    Patellar fracture 07/13/2012   Scapular fracture 03/31/2012   small from direct blow with trip     Subclavian steal syndrome    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; CTA 2013 revealed open graft    Past Surgical History:  Procedure Laterality Date   APPENDECTOMY     CARDIAC CATHETERIZATION N/A 10/04/2015   Procedure: Right/Left Heart Cath and Coronary Angiography;  Surgeon: Belva Crome, MD;  Location[facility];  Service: Cardiovascular;  Laterality: N/A;   CAROTID-SUBCLAVIAN BYPASS GRAFT Left 1999   for [place] steal syndrome   CHOLECYSTECTOMY     COLONOSCOPY     CYSTECTOMY Left    hand   ELBOW SURGERY Left    KNEE SURGERY Left 2014   MITRAL VALVE REPAIR N/A 10/10/2015   Procedure: MITRAL VALVE REPAIR (MVR) WITH SIZE 28 CARPENTIER-EDWARDS PHYSIO II ANNULOPLASTY RING;  Surgeon: [name], MD;  Location: [facility];  Service: Open Heart Surgery;  Laterality: N/A;   ROTATOR CUFF REPAIR Left    TEAR DUCT PROBING  07/2014   TEE WITHOUT CARDIOVERSION N/A 10/03/2015   Procedure: TRANSESOPHAGEAL ECHOCARDIOGRAM (TEE);  Surgeon: Larey Dresser, MD;  Location: [facility];  Service: Cardiovascular;  Laterality: N/A;   TEE WITHOUT CARDIOVERSION N/A 10/10/2015   Procedure: TRANSESOPHAGEAL ECHOCARDIOGRAM (TEE);  Surgeon: [name], MD;  Location: [facility];  Service: Open Heart Surgery;  Laterality: N/A;   TONSILLECTOMY     TUBAL LIGATION      Review of systems negative except as noted in HPI / PMHx or noted below:  Review of Systems  Constitutional:  Negative.   HENT: Negative.    Eyes: Negative.   Respiratory: Negative.    Cardiovascular: Negative.   Gastrointestinal: Negative.   Genitourinary: Negative.   Musculoskeletal: Negative.   Skin: Negative.   Neurological: Negative.   Endo/Heme/Allergies: Negative.   Psychiatric/Behavioral: Negative.      Objective:   Vitals:   12/24/21 1133  BP: (!) 114/58  Pulse: 96  Resp: 16  Temp: (!) 97.3 F (36.3 C)  SpO2: 96%   Height: 5' 0.03" (152.5 cm)  Weight: 110 lb 6.4  oz (50.1 kg)   Physical Exam Constitutional:      Appearance: She is not diaphoretic.  HENT:     Head: Normocephalic.     Right Ear: Tympanic membrane, ear canal and external ear normal.     Left Ear: Tympanic membrane, ear canal and external ear normal.     Nose: Nose normal. No mucosal edema or rhinorrhea.     Mouth/Throat:     Pharynx: Uvula midline. Posterior oropharyngeal erythema present. No oropharyngeal exudate.  Eyes:     Conjunctiva/sclera: Conjunctivae normal.  Neck:     Thyroid: No thyromegaly.     Trachea: Trachea normal. No tracheal tenderness or tracheal deviation.  Cardiovascular:     Rate and Rhythm: Normal rate and regular rhythm.     Heart sounds: Normal heart sounds, S1 normal and S2 normal. No murmur heard. Pulmonary:     Effort: No respiratory distress.     Breath sounds: Normal breath sounds. No stridor. No wheezing or rales.  Lymphadenopathy:     Head:     Right side of head: No tonsillar adenopathy.     Left side of head: No tonsillar adenopathy.     Cervical: No cervical adenopathy.  Skin:    Findings: No erythema or rash.     Nails: There is no clubbing.  Neurological:     Mental Status: She is alert.    Diagnostics: none   Assessment and Plan:   1. Other allergic rhinitis   2. LPRD (laryngopharyngeal reflux disease)   3. Other emphysema ([place])   4. Chronic pansinusitis     1.  For this recent drainage / throat issue, use the following:   A. Treat infection -  Azithromycin 250 - 1 tablet 1 time per day for 10 days  B. Treat reflux / LPR - omeprazole 40 mg - 1 time per day for 1 month  2. If needed:    A. Albuterol HFA - 2 inhalations or Duoneb nebulization every 6 hours    B. Loratadine 10 mg one time per day  C. Nasal ipratropium 0.06 - 1-2 sprays each nostril every 6 hours   D. Omeprazole 40 mg tablet 1 time per day  3. Return to clinic in 1 year or earlier if problem  I think that Elka may have a lingering sinus infection although certainly some of her throat issues could be from her LPR.  I have asked her to use a broad-spectrum antibiotic using azithromycin for the next 10 days and we will have her restart her omeprazole for at least the next month.  She will keep in contact with me noting her response to this approach.  Assuming she does well, she can use all of her medications as needed as noted above and I will see her back in his clinic in 1 year or earlier if there is a problem.  Allena Katz, MD Allergy / Immunology [facility] [place]

## 2021-12-24 NOTE — Patient Instructions (Addendum)
° °  1.  For this recent drainage / throat issue, use the following:   A. Treat infection - Azithromycin 250 - 1 tablet 1 time per day for 10 days  B. Treat reflux / LPR - omeprazole 40 mg - 1 time per day for 1 month  2. If needed:    A. Albuterol HFA - 2 inhalations or Duoneb nebulization every 6 hours    B. Loratadine 10 mg one time per day  C. Nasal ipratropium 0.06 - 1-2 sprays each nostril every 6 hours   D. Omeprazole 40 mg tablet 1 time per day  3. Return to clinic in 1 year or earlier if problem

## 2021-12-25 ENCOUNTER — Encounter: Payer: Self-pay | Admitting: Internal Medicine

## 2021-12-25 ENCOUNTER — Encounter: Payer: Self-pay | Admitting: Allergy and Immunology

## 2021-12-25 ENCOUNTER — Ambulatory Visit (INDEPENDENT_AMBULATORY_CARE_PROVIDER_SITE_OTHER): Payer: Medicare Other | Admitting: Internal Medicine

## 2021-12-25 VITALS — BP 124/78 | HR 96 | Temp 98.4°F | Ht 60.3 in | Wt 107.6 lb

## 2021-12-25 DIAGNOSIS — Z79899 Other long term (current) drug therapy: Secondary | ICD-10-CM

## 2021-12-25 DIAGNOSIS — I2510 Atherosclerotic heart disease of native coronary artery without angina pectoris: Secondary | ICD-10-CM | POA: Diagnosis not present

## 2021-12-25 DIAGNOSIS — R059 Cough, unspecified: Secondary | ICD-10-CM | POA: Diagnosis not present

## 2021-12-25 DIAGNOSIS — R053 Chronic cough: Secondary | ICD-10-CM

## 2021-12-25 LAB — POC COVID19 BINAXNOW: SARS Coronavirus 2 Ag: NEGATIVE

## 2021-12-25 NOTE — Patient Instructions (Addendum)
Begin the antibiotic and  omeprazole  as per dr Carmelina Peal. Lungs are clear today .   Last chest x ray is ok in December .    If not getting better    can fu in another month or so .  Depending .   Continue balance   activities .

## 2021-12-25 NOTE — Progress Notes (Signed)
Chief Complaint  Patient presents with   Follow-up    HPI: Amy Fischer 87 y.o. come in for Chronic disease management   See nov eval for dizziness lbp and hx of fall   On osteoporosis  prolia   Has had ongoing respiratory sinus symptoms was treated with Omnicef or similar in November with some improvement and then increasing again something was a little worse yesterday with drainage cough sinus.  No fever. Saw Dr. Neldon Mc yesterday recommended a 10-day course of azithromycin and a 1 month course of omeprazole 40 mg. No shortness of breath no recent falling Feels like she is declining and losing weight. No more low blood pressure. ROS: See pertinent positives and negatives per HPI.  Past Medical History:  Diagnosis Date   Allergy    Anemia    Asymptomatic carotid artery stenosis    R ICA 40% stenosis on CTA 12/2011.   Bladder polyps    Cataract    BILATERAL-REMOVED   CHF (congestive heart failure) (HCC)    Diverticulosis    Elevated blood pressure 03/25/2011   Bp readings borderline today has hx of elvation  In office and ok at home   Not checke recently   She gfeels was elevated from anxiety     Episodic recurrent vertigo    MRI Head 2003   Esophageal spasm    GERD (gastroesophageal reflux disease)    Hepatic hemangioma    History of hepatitis    unknown type   Hyperlipidemia    Hyperplastic colon polyp    Hypothyroidism    Intestinal metaplasia of gastric mucosa    Osteoarthritis    Osteoporosis    Patellar fracture 07/13/2012   Scapular fracture 03/31/2012   small from direct blow with trip     Subclavian steal syndrome    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; CTA 2013 revealed open graft    Family History  Problem Relation Age of Onset   Hypertension Mother    Stroke Mother    Heart disease Father    Colon cancer Neg Hx    Neurofibromatosis Neg Hx    Allergic rhinitis Neg Hx    Asthma Neg Hx    Angioedema Neg Hx    Atopy Neg Hx    Eczema Neg  Hx    Immunodeficiency Neg Hx    Urticaria Neg Hx     Social History   Socioeconomic History   Marital status: Widowed    Spouse name: Not on file   Number of children: 4   Years of education: Not on file   Highest education level: Bachelor's degree (e.g., BA, AB, BS)  Occupational History   Occupation: retired Pharmacist, hospital  Tobacco Use   Smoking status: Former    Packs/day: 0.75    Years: 20.00    Pack years: 15.00    Types: Cigarettes    Quit date: 01/05/1991    Years since quitting: 30.9   Smokeless tobacco: Never  Vaping Use   Vaping Use: Never used  Substance and Sexual Activity   Alcohol use: Yes    Alcohol/week: 4.0 standard drinks    Types: 4 Glasses of wine per week    Comment: socially   Drug use: No   Sexual activity: Not on file  Other Topics Concern   Not on file  Social History Narrative   Widowed.  Lives alone in a one story home.  Has 4 children (3 living).  Retired first  grade teacher.    HH of 1    No pets   Former smoker   Exercises regularly   Right handed   Social Determinants of Radio broadcast assistant Strain: Low Risk    Difficulty of Paying Living Expenses: Not hard at all  Food Insecurity: No Food Insecurity   Worried About Charity fundraiser in the Last Year: Never true   Arboriculturist in the Last Year: Never true  Transportation Needs: No Transportation Needs   Lack of Transportation (Medical): No   Lack of Transportation (Non-Medical): No  Physical Activity: Insufficiently Active   Days of Exercise per Week: 4 days   Minutes of Exercise per Session: 10 min  Stress: No Stress Concern Present   Feeling of Stress : Not at all  Social Connections: Moderately Integrated   Frequency of Communication with Friends and Family: More than three times a week   Frequency of Social Gatherings with Friends and Family: Three times a week   Attends Religious Services: More than 4 times per year   Active Member of Clubs or Organizations: Yes    Attends Archivist Meetings: 1 to 4 times per year   Marital Status: Widowed    Outpatient Medications Prior to Visit  Medication Sig Dispense Refill   albuterol (VENTOLIN HFA) 108 (90 Base) MCG/ACT inhaler Inhale 2 puffs into the lungs every 6 (six) hours as needed for wheezing or shortness of breath. 18 g 2   cholecalciferol (VITAMIN D) 1000 units tablet Take 1,000 Units by mouth daily.     DiphenhydrAMINE HCl (BENADRYL PO) Take by mouth.     ELIQUIS 2.5 MG TABS tablet TAKE 1 TABLET TWICE A DAY 180 tablet 1   ezetimibe (ZETIA) 10 MG tablet TAKE 1 TABLET DAILY 90 tablet 3   folic acid (FOLVITE) 1 MG tablet TAKE 1 TABLET DAILY 90 tablet 3   glucosamine-chondroitin 500-400 MG tablet Take 1 tablet by mouth daily.     ipratropium (ATROVENT) 0.06 % nasal spray Place 1 spray into both nostrils 2 (two) times daily as needed for rhinitis. 45 mL 4   ipratropium-albuterol (DUONEB) 0.5-2.5 (3) MG/3ML SOLN Take 3 mLs by nebulization every 6 (six) hours as needed. 150 mL 2   levothyroxine (SYNTHROID) 100 MCG tablet TAKE 1 TABLET EVERY MORNING ON AN EMPTY STOMACH 90 tablet 3   loratadine (CLARITIN) 10 MG tablet Take 1 tablet (10 mg total) by mouth 2 (two) times daily as needed for allergies (Use an extra dose during flare ups.). 60 tablet 11   Melatonin 3 MG TABS Take 3 mg by mouth at bedtime.      MULTIPLE VITAMIN PO Take 1 tablet by mouth daily. 50+ Senior Vitamin Daily     ondansetron (ZOFRAN) 4 MG tablet Take 1 tablet (4 mg total) by mouth every 6 (six) hours. 12 tablet 0   Polyethyl Glycol-Propyl Glycol (SYSTANE OP) Place 1 drop into both eyes 2 (two) times daily. Use 1-2 Drops in both eyes     rosuvastatin (CRESTOR) 5 MG tablet TAKE ONE-HALF (1/2) TABLET DAILY 90 tablet 1   SALINE NASAL SPRAY NA Place 1 spray into both nostrils 2 (two) times daily as needed (congestion).      triamcinolone (NASACORT) 55 MCG/ACT AERO nasal inhaler Place 1 spray into the nose daily.     vitamin B-12  (CYANOCOBALAMIN) 1000 MCG tablet Take 1,000 mcg by mouth daily.     azithromycin (ZITHROMAX) 250 MG  tablet Take 1 tablet (250 mg total) by mouth daily for 10 days. 10 each 0   omeprazole (PRILOSEC) 40 MG capsule Take 1 capsule (40 mg total) by mouth daily. 90 capsule 4   No facility-administered medications prior to visit.     EXAM:  BP 124/78 (BP Location: Left Arm, Patient Position: Sitting, Cuff Size: Normal)    Pulse 96    Temp 98.4 F (36.9 C) (Oral)    Ht 5' 0.3" (1.532 m)    Wt 107 lb 9.6 oz (48.8 kg)    SpO2 96%    BMI 20.81 kg/m   Wt Readings from Last 3 Encounters:  12/25/21 107 lb 9.6 oz (48.8 kg)  12/24/21 110 lb 6.4 oz (50.1 kg)  12/03/21 111 lb 3.2 oz (50.4 kg)    . Filed Weights   12/25/21 1048  Weight: 107 lb 9.6 oz (48.8 kg)  States that her weight at home was 110 this morning  GENERAL: vitals reviewed and listed above, alert, oriented, appears well hydrated and in no acute distress alert active ambulatory with obvious upper respiratory congestion nose blowing and occasional cough.  Color is good and no respiratory distress HEENT: atraumatic, conjunctiva  clear, no obvious abnormalities on inspection of external nose and ears TMs clear OP :masked NECK: no obvious masses on inspection palpation  LUNGS: clear to auscultation bilaterally, no wheezes, rales or rhonchi,  CV: HRRR, no clubbing cyanosis or  peripheral edema nl cap refill  MS: moves all extremities without noticeable focal  abnormality DJD OA changes PSYCH: pleasant and cooperative, no obvious depression or anxiety Lab Results  Component Value Date   WBC 6.9 11/11/2021   HGB 13.9 11/11/2021   HCT 41.9 11/11/2021   PLT 259.0 11/11/2021   GLUCOSE 80 11/11/2021   CHOL 158 01/28/2021   TRIG 100.0 01/28/2021   HDL 65.50 01/28/2021   LDLDIRECT 76.0 08/19/2019   LDLCALC 73 01/28/2021   ALT 14 08/26/2021   AST 18 08/26/2021   NA 140 11/11/2021   K 4.5 11/11/2021   CL 105 11/11/2021   CREATININE 0.84  11/11/2021   BUN 19 11/11/2021   CO2 27 11/11/2021   TSH 4.10 11/11/2021   INR 1.02 01/12/2016   HGBA1C 5.1 10/14/2016   BP Readings from Last 3 Encounters:  12/25/21 124/78  12/24/21 (!) 114/58  12/03/21 132/72   Lab Results  Component Value Date   [id number] 119 (H) 11/11/2021  Updated COVID test in office rapid negative Portable x-ray November 7 showed no AFib evidence of acute cardiopulmonary disease Was done in the context of a fall in the ED  Chest x-ray done 10/25/2021 is written as hazy patchy bilateral airspace process right greater than left possible infection although could be seen in amiodarone toxicity plan follow-up  ASSESSMENT AND PLAN:  Discussed the following assessment and plan:  Medication management  Cough, persistent - Plan: POC COVID-19  Cough, unspecified type  Weight is wavering last set of labs were reasonable and last chest x-ray was clear albeit curious that the findings that were considered abnormal on October 28 were apparently gone November 7. She reports that her cough and congestion is never been back to baseline since she had COVID last year but it does seem like she could have a chronic sinusitis in addition to her allergies. Advise she proceed with Dr. Bruna Potter plan omeprazole for a month and azithromycin. Follow-up in 4 to 6 months otherwise if the respiratory situation is not improving earlier. -Patient  advised to return or notify health care team  if  new concerns arise.  Patient Instructions  Begin the antibiotic and  omeprazole  as per dr Carmelina Peal. Lungs are clear today .   Last chest x ray is ok in December .    If not getting better    can fu in another month or so .  Depending .   Continue balance   activities .   [name]. Risa Auman M.D.

## 2021-12-30 DIAGNOSIS — Z20822 Contact with and (suspected) exposure to covid-19: Secondary | ICD-10-CM | POA: Diagnosis not present

## 2022-01-03 DIAGNOSIS — Z20822 Contact with and (suspected) exposure to covid-19: Secondary | ICD-10-CM | POA: Diagnosis not present

## 2022-02-10 ENCOUNTER — Other Ambulatory Visit: Payer: Self-pay | Admitting: Internal Medicine

## 2022-02-14 DIAGNOSIS — Z20822 Contact with and (suspected) exposure to covid-19: Secondary | ICD-10-CM | POA: Diagnosis not present

## 2022-02-18 DIAGNOSIS — L218 Other seborrheic dermatitis: Secondary | ICD-10-CM | POA: Diagnosis not present

## 2022-02-18 DIAGNOSIS — Z85828 Personal history of other malignant neoplasm of skin: Secondary | ICD-10-CM | POA: Diagnosis not present

## 2022-02-18 DIAGNOSIS — I788 Other diseases of capillaries: Secondary | ICD-10-CM | POA: Diagnosis not present

## 2022-02-18 DIAGNOSIS — L821 Other seborrheic keratosis: Secondary | ICD-10-CM | POA: Diagnosis not present

## 2022-02-18 DIAGNOSIS — D1801 Hemangioma of skin and subcutaneous tissue: Secondary | ICD-10-CM | POA: Diagnosis not present

## 2022-03-03 DIAGNOSIS — Z20822 Contact with and (suspected) exposure to covid-19: Secondary | ICD-10-CM | POA: Diagnosis not present

## 2022-03-07 ENCOUNTER — Other Ambulatory Visit: Payer: Self-pay | Admitting: Internal Medicine

## 2022-04-04 DIAGNOSIS — Z20822 Contact with and (suspected) exposure to covid-19: Secondary | ICD-10-CM | POA: Diagnosis not present

## 2022-04-05 ENCOUNTER — Emergency Department (HOSPITAL_BASED_OUTPATIENT_CLINIC_OR_DEPARTMENT_OTHER)
Admission: EM | Admit: 2022-04-05 | Discharge: 2022-04-05 | Disposition: A | Discharge status: Home / Self Care | Payer: Medicare Other | Attending: Emergency Medicine | Admitting: Emergency Medicine

## 2022-04-05 ENCOUNTER — Emergency Department (HOSPITAL_BASED_OUTPATIENT_CLINIC_OR_DEPARTMENT_OTHER): Payer: Medicare Other

## 2022-04-05 ENCOUNTER — Other Ambulatory Visit: Payer: Self-pay

## 2022-04-05 ENCOUNTER — Encounter (HOSPITAL_BASED_OUTPATIENT_CLINIC_OR_DEPARTMENT_OTHER): Payer: Self-pay

## 2022-04-05 DIAGNOSIS — S0083XA Contusion of other part of head, initial encounter: Secondary | ICD-10-CM | POA: Diagnosis not present

## 2022-04-05 DIAGNOSIS — W19XXXA Unspecified fall, initial encounter: Secondary | ICD-10-CM | POA: Insufficient documentation

## 2022-04-05 DIAGNOSIS — M79603 Pain in arm, unspecified: Secondary | ICD-10-CM | POA: Diagnosis not present

## 2022-04-05 DIAGNOSIS — J9811 Atelectasis: Secondary | ICD-10-CM | POA: Diagnosis not present

## 2022-04-05 DIAGNOSIS — Z7901 Long term (current) use of anticoagulants: Secondary | ICD-10-CM | POA: Diagnosis not present

## 2022-04-05 DIAGNOSIS — R0781 Pleurodynia: Secondary | ICD-10-CM | POA: Insufficient documentation

## 2022-04-05 DIAGNOSIS — J439 Emphysema, unspecified: Secondary | ICD-10-CM | POA: Diagnosis not present

## 2022-04-05 DIAGNOSIS — I700 Atherosclerosis of aorta: Secondary | ICD-10-CM | POA: Diagnosis not present

## 2022-04-05 DIAGNOSIS — Z043 Encounter for examination and observation following other accident: Secondary | ICD-10-CM | POA: Diagnosis not present

## 2022-04-05 MED ORDER — ONDANSETRON HCL 4 MG/2ML IJ SOLN
4.0000 mg | Freq: Once | INTRAMUSCULAR | Status: AC
  Administered 2022-04-05: 4 mg via INTRAVENOUS
  Filled 2022-04-05: qty 2

## 2022-04-05 MED ORDER — TRAMADOL HCL 50 MG PO TABS: 50.0000 mg | ORAL_TABLET | Freq: Four times a day (QID) | ORAL | 0 refills | Status: DC | PRN

## 2022-04-05 MED ORDER — FENTANYL CITRATE PF 50 MCG/ML IJ SOSY
50.0000 ug | PREFILLED_SYRINGE | Freq: Once | INTRAMUSCULAR | Status: AC
Start: 2022-04-05 — End: 2022-04-05
  Administered 2022-04-05: 50 ug via INTRAVENOUS
  Filled 2022-04-05: qty 1

## 2022-04-05 MED ORDER — TRAMADOL HCL 50 MG PO TABS
50.0000 mg | ORAL_TABLET | Freq: Once | ORAL | Status: AC
  Administered 2022-04-05: 50 mg via ORAL
  Filled 2022-04-05: qty 1

## 2022-04-05 NOTE — ED Provider Notes (Signed)
After the patient's discharge was informed by nursing staff that her tramadol pain medicine prescription have been erroneously sent to her mail order pharmacy, would not be available for immediate use.  The patient requested that we [facility] a prescription to a [facility].  And a prescription was sent for the same dose of tramadol, based on my review of the prior ED providers evaluation of the patient. ?  ?Amy Dusky, MD ?04/05/22 1641 ? ?

## 2022-04-05 NOTE — ED Provider Notes (Addendum)
?[facility] ?Provider Note ? ? ?CSN: [id number] ?Arrival date & time: 04/05/22  1124 ? ?  ? ?History ? ?Chief Complaint  ?Patient presents with  ? Fall  ? ? ?Amy Fischer is a 88 y.o. female. ? ?Patient is a 88 year old female presenting for fall.  Patient states she was attempting to ambulate with her walker this morning at approximately 09 100 when she fell forward doing her right chest wall on a cabinet denies LOC.  On her Xarelto.  Denies head trauma.  Admits to sided rib pain under the right breast.  Admits to pain with deep breathing.  Denies any other bony tenderness. ? ?The history is provided by the patient. No language interpreter was used.  ?Fall ?Pertinent negatives include no chest pain, no abdominal pain and no shortness of breath.  ? ?  ? ?Home Medications ?Prior to Admission medications   ?Medication Sig Start Date End Date Taking? Authorizing Provider  ?traMADol (ULTRAM) 50 MG tablet Take 1 tablet (50 mg total) by mouth every 6 (six) hours as needed for severe pain. 09/02/79  Yes Campbell Stall P, DO  ?albuterol (VENTOLIN HFA) 108 (90 Base) MCG/ACT inhaler Inhale 2 puffs into the lungs every 6 (six) hours as needed for wheezing or shortness of breath. 12/24/21   Kozlow, Donnamarie Poag, MD  ?cholecalciferol (VITAMIN D) 1000 units tablet Take 1,000 Units by mouth daily.    [provider]  ?DiphenhydrAMINE HCl (BENADRYL PO) Take by mouth.    [provider]  ?ELIQUIS 2.5 MG TABS tablet TAKE 1 TABLET TWICE A DAY 11/11/21   Lelon Perla, MD  ?ezetimibe (ZETIA) 10 MG tablet TAKE 1 TABLET DAILY 02/10/22   Panosh, [name], MD  ?folic acid (FOLVITE) 1 MG tablet TAKE 1 TABLET DAILY 08/22/21   Panosh, [name], MD  ?glucosamine-chondroitin 500-400 MG tablet Take 1 tablet by mouth daily.    [provider]  ?ipratropium (ATROVENT) 0.06 % nasal spray USE 1 SPRAY IN EACH NOSTRIL TWICE A DAY AS NEEDED FOR RHINITIS 03/07/22   Panosh, [name], MD  ?ipratropium-albuterol  (DUONEB) 0.5-2.5 (3) MG/3ML SOLN Take 3 mLs by nebulization every 6 (six) hours as needed. 12/24/21   Kozlow, Donnamarie Poag, MD  ?levothyroxine (SYNTHROID) 100 MCG tablet TAKE 1 TABLET EVERY MORNING ON AN EMPTY STOMACH 08/28/21   Panosh, [name], MD  ?loratadine (CLARITIN) 10 MG tablet Take 1 tablet (10 mg total) by mouth 2 (two) times daily as needed for allergies (Use an extra dose during flare ups.). 12/24/21   Kozlow, Donnamarie Poag, MD  ?Melatonin 3 MG TABS Take 3 mg by mouth at bedtime.     [provider]  ?MULTIPLE VITAMIN PO Take 1 tablet by mouth daily. 50+ Senior Vitamin Daily    [provider]  ?omeprazole (PRILOSEC) 40 MG capsule Take 1 capsule (40 mg total) by mouth daily. 12/24/21   Kozlow, Donnamarie Poag, MD  ?ondansetron (ZOFRAN) 4 MG tablet Take 1 tablet (4 mg total) by mouth every 6 (six) hours. 11/24/20   Tedd Sias, PA  ?Polyethyl Glycol-Propyl Glycol (SYSTANE OP) Place 1 drop into both eyes 2 (two) times daily. Use 1-2 Drops in both eyes    [provider]  ?rosuvastatin (CRESTOR) 5 MG tablet TAKE ONE-HALF (1/2) TABLET DAILY 07/15/21   Panosh, [name], MD  ?SALINE NASAL SPRAY NA Place 1 spray into both nostrils 2 (two) times daily as needed (congestion).     [provider]  ?triamcinolone (NASACORT) 55 MCG/ACT AERO nasal inhaler Place 1 spray into the nose daily.    [provider]  ?vitamin B-12 (CYANOCOBALAMIN) 1000 MCG tablet Take 1,000 mcg by mouth daily.    [provider]  ?   ? ?Allergies    ?Codeine, Risedronate sodium, Statins, Tape, and Amoxicillin-pot clavulanate   ? ?Review of Systems   ?Review of Systems  ?Constitutional:  Negative for chills and fever.  ?HENT:  Negative for ear pain and sore throat.   ?Eyes:  Negative for pain and visual disturbance.  ?Respiratory:  Negative for cough and shortness of breath.   ?Cardiovascular:  Negative for chest pain and palpitations.  ?Gastrointestinal:  Negative for abdominal pain and vomiting.   ?Genitourinary:  Negative for dysuria and hematuria.  ?Musculoskeletal:  Negative for arthralgias and back pain.  ?Skin:  Negative for color change and rash.  ?Neurological:  Negative for seizures and syncope.  ?All other systems reviewed and are negative. ? ?Physical Exam ?Updated Vital Signs ?BP (!) 164/83 (BP Location: Right Arm)   Pulse 82   Temp 97.8 ?F (36.6 ?C) (Oral)   Resp 17   Ht 5' (1.524 m)   Wt 48.5 kg   SpO2 95%   BMI 20.90 kg/m?  ?Physical Exam ?Vitals and nursing note reviewed.  ?Constitutional:   ?   General: She is not in acute distress. ?   Appearance: She is well-developed.  ?HENT:  ?   Head: Normocephalic and atraumatic.  ?Eyes:  ?   Conjunctiva/sclera: Conjunctivae normal.  ?Cardiovascular:  ?   Rate and Rhythm: Normal rate and regular rhythm.  ?   Heart sounds: No murmur heard. ?Pulmonary:  ?   Effort: Pulmonary effort is normal. No respiratory distress.  ?   Breath sounds: Normal breath sounds.  ?Chest:  ?   Chest wall: Tenderness present. No deformity.  ? ? ?Abdominal:  ?   Palpations: Abdomen is soft.  ?   Tenderness: There is no abdominal tenderness.  ?Musculoskeletal:     ?   General: No swelling.  ?   Right shoulder: Normal.  ?   Left shoulder: Normal.  ?   Right upper arm: Normal.  ?   Left upper arm: Normal.  ?   Right elbow: Normal.  ?   Left elbow: Normal.  ?   Right forearm: Normal.  ?   Left forearm: Normal.  ?   Right wrist: Normal.  ?   Left wrist: Normal.  ?   Cervical back: Normal and neck supple.  ?   Thoracic back: Normal.  ?   Lumbar back: Normal.  ?   Right hip: Normal.  ?   Left hip: Normal.  ?   Right upper leg: Normal.  ?   Left upper leg: Normal.  ?   Right knee: Normal.  ?   Left knee: Normal.  ?   Right lower leg: Normal.  ?   Left lower leg: Normal.  ?   Right ankle: Normal.  ?   Left ankle: Normal.  ?   Right foot: Normal.  ?   Left foot: Normal.  ?   Comments: Bruising to right upper extremity but no bony tenderness.  ?Skin: ?   General: Skin is warm and  dry.  ?   Capillary Refill: Capillary refill takes less than 2 seconds.  ?Neurological:  ?   Mental Status: She is alert and oriented to person, place, and time.  ?  GCS: GCS eye subscore is 4. GCS verbal subscore is 5. GCS motor subscore is 6.  ?Psychiatric:     ?   Mood and Affect: Mood normal.  ? ? ?ED Results / Procedures / Treatments   ?Labs ?(all labs ordered are listed, but only abnormal results are displayed) ?Labs Reviewed - No data to display ? ?EKG ?None ? ?Radiology ?CT Head Wo Contrast ? ?Result Date: 04/05/2022 ?CLINICAL DATA:  Mechanical fall in the bathroom. Fell on right side on the counter. Bruising on right side. On blood thinners (eliquis). Did not hit head or LOC. EXAM: CT HEAD WITHOUT CONTRAST CT CERVICAL SPINE WITHOUT CONTRAST TECHNIQUE: Multidetector CT imaging of the head and cervical spine was performed following the standard protocol without intravenous contrast. Multiplanar CT image reconstructions of the cervical spine were also generated. RADIATION DOSE REDUCTION: This exam was performed according to the departmental dose-optimization program which includes automated exposure control, adjustment of the mA and/or kV according to patient size and/or use of iterative reconstruction technique. COMPARISON:  11/04/2021 FINDINGS: CT HEAD FINDINGS Brain: No evidence of acute infarction, hemorrhage, hydrocephalus, extra-axial collection or mass lesion/mass effect. There is age appropriate mild ventricular sulcal enlargement. Patchy areas of white matter hypoattenuation are also noted consistent with mild chronic microvascular ischemic change. Vascular: No hyperdense vessel or unexpected calcification. Skull: Normal. Negative for fracture or focal lesion. Sinuses/Orbits: Globes and orbits are unremarkable. Visualized sinuses are clear. Other: None. CT CERVICAL SPINE FINDINGS Alignment: Straightened cervical lordosis. No spondylolisthesis. Stable appearance. Skull base and vertebrae: No acute  fracture. No primary bone lesion or focal pathologic process. Soft tissues and spinal canal: No prevertebral fluid or swelling. No visible canal hematoma. Disc levels: Moderate loss of disc height at C3-C4 C4-C5. Marked loss

## 2022-04-05 NOTE — ED Triage Notes (Signed)
Mechanical fall in the bathroom. Fell on right side on the counter. Bruising on right side. On blood thinners (eliquis). Did not hit head or LOC.  ? ?Unable to take a full breathe due to pain. Was on the ground for 1 hour before calling for help.  ? ?VS  ?124/60 Bp ?84 HR  ?97% RA ?116 BG ?

## 2022-04-15 DIAGNOSIS — Z20822 Contact with and (suspected) exposure to covid-19: Secondary | ICD-10-CM | POA: Diagnosis not present

## 2022-04-18 DIAGNOSIS — Z20822 Contact with and (suspected) exposure to covid-19: Secondary | ICD-10-CM | POA: Diagnosis not present

## 2022-05-12 ENCOUNTER — Other Ambulatory Visit: Payer: Self-pay | Admitting: Cardiology

## 2022-05-12 NOTE — Telephone Encounter (Signed)
Prescription refill request for Eliquis received. ?Indication:Afib ?Last office visit:10/22 ?Scr:0.8 ?Age: 88 ?Weight:48.5 kg ? ?Prescription refilled ? ?

## 2022-05-16 ENCOUNTER — Telehealth: Payer: Self-pay

## 2022-05-16 NOTE — Telephone Encounter (Signed)
-  patient has met $226 of $226 deductible. - estimated co-pay is $0. - prior authorization is not needed. - recent labs checked in November of 2022.  - last bone density scan done 02/28/2020.  - okay to schedule on or after 05/18/2022.   I left patient a voicemail to call the office back to schedule her prolia injection.

## 2022-05-30 DIAGNOSIS — R278 Other lack of coordination: Secondary | ICD-10-CM | POA: Diagnosis not present

## 2022-05-30 DIAGNOSIS — Z9181 History of falling: Secondary | ICD-10-CM | POA: Diagnosis not present

## 2022-05-30 DIAGNOSIS — M1990 Unspecified osteoarthritis, unspecified site: Secondary | ICD-10-CM | POA: Diagnosis not present

## 2022-05-30 DIAGNOSIS — G629 Polyneuropathy, unspecified: Secondary | ICD-10-CM | POA: Diagnosis not present

## 2022-06-03 DIAGNOSIS — M1990 Unspecified osteoarthritis, unspecified site: Secondary | ICD-10-CM | POA: Diagnosis not present

## 2022-06-03 DIAGNOSIS — G629 Polyneuropathy, unspecified: Secondary | ICD-10-CM | POA: Diagnosis not present

## 2022-06-03 DIAGNOSIS — R278 Other lack of coordination: Secondary | ICD-10-CM | POA: Diagnosis not present

## 2022-06-03 DIAGNOSIS — Z9181 History of falling: Secondary | ICD-10-CM | POA: Diagnosis not present

## 2022-06-12 DIAGNOSIS — G629 Polyneuropathy, unspecified: Secondary | ICD-10-CM | POA: Diagnosis not present

## 2022-06-12 DIAGNOSIS — R278 Other lack of coordination: Secondary | ICD-10-CM | POA: Diagnosis not present

## 2022-06-12 DIAGNOSIS — M1990 Unspecified osteoarthritis, unspecified site: Secondary | ICD-10-CM | POA: Diagnosis not present

## 2022-06-12 DIAGNOSIS — Z9181 History of falling: Secondary | ICD-10-CM | POA: Diagnosis not present

## 2022-06-19 DIAGNOSIS — G629 Polyneuropathy, unspecified: Secondary | ICD-10-CM | POA: Diagnosis not present

## 2022-06-19 DIAGNOSIS — M1990 Unspecified osteoarthritis, unspecified site: Secondary | ICD-10-CM | POA: Diagnosis not present

## 2022-06-19 DIAGNOSIS — R278 Other lack of coordination: Secondary | ICD-10-CM | POA: Diagnosis not present

## 2022-06-19 DIAGNOSIS — Z9181 History of falling: Secondary | ICD-10-CM | POA: Diagnosis not present

## 2022-06-25 ENCOUNTER — Ambulatory Visit: Payer: Medicare Other | Admitting: Internal Medicine

## 2022-07-07 DIAGNOSIS — G629 Polyneuropathy, unspecified: Secondary | ICD-10-CM | POA: Diagnosis not present

## 2022-07-07 DIAGNOSIS — M1990 Unspecified osteoarthritis, unspecified site: Secondary | ICD-10-CM | POA: Diagnosis not present

## 2022-07-07 DIAGNOSIS — Z9181 History of falling: Secondary | ICD-10-CM | POA: Diagnosis not present

## 2022-07-07 DIAGNOSIS — R278 Other lack of coordination: Secondary | ICD-10-CM | POA: Diagnosis not present

## 2022-07-09 ENCOUNTER — Other Ambulatory Visit: Payer: Self-pay | Admitting: Internal Medicine

## 2022-07-15 ENCOUNTER — Ambulatory Visit (INDEPENDENT_AMBULATORY_CARE_PROVIDER_SITE_OTHER): Payer: Medicare Other | Admitting: Internal Medicine

## 2022-07-15 ENCOUNTER — Encounter: Payer: Self-pay | Admitting: Internal Medicine

## 2022-07-15 VITALS — BP 118/64 | HR 87 | Temp 97.9°F | Ht 60.0 in | Wt 110.6 lb

## 2022-07-15 DIAGNOSIS — R2681 Unsteadiness on feet: Secondary | ICD-10-CM

## 2022-07-15 DIAGNOSIS — E538 Deficiency of other specified B group vitamins: Secondary | ICD-10-CM | POA: Diagnosis not present

## 2022-07-15 DIAGNOSIS — Z79899 Other long term (current) drug therapy: Secondary | ICD-10-CM

## 2022-07-15 DIAGNOSIS — M810 Age-related osteoporosis without current pathological fracture: Secondary | ICD-10-CM | POA: Diagnosis not present

## 2022-07-15 DIAGNOSIS — Z9181 History of falling: Secondary | ICD-10-CM | POA: Diagnosis not present

## 2022-07-15 DIAGNOSIS — Z7901 Long term (current) use of anticoagulants: Secondary | ICD-10-CM

## 2022-07-15 MED ORDER — DENOSUMAB 60 MG/ML ~~LOC~~ SOSY
60.0000 mg | PREFILLED_SYRINGE | Freq: Once | SUBCUTANEOUS | Status: AC
  Administered 2022-07-15: 60 mg via SUBCUTANEOUS

## 2022-07-15 NOTE — Patient Instructions (Signed)
Good to see  you . Continue balance  classes  .  Will have  pharmacy  care reach out to you.  Have family teach you  gps mapping   locations  .[name]  Plan labs in November  or December  . ROV in about 6 months

## 2022-07-15 NOTE — Progress Notes (Signed)
Chief Complaint  Patient presents with   Follow-up    HPI: Amy Fischer 88 y.o. come in for Chronic disease management  osteoporosis etc  On prolia and due Had fall in April  had imagine head neck and chest  no acute findings  no fracture . Going to class for balance for senior resources.  Feels to old to recover but seem to do well with this  Coming once a week.  Still playing cards  [name]  with card club.  So cognitively  doing ok . " I am just old" asks if can stop some of her meds des prescribe.... some pills hard to swallow   ROS: See pertinent positives and negatives per HPI. No bleeding cp sob arthritis is on going  using cane and walker  Past Medical History:  Diagnosis Date   Allergy    Anemia    Asymptomatic carotid artery stenosis    R ICA 40% stenosis on CTA 12/2011.   Bladder polyps    Cataract    BILATERAL-REMOVED   CHF (congestive heart failure) (HCC)    Diverticulosis    Elevated blood pressure 03/25/2011   Bp readings borderline today has hx of elvation  In office and ok at home   Not checke recently   She gfeels was elevated from anxiety     Episodic recurrent vertigo    MRI Head 2003   Esophageal spasm    GERD (gastroesophageal reflux disease)    Hepatic hemangioma    History of hepatitis    unknown type   Hyperlipidemia    Hyperplastic colon polyp    Hypothyroidism    Intestinal metaplasia of gastric mucosa    Osteoarthritis    Osteoporosis    Patellar fracture 07/13/2012   Scapular fracture 03/31/2012   small from direct blow with trip     Subclavian steal syndrome    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; CTA 2013 revealed open graft    Family History  Problem Relation Age of Onset   Hypertension Mother    Stroke Mother    Heart disease Father    Colon cancer Neg Hx    Neurofibromatosis Neg Hx    Allergic rhinitis Neg Hx    Asthma Neg Hx    Angioedema Neg Hx    Atopy Neg Hx    Eczema Neg Hx    Immunodeficiency Neg Hx     Urticaria Neg Hx     Social History   Socioeconomic History   Marital status: Widowed    Spouse name: Not on file   Number of children: 4   Years of education: Not on file   Highest education level: Bachelor's degree (e.g., BA, AB, BS)  Occupational History   Occupation: retired Pharmacist, hospital  Tobacco Use   Smoking status: Former    Packs/day: 0.75    Years: 20.00    Total pack years: 15.00    Types: Cigarettes    Quit date: 01/05/1991    Years since quitting: 31.5   Smokeless tobacco: Never  Vaping Use   Vaping Use: Never used  Substance and Sexual Activity   Alcohol use: Yes    Alcohol/week: 4.0 standard drinks of alcohol    Types: 4 Glasses of wine per week    Comment: socially   Drug use: No   Sexual activity: Not on file  Other Topics Concern   Not on file  Social History Narrative   Widowed.  Lives alone in a one story home.  Has 4 children (3 living).  Retired first [employer]    HH of 1    No pets   Former smoker   Exercises regularly   Right handed   Social Determinants of Health   Financial Resource Strain: Low Risk  (09/03/2021)   Overall Financial Resource Strain (CARDIA)    Difficulty of Paying Living Expenses: Not hard at all  Food Insecurity: No Food Insecurity (09/03/2021)   Hunger Vital Sign    Worried About Running Out of Food in the Last Year: Never true    Early in the Last Year: Never true  Transportation Needs: No Transportation Needs (09/03/2021)   PRAPARE - Hydrologist (Medical): No    Lack of Transportation (Non-Medical): No  Physical Activity: Insufficiently Active (09/03/2021)   Exercise Vital Sign    Days of Exercise per Week: 4 days    Minutes of Exercise per Session: 10 min  Stress: No Stress Concern Present (09/03/2021)   [place]    Feeling of Stress : Not at all  Social Connections: Moderately Integrated (09/03/2021)   Social  Connection and Isolation Panel [NHANES]    Frequency of Communication with Friends and Family: More than three times a week    Frequency of Social Gatherings with Friends and Family: Three times a week    Attends Religious Services: More than 4 times per year    Active Member of Clubs or Organizations: Yes    Attends Archivist Meetings: 1 to 4 times per year    Marital Status: Widowed    Outpatient Medications Prior to Visit  Medication Sig Dispense Refill   albuterol (VENTOLIN HFA) 108 (90 Base) MCG/ACT inhaler Inhale 2 puffs into the lungs every 6 (six) hours as needed for wheezing or shortness of breath. 18 g 2   cholecalciferol (VITAMIN D) 1000 units tablet Take 1,000 Units by mouth daily.     DiphenhydrAMINE HCl (BENADRYL PO) Take by mouth.     ELIQUIS 2.5 MG TABS tablet TAKE 1 TABLET TWICE A DAY 180 tablet 3   ezetimibe (ZETIA) 10 MG tablet TAKE 1 TABLET DAILY 90 tablet 1   folic acid (FOLVITE) 1 MG tablet TAKE 1 TABLET DAILY 90 tablet 3   glucosamine-chondroitin 500-400 MG tablet Take 1 tablet by mouth daily.     ipratropium (ATROVENT) 0.06 % nasal spray USE 1 SPRAY IN EACH NOSTRIL TWICE A DAY AS NEEDED FOR RHINITIS 45 mL 3   ipratropium-albuterol (DUONEB) 0.5-2.5 (3) MG/3ML SOLN Take 3 mLs by nebulization every 6 (six) hours as needed. 150 mL 2   levothyroxine (SYNTHROID) 100 MCG tablet TAKE 1 TABLET EVERY MORNING ON AN EMPTY STOMACH 90 tablet 3   loratadine (CLARITIN) 10 MG tablet Take 1 tablet (10 mg total) by mouth 2 (two) times daily as needed for allergies (Use an extra dose during flare ups.). 60 tablet 11   Melatonin 3 MG TABS Take 3 mg by mouth at bedtime.      MULTIPLE VITAMIN PO Take 1 tablet by mouth daily. 50+ Senior Vitamin Daily     omeprazole (PRILOSEC) 40 MG capsule Take 1 capsule (40 mg total) by mouth daily. 90 capsule 4   ondansetron (ZOFRAN) 4 MG tablet Take 1 tablet (4 mg total) by mouth every 6 (six) hours. 12 tablet 0   Polyethyl Glycol-Propyl  Glycol (SYSTANE OP)  Place 1 drop into both eyes 2 (two) times daily. Use 1-2 Drops in both eyes     rosuvastatin (CRESTOR) 5 MG tablet TAKE ONE-HALF (1/2) TABLET DAILY 90 tablet 3   SALINE NASAL SPRAY NA Place 1 spray into both nostrils 2 (two) times daily as needed (congestion).      traMADol (ULTRAM) 50 MG tablet Take 1 tablet (50 mg total) by mouth every 6 (six) hours as needed for up to 15 doses for severe pain. 15 tablet 0   triamcinolone (NASACORT) 55 MCG/ACT AERO nasal inhaler Place 1 spray into the nose daily.     vitamin B-12 (CYANOCOBALAMIN) 1000 MCG tablet Take 1,000 mcg by mouth daily.     No facility-administered medications prior to visit.     EXAM:  BP 118/64 (BP Location: Left Arm, Patient Position: Sitting, Cuff Size: Normal)   Pulse 87   Temp 97.9 F (36.6 C) (Oral)   Ht 5' (1.524 m)   Wt 110 lb 9.6 oz (50.2 kg)   SpO2 94%   BMI 21.60 kg/m   Body mass index is 21.6 kg/m.  GENERAL: vitals reviewed and listed above, alert, oriented, appears well hydrated and in no acute distress HEENT: atraumatic, conjunctiva  clear, no obvious abnormalities on inspection of external nose and ears  NECK: no obvious masses on inspection palpation  LUNGS: clear to auscultation bilaterally, no wheezes, rales or rhonchi, good air movement CV: HRRR, no clubbing cyanosis or  peripheral edema nl cap refill  MS: moves all extremities  arthritis changes  Gait with cane  slow and directed  not wide based  PSYCH: pleasant and cooperative, no obvious depression or anxiety Lab Results  Component Value Date   WBC 6.9 11/11/2021   HGB 13.9 11/11/2021   HCT 41.9 11/11/2021   PLT 259.0 11/11/2021   GLUCOSE 80 11/11/2021   CHOL 158 01/28/2021   TRIG 100.0 01/28/2021   HDL 65.50 01/28/2021   LDLDIRECT 76.0 08/19/2019   LDLCALC 73 01/28/2021   ALT 14 08/26/2021   AST 18 08/26/2021   NA 140 11/11/2021   K 4.5 11/11/2021   CL 105 11/11/2021   CREATININE 0.84 11/11/2021   BUN 19  11/11/2021   CO2 27 11/11/2021   TSH 4.10 11/11/2021   INR 1.02 01/12/2016   HGBA1C 5.1 10/14/2016   BP Readings from Last 3 Encounters:  07/15/22 118/64  04/05/22 (!) 148/72  12/25/21 124/78    ASSESSMENT AND PLAN:  Discussed the following assessment and plan:  Medication management - Plan: AMB Referral to [facility]  Osteoporosis, unspecified osteoporosis type, unspecified pathological fracture presence - Plan: denosumab (PROLIA) injection 60 mg, AMB Referral to [facility]ordin[facility]  History of fall - Plan: AMB Referral to [facility]  Low vitamin B12 level - Plan: AMB Referral to [place]  Unsteadiness  Anticoagulant long-term use - Plan: AMB Referral to [place]  about medication  and pill sizes   I think would benefit from [facility] pharmacy  options for vits etc.  Not sure glucosamine chondroitin is needed  but prob  stay on vits.  -Patient advised to return or notify health care team  if  new concerns arise. Review  counsel and referral 34 minutes  Patient Instructions  Good to see  you . Continue balance  classes  .  Will have  pharmacy  care reach out to you.  Have family teach you  gps mapping   locations  .[name]  Plan labs in November  or December  . ROV in about 6 months   [name]. Yuriy Cui M.D.

## 2022-07-16 DIAGNOSIS — Z9181 History of falling: Secondary | ICD-10-CM | POA: Diagnosis not present

## 2022-07-16 DIAGNOSIS — M1990 Unspecified osteoarthritis, unspecified site: Secondary | ICD-10-CM | POA: Diagnosis not present

## 2022-07-16 DIAGNOSIS — R278 Other lack of coordination: Secondary | ICD-10-CM | POA: Diagnosis not present

## 2022-07-16 DIAGNOSIS — G629 Polyneuropathy, unspecified: Secondary | ICD-10-CM | POA: Diagnosis not present

## 2022-07-22 DIAGNOSIS — Z9181 History of falling: Secondary | ICD-10-CM | POA: Diagnosis not present

## 2022-07-22 DIAGNOSIS — M1990 Unspecified osteoarthritis, unspecified site: Secondary | ICD-10-CM | POA: Diagnosis not present

## 2022-07-22 DIAGNOSIS — R278 Other lack of coordination: Secondary | ICD-10-CM | POA: Diagnosis not present

## 2022-07-22 DIAGNOSIS — G629 Polyneuropathy, unspecified: Secondary | ICD-10-CM | POA: Diagnosis not present

## 2022-07-24 MED ORDER — DENOSUMAB 60 MG/ML ~~LOC~~ SOSY
60.0000 mg | PREFILLED_SYRINGE | Freq: Once | SUBCUTANEOUS | Status: AC
  Administered 2022-07-15: 60 mg via SUBCUTANEOUS

## 2022-07-24 NOTE — Addendum Note (Signed)
Addended by: Gerilyn Nestle on: 07/24/2022 01:29 PM   Modules accepted: Orders

## 2022-07-29 DIAGNOSIS — R278 Other lack of coordination: Secondary | ICD-10-CM | POA: Diagnosis not present

## 2022-07-29 DIAGNOSIS — Z9181 History of falling: Secondary | ICD-10-CM | POA: Diagnosis not present

## 2022-07-29 DIAGNOSIS — G629 Polyneuropathy, unspecified: Secondary | ICD-10-CM | POA: Diagnosis not present

## 2022-07-29 DIAGNOSIS — M1990 Unspecified osteoarthritis, unspecified site: Secondary | ICD-10-CM | POA: Diagnosis not present

## 2022-08-05 DIAGNOSIS — R278 Other lack of coordination: Secondary | ICD-10-CM | POA: Diagnosis not present

## 2022-08-05 DIAGNOSIS — M1990 Unspecified osteoarthritis, unspecified site: Secondary | ICD-10-CM | POA: Diagnosis not present

## 2022-08-05 DIAGNOSIS — Z9181 History of falling: Secondary | ICD-10-CM | POA: Diagnosis not present

## 2022-08-05 DIAGNOSIS — G629 Polyneuropathy, unspecified: Secondary | ICD-10-CM | POA: Diagnosis not present

## 2022-08-11 ENCOUNTER — Other Ambulatory Visit: Payer: Self-pay | Admitting: Internal Medicine

## 2022-08-12 DIAGNOSIS — G629 Polyneuropathy, unspecified: Secondary | ICD-10-CM | POA: Diagnosis not present

## 2022-08-12 DIAGNOSIS — R278 Other lack of coordination: Secondary | ICD-10-CM | POA: Diagnosis not present

## 2022-08-12 DIAGNOSIS — Z9181 History of falling: Secondary | ICD-10-CM | POA: Diagnosis not present

## 2022-08-12 DIAGNOSIS — M1990 Unspecified osteoarthritis, unspecified site: Secondary | ICD-10-CM | POA: Diagnosis not present

## 2022-08-18 ENCOUNTER — Other Ambulatory Visit: Payer: Self-pay | Admitting: Internal Medicine

## 2022-08-18 DIAGNOSIS — Z9181 History of falling: Secondary | ICD-10-CM | POA: Diagnosis not present

## 2022-08-18 DIAGNOSIS — M1990 Unspecified osteoarthritis, unspecified site: Secondary | ICD-10-CM | POA: Diagnosis not present

## 2022-08-18 DIAGNOSIS — R278 Other lack of coordination: Secondary | ICD-10-CM | POA: Diagnosis not present

## 2022-08-18 DIAGNOSIS — G629 Polyneuropathy, unspecified: Secondary | ICD-10-CM | POA: Diagnosis not present

## 2022-08-25 ENCOUNTER — Other Ambulatory Visit: Payer: Self-pay | Admitting: Internal Medicine

## 2022-08-26 DIAGNOSIS — G629 Polyneuropathy, unspecified: Secondary | ICD-10-CM | POA: Diagnosis not present

## 2022-08-26 DIAGNOSIS — Z9181 History of falling: Secondary | ICD-10-CM | POA: Diagnosis not present

## 2022-08-26 DIAGNOSIS — M1990 Unspecified osteoarthritis, unspecified site: Secondary | ICD-10-CM | POA: Diagnosis not present

## 2022-08-26 DIAGNOSIS — R278 Other lack of coordination: Secondary | ICD-10-CM | POA: Diagnosis not present

## 2022-09-02 DIAGNOSIS — M1990 Unspecified osteoarthritis, unspecified site: Secondary | ICD-10-CM | POA: Diagnosis not present

## 2022-09-02 DIAGNOSIS — R278 Other lack of coordination: Secondary | ICD-10-CM | POA: Diagnosis not present

## 2022-09-02 DIAGNOSIS — G629 Polyneuropathy, unspecified: Secondary | ICD-10-CM | POA: Diagnosis not present

## 2022-09-02 DIAGNOSIS — Z9181 History of falling: Secondary | ICD-10-CM | POA: Diagnosis not present

## 2022-09-04 ENCOUNTER — Telehealth: Payer: Self-pay | Admitting: Internal Medicine

## 2022-09-04 NOTE — Progress Notes (Signed)
  Care Management   Follow Up Note   09/04/2022 Name: CERISSA ZEIGER MRN: [id number] DOB: 02-18-1929   Referred by: Burnis Medin, MD Reason for referral : No chief complaint on file.   An unsuccessful telephone outreach was attempted today. The patient was referred to the case management team for assistance with care management and care coordination.   Follow Up Plan: The care management team will reach out to the patient again over the next 1 days.   [place]

## 2022-09-05 ENCOUNTER — Telehealth: Payer: Self-pay | Admitting: Internal Medicine

## 2022-09-05 NOTE — Progress Notes (Signed)
  Care Management   Follow Up Note   09/05/2022 Name: DEANNE BEDGOOD MRN: [id number] DOB: [birth year]   Referred by: Burnis Medin, MD Reason for referral : No chief complaint on file.   A second unsuccessful telephone outreach was attempted today. The patient was referred to the case management team for assistance with care management and care coordination.   Follow Up Plan: The care management team will reach out to the patient again over the next 3 days.   [place]

## 2022-09-08 ENCOUNTER — Telehealth: Payer: Self-pay | Admitting: Internal Medicine

## 2022-09-08 NOTE — Progress Notes (Signed)
  Care Management   Follow Up Note   09/08/2022 Name: Amy Fischer MRN: [id number] DOB: [birth year]   Referred by: Burnis Medin, MD Reason for referral : No chief complaint on file.   Third unsuccessful telephone outreach was attempted today. The patient was referred to the case management team for assistance with care management and care coordination. The patient's primary care provider has been notified of our unsuccessful attempts to make or maintain contact with the patient. The care management team is pleased to engage with this patient at any time in the future should he/she be interested in assistance from the care management team.   Follow Up Plan: No further follow up required:    [place]

## 2022-09-09 DIAGNOSIS — R278 Other lack of coordination: Secondary | ICD-10-CM | POA: Diagnosis not present

## 2022-09-09 DIAGNOSIS — Z9181 History of falling: Secondary | ICD-10-CM | POA: Diagnosis not present

## 2022-09-09 DIAGNOSIS — M1990 Unspecified osteoarthritis, unspecified site: Secondary | ICD-10-CM | POA: Diagnosis not present

## 2022-09-09 DIAGNOSIS — G629 Polyneuropathy, unspecified: Secondary | ICD-10-CM | POA: Diagnosis not present

## 2022-09-16 ENCOUNTER — Ambulatory Visit (INDEPENDENT_AMBULATORY_CARE_PROVIDER_SITE_OTHER): Payer: Medicare Other

## 2022-09-16 VITALS — Ht 61.0 in | Wt 112.0 lb

## 2022-09-16 DIAGNOSIS — Z0000 Encounter for general adult medical examination without abnormal findings: Secondary | ICD-10-CM | POA: Diagnosis not present

## 2022-09-16 DIAGNOSIS — R278 Other lack of coordination: Secondary | ICD-10-CM | POA: Diagnosis not present

## 2022-09-16 DIAGNOSIS — G629 Polyneuropathy, unspecified: Secondary | ICD-10-CM | POA: Diagnosis not present

## 2022-09-16 DIAGNOSIS — M1990 Unspecified osteoarthritis, unspecified site: Secondary | ICD-10-CM | POA: Diagnosis not present

## 2022-09-16 DIAGNOSIS — Z9181 History of falling: Secondary | ICD-10-CM | POA: Diagnosis not present

## 2022-09-16 NOTE — Progress Notes (Signed)
I connected with Amy Fischer today by telephone and verified that I am speaking with the correct person using two identifiers. Location patient: home Location provider: work Persons participating in the virtual visit: Amy Fischer, Amy Durand LPN.   I discussed the limitations, risks, security and privacy concerns of performing an evaluation and management service by telephone and the availability of in person appointments. I also discussed with the patient that there may be a patient responsible charge related to this service. The patient expressed understanding and verbally consented to this telephonic visit.    Interactive audio and video telecommunications were attempted between this provider and patient, however failed, due to patient having technical difficulties OR patient did not have access to video capability.  We continued and completed visit with audio only.     Vital signs may be patient reported or missing.  Subjective:   Amy Fischer is a 88 y.o. female who presents for Medicare Annual (Subsequent) preventive examination.  Review of Systems     Cardiac Risk Factors include: advanced age (>4mn, >>87women);hypertension     Objective:    Today's Vitals   09/16/22 1516  Weight: 112 lb (50.8 kg)  Height: '5\' 1"'$  (1.549 m)   Body mass index is 21.16 kg/m.     09/16/2022    3:27 PM 04/05/2022   11:34 AM 11/04/2021    5:13 PM 09/03/2021    3:29 PM 11/24/2020    2:13 PM 08/29/2020    3:19 PM 08/24/2020    1:29 PM  Advanced Directives  Does Patient Have a Medical Advance Directive? Yes Yes No Yes No Yes No  Type of [place] Living will  [place]     Does patient want to make changes to medical advance directive?      Yes (MAU/Ambulatory/Procedural Areas - Information given)   Copy of [place]? No - copy requested   No - copy requested     Would patient  like information on creating a medical advance directive?   No - Patient declined        Current Medications (verified) Outpatient Encounter Medications as of 09/16/2022  Medication Sig   albuterol (VENTOLIN HFA) 108 (90 Base) MCG/ACT inhaler Inhale 2 puffs into the lungs every 6 (six) hours as needed for wheezing or shortness of breath.   cholecalciferol (VITAMIN D) 1000 units tablet Take 1,000 Units by mouth daily.   DiphenhydrAMINE HCl (BENADRYL PO) Take by mouth.   ELIQUIS 2.5 MG TABS tablet TAKE 1 TABLET TWICE A DAY   ezetimibe (ZETIA) 10 MG tablet TAKE 1 TABLET DAILY   folic acid (FOLVITE) 1 MG tablet TAKE 1 TABLET DAILY   ipratropium (ATROVENT) 0.06 % nasal spray USE 1 SPRAY IN EACH NOSTRIL TWICE A DAY AS NEEDED FOR RHINITIS   levothyroxine (SYNTHROID) 100 MCG tablet TAKE 1 TABLET EVERY MORNING ON AN EMPTY STOMACH   loratadine (CLARITIN) 10 MG tablet Take 1 tablet (10 mg total) by mouth 2 (two) times daily as needed for allergies (Use an extra dose during flare ups.).   Melatonin 3 MG TABS Take 3 mg by mouth at bedtime.    MULTIPLE VITAMIN PO Take 1 tablet by mouth daily. 50+ Senior Vitamin Daily   omeprazole (PRILOSEC) 40 MG capsule Take 1 capsule (40 mg total) by mouth daily.   Polyethyl Glycol-Propyl Glycol (SYSTANE OP) Place 1 drop into both eyes 2 (two) times daily.  Use 1-2 Drops in both eyes   rosuvastatin (CRESTOR) 5 MG tablet TAKE ONE-HALF (1/2) TABLET DAILY   SALINE NASAL SPRAY NA Place 1 spray into both nostrils 2 (two) times daily as needed (congestion).    triamcinolone (NASACORT) 55 MCG/ACT AERO nasal inhaler Place 1 spray into the nose daily.   vitamin B-12 (CYANOCOBALAMIN) 1000 MCG tablet Take 1,000 mcg by mouth daily.   glucosamine-chondroitin 500-400 MG tablet Take 1 tablet by mouth daily. (Patient not taking: Reported on 09/16/2022)   ipratropium-albuterol (DUONEB) 0.5-2.5 (3) MG/3ML SOLN Take 3 mLs by nebulization every 6 (six) hours as needed. (Patient not taking:  Reported on 09/16/2022)   ondansetron (ZOFRAN) 4 MG tablet Take 1 tablet (4 mg total) by mouth every 6 (six) hours. (Patient not taking: Reported on 09/16/2022)   traMADol (ULTRAM) 50 MG tablet Take 1 tablet (50 mg total) by mouth every 6 (six) hours as needed for up to 15 doses for severe pain. (Patient not taking: Reported on 09/16/2022)   No facility-administered encounter medications on file as of 09/16/2022.    Allergies (verified) Codeine, Risedronate sodium, Statins, Tape, and Amoxicillin-pot clavulanate   History: Past Medical History:  Diagnosis Date   Allergy    Anemia    Asymptomatic carotid artery stenosis    R ICA 40% stenosis on CTA 12/2011.   Bladder polyps    Cataract    BILATERAL-REMOVED   CHF (congestive heart failure) (HCC)    Diverticulosis    Elevated blood pressure 03/25/2011   Bp readings borderline today has hx of elvation  In office and ok at home   Not checke recently   She gfeels was elevated from anxiety     Episodic recurrent vertigo    MRI Head 2003   Esophageal spasm    GERD (gastroesophageal reflux disease)    Hepatic hemangioma    History of hepatitis    unknown type   Hyperlipidemia    Hyperplastic colon polyp    Hypothyroidism    Intestinal metaplasia of gastric mucosa    Osteoarthritis    Osteoporosis    Patellar fracture 07/13/2012   Scapular fracture 03/31/2012   small from direct blow with trip     Subclavian steal syndrome    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; CTA 2013 revealed open graft   Past Surgical History:  Procedure Laterality Date   APPENDECTOMY     CARDIAC CATHETERIZATION N/A 10/04/2015   Procedure: Right/Left Heart Cath and Coronary Angiography;  Surgeon: Belva Crome, MD;  Location: [facility];  Service: Cardiovascular;  Laterality: N/A;   CAROTID-SUBCLAVIAN BYPASS GRAFT Left 1999   for [place] steal syndrome   CHOLECYSTECTOMY     COLONOSCOPY     CYSTECTOMY Left    hand   ELBOW SURGERY Left    KNEE SURGERY  Left 2014   MITRAL VALVE REPAIR N/A 10/10/2015   Procedure: MITRAL VALVE REPAIR (MVR) WITH SIZE 28 CARPENTIER-EDWARDS PHYSIO II ANNULOPLASTY RING;  Surgeon: [name], MD;  Location: [facility];  Service: Open Heart Surgery;  Laterality: N/A;   ROTATOR CUFF REPAIR Left    TEAR DUCT PROBING  07/2014   TEE WITHOUT CARDIOVERSION N/A 10/03/2015   Procedure: TRANSESOPHAGEAL ECHOCARDIOGRAM (TEE);  Surgeon: Larey Dresser, MD;  Location: [place] [facility];  Service: Cardiovascular;  Laterality: N/A;   TEE WITHOUT CARDIOVERSION N/A 10/10/2015   Procedure: TRANSESOPHAGEAL ECHOCARDIOGRAM (TEE);  Surgeon: [name], MD;  Location: [facility];  Service: Open  Heart Surgery;  Laterality: N/A;   TONSILLECTOMY     TUBAL LIGATION     Family History  Problem Relation Age of Onset   Hypertension Mother    Stroke Mother    Heart disease Father    Colon cancer Neg Hx    Neurofibromatosis Neg Hx    Allergic rhinitis Neg Hx    Asthma Neg Hx    Angioedema Neg Hx    Atopy Neg Hx    Eczema Neg Hx    Immunodeficiency Neg Hx    Urticaria Neg Hx    Social History   Socioeconomic History   Marital status: Widowed    Spouse name: Not on file   Number of children: 4   Years of education: Not on file   Highest education level: Bachelor's degree (e.g., BA, AB, BS)  Occupational History   Occupation: retired Pharmacist, hospital  Tobacco Use   Smoking status: Former    Packs/day: 0.75    Years: 20.00    Total pack years: 15.00    Types: Cigarettes    Quit date: 01/05/1991    Years since quitting: 31.7   Smokeless tobacco: Never  Vaping Use   Vaping Use: Never used  Substance and Sexual Activity   Alcohol use: Not Currently    Alcohol/week: 4.0 standard drinks of alcohol    Types: 4 Glasses of wine per week    Comment: socially   Drug use: No   Sexual activity: Not on file  Other Topics Concern   Not on file  Social History Narrative   Widowed.  Lives alone in a one story home.  Has 4 children (3  living).  Retired first [employer].    HH of 1    No pets   Former smoker   Exercises regularly   Right handed   Social Determinants of Health   Financial Resource Strain: Low Risk  (09/16/2022)   Overall Financial Resource Strain (CARDIA)    Difficulty of Paying Living Expenses: Not hard at all  Food Insecurity: No Food Insecurity (09/16/2022)   Hunger Vital Sign    Worried About Running Out of Food in the Last Year: Never true    [place] in the Last Year: Never true  Transportation Needs: No Transportation Needs (09/16/2022)   PRAPARE - Hydrologist (Medical): No    Lack of Transportation (Non-Medical): No  Physical Activity: Sufficiently Active (09/16/2022)   Exercise Vital Sign    Days of Exercise per Week: 7 days    Minutes of Exercise per Session: 30 min  Stress: No Stress Concern Present (09/16/2022)   Tea    Feeling of Stress : Not at all  Social Connections: Moderately Integrated (09/03/2021)   Social Connection and Isolation Panel [NHANES]    Frequency of Communication with Friends and Family: More than three times a week    Frequency of Social Gatherings with Friends and Family: Three times a week    Attends Religious Services: More than 4 times per year    Active Member of Clubs or Organizations: Yes    Attends Archivist Meetings: 1 to 4 times per year    Marital Status: Widowed    Tobacco Counseling Counseling given: Not Answered   Clinical Intake:  Pre-visit preparation completed: Yes  Pain : No/denies pain     Nutritional Status: BMI of 19-24  Normal Nutritional Risks: None  Diabetes: No  How often do you need to have someone help you when you read instructions, pamphlets, or other written materials from your doctor or pharmacy?: 1 - Never  Diabetic? no  Interpreter Needed?: No  Information entered by :: NAllen LPN   Activities  of Daily Living    09/16/2022    3:29 PM  In your present state of health, do you have any difficulty performing the following activities:  Hearing? 1  Vision? 1  Comment trouble seeing at night, double vision but clear with glasses  Difficulty concentrating or making decisions? 1  Walking or climbing stairs? 1  Dressing or bathing? 0  Doing errands, shopping? 0  Preparing Food and eating ? N  Using the Toilet? N  In the past six months, have you accidently leaked urine? Y  Do you have problems with loss of bowel control? N  Managing your Medications? N  Managing your Finances? N  Housekeeping or managing your Housekeeping? N    Patient Care Team: Panosh, [name], MD as PCP - General Stanford Breed, Denice Bors, MD as PCP - Cardiology (Cardiology) Newton Pigg, MD (Obstetrics and Gynecology) Paralee Cancel, MD (Orthopedic Surgery) Jacelyn Pi, MD as Attending Physician (Endocrinology) Everitt Amber, MD as Consulting Physician (Ophthalmology) Stanford Breed Denice Bors, MD as Consulting Physician (Cardiology) [name], MD as Consulting Physician (Cardiothoracic Surgery) Gala Romney, Delena Serve, DO as Consulting Physician (Neurology) Alda Berthold, DO as Consulting Physician (Neurology)  Indicate any recent Medical Services you may have received from other than [facility] providers in the past year (date may be approximate).     Assessment:   This is a routine wellness examination for Amy Fischer.  Hearing/Vision screen Vision Screening - Comments:: Regular eye exams, Dr. Gershon Crane  Dietary issues and exercise activities discussed: Current Exercise Habits: Home exercise routine, Type of exercise: calisthenics, Time (Minutes): 30, Frequency (Times/Week): 7, Weekly Exercise (Minutes/Week): 210   Goals Addressed             This Visit's Progress    Patient Stated       09/16/2022, stay alive       Depression Screen    09/16/2022    3:29 PM 07/15/2022    1:29 PM 12/25/2021    10:51 AM 12/03/2021    4:16 PM 09/03/2021    3:26 PM 08/29/2020    3:16 PM 02/24/2020   10:37 AM  PHQ 2/9 Scores  PHQ - 2 Score 0 0 1 3 0 0 0  PHQ- 9 Score  '4 2 11       '$ Fall Risk    09/16/2022    3:27 PM 07/15/2022    1:29 PM 12/25/2021   10:50 AM 12/03/2021    4:16 PM 09/03/2021    3:30 PM  [place] in the past year? '1  1 1 '$ 0  Comment lost balance      Number falls in past yr: 1 1 0 0 0  Injury with Fall? 0 0 0  0  Risk for fall due to : Impaired balance/gait;Impaired mobility;Medication side effect Other (Comment)   Impaired vision;Impaired balance/gait  Follow up Falls evaluation completed;Education provided;Falls prevention discussed Falls prevention discussed   Falls prevention discussed    FALL RISK PREVENTION PERTAINING TO THE HOME:  Any stairs in or around the home? No  If so, are there any without handrails? N/a Home free of loose throw rugs in walkways, pet beds, electrical cords, etc? Yes  Adequate lighting in your home to reduce risk of falls? Yes   ASSISTIVE DEVICES UTILIZED TO PREVENT FALLS:  Life alert? Yes  Use of a cane, walker or w/c? Yes  Grab bars in the bathroom? Yes  Shower chair or bench in shower? Yes  Elevated toilet seat or a handicapped toilet? Yes   TIMED UP AND GO:  Was the test performed? No .      Cognitive Function:        09/16/2022    3:31 PM 09/03/2021    3:33 PM 08/29/2020    3:26 PM  6CIT Screen  What Year? 0 points 0 points 0 points  What month? 0 points 0 points 0 points  What time? 0 points 0 points   Count back from 20 0 points 0 points 0 points  Months in reverse 0 points 0 points 0 points  Repeat phrase 0 points 0 points 0 points  Total Score 0 points 0 points     Immunizations Immunization History  Administered Date(s) Administered   Influenza Split 10/20/2013   Influenza Whole 12/29/2001, 11/09/2007, 09/19/2008, 09/24/2010   Influenza, High Dose Seasonal PF 10/31/2015, 10/06/2016, 09/29/2017, 09/30/2018    Influenza-Unspecified 09/28/2014, 10/02/2020   PFIZER(Purple Top)SARS-COV-2 Vaccination 01/22/2020, 02/13/2020, 10/19/2020   Pneumococcal Conjugate-13 07/26/2014   Pneumococcal Polysaccharide-23 09/18/2009   Td 04/09/1999   Tdap 09/23/2011   Zoster Recombinat (Shingrix) 09/17/2018, 02/02/2019   Zoster, Live 12/29/2010    TDAP status: Due, Education has been provided regarding the importance of this vaccine. Advised may receive this vaccine at local pharmacy or Health Dept. Aware to provide a copy of the vaccination record if obtained from local pharmacy or Health Dept. Verbalized acceptance and understanding.  Flu Vaccine status: Due, Education has been provided regarding the importance of this vaccine. Advised may receive this vaccine at local pharmacy or Health Dept. Aware to provide a copy of the vaccination record if obtained from local pharmacy or Health Dept. Verbalized acceptance and understanding.  Pneumococcal vaccine status: Up to date  Covid-19 vaccine status: Completed vaccines  Qualifies for Shingles Vaccine? Yes   Zostavax completed Yes   Shingrix Completed?: Yes  Screening Tests Health Maintenance  Topic Date Due   TETANUS/TDAP  09/22/2021   INFLUENZA VACCINE  07/29/2022   COVID-19 Vaccine (4 - Pfizer risk series) 01/15/2023 (Originally 12/14/2020)   Pneumonia Vaccine 57+ Years old  Completed   DEXA SCAN  Completed   Zoster Vaccines- Shingrix  Completed   HPV VACCINES  Aged Out    Health Maintenance  Health Maintenance Due  Topic Date Due   TETANUS/TDAP  09/22/2021   INFLUENZA VACCINE  07/29/2022    Colorectal cancer screening: No longer required.   Mammogram status: No longer required due to age.  Bone Density status: Completed 02/28/2020.   Lung Cancer Screening: (Low Dose CT Chest recommended if Age 49-80 years, 30 pack-year currently smoking OR have quit w/in 15years.) does not qualify.   Lung Cancer Screening Referral: no  Additional  Screening:  Hepatitis C Screening: does not qualify;  Vision Screening: Recommended annual ophthalmology exams for early detection of glaucoma and other disorders of the eye. Is the patient up to date with their annual eye exam?  Yes  Who is the provider or what is the name of the office in which the patient attends annual eye exams? Dr. Gershon Crane If pt is not established with a provider, would they like to be referred to a provider to establish care? No .  Dental Screening: Recommended annual dental exams for proper oral hygiene  Community Resource Referral / Chronic Care Management: CRR required this visit?  No   CCM required this visit?  No      Plan:     I have personally reviewed and noted the following in the patient's chart:   Medical and social history Use of alcohol, tobacco or illicit drugs  Current medications and supplements including opioid prescriptions. Patient is not currently taking opioid prescriptions. Functional ability and status Nutritional status Physical activity Advanced directives List of other physicians Hospitalizations, surgeries, and ER visits in previous 12 months Vitals Screenings to include cognitive, depression, and falls Referrals and appointments  In addition, I have reviewed and discussed with patient certain preventive protocols, quality metrics, and best practice recommendations. A written personalized care plan for preventive services as well as general preventive health recommendations were provided to patient.     Amy Simmering, LPN   03/12/9457   Nurse Notes: none  Due to this being a virtual visit, the after visit summary with patients personalized plan was offered to patient via mail or my-chart.  to pick up at office at next visit

## 2022-09-16 NOTE — Patient Instructions (Signed)
Amy Fischer , Thank you for taking time to come for your Medicare Wellness Visit. I appreciate your ongoing commitment to your health goals. Please review the following plan we discussed and let me know if I can assist you in the future.   Screening recommendations/referrals: Colonoscopy: not required Mammogram: not required Bone Density: completed 02/28/2020 Recommended yearly ophthalmology/optometry visit for glaucoma screening and checkup Recommended yearly dental visit for hygiene and checkup  Vaccinations: Influenza vaccine: due Pneumococcal vaccine: completed 07/26/2014 Tdap vaccine: due Shingles vaccine: completed   Covid-19: 10/19/2020, 02/13/2020, 01/22/2020  Advanced directives: Please bring a copy of your POA (Power of Attorney) and/or Living Will to your next appointment.   Conditions/risks identified: none  Next appointment: Follow up in one year for your annual wellness visit    Preventive Care 65 Years and Older, Female Preventive care refers to lifestyle choices and visits with your health care provider that can promote health and wellness. What does preventive care include? A yearly physical exam. This is also called an annual well check. Dental exams once or twice a year. Routine eye exams. Ask your health care provider how often you should have your eyes checked. Personal lifestyle choices, including: Daily care of your teeth and gums. Regular physical activity. Eating a healthy diet. Avoiding tobacco and drug use. Limiting alcohol use. Practicing safe sex. Taking low-dose aspirin every day. Taking vitamin and mineral supplements as recommended by your health care provider. What happens during an annual well check? The services and screenings done by your health care provider during your annual well check will depend on your age, overall health, lifestyle risk factors, and family history of disease. Counseling  Your health care provider may ask you questions  about your: Alcohol use. Tobacco use. Drug use. Emotional well-being. Home and relationship well-being. Sexual activity. Eating habits. History of falls. Memory and ability to understand (cognition). Work and work Statistician. Reproductive health. Screening  You may have the following tests or measurements: Height, weight, and BMI. Blood pressure. Lipid and cholesterol levels. These may be checked every 5 years, or more frequently if you are over 49 years old. Skin check. Lung cancer screening. You may have this screening every year starting at age 62 if you have a 30-pack-year history of smoking and currently smoke or have quit within the past 15 years. Fecal occult blood test (FOBT) of the stool. You may have this test every year starting at age 10. Flexible sigmoidoscopy or colonoscopy. You may have a sigmoidoscopy every 5 years or a colonoscopy every 10 years starting at age 61. Hepatitis C blood test. Hepatitis B blood test. Sexually transmitted disease (STD) testing. Diabetes screening. This is done by checking your blood sugar (glucose) after you have not eaten for a while (fasting). You may have this done every 1-3 years. Bone density scan. This is done to screen for osteoporosis. You may have this done starting at age 2. Mammogram. This may be done every 1-2 years. Talk to your health care provider about how often you should have regular mammograms. Talk with your health care provider about your test results, treatment options, and if necessary, the need for more tests. Vaccines  Your health care provider may recommend certain vaccines, such as: Influenza vaccine. This is recommended every year. Tetanus, diphtheria, and acellular pertussis (Tdap, Td) vaccine. You may need a Td booster every 10 years. Zoster vaccine. You may need this after age 29. Pneumococcal 13-valent conjugate (PCV13) vaccine. One dose is recommended after age  65. Pneumococcal polysaccharide (PPSV23)  vaccine. One dose is recommended after age 39. Talk to your health care provider about which screenings and vaccines you need and how often you need them. This information is not intended to replace advice given to you by your health care provider. Make sure you discuss any questions you have with your health care provider. Document Released: 01/11/2016 Document Revised: 09/03/2016 Document Reviewed: 10/16/2015 Elsevier Interactive Patient Education  2017 [employer] Prevention in the Home Falls can cause injuries. They can happen to people of all ages. There are many things you can do to make your home safe and to help prevent falls. What can I do on the outside of my home? Regularly fix the edges of walkways and driveways and fix any cracks. Remove anything that might make you trip as you walk through a door, such as a raised step or threshold. Trim any bushes or trees on the path to your home. Use bright outdoor lighting. Clear any walking paths of anything that might make someone trip, such as rocks or tools. Regularly check to see if handrails are loose or broken. Make sure that both sides of any steps have handrails. Any raised decks and porches should have guardrails on the edges. Have any leaves, snow, or ice cleared regularly. Use sand or salt on walking paths during winter. Clean up any spills in your garage right away. This includes oil or grease spills. What can I do in the bathroom? Use night lights. Install grab bars by the toilet and in the tub and shower. Do not use towel bars as grab bars. Use non-skid mats or decals in the tub or shower. If you need to sit down in the shower, use a plastic, non-slip stool. Keep the floor dry. Clean up any water that spills on the floor as soon as it happens. Remove soap buildup in the tub or shower regularly. Attach bath mats securely with double-sided non-slip rug tape. Do not have throw rugs and other things on the floor that  can make you trip. What can I do in the bedroom? Use night lights. Make sure that you have a light by your bed that is easy to reach. Do not use any sheets or blankets that are too big for your bed. They should not hang down onto the floor. Have a firm chair that has side arms. You can use this for support while you get dressed. Do not have throw rugs and other things on the floor that can make you trip. What can I do in the kitchen? Clean up any spills right away. Avoid walking on wet floors. Keep items that you use a lot in easy-to-reach places. If you need to reach something above you, use a strong step stool that has a grab bar. Keep electrical cords out of the way. Do not use floor polish or wax that makes floors slippery. If you must use wax, use non-skid floor wax. Do not have throw rugs and other things on the floor that can make you trip. What can I do with my stairs? Do not leave any items on the stairs. Make sure that there are handrails on both sides of the stairs and use them. Fix handrails that are broken or loose. Make sure that handrails are as long as the stairways. Check any carpeting to make sure that it is firmly attached to the stairs. Fix any carpet that is loose or worn. Avoid having throw rugs at  the top or bottom of the stairs. If you do have throw rugs, attach them to the floor with carpet tape. Make sure that you have a light switch at the top of the stairs and the bottom of the stairs. If you do not have them, ask someone to add them for you. What else can I do to help prevent falls? Wear shoes that: Do not have high heels. Have rubber bottoms. Are comfortable and fit you well. Are closed at the toe. Do not wear sandals. If you use a stepladder: Make sure that it is fully opened. Do not climb a closed stepladder. Make sure that both sides of the stepladder are locked into place. Ask someone to hold it for you, if possible. Clearly mark and make sure that you  can see: Any grab bars or handrails. First and last steps. Where the edge of each step is. Use tools that help you move around (mobility aids) if they are needed. These include: Canes. Walkers. Scooters. Crutches. Turn on the lights when you go into a dark area. Replace any light bulbs as soon as they burn out. Set up your furniture so you have a clear path. Avoid moving your furniture around. If any of your floors are uneven, fix them. If there are any pets around you, be aware of where they are. Review your medicines with your doctor. Some medicines can make you feel dizzy. This can increase your chance of falling. Ask your doctor what other things that you can do to help prevent falls. This information is not intended to replace advice given to you by your health care provider. Make sure you discuss any questions you have with your health care provider. Document Released: 10/11/2009 Document Revised: 05/22/2016 Document Reviewed: 01/19/2015 Elsevier Interactive Patient Education  2017 [employer].

## 2022-09-19 ENCOUNTER — Emergency Department (HOSPITAL_COMMUNITY): Payer: Medicare Other

## 2022-09-19 ENCOUNTER — Other Ambulatory Visit: Payer: Self-pay

## 2022-09-19 ENCOUNTER — Emergency Department (HOSPITAL_COMMUNITY)
Admission: EM | Admit: 2022-09-19 | Discharge: 2022-09-19 | Disposition: A | Discharge status: Home / Self Care | Payer: Medicare Other | Attending: Emergency Medicine | Admitting: Emergency Medicine

## 2022-09-19 ENCOUNTER — Encounter (HOSPITAL_COMMUNITY): Payer: Self-pay | Admitting: Emergency Medicine

## 2022-09-19 DIAGNOSIS — Z7901 Long term (current) use of anticoagulants: Secondary | ICD-10-CM | POA: Insufficient documentation

## 2022-09-19 DIAGNOSIS — M545 Low back pain, unspecified: Secondary | ICD-10-CM | POA: Diagnosis not present

## 2022-09-19 DIAGNOSIS — M47814 Spondylosis without myelopathy or radiculopathy, thoracic region: Secondary | ICD-10-CM | POA: Diagnosis not present

## 2022-09-19 DIAGNOSIS — W01190A Fall on same level from slipping, tripping and stumbling with subsequent striking against furniture, initial encounter: Secondary | ICD-10-CM | POA: Diagnosis not present

## 2022-09-19 DIAGNOSIS — I10 Essential (primary) hypertension: Secondary | ICD-10-CM | POA: Diagnosis not present

## 2022-09-19 DIAGNOSIS — S22080A Wedge compression fracture of T11-T12 vertebra, initial encounter for closed fracture: Secondary | ICD-10-CM

## 2022-09-19 DIAGNOSIS — S0990XA Unspecified injury of head, initial encounter: Secondary | ICD-10-CM | POA: Diagnosis not present

## 2022-09-19 DIAGNOSIS — S199XXA Unspecified injury of neck, initial encounter: Secondary | ICD-10-CM | POA: Diagnosis not present

## 2022-09-19 DIAGNOSIS — R519 Headache, unspecified: Secondary | ICD-10-CM | POA: Diagnosis not present

## 2022-09-19 DIAGNOSIS — M4126 Other idiopathic scoliosis, lumbar region: Secondary | ICD-10-CM | POA: Diagnosis not present

## 2022-09-19 DIAGNOSIS — M549 Dorsalgia, unspecified: Secondary | ICD-10-CM | POA: Diagnosis not present

## 2022-09-19 DIAGNOSIS — S22089A Unspecified fracture of T11-T12 vertebra, initial encounter for closed fracture: Secondary | ICD-10-CM | POA: Diagnosis not present

## 2022-09-19 DIAGNOSIS — M25551 Pain in right hip: Secondary | ICD-10-CM | POA: Diagnosis not present

## 2022-09-19 DIAGNOSIS — M40204 Unspecified kyphosis, thoracic region: Secondary | ICD-10-CM | POA: Diagnosis not present

## 2022-09-19 DIAGNOSIS — Z043 Encounter for examination and observation following other accident: Secondary | ICD-10-CM | POA: Diagnosis present

## 2022-09-19 DIAGNOSIS — R102 Pelvic and perineal pain: Secondary | ICD-10-CM | POA: Diagnosis not present

## 2022-09-19 DIAGNOSIS — M8588 Other specified disorders of bone density and structure, other site: Secondary | ICD-10-CM | POA: Diagnosis not present

## 2022-09-19 DIAGNOSIS — I491 Atrial premature depolarization: Secondary | ICD-10-CM | POA: Diagnosis not present

## 2022-09-19 LAB — CBC WITH DIFFERENTIAL/PLATELET
Abs Immature Granulocytes: 0.07 10*3/uL (ref 0.00–0.07)
Basophils Absolute: 0 10*3/uL (ref 0.0–0.1)
Basophils Relative: 0 %
Eosinophils Absolute: 0.2 10*3/uL (ref 0.0–0.5)
Eosinophils Relative: 2 %
HCT: 43.8 % (ref 36.0–46.0)
Hemoglobin: 14.7 g/dL (ref 12.0–15.0)
Immature Granulocytes: 1 %
Lymphocytes Relative: 19 %
Lymphs Abs: 1.5 10*3/uL (ref 0.7–4.0)
MCH: 32.7 pg (ref 26.0–34.0)
MCHC: 33.6 g/dL (ref 30.0–36.0)
MCV: 97.6 fL (ref 80.0–100.0)
Monocytes Absolute: 0.7 10*3/uL (ref 0.1–1.0)
Monocytes Relative: 9 %
Neutro Abs: 5.8 10*3/uL (ref 1.7–7.7)
Neutrophils Relative %: 69 %
Platelets: 160 10*3/uL (ref 150–400)
RBC: 4.49 MIL/uL (ref 3.87–5.11)
RDW: 13.6 % (ref 11.5–15.5)
WBC: 8.3 10*3/uL (ref 4.0–10.5)
nRBC: 0 % (ref 0.0–0.2)

## 2022-09-19 LAB — COMPREHENSIVE METABOLIC PANEL
ALT: 21 U/L (ref 0–44)
AST: 23 U/L (ref 15–41)
Albumin: 3.8 g/dL (ref 3.5–5.0)
Alkaline Phosphatase: 64 U/L (ref 38–126)
Anion gap: 9 (ref 5–15)
BUN: 17 mg/dL (ref 8–23)
CO2: 22 mmol/L (ref 22–32)
Calcium: 10 mg/dL (ref 8.9–10.3)
Chloride: 109 mmol/L (ref 98–111)
Creatinine, Ser: 0.76 mg/dL (ref 0.44–1.00)
GFR, Estimated: >60 mL/min (ref >60)
Glucose, Bld: 119 mg/dL — ABNORMAL HIGH (ref 70–99)
Potassium: 4 mmol/L (ref 3.5–5.1)
Sodium: 140 mmol/L (ref 135–145)
Total Bilirubin: 0.6 mg/dL (ref 0.3–1.2)
Total Protein: 7 g/dL (ref 6.5–8.1)

## 2022-09-19 LAB — TYPE AND SCREEN
ABO/RH(D): A POS
Antibody Screen: POSITIVE
DAT, IgG: NEGATIVE
Unit division: 0
Unit division: 0
Unit division: 0
Unit division: 0

## 2022-09-19 LAB — BPAM RBC
Blood Product Expiration Date: 202310122359
Blood Product Expiration Date: 202310122359
Blood Product Expiration Date: 202310122359
Blood Product Expiration Date: 202310132359
Unit Type and Rh: 6200
Unit Type and Rh: 6200
Unit Type and Rh: 6200
Unit Type and Rh: 6200

## 2022-09-19 LAB — PROTIME-INR
INR: 1 (ref 0.8–1.2)
Prothrombin Time: 13.5 seconds (ref 11.4–15.2)

## 2022-09-19 MED ORDER — ACETAMINOPHEN 500 MG PO TABS
1000.0000 mg | ORAL_TABLET | Freq: Once | ORAL | Status: AC
  Administered 2022-09-19: 1000 mg via ORAL
  Filled 2022-09-19: qty 2

## 2022-09-19 NOTE — ED Triage Notes (Signed)
Pt bib gcems from home as level 2 fall on thinners. Pt had mechanical fall and hit head on end table. Denies LOC. Pt ambulatory after fall. Pt complaining of head, neck, and lower back pain. C collar in place on arrival. GCS 15.   BP 178/90, HR 80, CBG 177

## 2022-09-19 NOTE — ED Notes (Signed)
Trauma Response Nurse Documentation   Amy Fischer is a 88 y.o. female arriving to [facility] ED via EMS  On Eliquis (apixaban) daily. Trauma was activated as a Level 2 by ED charge RN based on the following trauma criteria Elderly patients > 65 with head trauma on anti-coagulation (excluding ASA). Trauma team at the bedside on patient arrival.   Patient cleared for CT by Dr. Dayna Barker EDP. Pt transported to CT with trauma response nurse present to monitor. RN remained with the patient throughout their absence from the department for clinical observation.   GCS 15.  History   Past Medical History:  Diagnosis Date   Allergy    Anemia    Asymptomatic carotid artery stenosis    R ICA 40% stenosis on CTA 12/2011.   Bladder polyps    Cataract    BILATERAL-REMOVED   CHF (congestive heart failure) (HCC)    Diverticulosis    Elevated blood pressure 03/25/2011   Bp readings borderline today has hx of elvation  In office and ok at home   Not checke recently   She gfeels was elevated from anxiety     Episodic recurrent vertigo    MRI Head 2003   Esophageal spasm    GERD (gastroesophageal reflux disease)    Hepatic hemangioma    History of hepatitis    unknown type   Hyperlipidemia    Hyperplastic colon polyp    Hypothyroidism    Intestinal metaplasia of gastric mucosa    Osteoarthritis    Osteoporosis    Patellar fracture 07/13/2012   Scapular fracture 03/31/2012   small from direct blow with trip     Subclavian steal syndrome    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; CTA 2013 revealed open graft     Past Surgical History:  Procedure Laterality Date   APPENDECTOMY     CARDIAC CATHETERIZATION N/A 10/04/2015   Procedure: Right/Left Heart Cath and Coronary Angiography;  Surgeon: Belva Crome, MD;  Location: [place] [facility];  Service: Cardiovascular;  Laterality: N/A;   CAROTID-SUBCLAVIAN BYPASS GRAFT Left 1999   for Hardy steal syndrome   CHOLECYSTECTOMY     COLONOSCOPY      CYSTECTOMY Left    hand   ELBOW SURGERY Left    KNEE SURGERY Left 2014   MITRAL VALVE REPAIR N/A 10/10/2015   Procedure: MITRAL VALVE REPAIR (MVR) WITH SIZE 28 CARPENTIER-EDWARDS PHYSIO II ANNULOPLASTY RING;  Surgeon: [name], MD;  Location: [place];  Service: Open Heart Surgery;  Laterality: N/A;   ROTATOR CUFF REPAIR Left    TEAR DUCT PROBING  07/2014   TEE WITHOUT CARDIOVERSION N/A 10/03/2015   Procedure: TRANSESOPHAGEAL ECHOCARDIOGRAM (TEE);  Surgeon: Larey Dresser, MD;  Location: [place];  Service: Cardiovascular;  Laterality: N/A;   TEE WITHOUT CARDIOVERSION N/A 10/10/2015   Procedure: TRANSESOPHAGEAL ECHOCARDIOGRAM (TEE);  Surgeon: [name], MD;  Location: [place];  Service: Open Heart Surgery;  Laterality: N/A;   TONSILLECTOMY     TUBAL LIGATION         Initial Focused Assessment (If applicable, or please see trauma documentation): Alert/oriented female arrives from home after a fall with head injury/headache, no obvious injuries, headache, neck pain, back pain Airway patent/unobstructed, BS equal No obvious uncontrolled hemorrhage GCS 15 PERRLA  CT's Completed:   CT Head and CT C-Spine CT thoracic and lumbar  Interventions:  Cts as above Left hip XRAY Trauma lab draw Family updates  Plan  for disposition:  Pending workup  Consults completed:  none at the time of this note.  Event Summary: Fall at home with head injury, no obvious wounds/deformities.   MTP Summary (If applicable): NA  Bedside handoff with ED RN Shirlee Limerick.    Chloe Baig O Jendaya Gossett  Trauma Response RN  Please call TRN at [phone] for further assistance.

## 2022-09-19 NOTE — ED Notes (Signed)
C-collar removed by Dr. Dayna Barker

## 2022-09-19 NOTE — ED Notes (Addendum)
Patient transported to CT with [place], TRN.

## 2022-09-19 NOTE — ED Provider Notes (Signed)
[facility] EMERGENCY DEPARTMENT Provider Note   CSN: [id number] Arrival date & time: 09/19/22  0028     History  Chief Complaint  Patient presents with   Fall    Level 2    Amy Fischer is a 88 y.o. female.  88 year old female on Eliquis for A-fib who presents the ER today after a fall hit her head.  She states that really her only pain after the fall was a headache.  However she is unsure where she hit her head.  She states at 1 point she had some pain in her right hip but that seems to have subsided.   Fall       Home Medications Prior to Admission medications   Medication Sig Start Date End Date Taking? Authorizing Provider  albuterol (VENTOLIN HFA) 108 (90 Base) MCG/ACT inhaler Inhale 2 puffs into the lungs every 6 (six) hours as needed for wheezing or shortness of breath. 12/24/21   Kozlow, Donnamarie Poag, MD  cholecalciferol (VITAMIN D) 1000 units tablet Take 1,000 Units by mouth daily.    [provider]  DiphenhydrAMINE HCl (BENADRYL PO) Take by mouth.    [provider]  ELIQUIS 2.5 MG TABS tablet TAKE 1 TABLET TWICE A DAY 05/12/22   Lelon Perla, MD  ezetimibe (ZETIA) 10 MG tablet TAKE 1 TABLET DAILY 08/11/22   Panosh, [name], MD  folic acid (FOLVITE) 1 MG tablet TAKE 1 TABLET DAILY 08/21/22   Panosh, [name], MD  glucosamine-chondroitin 500-400 MG tablet Take 1 tablet by mouth daily. Patient not taking: Reported on 09/16/2022    [provider]  ipratropium (ATROVENT) 0.06 % nasal spray USE 1 SPRAY IN EACH NOSTRIL TWICE A DAY AS NEEDED FOR RHINITIS 03/07/22   Panosh, [name], MD  ipratropium-albuterol (DUONEB) 0.5-2.5 (3) MG/3ML SOLN Take 3 mLs by nebulization every 6 (six) hours as needed. Patient not taking: Reported on 09/16/2022 12/24/21   Jiles Prows, MD  levothyroxine (SYNTHROID) 100 MCG tablet TAKE 1 TABLET EVERY MORNING ON AN EMPTY STOMACH 08/28/22   Panosh, [name], MD  loratadine (CLARITIN) 10 MG tablet Take 1  tablet (10 mg total) by mouth 2 (two) times daily as needed for allergies (Use an extra dose during flare ups.). 12/24/21   Kozlow, Donnamarie Poag, MD  Melatonin 3 MG TABS Take 3 mg by mouth at bedtime.     [provider]  MULTIPLE VITAMIN PO Take 1 tablet by mouth daily. 50+ Senior Vitamin Daily    [provider]  omeprazole (PRILOSEC) 40 MG capsule Take 1 capsule (40 mg total) by mouth daily. 12/24/21   Kozlow, Donnamarie Poag, MD  ondansetron (ZOFRAN) 4 MG tablet Take 1 tablet (4 mg total) by mouth every 6 (six) hours. Patient not taking: Reported on 09/16/2022 11/24/20   Tedd Sias, PA  Polyethyl Glycol-Propyl Glycol (SYSTANE OP) Place 1 drop into both eyes 2 (two) times daily. Use 1-2 Drops in both eyes    [provider]  rosuvastatin (CRESTOR) 5 MG tablet TAKE ONE-HALF (1/2) TABLET DAILY 07/09/22   Panosh, [name], MD  SALINE NASAL SPRAY NA Place 1 spray into both nostrils 2 (two) times daily as needed (congestion).     [provider]  traMADol (ULTRAM) 50 MG tablet Take 1 tablet (50 mg total) by mouth every 6 (six) hours as needed for up to 15 doses for severe pain. Patient not taking: Reported on 09/16/2022 04/05/22   Trifan,  Carola Rhine, MD  triamcinolone (NASACORT) 55 MCG/ACT AERO nasal inhaler Place 1 spray into the nose daily.    [provider]  vitamin B-12 (CYANOCOBALAMIN) 1000 MCG tablet Take 1,000 mcg by mouth daily.    [provider]      Allergies    Codeine, Risedronate sodium, Statins, Tape, and Amoxicillin-pot clavulanate    Review of Systems   Review of Systems  Physical Exam Updated Vital Signs BP (!) 172/82   Pulse 90   Temp 98 F (36.7 C) (Oral)   Resp 19   Ht '5\' 1"'$  (1.549 m)   Wt 50.8 kg   SpO2 97%   BMI 21.16 kg/m  Physical Exam Vitals and nursing note reviewed.  Constitutional:      Appearance: She is well-developed.  HENT:     Head: Normocephalic and atraumatic. Head contusion: no obvious contusions or  hematomas.  Eyes:     Pupils: Pupils are equal, round, and reactive to light.  Cardiovascular:     Rate and Rhythm: Normal rate and regular rhythm.  Pulmonary:     Effort: No respiratory distress.     Breath sounds: No stridor.  Abdominal:     General: There is no distension.  Musculoskeletal:        General: Tenderness (lumbar midline, right hip) present.     Cervical back: Normal range of motion.  Skin:    General: Skin is warm and dry.  Neurological:     General: No focal deficit present.     Mental Status: She is alert.     ED Results / Procedures / Treatments   Labs (all labs ordered are listed, but only abnormal results are displayed) Labs Reviewed  COMPREHENSIVE METABOLIC PANEL - Abnormal; Notable for the following components:      Result Value   Glucose, Bld 119 (*)    All other components within normal limits  CBC WITH DIFFERENTIAL/PLATELET  PROTIME-INR  TYPE AND SCREEN    EKG None  Radiology DG Hip Unilat W or Wo Pelvis 2-3 Views Right  Result Date: 09/19/2022 CLINICAL DATA:  Recent fall with pelvic pain, initial encounter EXAM: DG HIP (WITH OR WITHOUT PELVIS) 3V RIGHT COMPARISON:  12/07/2014 FINDINGS: Pelvic ring is intact. Healed inferior pubic ramus fracture on the left is noted. Degenerative changes of the right hip joint are seen. Irregularity in the right superior pubic ramus is seen consistent with fracture although some healing is noted and this is likely subacute to chronic in nature. IMPRESSION: Chronic left inferior pubic ramus fracture. Irregularity of the superior pubic ramus on the right with some suggestion of healing. This likely is subacute to chronic in nature. CT may be helpful for further evaluation. Electronically Signed   By: Inez Catalina M.D.   On: 09/19/2022 00:59    Procedures Procedures    Medications Ordered in ED Medications  acetaminophen (TYLENOL) tablet 1,000 mg (1,000 mg Oral Given 09/19/22 0048)    ED Course/ Medical  Decision Making/ A&P                           Medical Decision Making Amount and/or Complexity of Data Reviewed Labs: ordered. Radiology: ordered.  Risk OTC drugs.  Eval for bony injury.  Work-up showed a T12 compression fracture which correlates with her site of spinal tenderness.  TLSO brace placed.  Patient and daughter do not want narcotics so was given instructions to use Tylenol and follow-up  with neurosurgery for further management.  Neurologically intact at time of discharge able to ambulate without difficulty with a TLSO brace on.        Final Clinical Impression(s) / ED Diagnoses Final diagnoses:  None    Rx / DC Orders ED Discharge Orders     None         Jim Lundin, Corene Cornea, MD 09/19/22 [phone]

## 2022-09-19 NOTE — ED Notes (Signed)
Pt able to ambulate with cane. Has a walker available at home

## 2022-09-19 NOTE — Progress Notes (Signed)
Orthopedic Tech Progress Note Patient Details:  Amy Fischer [birth year] [id number]  Ortho Devices Type of Ortho Device: Thoracolumbar corset (TLSO) Ortho Device/Splint Interventions: Ordered, Application, Adjustment   Post Interventions Patient Tolerated: Well Instructions Provided: Care of device, Adjustment of device  [name] 09/19/2022, 4:49 AM

## 2022-09-23 DIAGNOSIS — M1990 Unspecified osteoarthritis, unspecified site: Secondary | ICD-10-CM | POA: Diagnosis not present

## 2022-09-23 DIAGNOSIS — R278 Other lack of coordination: Secondary | ICD-10-CM | POA: Diagnosis not present

## 2022-09-23 DIAGNOSIS — G629 Polyneuropathy, unspecified: Secondary | ICD-10-CM | POA: Diagnosis not present

## 2022-09-23 DIAGNOSIS — Z9181 History of falling: Secondary | ICD-10-CM | POA: Diagnosis not present

## 2022-09-26 ENCOUNTER — Encounter (HOSPITAL_COMMUNITY): Payer: Self-pay

## 2022-09-26 ENCOUNTER — Emergency Department (HOSPITAL_COMMUNITY)
Admission: EM | Admit: 2022-09-26 | Discharge: 2022-09-29 | Disposition: A | Discharge status: Home / Self Care | Payer: Medicare Other | Attending: Emergency Medicine | Admitting: Emergency Medicine

## 2022-09-26 DIAGNOSIS — K5900 Constipation, unspecified: Secondary | ICD-10-CM | POA: Insufficient documentation

## 2022-09-26 DIAGNOSIS — S29002D Unspecified injury of muscle and tendon of back wall of thorax, subsequent encounter: Secondary | ICD-10-CM | POA: Diagnosis present

## 2022-09-26 DIAGNOSIS — I5030 Unspecified diastolic (congestive) heart failure: Secondary | ICD-10-CM | POA: Diagnosis not present

## 2022-09-26 DIAGNOSIS — S22089D Unspecified fracture of T11-T12 vertebra, subsequent encounter for fracture with routine healing: Secondary | ICD-10-CM | POA: Insufficient documentation

## 2022-09-26 DIAGNOSIS — Z7901 Long term (current) use of anticoagulants: Secondary | ICD-10-CM | POA: Diagnosis not present

## 2022-09-26 DIAGNOSIS — Z20822 Contact with and (suspected) exposure to covid-19: Secondary | ICD-10-CM | POA: Insufficient documentation

## 2022-09-26 DIAGNOSIS — W19XXXD Unspecified fall, subsequent encounter: Secondary | ICD-10-CM | POA: Insufficient documentation

## 2022-09-26 DIAGNOSIS — S22080D Wedge compression fracture of T11-T12 vertebra, subsequent encounter for fracture with routine healing: Secondary | ICD-10-CM

## 2022-09-26 DIAGNOSIS — I4891 Unspecified atrial fibrillation: Secondary | ICD-10-CM | POA: Diagnosis not present

## 2022-09-26 DIAGNOSIS — M546 Pain in thoracic spine: Secondary | ICD-10-CM | POA: Diagnosis not present

## 2022-09-26 DIAGNOSIS — R9431 Abnormal electrocardiogram [ECG] [EKG]: Secondary | ICD-10-CM | POA: Diagnosis not present

## 2022-09-26 LAB — COMPREHENSIVE METABOLIC PANEL
ALT: 15 U/L (ref 0–44)
AST: 19 U/L (ref 15–41)
Albumin: 3.6 g/dL (ref 3.5–5.0)
Alkaline Phosphatase: 58 U/L (ref 38–126)
Anion gap: 4 — ABNORMAL LOW (ref 5–15)
BUN: 17 mg/dL (ref 8–23)
CO2: 25 mmol/L (ref 22–32)
Calcium: 9.4 mg/dL (ref 8.9–10.3)
Chloride: 111 mmol/L (ref 98–111)
Creatinine, Ser: 0.84 mg/dL (ref 0.44–1.00)
GFR, Estimated: >60 mL/min (ref >60)
Glucose, Bld: 97 mg/dL (ref 70–99)
Potassium: 4.1 mmol/L (ref 3.5–5.1)
Sodium: 140 mmol/L (ref 135–145)
Total Bilirubin: 0.5 mg/dL (ref 0.3–1.2)
Total Protein: 6.6 g/dL (ref 6.5–8.1)

## 2022-09-26 LAB — CBC WITH DIFFERENTIAL/PLATELET
Abs Immature Granulocytes: 0.01 10*3/uL (ref 0.00–0.07)
Basophils Absolute: 0.1 10*3/uL (ref 0.0–0.1)
Basophils Relative: 1 %
Eosinophils Absolute: 0.2 10*3/uL (ref 0.0–0.5)
Eosinophils Relative: 3 %
HCT: 41.1 % (ref 36.0–46.0)
Hemoglobin: 13.7 g/dL (ref 12.0–15.0)
Immature Granulocytes: 0 %
Lymphocytes Relative: 24 %
Lymphs Abs: 1.5 10*3/uL (ref 0.7–4.0)
MCH: 32.5 pg (ref 26.0–34.0)
MCHC: 33.3 g/dL (ref 30.0–36.0)
MCV: 97.4 fL (ref 80.0–100.0)
Monocytes Absolute: 0.5 10*3/uL (ref 0.1–1.0)
Monocytes Relative: 9 %
Neutro Abs: 3.8 10*3/uL (ref 1.7–7.7)
Neutrophils Relative %: 63 %
Platelets: 237 10*3/uL (ref 150–400)
RBC: 4.22 MIL/uL (ref 3.87–5.11)
RDW: 13.9 % (ref 11.5–15.5)
WBC: 6.1 10*3/uL (ref 4.0–10.5)
nRBC: 0 % (ref 0.0–0.2)

## 2022-09-26 LAB — RESP PANEL BY RT-PCR (FLU A&B, COVID) ARPGX2
Influenza A by PCR: NEGATIVE
Influenza B by PCR: NEGATIVE
SARS Coronavirus 2 by RT PCR: NEGATIVE

## 2022-09-26 MED ORDER — ACETAMINOPHEN 325 MG PO TABS
650.0000 mg | ORAL_TABLET | ORAL | Status: DC | PRN
  Administered 2022-09-27 – 2022-09-28 (×4): 650 mg via ORAL
  Filled 2022-09-26 (×4): qty 2

## 2022-09-26 MED ORDER — PANTOPRAZOLE SODIUM 40 MG PO TBEC
40.0000 mg | DELAYED_RELEASE_TABLET | Freq: Every day | ORAL | Status: DC
  Administered 2022-09-27 – 2022-09-29 (×3): 40 mg via ORAL
  Filled 2022-09-26 (×3): qty 1

## 2022-09-26 MED ORDER — LEVOTHYROXINE SODIUM 100 MCG PO TABS
100.0000 ug | ORAL_TABLET | Freq: Every day | ORAL | Status: DC
  Administered 2022-09-27 – 2022-09-29 (×3): 100 ug via ORAL
  Filled 2022-09-26 (×3): qty 1

## 2022-09-26 MED ORDER — FOLIC ACID 1 MG PO TABS
1.0000 mg | ORAL_TABLET | Freq: Every day | ORAL | Status: DC
  Administered 2022-09-27 – 2022-09-29 (×3): 1 mg via ORAL
  Filled 2022-09-26 (×3): qty 1

## 2022-09-26 MED ORDER — EZETIMIBE 10 MG PO TABS
10.0000 mg | ORAL_TABLET | Freq: Every day | ORAL | Status: DC
  Administered 2022-09-28 – 2022-09-29 (×2): 10 mg via ORAL
  Filled 2022-09-26 (×3): qty 1

## 2022-09-26 MED ORDER — ALBUTEROL SULFATE HFA 108 (90 BASE) MCG/ACT IN AERS: 2.0000 | INHALATION_SPRAY | Freq: Four times a day (QID) | RESPIRATORY_TRACT | Status: DC | PRN

## 2022-09-26 MED ORDER — APIXABAN 2.5 MG PO TABS
2.5000 mg | ORAL_TABLET | Freq: Two times a day (BID) | ORAL | Status: DC
  Administered 2022-09-27 – 2022-09-29 (×6): 2.5 mg via ORAL
  Filled 2022-09-26 (×8): qty 1

## 2022-09-26 MED ORDER — BISACODYL 5 MG PO TBEC
5.0000 mg | DELAYED_RELEASE_TABLET | Freq: Once | ORAL | Status: AC
  Administered 2022-09-27: 5 mg via ORAL
  Filled 2022-09-26: qty 1

## 2022-09-26 NOTE — ED Notes (Signed)
Per Dr.Rancour (ordering provider), hold off on EKG at this time due to pt having TLSO brace in place. RN aware.

## 2022-09-26 NOTE — ED Provider Notes (Signed)
[facility] DEPT Provider Note   CSN: [id number] Arrival date & time: 09/26/22  2148     History {Add pertinent medical, surgical, social history, OB history to HPI:1} Chief Complaint  Patient presents with   Fall   Failure To Thrive    Amy Fischer is a 88 y.o. female.  The history is provided by the patient.  Fall  She has history of hyperlipidemia, diastolic heart failure, paroxysmal atrial fibrillation anticoagulated on apixaban and comes in because of pain and inability to care for herself at home.  She had fallen 1 week ago and suffered a compression fracture of T12.  She is in a TLSO brace.  She has been taking acetaminophen at home for pain because she is intolerant of narcotics.  She states that she is just not able to manage at home.  She also states that she is constipated.  She has taken Senokot and another powder that is mixed with applesauce with only minimal results.   Home Medications Prior to Admission medications   Medication Sig Start Date End Date Taking? Authorizing Provider  albuterol (VENTOLIN HFA) 108 (90 Base) MCG/ACT inhaler Inhale 2 puffs into the lungs every 6 (six) hours as needed for wheezing or shortness of breath. 12/24/21   Kozlow, Donnamarie Poag, MD  cholecalciferol (VITAMIN D) 1000 units tablet Take 1,000 Units by mouth daily.    [provider]  DiphenhydrAMINE HCl (BENADRYL PO) Take by mouth.    [provider]  ELIQUIS 2.5 MG TABS tablet TAKE 1 TABLET TWICE A DAY 05/12/22   Lelon Perla, MD  ezetimibe (ZETIA) 10 MG tablet TAKE 1 TABLET DAILY 08/11/22   Panosh, [name], MD  folic acid (FOLVITE) 1 MG tablet TAKE 1 TABLET DAILY 08/21/22   Panosh, [name], MD  glucosamine-chondroitin 500-400 MG tablet Take 1 tablet by mouth daily. Patient not taking: Reported on 09/16/2022    [provider]  ipratropium (ATROVENT) 0.06 % nasal spray USE 1 SPRAY IN EACH NOSTRIL TWICE A DAY AS NEEDED FOR RHINITIS  03/07/22   Panosh, [name], MD  ipratropium-albuterol (DUONEB) 0.5-2.5 (3) MG/3ML SOLN Take 3 mLs by nebulization every 6 (six) hours as needed. Patient not taking: Reported on 09/16/2022 12/24/21   Jiles Prows, MD  levothyroxine (SYNTHROID) 100 MCG tablet TAKE 1 TABLET EVERY MORNING ON AN EMPTY STOMACH 08/28/22   Panosh, [name], MD  loratadine (CLARITIN) 10 MG tablet Take 1 tablet (10 mg total) by mouth 2 (two) times daily as needed for allergies (Use an extra dose during flare ups.). 12/24/21   Kozlow, Donnamarie Poag, MD  Melatonin 3 MG TABS Take 3 mg by mouth at bedtime.     [provider]  MULTIPLE VITAMIN PO Take 1 tablet by mouth daily. 50+ Senior Vitamin Daily    [provider]  omeprazole (PRILOSEC) 40 MG capsule Take 1 capsule (40 mg total) by mouth daily. 12/24/21   Kozlow, Donnamarie Poag, MD  ondansetron (ZOFRAN) 4 MG tablet Take 1 tablet (4 mg total) by mouth every 6 (six) hours. Patient not taking: Reported on 09/16/2022 11/24/20   Tedd Sias, PA  Polyethyl Glycol-Propyl Glycol (SYSTANE OP) Place 1 drop into both eyes 2 (two) times daily. Use 1-2 Drops in both eyes    [provider]  rosuvastatin (CRESTOR) 5 MG tablet TAKE ONE-HALF (1/2) TABLET DAILY 07/09/22   Panosh, [name], MD  SALINE NASAL SPRAY NA Place 1 spray into both  nostrils 2 (two) times daily as needed (congestion).     [provider]  traMADol (ULTRAM) 50 MG tablet Take 1 tablet (50 mg total) by mouth every 6 (six) hours as needed for up to 15 doses for severe pain. Patient not taking: Reported on 09/16/2022 04/05/22   Wyvonnia Dusky, MD  triamcinolone (NASACORT) 55 MCG/ACT AERO nasal inhaler Place 1 spray into the nose daily.    [provider]  vitamin B-12 (CYANOCOBALAMIN) 1000 MCG tablet Take 1,000 mcg by mouth daily.    [provider]      Allergies    Codeine, Risedronate sodium, Statins, Tape, and Amoxicillin-pot clavulanate    Review of Systems   Review of  Systems  All other systems reviewed and are negative.   Physical Exam Updated Vital Signs BP (!) 140/86   Pulse 84   Temp 98.4 F (36.9 C) (Oral)   Resp 16   SpO2 96%  Physical Exam Vitals and nursing note reviewed.   88 year old female, resting comfortably and in no acute distress. Vital signs are normal. Oxygen saturation is 96%, which is normal. Head is normocephalic and atraumatic. PERRLA, EOMI. Oropharynx is clear. Neck is nontender and supple without adenopathy or JVD. Back: TLSO brace in place, not removed. Lungs are clear without rales, wheezes, or rhonchi. Chest is nontender. Heart has regular rate and rhythm without murmur. Abdomen is soft, flat, nontender. Extremities have no cyanosis or edema, full range of motion is present. Skin is warm and dry without rash. Neurologic: Mental status is normal, cranial nerves are intact, moves all extremities equally.  ED Results / Procedures / Treatments   Labs (all labs ordered are listed, but only abnormal results are displayed) Labs Reviewed  RESP PANEL BY RT-PCR (FLU A&B, COVID) ARPGX2  CBC WITH DIFFERENTIAL/PLATELET  COMPREHENSIVE METABOLIC PANEL  URINALYSIS, ROUTINE W REFLEX MICROSCOPIC    EKG None  Radiology No results found.  Procedures Procedures  {Document cardiac monitor, telemetry assessment procedure when appropriate:1}  Medications Ordered in ED Medications - No data to display  ED Course/ Medical Decision Making/ A&P                           Medical Decision Making  Fracture of T12 with significant pain making it difficult for patient to manage at home.  She will need at least short-term care in a rehabilitation facility, but at this point no indication for hospital admission.  Screening labs of CBC, comprehensive metabolic panel, urinalysis have been ordered, results pending.  I have reviewed her old records confirming ED visit on 09/19/2022 with fall and resulting compression fracture of T12.  She  will need to be held in the emergency department overnight for evaluation by transitions of care team.  {Document critical care time when appropriate:1} {Document review of labs and clinical decision tools ie heart score, Chads2Vasc2 etc:1}  {Document your independent review of radiology images, and any outside records:1} {Document your discussion with family members, caretakers, and with consultants:1} {Document social determinants of health affecting pt's care:1} {Document your decision making why or why not admission, treatments were needed:1} Final Clinical Impression(s) / ED Diagnoses Final diagnoses:  Compression fracture of T12 vertebra with routine healing, subsequent encounter  Chronic anticoagulation    Rx / DC Orders ED Discharge Orders     None

## 2022-09-26 NOTE — ED Triage Notes (Signed)
Pt BIB [facility] from home. Pt suffered fall fracturing T12 1 week ago. Pt c/o pain. Pt unable to care for herself at home.

## 2022-09-27 DIAGNOSIS — S22089D Unspecified fracture of T11-T12 vertebra, subsequent encounter for fracture with routine healing: Secondary | ICD-10-CM | POA: Diagnosis not present

## 2022-09-27 LAB — URINALYSIS, ROUTINE W REFLEX MICROSCOPIC
Bilirubin Urine: NEGATIVE
Glucose, UA: NEGATIVE mg/dL
Hgb urine dipstick: NEGATIVE
Ketones, ur: 5 mg/dL — AB
Nitrite: NEGATIVE
Protein, ur: NEGATIVE mg/dL
Specific Gravity, Urine: 1.016 (ref 1.005–1.030)
pH: 6 (ref 5.0–8.0)

## 2022-09-27 MED ORDER — ALBUTEROL SULFATE (2.5 MG/3ML) 0.083% IN NEBU: 2.5000 mg | INHALATION_SOLUTION | Freq: Four times a day (QID) | RESPIRATORY_TRACT | Status: DC | PRN

## 2022-09-27 MED ORDER — POLYETHYLENE GLYCOL 3350 17 G PO PACK
17.0000 g | PACK | Freq: Every day | ORAL | Status: DC
  Administered 2022-09-27 – 2022-09-28 (×2): 17 g via ORAL
  Filled 2022-09-27 (×3): qty 1

## 2022-09-27 NOTE — ED Notes (Signed)
Pt called out for restroom assistance. Pt advises that she has never been able to use a bed pan. RN advised to do bedside potty instead of purewick. Pt was assisted x2 (this Agricultural consultant) to bedside commode. Process was painful for pt. Call light in reach and pt instructed to call out if she was done before we returned. Pt understood and agreed.

## 2022-09-27 NOTE — Progress Notes (Signed)
Transition of Care [facility]) - Emergency Department Mini Assessment   Patient Details  Name: Amy Fischer MRN: [id number] Date of Birth: [birth year]  Transition [facility]) CM/SW Contact:    Rodney Booze, LCSW Phone Number: 09/27/2022, 11:54 AM   Clinical Narrative:  CSW spoke with the Pt as well the Pt daughter. The Pt is not sure if she would like to go to a SNF, CSW advised the Pt that we would at least send it out to see if she could get a place she would like. The Pt is very aware X4 and request that if she does go she will need a private room. CSW made the PT aware that I will note it however I had no control over that decision. CSW will keep the family updated as updates come. CSW also spoke to the family about private care as well home health.   ED Mini Assessment: What brought you to the Emergency Department? : (P) Pt suffered fall fracturing T12 1 week ago. Pt c/o pain. Pt unable to care for herself at home.  Barriers to Discharge: (P) No Barriers Identified  Barrier interventions: (P) Pt is hestite about doing SNF, CSW still sent out as patients decides.  Means of departure: (P)  (Not sure what Patient will decide.)  Interventions which prevented an admission or readmission: SNF Placement    Patient Contact and Communications     Spoke with: Amy Fischer) Amy Fischer [phone] Contact Date: (P) 09/27/22,   Contact time: (P) 1154 Contact Phone Number: (P) [phone]    Patient states their goals for this hospitalization and ongoing recovery are:: (P) The Pt just wants to get back moblie CMS Medicare.gov Compare Post Acute Care list provided to:: (P) Patient Choice offered to / list presented to : (P) Patient  Admission diagnosis:  Back Pain Patient Active Problem List   Diagnosis Date Noted   CAD (coronary artery disease) 02/22/2016   Unsteadiness 11/20/2015   PAF (paroxysmal atrial fibrillation) ([place]) 11/06/2015   Chronic diastolic CHF (congestive heart  failure), NYHA class 2 ([place]) 11/06/2015   S/P MVR (mitral valve repair) 10/10/2015   Severe mitral regurgitation    Shortness of breath    Acute congestive heart failure ([place])    Left hip pain    Mitral regurgitation 10/02/2015   Paroxysmal atrial fibrillation ([place]) 10/01/2015   Acute respiratory failure with hypoxia and hypercapnia (HCC)    Respiratory distress 09/30/2015   Acute on chronic diastolic congestive heart failure, NYHA class 3 ([place]) 09/30/2015   Hx of fracture 04/26/2015   Diplopia 04/26/2015   Medicare annual wellness visit, subsequent 07/26/2014   Diarrhea 03/30/2014   Rhinitis 10/06/2013   Hx of vomiting 05/26/2013   Carotid artery disease without cerebral infarction ([place]) 03/22/2013   Hypertension, isolated systolic 12/21/8249   Bereavement 03/22/2013   Fracture, finger 07/13/2012   Neuritis 04/10/2012   Constipation, other cause 03/31/2012   Hx of fall 03/31/2012   Esophageal disorder 11/27/2011   Atypical chest pain 11/27/2011   Dizziness 11/27/2011   Elevated blood pressure 03/25/2011   Subclavian steal syndrome 09/24/2010   GAIT IMBALANCE 03/19/2010   SKIN LESION, UNCERTAIN SIGNIFICANCE 09/18/2009   SLEEPLESSNESS 03/20/2009   HYPERGLYCEMIA 03/20/2009   VITAMIN D DEFICIENCY 11/09/2007   ALLERGIC RHINITIS 11/09/2007   LEG PAIN, BILATERAL 11/09/2007   Hypothyroidism 08/19/2007   HYPERLIPIDEMIA 08/19/2007   GERD 08/19/2007   OSTEOARTHRITIS 08/19/2007   Osteoporosis 08/19/2007   PCP:  Burnis Medin, MD Pharmacy:  [facility], [place] [address] [place] [address] Phone: [phone] Fax: [place] [phone] - [address] [place] [address] [place] [address] [place] [address] Phone: [phone] Fax: [phone]  [id number]Phone: [phone]: [phone]

## 2022-09-27 NOTE — ED Notes (Signed)
[place], [place] [phone] asking for uopadate on PT and placement rehab options when available.

## 2022-09-27 NOTE — NC FL2 (Signed)
[place] MEDICAID FL2 LEVEL OF CARE SCREENING TOOL     IDENTIFICATION  Patient Name: Amy Fischer Birthdate: 10-Aug-1929 Sex: female Admission Date (Current Location): 09/26/2022  [facility] and [place] Number:      Facility and Address:  [facility],  [place], [place]      Provider Number: [id number]  Attending Physician Name and Address:  Default, Provider, MD  Relative Name and Phone Number:  [name] [phone]    Current Level of Care: Hospital Recommended Level of Care: [place] Prior Approval Number:    Date Approved/Denied: 09/27/22 PASRR Number: [id number]  Discharge Plan: Home    Current Diagnoses: Patient Active Problem List   Diagnosis Date Noted   CAD (coronary artery disease) 02/22/2016   Unsteadiness 11/20/2015   PAF (paroxysmal atrial fibrillation) ([employer]) 11/06/2015   Chronic diastolic CHF (congestive heart failure), NYHA class 2 ([place]) 11/06/2015   S/P MVR (mitral valve repair) 10/10/2015   Severe mitral regurgitation    Shortness of breath    Acute congestive heart failure ([place])    Left hip pain    Mitral regurgitation 10/02/2015   Paroxysmal atrial fibrillation ([place]) 10/01/2015   Acute respiratory failure with hypoxia and hypercapnia (HCC)    Respiratory distress 09/30/2015   Acute on chronic diastolic congestive heart failure, NYHA class 3 ([place]) 09/30/2015   Hx of fracture 04/26/2015   Diplopia 04/26/2015   Medicare annual wellness visit, subsequent 07/26/2014   Diarrhea 03/30/2014   Rhinitis 10/06/2013   Hx of vomiting 05/26/2013   Carotid artery disease without cerebral infarction ([place]) 03/22/2013   Hypertension, isolated systolic 48/27/0786   Bereavement 03/22/2013   Fracture, finger 07/13/2012   Neuritis 04/10/2012   Constipation, other cause 03/31/2012   Hx of fall 03/31/2012   Esophageal disorder 11/27/2011   Atypical chest pain 11/27/2011   Dizziness 11/27/2011   Elevated blood  pressure 03/25/2011   Subclavian steal syndrome 09/24/2010   GAIT IMBALANCE 03/19/2010   SKIN LESION, UNCERTAIN SIGNIFICANCE 09/18/2009   SLEEPLESSNESS 03/20/2009   HYPERGLYCEMIA 03/20/2009   VITAMIN D DEFICIENCY 11/09/2007   ALLERGIC RHINITIS 11/09/2007   LEG PAIN, BILATERAL 11/09/2007   Hypothyroidism 08/19/2007   HYPERLIPIDEMIA 08/19/2007   GERD 08/19/2007   OSTEOARTHRITIS 08/19/2007   Osteoporosis 08/19/2007    Orientation RESPIRATION BLADDER Height & Weight     Self, Time, Situation, Place  Normal Continent Weight:   Height:     BEHAVIORAL SYMPTOMS/MOOD NEUROLOGICAL BOWEL NUTRITION STATUS      Continent    AMBULATORY STATUS COMMUNICATION OF NEEDS Skin   Limited Assist Verbally Normal                       Personal Care Assistance Level of Assistance  Bathing, Feeding, Dressing Bathing Assistance: Limited assistance Feeding assistance: Independent Dressing Assistance: Limited assistance     Functional Limitations Info  Sight, Hearing, Speech Sight Info: Adequate Hearing Info: Adequate Speech Info: Adequate    SPECIAL CARE FACTORS FREQUENCY                       Contractures Contractures Info: Not present    Additional Factors Info  Code Status, Allergies Code Status Info: Prior Allergies Info: Codeine Not Specified  Nausea And Vomiting   Risedronate Sodium Not Specified   Upset stomach. Could take Fosamax.  Statins Not Specified   Muscles hurt, can take low dose  Tape Not Specified  Other (See Comments)  Blisters, Please use "paper" tape  Amoxicillin-pot Clavulanate           Current Medications (09/27/2022):  This is the current hospital active medication list Current Facility-Administered Medications  Medication Dose Route Frequency Provider Last Rate Last Admin   acetaminophen (TYLENOL) tablet 650 mg  650 mg Oral A2Z PRN Delora Fuel, MD       albuterol (PROVENTIL) (2.5 MG/3ML) 0.083% nebulizer solution 2.5 mg  2.5 mg Nebulization H0Q PRN  Delora Fuel, MD       apixaban Arne [place]) tablet 2.5 mg  2.5 mg Oral BID Delora Fuel, MD   2.5 mg at 09/27/22 0052   ezetimibe (ZETIA) tablet 10 mg  10 mg Oral Daily Delora Fuel, MD       folic acid (FOLVITE) tablet 1 mg  1 mg Oral Daily Delora Fuel, MD       levothyroxine (SYNTHROID) tablet 100 mcg  100 mcg Oral Daily Delora Fuel, MD   657 mcg at 09/27/22 0657   pantoprazole (PROTONIX) EC tablet 40 mg  40 mg Oral Daily Delora Fuel, MD       polyethylene glycol (MIRALAX / GLYCOLAX) packet 17 g  17 g Oral Daily Godfrey Pick, MD       Current Outpatient Medications  Medication Sig Dispense Refill   albuterol (VENTOLIN HFA) 108 (90 Base) MCG/ACT inhaler Inhale 2 puffs into the lungs every 6 (six) hours as needed for wheezing or shortness of breath. 18 g 2   cholecalciferol (VITAMIN D) 1000 units tablet Take 1,000 Units by mouth daily.     ELIQUIS 2.5 MG TABS tablet TAKE 1 TABLET TWICE A DAY 180 tablet 3   ezetimibe (ZETIA) 10 MG tablet TAKE 1 TABLET DAILY 90 tablet 3   folic acid (FOLVITE) 1 MG tablet TAKE 1 TABLET DAILY 90 tablet 3   ipratropium (ATROVENT) 0.06 % nasal spray USE 1 SPRAY IN EACH NOSTRIL TWICE A DAY AS NEEDED FOR RHINITIS (Patient taking differently: Place 1 spray into both nostrils in the morning and at bedtime.) 45 mL 3   levothyroxine (SYNTHROID) 100 MCG tablet TAKE 1 TABLET EVERY MORNING ON AN EMPTY STOMACH 90 tablet 3   Melatonin 3 MG TABS Take 3 mg by mouth at bedtime.      MULTIPLE VITAMIN PO Take 1 tablet by mouth daily. 50+ Senior Vitamin Daily     Polyethyl Glycol-Propyl Glycol (SYSTANE OP) Place 1 drop into both eyes 2 (two) times daily.     rosuvastatin (CRESTOR) 5 MG tablet TAKE ONE-HALF (1/2) TABLET DAILY 90 tablet 3   SALINE NASAL SPRAY NA Place 1 spray into both nostrils in the morning and at bedtime.     vitamin B-12 (CYANOCOBALAMIN) 1000 MCG tablet Take 1,000 mcg by mouth daily.     ipratropium-albuterol (DUONEB) 0.5-2.5 (3) MG/3ML SOLN Take 3 mLs by  nebulization every 6 (six) hours as needed. (Patient not taking: Reported on 09/16/2022) 150 mL 2   loratadine (CLARITIN) 10 MG tablet Take 1 tablet (10 mg total) by mouth 2 (two) times daily as needed for allergies (Use an extra dose during flare ups.). (Patient not taking: Reported on 09/27/2022) 60 tablet 11   omeprazole (PRILOSEC) 40 MG capsule Take 1 capsule (40 mg total) by mouth daily. (Patient not taking: Reported on 09/27/2022) 90 capsule 4   ondansetron (ZOFRAN) 4 MG tablet Take 1 tablet (4 mg total) by mouth every 6 (six) hours. (Patient not taking: Reported on 09/16/2022) 12 tablet 0   traMADol (  ULTRAM) 50 MG tablet Take 1 tablet (50 mg total) by mouth every 6 (six) hours as needed for up to 15 doses for severe pain. (Patient not taking: Reported on 09/16/2022) 15 tablet 0     Discharge Medications: Please see discharge summary for a list of discharge medications.  Relevant Imaging Results:  Relevant Lab Results:   Additional Information [place] Juelz Claar LCSW-A SSI# [id number]  Rodney Booze, [place]

## 2022-09-27 NOTE — Social Work (Addendum)
CSW spoke to the daughter and the facilities that the family has picked was [place], [facility], [place], and [place] [facility]. CSW again made the family aware that we have no control over these choices but will note the chart with their request. TOC will continue to follow.

## 2022-09-27 NOTE — Progress Notes (Signed)
Pt has requested Private bed / room if decides on a facility. Notes in the chart per conversation.

## 2022-09-27 NOTE — Evaluation (Signed)
Physical Therapy Evaluation Patient Details Name: Amy Fischer MRN: [id number] DOB: 23-Feb-1929 Today's Date: 09/27/2022  History of Present Illness  She has history of hyperlipidemia, diastolic heart failure, paroxysmal atrial fibrillation anticoagulated on apixaban and comes in 09/26/22  because of pain and inability to care for herself at home, constipation.  She had fallen 1 week ago and suffered a compression fracture of T12.  She is in a TLSO brace.  Clinical Impression  Patient admitted for above problems.  Patient's daughter present.  Patient reports that she had been  functioning with intermittent assistance  since fall and T12 fx, Able to manage TLSO, ambulate in her home until yesterday , reports that she could not walk. 'Patient also endorses lack of BM for many days, daughter confirms.    Patient  did require some assistance to get up from stretcher, reports sleeping on her couch since wearing brace.  Patient currently requires increased support at home until feeling better from constipation.  Pt admitted with above diagnosis.  Pt currently with functional limitations due to the deficits listed below (see PT Problem List). Pt will benefit from skilled PT to increase their independence and safety with mobility to allow discharge to the venue listed below.        Recommendations for follow up therapy are one component of a multi-disciplinary discharge planning process, led by the attending physician.  Recommendations may be updated based on patient status, additional functional criteria and insurance authorization.  Follow Up Recommendations Skilled nursing-short term rehab (<3 hours/day) (vs HHPT and 24/7 caregivers.) Can patient physically be transported by private vehicle: Yes    Assistance Recommended at Discharge Frequent or constant Supervision/Assistance  Patient can return home with the following  A little help with walking and/or transfers;A little help with  bathing/dressing/bathroom;Assistance with cooking/housework;Help with stairs or ramp for entrance    Equipment Recommendations None recommended by PT  Recommendations for Other Services       Functional Status Assessment Patient has had a recent decline in their functional status and demonstrates the ability to make significant improvements in function in a reasonable and predictable amount of time.     Precautions / Restrictions Precautions Precautions: Fall;Back Required Braces or Orthoses: Spinal Brace Spinal Brace: Thoracolumbosacral orthotic;Applied in sitting position Restrictions Weight Bearing Restrictions: No      Mobility  Bed Mobility Overal bed mobility: Needs Assistance Bed Mobility: Rolling, Sidelying to Sit, Sit to Supine Rolling: Mod assist Sidelying to sit: Mod assist, HOB elevated   Sit to supine: Min assist, HOB elevated   General bed mobility comments: extra time, cues to log roll, assist with trunk to sit up,assist with each leg back onto stretcher    Transfers Overall transfer level: Needs assistance Equipment used: Rolling walker (2 wheels) Transfers: Sit to/from Stand Sit to Stand: Min assist, Min guard           General transfer comment: steady assist to rise initially from stretcher, min guard from [facility]    Ambulation/Gait Ambulation/Gait assistance: Min guard, Min assist Gait Distance (Feet): 5 Feet (then 50') Assistive device: Rolling walker (2 wheels) Gait Pattern/deviations: Step-through pattern, Trunk flexed Gait velocity: decr     General Gait Details: gait steady using Rw, steady to turn  Science writer    Modified Rankin (Stroke Patients Only)       Balance Overall balance assessment: Needs assistance, History of Falls Sitting-balance support: No upper  extremity supported, Feet supported Sitting balance-Leahy Scale: Fair     Standing balance support: Bilateral upper extremity supported,  During functional activity Standing balance-Leahy Scale: Poor Standing balance comment: support to steady when pushing  and pulling  pants/briefs                             Pertinent Vitals/Pain Pain Assessment Pain Assessment: Faces Faces Pain Scale: Hurts little more Pain Location: back when  rolling to sit up and return  to bed. Pain Descriptors / Indicators: Discomfort, Guarding Pain Intervention(s): Monitored during session, Patient requesting pain meds-RN notified    Home Living Family/patient expects to be discharged to:: Private residence Living Arrangements: Alone Available Help at Discharge: Family;Available 24 hours/day Type of Home: Apartment Home Access: Level entry       Home Layout: One level Home Equipment: Conservation officer, nature (2 wheels);Rollator (4 wheels);Shower seat;Grab bars - tub/shower Additional Comments: pt's bed HOB raises, Has been having HHPT 1 x week for balance    Prior Function Prior Level of Function : Independent/Modified Independent;History of Falls (last six months)             Mobility Comments: since  T12 fracture has been functioning home with assist for meals, has been able to get up, don/doff TLSO and ambulate in home without assistance.pt. reports sleeping on the couch more recently ADLs Comments: has been able to don/doff TLSO< reports sits in a chair and leaves it there until replaces brace.dress self, removes TLSO when toileting     Hand Dominance   Dominant Hand: Right    Extremity/Trunk Assessment   Upper Extremity Assessment Upper Extremity Assessment: Defer to OT evaluation    Lower Extremity Assessment Lower Extremity Assessment: RLE deficits/detail;LLE deficits/detail RLE Deficits / Details: WFL for ambulation RLE Sensation: history of peripheral neuropathy LLE Deficits / Details: same as right LLE Sensation: history of peripheral neuropathy    Cervical / Trunk Assessment Cervical / Trunk Assessment:  Kyphotic  Communication   Communication: No difficulties  Cognition Arousal/Alertness: Awake/alert Behavior During Therapy: WFL for tasks assessed/performed Overall Cognitive Status: Within Functional Limits for tasks assessed                                          General Comments      Exercises     Assessment/Plan    PT Assessment Patient needs continued PT services  PT Problem List Decreased strength;Decreased activity tolerance;Decreased mobility;Decreased balance;Decreased knowledge of use of DME;Decreased knowledge of precautions;Impaired sensation;Pain       PT Treatment Interventions DME instruction;Functional mobility training;Balance training;Patient/family education;Gait training;Therapeutic activities;Therapeutic exercise    PT Goals (Current goals can be found in the Care Plan section)  Acute Rehab PT Goals Patient Stated Goal: I want to go home PT Goal Formulation: With patient/family Time For Goal Achievement: 10/11/22 Potential to Achieve Goals: Good    Frequency Min 3X/week     Co-evaluation               AM-PAC PT "6 Clicks" Mobility  Outcome Measure Help needed turning from your back to your side while in a flat bed without using bedrails?: A Lot Help needed moving from lying on your back to sitting on the side of a flat bed without using bedrails?: A Lot Help needed moving to and from a bed  to a chair (including a wheelchair)?: A Little Help needed standing up from a chair using your arms (e.g., wheelchair or bedside chair)?: A Little Help needed to walk in hospital room?: A Little Help needed climbing 3-5 steps with a railing? : A Lot 6 Click Score: 15    End of Session Equipment Utilized During Treatment: Gait belt;Back brace Activity Tolerance: Patient tolerated treatment well Patient left: in bed;with family/visitor present;with call bell/phone within reach Nurse Communication: Mobility status PT Visit Diagnosis:  Unsteadiness on feet (R26.81);History of falling (Z91.81);Difficulty in walking, not elsewhere classified (R26.2);Pain    Time: 0911-0950 PT Time Calculation (min) (ACUTE ONLY): 39 min   Charges:   PT Evaluation $PT Eval Low Complexity: 1 Low PT Treatments $Gait Training: 8-22 mins $Self Care/Home Management: [place] Office [phone] Weekend [phone]   Claretha Cooper 09/27/2022, 10:36 AM

## 2022-09-27 NOTE — Social Work (Signed)
CSW called daughter N/A left voicemail

## 2022-09-28 DIAGNOSIS — S22089D Unspecified fracture of T11-T12 vertebra, subsequent encounter for fracture with routine healing: Secondary | ICD-10-CM | POA: Diagnosis not present

## 2022-09-28 NOTE — ED Provider Notes (Signed)
  Physical Exam  BP 138/89 (BP Location: Left Arm)   Pulse 87   Temp 97.9 F (36.6 C) (Oral)   Resp 18   SpO2 94%   Physical Exam  Procedures  Procedures  ED Course / MDM    Medical Decision Making Risk OTC drugs. Prescription drug management.  Patient is pending nursing home placement.  Fall with compression fracture.  Has TLSO brace.  Pending short-term rehab.  However the brace does not appear to fit.  Appears too large and is reportedly cranked all the way down.  Will order again and have resized if needed.       Davonna Belling, MD 09/28/22 1420

## 2022-09-28 NOTE — Progress Notes (Signed)
There are currently no bed offers at this time likely due to admissions reps being off on the weekend. TOC will follow up with agencies tomorrow morning. Bed offers pending.   Addend @ 10:30 AM This CSW presented bed offer at [facility] to the pt and her daughter. They wish to accept the bed offer. This CSW has accepted [facility] in the [place]. The pt is able to be transported by private vehicle, per Blanchard Kelch, PT and will be transported to the facility by her daughter, Amil Amen.

## 2022-09-28 NOTE — ED Notes (Signed)
Night time care performed by patient [facility] Oral care

## 2022-09-29 DIAGNOSIS — R2681 Unsteadiness on feet: Secondary | ICD-10-CM | POA: Diagnosis not present

## 2022-09-29 DIAGNOSIS — R531 Weakness: Secondary | ICD-10-CM | POA: Diagnosis not present

## 2022-09-29 DIAGNOSIS — I509 Heart failure, unspecified: Secondary | ICD-10-CM | POA: Diagnosis not present

## 2022-09-29 DIAGNOSIS — R278 Other lack of coordination: Secondary | ICD-10-CM | POA: Diagnosis not present

## 2022-09-29 DIAGNOSIS — R0603 Acute respiratory distress: Secondary | ICD-10-CM | POA: Diagnosis not present

## 2022-09-29 DIAGNOSIS — M1990 Unspecified osteoarthritis, unspecified site: Secondary | ICD-10-CM | POA: Diagnosis not present

## 2022-09-29 DIAGNOSIS — I5030 Unspecified diastolic (congestive) heart failure: Secondary | ICD-10-CM | POA: Diagnosis not present

## 2022-09-29 DIAGNOSIS — K5900 Constipation, unspecified: Secondary | ICD-10-CM | POA: Diagnosis not present

## 2022-09-29 DIAGNOSIS — R0602 Shortness of breath: Secondary | ICD-10-CM | POA: Diagnosis not present

## 2022-09-29 DIAGNOSIS — I340 Nonrheumatic mitral (valve) insufficiency: Secondary | ICD-10-CM | POA: Diagnosis not present

## 2022-09-29 DIAGNOSIS — M546 Pain in thoracic spine: Secondary | ICD-10-CM | POA: Diagnosis not present

## 2022-09-29 DIAGNOSIS — I480 Paroxysmal atrial fibrillation: Secondary | ICD-10-CM | POA: Diagnosis not present

## 2022-09-29 DIAGNOSIS — E785 Hyperlipidemia, unspecified: Secondary | ICD-10-CM | POA: Diagnosis not present

## 2022-09-29 DIAGNOSIS — S22089D Unspecified fracture of T11-T12 vertebra, subsequent encounter for fracture with routine healing: Secondary | ICD-10-CM | POA: Diagnosis not present

## 2022-09-29 DIAGNOSIS — R42 Dizziness and giddiness: Secondary | ICD-10-CM | POA: Diagnosis not present

## 2022-09-29 DIAGNOSIS — M810 Age-related osteoporosis without current pathological fracture: Secondary | ICD-10-CM | POA: Diagnosis not present

## 2022-09-29 DIAGNOSIS — S22089S Unspecified fracture of T11-T12 vertebra, sequela: Secondary | ICD-10-CM | POA: Diagnosis not present

## 2022-09-29 DIAGNOSIS — R7309 Other abnormal glucose: Secondary | ICD-10-CM | POA: Diagnosis not present

## 2022-09-29 DIAGNOSIS — Z9181 History of falling: Secondary | ICD-10-CM | POA: Diagnosis not present

## 2022-09-29 DIAGNOSIS — M6281 Muscle weakness (generalized): Secondary | ICD-10-CM | POA: Diagnosis not present

## 2022-09-29 DIAGNOSIS — Z7901 Long term (current) use of anticoagulants: Secondary | ICD-10-CM | POA: Diagnosis not present

## 2022-09-29 DIAGNOSIS — I5033 Acute on chronic diastolic (congestive) heart failure: Secondary | ICD-10-CM | POA: Diagnosis not present

## 2022-09-29 DIAGNOSIS — S22080A Wedge compression fracture of T11-T12 vertebra, initial encounter for closed fracture: Secondary | ICD-10-CM | POA: Diagnosis not present

## 2022-09-29 DIAGNOSIS — I2510 Atherosclerotic heart disease of native coronary artery without angina pectoris: Secondary | ICD-10-CM | POA: Diagnosis not present

## 2022-09-29 DIAGNOSIS — Z9889 Other specified postprocedural states: Secondary | ICD-10-CM | POA: Diagnosis not present

## 2022-09-29 DIAGNOSIS — R0789 Other chest pain: Secondary | ICD-10-CM | POA: Diagnosis not present

## 2022-09-29 DIAGNOSIS — E039 Hypothyroidism, unspecified: Secondary | ICD-10-CM | POA: Diagnosis not present

## 2022-09-29 DIAGNOSIS — I5032 Chronic diastolic (congestive) heart failure: Secondary | ICD-10-CM | POA: Diagnosis not present

## 2022-09-29 DIAGNOSIS — G458 Other transient cerebral ischemic attacks and related syndromes: Secondary | ICD-10-CM | POA: Diagnosis not present

## 2022-09-29 DIAGNOSIS — J309 Allergic rhinitis, unspecified: Secondary | ICD-10-CM | POA: Diagnosis not present

## 2022-09-29 DIAGNOSIS — E559 Vitamin D deficiency, unspecified: Secondary | ICD-10-CM | POA: Diagnosis not present

## 2022-09-29 DIAGNOSIS — K219 Gastro-esophageal reflux disease without esophagitis: Secondary | ICD-10-CM | POA: Diagnosis not present

## 2022-09-29 DIAGNOSIS — R2689 Other abnormalities of gait and mobility: Secondary | ICD-10-CM | POA: Diagnosis not present

## 2022-09-29 DIAGNOSIS — I4891 Unspecified atrial fibrillation: Secondary | ICD-10-CM | POA: Diagnosis not present

## 2022-09-29 DIAGNOSIS — Z20822 Contact with and (suspected) exposure to covid-19: Secondary | ICD-10-CM | POA: Diagnosis not present

## 2022-09-29 NOTE — ED Provider Notes (Addendum)
Emergency Medicine Observation Re-evaluation Note  Amy Fischer is a 88 y.o. female, seen on rounds today.  Pt initially presented to the ED for complaints of Fall and Failure To Thrive Currently, the patient is sleeping.  Physical Exam  BP 138/89 (BP Location: Left Arm)   Pulse 87   Temp 97.9 F (36.6 C) (Oral)   Resp 18   SpO2 94%  Physical Exam General: Sleeping Cardiac: Extremities well-perfused Lungs: Breathing is unlabored Psych: Deferred  ED Course / MDM  EKG:EKG Interpretation  Date/Time:  Saturday September 27 2022 00:58:22 EDT Ventricular Rate:  93 PR Interval:  210 QRS Duration: 92 QT Interval:  405 QTC Calculation: 499 R Axis:   54 Text Interpretation: Sinus rhythm Ventricular premature complex Borderline prolonged PR interval Borderline prolonged QT interval When compared with ECG of 09/19/2022, No significant change was found Confirmed by Delora Fuel ([id number]) on 09/27/2022 1:01:41 AM  I have reviewed the labs performed to date as well as medications administered while in observation.  Recent changes in the last 24 hours include none.  Patient received a TLSO brace today.  She was accepted to skilled nursing facility.  Her daughter will be picking her up to transport her to SNF.  Plan  Current plan is for nursing facility placement.    Godfrey Pick, MD 09/29/22 [id number]    Godfrey Pick, MD 09/29/22 1259

## 2022-09-29 NOTE — ED Notes (Signed)
Patient alert this AM. Cooperative and calm.  No s/s of distress at this time. Patient mediation compliant. Patient ate breakfast.

## 2022-09-29 NOTE — Progress Notes (Signed)
Orthopedic Tech Progress Note Patient Details:  Amy Fischer October 31, 1929 [id number] TLSO brace has been ordered from [facility]  Patient ID: Amy Fischer, female   DOB: July 28, 1929, 88 y.o.   MRN: [id number]  Amy Fischer 09/29/2022, 9:44 AM

## 2022-09-29 NOTE — ED Notes (Signed)
Patient discharged off unit to facility per provider. Patient alert, calm , cooperative and no s/s at this time. Patient ambulatory off unit the unit, escorted by NT. Patient given discharge information and belongings given to patient.  Patient transported by family member.

## 2022-09-29 NOTE — Progress Notes (Signed)
This CSW has informed [facility] ACO post acute via email. They have approved. The daughter will be transporting the pt to the SNF. TOC signing off.

## 2022-10-01 DIAGNOSIS — S22080A Wedge compression fracture of T11-T12 vertebra, initial encounter for closed fracture: Secondary | ICD-10-CM | POA: Diagnosis not present

## 2022-10-02 DIAGNOSIS — M546 Pain in thoracic spine: Secondary | ICD-10-CM | POA: Diagnosis not present

## 2022-10-02 DIAGNOSIS — R531 Weakness: Secondary | ICD-10-CM | POA: Diagnosis not present

## 2022-10-02 DIAGNOSIS — I480 Paroxysmal atrial fibrillation: Secondary | ICD-10-CM | POA: Diagnosis not present

## 2022-10-03 ENCOUNTER — Other Ambulatory Visit: Payer: Self-pay | Admitting: *Deleted

## 2022-10-03 DIAGNOSIS — I5032 Chronic diastolic (congestive) heart failure: Secondary | ICD-10-CM | POA: Diagnosis not present

## 2022-10-03 DIAGNOSIS — G458 Other transient cerebral ischemic attacks and related syndromes: Secondary | ICD-10-CM | POA: Diagnosis not present

## 2022-10-03 DIAGNOSIS — E785 Hyperlipidemia, unspecified: Secondary | ICD-10-CM | POA: Diagnosis not present

## 2022-10-03 DIAGNOSIS — I480 Paroxysmal atrial fibrillation: Secondary | ICD-10-CM | POA: Diagnosis not present

## 2022-10-03 NOTE — Patient Outreach (Signed)
Mrs. Learn resides in [facility]. [facility] 3-day SNF ACO [place] waiver was utilized.   Spoke with [facility] SW to make aware writer is following. Went to room to speak with Mrs. Willeford. However she was occupied. Will follow up at later time.   Will continue to follow.   Marthenia Rolling, MSN, RN,BSN [place] Acute Care Coordinator [phone] (Direct dial)

## 2022-10-09 DIAGNOSIS — Z9181 History of falling: Secondary | ICD-10-CM | POA: Diagnosis not present

## 2022-10-09 DIAGNOSIS — M6281 Muscle weakness (generalized): Secondary | ICD-10-CM | POA: Diagnosis not present

## 2022-10-09 DIAGNOSIS — R2689 Other abnormalities of gait and mobility: Secondary | ICD-10-CM | POA: Diagnosis not present

## 2022-10-14 DIAGNOSIS — Z9181 History of falling: Secondary | ICD-10-CM | POA: Diagnosis not present

## 2022-10-14 DIAGNOSIS — G929 Unspecified toxic encephalopathy: Secondary | ICD-10-CM | POA: Diagnosis not present

## 2022-10-14 DIAGNOSIS — M1990 Unspecified osteoarthritis, unspecified site: Secondary | ICD-10-CM | POA: Diagnosis not present

## 2022-10-14 DIAGNOSIS — R278 Other lack of coordination: Secondary | ICD-10-CM | POA: Diagnosis not present

## 2022-10-16 DIAGNOSIS — M1990 Unspecified osteoarthritis, unspecified site: Secondary | ICD-10-CM | POA: Diagnosis not present

## 2022-10-16 DIAGNOSIS — R278 Other lack of coordination: Secondary | ICD-10-CM | POA: Diagnosis not present

## 2022-10-16 DIAGNOSIS — G929 Unspecified toxic encephalopathy: Secondary | ICD-10-CM | POA: Diagnosis not present

## 2022-10-16 DIAGNOSIS — Z9181 History of falling: Secondary | ICD-10-CM | POA: Diagnosis not present

## 2022-10-17 ENCOUNTER — Other Ambulatory Visit: Payer: Self-pay | Admitting: *Deleted

## 2022-10-17 NOTE — Patient Outreach (Signed)
[place] Coordinator follow up.   Update received from [place] indicating Mrs. Dempster returned home on 10/09/22 with privately paid physical therapy.   No identifiable [facility] care coordination needs at this time.   Marthenia Rolling, MSN, RN,BSN [place] Acute Care Coordinator [phone] (Direct dial)

## 2022-10-21 DIAGNOSIS — G629 Polyneuropathy, unspecified: Secondary | ICD-10-CM | POA: Diagnosis not present

## 2022-10-21 DIAGNOSIS — R278 Other lack of coordination: Secondary | ICD-10-CM | POA: Diagnosis not present

## 2022-10-21 DIAGNOSIS — Z9181 History of falling: Secondary | ICD-10-CM | POA: Diagnosis not present

## 2022-10-21 DIAGNOSIS — M1990 Unspecified osteoarthritis, unspecified site: Secondary | ICD-10-CM | POA: Diagnosis not present

## 2022-10-23 DIAGNOSIS — M1990 Unspecified osteoarthritis, unspecified site: Secondary | ICD-10-CM | POA: Diagnosis not present

## 2022-10-23 DIAGNOSIS — G629 Polyneuropathy, unspecified: Secondary | ICD-10-CM | POA: Diagnosis not present

## 2022-10-23 DIAGNOSIS — R278 Other lack of coordination: Secondary | ICD-10-CM | POA: Diagnosis not present

## 2022-10-23 DIAGNOSIS — Z9181 History of falling: Secondary | ICD-10-CM | POA: Diagnosis not present

## 2022-10-28 DIAGNOSIS — Z9181 History of falling: Secondary | ICD-10-CM | POA: Diagnosis not present

## 2022-10-28 DIAGNOSIS — M1990 Unspecified osteoarthritis, unspecified site: Secondary | ICD-10-CM | POA: Diagnosis not present

## 2022-10-28 DIAGNOSIS — G629 Polyneuropathy, unspecified: Secondary | ICD-10-CM | POA: Diagnosis not present

## 2022-10-28 DIAGNOSIS — R278 Other lack of coordination: Secondary | ICD-10-CM | POA: Diagnosis not present

## 2022-11-04 ENCOUNTER — Other Ambulatory Visit: Payer: Self-pay

## 2022-11-04 ENCOUNTER — Other Ambulatory Visit (INDEPENDENT_AMBULATORY_CARE_PROVIDER_SITE_OTHER): Payer: Medicare Other

## 2022-11-04 DIAGNOSIS — Z9181 History of falling: Secondary | ICD-10-CM | POA: Diagnosis not present

## 2022-11-04 DIAGNOSIS — Z79899 Other long term (current) drug therapy: Secondary | ICD-10-CM

## 2022-11-04 DIAGNOSIS — R278 Other lack of coordination: Secondary | ICD-10-CM | POA: Diagnosis not present

## 2022-11-04 DIAGNOSIS — R739 Hyperglycemia, unspecified: Secondary | ICD-10-CM

## 2022-11-04 DIAGNOSIS — G629 Polyneuropathy, unspecified: Secondary | ICD-10-CM | POA: Diagnosis not present

## 2022-11-04 DIAGNOSIS — M1990 Unspecified osteoarthritis, unspecified site: Secondary | ICD-10-CM | POA: Diagnosis not present

## 2022-11-04 LAB — LIPID PANEL
Cholesterol: 143 mg/dL (ref 0–200)
HDL: 60.6 mg/dL (ref >39.00)
LDL Cholesterol: 65 mg/dL (ref 0–99)
NonHDL: 82.09
Total CHOL/HDL Ratio: 2
Triglycerides: 83 mg/dL (ref 0.0–149.0)
VLDL: 16.6 mg/dL (ref 0.0–40.0)

## 2022-11-04 LAB — BASIC METABOLIC PANEL
BUN: 13 mg/dL (ref 6–23)
CO2: 27 mEq/L (ref 19–32)
Calcium: 9.8 mg/dL (ref 8.4–10.5)
Chloride: 108 mEq/L (ref 96–112)
Creatinine, Ser: 0.74 mg/dL (ref 0.40–1.20)
GFR: 70.64 mL/min (ref >60.00)
Glucose, Bld: 135 mg/dL — ABNORMAL HIGH (ref 70–99)
Potassium: 4.5 mEq/L (ref 3.5–5.1)
Sodium: 141 mEq/L (ref 135–145)

## 2022-11-04 LAB — TSH: TSH: 1.09 u[IU]/mL (ref 0.35–5.50)

## 2022-11-06 DIAGNOSIS — R278 Other lack of coordination: Secondary | ICD-10-CM | POA: Diagnosis not present

## 2022-11-06 DIAGNOSIS — M1990 Unspecified osteoarthritis, unspecified site: Secondary | ICD-10-CM | POA: Diagnosis not present

## 2022-11-06 DIAGNOSIS — Z9181 History of falling: Secondary | ICD-10-CM | POA: Diagnosis not present

## 2022-11-06 DIAGNOSIS — G629 Polyneuropathy, unspecified: Secondary | ICD-10-CM | POA: Diagnosis not present

## 2022-11-09 NOTE — Progress Notes (Signed)
Results in range or acceptable   ( blood sugar up some) but thyroid and cholesterol in good range

## 2022-11-11 DIAGNOSIS — Z9181 History of falling: Secondary | ICD-10-CM | POA: Diagnosis not present

## 2022-11-11 DIAGNOSIS — M1990 Unspecified osteoarthritis, unspecified site: Secondary | ICD-10-CM | POA: Diagnosis not present

## 2022-11-11 DIAGNOSIS — R278 Other lack of coordination: Secondary | ICD-10-CM | POA: Diagnosis not present

## 2022-11-11 DIAGNOSIS — G629 Polyneuropathy, unspecified: Secondary | ICD-10-CM | POA: Diagnosis not present

## 2022-11-17 DIAGNOSIS — S22080D Wedge compression fracture of T11-T12 vertebra, subsequent encounter for fracture with routine healing: Secondary | ICD-10-CM | POA: Diagnosis not present

## 2022-11-17 DIAGNOSIS — S22080A Wedge compression fracture of T11-T12 vertebra, initial encounter for closed fracture: Secondary | ICD-10-CM | POA: Diagnosis not present

## 2022-11-18 DIAGNOSIS — R278 Other lack of coordination: Secondary | ICD-10-CM | POA: Diagnosis not present

## 2022-11-18 DIAGNOSIS — G629 Polyneuropathy, unspecified: Secondary | ICD-10-CM | POA: Diagnosis not present

## 2022-11-18 DIAGNOSIS — Z9181 History of falling: Secondary | ICD-10-CM | POA: Diagnosis not present

## 2022-11-18 DIAGNOSIS — M1990 Unspecified osteoarthritis, unspecified site: Secondary | ICD-10-CM | POA: Diagnosis not present

## 2022-11-25 DIAGNOSIS — M1990 Unspecified osteoarthritis, unspecified site: Secondary | ICD-10-CM | POA: Diagnosis not present

## 2022-11-25 DIAGNOSIS — G629 Polyneuropathy, unspecified: Secondary | ICD-10-CM | POA: Diagnosis not present

## 2022-11-25 DIAGNOSIS — R278 Other lack of coordination: Secondary | ICD-10-CM | POA: Diagnosis not present

## 2022-11-25 DIAGNOSIS — Z9181 History of falling: Secondary | ICD-10-CM | POA: Diagnosis not present

## 2022-11-28 DIAGNOSIS — R278 Other lack of coordination: Secondary | ICD-10-CM | POA: Diagnosis not present

## 2022-11-28 DIAGNOSIS — Z9181 History of falling: Secondary | ICD-10-CM | POA: Diagnosis not present

## 2022-11-28 DIAGNOSIS — M1990 Unspecified osteoarthritis, unspecified site: Secondary | ICD-10-CM | POA: Diagnosis not present

## 2022-11-28 DIAGNOSIS — G629 Polyneuropathy, unspecified: Secondary | ICD-10-CM | POA: Diagnosis not present

## 2022-12-02 DIAGNOSIS — R278 Other lack of coordination: Secondary | ICD-10-CM | POA: Diagnosis not present

## 2022-12-02 DIAGNOSIS — Z9181 History of falling: Secondary | ICD-10-CM | POA: Diagnosis not present

## 2022-12-02 DIAGNOSIS — G629 Polyneuropathy, unspecified: Secondary | ICD-10-CM | POA: Diagnosis not present

## 2022-12-02 DIAGNOSIS — M1990 Unspecified osteoarthritis, unspecified site: Secondary | ICD-10-CM | POA: Diagnosis not present

## 2022-12-03 DIAGNOSIS — Z23 Encounter for immunization: Secondary | ICD-10-CM | POA: Diagnosis not present

## 2022-12-04 DIAGNOSIS — G629 Polyneuropathy, unspecified: Secondary | ICD-10-CM | POA: Diagnosis not present

## 2022-12-04 DIAGNOSIS — M1990 Unspecified osteoarthritis, unspecified site: Secondary | ICD-10-CM | POA: Diagnosis not present

## 2022-12-04 DIAGNOSIS — Z9181 History of falling: Secondary | ICD-10-CM | POA: Diagnosis not present

## 2022-12-04 DIAGNOSIS — R278 Other lack of coordination: Secondary | ICD-10-CM | POA: Diagnosis not present

## 2022-12-09 DIAGNOSIS — R278 Other lack of coordination: Secondary | ICD-10-CM | POA: Diagnosis not present

## 2022-12-09 DIAGNOSIS — G629 Polyneuropathy, unspecified: Secondary | ICD-10-CM | POA: Diagnosis not present

## 2022-12-09 DIAGNOSIS — Z9181 History of falling: Secondary | ICD-10-CM | POA: Diagnosis not present

## 2022-12-09 DIAGNOSIS — M1990 Unspecified osteoarthritis, unspecified site: Secondary | ICD-10-CM | POA: Diagnosis not present

## 2022-12-23 ENCOUNTER — Telehealth: Payer: Self-pay

## 2022-12-23 NOTE — Telephone Encounter (Signed)
---  Caller reports that she just tested positive for COVID. States her symptoms started on Thursday with runny nose and now is having cough. Unknown if she has a fever but states that she does have chills.  12/20/2022 4:02:31 PM Call PCP within 24 Hours Yes Cox, RN, Earnest Bailey  Comments Patient states that she will go to [facility] to get evaluated.  Referrals REFERRED TO PCP OFFICE  Paging Caryl Bis [phone] 12/20/2022 4:06:59 PM Called On Call Provider - Reached Doctor Paged Caryl Bis 12/20/2022 4:08:10 PM Spoke with On Call - General Message Result Because of advanced age and the fact that the offices will be closed for multiple days in a row, On Call Doctor was contacted. On Call states that it would be best if patient were to go to an Urgent Care to get an evaluation since the office will not be open until Tuesday  12/23/22 1443 - Attempted to call the pt at numbers on file & on call sheet.

## 2022-12-28 ENCOUNTER — Emergency Department (HOSPITAL_BASED_OUTPATIENT_CLINIC_OR_DEPARTMENT_OTHER): Payer: Medicare Other

## 2022-12-28 ENCOUNTER — Encounter (HOSPITAL_COMMUNITY): Payer: Self-pay

## 2022-12-28 ENCOUNTER — Encounter (HOSPITAL_BASED_OUTPATIENT_CLINIC_OR_DEPARTMENT_OTHER): Payer: Self-pay

## 2022-12-28 ENCOUNTER — Inpatient Hospital Stay (HOSPITAL_BASED_OUTPATIENT_CLINIC_OR_DEPARTMENT_OTHER)
Admission: EM | Admit: 2022-12-28 | Discharge: 2022-12-30 | DRG: 291 | Disposition: A | Discharge status: Home Health | Payer: Medicare Other | Attending: Internal Medicine | Admitting: Internal Medicine

## 2022-12-28 DIAGNOSIS — Z9049 Acquired absence of other specified parts of digestive tract: Secondary | ICD-10-CM

## 2022-12-28 DIAGNOSIS — M4854XA Collapsed vertebra, not elsewhere classified, thoracic region, initial encounter for fracture: Secondary | ICD-10-CM | POA: Diagnosis present

## 2022-12-28 DIAGNOSIS — Z8249 Family history of ischemic heart disease and other diseases of the circulatory system: Secondary | ICD-10-CM

## 2022-12-28 DIAGNOSIS — U071 COVID-19: Secondary | ICD-10-CM | POA: Diagnosis not present

## 2022-12-28 DIAGNOSIS — R778 Other specified abnormalities of plasma proteins: Secondary | ICD-10-CM | POA: Diagnosis not present

## 2022-12-28 DIAGNOSIS — R0602 Shortness of breath: Secondary | ICD-10-CM | POA: Diagnosis not present

## 2022-12-28 DIAGNOSIS — E039 Hypothyroidism, unspecified: Secondary | ICD-10-CM | POA: Diagnosis not present

## 2022-12-28 DIAGNOSIS — E785 Hyperlipidemia, unspecified: Secondary | ICD-10-CM | POA: Diagnosis present

## 2022-12-28 DIAGNOSIS — Z823 Family history of stroke: Secondary | ICD-10-CM

## 2022-12-28 DIAGNOSIS — S22080A Wedge compression fracture of T11-T12 vertebra, initial encounter for closed fracture: Secondary | ICD-10-CM | POA: Diagnosis not present

## 2022-12-28 DIAGNOSIS — I110 Hypertensive heart disease with heart failure: Secondary | ICD-10-CM | POA: Diagnosis not present

## 2022-12-28 DIAGNOSIS — X58XXXA Exposure to other specified factors, initial encounter: Secondary | ICD-10-CM | POA: Diagnosis present

## 2022-12-28 DIAGNOSIS — I5033 Acute on chronic diastolic (congestive) heart failure: Secondary | ICD-10-CM | POA: Diagnosis present

## 2022-12-28 DIAGNOSIS — Z79899 Other long term (current) drug therapy: Secondary | ICD-10-CM | POA: Diagnosis not present

## 2022-12-28 DIAGNOSIS — Z880 Allergy status to penicillin: Secondary | ICD-10-CM

## 2022-12-28 DIAGNOSIS — Z885 Allergy status to narcotic agent status: Secondary | ICD-10-CM | POA: Diagnosis not present

## 2022-12-28 DIAGNOSIS — Z87891 Personal history of nicotine dependence: Secondary | ICD-10-CM

## 2022-12-28 DIAGNOSIS — R059 Cough, unspecified: Secondary | ICD-10-CM | POA: Diagnosis not present

## 2022-12-28 DIAGNOSIS — Z952 Presence of prosthetic heart valve: Secondary | ICD-10-CM

## 2022-12-28 DIAGNOSIS — I5031 Acute diastolic (congestive) heart failure: Secondary | ICD-10-CM | POA: Diagnosis not present

## 2022-12-28 DIAGNOSIS — Z7901 Long term (current) use of anticoagulants: Secondary | ICD-10-CM

## 2022-12-28 DIAGNOSIS — R5383 Other fatigue: Secondary | ICD-10-CM | POA: Diagnosis not present

## 2022-12-28 DIAGNOSIS — R7989 Other specified abnormal findings of blood chemistry: Secondary | ICD-10-CM | POA: Diagnosis not present

## 2022-12-28 DIAGNOSIS — R053 Chronic cough: Secondary | ICD-10-CM | POA: Diagnosis present

## 2022-12-28 DIAGNOSIS — I509 Heart failure, unspecified: Secondary | ICD-10-CM | POA: Diagnosis not present

## 2022-12-28 DIAGNOSIS — I2510 Atherosclerotic heart disease of native coronary artery without angina pectoris: Secondary | ICD-10-CM | POA: Diagnosis present

## 2022-12-28 DIAGNOSIS — Z888 Allergy status to other drugs, medicaments and biological substances status: Secondary | ICD-10-CM

## 2022-12-28 DIAGNOSIS — J439 Emphysema, unspecified: Secondary | ICD-10-CM | POA: Diagnosis not present

## 2022-12-28 DIAGNOSIS — Z9889 Other specified postprocedural states: Secondary | ICD-10-CM

## 2022-12-28 DIAGNOSIS — Z79890 Hormone replacement therapy (postmenopausal): Secondary | ICD-10-CM

## 2022-12-28 DIAGNOSIS — R5381 Other malaise: Secondary | ICD-10-CM | POA: Diagnosis not present

## 2022-12-28 DIAGNOSIS — I480 Paroxysmal atrial fibrillation: Secondary | ICD-10-CM | POA: Diagnosis present

## 2022-12-28 DIAGNOSIS — S22080G Wedge compression fracture of T11-T12 vertebra, subsequent encounter for fracture with delayed healing: Secondary | ICD-10-CM

## 2022-12-28 LAB — CBC WITH DIFFERENTIAL/PLATELET
Abs Immature Granulocytes: 0.03 10*3/uL (ref 0.00–0.07)
Basophils Absolute: 0 10*3/uL (ref 0.0–0.1)
Basophils Relative: 0 %
Eosinophils Absolute: 0 10*3/uL (ref 0.0–0.5)
Eosinophils Relative: 0 %
HCT: 38.6 % (ref 36.0–46.0)
Hemoglobin: 13.2 g/dL (ref 12.0–15.0)
Immature Granulocytes: 0 %
Lymphocytes Relative: 15 %
Lymphs Abs: 1.2 10*3/uL (ref 0.7–4.0)
MCH: 33.5 pg (ref 26.0–34.0)
MCHC: 34.2 g/dL (ref 30.0–36.0)
MCV: 98 fL (ref 80.0–100.0)
Monocytes Absolute: 0.7 10*3/uL (ref 0.1–1.0)
Monocytes Relative: 8 %
Neutro Abs: 6.6 10*3/uL (ref 1.7–7.7)
Neutrophils Relative %: 77 %
Platelets: 186 10*3/uL (ref 150–400)
RBC: 3.94 MIL/uL (ref 3.87–5.11)
RDW: 13.9 % (ref 11.5–15.5)
WBC: 8.6 10*3/uL (ref 4.0–10.5)
nRBC: 0 % (ref 0.0–0.2)

## 2022-12-28 LAB — COMPREHENSIVE METABOLIC PANEL
ALT: 14 U/L (ref 0–44)
AST: 16 U/L (ref 15–41)
Albumin: 3.6 g/dL (ref 3.5–5.0)
Alkaline Phosphatase: 53 U/L (ref 38–126)
Anion gap: 9 (ref 5–15)
BUN: 13 mg/dL (ref 8–23)
CO2: 27 mmol/L (ref 22–32)
Calcium: 9.6 mg/dL (ref 8.9–10.3)
Chloride: 106 mmol/L (ref 98–111)
Creatinine, Ser: 0.71 mg/dL (ref 0.44–1.00)
GFR, Estimated: >60 mL/min (ref >60)
Glucose, Bld: 93 mg/dL (ref 70–99)
Potassium: 3.8 mmol/L (ref 3.5–5.1)
Sodium: 142 mmol/L (ref 135–145)
Total Bilirubin: 0.7 mg/dL (ref 0.3–1.2)
Total Protein: 6.3 g/dL — ABNORMAL LOW (ref 6.5–8.1)

## 2022-12-28 LAB — RESP PANEL BY RT-PCR (RSV, FLU A&B, COVID)  RVPGX2
Influenza A by PCR: NEGATIVE
Influenza B by PCR: NEGATIVE
Resp Syncytial Virus by PCR: NEGATIVE
SARS Coronavirus 2 by RT PCR: POSITIVE — AB

## 2022-12-28 LAB — TROPONIN I (HIGH SENSITIVITY)
Troponin I (High Sensitivity): 67 ng/L — ABNORMAL HIGH (ref <18)
Troponin I (High Sensitivity): 66 ng/L — ABNORMAL HIGH (ref <18)

## 2022-12-28 LAB — BRAIN NATRIURETIC PEPTIDE: B Natriuretic Peptide: 365.9 pg/mL — ABNORMAL HIGH (ref 0.0–100.0)

## 2022-12-28 MED ORDER — ASPIRIN 81 MG PO CHEW
324.0000 mg | CHEWABLE_TABLET | Freq: Once | ORAL | Status: AC
  Administered 2022-12-28: 324 mg via ORAL
  Filled 2022-12-28: qty 4

## 2022-12-28 MED ORDER — IOHEXOL 350 MG/ML SOLN
100.0000 mL | Freq: Once | INTRAVENOUS | Status: AC | PRN
  Administered 2022-12-28: 80 mL via INTRAVENOUS

## 2022-12-28 MED ORDER — FUROSEMIDE 10 MG/ML IJ SOLN
40.0000 mg | Freq: Once | INTRAMUSCULAR | Status: AC
  Administered 2022-12-28: 40 mg via INTRAVENOUS
  Filled 2022-12-28: qty 4

## 2022-12-28 MED ORDER — DEXAMETHASONE SODIUM PHOSPHATE 10 MG/ML IJ SOLN
10.0000 mg | Freq: Once | INTRAMUSCULAR | Status: AC
  Administered 2022-12-28: 10 mg via INTRAVENOUS
  Filled 2022-12-28: qty 1

## 2022-12-28 NOTE — Progress Notes (Addendum)
Patient is  a 88 year female with history of diastolic CHF, paroxysmal A-fib, hypothyroidism, hypertension, hyperlipidemia, mitral valve replacement who presented from home today with complaints of increasing shortness of breath.  She was diagnosed with COVID on December 22,COVID is still positive.  She is on room air currently.  Lab work showed elevated BNP, elevated troponin with flat trend no chest pain.  Chest x-ray, CT chest did not show any acute findings.  CT imaging also showed T12 endplate compression fracture of 50% but her back pain is not bad. Requested for admission for acute exacerbation of diastolic CHF.  ED physician planning to give her Lasix.  We might need to do an echocardiogram after she comes here.  Though her COVID test is positive, I do not think she needs treatment because she was diagnosed on December 22

## 2022-12-28 NOTE — ED Triage Notes (Signed)
Pt came in via [facility] c/o SOB x 3 days. Positive COVID test 12/22. Pt c/o ongoing exertional SOB and general malaise

## 2022-12-28 NOTE — ED Provider Notes (Signed)
[facility]ENC[facility] DEPT Provider Note   CSN: [id number] Arrival date & time: 12/28/22  2979     History  Chief Complaint  Patient presents with   Shortness of Breath    Amy Fischer is a 88 y.o. female.  HPI      88 year old female with history of paroxysmal atrial fibrillation on Eliquis, hospitalization in 2016 with congestive heart failure with a flail posterior leaflet with severe mitral regurgitation and mitral valve repair in 2016, CAD, hyperlipidemia, diagnosis of COVID 12/22 who presents with concern for shortness of breath.   Woke from sleep 6AM feeling short of breath, if laying down flat has more difficult time breathing, better sitting up For 2-3 days, has been fatigued, just wants to sit, when woke up this AM felt like she did when she had mitral valve problem 10 years ago. Finally got up, went and sat in the living room and felt better but was still short of breath. Cough has been the same, not worse.  No chest pain or pressure.  No recent fever.  No nausea, no vomiting.  Diarrhea better now but was there initially.  No leg pain or swelling.  12/22 tested positive for COVID Her daughter [name] at bedside is a retired ED nurse (including working for [employer])  Past Medical History:  Diagnosis Date   Allergy    Anemia    Asymptomatic carotid artery stenosis    R ICA 40% stenosis on CTA 12/2011.   Bladder polyps    Cataract    BILATERAL-REMOVED   CHF (congestive heart failure) (HCC)    Diverticulosis    Elevated blood pressure 03/25/2011   Bp readings borderline today has hx of elvation  In office and ok at home   Not checke recently   She gfeels was elevated from anxiety     Episodic recurrent vertigo    MRI Head 2003   Esophageal spasm    GERD (gastroesophageal reflux disease)    Hepatic hemangioma    History of hepatitis    unknown type   Hyperlipidemia    Hyperplastic colon polyp    Hypothyroidism    Intestinal metaplasia of  gastric mucosa    Osteoarthritis    Osteoporosis    Patellar fracture 07/13/2012   Scapular fracture 03/31/2012   small from direct blow with trip     Subclavian steal syndrome    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; CTA 2013 revealed open graft     Home Medications Prior to Admission medications   Medication Sig Start Date End Date Taking? Authorizing Provider  albuterol (VENTOLIN HFA) 108 (90 Base) MCG/ACT inhaler Inhale 2 puffs into the lungs every 6 (six) hours as needed for wheezing or shortness of breath. 12/24/21   Kozlow, Donnamarie Poag, MD  cholecalciferol (VITAMIN D) 1000 units tablet Take 1,000 Units by mouth daily.    [provider]  ELIQUIS 2.5 MG TABS tablet TAKE 1 TABLET TWICE A DAY 05/12/22   Lelon Perla, MD  ezetimibe (ZETIA) 10 MG tablet TAKE 1 TABLET DAILY 08/11/22   Panosh, [name], MD  folic acid (FOLVITE) 1 MG tablet TAKE 1 TABLET DAILY 08/21/22   Panosh, [name], MD  ipratropium (ATROVENT) 0.06 % nasal spray USE 1 SPRAY IN EACH NOSTRIL TWICE A DAY AS NEEDED FOR RHINITIS Patient taking differently: Place 1 spray into both nostrils in the morning and at bedtime. 03/07/22   Panosh, [name], MD  ipratropium-albuterol (DUONEB)  0.5-2.5 (3) MG/3ML SOLN Take 3 mLs by nebulization every 6 (six) hours as needed. Patient not taking: Reported on 09/16/2022 12/24/21   Jiles Prows, MD  levothyroxine (SYNTHROID) 100 MCG tablet TAKE 1 TABLET EVERY MORNING ON AN EMPTY STOMACH 08/28/22   Panosh, [name], MD  loratadine (CLARITIN) 10 MG tablet Take 1 tablet (10 mg total) by mouth 2 (two) times daily as needed for allergies (Use an extra dose during flare ups.). Patient not taking: Reported on 09/27/2022 12/24/21   Jiles Prows, MD  Melatonin 3 MG TABS Take 3 mg by mouth at bedtime.     [provider]  MULTIPLE VITAMIN PO Take 1 tablet by mouth daily. 50+ Senior Vitamin Daily    [provider]  omeprazole (PRILOSEC) 40 MG capsule Take 1 capsule (40 mg  total) by mouth daily. Patient not taking: Reported on 09/27/2022 12/24/21   Jiles Prows, MD  Polyethyl Glycol-Propyl Glycol (SYSTANE OP) Place 1 drop into both eyes 2 (two) times daily.    [provider]  rosuvastatin (CRESTOR) 5 MG tablet TAKE ONE-HALF (1/2) TABLET DAILY 07/09/22   Panosh, [name], MD  SALINE NASAL SPRAY NA Place 1 spray into both nostrils in the morning and at bedtime.    [provider]  vitamin B-12 (CYANOCOBALAMIN) 1000 MCG tablet Take 1,000 mcg by mouth daily.    [provider]      Allergies    Codeine, Risedronate sodium, Statins, Tape, and Amoxicillin-pot clavulanate    Review of Systems   Review of Systems  Physical Exam Updated Vital Signs BP (!) 143/73   Pulse 68   Temp 98.3 F (36.8 C) (Oral)   Resp (!) 21   Ht '5\' 1"'$  (1.549 m)   Wt 47.6 kg   SpO2 95%   BMI 19.84 kg/m  Physical Exam Vitals and nursing note reviewed.  Constitutional:      General: She is not in acute distress.    Appearance: She is well-developed. She is not diaphoretic.  HENT:     Head: Normocephalic and atraumatic.  Eyes:     Conjunctiva/sclera: Conjunctivae normal.  Neck:     Vascular: JVD (mild) present.  Cardiovascular:     Rate and Rhythm: Normal rate and regular rhythm.     Heart sounds: Normal heart sounds. No murmur heard.    No friction rub. No gallop.  Pulmonary:     Effort: Pulmonary effort is normal. No respiratory distress.     Breath sounds: Normal breath sounds. No wheezing or rales.  Abdominal:     General: There is no distension.     Palpations: Abdomen is soft.     Tenderness: There is no abdominal tenderness. There is no guarding.  Musculoskeletal:        General: No tenderness.     Cervical back: Normal range of motion.     Right lower leg: No edema.     Left lower leg: No edema.  Skin:    General: Skin is warm and dry.     Findings: No erythema or rash.  Neurological:     Mental Status: She is alert and oriented  to person, place, and time.     Comments: Normal strength and sensation of lower extremities     ED Results / Procedures / Treatments   Labs (all labs ordered are listed, but only abnormal results are displayed) Labs Reviewed  RESP PANEL BY RT-PCR (RSV, FLU A&B, COVID)  RVPGX2 - Abnormal; Notable for the following components:      Result Value   SARS Coronavirus 2 by RT PCR POSITIVE (*)    All other components within normal limits  COMPREHENSIVE METABOLIC PANEL - Abnormal; Notable for the following components:   Total Protein 6.3 (*)    All other components within normal limits  BRAIN NATRIURETIC PEPTIDE - Abnormal; Notable for the following components:   B Natriuretic Peptide 365.9 (*)    All other components within normal limits  TROPONIN I (HIGH SENSITIVITY) - Abnormal; Notable for the following components:   Troponin I (High Sensitivity) 67 (*)    All other components within normal limits  TROPONIN I (HIGH SENSITIVITY) - Abnormal; Notable for the following components:   Troponin I (High Sensitivity) 66 (*)    All other components within normal limits  CBC WITH DIFFERENTIAL/PLATELET    EKG EKG Interpretation  Date/Time:  Sunday December 28 2022 11:14:32 EST Ventricular Rate:  76 PR Interval:  197 QRS Duration: 93 QT Interval:  424 QTC Calculation: 477 R Axis:   20 Text Interpretation: Sinus rhythm Atrial premature complexes in couplets Borderline T abnormalities, inferior leads No significant change since last tracing Confirmed by Gareth Morgan [id number]) on 12/28/2022 12:11:44 PM  Radiology CT Angio Chest PE W and/or Wo Contrast  Result Date: 12/28/2022 CLINICAL DATA:  88 year old female with shortness of breath. Recent COVID positive. EXAM: CT ANGIOGRAPHY CHEST WITH CONTRAST TECHNIQUE: Multidetector CT imaging of the chest was performed using the standard protocol during bolus administration of intravenous contrast. Multiplanar CT image reconstructions and MIPs were  obtained to evaluate the vascular anatomy. RADIATION DOSE REDUCTION: This exam was performed according to the departmental dose-optimization program which includes automated exposure control, adjustment of the mA and/or kV according to patient size and/or use of iterative reconstruction technique. CONTRAST:  70m OMNIPAQUE IOHEXOL 350 MG/ML SOLN COMPARISON:  09/19/2022 thoracic/lumbar spine CT and 04/05/2022 CT FINDINGS: Cardiovascular: Satisfactory opacification of the pulmonary arteries to the segmental level. No evidence of pulmonary embolism. Cardiomegaly and mitral valve replacement again noted. Aortic and coronary artery atherosclerotic calcifications again identified. There is no evidence of thoracic aortic aneurysm or pericardial effusion. Mediastinum/Nodes: No mediastinal mass or suspicious lymph nodes. Calcified mediastinal/hilar lymph nodes are present. The visualized trachea and esophagus are unremarkable. Lungs/Pleura: Unchanged subpleural reticulation and scarring bilaterally. No evidence of airspace disease, consolidation, mass, suspicious nodule, pleural effusion or pneumothorax. Upper Abdomen: No acute abnormality. Musculoskeletal: Increasing T12 SUPERIOR endplate compression fracture now 50%. 4 mm bony retropulsion is now noted narrowing the central canal. Review of the MIP images confirms the above findings. IMPRESSION: 1. Increasing T12 SUPERIOR endplate compression fracture now 50%, with new 4 mm bony retropulsion since 09/19/2022 narrowing the central canal. 2. No evidence of pulmonary emboli or thoracic aortic aneurysm. 3. Unchanged chronic lung findings. 4. Cardiomegaly, mitral valve replacement and coronary artery disease. 5.  Aortic Atherosclerosis (ICD10-I70.0). Electronically Signed   By: JMargarette CanadaM.D.   On: 12/28/2022 15:05   DG Chest Portable 1 View  Result Date: 12/28/2022 CLINICAL DATA:  88year old female with history of dyspnea. Shortness of breath. Generalized malaise.  EXAM: PORTABLE CHEST 1 VIEW COMPARISON:  Chest x-ray 11/04/2021. FINDINGS: Lung volumes are increased with emphysematous changes, diffuse interstitial prominence and widespread peribronchial cuffing, similar to prior studies, likely reflecting chronic bronchitis in the setting of COPD. No consolidative airspace disease. No pleural effusions. No pneumothorax. No pulmonary nodule or mass noted. Pulmonary vasculature is normal. Heart size  is mildly enlarged. Upper mediastinal contours are within normal limits. Atherosclerotic calcifications in the thoracic aorta. Status post median sternotomy for mitral annuloplasty. Old healed fracture of the lateral aspect of the left seventh rib incidentally noted. IMPRESSION: 1. No radiographic evidence of acute cardiopulmonary disease. 2. Chronic changes suggestive of COPD redemonstrated, as above. 3. Mild cardiomegaly. 4. Aortic atherosclerosis. Electronically Signed   By: Vinnie Langton M.D.   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  Differential diagnosis for dyspnea includes ACS, PE, COPD exacerbation, CHF exacerbation, anemia, pneumonia, viral etiology such as COVID 19 infection, metabolic abnormality.  Chest x-ray was done which showed changes suggesting COPD, mild cardiomegaly.    EKG was evaluated by me which showed normal sinus rhythm.  BNP was 364, was 460 when admitted in October 2016 for CHF.  Labs completed and personally abided interpreted by me show normal hemoglobin, no leukocytosis, no significant electrolyte abnormalities.  Troponin elevated to 67.  Given sudden worsening dyspnea this morning, troponin elevation, obtain CT PE study to evaluate for signs of pulmonary embolus and further evaluate lung parenchyma.  CT shows no evidence of PE or thoracic aortic aneurysm, unchanged chronic lung findings, cardiomegaly, mitral valve replacement and coronary artery disease.  Suspect with orthopnea, elevated troponin and BNP component of CHF. Will admit for further care in the setting of troponin levels in 60s, no prior.  Given aspirin, lasix.   Her COVID test is still positive, unclear if dyspnea secondary to COVID as initial symptoms began 9 days ago.  Will give dose of decadron.  CT also shows T12 superior endplate compression fracture now 50% with new 4 mm bony retropulsion since September 22.  She has no numbness, weakness, loss of control of her bowel or bladder, or back pain at this time.  Discussed the CT results with the [place] [facility], reports she could wear a brace but given age/comfort and she is not having pain reasonable to defer and have her follow up outpatient with Dr. Kathyrn Sheriff.             Final Clinical Impression(s) / ED Diagnoses Final diagnoses:  Elevated troponin  Shortness of breath  Acute congestive heart failure, unspecified heart failure type (HCC)  Compression fracture of T12 vertebra with delayed healing, subsequent encounter    Rx / DC Orders ED Discharge Orders     None         Gareth Morgan, MD 12/28/22 1609

## 2022-12-29 ENCOUNTER — Observation Stay (HOSPITAL_COMMUNITY): Payer: Medicare Other

## 2022-12-29 ENCOUNTER — Other Ambulatory Visit: Payer: Self-pay

## 2022-12-29 DIAGNOSIS — E785 Hyperlipidemia, unspecified: Secondary | ICD-10-CM | POA: Diagnosis present

## 2022-12-29 DIAGNOSIS — R0602 Shortness of breath: Secondary | ICD-10-CM | POA: Diagnosis present

## 2022-12-29 DIAGNOSIS — Z79899 Other long term (current) drug therapy: Secondary | ICD-10-CM | POA: Diagnosis not present

## 2022-12-29 DIAGNOSIS — E039 Hypothyroidism, unspecified: Secondary | ICD-10-CM | POA: Diagnosis present

## 2022-12-29 DIAGNOSIS — S22080A Wedge compression fracture of T11-T12 vertebra, initial encounter for closed fracture: Secondary | ICD-10-CM | POA: Diagnosis not present

## 2022-12-29 DIAGNOSIS — I5031 Acute diastolic (congestive) heart failure: Secondary | ICD-10-CM | POA: Diagnosis not present

## 2022-12-29 DIAGNOSIS — Z8249 Family history of ischemic heart disease and other diseases of the circulatory system: Secondary | ICD-10-CM | POA: Diagnosis not present

## 2022-12-29 DIAGNOSIS — X58XXXA Exposure to other specified factors, initial encounter: Secondary | ICD-10-CM | POA: Diagnosis present

## 2022-12-29 DIAGNOSIS — Z7901 Long term (current) use of anticoagulants: Secondary | ICD-10-CM

## 2022-12-29 DIAGNOSIS — U071 COVID-19: Secondary | ICD-10-CM | POA: Diagnosis present

## 2022-12-29 DIAGNOSIS — I5033 Acute on chronic diastolic (congestive) heart failure: Secondary | ICD-10-CM | POA: Diagnosis present

## 2022-12-29 DIAGNOSIS — Z79890 Hormone replacement therapy (postmenopausal): Secondary | ICD-10-CM | POA: Diagnosis not present

## 2022-12-29 DIAGNOSIS — I2510 Atherosclerotic heart disease of native coronary artery without angina pectoris: Secondary | ICD-10-CM | POA: Diagnosis present

## 2022-12-29 DIAGNOSIS — R5383 Other fatigue: Secondary | ICD-10-CM | POA: Diagnosis present

## 2022-12-29 DIAGNOSIS — Z885 Allergy status to narcotic agent status: Secondary | ICD-10-CM | POA: Diagnosis not present

## 2022-12-29 DIAGNOSIS — Z87891 Personal history of nicotine dependence: Secondary | ICD-10-CM | POA: Diagnosis not present

## 2022-12-29 DIAGNOSIS — M4854XA Collapsed vertebra, not elsewhere classified, thoracic region, initial encounter for fracture: Secondary | ICD-10-CM | POA: Diagnosis present

## 2022-12-29 DIAGNOSIS — R7989 Other specified abnormal findings of blood chemistry: Secondary | ICD-10-CM | POA: Diagnosis not present

## 2022-12-29 DIAGNOSIS — Z880 Allergy status to penicillin: Secondary | ICD-10-CM | POA: Diagnosis not present

## 2022-12-29 DIAGNOSIS — I480 Paroxysmal atrial fibrillation: Secondary | ICD-10-CM | POA: Diagnosis present

## 2022-12-29 DIAGNOSIS — Z9889 Other specified postprocedural states: Secondary | ICD-10-CM | POA: Diagnosis not present

## 2022-12-29 DIAGNOSIS — R053 Chronic cough: Secondary | ICD-10-CM | POA: Diagnosis present

## 2022-12-29 DIAGNOSIS — Z888 Allergy status to other drugs, medicaments and biological substances status: Secondary | ICD-10-CM | POA: Diagnosis not present

## 2022-12-29 DIAGNOSIS — Z9049 Acquired absence of other specified parts of digestive tract: Secondary | ICD-10-CM | POA: Diagnosis not present

## 2022-12-29 DIAGNOSIS — Z823 Family history of stroke: Secondary | ICD-10-CM | POA: Diagnosis not present

## 2022-12-29 DIAGNOSIS — Z952 Presence of prosthetic heart valve: Secondary | ICD-10-CM | POA: Diagnosis not present

## 2022-12-29 DIAGNOSIS — I110 Hypertensive heart disease with heart failure: Secondary | ICD-10-CM | POA: Diagnosis present

## 2022-12-29 LAB — MAGNESIUM: Magnesium: 2 mg/dL (ref 1.7–2.4)

## 2022-12-29 LAB — ECHOCARDIOGRAM COMPLETE
Height: 61 in
MV M vel: 3.81 m/s
MV Peak grad: 57.9 mmHg
MV VTI: 0.86 cm2
S' Lateral: 2.3 cm
Weight: 1680 oz

## 2022-12-29 LAB — BASIC METABOLIC PANEL
Anion gap: 9 (ref 5–15)
BUN: 13 mg/dL (ref 8–23)
CO2: 28 mmol/L (ref 22–32)
Calcium: 9.9 mg/dL (ref 8.9–10.3)
Chloride: 103 mmol/L (ref 98–111)
Creatinine, Ser: 0.9 mg/dL (ref 0.44–1.00)
GFR, Estimated: >60 mL/min (ref >60)
Glucose, Bld: 104 mg/dL — ABNORMAL HIGH (ref 70–99)
Potassium: 4.1 mmol/L (ref 3.5–5.1)
Sodium: 140 mmol/L (ref 135–145)

## 2022-12-29 LAB — C-REACTIVE PROTEIN: CRP: 0.8 mg/dL (ref <1.0)

## 2022-12-29 LAB — PROCALCITONIN: Procalcitonin: <0.1 ng/mL

## 2022-12-29 LAB — TSH: TSH: 1.607 u[IU]/mL (ref 0.350–4.500)

## 2022-12-29 LAB — PHOSPHORUS: Phosphorus: 3.4 mg/dL (ref 2.5–4.6)

## 2022-12-29 MED ORDER — IPRATROPIUM BROMIDE 0.06 % NA SOLN: 1.0000 | Freq: Two times a day (BID) | NASAL | Status: DC | PRN

## 2022-12-29 MED ORDER — SODIUM CHLORIDE 0.9% FLUSH
3.0000 mL | Freq: Two times a day (BID) | INTRAVENOUS | Status: DC
  Administered 2022-12-29 – 2022-12-30 (×3): 3 mL via INTRAVENOUS

## 2022-12-29 MED ORDER — APIXABAN 2.5 MG PO TABS
2.5000 mg | ORAL_TABLET | Freq: Two times a day (BID) | ORAL | Status: DC
  Administered 2022-12-29 – 2022-12-30 (×3): 2.5 mg via ORAL
  Filled 2022-12-29 (×3): qty 1

## 2022-12-29 MED ORDER — ZINC SULFATE 220 (50 ZN) MG PO CAPS
220.0000 mg | ORAL_CAPSULE | Freq: Every day | ORAL | Status: DC
  Administered 2022-12-29 – 2022-12-30 (×2): 220 mg via ORAL
  Filled 2022-12-29 (×2): qty 1

## 2022-12-29 MED ORDER — ROSUVASTATIN CALCIUM 5 MG PO TABS
2.5000 mg | ORAL_TABLET | Freq: Every day | ORAL | Status: DC
  Administered 2022-12-29 – 2022-12-30 (×2): 2.5 mg via ORAL
  Filled 2022-12-29 (×2): qty 1

## 2022-12-29 MED ORDER — MELATONIN 3 MG PO TABS
3.0000 mg | ORAL_TABLET | Freq: Every day | ORAL | Status: DC
  Administered 2022-12-29: 3 mg via ORAL
  Filled 2022-12-29: qty 1

## 2022-12-29 MED ORDER — ALBUTEROL SULFATE HFA 108 (90 BASE) MCG/ACT IN AERS
2.0000 | INHALATION_SPRAY | Freq: Four times a day (QID) | RESPIRATORY_TRACT | Status: DC
  Administered 2022-12-29 – 2022-12-30 (×5): 2 via RESPIRATORY_TRACT
  Filled 2022-12-29: qty 6.7

## 2022-12-29 MED ORDER — VITAMIN C 500 MG PO TABS
500.0000 mg | ORAL_TABLET | Freq: Every day | ORAL | Status: DC
  Administered 2022-12-29 – 2022-12-30 (×2): 500 mg via ORAL
  Filled 2022-12-29 (×2): qty 1

## 2022-12-29 MED ORDER — EZETIMIBE 10 MG PO TABS
10.0000 mg | ORAL_TABLET | Freq: Every day | ORAL | Status: DC
  Administered 2022-12-29 – 2022-12-30 (×2): 10 mg via ORAL
  Filled 2022-12-29 (×2): qty 1

## 2022-12-29 MED ORDER — GUAIFENESIN-DM 100-10 MG/5ML PO SYRP: 10.0000 mL | ORAL_SOLUTION | ORAL | Status: DC | PRN

## 2022-12-29 MED ORDER — LEVOTHYROXINE SODIUM 100 MCG PO TABS
100.0000 ug | ORAL_TABLET | Freq: Every day | ORAL | Status: DC
  Administered 2022-12-29 – 2022-12-30 (×2): 100 ug via ORAL
  Filled 2022-12-29 (×2): qty 1

## 2022-12-29 MED ORDER — FUROSEMIDE 10 MG/ML IJ SOLN
40.0000 mg | Freq: Two times a day (BID) | INTRAMUSCULAR | Status: DC
  Administered 2022-12-29 (×2): 40 mg via INTRAVENOUS
  Filled 2022-12-29 (×2): qty 4

## 2022-12-29 MED ORDER — POLYVINYL ALCOHOL 1.4 % OP SOLN
1.0000 [drp] | Freq: Two times a day (BID) | OPHTHALMIC | Status: DC
  Administered 2022-12-29 – 2022-12-30 (×3): 1 [drp] via OPHTHALMIC
  Filled 2022-12-29: qty 15

## 2022-12-29 MED ORDER — LORATADINE 10 MG PO TABS: 10.0000 mg | ORAL_TABLET | Freq: Two times a day (BID) | ORAL | Status: DC | PRN

## 2022-12-29 NOTE — Plan of Care (Signed)

## 2022-12-29 NOTE — ED Notes (Signed)
Pt report called to [facility], verbalized complete understanding of Pt plan of care and current condition, denies questions at this time. Care link advised transport would likely be after shift change.

## 2022-12-29 NOTE — ED Notes (Signed)
First attempt to call report to [facility] [address] [place] unsuccessful, secretary asked to try back in 64mn.

## 2022-12-29 NOTE — ED Notes (Signed)
Receiving facility called and given update on transportation. No additional questions or concerns at this time.

## 2022-12-29 NOTE — ED Notes (Signed)
Pt Report called to [facility]

## 2022-12-29 NOTE — Progress Notes (Signed)
  Echocardiogram 2D Echocardiogram has been performed.  Amy Fischer 12/29/2022, 12:46 PM

## 2022-12-29 NOTE — H&P (Signed)
History and Physical    Patient: Amy Fischer HCW:[id number] DOB: 27-Jul-1929 DOA: 12/28/2022 DOS: the patient was seen and examined on 12/29/2022 PCP: Burnis Medin, MD  Patient coming from: Transfer from [place]  Chief Complaint:  Chief Complaint  Patient presents with   Shortness of Breath   HPI: Amy Fischer is a 89 y.o. female with medical history significant of diastolic CHF, paroxysmal A-fib, hypothyroidism, hypertension, hyperlipidemia, mitral valve replacement who presented from home today with complaints of shortness of breath that woke her out of her sleep at around 6 AM. Patient reports that she does not have until later in the morning.  Sitting up seem to help make symptoms better.  Normally patient does not lay flat at baseline and sleeps with the head of the bed elevated.  She had been diagnosed with COVID the week before Christmas with at home test.  She had not been prescribed anything and has been doing symptomatic treatment at home.  Patient notes that she has had a productive cough that is basically been unchanged.  She did have diarrhea initially, but states the symptoms have since resolved.  Denies any significant fever, chest pain, palpitations, wheezing, nausea, vomiting, leg pain, or leg swelling. She had lost some weight recently, but attributed it to being sick with COVID and having decreased appetite.  She reports that she has not had significant pain from the compression fracture in her back and has been seen previously by Dr. Kathyrn Sheriff, but did not like wearing the brace.  In the emergency department patient noted to be afebrile, with O2 saturations maintained on room air.  Labs from 12/31 significant for BNP 365.9 and high-sensitivity troponin with flat trend at 67->66.  COVID-19 screening  positive.  CT angiogram of the chest noted increasing T12 compression fracture now 50% with new 4 mm bony repulsion since 09/19/2022 with narrowing of the central canal, no  signs of pulmonary embolism, chronic lung changes, coronary artery disease, and cardiomegaly with mitral valve replacement.  Patient had been given aspirin 324 mg p.o., Lasix 40 mg IV, and Decadron 10 mg IV.  Review of Systems: As mentioned in the history of present illness. All other systems reviewed and are negative. Past Medical History:  Diagnosis Date   Allergy    Anemia    Asymptomatic carotid artery stenosis    R ICA 40% stenosis on CTA 12/2011.   Bladder polyps    Cataract    BILATERAL-REMOVED   CHF (congestive heart failure) (HCC)    Diverticulosis    Elevated blood pressure 03/25/2011   Bp readings borderline today has hx of elvation  In office and ok at home   Not checke recently   She gfeels was elevated from anxiety     Episodic recurrent vertigo    MRI Head 2003   Esophageal spasm    GERD (gastroesophageal reflux disease)    Hepatic hemangioma    History of hepatitis    unknown type   Hyperlipidemia    Hyperplastic colon polyp    Hypothyroidism    Intestinal metaplasia of gastric mucosa    Osteoarthritis    Osteoporosis    Patellar fracture 07/13/2012   Scapular fracture 03/31/2012   small from direct blow with trip     Subclavian steal syndrome    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; CTA 2013 revealed open graft   Past Surgical History:  Procedure Laterality Date   APPENDECTOMY     CARDIAC  CATHETERIZATION N/A 10/04/2015   Procedure: Right/Left Heart Cath and Coronary Angiography;  Surgeon: Belva Crome, MD;  Location: [facility];  Service: Cardiovascular;  Laterality: N/A;   CAROTID-SUBCLAVIAN BYPASS GRAFT Left 1999   for Pinckney steal syndrome   CHOLECYSTECTOMY     COLONOSCOPY     CYSTECTOMY Left    hand   ELBOW SURGERY Left    KNEE SURGERY Left 2014   MITRAL VALVE REPAIR N/A 10/10/2015   Procedure: MITRAL VALVE REPAIR (MVR) WITH SIZE 28 CARPENTIER-EDWARDS PHYSIO II ANNULOPLASTY RING;  Surgeon: [name], MD;  Location: [facility];   Service: Open Heart Surgery;  Laterality: N/A;   ROTATOR CUFF REPAIR Left    TEAR DUCT PROBING  07/2014   TEE WITHOUT CARDIOVERSION N/A 10/03/2015   Procedure: TRANSESOPHAGEAL ECHOCARDIOGRAM (TEE);  Surgeon: Larey Dresser, MD;  Location: [facility];  Service: Cardiovascular;  Laterality: N/A;   TEE WITHOUT CARDIOVERSION N/A 10/10/2015   Procedure: TRANSESOPHAGEAL ECHOCARDIOGRAM (TEE);  Surgeon: [name], MD;  Location: [facility];  Service: Open Heart Surgery;  Laterality: N/A;   TONSILLECTOMY     TUBAL LIGATION     Social History:  reports that she quit smoking about 32 years ago. Her smoking use included cigarettes. She has a 15.00 pack-year smoking history. She has never used smokeless tobacco. She reports that she does not currently use alcohol after a past usage of about 4.0 standard drinks of alcohol per week. She reports that she does not use drugs.  Allergies  Allergen Reactions   Codeine Nausea And Vomiting   Risedronate Sodium     Upset stomach. Could take Fosamax.   Statins     Muscles hurt, can take low dose    Tape Other (See Comments)    Blisters, Please use "paper" tape   Amoxicillin-Pot Clavulanate Diarrhea    Not allergic  2014, Pt can take z pack only Not allergic  2014, Pt can take z pack only    Family History  Problem Relation Age of Onset   Hypertension Mother    Stroke Mother    Heart disease Father    Colon cancer Neg Hx    Neurofibromatosis Neg Hx    Allergic rhinitis Neg Hx    Asthma Neg Hx    Angioedema Neg Hx    Atopy Neg Hx    Eczema Neg Hx    Immunodeficiency Neg Hx    Urticaria Neg Hx     Prior to Admission medications   Medication Sig Start Date End Date Taking? Authorizing Provider  albuterol (VENTOLIN HFA) 108 (90 Base) MCG/ACT inhaler Inhale 2 puffs into the lungs every 6 (six) hours as needed for wheezing or shortness of breath. 12/24/21   Kozlow, Donnamarie Poag, MD  cholecalciferol (VITAMIN D) 1000 units tablet Take 1,000 Units by  mouth daily.    [provider]  ELIQUIS 2.5 MG TABS tablet TAKE 1 TABLET TWICE A DAY 05/12/22   Lelon Perla, MD  ezetimibe (ZETIA) 10 MG tablet TAKE 1 TABLET DAILY 08/11/22   Panosh, [name], MD  folic acid (FOLVITE) 1 MG tablet TAKE 1 TABLET DAILY 08/21/22   Panosh, [name], MD  ipratropium (ATROVENT) 0.06 % nasal spray USE 1 SPRAY IN EACH NOSTRIL TWICE A DAY AS NEEDED FOR RHINITIS Patient taking differently: Place 1 spray into both nostrils in the morning and at bedtime. 03/07/22   Panosh, [name], MD  ipratropium-albuterol (DUONEB) 0.5-2.5 (3) MG/3ML SOLN Take  3 mLs by nebulization every 6 (six) hours as needed. Patient not taking: Reported on 09/16/2022 12/24/21   Jiles Prows, MD  levothyroxine (SYNTHROID) 100 MCG tablet TAKE 1 TABLET EVERY MORNING ON AN EMPTY STOMACH 08/28/22   Panosh, [name], MD  loratadine (CLARITIN) 10 MG tablet Take 1 tablet (10 mg total) by mouth 2 (two) times daily as needed for allergies (Use an extra dose during flare ups.). Patient not taking: Reported on 09/27/2022 12/24/21   Jiles Prows, MD  Melatonin 3 MG TABS Take 3 mg by mouth at bedtime.     [provider]  MULTIPLE VITAMIN PO Take 1 tablet by mouth daily. 50+ Senior Vitamin Daily    [provider]  omeprazole (PRILOSEC) 40 MG capsule Take 1 capsule (40 mg total) by mouth daily. Patient not taking: Reported on 09/27/2022 12/24/21   Jiles Prows, MD  Polyethyl Glycol-Propyl Glycol (SYSTANE OP) Place 1 drop into both eyes 2 (two) times daily.    [provider]  rosuvastatin (CRESTOR) 5 MG tablet TAKE ONE-HALF (1/2) TABLET DAILY 07/09/22   Panosh, [name], MD  SALINE NASAL SPRAY NA Place 1 spray into both nostrils in the morning and at bedtime.    [provider]  vitamin B-12 (CYANOCOBALAMIN) 1000 MCG tablet Take 1,000 mcg by mouth daily.    [provider]    Physical Exam: Vitals:   12/29/22 0530 12/29/22 0715 12/29/22 0745 12/29/22 0835  BP:  132/77 (!) 114/57 111/72 131/84  Pulse: 67 66 64 64  Resp: '17 16 20 18  '$ Temp:  97.7 F (36.5 C)  98 F (36.7 C)  TempSrc:  Oral  Oral  SpO2: 96% 92% 98% 97%  Weight:      Height:       Exam  Constitutional: Elderly female who appears younger than stated age and recliner Eyes: PERRL, lids and conjunctivae normal ENMT: Mucous membranes are moist. Posterior pharynx clear of any exudate or lesions.  Neck: normal, supple, no JVD appreciated Respiratory: Normal respiratory effort with intermittent Rales appreciated.  No significant crackles or wheezes appreciated.  O2 saturations currently maintained on room air. Cardiovascular: Irregular irregular,  . No extremity edema. 2+ pedal pulses.  Abdomen: no tenderness, no masses palpated.   Bowel sounds positive.  Musculoskeletal:  No joint deformity upper and lower extremities. Good ROM, no contractures. Normal muscle tone.  Skin: no rashes, lesions, ulcers.  Some venous stasis changes noted of the lower extremities. Neurologic: CN 2-12 grossly intact.  Strength 5/5 in all 4.  Psychiatric: Normal judgment and insight. Alert and oriented x 3. Normal mood.   Data Reviewed:  EKG reveals sinus rhythm at 76 bpm with premature atrial complexes.  Reviewed labs, imaging, and pertinent records as noted above in HPI  Assessment and Plan:  Diastolic congestive heart failure exacerbation Acute on chronic.  Patient presents with complaints of  shortness of breath.    BNP elevated 365.9.  CT angiogram of the chest had noted cardiomegaly with chronic lung changes.  Last echocardiogram 55- 60% in 2019.  Patient has been given Lasix 40 mg IV x 1 dose in the emergency department. -Admit to a cardiac telemetry bed -Heart failure order set utilized -Strict I&Os and daily weights -Check echocardiogram -Give Lasix 40 mg IV x 1 additional dose. Reassess in a.m. and determine if able to possibly discharge home with p.o. Lasix  COVID-19 infection Patient  reports having a persistent cough.  COVID-19 infection initially  diagnosed with at home test the week during the week for Christmas.  Patient had not been treated with any medication at that time.  CT value 23.1.  Currently appears to be out of the window for treatment. -COVID-19 order set utilized -Airborne precautions -Incentive spirometry and flutter valve -Albuterol inhaler -Vitamin C and zinc -Antitussives as needed  Elevated troponin Acute.  Patient denies any complaints of chest pain.  High-sensitivity troponin 67-> 66 with flat trend to suggest symptoms likely secondary to demand in the setting of mild heart failure exacerbation.  T12 compression fracture Acute on chronic.  CT revealed T 12 superior endplate compression fracture 50% with new 4 mm bony retropulsion since 09/19/22.  The results were discussed with Dr. Shelba Flake of [facility] who noted patient could wear a brace, but given age/comfort and she is not having pain reasonable to defer and have her follow up outpatient with Dr. Kathyrn Sheriff.    -Continue outpatient follow-up as needed  Paroxysmal atrial fibrillation on chronic anticoagulation Patient currently appears to be in sinus rhythm. -Continue Eliquis  Hypothyroidism TSH 1.67 -Continue levothyroxine  Status post mitral valve repair   DVT prophylaxis: Eliquis Advance Care Planning:   Code Status: Full Code   Consults: Case discussed with Dr. Radford Pax over the phone who agreed with current management of the patient and did not see the need for the patient to be acutely seen in the hospital setting. Family Communication: Patient daughter updated at bedside  Severity of Illness: The appropriate patient status for this patient is INPATIENT. Inpatient status is judged to be reasonable and necessary in order to provide the required intensity of service to ensure the patient's safety. The patient's presenting symptoms, physical exam findings, and initial radiographic and laboratory  data in the context of their chronic comorbidities is felt to place them at high risk for further clinical deterioration. Furthermore, it is not anticipated that the patient will be medically stable for discharge from the hospital within 2 midnights of admission.   * I certify that at the point of admission it is my clinical judgment that the patient will require inpatient hospital care spanning beyond 2 midnights from the point of admission due to high intensity of service, high risk for further deterioration and high frequency of surveillance required.*  Author: Norval Morton, MD 12/29/2022 8:49 AM  For on call review [email or web address].

## 2022-12-29 NOTE — Progress Notes (Signed)
Completed walking pulse ox on patient. Patient sats 98% RA without ambulating. With ambulating patient dropped to low 90s. Patient did not complain of SOB, dizziness, or heart palpitations.

## 2022-12-30 DIAGNOSIS — I5033 Acute on chronic diastolic (congestive) heart failure: Secondary | ICD-10-CM | POA: Diagnosis not present

## 2022-12-30 LAB — BASIC METABOLIC PANEL
Anion gap: 14 (ref 5–15)
BUN: 25 mg/dL — ABNORMAL HIGH (ref 8–23)
CO2: 29 mmol/L (ref 22–32)
Calcium: 10.2 mg/dL (ref 8.9–10.3)
Chloride: 98 mmol/L (ref 98–111)
Creatinine, Ser: 1.1 mg/dL — ABNORMAL HIGH (ref 0.44–1.00)
GFR, Estimated: 47 mL/min — ABNORMAL LOW (ref >60)
Glucose, Bld: 97 mg/dL (ref 70–99)
Potassium: 3.7 mmol/L (ref 3.5–5.1)
Sodium: 141 mmol/L (ref 135–145)

## 2022-12-30 LAB — CBC
HCT: 41.6 % (ref 36.0–46.0)
Hemoglobin: 13.9 g/dL (ref 12.0–15.0)
MCH: 32.4 pg (ref 26.0–34.0)
MCHC: 33.4 g/dL (ref 30.0–36.0)
MCV: 97 fL (ref 80.0–100.0)
Platelets: 240 10*3/uL (ref 150–400)
RBC: 4.29 MIL/uL (ref 3.87–5.11)
RDW: 14 % (ref 11.5–15.5)
WBC: 7.1 10*3/uL (ref 4.0–10.5)
nRBC: 0 % (ref 0.0–0.2)

## 2022-12-30 MED ORDER — SALINE SPRAY 0.65 % NA SOLN
1.0000 | Freq: Two times a day (BID) | NASAL | Status: DC
  Administered 2022-12-30: 1 via NASAL
  Filled 2022-12-30 (×2): qty 44

## 2022-12-30 MED ORDER — FUROSEMIDE 20 MG PO TABS: 20.0000 mg | ORAL_TABLET | Freq: Every day | ORAL | 0 refills | Status: DC | PRN

## 2022-12-30 MED ORDER — PANTOPRAZOLE SODIUM 40 MG PO TBEC
40.0000 mg | DELAYED_RELEASE_TABLET | Freq: Every day | ORAL | Status: DC
  Administered 2022-12-30: 40 mg via ORAL
  Filled 2022-12-30: qty 1

## 2022-12-30 MED ORDER — FUROSEMIDE 20 MG PO TABS: 20.0000 mg | ORAL_TABLET | Freq: Every day | ORAL | Status: DC

## 2022-12-30 MED ORDER — POTASSIUM CHLORIDE CRYS ER 20 MEQ PO TBCR: 20.0000 meq | EXTENDED_RELEASE_TABLET | Freq: Every day | ORAL | 0 refills | Status: DC | PRN

## 2022-12-30 MED ORDER — FUROSEMIDE 20 MG PO TABS: 20.0000 mg | ORAL_TABLET | Freq: Every day | ORAL | Status: DC | PRN

## 2022-12-30 NOTE — Discharge Summary (Signed)
Physician Discharge Summary  Amy Fischer MHD:[id number] DOB: 1929-08-13 DOA: 12/28/2022  PCP: Burnis Medin, MD  Admit date: 12/28/2022 Discharge date: 12/30/2022  Admitted From: home Discharge disposition: home   Recommendations for Outpatient Follow-Up:  Resume home health Lasix added PRN -would encourage patient to weight self daily and call for increased weight -can follow up outpatient with Dr. Ralene Ok if needed   Discharge Diagnosis:   Principal Problem:   Acute on chronic diastolic congestive heart failure, NYHA class 3 ([place]) Active Problems:   COVID-19 virus infection   Elevated troponin   T12 compression fracture (H[facility])   Paroxysmal atrial fibrillation ([place])   Hypothyroidism   S/P MVR (mitral valve repair)   Chronic anticoagulation    Discharge Condition: Improved.  Diet recommendation: Low sodium, heart healthy.   Wound care: None.  Code status: Full.   History of Present Illness:    Amy Fischer is a 89 y.o. female with medical history significant of diastolic CHF, paroxysmal A-fib, hypothyroidism, hypertension, hyperlipidemia, mitral valve replacement who presented from home today with complaints of shortness of breath that woke her out of her sleep at around 6 AM. Patient reports that she does not have until later in the morning.  Sitting up seem to help make symptoms better.  Normally patient does not lay flat at baseline and sleeps with the head of the bed elevated.  She had been diagnosed with COVID the week before Christmas with at home test.  She had not been prescribed anything and has been doing symptomatic treatment at home.  Patient notes that she has had a productive cough that is basically been unchanged.  She did have diarrhea initially, but states the symptoms have since resolved.  Denies any significant fever, chest pain, palpitations, wheezing, nausea, vomiting, leg pain, or leg swelling. She had lost some weight recently, but  attributed it to being sick with COVID and having decreased appetite.  She reports that she has not had significant pain from the compression fracture in her back and has been seen previously by Dr. Kathyrn Sheriff, but did not like wearing the brace.   In the emergency department patient noted to be afebrile, with O2 saturations maintained on room air.  Labs from 12/31 significant for BNP 365.9 and high-sensitivity troponin with flat trend at 67->66.  COVID-19 screening  positive.  CT angiogram of the chest noted increasing T12 compression fracture now 50% with new 4 mm bony repulsion since 09/19/2022 with narrowing of the central canal, no signs of pulmonary embolism, chronic lung changes, coronary artery disease, and cardiomegaly with mitral valve replacement.  Patient had been given aspirin 324 mg p.o., Lasix 40 mg IV, and Decadron 10 mg IV.   Hospital Course by Problem:   Diastolic congestive heart failure exacerbation Acute on chronic.  Patient presents with complaints of  shortness of breath.    BNP elevated 365.9.  CT angiogram of the chest had noted cardiomegaly with chronic lung changes.  Last echocardiogram 55- 60% in 2019.  Patient has been given Lasix 40 mg IV x 1 dose in the emergency department. -moving well off O2- did not get strict I/Os or daily weights -PRN lasix at home -daily weights   COVID-19 infection Patient reports having a persistent cough.  COVID-19 infection initially diagnosed with at home test the week during the week for Christmas.  Patient had not been treated with any medication at that time.  CT value 23.1.  Currently appears  to be out of the window for treatment. -Incentive spirometry and flutter valve -Albuterol inhaler -Vitamin C and zinc -Antitussives as needed   Elevated troponin Acute.  Patient denies any complaints of chest pain.  High-sensitivity troponin 67-> 66 with flat trend to suggest symptoms likely secondary to demand in the setting of mild heart  failure exacerbation.   T12 compression fracture Acute on chronic.  CT revealed T 12 superior endplate compression fracture 50% with new 4 mm bony retropulsion since 09/19/22.  The results were discussed with Dr. Shelba Flake of [facility] who noted patient could wear a brace, but given age/comfort and she is not having pain reasonable to defer and have her follow up outpatient with Dr. Kathyrn Sheriff.    -Continue outpatient follow-up as needed   Paroxysmal atrial fibrillation on chronic anticoagulation Patient currently appears to be in sinus rhythm. -Continue Eliquis   Hypothyroidism TSH 1.67 -Continue levothyroxine   Status post mitral valve repair        Medical Consultants:      Discharge Exam:   Vitals:   12/29/22 1920 12/30/22 0759  BP: (!) 111/55 129/80  Pulse: 78 73  Resp: 16 15  Temp: 97.8 F (36.6 C) 97.8 F (36.6 C)  SpO2: 98% 95%   Vitals:   12/29/22 0745 12/29/22 0835 12/29/22 1920 12/30/22 0759  BP: 111/72 131/84 (!) 111/55 129/80  Pulse: 64 64 78 73  Resp: '20 18 16 15  '$ Temp:  98 F (36.7 C) 97.8 F (36.6 C) 97.8 F (36.6 C)  TempSrc:  Oral Oral Oral  SpO2: 98% 97% 98% 95%  Weight:      Height:        General exam: Appears calm and comfortable.    The results of significant diagnostics from this hospitalization (including imaging, microbiology, ancillary and laboratory) are listed below for reference.     Procedures and Diagnostic Studies:   ECHOCARDIOGRAM COMPLETE  Result Date: 12/29/2022    ECHOCARDIOGRAM REPORT   Patient Name:   Amy Fischer Date of Exam: 12/29/2022 Medical Rec #:  [id number]       Height:       61.0 in Accession #:    [id number]      Weight:       105.0 lb Date of Birth:  1929-07-17        BSA:          1.436 m Patient Age:    8 years        BP:           131/84 mmHg Patient Gender: F               HR:           79 bpm. Exam Location:  Inpatient Procedure: 2D Echo, Color Doppler and Cardiac Doppler Indications:    CHF - Acute Diastolic   History:        Patient has prior history of Echocardiogram examinations, most                 recent 04/20/2018. CHF, Arrythmias:Atrial Fibrillation,                 Signs/Symptoms:Shortness of Breath; Risk Factors:Hypertension                 and Dyslipidemia.                  Mitral Valve: 28 mm Carpentier-Edwards Physio II prosthetic  annuloplasty ring valve is present in the mitral position.                 Procedure Date: 10/10/2015.  Sonographer:    Eartha Inch Referring Phys: [id number] RONDELL A SMITH  Sonographer Comments: Technically difficult study due to poor echo windows. Image acquisition challenging due to patient body habitus and Image acquisition challenging due to respiratory motion. IMPRESSIONS  1. Left ventricular ejection fraction, by estimation, is 65 to 70%. The left ventricle has normal function. The left ventricle has no regional wall motion abnormalities. There is mild left ventricular hypertrophy. Left ventricular diastolic parameters are indeterminate.  2. Right ventricular systolic function is mildly reduced. The right ventricular size is normal. There is mildly elevated pulmonary artery systolic pressure. The estimated right ventricular systolic pressure is 86.5 mmHg.  3. Right atrial size was mildly dilated.  4. The mitral valve has been repaired. Mild mitral valve regurgitation. Mild mitral stenosis. The mean mitral valve gradient is 5.0 mmHg with average heart rate of 77 bpm. There is a 28 mm Carpentier-Edwards Physio II prosthetic annuloplasty ring present in the mitral position. Procedure Date: 10/10/2015.  5. Tricuspid valve regurgitation is mild to moderate.  6. The aortic valve is tricuspid. Aortic valve regurgitation is not visualized. No aortic stenosis is present.  7. The inferior vena cava is normal in size with greater than 50% respiratory variability, suggesting right atrial pressure of 3 mmHg. FINDINGS  Left Ventricle: Left ventricular ejection fraction,  by estimation, is 65 to 70%. The left ventricle has normal function. The left ventricle has no regional wall motion abnormalities. The left ventricular internal cavity size was small. There is mild left ventricular hypertrophy. Left ventricular diastolic parameters are indeterminate. Right Ventricle: The right ventricular size is normal. Right vetricular wall thickness was not well visualized. Right ventricular systolic function is mildly reduced. There is mildly elevated pulmonary artery systolic pressure. The tricuspid regurgitant velocity is 2.83 m/s, and with an assumed right atrial pressure of 3 mmHg, the estimated right ventricular systolic pressure is 78.4 mmHg. Left Atrium: Left atrial size was normal in size. Right Atrium: Right atrial size was mildly dilated. Pericardium: There is no evidence of pericardial effusion. Mitral Valve: The mitral valve has been repaired/replaced. Mild mitral valve regurgitation. There is a 28 mm Carpentier-Edwards Physio II prosthetic annuloplasty ring present in the mitral position. Procedure Date: 10/10/2015. Mild mitral valve stenosis.  MV peak gradient, 9.9 mmHg. The mean mitral valve gradient is 5.0 mmHg with average heart rate of 77 bpm. Tricuspid Valve: The tricuspid valve is normal in structure. Tricuspid valve regurgitation is mild to moderate. Aortic Valve: The aortic valve is tricuspid. Aortic valve regurgitation is not visualized. No aortic stenosis is present. Pulmonic Valve: The pulmonic valve was not well visualized. Pulmonic valve regurgitation is not visualized. Aorta: The aortic root and ascending aorta are structurally normal, with no evidence of dilitation. Venous: The inferior vena cava is normal in size with greater than 50% respiratory variability, suggesting right atrial pressure of 3 mmHg. IAS/Shunts: The interatrial septum was not well visualized.  LEFT VENTRICLE PLAX 2D LVIDd:         3.50 cm   Diastology LVIDs:         2.30 cm   LV e' medial:  4.13  cm/s LV PW:         0.90 cm   LV e' lateral: 6.64 cm/s LV IVS:        1.20 cm LVOT diam:  2.00 cm LV SV:         52 LV SV Index:   36 LVOT Area:     3.14 cm  RIGHT VENTRICLE            IVC RV S prime:     9.03 cm/s  IVC diam: 1.30 cm TAPSE (M-mode): 1.7 cm LEFT ATRIUM           Index        RIGHT ATRIUM           Index LA diam:      4.20 cm 2.92 cm/m   RA Area:     15.60 cm LA Vol (A4C): 43.4 ml 30.22 ml/m  RA Volume:   43.50 ml  30.29 ml/m  AORTIC VALVE LVOT Vmax:   74.30 cm/s LVOT Vmean:  50.400 cm/s LVOT VTI:    0.164 m  AORTA Ao Root diam: 2.80 cm Ao Asc diam:  2.60 cm MITRAL VALVE              TRICUSPID VALVE MV Area VTI:  0.86 cm    TR Peak grad:   32.0 mmHg MV Peak grad: 9.9 mmHg    TR Mean grad:   23.0 mmHg MV Mean grad: 5.0 mmHg    TR Vmax:        283.00 cm/s MV Vmax:      1.57 m/s    TR Vmean:       231.0 cm/s MV Vmean:     89.6 cm/s MR Peak grad: 57.9 mmHg   SHUNTS MR Vmax:      380.50 cm/s Systemic VTI:  0.16 m                           Systemic Diam: 2.00 cm Oswaldo Milian MD Electronically signed by Oswaldo Milian MD Signature Date/Time: 12/29/2022/1:05:38 PM    Final    CT Angio Chest PE W and/or Wo Contrast  Result Date: 12/28/2022 CLINICAL DATA:  89 year old female with shortness of breath. Recent COVID positive. EXAM: CT ANGIOGRAPHY CHEST WITH CONTRAST TECHNIQUE: Multidetector CT imaging of the chest was performed using the standard protocol during bolus administration of intravenous contrast. Multiplanar CT image reconstructions and MIPs were obtained to evaluate the vascular anatomy. RADIATION DOSE REDUCTION: This exam was performed according to the departmental dose-optimization program which includes automated exposure control, adjustment of the mA and/or kV according to patient size and/or use of iterative reconstruction technique. CONTRAST:  7m OMNIPAQUE IOHEXOL 350 MG/ML SOLN COMPARISON:  09/19/2022 thoracic/lumbar spine CT and 04/05/2022 CT FINDINGS:  Cardiovascular: Satisfactory opacification of the pulmonary arteries to the segmental level. No evidence of pulmonary embolism. Cardiomegaly and mitral valve replacement again noted. Aortic and coronary artery atherosclerotic calcifications again identified. There is no evidence of thoracic aortic aneurysm or pericardial effusion. Mediastinum/Nodes: No mediastinal mass or suspicious lymph nodes. Calcified mediastinal/hilar lymph nodes are present. The visualized trachea and esophagus are unremarkable. Lungs/Pleura: Unchanged subpleural reticulation and scarring bilaterally. No evidence of airspace disease, consolidation, mass, suspicious nodule, pleural effusion or pneumothorax. Upper Abdomen: No acute abnormality. Musculoskeletal: Increasing T12 SUPERIOR endplate compression fracture now 50%. 4 mm bony retropulsion is now noted narrowing the central canal. Review of the MIP images confirms the above findings. IMPRESSION: 1. Increasing T12 SUPERIOR endplate compression fracture now 50%, with new 4 mm bony retropulsion since 09/19/2022 narrowing the central canal. 2. No evidence of pulmonary emboli or thoracic aortic aneurysm. 3. Unchanged  chronic lung findings. 4. Cardiomegaly, mitral valve replacement and coronary artery disease. 5.  Aortic Atherosclerosis (ICD10-I70.0). Electronically Signed   By: Margarette Canada M.D.   On: 12/28/2022 15:05   DG Chest Portable 1 View  Result Date: 12/28/2022 CLINICAL DATA:  89 year old female with history of dyspnea. Shortness of breath. Generalized malaise. EXAM: PORTABLE CHEST 1 VIEW COMPARISON:  Chest x-ray 11/04/2021. FINDINGS: Lung volumes are increased with emphysematous changes, diffuse interstitial prominence and widespread peribronchial cuffing, similar to prior studies, likely reflecting chronic bronchitis in the setting of COPD. No consolidative airspace disease. No pleural effusions. No pneumothorax. No pulmonary nodule or mass noted. Pulmonary vasculature is normal.  Heart size is mildly enlarged. Upper mediastinal contours are within normal limits. Atherosclerotic calcifications in the thoracic aorta. Status post median sternotomy for mitral annuloplasty. Old healed fracture of the lateral aspect of the left seventh rib incidentally noted. IMPRESSION: 1. No radiographic evidence of acute cardiopulmonary disease. 2. Chronic changes suggestive of COPD redemonstrated, as above. 3. Mild cardiomegaly. 4. Aortic atherosclerosis. Electronically Signed   By: Vinnie Langton M.D.   On: 12/28/2022 10:53     Labs:   Basic Metabolic Panel: Recent Labs  Lab 12/28/22 1026 12/29/22 1034 12/30/22 0421  NA 142 140 141  K 3.8 4.1 3.7  CL 106 103 98  CO2 '27 28 29  '$ GLUCOSE 93 104* 97  BUN 13 13 25*  CREATININE 0.71 0.90 1.10*  CALCIUM 9.6 9.9 10.2  MG  --  2.0  --   PHOS  --  3.4  --    GFR Estimated Creatinine Clearance: 25 mL/min (A) (by C-G formula based on SCr of 1.1 mg/dL (H)). Liver Function Tests: Recent Labs  Lab 12/28/22 1026  AST 16  ALT 14  ALKPHOS 53  BILITOT 0.7  PROT 6.3*  ALBUMIN 3.6   No results for input(s): "LIPASE", "AMYLASE" in the last 168 hours. No results for input(s): "AMMONIA" in the last 168 hours. Coagulation profile No results for input(s): "INR", "PROTIME" in the last 168 hours.  CBC: Recent Labs  Lab 12/28/22 1026 12/30/22 0421  WBC 8.6 7.1  NEUTROABS 6.6  --   HGB 13.2 13.9  HCT 38.6 41.6  MCV 98.0 97.0  PLT 186 240   Cardiac Enzymes: No results for input(s): "CKTOTAL", "CKMB", "CKMBINDEX", "TROPONINI" in the last 168 hours. BNP: Invalid input(s): "POCBNP" CBG: No results for input(s): "GLUCAP" in the last 168 hours. D-Dimer No results for input(s): "DDIMER" in the last 72 hours. Hgb A1c No results for input(s): "HGBA1C" in the last 72 hours. Lipid Profile No results for input(s): "CHOL", "HDL", "LDLCALC", "TRIG", "CHOLHDL", "LDLDIRECT" in the last 72 hours. Thyroid function studies Recent Labs     12/29/22 1034  TSH 1.607   Anemia work up No results for input(s): "VITAMINB12", "FOLATE", "FERRITIN", "TIBC", "IRON", "RETICCTPCT" in the last 72 hours. Microbiology Recent Results (from the past 240 hour(s))  Resp panel by RT-PCR (RSV, Flu A&B, Covid) Anterior Nasal Swab     Status: Abnormal   Collection Time: 12/28/22 10:27 AM   Specimen: Anterior Nasal Swab  Result Value Ref Range Status   SARS Coronavirus 2 by RT PCR POSITIVE (A) NEGATIVE Final    Comment: (NOTE) SARS-CoV-2 target nucleic acids are DETECTED.  The SARS-CoV-2 RNA is generally detectable in upper respiratory specimens during the acute phase of infection. Positive results are indicative of the presence of the identified virus, but do not rule out bacterial infection or co-infection with other  pathogens not detected by the test. Clinical correlation with patient history and other diagnostic information is necessary to determine patient infection status. The expected result is Negative.  Fact Sheet for Patients: EntrepreneurPulse.com.au  Fact Sheet for Healthcare Providers: IncredibleEmployment.be  This test is not yet approved or cleared by the [place] FDA and  has been authorized for detection and/or diagnosis of SARS-CoV-2 by FDA under an Emergency Use Authorization (EUA).  This EUA will remain in effect (meaning this test can be used) for the duration of  the COVID-19 declaration under Section 564(b)(1) of the A ct, 21 U.S.C. section 360bbb-3(b)(1), unless the authorization is terminated or revoked sooner.     Influenza A by PCR NEGATIVE NEGATIVE Final   Influenza B by PCR NEGATIVE NEGATIVE Final    Comment: (NOTE) The Xpert Xpress SARS-CoV-2/FLU/RSV plus assay is intended as an aid in the diagnosis of influenza from Nasopharyngeal swab specimens and should not be used as a sole basis for treatment. Nasal washings and aspirates are unacceptable for Xpert Xpress  SARS-CoV-2/FLU/RSV testing.  Fact Sheet for Patients: EntrepreneurPulse.com.au  Fact Sheet for Healthcare Providers: IncredibleEmployment.be  This test is not yet approved or cleared by the [place] FDA and has been authorized for detection and/or diagnosis of SARS-CoV-2 by FDA under an Emergency Use Authorization (EUA). This EUA will remain in effect (meaning this test can be used) for the duration of the COVID-19 declaration under Section 564(b)(1) of the Act, 21 U.S.C. section 360bbb-3(b)(1), unless the authorization is terminated or revoked.     Resp Syncytial Virus by PCR NEGATIVE NEGATIVE Final    Comment: (NOTE) Fact Sheet for Patients: EntrepreneurPulse.com.au  Fact Sheet for Healthcare Providers: IncredibleEmployment.be  This test is not yet approved or cleared by the [place] FDA and has been authorized for detection and/or diagnosis of SARS-CoV-2 by FDA under an Emergency Use Authorization (EUA). This EUA will remain in effect (meaning this test can be used) for the duration of the COVID-19 declaration under Section 564(b)(1) of the Act, 21 U.S.C. section 360bbb-3(b)(1), unless the authorization is terminated or revoked.  Performed at [facility], [address], [place], [place] [address]      Discharge Instructions:   Discharge Instructions     ([place]) Call MD:  Anytime you have any of the following symptoms: 1) 3 pound weight gain in 24 hours or 5 pounds in 1 week 2) shortness of breath, with or without a dry hacking cough 3) swelling in the hands, feet or stomach 4) if you have to sleep on extra pillows at night in order to breathe.   Complete by: As directed    Diet - low sodium heart healthy   Complete by: As directed    Heart Failure patients record your daily weight using the same scale at the same time of day   Complete by: As  directed    Increase activity slowly   Complete by: As directed       Allergies as of 12/30/2022       Reactions   Codeine Nausea And Vomiting   Risedronate Sodium    Upset stomach. Could take Fosamax.   Statins    Muscles hurt, can take low dose   Tape Other (See Comments)   Blisters, Please use "paper" tape   Amoxicillin-pot Clavulanate Diarrhea   Not allergic  2014, Pt can take z pack only        Medication List     TAKE  these medications    albuterol 108 (90 Base) MCG/ACT inhaler Commonly known as: VENTOLIN HFA Inhale 2 puffs into the lungs every 6 (six) hours as needed for wheezing or shortness of breath.   cholecalciferol 1000 units tablet Commonly known as: VITAMIN D Take 1,000 Units by mouth daily.   cyanocobalamin 1000 MCG tablet Commonly known as: VITAMIN B12 Take 1,000 mcg by mouth daily.   Eliquis 2.5 MG Tabs tablet Generic drug: apixaban TAKE 1 TABLET TWICE A DAY   ezetimibe 10 MG tablet Commonly known as: ZETIA TAKE 1 TABLET DAILY   folic acid 1 MG tablet Commonly known as: FOLVITE TAKE 1 TABLET DAILY   furosemide 20 MG tablet Commonly known as: LASIX Take 1 tablet (20 mg total) by mouth daily as needed for fluid.   ipratropium 0.06 % nasal spray Commonly known as: ATROVENT USE 1 SPRAY IN EACH NOSTRIL TWICE A DAY AS NEEDED FOR RHINITIS   ipratropium-albuterol 0.5-2.5 (3) MG/3ML Soln Commonly known as: DUONEB Take 3 mLs by nebulization every 6 (six) hours as needed.   levothyroxine 100 MCG tablet Commonly known as: SYNTHROID TAKE 1 TABLET EVERY MORNING ON AN EMPTY STOMACH   loratadine 10 MG tablet Commonly known as: Claritin Take 1 tablet (10 mg total) by mouth 2 (two) times daily as needed for allergies (Use an extra dose during flare ups.).   melatonin 3 MG Tabs tablet Take 3 mg by mouth at bedtime.   omeprazole 40 MG capsule Commonly known as: PRILOSEC Take 1 capsule (40 mg total) by mouth daily.   Polyethyl Glycol-Propyl  Glycol 0.4-0.3 % Soln Place 1 drop into both eyes 2 (two) times daily.   potassium chloride SA 20 MEQ tablet Commonly known as: KLOR-CON M Take 1 tablet (20 mEq total) by mouth daily as needed (when taking lasix).   rosuvastatin 5 MG tablet Commonly known as: CRESTOR TAKE ONE-HALF (1/2) TABLET DAILY   SALINE NASAL SPRAY NA Place 1 spray into both nostrils in the morning and at bedtime.        Follow-up Information     Panosh, [name], MD Follow up.   Specialties: Internal Medicine, Pediatrics Contact information: [place] [address] [phone]                  Time coordinating discharge: 45 min  Signed:  Geradine Girt DO  [facility] 12/30/2022, 10:53 AM

## 2022-12-30 NOTE — Plan of Care (Signed)
  Problem: Education: Goal: Knowledge of General Education information will improve Description: Including pain rating scale, medication(s)/side effects and non-pharmacologic comfort measures Outcome: Progressing   Problem: Clinical Measurements: Goal: Respiratory complications will improve Outcome: Progressing   Problem: Clinical Measurements: Goal: Cardiovascular complication will be avoided Outcome: Progressing   Problem: Activity: Goal: Risk for activity intolerance will decrease Outcome: Progressing   

## 2022-12-30 NOTE — Progress Notes (Signed)
Heart Failure Navigator Progress Note  Assessed for Heart & Vascular TOC clinic readiness.  Patient wishes to return to Dr. Stanford Breed for her Cardiology appointments vs coming for a HF TOC appointment. Plans for discharge today 12/30/2022. .   Navigator will sign off at this time. Earnestine Leys, BSN, RN Heart Failure Transport planner Only

## 2022-12-30 NOTE — Evaluation (Signed)
Physical Therapy Evaluation Patient Details Name: Amy Fischer MRN: [id number] DOB: 08-23-1929 Today's Date: 12/30/2022  History of Present Illness  Pt is a 89 y/o female who presents with SOB. She tested positive for Covid via home test 12/19/22. PMH signficant for CHF, diverticulosis, vertigo, hepatic hemangioma, hepatitis, hypothyroidism, OA, osteoporosis, patellar fx 2013, scap fracture 2013, subclavian steal syndrome, L elbow surgery, L knee surgery 2014, mitral valve repair 2016, L rotator cuff repair.   Clinical Impression  Pt admitted with above diagnosis. Pt currently with functional limitations due to the deficits listed below (see PT Problem List). At the time of PT eval pt was able to perform transfers and ambulation with gross supervision for safety and RW for support as she typically uses at home. Pt on RA throughout session and O2 sats grossly 93-99% throughout gait training, where pt ambulated ~300'. No DOE noted throughout session. Pt will benefit from skilled PT to increase their independence and safety with mobility to allow discharge to the venue listed below.          Recommendations for follow up therapy are one component of a multi-disciplinary discharge planning process, led by the attending physician.  Recommendations may be updated based on patient status, additional functional criteria and insurance authorization.  Follow Up Recommendations Home health PT (Resume)      Assistance Recommended at Discharge PRN  Patient can return home with the following  A little help with walking and/or transfers;A little help with bathing/dressing/bathroom;Assistance with cooking/housework;Assist for transportation    Equipment Recommendations None recommended by PT  Recommendations for Other Services       Functional Status Assessment Patient has had a recent decline in their functional status and demonstrates the ability to make significant improvements in function in a  reasonable and predictable amount of time.     Precautions / Restrictions Precautions Precautions: Fall Precaution Comments: Compression fracture T12 in September Required Braces or Orthoses: Spinal Brace Spinal Brace: Thoracolumbosacral orthotic (For comfort? Pt reports not wearing as "pain is not bad") Restrictions Weight Bearing Restrictions: No      Mobility  Bed Mobility               General bed mobility comments: Pt was received sitting up EOB when PT arrived.    Transfers Overall transfer level: Needs assistance Equipment used: Rolling walker (2 wheels) Transfers: Sit to/from Stand Sit to Stand: Supervision           General transfer comment: Increased time but pt was able to transition to/from stand without assistance. VCs for hand placement on seated surface for safety.    Ambulation/Gait Ambulation/Gait assistance: Supervision Gait Distance (Feet): 300 Feet Assistive device: Rolling walker (2 wheels) Gait Pattern/deviations: Step-through pattern, Decreased stride length, Trunk flexed, Narrow base of support Gait velocity: Decreased Gait velocity interpretation: <1.31 ft/sec, indicative of household ambulator   General Gait Details: Slow but generally steady with RW for support. No assist required and no overt LOB noted. Pt on RA throughout gait training and pt grossly remained >93% throughout. Occasionally dropped to 89% but with poor waveform on pulse-ox during these times. No DOE noted.  Stairs            Wheelchair Mobility    Modified Rankin (Stroke Patients Only)       Balance Overall balance assessment: Needs assistance Sitting-balance support: Feet supported, No upper extremity supported Sitting balance-Leahy Scale: Fair     Standing balance support: No upper extremity supported, During functional  activity Standing balance-Leahy Scale: Fair Standing balance comment: Pt able to stand at the sink and wash face, front and backside  with a washcloth.                             Pertinent Vitals/Pain Pain Assessment Pain Assessment: No/denies pain    Home Living Family/patient expects to be discharged to:: Private residence Living Arrangements: Alone Available Help at Discharge: Family;Available 24 hours/day Type of Home: Apartment Home Access: Level entry       Home Layout: One level Home Equipment: Conservation officer, nature (2 wheels);Rollator (4 wheels);Shower seat;Grab bars - tub/shower Additional Comments: pt's bed HOB raises, Has been having HHPT 1 x week for balance    Prior Function Prior Level of Function : Independent/Modified Independent;History of Falls (last six months)             Mobility Comments: Uses RW all the time, does not like rollator       Hand Dominance   Dominant Hand: Right    Extremity/Trunk Assessment   Upper Extremity Assessment Upper Extremity Assessment: Overall WFL for tasks assessed    Lower Extremity Assessment Lower Extremity Assessment: Overall WFL for tasks assessed    Cervical / Trunk Assessment Cervical / Trunk Assessment: Kyphotic  Communication   Communication: No difficulties  Cognition Arousal/Alertness: Awake/alert Behavior During Therapy: WFL for tasks assessed/performed Overall Cognitive Status: Within Functional Limits for tasks assessed                                          General Comments      Exercises     Assessment/Plan    PT Assessment Patient needs continued PT services  PT Problem List Decreased strength;Decreased range of motion;Decreased activity tolerance;Decreased balance;Decreased mobility;Decreased knowledge of use of DME;Decreased safety awareness;Decreased knowledge of precautions;Pain       PT Treatment Interventions DME instruction;Stair training;Gait training;Functional mobility training;Therapeutic activities;Therapeutic exercise;Balance training;Patient/family education    PT Goals  (Current goals can be found in the Care Plan section)  Acute Rehab PT Goals Patient Stated Goal: Home today PT Goal Formulation: With patient Time For Goal Achievement: 01/06/23 Potential to Achieve Goals: Good    Frequency Min 3X/week     Co-evaluation               AM-PAC PT "6 Clicks" Mobility  Outcome Measure Help needed turning from your back to your side while in a flat bed without using bedrails?: None Help needed moving from lying on your back to sitting on the side of a flat bed without using bedrails?: None Help needed moving to and from a bed to a chair (including a wheelchair)?: A Little Help needed standing up from a chair using your arms (e.g., wheelchair or bedside chair)?: A Little Help needed to walk in hospital room?: A Little Help needed climbing 3-5 steps with a railing? : A Little 6 Click Score: 20    End of Session Equipment Utilized During Treatment: Gait belt Activity Tolerance: Patient tolerated treatment well Patient left: with call bell/phone within reach;in chair;with chair alarm set;with family/visitor present Nurse Communication: Mobility status PT Visit Diagnosis: Unsteadiness on feet (R26.81);History of falling (Z91.81)    Time: 0940-1010 PT Time Calculation (min) (ACUTE ONLY): 30 min   Charges:   PT Evaluation $PT Eval Moderate Complexity: 1 Mod  PT Treatments $Gait Training: 8-22 mins        Rolinda Roan, PT, DPT Acute Rehabilitation Services Secure Chat Preferred Office: [phone]   Thelma Comp 12/30/2022, 10:28 AM

## 2022-12-30 NOTE — TOC Transition Note (Addendum)
Transition of Care [facility]) - CM/SW Discharge Note   Patient Details  Name: Amy Fischer MRN: [id number] Date of Birth: 07-Jul-1929  [facility] CM/SW Contact:  Sharin Mons, RN Phone Number: 12/30/2022, 12:10 PM   Clinical Narrative:    Patient will DC to: home Anticipated DC date: 12/30/2022 Family notified: yes Transport by: car    Presented with SOB, positive COVID. Per MD patient ready for DC today. RN, patient, and patient's daughter aware of DC. Pt states lives alone, however, daughter will assist with care one d/c. States PTA received HHPT services. States can't remember provider  name or #. States will contact provider once she's home and # accessible. Pt without DME needs, owns 2 RW. Pt without RX med concerns. States daughter ti provide transportation to home. Post hospital f/u noted on AVS. Pt states understates to obtain PCP appointment post d/c. [name] Daughter [phone]  RNCM will sign off for now as intervention is no longer needed. Please consult [place] again if new needs arise.   Final next level of care: [place] Barriers to Discharge: No Barriers Identified   Patient Goals and CMS Choice      Discharge Placement                         Discharge Plan and Services Additional resources added to the After Visit Summary for                                       Social Determinants of Health (SDOH) Interventions SDOH Screenings   Food Insecurity: No Food Insecurity (12/29/2022)  Housing: Low Risk  (12/29/2022)  Transportation Needs: No Transportation Needs (12/29/2022)  Utilities: Not At Risk (12/29/2022)  Depression (PHQ2-9): Low Risk  (09/16/2022)  Financial Resource Strain: Low Risk  (09/16/2022)  Physical Activity: Sufficiently Active (09/16/2022)  Social Connections: Moderately Integrated (09/03/2021)  Stress: No Stress Concern Present (09/16/2022)  Tobacco Use: Medium Risk (12/28/2022)      Readmission Risk Interventions     No data to display

## 2022-12-31 ENCOUNTER — Telehealth: Payer: Self-pay

## 2022-12-31 NOTE — Patient Outreach (Signed)
  Care Coordination TOC Note Transition Care Management Unsuccessful Follow-up Telephone Call  Date of discharge and from where:  12/30/22-[place]   Attempts:  1st Attempt  Reason for unsuccessful TCM follow-up call:  Left voice message   Hetty Blend [place] Management Telephonic Care Management Coordinator Direct Phone: [phone] Toll Free: [phone] Fax: [phone]

## 2023-01-01 ENCOUNTER — Telehealth: Payer: Self-pay

## 2023-01-01 NOTE — Patient Outreach (Signed)
  Care Coordination TOC Note Transition Care Management Unsuccessful Follow-up Telephone Call  Date of discharge and from where:  12/30/22-  Attempts:  2nd Attempt  Reason for unsuccessful TCM follow-up call:  No answer/busy     Enzo Montgomery, RN,BSN,CCM [place] Management Telephonic Care Management Coordinator Direct Phone: [phone] Toll Free: [phone] Fax: [phone]'

## 2023-01-02 ENCOUNTER — Telehealth: Payer: Self-pay

## 2023-01-02 NOTE — Patient Outreach (Signed)
  Care Coordination TOC Note Transition Care Management Unsuccessful Follow-up Telephone Call  Date of discharge and from where:  12/30/22-[place]  Attempts:  3rd Attempt  Reason for unsuccessful TCM follow-up call:  Unable to reach patient    Hetty Blend [place] Management Telephonic Care Management Coordinator Direct Phone: [phone] Toll Free: [phone] Fax: [phone]

## 2023-01-08 DIAGNOSIS — R278 Other lack of coordination: Secondary | ICD-10-CM | POA: Diagnosis not present

## 2023-01-08 DIAGNOSIS — M1990 Unspecified osteoarthritis, unspecified site: Secondary | ICD-10-CM | POA: Diagnosis not present

## 2023-01-08 DIAGNOSIS — G629 Polyneuropathy, unspecified: Secondary | ICD-10-CM | POA: Diagnosis not present

## 2023-01-08 DIAGNOSIS — Z9181 History of falling: Secondary | ICD-10-CM | POA: Diagnosis not present

## 2023-01-13 DIAGNOSIS — R278 Other lack of coordination: Secondary | ICD-10-CM | POA: Diagnosis not present

## 2023-01-13 DIAGNOSIS — Z9181 History of falling: Secondary | ICD-10-CM | POA: Diagnosis not present

## 2023-01-13 DIAGNOSIS — M1990 Unspecified osteoarthritis, unspecified site: Secondary | ICD-10-CM | POA: Diagnosis not present

## 2023-01-13 DIAGNOSIS — G629 Polyneuropathy, unspecified: Secondary | ICD-10-CM | POA: Diagnosis not present

## 2023-01-19 ENCOUNTER — Ambulatory Visit: Payer: Medicare Other | Admitting: Internal Medicine

## 2023-01-20 DIAGNOSIS — G629 Polyneuropathy, unspecified: Secondary | ICD-10-CM | POA: Diagnosis not present

## 2023-01-20 DIAGNOSIS — Z9181 History of falling: Secondary | ICD-10-CM | POA: Diagnosis not present

## 2023-01-20 DIAGNOSIS — M1990 Unspecified osteoarthritis, unspecified site: Secondary | ICD-10-CM | POA: Diagnosis not present

## 2023-01-20 DIAGNOSIS — R278 Other lack of coordination: Secondary | ICD-10-CM | POA: Diagnosis not present

## 2023-01-22 DIAGNOSIS — G629 Polyneuropathy, unspecified: Secondary | ICD-10-CM | POA: Diagnosis not present

## 2023-01-22 DIAGNOSIS — R278 Other lack of coordination: Secondary | ICD-10-CM | POA: Diagnosis not present

## 2023-01-22 DIAGNOSIS — Z9181 History of falling: Secondary | ICD-10-CM | POA: Diagnosis not present

## 2023-01-22 DIAGNOSIS — M1990 Unspecified osteoarthritis, unspecified site: Secondary | ICD-10-CM | POA: Diagnosis not present

## 2023-01-27 DIAGNOSIS — M1990 Unspecified osteoarthritis, unspecified site: Secondary | ICD-10-CM | POA: Diagnosis not present

## 2023-01-27 DIAGNOSIS — G629 Polyneuropathy, unspecified: Secondary | ICD-10-CM | POA: Diagnosis not present

## 2023-01-27 DIAGNOSIS — R278 Other lack of coordination: Secondary | ICD-10-CM | POA: Diagnosis not present

## 2023-01-27 DIAGNOSIS — Z9181 History of falling: Secondary | ICD-10-CM | POA: Diagnosis not present

## 2023-02-09 ENCOUNTER — Telehealth: Payer: Self-pay

## 2023-02-09 NOTE — Telephone Encounter (Signed)
Pt ready for scheduling on or after 01/15/23.  Out-of-pocket cost due at time of visit: $0  Primary: Medicare [place] co-insurance: $240 Admin fee co-insurance: 0  Secondary:  [place] co-insurance: cover deductible Admin fee co-insurance: 0  Deductible: N/A  Prior Auth: N/A PA# Valid:   ** This summary of benefits is an estimation of the patient's out-of-pocket cost. Exact cost may vary based on individual plan coverage.   Patient will receive injection during her visit tomorrow, 02/10/23, with pcp.

## 2023-02-09 NOTE — Progress Notes (Signed)
Chief Complaint  Patient presents with   Medical Management of Chronic Issues    HPI: Amy Fischer 89 y.o. come in for Chronic disease management here with daughter  Due for prolia  today .  Doing ok today  Lives alone self care  ambulatory walker or cane   Was hospitalized  12 31 for 2 days  acut on chornic dchf presenting sob and elevated troponin  rx lasix   post covid po home test  Acute on chornic t12 compression fx also  previous falls  no fractures on  prolia  Takes  vit D  but no reg calcium .  No recnet cards fu but no new sx  Pt   every week  and some more.  Home pt for fall prevention ROS: See pertinent positives and negatives per HPI.  Past Medical History:  Diagnosis Date   Allergy    Anemia    Asymptomatic carotid artery stenosis    R ICA 40% stenosis on CTA 12/2011.   Bladder polyps    Cataract    BILATERAL-REMOVED   CHF (congestive heart failure) (HCC)    Diverticulosis    Elevated blood pressure 03/25/2011   Bp readings borderline today has hx of elvation  In office and ok at home   Not checke recently   She gfeels was elevated from anxiety     Episodic recurrent vertigo    MRI Head 2003   Esophageal spasm    GERD (gastroesophageal reflux disease)    Hepatic hemangioma    History of hepatitis    unknown type   Hyperlipidemia    Hyperplastic colon polyp    Hypothyroidism    Intestinal metaplasia of gastric mucosa    Osteoarthritis    Osteoporosis    Patellar fracture 07/13/2012   Scapular fracture 03/31/2012   small from direct blow with trip     Subclavian steal syndrome    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; CTA 2013 revealed open graft    Family History  Problem Relation Age of Onset   Hypertension Mother    Stroke Mother    Heart disease Father    Colon cancer Neg Hx    Neurofibromatosis Neg Hx    Allergic rhinitis Neg Hx    Asthma Neg Hx    Angioedema Neg Hx    Atopy Neg Hx    Eczema Neg Hx    Immunodeficiency Neg Hx     Urticaria Neg Hx     Social History   Socioeconomic History   Marital status: Widowed    Spouse name: Not on file   Number of children: 4   Years of education: Not on file   Highest education level: Bachelor's degree (e.g., BA, AB, BS)  Occupational History   Occupation: retired Pharmacist, hospital  Tobacco Use   Smoking status: Former    Packs/day: 0.75    Years: 20.00    Total pack years: 15.00    Types: Cigarettes    Quit date: 01/05/1991    Years since quitting: 32.1   Smokeless tobacco: Never  Vaping Use   Vaping Use: Never used  Substance and Sexual Activity   Alcohol use: Not Currently    Alcohol/week: 4.0 standard drinks of alcohol    Types: 4 Glasses of wine per week    Comment: socially   Drug use: No   Sexual activity: Not on file  Other Topics Concern   Not on file  Social  History Narrative   Widowed.  Lives alone in a one story home.  Has 4 children (3 living).  Retired first [employer].    HH of 1    No pets   Former smoker   Exercises regularly   Right handed   Social Determinants of Health   Financial Resource Strain: Low Risk  (09/16/2022)   Overall Financial Resource Strain (CARDIA)    Difficulty of Paying Living Expenses: Not hard at all  Food Insecurity: No Food Insecurity (12/29/2022)   Hunger Vital Sign    Worried About Running Out of Food in the Last Year: Never true    Ran Out of Food in the Last Year: Never true  Transportation Needs: No Transportation Needs (12/29/2022)   PRAPARE - Hydrologist (Medical): No    Lack of Transportation (Non-Medical): No  Physical Activity: Sufficiently Active (09/16/2022)   Exercise Vital Sign    Days of Exercise per Week: 7 days    Minutes of Exercise per Session: 30 min  Stress: No Stress Concern Present (09/16/2022)   [place]    Feeling of Stress : Not at all  Social Connections: Moderately Integrated (09/03/2021)    Social Connection and Isolation Panel [NHANES]    Frequency of Communication with Friends and Family: More than three times a week    Frequency of Social Gatherings with Friends and Family: Three times a week    Attends Religious Services: More than 4 times per year    Active Member of Clubs or Organizations: Yes    Attends Archivist Meetings: 1 to 4 times per year    Marital Status: Widowed    Outpatient Medications Prior to Visit  Medication Sig Dispense Refill   albuterol (VENTOLIN HFA) 108 (90 Base) MCG/ACT inhaler Inhale 2 puffs into the lungs every 6 (six) hours as needed for wheezing or shortness of breath. 18 g 2   cholecalciferol (VITAMIN D) 1000 units tablet Take 1,000 Units by mouth daily.     ELIQUIS 2.5 MG TABS tablet TAKE 1 TABLET TWICE A DAY 180 tablet 3   ezetimibe (ZETIA) 10 MG tablet TAKE 1 TABLET DAILY 90 tablet 3   folic acid (FOLVITE) 1 MG tablet TAKE 1 TABLET DAILY 90 tablet 3   furosemide (LASIX) 20 MG tablet Take 1 tablet (20 mg total) by mouth daily as needed for fluid. 20 tablet 0   ipratropium (ATROVENT) 0.06 % nasal spray USE 1 SPRAY IN EACH NOSTRIL TWICE A DAY AS NEEDED FOR RHINITIS 45 mL 3   ipratropium-albuterol (DUONEB) 0.5-2.5 (3) MG/3ML SOLN Take 3 mLs by nebulization every 6 (six) hours as needed. 150 mL 2   levothyroxine (SYNTHROID) 100 MCG tablet TAKE 1 TABLET EVERY MORNING ON AN EMPTY STOMACH 90 tablet 3   Melatonin 3 MG TABS Take 3 mg by mouth at bedtime.      Polyethyl Glycol-Propyl Glycol 0.4-0.3 % SOLN Place 1 drop into both eyes 2 (two) times daily.     rosuvastatin (CRESTOR) 5 MG tablet TAKE ONE-HALF (1/2) TABLET DAILY 90 tablet 3   SALINE NASAL SPRAY NA Place 1 spray into both nostrils in the morning and at bedtime.     vitamin B-12 (CYANOCOBALAMIN) 1000 MCG tablet Take 1,000 mcg by mouth daily.     loratadine (CLARITIN) 10 MG tablet Take 1 tablet (10 mg total) by mouth 2 (two) times daily as needed for allergies (Use  an extra dose  during flare ups.). (Patient not taking: Reported on 02/10/2023) 60 tablet 11   omeprazole (PRILOSEC) 40 MG capsule Take 1 capsule (40 mg total) by mouth daily. (Patient not taking: Reported on 09/27/2022) 90 capsule 4   potassium chloride SA (KLOR-CON M) 20 MEQ tablet Take 1 tablet (20 mEq total) by mouth daily as needed (when taking lasix). (Patient not taking: Reported on 02/10/2023) 20 tablet 0   No facility-administered medications prior to visit.     EXAM:  BP 132/78 (BP Location: Right Arm, Patient Position: Sitting, Cuff Size: Normal)   Pulse 87   Temp 97.8 F (36.6 C) (Oral)   Ht 5' 1"$  (1.549 m)   Wt 105 lb 9.6 oz (47.9 kg)   SpO2 96%   BMI 19.95 kg/m   Body mass index is 19.95 kg/m.  GENERAL: vitals reviewed and listed above, alert, oriented, appears well hydrated and in no acute distress HEENT: atraumatic, conjunctiva  clear, no obvious abnormalities on inspection of external nose and ears  NECK: no obvious masses on inspection palpation  LUNGS: clear to auscultation bilaterally, no wheezes, rales or rhonchi, good air movement CV: HRRR, no clubbing cyanosis or  peripheral edema nl cap refill  MS: moves all extremities without noticeable focal  abnormality PSYCH: pleasant and cooperative, no obvious depression or anxiety cognition intact  Lab Results  Component Value Date   WBC 7.1 12/30/2022   HGB 13.9 12/30/2022   HCT 41.6 12/30/2022   PLT 240 12/30/2022   GLUCOSE 97 12/30/2022   CHOL 143 11/04/2022   TRIG 83.0 11/04/2022   HDL 60.60 11/04/2022   LDLDIRECT 76.0 08/19/2019   LDLCALC 65 11/04/2022   ALT 14 12/28/2022   AST 16 12/28/2022   NA 141 12/30/2022   K 3.7 12/30/2022   CL 98 12/30/2022   CREATININE 1.10 (H) 12/30/2022   BUN 25 (H) 12/30/2022   CO2 29 12/30/2022   TSH 1.607 12/29/2022   INR 1.0 09/19/2022   HGBA1C 5.1 10/14/2016   BP Readings from Last 3 Encounters:  02/10/23 132/78  12/30/22 129/80  09/29/22 (!) 143/70    ASSESSMENT AND  PLAN:  Discussed the following assessment and plan:  Osteoporosis, unspecified osteoporosis type, unspecified pathological fracture presence - Plan: denosumab (PROLIA) injection 60 mg  Medication management - Plan: denosumab (PROLIA) injection 60 mg  Advanced age  History of vertebral compression fracture Cautioned that calcium levels can dec after prolia injections  so add  supplement  disc today esp around injections.  Also discussed safety contact at home Good to see her today Future orders placed for lab before next prolia injection -Patient advised to return or notify health care team  if  new concerns arise. In interim Counsel assess record revewi 32 minutes regarding meds fu needed calcium vit d   Patient Instructions  Good to see  you .   Please add  calcium supplement also to vit D   .  Equivalent of  calcium 600 mg twice a day  v it d  at least  1000 iu d3 per day.  Double  up what  you are taking .   Last vit d level was low normal  last check .  Plan lab  BMP and vit D level  before next prolia  injection  in 6 months  Get a routine  cardiology follow up.   Continue physical therapy fall prevention .      [name]. Denise Bramblett M.D.

## 2023-02-10 ENCOUNTER — Ambulatory Visit (INDEPENDENT_AMBULATORY_CARE_PROVIDER_SITE_OTHER): Payer: Medicare Other | Admitting: Internal Medicine

## 2023-02-10 ENCOUNTER — Other Ambulatory Visit: Payer: Self-pay | Admitting: Internal Medicine

## 2023-02-10 VITALS — BP 132/78 | HR 87 | Temp 97.8°F | Ht 61.0 in | Wt 105.6 lb

## 2023-02-10 DIAGNOSIS — R54 Age-related physical debility: Secondary | ICD-10-CM

## 2023-02-10 DIAGNOSIS — M810 Age-related osteoporosis without current pathological fracture: Secondary | ICD-10-CM

## 2023-02-10 DIAGNOSIS — Z8781 Personal history of (healed) traumatic fracture: Secondary | ICD-10-CM

## 2023-02-10 DIAGNOSIS — Z79899 Other long term (current) drug therapy: Secondary | ICD-10-CM | POA: Diagnosis not present

## 2023-02-10 DIAGNOSIS — M1990 Unspecified osteoarthritis, unspecified site: Secondary | ICD-10-CM | POA: Diagnosis not present

## 2023-02-10 DIAGNOSIS — R278 Other lack of coordination: Secondary | ICD-10-CM | POA: Diagnosis not present

## 2023-02-10 DIAGNOSIS — E039 Hypothyroidism, unspecified: Secondary | ICD-10-CM

## 2023-02-10 DIAGNOSIS — G629 Polyneuropathy, unspecified: Secondary | ICD-10-CM | POA: Diagnosis not present

## 2023-02-10 DIAGNOSIS — Z7901 Long term (current) use of anticoagulants: Secondary | ICD-10-CM

## 2023-02-10 DIAGNOSIS — Z9181 History of falling: Secondary | ICD-10-CM | POA: Diagnosis not present

## 2023-02-10 MED ORDER — DENOSUMAB 60 MG/ML ~~LOC~~ SOSY
60.0000 mg | PREFILLED_SYRINGE | Freq: Once | SUBCUTANEOUS | Status: AC
  Administered 2023-02-10: 60 mg via SUBCUTANEOUS

## 2023-02-10 NOTE — Patient Instructions (Addendum)
Good to see  you .   Please add  calcium supplement also to vit D   .  Equivalent of  calcium 600 mg twice a day  v it d  at least  1000 iu d3 per day.  Double  up what  you are taking .   Last vit d level was low normal  last check .  Plan lab  BMP and vit D level  before next prolia  injection  in 6 months  Get a routine  cardiology follow up.   Continue physical therapy fall prevention .

## 2023-02-13 DIAGNOSIS — M1990 Unspecified osteoarthritis, unspecified site: Secondary | ICD-10-CM | POA: Diagnosis not present

## 2023-02-13 DIAGNOSIS — Z9181 History of falling: Secondary | ICD-10-CM | POA: Diagnosis not present

## 2023-02-13 DIAGNOSIS — G629 Polyneuropathy, unspecified: Secondary | ICD-10-CM | POA: Diagnosis not present

## 2023-02-13 DIAGNOSIS — R278 Other lack of coordination: Secondary | ICD-10-CM | POA: Diagnosis not present

## 2023-02-16 ENCOUNTER — Encounter: Payer: Self-pay | Admitting: Internal Medicine

## 2023-02-17 DIAGNOSIS — Z9181 History of falling: Secondary | ICD-10-CM | POA: Diagnosis not present

## 2023-02-17 DIAGNOSIS — M1990 Unspecified osteoarthritis, unspecified site: Secondary | ICD-10-CM | POA: Diagnosis not present

## 2023-02-17 DIAGNOSIS — G629 Polyneuropathy, unspecified: Secondary | ICD-10-CM | POA: Diagnosis not present

## 2023-02-17 DIAGNOSIS — R278 Other lack of coordination: Secondary | ICD-10-CM | POA: Diagnosis not present

## 2023-02-19 DIAGNOSIS — Z9181 History of falling: Secondary | ICD-10-CM | POA: Diagnosis not present

## 2023-02-19 DIAGNOSIS — R278 Other lack of coordination: Secondary | ICD-10-CM | POA: Diagnosis not present

## 2023-02-19 DIAGNOSIS — M1990 Unspecified osteoarthritis, unspecified site: Secondary | ICD-10-CM | POA: Diagnosis not present

## 2023-02-19 DIAGNOSIS — G629 Polyneuropathy, unspecified: Secondary | ICD-10-CM | POA: Diagnosis not present

## 2023-02-23 DIAGNOSIS — L812 Freckles: Secondary | ICD-10-CM | POA: Diagnosis not present

## 2023-02-23 DIAGNOSIS — D225 Melanocytic nevi of trunk: Secondary | ICD-10-CM | POA: Diagnosis not present

## 2023-02-23 DIAGNOSIS — D1801 Hemangioma of skin and subcutaneous tissue: Secondary | ICD-10-CM | POA: Diagnosis not present

## 2023-02-23 DIAGNOSIS — L218 Other seborrheic dermatitis: Secondary | ICD-10-CM | POA: Diagnosis not present

## 2023-02-23 DIAGNOSIS — Z85828 Personal history of other malignant neoplasm of skin: Secondary | ICD-10-CM | POA: Diagnosis not present

## 2023-02-24 DIAGNOSIS — Z9181 History of falling: Secondary | ICD-10-CM | POA: Diagnosis not present

## 2023-02-24 DIAGNOSIS — M1990 Unspecified osteoarthritis, unspecified site: Secondary | ICD-10-CM | POA: Diagnosis not present

## 2023-02-24 DIAGNOSIS — G629 Polyneuropathy, unspecified: Secondary | ICD-10-CM | POA: Diagnosis not present

## 2023-02-24 DIAGNOSIS — R278 Other lack of coordination: Secondary | ICD-10-CM | POA: Diagnosis not present

## 2023-02-26 DIAGNOSIS — R278 Other lack of coordination: Secondary | ICD-10-CM | POA: Diagnosis not present

## 2023-02-26 DIAGNOSIS — Z9181 History of falling: Secondary | ICD-10-CM | POA: Diagnosis not present

## 2023-02-26 DIAGNOSIS — M1990 Unspecified osteoarthritis, unspecified site: Secondary | ICD-10-CM | POA: Diagnosis not present

## 2023-02-26 DIAGNOSIS — G629 Polyneuropathy, unspecified: Secondary | ICD-10-CM | POA: Diagnosis not present

## 2023-03-03 DIAGNOSIS — G629 Polyneuropathy, unspecified: Secondary | ICD-10-CM | POA: Diagnosis not present

## 2023-03-03 DIAGNOSIS — R278 Other lack of coordination: Secondary | ICD-10-CM | POA: Diagnosis not present

## 2023-03-03 DIAGNOSIS — M1990 Unspecified osteoarthritis, unspecified site: Secondary | ICD-10-CM | POA: Diagnosis not present

## 2023-03-03 DIAGNOSIS — Z9181 History of falling: Secondary | ICD-10-CM | POA: Diagnosis not present

## 2023-03-06 ENCOUNTER — Other Ambulatory Visit: Payer: Self-pay

## 2023-03-06 DIAGNOSIS — Z79899 Other long term (current) drug therapy: Secondary | ICD-10-CM

## 2023-03-06 DIAGNOSIS — I480 Paroxysmal atrial fibrillation: Secondary | ICD-10-CM

## 2023-03-10 DIAGNOSIS — Z9181 History of falling: Secondary | ICD-10-CM | POA: Diagnosis not present

## 2023-03-10 DIAGNOSIS — G629 Polyneuropathy, unspecified: Secondary | ICD-10-CM | POA: Diagnosis not present

## 2023-03-10 DIAGNOSIS — M1990 Unspecified osteoarthritis, unspecified site: Secondary | ICD-10-CM | POA: Diagnosis not present

## 2023-03-10 DIAGNOSIS — R278 Other lack of coordination: Secondary | ICD-10-CM | POA: Diagnosis not present

## 2023-03-12 DIAGNOSIS — G629 Polyneuropathy, unspecified: Secondary | ICD-10-CM | POA: Diagnosis not present

## 2023-03-12 DIAGNOSIS — M1990 Unspecified osteoarthritis, unspecified site: Secondary | ICD-10-CM | POA: Diagnosis not present

## 2023-03-12 DIAGNOSIS — Z9181 History of falling: Secondary | ICD-10-CM | POA: Diagnosis not present

## 2023-03-12 DIAGNOSIS — R278 Other lack of coordination: Secondary | ICD-10-CM | POA: Diagnosis not present

## 2023-03-18 ENCOUNTER — Encounter: Payer: Self-pay | Admitting: Family Medicine

## 2023-03-18 ENCOUNTER — Ambulatory Visit (INDEPENDENT_AMBULATORY_CARE_PROVIDER_SITE_OTHER): Payer: Medicare Other | Admitting: Family Medicine

## 2023-03-18 VITALS — BP 102/62 | HR 89 | Temp 97.7°F | Wt 101.0 lb

## 2023-03-18 DIAGNOSIS — J40 Bronchitis, not specified as acute or chronic: Secondary | ICD-10-CM | POA: Diagnosis not present

## 2023-03-18 DIAGNOSIS — R059 Cough, unspecified: Secondary | ICD-10-CM

## 2023-03-18 LAB — POCT INFLUENZA A/B
Influenza A, POC: NEGATIVE
Influenza B, POC: NEGATIVE

## 2023-03-18 MED ORDER — AZITHROMYCIN 250 MG PO TABS: ORAL_TABLET | ORAL | 0 refills | Status: DC

## 2023-03-18 MED ORDER — BENZONATATE 200 MG PO CAPS: 200.0000 mg | ORAL_CAPSULE | Freq: Four times a day (QID) | ORAL | 0 refills | Status: DC | PRN

## 2023-03-18 NOTE — Progress Notes (Signed)
   Subjective:    Patient ID: Amy Fischer, female    DOB: 03-Aug-1929, 89 y.o.   MRN: [id number]  HPI Here for one week of a dry cough, mild SOB, slight fever, and headache. No ST. Drinking fluids. She has tested negative for Covid and flu.    Review of Systems  Constitutional:  Positive for fever.  HENT:  Negative for congestion, ear pain, postnasal drip, sinus pressure and sore throat.   Eyes: Negative.   Respiratory:  Positive for cough and shortness of breath. Negative for wheezing.   Gastrointestinal: Negative.        Objective:   Physical Exam Constitutional:      Appearance: She is not ill-appearing.     Comments: She cough occasionally   HENT:     Right Ear: Tympanic membrane, ear canal and external ear normal.     Left Ear: Tympanic membrane, ear canal and external ear normal.     Nose: Nose normal.     Mouth/Throat:     Pharynx: Oropharynx is clear.  Eyes:     Conjunctiva/sclera: Conjunctivae normal.  Pulmonary:     Effort: Pulmonary effort is normal.     Breath sounds: Normal breath sounds.  Lymphadenopathy:     Cervical: No cervical adenopathy.  Neurological:     Mental Status: She is alert.           Assessment & Plan:  Bronchitis, treat with a Zpack. Add Benzonatate as needed.  Alysia Penna, MD

## 2023-03-24 DIAGNOSIS — G629 Polyneuropathy, unspecified: Secondary | ICD-10-CM | POA: Diagnosis not present

## 2023-03-24 DIAGNOSIS — R278 Other lack of coordination: Secondary | ICD-10-CM | POA: Diagnosis not present

## 2023-03-24 DIAGNOSIS — M1990 Unspecified osteoarthritis, unspecified site: Secondary | ICD-10-CM | POA: Diagnosis not present

## 2023-03-24 DIAGNOSIS — Z9181 History of falling: Secondary | ICD-10-CM | POA: Diagnosis not present

## 2023-03-26 DIAGNOSIS — G629 Polyneuropathy, unspecified: Secondary | ICD-10-CM | POA: Diagnosis not present

## 2023-03-26 DIAGNOSIS — R278 Other lack of coordination: Secondary | ICD-10-CM | POA: Diagnosis not present

## 2023-03-26 DIAGNOSIS — M1990 Unspecified osteoarthritis, unspecified site: Secondary | ICD-10-CM | POA: Diagnosis not present

## 2023-03-26 DIAGNOSIS — Z9181 History of falling: Secondary | ICD-10-CM | POA: Diagnosis not present

## 2023-03-30 NOTE — Progress Notes (Signed)
HPI: Follow-up mitral valve repair. Patient hospitalized October 2016 with congestive heart failure. Transesophageal echocardiogram showed normal LV function. There was a flail posterior leaflet with severe mitral regurgitation. There was mild to moderate tricuspid regurgitation. Cardiac catheterization October 2016 showed a 30% LAD, 60% first diagonal and 30% right coronary artery. Ejection fraction 60%. Patient subsequent had mitral valve repair. Note preoperative carotid Dopplers showed 1-39% stenosis. Note the patient had atrial fibrillation after surgery.  Follow-up monitor March 2019 showed sinus rhythm with PVCs and paroxysmal atrial flutter/fibrillation. Patient placed on amiodarone and apixaban.  Follow-up monitor February 2021 due to presyncope showed sinus with occasional PAC, PVC and brief PAT. Echocardiogram January 2024 showed normal LV function, mild left ventricular hypertrophy, mild RV dysfunction, mild right atrial enlargement, status post mitral valve repair with mild mitral regurgitation and mean gradient 5 mmHg, mild to moderate tricuspid regurgitation.  Since last seen, she denies dyspnea, chest pain, palpitations or syncope.  Occasional minimal pedal edema.  Current Outpatient Medications  Medication Sig Dispense Refill   albuterol (VENTOLIN HFA) 108 (90 Base) MCG/ACT inhaler Inhale 2 puffs into the lungs every 6 (six) hours as needed for wheezing or shortness of breath. 18 g 2   benzonatate (TESSALON) 200 MG capsule Take 1 capsule (200 mg total) by mouth every 6 (six) hours as needed for cough. 30 capsule 0   cholecalciferol (VITAMIN D) 1000 units tablet Take 1,000 Units by mouth daily.     ELIQUIS 2.5 MG TABS tablet TAKE 1 TABLET TWICE A DAY 180 tablet 3   ezetimibe (ZETIA) 10 MG tablet TAKE 1 TABLET DAILY 90 tablet 3   folic acid (FOLVITE) 1 MG tablet TAKE 1 TABLET DAILY 90 tablet 3   furosemide (LASIX) 20 MG tablet Take 1 tablet (20 mg total) by mouth daily as needed for  fluid. 20 tablet 0   ipratropium (ATROVENT) 0.06 % nasal spray USE 1 SPRAY IN EACH NOSTRIL TWICE A DAY AS NEEDED FOR RHINITIS 45 mL 3   levothyroxine (SYNTHROID) 100 MCG tablet TAKE 1 TABLET EVERY MORNING ON AN EMPTY STOMACH 90 tablet 3   loratadine (CLARITIN) 10 MG tablet Take 1 tablet (10 mg total) by mouth 2 (two) times daily as needed for allergies (Use an extra dose during flare ups.). 60 tablet 11   Melatonin 3 MG TABS Take 3 mg by mouth at bedtime.      Polyethyl Glycol-Propyl Glycol 0.4-0.3 % SOLN Place 1 drop into both eyes 2 (two) times daily.     rosuvastatin (CRESTOR) 5 MG tablet TAKE ONE-HALF (1/2) TABLET DAILY 90 tablet 3   SALINE NASAL SPRAY NA Place 1 spray into both nostrils in the morning and at bedtime.     vitamin B-12 (CYANOCOBALAMIN) 1000 MCG tablet Take 1,000 mcg by mouth daily.     azithromycin (ZITHROMAX Z-PAK) 250 MG tablet As directed (Patient not taking: Reported on 04/07/2023) 6 each 0   ipratropium-albuterol (DUONEB) 0.5-2.5 (3) MG/3ML SOLN Take 3 mLs by nebulization every 6 (six) hours as needed. (Patient not taking: Reported on 04/07/2023) 150 mL 2   omeprazole (PRILOSEC) 40 MG capsule Take 1 capsule (40 mg total) by mouth daily. 90 capsule 4   potassium chloride SA (KLOR-CON M) 20 MEQ tablet Take 1 tablet (20 mEq total) by mouth daily as needed (when taking lasix). 20 tablet 0   No current facility-administered medications for this visit.     Past Medical History:  Diagnosis Date   Allergy  Anemia    Asymptomatic carotid artery stenosis    R ICA 40% stenosis on CTA 12/2011.   Bladder polyps    Cataract    BILATERAL-REMOVED   CHF (congestive heart failure)    Diverticulosis    Elevated blood pressure 03/25/2011   Bp readings borderline today has hx of elvation  In office and ok at home   Not checke recently   She gfeels was elevated from anxiety     Episodic recurrent vertigo    MRI Head 2003   Esophageal spasm    GERD (gastroesophageal reflux disease)     Hepatic hemangioma    History of hepatitis    unknown type   Hyperlipidemia    Hyperplastic colon polyp    Hypothyroidism    Intestinal metaplasia of gastric mucosa    Osteoarthritis    Osteoporosis    Patellar fracture 07/13/2012   Scapular fracture 03/31/2012   small from direct blow with trip     Subclavian steal syndrome    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; CTA 2013 revealed open graft    Past Surgical History:  Procedure Laterality Date   APPENDECTOMY     CARDIAC CATHETERIZATION N/A 10/04/2015   Procedure: Right/Left Heart Cath and Coronary Angiography;  Surgeon: Lyn RecordsHenry W Smith, MD;  [facility];  Service: Cardiovascular;  Laterality: N/A;   CAROTID-SUBCLAVIAN BYPASS GRAFT Left 1999   for Stevens steal syndrome   CHOLECYSTECTOMY     COLONOSCOPY     CYSTECTOMY Left    hand   ELBOW SURGERY Left    KNEE SURGERY Left 2014   MITRAL VALVE REPAIR N/A 10/10/2015   Procedure: MITRAL VALVE REPAIR (MVR) WITH SIZE 28 CARPENTIER-EDWARDS PHYSIO II ANNULOPLASTY RING;  Surgeon: Loreli SlotSteven C Hendrickson, MD;  Location: [facility] OR;  Service: Open Heart Surgery;  Laterality: N/A;   ROTATOR CUFF REPAIR Left    TEAR DUCT PROBING  07/2014   TEE WITHOUT CARDIOVERSION N/A 10/03/2015   Procedure: TRANSESOPHAGEAL ECHOCARDIOGRAM (TEE);  Surgeon: Laurey Moralealton S McLean, MD;  [facility];  Service: Cardiovascular;  Laterality: N/A;   TEE WITHOUT CARDIOVERSION N/A 10/10/2015   Procedure: TRANSESOPHAGEAL ECHOCARDIOGRAM (TEE);  Surgeon: Loreli SlotSteven C Hendrickson, MD;  [facility];  Service: Open Heart Surgery;  Laterality: N/A;   TONSILLECTOMY     TUBAL LIGATION      Social History   Socioeconomic History   Marital status: Widowed    Spouse name: Not on file   Number of children: 4   Years of education: Not on file   Highest education level: Bachelor's degree (e.g., BA, AB, BS)  Occupational History   Occupation: retired Runner, broadcasting/film/videoteacher  Tobacco Use   Smoking status: Former     Packs/day: 0.75    Years: 20.00    Additional pack years: 0.00    Total pack years: 15.00    Types: Cigarettes    Quit date: 01/05/1991    Years since quitting: 32.2   Smokeless tobacco: Never  Vaping Use   Vaping Use: Never used  Substance and Sexual Activity   Alcohol use: Not Currently    Alcohol/week: 4.0 standard drinks of alcohol    Types: 4 Glasses of wine per week    Comment: socially   Drug use: No   Sexual activity: Not on file  Other Topics Concern   Not on file  Social History Narrative   Widowed.  Lives alone in a one story home.  Has 4 children (  3 living).  Retired first Merchant navy officergrade teacher.    HH of 1    No pets   Former smoker   Exercises regularly   Right handed   Social Determinants of Health   Financial Resource Strain: Low Risk  (09/16/2022)   Overall Financial Resource Strain (CARDIA)    Difficulty of Paying Living Expenses: Not hard at all  Food Insecurity: No Food Insecurity (12/29/2022)   Hunger Vital Sign    Worried About Running Out of Food in the Last Year: Never true    Ran Out of Food in the Last Year: Never true  Transportation Needs: No Transportation Needs (12/29/2022)   PRAPARE - Administrator, Civil ServiceTransportation    Lack of Transportation (Medical): No    Lack of Transportation (Non-Medical): No  Physical Activity: Sufficiently Active (09/16/2022)   Exercise Vital Sign    Days of Exercise per Week: 7 days    Minutes of Exercise per Session: 30 min  Stress: No Stress Concern Present (09/16/2022)   [employer] - Occupational Stress Questionnaire    Feeling of Stress : Not at all  Social Connections: Moderately Integrated (09/03/2021)   Social Connection and Isolation Panel [NHANES]    Frequency of Communication with Friends and Family: More than three times a week    Frequency of Social Gatherings with Friends and Family: Three times a week    Attends Religious Services: More than 4 times per year    Active Member of Clubs or Organizations:  Yes    Attends BankerClub or Organization Meetings: 1 to 4 times per year    Marital Status: Widowed  Intimate Partner Violence: Not At Risk (12/29/2022)   Humiliation, Afraid, Rape, and Kick questionnaire    Fear of Current or Ex-Partner: No    Emotionally Abused: No    Physically Abused: No    Sexually Abused: No    Family History  Problem Relation Age of Onset   Hypertension Mother    Stroke Mother    Heart disease Father    Colon cancer Neg Hx    Neurofibromatosis Neg Hx    Allergic rhinitis Neg Hx    Asthma Neg Hx    Angioedema Neg Hx    Atopy Neg Hx    Eczema Neg Hx    Immunodeficiency Neg Hx    Urticaria Neg Hx     ROS: no fevers or chills, productive cough, hemoptysis, dysphasia, odynophagia, melena, hematochezia, dysuria, hematuria, rash, seizure activity, orthopnea, PND, claudication. Remaining systems are negative.  Physical Exam: Well-developed well-nourished in no acute distress.  Skin is warm and dry.  HEENT is normal.  Neck is supple.  Chest is clear to auscultation with normal expansion.  Cardiovascular exam is regular rate and rhythm.  Abdominal exam nontender or distended. No masses palpated. Extremities show no edema. neuro grossly intact  ECG-normal sinus rhythm with PACs, no ST changes.  Personally reviewed  A/P  1 paroxysmal atrial fibrillation-patient is in sinus rhythm.  Continue apixaban.  She was previously on amiodarone but is not sure she is taking this.  We will review her medications by phone when she returns home.  2 hypertension-patient's blood pressure is controlled.  Continue present medical regimen.  3 history of mitral valve repair-continue SBE prophylaxis.  Recent echocardiogram showed stable repair.  4 history of nonobstructive coronary disease-patient denies chest pain.  Continue statin.  She is not on aspirin given need for apixaban.  5 hyperlipidemia-continue Zetia and statin.  Olga MillersBrian Waneda Klammer, MD

## 2023-03-31 DIAGNOSIS — M1990 Unspecified osteoarthritis, unspecified site: Secondary | ICD-10-CM | POA: Diagnosis not present

## 2023-03-31 DIAGNOSIS — R278 Other lack of coordination: Secondary | ICD-10-CM | POA: Diagnosis not present

## 2023-03-31 DIAGNOSIS — Z9181 History of falling: Secondary | ICD-10-CM | POA: Diagnosis not present

## 2023-03-31 DIAGNOSIS — G629 Polyneuropathy, unspecified: Secondary | ICD-10-CM | POA: Diagnosis not present

## 2023-04-03 DIAGNOSIS — Z9181 History of falling: Secondary | ICD-10-CM | POA: Diagnosis not present

## 2023-04-03 DIAGNOSIS — M1990 Unspecified osteoarthritis, unspecified site: Secondary | ICD-10-CM | POA: Diagnosis not present

## 2023-04-03 DIAGNOSIS — R278 Other lack of coordination: Secondary | ICD-10-CM | POA: Diagnosis not present

## 2023-04-03 DIAGNOSIS — G629 Polyneuropathy, unspecified: Secondary | ICD-10-CM | POA: Diagnosis not present

## 2023-04-07 ENCOUNTER — Ambulatory Visit: Payer: Medicare Other | Attending: Cardiology | Admitting: Cardiology

## 2023-04-07 ENCOUNTER — Encounter: Payer: Self-pay | Admitting: Cardiology

## 2023-04-07 VITALS — BP 118/70 | HR 92 | Wt 105.6 lb

## 2023-04-07 DIAGNOSIS — M1990 Unspecified osteoarthritis, unspecified site: Secondary | ICD-10-CM | POA: Diagnosis not present

## 2023-04-07 DIAGNOSIS — Z9889 Other specified postprocedural states: Secondary | ICD-10-CM | POA: Diagnosis not present

## 2023-04-07 DIAGNOSIS — Z9181 History of falling: Secondary | ICD-10-CM | POA: Diagnosis not present

## 2023-04-07 DIAGNOSIS — I480 Paroxysmal atrial fibrillation: Secondary | ICD-10-CM | POA: Insufficient documentation

## 2023-04-07 DIAGNOSIS — I10 Essential (primary) hypertension: Secondary | ICD-10-CM | POA: Diagnosis not present

## 2023-04-07 DIAGNOSIS — I2510 Atherosclerotic heart disease of native coronary artery without angina pectoris: Secondary | ICD-10-CM | POA: Insufficient documentation

## 2023-04-07 DIAGNOSIS — G629 Polyneuropathy, unspecified: Secondary | ICD-10-CM | POA: Diagnosis not present

## 2023-04-07 DIAGNOSIS — R278 Other lack of coordination: Secondary | ICD-10-CM | POA: Diagnosis not present

## 2023-04-07 DIAGNOSIS — E7800 Pure hypercholesterolemia, unspecified: Secondary | ICD-10-CM | POA: Diagnosis not present

## 2023-04-07 NOTE — Patient Instructions (Signed)
    Follow-Up: At [facility], you and your health needs are our priority.  As part of our continuing mission to provide you with exceptional heart care, we have created designated Provider Care Teams.  These Care Teams include your primary Cardiologist (physician) and Advanced Practice Providers (APPs -  Physician Assistants and Nurse Practitioners) who all work together to provide you with the care you need, when you need it.  We recommend signing up for the patient portal called "MyChart".  Sign up information is provided on this After Visit Summary.  MyChart is used to connect with patients for Virtual Visits (Telemedicine).  Patients are able to view lab/test results, encounter notes, upcoming appointments, etc.  Non-urgent messages can be sent to your provider as well.   To learn more about what you can do with MyChart, go to [email or web address].    Your next appointment:   12 month(s)  Provider:   Brian Crenshaw, MD     

## 2023-04-08 ENCOUNTER — Telehealth: Payer: Self-pay | Admitting: *Deleted

## 2023-04-08 NOTE — Telephone Encounter (Signed)
Left message for patient with Dr Creshaw's recommendations.   

## 2023-04-08 NOTE — Telephone Encounter (Signed)
Spoke with pt regarding medications. She is not taking amiodarone. She thinks she was having dizziness and because of all the side effects of the amiodarone she stopped taking it and the dizziness resolved. Will make dr Jens Som aware.

## 2023-04-09 DIAGNOSIS — G629 Polyneuropathy, unspecified: Secondary | ICD-10-CM | POA: Diagnosis not present

## 2023-04-09 DIAGNOSIS — R278 Other lack of coordination: Secondary | ICD-10-CM | POA: Diagnosis not present

## 2023-04-09 DIAGNOSIS — Z9181 History of falling: Secondary | ICD-10-CM | POA: Diagnosis not present

## 2023-04-09 DIAGNOSIS — M1990 Unspecified osteoarthritis, unspecified site: Secondary | ICD-10-CM | POA: Diagnosis not present

## 2023-04-14 DIAGNOSIS — G629 Polyneuropathy, unspecified: Secondary | ICD-10-CM | POA: Diagnosis not present

## 2023-04-14 DIAGNOSIS — M1990 Unspecified osteoarthritis, unspecified site: Secondary | ICD-10-CM | POA: Diagnosis not present

## 2023-04-14 DIAGNOSIS — Z9181 History of falling: Secondary | ICD-10-CM | POA: Diagnosis not present

## 2023-04-14 DIAGNOSIS — R278 Other lack of coordination: Secondary | ICD-10-CM | POA: Diagnosis not present

## 2023-04-21 DIAGNOSIS — G629 Polyneuropathy, unspecified: Secondary | ICD-10-CM | POA: Diagnosis not present

## 2023-04-21 DIAGNOSIS — M1990 Unspecified osteoarthritis, unspecified site: Secondary | ICD-10-CM | POA: Diagnosis not present

## 2023-04-21 DIAGNOSIS — R278 Other lack of coordination: Secondary | ICD-10-CM | POA: Diagnosis not present

## 2023-04-21 DIAGNOSIS — Z9181 History of falling: Secondary | ICD-10-CM | POA: Diagnosis not present

## 2023-04-23 DIAGNOSIS — Z9181 History of falling: Secondary | ICD-10-CM | POA: Diagnosis not present

## 2023-04-23 DIAGNOSIS — G629 Polyneuropathy, unspecified: Secondary | ICD-10-CM | POA: Diagnosis not present

## 2023-04-23 DIAGNOSIS — R278 Other lack of coordination: Secondary | ICD-10-CM | POA: Diagnosis not present

## 2023-04-23 DIAGNOSIS — M1990 Unspecified osteoarthritis, unspecified site: Secondary | ICD-10-CM | POA: Diagnosis not present

## 2023-04-24 ENCOUNTER — Telehealth: Payer: Self-pay

## 2023-04-24 NOTE — Progress Notes (Signed)
Care Management & Coordination Services Pharmacy Team  Reason for Encounter: Appointment Reminder  Contacted patient to confirm in office appointment with Delano Metz, PharmD on 04/29/2023 at 9:30. Spoke with patient on 04/24/2023   Have you seen any other providers since your last visit? **Patient denies  Any changes in your medications or health? Patient denies  Any side effects from any medications? Patient denies  Do you have an symptoms or problems not managed by your medications? Patient denies  Any concerns about your health right now? Patient denies  Has your provider asked that you check blood pressure, blood sugar, or follow special diet at home? Patient denies  Do you get any type of exercise on a regular basis? Patient is doing physical therapy.  Can you think of a goal you would like to reach for your health? Patient is now having to use a walker, she doesn't like that she has to use it and she would like to live another year.   Do you have any problems getting your medications? Patient denies  Is there anything that you would like to discuss during the appointment? Patient denies  Please bring medications and supplements to appointment   Chart review:  Recent office visits:  03/18/2023 Gershon Crane MD - Patient was seen for bronchitis and an additional concern. Started Zpac and Benzonatate.   02/10/2023 Berniece Andreas MD - Patient was seen for Osteoporosis, unspecified osteoporosis type, unspecified pathological fracture presence and an additional concern. No medication changes.  Recent consult visits:  04/07/2023 Olga Millers MD (cardiology) - Patient was seen for PAF and additional concerns. No medication changes.   Hospital visits:  Admitted to the hospital on 12/28/2022 due to acute on chronic diastolic congestive heart failure NYHA class 3. Discharge date was 12/30/2022.    New?Medications Started at [facility] Discharge:?? furosemide (LASIX) potassium  chloride SA (KLOR-CON M) Medication Changes at Hospital Discharge: None Medications Discontinued at Hospital Discharge: None Medications that remain the same after Hospital Discharge:??  -All other medications will remain the same.    Care Gaps: AWV - completed 09/16/2022 Last BP - 118/70 on 04/07/2023 Tdap - overdue Covid - overdue  Star Rating Drugs:  Rosuvastatin 5 mg  - last filled 03/01/2023 90 DS at [facility]  Inetta Fermo [facility]  Clinical Pharmacist Assistant [phone]

## 2023-04-27 ENCOUNTER — Other Ambulatory Visit: Payer: Self-pay | Admitting: Internal Medicine

## 2023-04-28 ENCOUNTER — Telehealth: Payer: Self-pay

## 2023-04-28 DIAGNOSIS — G629 Polyneuropathy, unspecified: Secondary | ICD-10-CM | POA: Diagnosis not present

## 2023-04-28 DIAGNOSIS — R278 Other lack of coordination: Secondary | ICD-10-CM | POA: Diagnosis not present

## 2023-04-28 DIAGNOSIS — Z9181 History of falling: Secondary | ICD-10-CM | POA: Diagnosis not present

## 2023-04-28 DIAGNOSIS — M1990 Unspecified osteoarthritis, unspecified site: Secondary | ICD-10-CM | POA: Diagnosis not present

## 2023-04-28 NOTE — Progress Notes (Signed)
Care Management & Coordination Services Pharmacy Note  04/28/2023 Name:  Amy Fischer MRN:  [id number] DOB:  14-Oct-1929  Summary: Denies any signs of bleed with Eliquis Denies any falls since September Concerned about her Vit D intake and wants to know if she can stop taking as much (5000 units daily)  Recommendations/Changes made from today's visit: -ORDER updated Vit D lab at next PCP appt (PCP has already ordered and sch for 08/11/23) -Continue prolia injections and counseled on strength training exercises and calcium/vit d intake -Counseled on major signs of bleed with Eliquis and to avoid NSAID use  Follow up plan: General review in July Pharmacist visit in November   Subjective: Amy Fischer is an 89 y.o. year old female who is a primary patient of Panosh, Neta Mends, MD.  The care coordination team was consulted for assistance with disease management and care coordination needs.    Engaged with patient face to face for Initial Visit. Patient walks with assistance of a walker, drives herself to appt with all of her medications. She lives alone, does go to church every Sunday and is a retired Community education officer. Handles her medications on her own but daughter-in-law is a Teacher, early years/pre if she has questions.  Recent office visits: 03/18/2023 Gershon Crane MD - Patient was seen for bronchitis and an additional concern. Started Zpac and Benzonatate.    02/10/2023 Berniece Andreas MD - Patient was seen for Osteoporosis, unspecified osteoporosis type, unspecified pathological fracture presence and an additional concern. No medication changes.  Recent consult visits: 04/07/2023 Olga Millers MD (cardiology) - Patient was seen for PAF and additional concerns. No medication changes.   Hospital visits: 12/28/22 - For CHF, LOS 2 days, START Lasix, [place]-CON M   Objective:  Lab Results  Component Value Date   CREATININE 1.10 (H) 12/30/2022   BUN 25 (H) 12/30/2022   GFR 70.64  11/04/2022   GFRNONAA 47 (L) 12/30/2022   GFRAA 78 10/22/2020   NA 141 12/30/2022   K 3.7 12/30/2022   CALCIUM 10.2 12/30/2022   CO2 29 12/30/2022   GLUCOSE 97 12/30/2022    Lab Results  Component Value Date/Time   HGBA1C 5.1 10/14/2016 11:50 AM   HGBA1C 5.6 10/09/2015 11:32 PM   HGBA1C 5.7 (H) 09/30/2015 10:10 AM   GFR 70.64 11/04/2022 02:20 PM   GFR 61.09 11/11/2021 12:06 PM    Last diabetic Eye exam: No results found for: "HMDIABEYEEXA"  Last diabetic Foot exam: No results found for: "HMDIABFOOTEX"   Lab Results  Component Value Date   CHOL 143 11/04/2022   HDL 60.60 11/04/2022   LDLCALC 65 11/04/2022   LDLDIRECT 76.0 08/19/2019   TRIG 83.0 11/04/2022   CHOLHDL 2 11/04/2022       Latest Ref Rng & Units 12/28/2022   10:26 AM 09/26/2022   10:56 PM 09/19/2022   12:43 AM  Hepatic Function  Total Protein 6.5 - 8.1 g/dL 6.3  6.6  7.0   Albumin 3.5 - 5.0 g/dL 3.6  3.6  3.8   AST 15 - 41 U/L 16  19  23    ALT 0 - 44 U/L 14  15  21    Alk Phosphatase 38 - 126 U/L 53  58  64   Total Bilirubin 0.3 - 1.2 mg/dL 0.7  0.5  0.6     Lab Results  Component Value Date/Time   TSH 1.607 12/29/2022 10:34 AM   TSH 1.09 11/04/2022 02:20 PM   TSH 4.10 11/11/2021  12:06 PM   FREET4 1.15 08/26/2021 10:26 AM   FREET4 0.98 07/20/2012 10:12 AM       Latest Ref Rng & Units 12/30/2022    4:21 AM 12/28/2022   10:26 AM 09/26/2022   10:56 PM  CBC  WBC 4.0 - 10.5 K/uL 7.1  8.6  6.1   Hemoglobin 12.0 - 15.0 g/dL 16.1  09.6  04.5   Hematocrit 36.0 - 46.0 % 41.6  38.6  41.1   Platelets 150 - 400 K/uL 240  186  237     Lab Results  Component Value Date/Time   VD25OH 35.58 11/11/2021 12:06 PM   VD25OH 40.32 08/19/2019 11:52 AM   VITAMINB12 981 (H) 11/11/2021 12:06 PM   VITAMINB12 596 01/28/2021 09:09 AM    Clinical ASCVD: No  The ASCVD Risk score (Arnett DK, et al., 2019) failed to calculate for the following reasons:   The 2019 ASCVD risk score is only valid for ages 71 to 31        03/18/2023    3:35 PM 02/10/2023    2:49 PM 09/16/2022    3:29 PM  Depression screen PHQ 2/9  Decreased Interest 1 0 0  Down, Depressed, Hopeless 1 0 0  PHQ - 2 Score 2 0 0  Altered sleeping 1 0   Tired, decreased energy 0 0   Change in appetite 0 0   Feeling bad or failure about yourself  0 0   Trouble concentrating 0 0   Moving slowly or fidgety/restless 0 0   Suicidal thoughts 0 0   PHQ-9 Score 3 0   Difficult doing work/chores Not difficult at all Not difficult at all      Social History   Tobacco Use  Smoking Status Former   Packs/day: 0.75   Years: 20.00   Additional pack years: 0.00   Total pack years: 15.00   Types: Cigarettes   Quit date: 01/05/1991   Years since quitting: 32.3  Smokeless Tobacco Never   BP Readings from Last 3 Encounters:  04/07/23 118/70  03/18/23 102/62  02/10/23 132/78   Pulse Readings from Last 3 Encounters:  04/07/23 92  03/18/23 89  02/10/23 87   Wt Readings from Last 3 Encounters:  04/07/23 105 lb 9.6 oz (47.9 kg)  03/18/23 101 lb (45.8 kg)  02/10/23 105 lb 9.6 oz (47.9 kg)   BMI Readings from Last 3 Encounters:  04/07/23 19.95 kg/m  03/18/23 19.08 kg/m  02/10/23 19.95 kg/m    Allergies  Allergen Reactions   Codeine Nausea And Vomiting   Risedronate Sodium     Upset stomach. Could take Fosamax.   Statins     Muscles hurt, can take low dose    Tape Other (See Comments)    Blisters, Please use "paper" tape   Amoxicillin-Pot Clavulanate Diarrhea    Not allergic  2014, Pt can take z pack only     Medications Reviewed Today     Reviewed by Lewayne Bunting, MD (Physician) on 04/07/23 at 1048  Med List Status: <None>   Medication Order Taking? Sig Documenting Provider Last Dose Status Informant  albuterol (VENTOLIN HFA) 108 (90 Base) MCG/ACT inhaler [id number] Yes Inhale 2 puffs into the lungs every 6 (six) hours as needed for wheezing or shortness of breath. Jessica Priest, MD Taking Active Self  azithromycin  (ZITHROMAX Z-PAK) 250 MG tablet [id number] No As directed  Patient not taking: Reported on 04/07/2023   Nelwyn Salisbury, MD Not  Taking Active   benzonatate (TESSALON) 200 MG capsule [id number] Yes Take 1 capsule (200 mg total) by mouth every 6 (six) hours as needed for cough. Nelwyn Salisbury, MD Taking Active   cholecalciferol (VITAMIN D) 1000 units tablet [id number] Yes Take 1,000 Units by mouth daily. [provider] Taking Active Self           Med Note Cyndie Chime, [facility]   Sat Sep 27, 2022 12:25 AM)    ELIQUIS 2.5 MG TABS tablet [id number] Yes TAKE 1 TABLET TWICE A DAY Crenshaw, Madolyn Frieze, MD Taking Active Self  ezetimibe (ZETIA) 10 MG tablet [id number] Yes TAKE 1 TABLET DAILY Panosh, Neta Mends, MD Taking Active Self  folic acid (FOLVITE) 1 MG tablet [id number] Yes TAKE 1 TABLET DAILY Panosh, Neta Mends, MD Taking Active Self  furosemide (LASIX) 20 MG tablet [id number] Yes Take 1 tablet (20 mg total) by mouth daily as needed for fluid. Joseph Art, DO Taking Active   ipratropium (ATROVENT) 0.06 % nasal spray [id number] Yes USE 1 SPRAY IN EACH NOSTRIL TWICE A DAY AS NEEDED FOR RHINITIS Panosh, Neta Mends, MD Taking Active Self  ipratropium-albuterol (DUONEB) 0.5-2.5 (3) MG/3ML SOLN [id number] No Take 3 mLs by nebulization every 6 (six) hours as needed.  Patient not taking: Reported on 04/07/2023   Jessica Priest, MD Not Taking Active Self  levothyroxine (SYNTHROID) 100 MCG tablet [id number] Yes TAKE 1 TABLET EVERY MORNING ON AN EMPTY STOMACH Panosh, Neta Mends, MD Taking Active Self  loratadine (CLARITIN) 10 MG tablet [id number] Yes Take 1 tablet (10 mg total) by mouth 2 (two) times daily as needed for allergies (Use an extra dose during flare ups.). Jessica Priest, MD Taking Active Self  Melatonin 3 MG TABS [id number] Yes Take 3 mg by mouth at bedtime.  [provider] Taking Active Self  omeprazole (PRILOSEC) 40 MG capsule [id number] No Take 1 capsule (40 mg total) by mouth daily. Kozlow, Alvira Philips,  MD Unknown Active Self  Polyethyl Glycol-Propyl Glycol 0.4-0.3 % SOLN [id number] Yes Place 1 drop into both eyes 2 (two) times daily. [provider] Taking Active Self           Med Note Ashley Jacobs, CRISTINA E   Thu Dec 27, 2015  8:37 AM)    potassium chloride SA (KLOR-CON M) 20 MEQ tablet [id number] No Take 1 tablet (20 mEq total) by mouth daily as needed (when taking lasix). Joseph Art, DO Unknown Active   rosuvastatin (CRESTOR) 5 MG tablet [id number] Yes TAKE ONE-HALF (1/2) TABLET DAILY Panosh, Neta Mends, MD Taking Active Self  SALINE NASAL SPRAY NA [id number] Yes Place 1 spray into both nostrils in the morning and at bedtime. [provider] Taking Active Self           Med Note Mardene Sayer   Sat Jan 12, 2016 10:04 PM)    vitamin B-12 (CYANOCOBALAMIN) 1000 MCG tablet [id number] Yes Take 1,000 mcg by mouth daily. [provider] Taking Active Self  Med List Note Erenest Rasher, CPhT 05/14/16 1544): [phone] If you have any questions for [facility] scripts please call this line for Mrs. Troxlers medication information            SDOH:  (Social Determinants of Health) assessments and interventions performed: Yes SDOH Interventions    Flowsheet Row Clinical Support from 09/16/2022 in [facility] [place] Office Visit from 08/26/2021 in [facility] [place]  SDOH Interventions  Food Insecurity Interventions Intervention Not Indicated --  Transportation Interventions Intervention Not Indicated --  Depression Interventions/Treatment  -- ZOX0-9 Score <4 Follow-up Not Indicated  Financial Strain Interventions Intervention Not Indicated --  Physical Activity Interventions Intervention Not Indicated --  Stress Interventions Intervention Not Indicated --       Medication Assistance: None required.  Patient affirms current coverage meets needs.  Medication Access: Within the past 30 days, how often has patient  missed a dose of medication? None Is a pillbox or other method used to improve adherence? Yes  Factors that may affect medication adherence? no barriers identified Are meds synced by current pharmacy? No  Are meds delivered by current pharmacy? Yes  Does patient experience delays in picking up medications due to transportation concerns? No   [facility] Services Reviewed: Is patient disadvantaged to use [facility]?: Yes  Current Rx insurance plan: T[employer] Name and location of Current pharmacy:  [facility] - [place], [place] - [address] [place] [address] Phone: [phone] Fax: [phone]  [id number] - [place], [place] - [address] [place] [address] Phone: [phone] Fax: [phone]  [facility] services reviewed with patient today?: No  Patient requests to transfer care to [facility]?: No  Reason patient declined to change pharmacies: Disadvantaged due to insurance/mail order  Compliance/Adherence/Medication fill history: Care Gaps: AWV - completed 09/16/2022 Last BP - 118/70 on 04/07/2023 Tdap - overdue Covid - overdue  [facility] Drugs: Rosuvastatin 5 mg - last filled 03/01/2023 [facility]    Assessment/Plan   Hypertension (BP goal <140/90) -Controlled -Current treatment: See HF Section   Atrial Fibrillation (Goal: prevent stroke and major bleeding) -Controlled -CHADSVASC: 5 -Current treatment: Eliquis 2.5mg  BID Appropriate, Effective, Safe, Accessible -Medications previously tried: Amiodarone, Bystolic, Metoprolol -Home BP and HR readings: none to report, denies any episodes of afib -Counseled on increased risk of stroke due to Afib and benefits of anticoagulation for stroke prevention; importance of adherence to anticoagulant exactly as prescribed; bleeding risk associated with Eliquis and importance of self-monitoring for signs/symptoms of  bleeding; avoidance of NSAIDs due to increased bleeding risk with anticoagulants; seeking medical attention after a head injury or if there is blood in the urine/stool; -Recommended to continue current medication  Hyperlipidemia: (LDL goal < 70) -Controlled -Current treatment: Rosuvastatin 5mg  1/2 tab daily Appropriate, Effective, Safe, Accessible Ezetimibe 10mg  1 qd Appropriate, Effective, Safe, Accessible -Medications previously tried: None  -Current dietary patterns: eats raisin bran cereal with a banana for breakfast, sandwich for lunch, meat and veggie for dinner -Current exercise habits: PT comes out to home 2x/week and does strength training exercises with her -Educated on Cholesterol goals;  Benefits of statin for ASCVD risk reduction; -Recommended to continue current medication   GERD (Goal: minimize symptoms of reflux ) -Not assessed today -Current treatment  None - stopped omeprazole and denies any symptoms  Hypothyroidism (Goal TSH: 0.4-4.5 -Not assessed today -Current treatment: Levothyroxine 1 qd Appropriate, Effective, Safe, Accessible  Osteoporosis / Osteopenia (Goal Prevent bone loss and bone fracture) -Controlled -Last DEXA Scan: 02/28/20   T-Score femoral neck: -2.7 RFn -Patient is a candidate for pharmacologic treatment due to T-Score < -2.5 in femoral neck -Current treatment  Prolia every 6 months (last 02/10/23, appt for 07/2023 sch) Appropriate, Effective, Safe, Accessible Calcium 600mg  + Vit D 400 IU once daily Appropriate, Effective, Safe, Accessible -Medications previously tried: Alendronate (since at least 2008-2012) -Recommend  [id number] units of vitamin D daily. Recommend 1200 mg of calcium daily from dietary and supplemental sources. Recommend weight-bearing and muscle strengthening exercises for building and maintaining bone density. -Recommended to continue current medication  Allergic Rhinitis  -Not assessed today -Current treatment   Albuterol HFA 2 puffs q 6 h prn Appropriate, Effective, Safe, Accessible Ipratropium 0.06% nasal spray Appropriate, Effective, Safe, Accessible Saline nasal spray prn Appropriate, Effective, Safe, Accessible  OTC  -Current treatment  Vitamin B12 1 qd Appropriate, Effective, Safe, Accessible Melatonin 3mg  1 qhs prn Appropriate, Effective, Safe, Accessible Vitamin D 5000 units 1qd Query Appropriate (request new lab order from PCP)       Sherrill Raring Clinical Pharmacist [phone]

## 2023-04-28 NOTE — Progress Notes (Signed)
Care Management & Coordination Services Pharmacy Team  Reason for Encounter: Appointment Reminder  Contacted patient to confirm in office appointment with Delano Metz, PharmD on 04/29/2023 at 9:30.  Spoke with patient on 04/28/2023   Do you have any problems getting your medications? Patient denies   What is your top health concern you would like to discuss at your upcoming visit? Patient denies   Have you seen any other providers since your last visit with PCP? Patient denies   Care Gaps: AWV - completed 09/16/2022 Last BP - 118/70 on 04/07/2023 Tdap - overdue Covid - overdue   [facility]:  Rosuvastatin 5 mg  - last filled 03/01/2023 [facility]  Inetta Fermo [facility]  Clinical Pharmacist Assistant [phone]

## 2023-04-29 ENCOUNTER — Ambulatory Visit: Payer: Medicare Other

## 2023-05-01 DIAGNOSIS — R278 Other lack of coordination: Secondary | ICD-10-CM | POA: Diagnosis not present

## 2023-05-01 DIAGNOSIS — Z9181 History of falling: Secondary | ICD-10-CM | POA: Diagnosis not present

## 2023-05-01 DIAGNOSIS — M1990 Unspecified osteoarthritis, unspecified site: Secondary | ICD-10-CM | POA: Diagnosis not present

## 2023-05-01 DIAGNOSIS — G629 Polyneuropathy, unspecified: Secondary | ICD-10-CM | POA: Diagnosis not present

## 2023-05-05 ENCOUNTER — Other Ambulatory Visit: Payer: Self-pay | Admitting: Cardiology

## 2023-05-05 DIAGNOSIS — I480 Paroxysmal atrial fibrillation: Secondary | ICD-10-CM

## 2023-05-05 NOTE — Telephone Encounter (Signed)
Eliquis 2.5mg  refill request received. Patient is 89 years old, weight-47.9kg, Crea-1.10 on 12/30/22, Diagnosis-Afib, and last seen by Dr. Jens Som on 04/07/23. Dose is appropriate based on dosing criteria. Will send in refill to requested pharmacy.

## 2023-05-12 DIAGNOSIS — M1990 Unspecified osteoarthritis, unspecified site: Secondary | ICD-10-CM | POA: Diagnosis not present

## 2023-05-12 DIAGNOSIS — G629 Polyneuropathy, unspecified: Secondary | ICD-10-CM | POA: Diagnosis not present

## 2023-05-12 DIAGNOSIS — Z9181 History of falling: Secondary | ICD-10-CM | POA: Diagnosis not present

## 2023-05-12 DIAGNOSIS — R278 Other lack of coordination: Secondary | ICD-10-CM | POA: Diagnosis not present

## 2023-05-15 DIAGNOSIS — Z9181 History of falling: Secondary | ICD-10-CM | POA: Diagnosis not present

## 2023-05-15 DIAGNOSIS — G629 Polyneuropathy, unspecified: Secondary | ICD-10-CM | POA: Diagnosis not present

## 2023-05-15 DIAGNOSIS — M1990 Unspecified osteoarthritis, unspecified site: Secondary | ICD-10-CM | POA: Diagnosis not present

## 2023-05-15 DIAGNOSIS — R278 Other lack of coordination: Secondary | ICD-10-CM | POA: Diagnosis not present

## 2023-05-19 DIAGNOSIS — M1990 Unspecified osteoarthritis, unspecified site: Secondary | ICD-10-CM | POA: Diagnosis not present

## 2023-05-19 DIAGNOSIS — Z9181 History of falling: Secondary | ICD-10-CM | POA: Diagnosis not present

## 2023-05-19 DIAGNOSIS — R278 Other lack of coordination: Secondary | ICD-10-CM | POA: Diagnosis not present

## 2023-05-19 DIAGNOSIS — G629 Polyneuropathy, unspecified: Secondary | ICD-10-CM | POA: Diagnosis not present

## 2023-05-20 ENCOUNTER — Telehealth: Payer: Self-pay | Admitting: Internal Medicine

## 2023-05-20 NOTE — Telephone Encounter (Signed)
Patient mailed document handicap placard form to be filled out by provider. Patient requested to send it via Mail within 7-days. Document is located in providers tray at front office.Please advise at home phone [phone]

## 2023-05-21 DIAGNOSIS — R278 Other lack of coordination: Secondary | ICD-10-CM | POA: Diagnosis not present

## 2023-05-21 DIAGNOSIS — M1990 Unspecified osteoarthritis, unspecified site: Secondary | ICD-10-CM | POA: Diagnosis not present

## 2023-05-21 DIAGNOSIS — G629 Polyneuropathy, unspecified: Secondary | ICD-10-CM | POA: Diagnosis not present

## 2023-05-21 DIAGNOSIS — Z9181 History of falling: Secondary | ICD-10-CM | POA: Diagnosis not present

## 2023-05-21 NOTE — Telephone Encounter (Signed)
Attempted to reach pt twice. Both times. Phone hung up. Will try again.

## 2023-05-22 NOTE — Telephone Encounter (Signed)
Attempted to reach pt twice. Phone got hung up.

## 2023-05-26 DIAGNOSIS — M1990 Unspecified osteoarthritis, unspecified site: Secondary | ICD-10-CM | POA: Diagnosis not present

## 2023-05-26 DIAGNOSIS — G629 Polyneuropathy, unspecified: Secondary | ICD-10-CM | POA: Diagnosis not present

## 2023-05-26 DIAGNOSIS — Z9181 History of falling: Secondary | ICD-10-CM | POA: Diagnosis not present

## 2023-05-26 DIAGNOSIS — H10503 Unspecified blepharoconjunctivitis, bilateral: Secondary | ICD-10-CM | POA: Diagnosis not present

## 2023-05-26 DIAGNOSIS — R278 Other lack of coordination: Secondary | ICD-10-CM | POA: Diagnosis not present

## 2023-05-27 NOTE — Telephone Encounter (Signed)
Attempted to reach pt. Left voicemail detail message the form is ready to be pick up at the front desk. To give [place]  a call if you any question.

## 2023-05-27 NOTE — Telephone Encounter (Signed)
Pt will be here tomorrow afternoon to pick it up.

## 2023-05-28 DIAGNOSIS — Z9181 History of falling: Secondary | ICD-10-CM | POA: Diagnosis not present

## 2023-05-28 DIAGNOSIS — G629 Polyneuropathy, unspecified: Secondary | ICD-10-CM | POA: Diagnosis not present

## 2023-05-28 DIAGNOSIS — M1990 Unspecified osteoarthritis, unspecified site: Secondary | ICD-10-CM | POA: Diagnosis not present

## 2023-05-28 DIAGNOSIS — R278 Other lack of coordination: Secondary | ICD-10-CM | POA: Diagnosis not present

## 2023-06-02 DIAGNOSIS — R278 Other lack of coordination: Secondary | ICD-10-CM | POA: Diagnosis not present

## 2023-06-02 DIAGNOSIS — G629 Polyneuropathy, unspecified: Secondary | ICD-10-CM | POA: Diagnosis not present

## 2023-06-02 DIAGNOSIS — Z9181 History of falling: Secondary | ICD-10-CM | POA: Diagnosis not present

## 2023-06-02 DIAGNOSIS — M1990 Unspecified osteoarthritis, unspecified site: Secondary | ICD-10-CM | POA: Diagnosis not present

## 2023-06-03 DIAGNOSIS — Z961 Presence of intraocular lens: Secondary | ICD-10-CM | POA: Diagnosis not present

## 2023-06-03 DIAGNOSIS — H10503 Unspecified blepharoconjunctivitis, bilateral: Secondary | ICD-10-CM | POA: Diagnosis not present

## 2023-06-09 DIAGNOSIS — R278 Other lack of coordination: Secondary | ICD-10-CM | POA: Diagnosis not present

## 2023-06-09 DIAGNOSIS — M1990 Unspecified osteoarthritis, unspecified site: Secondary | ICD-10-CM | POA: Diagnosis not present

## 2023-06-09 DIAGNOSIS — G629 Polyneuropathy, unspecified: Secondary | ICD-10-CM | POA: Diagnosis not present

## 2023-06-09 DIAGNOSIS — Z9181 History of falling: Secondary | ICD-10-CM | POA: Diagnosis not present

## 2023-06-11 DIAGNOSIS — M1990 Unspecified osteoarthritis, unspecified site: Secondary | ICD-10-CM | POA: Diagnosis not present

## 2023-06-11 DIAGNOSIS — G629 Polyneuropathy, unspecified: Secondary | ICD-10-CM | POA: Diagnosis not present

## 2023-06-11 DIAGNOSIS — R278 Other lack of coordination: Secondary | ICD-10-CM | POA: Diagnosis not present

## 2023-06-11 DIAGNOSIS — Z9181 History of falling: Secondary | ICD-10-CM | POA: Diagnosis not present

## 2023-06-16 DIAGNOSIS — M1990 Unspecified osteoarthritis, unspecified site: Secondary | ICD-10-CM | POA: Diagnosis not present

## 2023-06-16 DIAGNOSIS — G629 Polyneuropathy, unspecified: Secondary | ICD-10-CM | POA: Diagnosis not present

## 2023-06-16 DIAGNOSIS — R278 Other lack of coordination: Secondary | ICD-10-CM | POA: Diagnosis not present

## 2023-06-16 DIAGNOSIS — Z9181 History of falling: Secondary | ICD-10-CM | POA: Diagnosis not present

## 2023-06-19 DIAGNOSIS — Z9181 History of falling: Secondary | ICD-10-CM | POA: Diagnosis not present

## 2023-06-19 DIAGNOSIS — M1990 Unspecified osteoarthritis, unspecified site: Secondary | ICD-10-CM | POA: Diagnosis not present

## 2023-06-19 DIAGNOSIS — R278 Other lack of coordination: Secondary | ICD-10-CM | POA: Diagnosis not present

## 2023-06-19 DIAGNOSIS — G629 Polyneuropathy, unspecified: Secondary | ICD-10-CM | POA: Diagnosis not present

## 2023-06-23 DIAGNOSIS — R278 Other lack of coordination: Secondary | ICD-10-CM | POA: Diagnosis not present

## 2023-06-23 DIAGNOSIS — G629 Polyneuropathy, unspecified: Secondary | ICD-10-CM | POA: Diagnosis not present

## 2023-06-23 DIAGNOSIS — Z9181 History of falling: Secondary | ICD-10-CM | POA: Diagnosis not present

## 2023-06-23 DIAGNOSIS — M1990 Unspecified osteoarthritis, unspecified site: Secondary | ICD-10-CM | POA: Diagnosis not present

## 2023-06-25 DIAGNOSIS — M1990 Unspecified osteoarthritis, unspecified site: Secondary | ICD-10-CM | POA: Diagnosis not present

## 2023-06-25 DIAGNOSIS — G629 Polyneuropathy, unspecified: Secondary | ICD-10-CM | POA: Diagnosis not present

## 2023-06-25 DIAGNOSIS — R278 Other lack of coordination: Secondary | ICD-10-CM | POA: Diagnosis not present

## 2023-06-25 DIAGNOSIS — Z9181 History of falling: Secondary | ICD-10-CM | POA: Diagnosis not present

## 2023-06-30 DIAGNOSIS — R278 Other lack of coordination: Secondary | ICD-10-CM | POA: Diagnosis not present

## 2023-06-30 DIAGNOSIS — G629 Polyneuropathy, unspecified: Secondary | ICD-10-CM | POA: Diagnosis not present

## 2023-06-30 DIAGNOSIS — Z9181 History of falling: Secondary | ICD-10-CM | POA: Diagnosis not present

## 2023-06-30 DIAGNOSIS — M1990 Unspecified osteoarthritis, unspecified site: Secondary | ICD-10-CM | POA: Diagnosis not present

## 2023-07-06 DIAGNOSIS — R278 Other lack of coordination: Secondary | ICD-10-CM | POA: Diagnosis not present

## 2023-07-06 DIAGNOSIS — M1990 Unspecified osteoarthritis, unspecified site: Secondary | ICD-10-CM | POA: Diagnosis not present

## 2023-07-06 DIAGNOSIS — G629 Polyneuropathy, unspecified: Secondary | ICD-10-CM | POA: Diagnosis not present

## 2023-07-06 DIAGNOSIS — Z9181 History of falling: Secondary | ICD-10-CM | POA: Diagnosis not present

## 2023-07-09 ENCOUNTER — Telehealth: Payer: Self-pay | Admitting: Cardiology

## 2023-07-09 DIAGNOSIS — R278 Other lack of coordination: Secondary | ICD-10-CM | POA: Diagnosis not present

## 2023-07-09 DIAGNOSIS — Z9181 History of falling: Secondary | ICD-10-CM | POA: Diagnosis not present

## 2023-07-09 DIAGNOSIS — M1990 Unspecified osteoarthritis, unspecified site: Secondary | ICD-10-CM | POA: Diagnosis not present

## 2023-07-09 DIAGNOSIS — G629 Polyneuropathy, unspecified: Secondary | ICD-10-CM | POA: Diagnosis not present

## 2023-07-09 NOTE — Telephone Encounter (Signed)
Pt c/o Shortness Of Breath: STAT if SOB developed within the last 24 hours or pt is noticeably SOB on the phone  1. Are you currently SOB (can you hear that pt is SOB on the phone)? yes  2. How long have you been experiencing SOB? Tuesday, its getting worse  3. Are you SOB when sitting or when up moving around?  both  4. Are you currently experiencing any other symptoms?

## 2023-07-09 NOTE — Telephone Encounter (Signed)
Patient states SOB several days and seems to be worse today.  Could not do therapy. Edema to bilateral feet.  Took lasix this morning. Lasix 20 mg.  First time she has taken since October. This was prescribed when in ED in December to take Lasix 20 mg PRN. Per last OV Occasional minimal pedal edema, but states worse than normal.  She was unable to do PT today as SOB. Able to hear SOB when she is up to take weight and when talking.  Resolves when resting and not speaking. Appt made for tomorrow to evaluate.  Last weight 06/29/23 105 Weight now 109 but is clothed. She will take weight in the morning . She will not take Lasix again in the morning due to concerns of frequency in urination and getting to her appt. She is advised if SOB worsens or to point of distress, to call 911.  Patient states understanding.

## 2023-07-10 ENCOUNTER — Ambulatory Visit: Payer: Medicare Other | Attending: Physician Assistant | Admitting: Physician Assistant

## 2023-07-10 ENCOUNTER — Encounter: Payer: Self-pay | Admitting: Physician Assistant

## 2023-07-10 VITALS — BP 118/70 | HR 74 | Ht 61.0 in | Wt 103.2 lb

## 2023-07-10 DIAGNOSIS — I480 Paroxysmal atrial fibrillation: Secondary | ICD-10-CM | POA: Insufficient documentation

## 2023-07-10 DIAGNOSIS — I10 Essential (primary) hypertension: Secondary | ICD-10-CM | POA: Diagnosis not present

## 2023-07-10 DIAGNOSIS — E785 Hyperlipidemia, unspecified: Secondary | ICD-10-CM | POA: Insufficient documentation

## 2023-07-10 DIAGNOSIS — Z9889 Other specified postprocedural states: Secondary | ICD-10-CM | POA: Diagnosis not present

## 2023-07-10 DIAGNOSIS — I2510 Atherosclerotic heart disease of native coronary artery without angina pectoris: Secondary | ICD-10-CM

## 2023-07-10 MED ORDER — FUROSEMIDE 20 MG PO TABS: ORAL_TABLET | ORAL | 0 refills | Status: DC

## 2023-07-10 NOTE — Patient Instructions (Addendum)
Medication Instructions:  TAKE LASIX AS NEEDED FOR SHORTNESS OF BREATHE OR LEG EDEMA *If you need a refill on your cardiac medications before your next appointment, please call your pharmacy*   Lab Work: NONE If you have labs (blood work) drawn today and your tests are completely normal, you will receive your results only by: MyChart Message (if you have MyChart) OR A paper copy in the mail If you have any lab test that is abnormal or we need to change your treatment, we will call you to review the results.   Testing/Procedures: NONE   Follow-Up: At [facility], you and your health needs are our priority.  As part of our continuing mission to provide you with exceptional heart care, we have created designated Provider Care Teams.  These Care Teams include your primary Cardiologist (physician) and Advanced Practice Providers (APPs -  Physician Assistants and Nurse Practitioners) who all work together to provide you with the care you need, when you need it.  We recommend signing up for the patient portal called "MyChart".  Sign up information is provided on this After Visit Summary.  MyChart is used to connect with patients for Virtual Visits (Telemedicine).  Patients are able to view lab/test results, encounter notes, upcoming appointments, etc.  Non-urgent messages can be sent to your provider as well.   To learn more about what you can do with MyChart, go to [email or web address].    Your next appointment:   3 month(s)  Provider:   Azalee Course, PA-C

## 2023-07-10 NOTE — Progress Notes (Unsigned)
Cardiology Office Note:  .   Date:  07/12/2023  ID:  Amy Fischer, DOB 1929-06-05, MRN [id number] PCP: Madelin Headings, MD  [facility] Providers Cardiologist:  Olga Millers, MD     History of Present Illness: .   Amy Fischer is a 89 y.o. female with past medical history of nonobstructive CAD, severe MR s/p repair, PAF, hypertension and hyperlipidemia.  He has been followed by Dr. Jens Som.  Patient was admitted in October 2016 with congestive heart failure.  Echocardiogram at that time showed normal EF, flail posterior leaflet with severe MR, mild to moderate TR.  Subsequent cardiac catheterization revealed 50% LAD, 60% D1-30% RCA lesion, EF 60%.  She underwent mitral valve repair.  Preoperative carotid Doppler showed 1 to 39% disease.  She had atrial fibrillation after the surgery.  Follow-up heart monitor in March 2019 demonstrated sinus rhythm and paroxysmal A-fib/atrial flutter.  She was placed on Eliquis and amiodarone. She was previously admitted for acute heart failure near the end of 2023 and the beginning of 2024.  She was discharged on 20 mg PRN Lasix. Echocardiogram obtained in January 2024 showed normal EF, mild LVH, mild RV dysfunction, mild right atrial enlargement, stable mitral valve repair with mild MR, mild to moderate TR. Patient was last seen by Dr. Jens Som again in April 2024 at which time she was doing well.  Patient presents today for follow-up.  She started accumulating fluid since Tuesday, daughter noticed she was more short of breath the day before yesterday and they instructed her to take a single dose of oral Lasix 20 mg.  She had good urine output.  On physical exam, she no longer has any significant lower extremity edema.  She has some pedal edema bilaterally.  She says her left LE edema is worse than the right side.  She has short of breath with some orthopnea, however symptom has resolved after significant urine output yesterday.  Her lung is clear on  physical exam.  She has no chest pain.  Given her advanced age, I did not recommend any further workup.  On physical exam, her heart rate is irregular, however EKG showed she was in sinus rhythm with PACs and PVCs.  There has been no sign of recurrent A-fib.  She can follow-up in 3 months for reassessment.  ROS:   She denies chest pain, palpitations, pnd, n, v, dizziness, syncope, weight gain, or early satiety. All other systems reviewed and are otherwise negative except as noted above.   Patient had lower extremity edema, dyspnea and orthopnea, symptom has since resolved.  Studies Reviewed: [name]   EKG Interpretation Date/Time:  Friday July 10 2023 16:26:59 EDT Ventricular Rate:  74 PR Interval:  198 QRS Duration:  82 QT Interval:  428 QTC Calculation: 475 R Axis:   44  Text Interpretation: Sinus rhythm with occasional Premature ventricular complexes and Premature atrial complexes Confirmed by Azalee Course [phone]) on 07/10/2023 4:30:49 PM    Cardiac Studies & Procedures   CARDIAC CATHETERIZATION  CARDIAC CATHETERIZATION 10/04/2015  Narrative 1. Prox LAD to Mid LAD lesion, 30% stenosed. 2. Ost 1st Diag to 1st Diag lesion, 60% stenosed. 3. Prox RCA to Mid RCA lesion, 30% stenosed.   Moderate to severe mitral regurgitation by ventriculography.  Preserved left ventricular systolic function with ejection fraction of 60%. No regional wall motion abnormality is noted.  Widely patent coronary arteries with minimal luminal irregularities noted in the LAD, diagonal, and mid right coronary.  Mild pulmonary  hypertension. Significant V wave spiking up to 35 mmHg.   Recommendations:   Surgical therapy per [facility], Dr. Dorris Fetch.  Sheath removal by manual compression of the right femoral area.  Monitor for evidence of hematoma.  Findings Coronary Findings Diagnostic  Dominance: Right  Left Anterior Descending calcified eccentric.  First Diagonal Branch calcified eccentric.  Right  Coronary Artery eccentric.  Intervention  No interventions have been documented.     ECHOCARDIOGRAM  ECHOCARDIOGRAM COMPLETE 12/29/2022  Narrative ECHOCARDIOGRAM REPORT    Patient Name:   Amy Fischer Date of Exam: 12/29/2022 Medical Rec #:  [id number]       Height:       61.0 in Accession #:    [id number]      Weight:       105.0 lb Date of Birth:  June 23, 1929        BSA:          1.436 m Patient Age:    89 years        BP:           131/84 mmHg Patient Gender: F               HR:           79 bpm. Exam Location:  Inpatient  Procedure: 2D Echo, Color Doppler and Cardiac Doppler  Indications:    CHF - Acute Diastolic  History:        Patient has prior history of Echocardiogram examinations, most recent 04/20/2018. CHF, Arrythmias:Atrial Fibrillation, Signs/Symptoms:Shortness of Breath; Risk Factors:Hypertension and Dyslipidemia.  Mitral Valve: 28 mm Carpentier-Edwards Physio II prosthetic annuloplasty ring valve is present in the mitral position. Procedure Date: 10/10/2015.  Sonographer:    Milda Smart Referring Phys: [id number] RONDELL A SMITH   Sonographer Comments: Technically difficult study due to poor echo windows. Image acquisition challenging due to patient body habitus and Image acquisition challenging due to respiratory motion. IMPRESSIONS   1. Left ventricular ejection fraction, by estimation, is 65 to 70%. The left ventricle has normal function. The left ventricle has no regional wall motion abnormalities. There is mild left ventricular hypertrophy. Left ventricular diastolic parameters are indeterminate. 2. Right ventricular systolic function is mildly reduced. The right ventricular size is normal. There is mildly elevated pulmonary artery systolic pressure. The estimated right ventricular systolic pressure is 35.0 mmHg. 3. Right atrial size was mildly dilated. 4. The mitral valve has been repaired. Mild mitral valve regurgitation. Mild mitral stenosis. The  mean mitral valve gradient is 5.0 mmHg with average heart rate of 77 bpm. There is a 28 mm Carpentier-Edwards Physio II prosthetic annuloplasty ring present in the mitral position. Procedure Date: 10/10/2015. 5. Tricuspid valve regurgitation is mild to moderate. 6. The aortic valve is tricuspid. Aortic valve regurgitation is not visualized. No aortic stenosis is present. 7. The inferior vena cava is normal in size with greater than 50% respiratory variability, suggesting right atrial pressure of 3 mmHg.  FINDINGS Left Ventricle: Left ventricular ejection fraction, by estimation, is 65 to 70%. The left ventricle has normal function. The left ventricle has no regional wall motion abnormalities. The left ventricular internal cavity size was small. There is mild left ventricular hypertrophy. Left ventricular diastolic parameters are indeterminate.  Right Ventricle: The right ventricular size is normal. Right vetricular wall thickness was not well visualized. Right ventricular systolic function is mildly reduced. There is mildly elevated pulmonary artery systolic pressure. The tricuspid regurgitant velocity is 2.83 m/s, and with an assumed right  atrial pressure of 3 mmHg, the estimated right ventricular systolic pressure is 35.0 mmHg.  Left Atrium: Left atrial size was normal in size.  Right Atrium: Right atrial size was mildly dilated.  Pericardium: There is no evidence of pericardial effusion.  Mitral Valve: The mitral valve has been repaired/replaced. Mild mitral valve regurgitation. There is a 28 mm Carpentier-Edwards Physio II prosthetic annuloplasty ring present in the mitral position. Procedure Date: 10/10/2015. Mild mitral valve stenosis. MV peak gradient, 9.9 mmHg. The mean mitral valve gradient is 5.0 mmHg with average heart rate of 77 bpm.  Tricuspid Valve: The tricuspid valve is normal in structure. Tricuspid valve regurgitation is mild to moderate.  Aortic Valve: The aortic valve is  tricuspid. Aortic valve regurgitation is not visualized. No aortic stenosis is present.  Pulmonic Valve: The pulmonic valve was not well visualized. Pulmonic valve regurgitation is not visualized.  Aorta: The aortic root and ascending aorta are structurally normal, with no evidence of dilitation.  Venous: The inferior vena cava is normal in size with greater than 50% respiratory variability, suggesting right atrial pressure of 3 mmHg.  IAS/Shunts: The interatrial septum was not well visualized.   LEFT VENTRICLE PLAX 2D LVIDd:         3.50 cm   Diastology LVIDs:         2.30 cm   LV e' medial:  4.13 cm/s LV PW:         0.90 cm   LV e' lateral: 6.64 cm/s LV IVS:        1.20 cm LVOT diam:     2.00 cm LV SV:         52 LV SV Index:   36 LVOT Area:     3.14 cm   RIGHT VENTRICLE            IVC RV S prime:     9.03 cm/s  IVC diam: 1.30 cm TAPSE (M-mode): 1.7 cm  LEFT ATRIUM           Index        RIGHT ATRIUM           Index LA diam:      4.20 cm 2.92 cm/m   RA Area:     15.60 cm LA Vol (A4C): 43.4 ml 30.22 ml/m  RA Volume:   43.50 ml  30.29 ml/m AORTIC VALVE LVOT Vmax:   74.30 cm/s LVOT Vmean:  50.400 cm/s LVOT VTI:    0.164 m  AORTA Ao Root diam: 2.80 cm Ao Asc diam:  2.60 cm  MITRAL VALVE              TRICUSPID VALVE MV Area VTI:  0.86 cm    TR Peak grad:   32.0 mmHg MV Peak grad: 9.9 mmHg    TR Mean grad:   23.0 mmHg MV Mean grad: 5.0 mmHg    TR Vmax:        283.00 cm/s MV Vmax:      1.57 m/s    TR Vmean:       231.0 cm/s MV Vmean:     89.6 cm/s MR Peak grad: 57.9 mmHg   SHUNTS MR Vmax:      380.50 cm/s Systemic VTI:  0.16 m Systemic Diam: 2.00 cm  Epifanio Lesches MD Electronically signed by Epifanio Lesches MD Signature Date/Time: 12/29/2022/1:05:38 PM    Final   TEE  ECHO TEE 10/10/2015  Narrative *[place]* *[facility]* [address] [place], [place]  [address] [phone]  ------------------------------------------------------------------- Intraoperative Transesophageal Echocardiography  Patient:    Peris, Shafiq MR #:       [id number] Study Date: 10/10/2015 Gender:     F Age:        11 Height: Weight: BSA: Pt. Status: Room:       2W24C  ATTENDING    Charlett Lango, MD ORDERING     Charlett Lango, MD REFERRING    Charlett Lango, MD SONOGRAPHER  Sherren Kerns, RVS PERFORMING   Judie Petit, MD SONOGRAPHER  Arvil Chaco ADMITTING    Joen Laura P  cc:  ------------------------------------------------------------------- LV EF: 50% -   55%  ------------------------------------------------------------------- Indications:     Mitral valve repair/replacement  ------------------------------------------------------------------- Study Conclusions  - Left ventricle: Systolic function was normal. The estimated ejection fraction was in the range of 50% to 55%. - Aortic valve: No evidence of vegetation. - Mitral valve: Flail motion. There was moderate to severe regurgitation. - Right atrium: No evidence of thrombus in the appendage. - Atrial septum: No defect or patent foramen ovale was identified. Echo contrast study showed no right-to-left atrial level shunt, following an increase in RA pressure induced by provocative maneuvers.  Intraoperative transesophageal echocardiography.  Birthdate: Patient birthdate: [birth year].  Age:  Patient is 89 yr old.  Sex: Gender: female.  Study date:  Study date: 10/10/2015. Study time: 07:56 AM.  -------------------------------------------------------------------  ------------------------------------------------------------------- Left ventricle:  Systolic function was normal. The estimated ejection fraction was in the range of 50% to 55%.  ------------------------------------------------------------------- Aortic valve:   Structurally normal valve.   Cusp  separation was normal.  No evidence of vegetation.  Doppler:  There was no regurgitation.  ------------------------------------------------------------------- Mitral valve:  Flail motion.  Doppler:  There was moderate to severe regurgitation.  ------------------------------------------------------------------- Atrial septum:  No defect or patent foramen ovale was identified. Echo contrast study showed no right-to-left atrial level shunt, following an increase in RA pressure induced by provocative maneuvers.  ------------------------------------------------------------------- Tricuspid valve:   Doppler:  There was mild regurgitation.  ------------------------------------------------------------------- Right atrium:   No evidence of thrombus in the appendage. The appendage was normal sized.  ------------------------------------------------------------------- Post bypass:  ------------------------------------------------------------------- Prepared and Electronically Authenticated by  Judie Petit, MD 2016-10-21T01:29:30   MONITORS  LONG TERM MONITOR (3-14 DAYS) 02/03/2020  Narrative Sinus with occasional PAC, PVC and brief PAT .Olga Millers           Risk Assessment/Calculations:    CHA2DS2-VASc Score = 5   This indicates a 7.2% annual risk of stroke. The patient's score is based upon: CHF History: 0 HTN History: 1 Diabetes History: 0 Stroke History: 0 Vascular Disease History: 1 Age Score: 2 Gender Score: 1           Physical Exam:   VS:  BP 118/70 (BP Location: Left Arm, Patient Position: Sitting, Cuff Size: Normal)   Pulse 74   Ht 5\' 1"  (1.549 m)   Wt 103 lb 3.2 oz (46.8 kg)   SpO2 90%   BMI 19.50 kg/m    Wt Readings from Last 3 Encounters:  07/10/23 103 lb 3.2 oz (46.8 kg)  04/07/23 105 lb 9.6 oz (47.9 kg)  03/18/23 101 lb (45.8 kg)    GEN: Well nourished, well developed in no acute distress NECK: No JVD; No carotid bruits CARDIAC: RRR, no  murmurs, rubs, gallops RESPIRATORY:  Clear to auscultation without rales, wheezing or rhonchi  ABDOMEN: Soft, non-tender, non-distended EXTREMITIES:  No edema; No deformity   ASSESSMENT  AND PLAN: .    PAF: On Eliquis.  EKG showed normal sinus rhythm.  CAD: Patient denies any chest pain.  On Crestor and Zetia  Mitral valve repair: Last echocardiogram in January 2024 showed stable mitral valve repair with mild MR  Hypertension: Blood pressure stable  Hyperlipidemia: On Crestor and Zetia       Dispo: Follow-up in 3 months.  Signed, Azalee Course, PA

## 2023-07-13 DIAGNOSIS — R278 Other lack of coordination: Secondary | ICD-10-CM | POA: Diagnosis not present

## 2023-07-13 DIAGNOSIS — M1990 Unspecified osteoarthritis, unspecified site: Secondary | ICD-10-CM | POA: Diagnosis not present

## 2023-07-13 DIAGNOSIS — G629 Polyneuropathy, unspecified: Secondary | ICD-10-CM | POA: Diagnosis not present

## 2023-07-13 DIAGNOSIS — Z9181 History of falling: Secondary | ICD-10-CM | POA: Diagnosis not present

## 2023-07-17 DIAGNOSIS — M1990 Unspecified osteoarthritis, unspecified site: Secondary | ICD-10-CM | POA: Diagnosis not present

## 2023-07-17 DIAGNOSIS — Z9181 History of falling: Secondary | ICD-10-CM | POA: Diagnosis not present

## 2023-07-17 DIAGNOSIS — G629 Polyneuropathy, unspecified: Secondary | ICD-10-CM | POA: Diagnosis not present

## 2023-07-17 DIAGNOSIS — R278 Other lack of coordination: Secondary | ICD-10-CM | POA: Diagnosis not present

## 2023-07-21 DIAGNOSIS — R278 Other lack of coordination: Secondary | ICD-10-CM | POA: Diagnosis not present

## 2023-07-21 DIAGNOSIS — M1990 Unspecified osteoarthritis, unspecified site: Secondary | ICD-10-CM | POA: Diagnosis not present

## 2023-07-21 DIAGNOSIS — Z9181 History of falling: Secondary | ICD-10-CM | POA: Diagnosis not present

## 2023-07-21 DIAGNOSIS — G629 Polyneuropathy, unspecified: Secondary | ICD-10-CM | POA: Diagnosis not present

## 2023-07-23 DIAGNOSIS — R278 Other lack of coordination: Secondary | ICD-10-CM | POA: Diagnosis not present

## 2023-07-23 DIAGNOSIS — M1990 Unspecified osteoarthritis, unspecified site: Secondary | ICD-10-CM | POA: Diagnosis not present

## 2023-07-23 DIAGNOSIS — Z9181 History of falling: Secondary | ICD-10-CM | POA: Diagnosis not present

## 2023-07-23 DIAGNOSIS — G629 Polyneuropathy, unspecified: Secondary | ICD-10-CM | POA: Diagnosis not present

## 2023-07-29 ENCOUNTER — Encounter (INDEPENDENT_AMBULATORY_CARE_PROVIDER_SITE_OTHER): Payer: Self-pay

## 2023-08-02 ENCOUNTER — Other Ambulatory Visit: Payer: Self-pay | Admitting: Internal Medicine

## 2023-08-03 ENCOUNTER — Telehealth: Payer: Self-pay | Admitting: Internal Medicine

## 2023-08-03 MED ORDER — ROSUVASTATIN CALCIUM 5 MG PO TABS: 2.5000 mg | ORAL_TABLET | Freq: Every day | ORAL | 3 refills | Status: DC

## 2023-08-03 MED ORDER — ROSUVASTATIN CALCIUM 5 MG PO TABS: 2.5000 mg | ORAL_TABLET | Freq: Every day | ORAL | 0 refills | Status: DC

## 2023-08-03 NOTE — Telephone Encounter (Signed)
Rx is sent

## 2023-08-03 NOTE — Telephone Encounter (Signed)
Prescription Request  08/03/2023  LOV: 02/10/2023  What is the name of the medication or equipment? rosuvastatin (CRESTOR) 5 MG tablet . Pt would like 2 wk supply until mail order arrives  Have you contacted your pharmacy to request a refill? No   Which pharmacy would you like this sent to?   [id number] [address] [place] [address] Phone: [phone] Fax: [phone]  Home number [phone]  Patient notified that their request is being sent to the clinical staff for review and that they should receive a response within 2 business days.   Please advise at [facility]

## 2023-08-04 ENCOUNTER — Other Ambulatory Visit: Payer: Self-pay | Admitting: Internal Medicine

## 2023-08-04 DIAGNOSIS — R278 Other lack of coordination: Secondary | ICD-10-CM | POA: Diagnosis not present

## 2023-08-04 DIAGNOSIS — M1990 Unspecified osteoarthritis, unspecified site: Secondary | ICD-10-CM | POA: Diagnosis not present

## 2023-08-04 DIAGNOSIS — Z9181 History of falling: Secondary | ICD-10-CM | POA: Diagnosis not present

## 2023-08-04 DIAGNOSIS — G629 Polyneuropathy, unspecified: Secondary | ICD-10-CM | POA: Diagnosis not present

## 2023-08-11 ENCOUNTER — Telehealth: Payer: Self-pay

## 2023-08-11 ENCOUNTER — Other Ambulatory Visit: Payer: Medicare Other

## 2023-08-11 DIAGNOSIS — G629 Polyneuropathy, unspecified: Secondary | ICD-10-CM | POA: Diagnosis not present

## 2023-08-11 DIAGNOSIS — M1990 Unspecified osteoarthritis, unspecified site: Secondary | ICD-10-CM | POA: Diagnosis not present

## 2023-08-11 DIAGNOSIS — R278 Other lack of coordination: Secondary | ICD-10-CM | POA: Diagnosis not present

## 2023-08-11 DIAGNOSIS — Z9181 History of falling: Secondary | ICD-10-CM | POA: Diagnosis not present

## 2023-08-11 NOTE — Telephone Encounter (Signed)
Pt ready for scheduling on or after 8/14.  Estimated out-of-pocket cost due at time of visit: $0  Primary Insurance: Medicare  Prolia co-insurance: 0%  Secondary Insurance: Tricare  Deductible: $240 of $240 met.  Eligible for co-pay program: No  Prior Auth: N/A PA#:  Valid:   This summary of benefits is an estimation of the patient's out-of-pocket cost. Exact cost may vary based on individual plan coverage.

## 2023-08-12 ENCOUNTER — Other Ambulatory Visit (INDEPENDENT_AMBULATORY_CARE_PROVIDER_SITE_OTHER): Payer: Medicare Other

## 2023-08-12 ENCOUNTER — Other Ambulatory Visit: Payer: Self-pay | Admitting: Internal Medicine

## 2023-08-12 DIAGNOSIS — Z79899 Other long term (current) drug therapy: Secondary | ICD-10-CM

## 2023-08-12 DIAGNOSIS — M810 Age-related osteoporosis without current pathological fracture: Secondary | ICD-10-CM

## 2023-08-12 DIAGNOSIS — E039 Hypothyroidism, unspecified: Secondary | ICD-10-CM

## 2023-08-12 LAB — CBC WITH DIFFERENTIAL/PLATELET
Basophils Absolute: 0.1 10*3/uL (ref 0.0–0.1)
Basophils Relative: 0.8 % (ref 0.0–3.0)
Eosinophils Absolute: 0.1 10*3/uL (ref 0.0–0.7)
Eosinophils Relative: 1 % (ref 0.0–5.0)
HCT: 41.7 % (ref 36.0–46.0)
Hemoglobin: 13.6 g/dL (ref 12.0–15.0)
Lymphocytes Relative: 15.8 % (ref 12.0–46.0)
Lymphs Abs: 1.3 10*3/uL (ref 0.7–4.0)
MCHC: 32.6 g/dL (ref 30.0–36.0)
MCV: 97.8 fl (ref 78.0–100.0)
Monocytes Absolute: 0.8 10*3/uL (ref 0.1–1.0)
Monocytes Relative: 10.2 % (ref 3.0–12.0)
Neutro Abs: 5.8 10*3/uL (ref 1.4–7.7)
Neutrophils Relative %: 72.2 % (ref 43.0–77.0)
Platelets: 173 10*3/uL (ref 150.0–400.0)
RBC: 4.27 Mil/uL (ref 3.87–5.11)
RDW: 14.2 % (ref 11.5–15.5)
WBC: 8 10*3/uL (ref 4.0–10.5)

## 2023-08-12 LAB — BASIC METABOLIC PANEL
BUN: 16 mg/dL (ref 6–23)
CO2: 30 mEq/L (ref 19–32)
Calcium: 10.3 mg/dL (ref 8.4–10.5)
Chloride: 102 mEq/L (ref 96–112)
Creatinine, Ser: 0.79 mg/dL (ref 0.40–1.20)
GFR: 64.96 mL/min (ref >60.00)
Glucose, Bld: 107 mg/dL — ABNORMAL HIGH (ref 70–99)
Potassium: 4 mEq/L (ref 3.5–5.1)
Sodium: 138 mEq/L (ref 135–145)

## 2023-08-12 LAB — VITAMIN D 25 HYDROXY (VIT D DEFICIENCY, FRACTURES): VITD: 63.43 ng/mL (ref 30.00–100.00)

## 2023-08-12 LAB — TSH: TSH: 0.88 u[IU]/mL (ref 0.35–5.50)

## 2023-08-13 NOTE — Progress Notes (Signed)
Results   are stable or normal   will review at upcoming visit

## 2023-08-14 DIAGNOSIS — M1990 Unspecified osteoarthritis, unspecified site: Secondary | ICD-10-CM | POA: Diagnosis not present

## 2023-08-14 DIAGNOSIS — R278 Other lack of coordination: Secondary | ICD-10-CM | POA: Diagnosis not present

## 2023-08-14 DIAGNOSIS — Z9181 History of falling: Secondary | ICD-10-CM | POA: Diagnosis not present

## 2023-08-14 DIAGNOSIS — G629 Polyneuropathy, unspecified: Secondary | ICD-10-CM | POA: Diagnosis not present

## 2023-08-18 DIAGNOSIS — M1990 Unspecified osteoarthritis, unspecified site: Secondary | ICD-10-CM | POA: Diagnosis not present

## 2023-08-18 DIAGNOSIS — G629 Polyneuropathy, unspecified: Secondary | ICD-10-CM | POA: Diagnosis not present

## 2023-08-18 DIAGNOSIS — Z9181 History of falling: Secondary | ICD-10-CM | POA: Diagnosis not present

## 2023-08-18 DIAGNOSIS — R278 Other lack of coordination: Secondary | ICD-10-CM | POA: Diagnosis not present

## 2023-08-20 DIAGNOSIS — M1990 Unspecified osteoarthritis, unspecified site: Secondary | ICD-10-CM | POA: Diagnosis not present

## 2023-08-20 DIAGNOSIS — R278 Other lack of coordination: Secondary | ICD-10-CM | POA: Diagnosis not present

## 2023-08-20 DIAGNOSIS — Z9181 History of falling: Secondary | ICD-10-CM | POA: Diagnosis not present

## 2023-08-20 DIAGNOSIS — G629 Polyneuropathy, unspecified: Secondary | ICD-10-CM | POA: Diagnosis not present

## 2023-08-25 DIAGNOSIS — M1990 Unspecified osteoarthritis, unspecified site: Secondary | ICD-10-CM | POA: Diagnosis not present

## 2023-08-25 DIAGNOSIS — Z9181 History of falling: Secondary | ICD-10-CM | POA: Diagnosis not present

## 2023-08-25 DIAGNOSIS — R278 Other lack of coordination: Secondary | ICD-10-CM | POA: Diagnosis not present

## 2023-08-25 DIAGNOSIS — G629 Polyneuropathy, unspecified: Secondary | ICD-10-CM | POA: Diagnosis not present

## 2023-08-25 NOTE — Progress Notes (Deleted)
No chief complaint on file.   HPI: Amy Fischer 89 y.o. come in for Chronic disease management   Prolia injection and fu osteoporosis  ROS: See pertinent positives and negatives per HPI.  Past Medical History:  Diagnosis Date   Allergy    Anemia    Asymptomatic carotid artery stenosis    R ICA 40% stenosis on CTA 12/2011.   Bladder polyps    Cataract    BILATERAL-REMOVED   CHF (congestive heart failure) (HCC)    Diverticulosis    Elevated blood pressure 03/25/2011   Bp readings borderline today has hx of elvation  In office and ok at home   Not checke recently   She gfeels was elevated from anxiety     Episodic recurrent vertigo    MRI Head 2003   Esophageal spasm    GERD (gastroesophageal reflux disease)    Hepatic hemangioma    History of hepatitis    unknown type   Hyperlipidemia    Hyperplastic colon polyp    Hypothyroidism    Intestinal metaplasia of gastric mucosa    Osteoarthritis    Osteoporosis    Patellar fracture 07/13/2012   Scapular fracture 03/31/2012   small from direct blow with trip     Subclavian steal syndrome    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; CTA 2013 revealed open graft    Family History  Problem Relation Age of Onset   Hypertension Mother    Stroke Mother    Heart disease Father    Colon cancer Neg Hx    Neurofibromatosis Neg Hx    Allergic rhinitis Neg Hx    Asthma Neg Hx    Angioedema Neg Hx    Atopy Neg Hx    Eczema Neg Hx    Immunodeficiency Neg Hx    Urticaria Neg Hx     Social History   Socioeconomic History   Marital status: Widowed    Spouse name: Not on file   Number of children: 4   Years of education: Not on file   Highest education level: Bachelor's degree (e.g., BA, AB, BS)  Occupational History   Occupation: retired Runner, broadcasting/film/video  Tobacco Use   Smoking status: Former    Current packs/day: 0.00    Average packs/day: 0.8 packs/day for 20.0 years (15.0 ttl pk-yrs)    Types: Cigarettes    Start date:  01/05/1971    Quit date: 01/05/1991    Years since quitting: 32.6   Smokeless tobacco: Never  Vaping Use   Vaping status: Never Used  Substance and Sexual Activity   Alcohol use: Not Currently    Alcohol/week: 4.0 standard drinks of alcohol    Types: 4 Glasses of wine per week    Comment: socially   Drug use: No   Sexual activity: Not on file  Other Topics Concern   Not on file  Social History Narrative   Widowed.  Lives alone in a one story home.  Has 4 children (3 living).  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    HH of 1    No pets   Former smoker   Exercises regularly   Right handed   Social Determinants of Health   Financial Resource Strain: Low Risk  (09/16/2022)   Overall Financial Resource Strain (CARDIA)    Difficulty of Paying Living Expenses: Not hard at all  Food Insecurity: No Food Insecurity (04/29/2023)   Hunger Vital Sign    Worried About Running Out  of Food in the Last Year: Never true    Ran Out of Food in the Last Year: Never true  Transportation Needs: No Transportation Needs (12/29/2022)   PRAPARE - Administrator, Civil Service (Medical): No    Lack of Transportation (Non-Medical): No  Physical Activity: Sufficiently Active (09/16/2022)   Exercise Vital Sign    Days of Exercise per Week: 7 days    Minutes of Exercise per Session: 30 min  Stress: No Stress Concern Present (09/16/2022)   [employer] of Occupational Health - Occupational Stress Questionnaire    Feeling of Stress : Not at all  Social Connections: Moderately Integrated (09/03/2021)   Social Connection and Isolation Panel [NHANES]    Frequency of Communication with Friends and Family: More than three times a week    Frequency of Social Gatherings with Friends and Family: Three times a week    Attends Religious Services: More than 4 times per year    Active Member of Clubs or Organizations: Yes    Attends Banker Meetings: 1 to 4 times per year    Marital Status: Widowed     Outpatient Medications Prior to Visit  Medication Sig Dispense Refill   albuterol (VENTOLIN HFA) 108 (90 Base) MCG/ACT inhaler Inhale 2 puffs into the lungs every 6 (six) hours as needed for wheezing or shortness of breath. 18 g 2   apixaban (ELIQUIS) 2.5 MG TABS tablet TAKE 1 TABLET TWICE A DAY 180 tablet 1   cholecalciferol (VITAMIN D) 1000 units tablet Take 1,000 Units by mouth daily.     ezetimibe (ZETIA) 10 MG tablet TAKE 1 TABLET DAILY 90 tablet 3   folic acid (FOLVITE) 1 MG tablet TAKE 1 TABLET DAILY 90 tablet 3   furosemide (LASIX) 20 MG tablet Take one tablet as needed for shortness of breathe and leg edema 20 tablet 0   ipratropium (ATROVENT) 0.06 % nasal spray USE 1 SPRAY IN EACH NOSTRIL TWICE A DAY AS NEEDED FOR RHINITIS 45 mL 3   levothyroxine (SYNTHROID) 100 MCG tablet TAKE 1 TABLET EVERY MORNING ON AN EMPTY STOMACH 90 tablet 3   Melatonin 3 MG TABS Take 3 mg by mouth at bedtime.      Polyethyl Glycol-Propyl Glycol 0.4-0.3 % SOLN Place 1 drop into both eyes 2 (two) times daily.     rosuvastatin (CRESTOR) 5 MG tablet Take 0.5 tablets (2.5 mg total) by mouth daily. 90 tablet 3   SALINE NASAL SPRAY NA Place 1 spray into both nostrils in the morning and at bedtime.     vitamin B-12 (CYANOCOBALAMIN) 1000 MCG tablet Take 1,000 mcg by mouth daily.     No facility-administered medications prior to visit.     EXAM:  There were no vitals taken for this visit.  There is no height or weight on file to calculate BMI.  GENERAL: vitals reviewed and listed above, alert, oriented, appears well hydrated and in no acute distress HEENT: atraumatic, conjunctiva  clear, no obvious abnormalities on inspection of external nose and ears  NECK: no obvious masses on inspection palpation  LUNGS: clear to auscultation bilaterally, no wheezes, rales or rhonchi, good air movement CV: HRRR, no clubbing cyanosis or  peripheral edema nl cap refill  MS: moves all extremities without noticeable focal   abnormality PSYCH: pleasant and cooperative, no obvious depression or anxiety Lab Results  Component Value Date   WBC 8.0 08/12/2023   HGB 13.6 08/12/2023   HCT 41.7 08/12/2023  PLT 173.0 08/12/2023   GLUCOSE 107 (H) 08/12/2023   CHOL 143 11/04/2022   TRIG 83.0 11/04/2022   HDL 60.60 11/04/2022   LDLDIRECT 76.0 08/19/2019   LDLCALC 65 11/04/2022   ALT 14 12/28/2022   AST 16 12/28/2022   NA 138 08/12/2023   K 4.0 08/12/2023   CL 102 08/12/2023   CREATININE 0.79 08/12/2023   BUN 16 08/12/2023   CO2 30 08/12/2023   TSH 0.88 08/12/2023   INR 1.0 09/19/2022   HGBA1C 5.1 10/14/2016   BP Readings from Last 3 Encounters:  07/10/23 118/70  04/07/23 118/70  03/18/23 102/62    ASSESSMENT AND PLAN:  Discussed the following assessment and plan:  Osteoporosis, unspecified osteoporosis type, unspecified pathological fracture presence  Hypothyroidism, unspecified type  Medication management Calcium vit in range  -Patient advised to return or notify health care team  if  new concerns arise.  There are no Patient Instructions on file for this visit.   Neta Mends. Tashira Torre M.D.

## 2023-08-26 ENCOUNTER — Telehealth: Payer: Self-pay

## 2023-08-26 ENCOUNTER — Ambulatory Visit: Payer: Medicare Other | Admitting: Internal Medicine

## 2023-08-26 DIAGNOSIS — E039 Hypothyroidism, unspecified: Secondary | ICD-10-CM

## 2023-08-26 DIAGNOSIS — Z79899 Other long term (current) drug therapy: Secondary | ICD-10-CM

## 2023-08-26 DIAGNOSIS — M810 Age-related osteoporosis without current pathological fracture: Secondary | ICD-10-CM

## 2023-08-26 NOTE — Telephone Encounter (Signed)
Make sure no charge for  not coming  in

## 2023-08-26 NOTE — Telephone Encounter (Signed)
Spoke to pt. Pt reports she called his morning that she is not able to come in because of diarrhea. Inform pt there is no note but I can help reschedule her appt.   She is feeling better now.   Appt reschedule to sept 11.   Pt wants to provider to be aware.

## 2023-08-27 DIAGNOSIS — M1990 Unspecified osteoarthritis, unspecified site: Secondary | ICD-10-CM | POA: Diagnosis not present

## 2023-08-27 DIAGNOSIS — R278 Other lack of coordination: Secondary | ICD-10-CM | POA: Diagnosis not present

## 2023-08-27 DIAGNOSIS — G629 Polyneuropathy, unspecified: Secondary | ICD-10-CM | POA: Diagnosis not present

## 2023-08-27 DIAGNOSIS — Z9181 History of falling: Secondary | ICD-10-CM | POA: Diagnosis not present

## 2023-08-27 NOTE — Telephone Encounter (Signed)
Pt appt was moved and rescheduled to another day.

## 2023-08-29 ENCOUNTER — Other Ambulatory Visit: Payer: Self-pay | Admitting: Internal Medicine

## 2023-09-01 DIAGNOSIS — R278 Other lack of coordination: Secondary | ICD-10-CM | POA: Diagnosis not present

## 2023-09-01 DIAGNOSIS — M1990 Unspecified osteoarthritis, unspecified site: Secondary | ICD-10-CM | POA: Diagnosis not present

## 2023-09-01 DIAGNOSIS — G629 Polyneuropathy, unspecified: Secondary | ICD-10-CM | POA: Diagnosis not present

## 2023-09-01 DIAGNOSIS — Z9181 History of falling: Secondary | ICD-10-CM | POA: Diagnosis not present

## 2023-09-04 DIAGNOSIS — M1990 Unspecified osteoarthritis, unspecified site: Secondary | ICD-10-CM | POA: Diagnosis not present

## 2023-09-04 DIAGNOSIS — G629 Polyneuropathy, unspecified: Secondary | ICD-10-CM | POA: Diagnosis not present

## 2023-09-04 DIAGNOSIS — Z9181 History of falling: Secondary | ICD-10-CM | POA: Diagnosis not present

## 2023-09-04 DIAGNOSIS — R278 Other lack of coordination: Secondary | ICD-10-CM | POA: Diagnosis not present

## 2023-09-08 DIAGNOSIS — M1990 Unspecified osteoarthritis, unspecified site: Secondary | ICD-10-CM | POA: Diagnosis not present

## 2023-09-08 DIAGNOSIS — R278 Other lack of coordination: Secondary | ICD-10-CM | POA: Diagnosis not present

## 2023-09-08 DIAGNOSIS — Z9181 History of falling: Secondary | ICD-10-CM | POA: Diagnosis not present

## 2023-09-08 DIAGNOSIS — G629 Polyneuropathy, unspecified: Secondary | ICD-10-CM | POA: Diagnosis not present

## 2023-09-09 ENCOUNTER — Ambulatory Visit: Payer: Medicare Other | Admitting: Internal Medicine

## 2023-09-09 ENCOUNTER — Encounter: Payer: Self-pay | Admitting: Internal Medicine

## 2023-09-09 VITALS — BP 152/76 | HR 78 | Temp 97.7°F | Ht 61.0 in | Wt 102.4 lb

## 2023-09-09 DIAGNOSIS — Z23 Encounter for immunization: Secondary | ICD-10-CM

## 2023-09-09 DIAGNOSIS — M810 Age-related osteoporosis without current pathological fracture: Secondary | ICD-10-CM

## 2023-09-09 DIAGNOSIS — R296 Repeated falls: Secondary | ICD-10-CM

## 2023-09-09 MED ORDER — DENOSUMAB 60 MG/ML ~~LOC~~ SOSY
60.0000 mg | PREFILLED_SYRINGE | Freq: Once | SUBCUTANEOUS | Status: AC
Start: 2023-09-09 — End: 2023-09-09
  Administered 2023-09-09: 60 mg via SUBCUTANEOUS

## 2023-09-09 NOTE — Progress Notes (Signed)
Chief Complaint  Patient presents with   Medical Management of Chronic Issues    HPI: Amy Fischer 89 y.o. come in for Chronic disease management  ROS: See pertinent positives and negatives per HPI.  Past Medical History:  Diagnosis Date   Allergy    Anemia    Asymptomatic carotid artery stenosis    R ICA 40% stenosis on CTA 12/2011.   Bladder polyps    Cataract    BILATERAL-REMOVED   CHF (congestive heart failure) (HCC)    Diverticulosis    Elevated blood pressure 03/25/2011   Bp readings borderline today has hx of elvation  In office and ok at home   Not checke recently   She gfeels was elevated from anxiety     Episodic recurrent vertigo    MRI Head 2003   Esophageal spasm    GERD (gastroesophageal reflux disease)    Hepatic hemangioma    History of hepatitis    unknown type   Hyperlipidemia    Hyperplastic colon polyp    Hypothyroidism    Intestinal metaplasia of gastric mucosa    Osteoarthritis    Osteoporosis    Patellar fracture 07/13/2012   Scapular fracture 03/31/2012   small from direct blow with trip     Subclavian steal syndrome    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; CTA 2013 revealed open graft    Family History  Problem Relation Age of Onset   Hypertension Mother    Stroke Mother    Heart disease Father    Colon cancer Neg Hx    Neurofibromatosis Neg Hx    Allergic rhinitis Neg Hx    Asthma Neg Hx    Angioedema Neg Hx    Atopy Neg Hx    Eczema Neg Hx    Immunodeficiency Neg Hx    Urticaria Neg Hx     Social History   Socioeconomic History   Marital status: Widowed    Spouse name: Not on file   Number of children: 4   Years of education: Not on file   Highest education level: Bachelor's degree (e.g., BA, AB, BS)  Occupational History   Occupation: retired Runner, broadcasting/film/video  Tobacco Use   Smoking status: Former    Current packs/day: 0.00    Average packs/day: 0.8 packs/day for 20.0 years (15.0 ttl pk-yrs)    Types: Cigarettes     Start date: 01/05/1971    Quit date: 01/05/1991    Years since quitting: 32.6   Smokeless tobacco: Never  Vaping Use   Vaping status: Never Used  Substance and Sexual Activity   Alcohol use: Not Currently    Alcohol/week: 4.0 standard drinks of alcohol    Types: 4 Glasses of wine per week    Comment: socially   Drug use: No   Sexual activity: Not on file  Other Topics Concern   Not on file  Social History Narrative   Widowed.  Lives alone in a one story home.  Has 4 children (3 living).  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    HH of 1    No pets   Former smoker   Exercises regularly   Right handed   Social Determinants of Health   Financial Resource Strain: Low Risk  (09/16/2022)   Overall Financial Resource Strain (CARDIA)    Difficulty of Paying Living Expenses: Not hard at all  Food Insecurity: No Food Insecurity (04/29/2023)   Hunger Vital Sign    Worried About  Running Out of Food in the Last Year: Never true    Ran Out of Food in the Last Year: Never true  Transportation Needs: No Transportation Needs (12/29/2022)   PRAPARE - Administrator, Civil Service (Medical): No    Lack of Transportation (Non-Medical): No  Physical Activity: Sufficiently Active (09/16/2022)   Exercise Vital Sign    Days of Exercise per Week: 7 days    Minutes of Exercise per Session: 30 min  Stress: No Stress Concern Present (09/16/2022)   [employer] of Occupational Health - Occupational Stress Questionnaire    Feeling of Stress : Not at all  Social Connections: Moderately Integrated (09/03/2021)   Social Connection and Isolation Panel [NHANES]    Frequency of Communication with Friends and Family: More than three times a week    Frequency of Social Gatherings with Friends and Family: Three times a week    Attends Religious Services: More than 4 times per year    Active Member of Clubs or Organizations: Yes    Attends Banker Meetings: 1 to 4 times per year    Marital Status:  Widowed    Outpatient Medications Prior to Visit  Medication Sig Dispense Refill   apixaban (ELIQUIS) 2.5 MG TABS tablet TAKE 1 TABLET TWICE A DAY 180 tablet 1   cholecalciferol (VITAMIN D) 1000 units tablet Take 1,000 Units by mouth daily.     ezetimibe (ZETIA) 10 MG tablet TAKE 1 TABLET DAILY 90 tablet 3   folic acid (FOLVITE) 1 MG tablet TAKE 1 TABLET DAILY 90 tablet 3   furosemide (LASIX) 20 MG tablet Take one tablet as needed for shortness of breathe and leg edema 20 tablet 0   ipratropium (ATROVENT) 0.06 % nasal spray USE 1 SPRAY IN EACH NOSTRIL TWICE A DAY AS NEEDED FOR RHINITIS 45 mL 3   levothyroxine (SYNTHROID) 100 MCG tablet TAKE 1 TABLET EVERY MORNING ON AN EMPTY STOMACH 90 tablet 3   Melatonin 3 MG TABS Take 3 mg by mouth at bedtime.      Polyethyl Glycol-Propyl Glycol 0.4-0.3 % SOLN Place 1 drop into both eyes 2 (two) times daily.     rosuvastatin (CRESTOR) 5 MG tablet Take 0.5 tablets (2.5 mg total) by mouth daily. 90 tablet 3   SALINE NASAL SPRAY NA Place 1 spray into both nostrils in the morning and at bedtime.     albuterol (VENTOLIN HFA) 108 (90 Base) MCG/ACT inhaler Inhale 2 puffs into the lungs every 6 (six) hours as needed for wheezing or shortness of breath. (Patient not taking: Reported on 09/09/2023) 18 g 2   vitamin B-12 (CYANOCOBALAMIN) 1000 MCG tablet Take 1,000 mcg by mouth daily. (Patient not taking: Reported on 09/09/2023)     No facility-administered medications prior to visit.     EXAM:  BP (!) 156/80 (BP Location: Right Arm, Patient Position: Sitting, Cuff Size: Normal)   Pulse 78   Temp 97.7 F (36.5 C) (Oral)   Ht 5\' 1"  (1.549 m)   Wt 102 lb 6.4 oz (46.4 kg)   SpO2 98%   BMI 19.35 kg/m   Body mass index is 19.35 kg/m.  GENERAL: vitals reviewed and listed above, alert, oriented, appears well hydrated and in no acute distress HEENT: atraumatic, conjunctiva  clear, no obvious abnormalities on inspection of external nose and ears OP : no lesion  edema or exudate  NECK: no obvious masses on inspection palpation  LUNGS: clear to auscultation bilaterally, no wheezes,  rales or rhonchi, good air movement CV: HRRR, no clubbing cyanosis or  peripheral edema nl cap refill  MS: moves all extremities without noticeable focal  abnormality PSYCH: pleasant and cooperative, no obvious depression or anxiety Lab Results  Component Value Date   WBC 8.0 08/12/2023   HGB 13.6 08/12/2023   HCT 41.7 08/12/2023   PLT 173.0 08/12/2023   GLUCOSE 107 (H) 08/12/2023   CHOL 143 11/04/2022   TRIG 83.0 11/04/2022   HDL 60.60 11/04/2022   LDLDIRECT 76.0 08/19/2019   LDLCALC 65 11/04/2022   ALT 14 12/28/2022   AST 16 12/28/2022   NA 138 08/12/2023   K 4.0 08/12/2023   CL 102 08/12/2023   CREATININE 0.79 08/12/2023   BUN 16 08/12/2023   CO2 30 08/12/2023   TSH 0.88 08/12/2023   INR 1.0 09/19/2022   HGBA1C 5.1 10/14/2016   BP Readings from Last 3 Encounters:  09/09/23 (!) 156/80  07/10/23 118/70  04/07/23 118/70    ASSESSMENT AND PLAN:  Discussed the following assessment and plan:  Influenza vaccine needed  Osteoporosis, unspecified osteoporosis type, unspecified pathological fracture presence  -Patient advised to return or notify health care team  if  new concerns arise.  There are no Patient Instructions on file for this visit.   Neta Mends. Gavyn Zoss M.D.

## 2023-09-09 NOTE — Progress Notes (Signed)
Chief Complaint  Patient presents with   Medical Management of Chronic Issues    HPI: Amy Fischer 89 y.o. come in for Chronic disease management  Osteoporosis on prolia  due for injection  Prolia injection and fu osteoporosis  Taking exercise balance classes but still falls and catches herself once when cleaning out closet . No injury   No new sx otherwise   No cv sx new. ROS: See pertinent positives and negatives per HPI.  Past Medical History:  Diagnosis Date   Allergy    Anemia    Asymptomatic carotid artery stenosis    R ICA 40% stenosis on CTA 12/2011.   Bladder polyps    Cataract    BILATERAL-REMOVED   CHF (congestive heart failure) (HCC)    Diverticulosis    Elevated blood pressure 03/25/2011   Bp readings borderline today has hx of elvation  In office and ok at home   Not checke recently   She gfeels was elevated from anxiety     Episodic recurrent vertigo    MRI Head 2003   Esophageal spasm    GERD (gastroesophageal reflux disease)    Hepatic hemangioma    History of hepatitis    unknown type   Hyperlipidemia    Hyperplastic colon polyp    Hypothyroidism    Intestinal metaplasia of gastric mucosa    Osteoarthritis    Osteoporosis    Patellar fracture 07/13/2012   Scapular fracture 03/31/2012   small from direct blow with trip     Subclavian steal syndrome    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; CTA 2013 revealed open graft    Family History  Problem Relation Age of Onset   Hypertension Mother    Stroke Mother    Heart disease Father    Colon cancer Neg Hx    Neurofibromatosis Neg Hx    Allergic rhinitis Neg Hx    Asthma Neg Hx    Angioedema Neg Hx    Atopy Neg Hx    Eczema Neg Hx    Immunodeficiency Neg Hx    Urticaria Neg Hx     Social History   Socioeconomic History   Marital status: Widowed    Spouse name: Not on file   Number of children: 4   Years of education: Not on file   Highest education level: Bachelor's degree  (e.g., BA, AB, BS)  Occupational History   Occupation: retired Runner, broadcasting/film/video  Tobacco Use   Smoking status: Former    Current packs/day: 0.00    Average packs/day: 0.8 packs/day for 20.0 years (15.0 ttl pk-yrs)    Types: Cigarettes    Start date: 01/05/1971    Quit date: 01/05/1991    Years since quitting: 32.6   Smokeless tobacco: Never  Vaping Use   Vaping status: Never Used  Substance and Sexual Activity   Alcohol use: Not Currently    Alcohol/week: 4.0 standard drinks of alcohol    Types: 4 Glasses of wine per week    Comment: socially   Drug use: No   Sexual activity: Not on file  Other Topics Concern   Not on file  Social History Narrative   Widowed.  Lives alone in a one story home.  Has 4 children (3 living).  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    HH of 1    No pets   Former smoker   Exercises regularly   Right handed   Social Determinants of Health  Financial Resource Strain: Low Risk  (09/16/2022)   Overall Financial Resource Strain (CARDIA)    Difficulty of Paying Living Expenses: Not hard at all  Food Insecurity: No Food Insecurity (04/29/2023)   Hunger Vital Sign    Worried About Running Out of Food in the Last Year: Never true    Ran Out of Food in the Last Year: Never true  Transportation Needs: No Transportation Needs (12/29/2022)   PRAPARE - Administrator, Civil Service (Medical): No    Lack of Transportation (Non-Medical): No  Physical Activity: Sufficiently Active (09/16/2022)   Exercise Vital Sign    Days of Exercise per Week: 7 days    Minutes of Exercise per Session: 30 min  Stress: No Stress Concern Present (09/16/2022)   [employer] of Occupational Health - Occupational Stress Questionnaire    Feeling of Stress : Not at all  Social Connections: Moderately Integrated (09/03/2021)   Social Connection and Isolation Panel [NHANES]    Frequency of Communication with Friends and Family: More than three times a week    Frequency of Social Gatherings  with Friends and Family: Three times a week    Attends Religious Services: More than 4 times per year    Active Member of Clubs or Organizations: Yes    Attends Banker Meetings: 1 to 4 times per year    Marital Status: Widowed    Outpatient Medications Prior to Visit  Medication Sig Dispense Refill   apixaban (ELIQUIS) 2.5 MG TABS tablet TAKE 1 TABLET TWICE A DAY 180 tablet 1   cholecalciferol (VITAMIN D) 1000 units tablet Take 1,000 Units by mouth daily.     ezetimibe (ZETIA) 10 MG tablet TAKE 1 TABLET DAILY 90 tablet 3   folic acid (FOLVITE) 1 MG tablet TAKE 1 TABLET DAILY 90 tablet 3   furosemide (LASIX) 20 MG tablet Take one tablet as needed for shortness of breathe and leg edema 20 tablet 0   ipratropium (ATROVENT) 0.06 % nasal spray USE 1 SPRAY IN EACH NOSTRIL TWICE A DAY AS NEEDED FOR RHINITIS 45 mL 3   levothyroxine (SYNTHROID) 100 MCG tablet TAKE 1 TABLET EVERY MORNING ON AN EMPTY STOMACH 90 tablet 3   Melatonin 3 MG TABS Take 3 mg by mouth at bedtime.      Polyethyl Glycol-Propyl Glycol 0.4-0.3 % SOLN Place 1 drop into both eyes 2 (two) times daily.     rosuvastatin (CRESTOR) 5 MG tablet Take 0.5 tablets (2.5 mg total) by mouth daily. 90 tablet 3   SALINE NASAL SPRAY NA Place 1 spray into both nostrils in the morning and at bedtime.     albuterol (VENTOLIN HFA) 108 (90 Base) MCG/ACT inhaler Inhale 2 puffs into the lungs every 6 (six) hours as needed for wheezing or shortness of breath. (Patient not taking: Reported on 09/09/2023) 18 g 2   vitamin B-12 (CYANOCOBALAMIN) 1000 MCG tablet Take 1,000 mcg by mouth daily. (Patient not taking: Reported on 09/09/2023)     No facility-administered medications prior to visit.     EXAM:  BP (!) 152/76 (BP Location: Right Arm, Cuff Size: Normal)   Pulse 78   Temp 97.7 F (36.5 C) (Oral)   Ht 5\' 1"  (1.549 m)   Wt 102 lb 6.4 oz (46.4 kg)   SpO2 98%   BMI 19.35 kg/m   Body mass index is 19.35 kg/m. Wt Readings from Last  3 Encounters:  09/09/23 102 lb 6.4 oz (46.4 kg)  07/10/23 103 lb 3.2 oz (46.8 kg)  04/07/23 105 lb 9.6 oz (47.9 kg)   Temp Readings from Last 3 Encounters:  09/09/23 97.7 F (36.5 C) (Oral)  03/18/23 97.7 F (36.5 C) (Oral)  02/10/23 97.8 F (36.6 C) (Oral)   BP Readings from Last 3 Encounters:  09/09/23 (!) 152/76  07/10/23 118/70  04/07/23 118/70   Pulse Readings from Last 3 Encounters:  09/09/23 78  07/10/23 74  04/07/23 92    GENERAL: vitals reviewed and listed above, alert, oriented, appears well hydrated and in no acute distress walker for mobility steadiness but independent  HEENT: atraumatic, conjunctiva  clear, no obvious abnormalities on inspection of external nose and ears  NECK: no obvious masses on inspection palpation  LUNGS: clear to auscultation bilaterally, no wheezes, rales or rhonchi, good air movement CV: HRRR, no clubbing cyanosis or  peripheral edema nl cap refill  MS: moves all extremities without noticeable focal  abnormality arthritis change  itchy red sopt r thumb  PSYCH: pleasant and cooperative, no obvious depression or anxiety Lab Results  Component Value Date   WBC 8.0 08/12/2023   HGB 13.6 08/12/2023   HCT 41.7 08/12/2023   PLT 173.0 08/12/2023   GLUCOSE 107 (H) 08/12/2023   CHOL 143 11/04/2022   TRIG 83.0 11/04/2022   HDL 60.60 11/04/2022   LDLDIRECT 76.0 08/19/2019   LDLCALC 65 11/04/2022   ALT 14 12/28/2022   AST 16 12/28/2022   NA 138 08/12/2023   K 4.0 08/12/2023   CL 102 08/12/2023   CREATININE 0.79 08/12/2023   BUN 16 08/12/2023   CO2 30 08/12/2023   TSH 0.88 08/12/2023   INR 1.0 09/19/2022   HGBA1C 5.1 10/14/2016   BP Readings from Last 3 Encounters:  09/09/23 (!) 152/76  07/10/23 118/70  04/07/23 118/70  Lab reviewed   ASSESSMENT AND PLAN:  Discussed the following assessment and plan:  Osteoporosis, unspecified osteoporosis type, unspecified pathological fracture presence - Plan: denosumab (PROLIA) injection 60  mg  Influenza vaccine needed - Plan: Flu Vaccine Trivalent High Dose (Fluad)  Falls frequently - in pt exrcise program cathces her self usually realted to taskin  using walker Calcium vit in range  Bp up today get checked not in office  Had been   in reasonable range  -Patient advised to return or notify health care team  if  new concerns arise.  Patient Instructions  Good to see you today . Continue  exercises for fall prevention.  Labs were ok . Plan 6 mos  visit and prolia check    Amy Fischer M.D.

## 2023-09-09 NOTE — Patient Instructions (Signed)
Good to see you today . Continue  exercises for fall prevention.  Labs were ok . Plan 6 mos  visit and prolia check

## 2023-09-10 DIAGNOSIS — M1990 Unspecified osteoarthritis, unspecified site: Secondary | ICD-10-CM | POA: Diagnosis not present

## 2023-09-10 DIAGNOSIS — Z9181 History of falling: Secondary | ICD-10-CM | POA: Diagnosis not present

## 2023-09-10 DIAGNOSIS — G629 Polyneuropathy, unspecified: Secondary | ICD-10-CM | POA: Diagnosis not present

## 2023-09-10 DIAGNOSIS — R278 Other lack of coordination: Secondary | ICD-10-CM | POA: Diagnosis not present

## 2023-09-15 DIAGNOSIS — R278 Other lack of coordination: Secondary | ICD-10-CM | POA: Diagnosis not present

## 2023-09-15 DIAGNOSIS — M1990 Unspecified osteoarthritis, unspecified site: Secondary | ICD-10-CM | POA: Diagnosis not present

## 2023-09-15 DIAGNOSIS — Z9181 History of falling: Secondary | ICD-10-CM | POA: Diagnosis not present

## 2023-09-15 DIAGNOSIS — G629 Polyneuropathy, unspecified: Secondary | ICD-10-CM | POA: Diagnosis not present

## 2023-09-18 DIAGNOSIS — Z9181 History of falling: Secondary | ICD-10-CM | POA: Diagnosis not present

## 2023-09-18 DIAGNOSIS — M1990 Unspecified osteoarthritis, unspecified site: Secondary | ICD-10-CM | POA: Diagnosis not present

## 2023-09-18 DIAGNOSIS — R278 Other lack of coordination: Secondary | ICD-10-CM | POA: Diagnosis not present

## 2023-09-18 DIAGNOSIS — G629 Polyneuropathy, unspecified: Secondary | ICD-10-CM | POA: Diagnosis not present

## 2023-09-21 ENCOUNTER — Telehealth: Payer: Self-pay

## 2023-09-21 NOTE — Telephone Encounter (Signed)
Lov: 09/09/2023  Received a fax from [facility].   They are requesting Dr. Fabian Sharp to sign and date of PT progress note for pt.   Form placed in provider's red folder. Please advise.

## 2023-09-22 DIAGNOSIS — Z9181 History of falling: Secondary | ICD-10-CM | POA: Diagnosis not present

## 2023-09-22 DIAGNOSIS — M1990 Unspecified osteoarthritis, unspecified site: Secondary | ICD-10-CM | POA: Diagnosis not present

## 2023-09-22 DIAGNOSIS — G629 Polyneuropathy, unspecified: Secondary | ICD-10-CM | POA: Diagnosis not present

## 2023-09-22 DIAGNOSIS — R278 Other lack of coordination: Secondary | ICD-10-CM | POA: Diagnosis not present

## 2023-09-22 NOTE — Telephone Encounter (Signed)
Forms have been signed and dated

## 2023-09-23 NOTE — Telephone Encounter (Signed)
Forms faxed with a confirmation.

## 2023-09-24 DIAGNOSIS — R278 Other lack of coordination: Secondary | ICD-10-CM | POA: Diagnosis not present

## 2023-09-24 DIAGNOSIS — G629 Polyneuropathy, unspecified: Secondary | ICD-10-CM | POA: Diagnosis not present

## 2023-09-24 DIAGNOSIS — M1990 Unspecified osteoarthritis, unspecified site: Secondary | ICD-10-CM | POA: Diagnosis not present

## 2023-09-24 DIAGNOSIS — Z9181 History of falling: Secondary | ICD-10-CM | POA: Diagnosis not present

## 2023-09-29 DIAGNOSIS — G629 Polyneuropathy, unspecified: Secondary | ICD-10-CM | POA: Diagnosis not present

## 2023-09-29 DIAGNOSIS — M1990 Unspecified osteoarthritis, unspecified site: Secondary | ICD-10-CM | POA: Diagnosis not present

## 2023-09-29 DIAGNOSIS — Z9181 History of falling: Secondary | ICD-10-CM | POA: Diagnosis not present

## 2023-09-29 DIAGNOSIS — R278 Other lack of coordination: Secondary | ICD-10-CM | POA: Diagnosis not present

## 2023-09-30 DIAGNOSIS — G629 Polyneuropathy, unspecified: Secondary | ICD-10-CM | POA: Diagnosis not present

## 2023-09-30 DIAGNOSIS — R278 Other lack of coordination: Secondary | ICD-10-CM | POA: Diagnosis not present

## 2023-09-30 DIAGNOSIS — M1990 Unspecified osteoarthritis, unspecified site: Secondary | ICD-10-CM | POA: Diagnosis not present

## 2023-09-30 DIAGNOSIS — Z9181 History of falling: Secondary | ICD-10-CM | POA: Diagnosis not present

## 2023-10-06 DIAGNOSIS — Z9181 History of falling: Secondary | ICD-10-CM | POA: Diagnosis not present

## 2023-10-06 DIAGNOSIS — M1990 Unspecified osteoarthritis, unspecified site: Secondary | ICD-10-CM | POA: Diagnosis not present

## 2023-10-06 DIAGNOSIS — G629 Polyneuropathy, unspecified: Secondary | ICD-10-CM | POA: Diagnosis not present

## 2023-10-06 DIAGNOSIS — R278 Other lack of coordination: Secondary | ICD-10-CM | POA: Diagnosis not present

## 2023-10-08 DIAGNOSIS — Z9181 History of falling: Secondary | ICD-10-CM | POA: Diagnosis not present

## 2023-10-08 DIAGNOSIS — G629 Polyneuropathy, unspecified: Secondary | ICD-10-CM | POA: Diagnosis not present

## 2023-10-08 DIAGNOSIS — R278 Other lack of coordination: Secondary | ICD-10-CM | POA: Diagnosis not present

## 2023-10-08 DIAGNOSIS — M1990 Unspecified osteoarthritis, unspecified site: Secondary | ICD-10-CM | POA: Diagnosis not present

## 2023-10-13 DIAGNOSIS — Z9181 History of falling: Secondary | ICD-10-CM | POA: Diagnosis not present

## 2023-10-13 DIAGNOSIS — G629 Polyneuropathy, unspecified: Secondary | ICD-10-CM | POA: Diagnosis not present

## 2023-10-13 DIAGNOSIS — R278 Other lack of coordination: Secondary | ICD-10-CM | POA: Diagnosis not present

## 2023-10-13 DIAGNOSIS — M1990 Unspecified osteoarthritis, unspecified site: Secondary | ICD-10-CM | POA: Diagnosis not present

## 2023-10-14 ENCOUNTER — Other Ambulatory Visit: Payer: Self-pay | Admitting: Internal Medicine

## 2023-10-16 DIAGNOSIS — M1990 Unspecified osteoarthritis, unspecified site: Secondary | ICD-10-CM | POA: Diagnosis not present

## 2023-10-16 DIAGNOSIS — G629 Polyneuropathy, unspecified: Secondary | ICD-10-CM | POA: Diagnosis not present

## 2023-10-16 DIAGNOSIS — R278 Other lack of coordination: Secondary | ICD-10-CM | POA: Diagnosis not present

## 2023-10-16 DIAGNOSIS — Z9181 History of falling: Secondary | ICD-10-CM | POA: Diagnosis not present

## 2023-10-19 ENCOUNTER — Ambulatory Visit: Payer: Medicare Other | Attending: Physician Assistant | Admitting: Physician Assistant

## 2023-10-19 ENCOUNTER — Encounter: Payer: Self-pay | Admitting: Physician Assistant

## 2023-10-19 VITALS — BP 126/76 | HR 65 | Ht 61.0 in | Wt 102.0 lb

## 2023-10-19 DIAGNOSIS — I2510 Atherosclerotic heart disease of native coronary artery without angina pectoris: Secondary | ICD-10-CM | POA: Insufficient documentation

## 2023-10-19 DIAGNOSIS — E785 Hyperlipidemia, unspecified: Secondary | ICD-10-CM | POA: Insufficient documentation

## 2023-10-19 DIAGNOSIS — Z9889 Other specified postprocedural states: Secondary | ICD-10-CM | POA: Insufficient documentation

## 2023-10-19 DIAGNOSIS — I10 Essential (primary) hypertension: Secondary | ICD-10-CM | POA: Diagnosis not present

## 2023-10-19 DIAGNOSIS — I480 Paroxysmal atrial fibrillation: Secondary | ICD-10-CM | POA: Insufficient documentation

## 2023-10-19 NOTE — Patient Instructions (Signed)
Medication Instructions:  NO CHANGES *If you need a refill on your cardiac medications before your next appointment, please call your pharmacy*   Lab Work: NO LABS If you have labs (blood work) drawn today and your tests are completely normal, you will receive your results only by: MyChart Message (if you have MyChart) OR A paper copy in the mail If you have any lab test that is abnormal or we need to change your treatment, we will call you to review the results.   Testing/Procedures: NO TESTING   Follow-Up: At [facility], you and your health needs are our priority.  As part of our continuing mission to provide you with exceptional heart care, we have created designated Provider Care Teams.  These Care Teams include your primary Cardiologist (physician) and Advanced Practice Providers (APPs -  Physician Assistants and Nurse Practitioners) who all work together to provide you with the care you need, when you need it.   Your next appointment:   6 month(s)  Provider:   Olga Millers, MD

## 2023-10-19 NOTE — Progress Notes (Signed)
Cardiology Office Note:  .   Date:  10/19/2023  ID:  Amy Fischer, DOB 19-Apr-1929, MRN [id number] PCP: Madelin Headings, MD  [facility] Providers Cardiologist:  Olga Millers, MD     History of Present Illness: .   Amy Fischer is a 89 y.o. female with past medical history of nonobstructive CAD, severe MR s/p repair, PAF, hypertension and hyperlipidemia.  He has been followed by Dr. Jens Som.  Patient was admitted in October 2016 with congestive heart failure.  Echocardiogram at that time showed normal EF, flail posterior leaflet with severe MR, mild to moderate TR.  Subsequent cardiac catheterization revealed 50% LAD, 60% D1, 30%RCA lesion, EF 60%.  She underwent mitral valve repair.  Preoperative carotid Doppler showed 1 to 39% disease.  She had atrial fibrillation after the surgery.  Follow-up heart monitor in March 2019 demonstrated sinus rhythm and paroxysmal A-fib/atrial flutter.  She was placed on Eliquis and amiodarone. She was previously admitted for acute heart failure near the end of 2023 and the beginning of 2024.  She was discharged on 20 mg PRN Lasix. Echocardiogram obtained in January 2024 showed normal EF, mild LVH, mild RV dysfunction, mild right atrial enlargement, stable mitral valve repair with mild MR, mild to moderate TR. Patient was last seen by Dr. Jens Som again in April 2024 at which time she was doing well.   I saw the patient in July 2024 after an episode of volume overload, she has taken a single dose of oral Lasix 20 mg and has good urinary output.  By the time I saw her, she only has mild bilateral pedal edema however her breathing has significantly improved.  Given her advanced age, I did not recommend any additional workup.  Although heart rate was irregular at the time, EKG confirmed she was in sinus rhythm.  She presents today for 89-month follow-up.  She has no significant lower extremity edema.  Based on her home weight diary, weight has been between 103  to 105 pounds.  Her lung is clear.  She denies any chest pain or shortness of breath.  In the past month, she only used Lasix once.  Overall, she is doing well from the cardiac perspective.  She can follow-up with Dr. Jens Som in 6 months.  She is due for fasting lipid panel, she is not fasting today, previous fasting lipid panel has been obtained by PCP, will defer repeat fasting lipid panel this year to PCP as well.  ROS:   She denies chest pain, palpitations, dyspnea, pnd, orthopnea, n, v, dizziness, syncope, edema, weight gain, or early satiety. All other systems reviewed and are otherwise negative except as noted above.    Studies Reviewed: .        Cardiac Studies & Procedures   CARDIAC CATHETERIZATION  CARDIAC CATHETERIZATION 10/04/2015  Narrative 1. Prox LAD to Mid LAD lesion, 30% stenosed. 2. Ost 1st Diag to 1st Diag lesion, 60% stenosed. 3. Prox RCA to Mid RCA lesion, 30% stenosed.   Moderate to severe mitral regurgitation by ventriculography.  Preserved left ventricular systolic function with ejection fraction of 60%. No regional wall motion abnormality is noted.  Widely patent coronary arteries with minimal luminal irregularities noted in the LAD, diagonal, and mid right coronary.  Mild pulmonary hypertension. Significant V wave spiking up to 35 mmHg.   Recommendations:   Surgical therapy per TCTS, Dr. Dorris Fetch.  Sheath removal by manual compression of the right femoral area.  Monitor for evidence of hematoma.  Findings Coronary Findings Diagnostic  Dominance: Right  Left Anterior Descending calcified eccentric.  First Diagonal Branch calcified eccentric.  Right Coronary Artery eccentric.  Intervention  No interventions have been documented.     ECHOCARDIOGRAM  ECHOCARDIOGRAM COMPLETE 12/29/2022  Narrative ECHOCARDIOGRAM REPORT    Patient Name:   Amy Fischer Date of Exam: 12/29/2022 Medical Rec #:  [id number]       Height:       61.0  in Accession #:    [id number]      Weight:       105.0 lb Date of Birth:  Feb 18, 1929        BSA:          1.436 m Patient Age:    89 years        BP:           131/84 mmHg Patient Gender: F               HR:           79 bpm. Exam Location:  Inpatient  Procedure: 2D Echo, Color Doppler and Cardiac Doppler  Indications:    CHF - Acute Diastolic  History:        Patient has prior history of Echocardiogram examinations, most recent 04/20/2018. CHF, Arrythmias:Atrial Fibrillation, Signs/Symptoms:Shortness of Breath; Risk Factors:Hypertension and Dyslipidemia.  Mitral Valve: 28 mm Carpentier-Edwards Physio II prosthetic annuloplasty ring valve is present in the mitral position. Procedure Date: 10/10/2015.  Sonographer:    Milda Smart Referring Phys: [id number] RONDELL A SMITH   Sonographer Comments: Technically difficult study due to poor echo windows. Image acquisition challenging due to patient body habitus and Image acquisition challenging due to respiratory motion. IMPRESSIONS   1. Left ventricular ejection fraction, by estimation, is 65 to 70%. The left ventricle has normal function. The left ventricle has no regional wall motion abnormalities. There is mild left ventricular hypertrophy. Left ventricular diastolic parameters are indeterminate. 2. Right ventricular systolic function is mildly reduced. The right ventricular size is normal. There is mildly elevated pulmonary artery systolic pressure. The estimated right ventricular systolic pressure is 35.0 mmHg. 3. Right atrial size was mildly dilated. 4. The mitral valve has been repaired. Mild mitral valve regurgitation. Mild mitral stenosis. The mean mitral valve gradient is 5.0 mmHg with average heart rate of 77 bpm. There is a 28 mm Carpentier-Edwards Physio II prosthetic annuloplasty ring present in the mitral position. Procedure Date: 10/10/2015. 5. Tricuspid valve regurgitation is mild to moderate. 6. The aortic valve is  tricuspid. Aortic valve regurgitation is not visualized. No aortic stenosis is present. 7. The inferior vena cava is normal in size with greater than 50% respiratory variability, suggesting right atrial pressure of 3 mmHg.  FINDINGS Left Ventricle: Left ventricular ejection fraction, by estimation, is 65 to 70%. The left ventricle has normal function. The left ventricle has no regional wall motion abnormalities. The left ventricular internal cavity size was small. There is mild left ventricular hypertrophy. Left ventricular diastolic parameters are indeterminate.  Right Ventricle: The right ventricular size is normal. Right vetricular wall thickness was not well visualized. Right ventricular systolic function is mildly reduced. There is mildly elevated pulmonary artery systolic pressure. The tricuspid regurgitant velocity is 2.83 m/s, and with an assumed right atrial pressure of 3 mmHg, the estimated right ventricular systolic pressure is 35.0 mmHg.  Left Atrium: Left atrial size was normal in size.  Right Atrium: Right atrial size was mildly dilated.  Pericardium: There is no  evidence of pericardial effusion.  Mitral Valve: The mitral valve has been repaired/replaced. Mild mitral valve regurgitation. There is a 28 mm Carpentier-Edwards Physio II prosthetic annuloplasty ring present in the mitral position. Procedure Date: 10/10/2015. Mild mitral valve stenosis. MV peak gradient, 9.9 mmHg. The mean mitral valve gradient is 5.0 mmHg with average heart rate of 77 bpm.  Tricuspid Valve: The tricuspid valve is normal in structure. Tricuspid valve regurgitation is mild to moderate.  Aortic Valve: The aortic valve is tricuspid. Aortic valve regurgitation is not visualized. No aortic stenosis is present.  Pulmonic Valve: The pulmonic valve was not well visualized. Pulmonic valve regurgitation is not visualized.  Aorta: The aortic root and ascending aorta are structurally normal, with no evidence of  dilitation.  Venous: The inferior vena cava is normal in size with greater than 50% respiratory variability, suggesting right atrial pressure of 3 mmHg.  IAS/Shunts: The interatrial septum was not well visualized.   LEFT VENTRICLE PLAX 2D LVIDd:         3.50 cm   Diastology LVIDs:         2.30 cm   LV e' medial:  4.13 cm/s LV PW:         0.90 cm   LV e' lateral: 6.64 cm/s LV IVS:        1.20 cm LVOT diam:     2.00 cm LV SV:         52 LV SV Index:   36 LVOT Area:     3.14 cm   RIGHT VENTRICLE            IVC RV S prime:     9.03 cm/s  IVC diam: 1.30 cm TAPSE (M-mode): 1.7 cm  LEFT ATRIUM           Index        RIGHT ATRIUM           Index LA diam:      4.20 cm 2.92 cm/m   RA Area:     15.60 cm LA Vol (A4C): 43.4 ml 30.22 ml/m  RA Volume:   43.50 ml  30.29 ml/m AORTIC VALVE LVOT Vmax:   74.30 cm/s LVOT Vmean:  50.400 cm/s LVOT VTI:    0.164 m  AORTA Ao Root diam: 2.80 cm Ao Asc diam:  2.60 cm  MITRAL VALVE              TRICUSPID VALVE MV Area VTI:  0.86 cm    TR Peak grad:   32.0 mmHg MV Peak grad: 9.9 mmHg    TR Mean grad:   23.0 mmHg MV Mean grad: 5.0 mmHg    TR Vmax:        283.00 cm/s MV Vmax:      1.57 m/s    TR Vmean:       231.0 cm/s MV Vmean:     89.6 cm/s MR Peak grad: 57.9 mmHg   SHUNTS MR Vmax:      380.50 cm/s Systemic VTI:  0.16 m Systemic Diam: 2.00 cm  Epifanio Lesches MD Electronically signed by Epifanio Lesches MD Signature Date/Time: 12/29/2022/1:05:38 PM    Final   TEE  ECHO TEE 10/10/2015  Narrative *[place]* *[facility]* [address] [place], [place] [address] [phone]  ------------------------------------------------------------------- Intraoperative Transesophageal Echocardiography  Patient:    Amy Fischer, Amy Fischer MR #:       [id number] Study Date: 10/10/2015 Gender:     F Age:  86 Height: Weight: BSA: Pt. Status: Room:       4I34V  ATTENDING    Charlett Lango, MD ORDERING      Charlett Lango, MD REFERRING    Charlett Lango, MD SONOGRAPHER  Sherren Kerns, RVS PERFORMING   Judie Petit, MD SONOGRAPHER  Arvil Chaco ADMITTING    Joen Laura P  cc:  ------------------------------------------------------------------- LV EF: 50% -   55%  ------------------------------------------------------------------- Indications:     Mitral valve repair/replacement  ------------------------------------------------------------------- Study Conclusions  - Left ventricle: Systolic function was normal. The estimated ejection fraction was in the range of 50% to 55%. - Aortic valve: No evidence of vegetation. - Mitral valve: Flail motion. There was moderate to severe regurgitation. - Right atrium: No evidence of thrombus in the appendage. - Atrial septum: No defect or patent foramen ovale was identified. Echo contrast study showed no right-to-left atrial level shunt, following an increase in RA pressure induced by provocative maneuvers.  Intraoperative transesophageal echocardiography.  Birthdate: Patient birthdate: May 19, 1929.  Age:  Patient is 89 yr old.  Sex: Gender: female.  Study date:  Study date: 10/10/2015. Study time: 07:56 AM.  -------------------------------------------------------------------  ------------------------------------------------------------------- Left ventricle:  Systolic function was normal. The estimated ejection fraction was in the range of 50% to 55%.  ------------------------------------------------------------------- Aortic valve:   Structurally normal valve.   Cusp separation was normal.  No evidence of vegetation.  Doppler:  There was no regurgitation.  ------------------------------------------------------------------- Mitral valve:  Flail motion.  Doppler:  There was moderate to severe regurgitation.  ------------------------------------------------------------------- Atrial septum:  No defect or patent  foramen ovale was identified. Echo contrast study showed no right-to-left atrial level shunt, following an increase in RA pressure induced by provocative maneuvers.  ------------------------------------------------------------------- Tricuspid valve:   Doppler:  There was mild regurgitation.  ------------------------------------------------------------------- Right atrium:   No evidence of thrombus in the appendage. The appendage was normal sized.  ------------------------------------------------------------------- Post bypass:  ------------------------------------------------------------------- Prepared and Electronically Authenticated by  Judie Petit, MD 2016-10-21T01:29:30   MONITORS  LONG TERM MONITOR (3-14 DAYS) 02/03/2020  Narrative Sinus with occasional PAC, PVC and brief PAT .Olga Millers           Risk Assessment/Calculations:    CHA2DS2-VASc Score = 5   This indicates a 7.2% annual risk of stroke. The patient's score is based upon: CHF History: 0 HTN History: 1 Diabetes History: 0 Stroke History: 0 Vascular Disease History: 1 Age Score: 2 Gender Score: 1            Physical Exam:   VS:  BP 126/76   Pulse 65   Ht 5\' 1"  (1.549 m)   Wt 102 lb (46.3 kg)   SpO2 95%   BMI 19.27 kg/m    Wt Readings from Last 3 Encounters:  10/19/23 102 lb (46.3 kg)  09/09/23 102 lb 6.4 oz (46.4 kg)  07/10/23 103 lb 3.2 oz (46.8 kg)    GEN: Well nourished, well developed in no acute distress NECK: No JVD; No carotid bruits CARDIAC: RRR, no murmurs, rubs, gallops RESPIRATORY:  Clear to auscultation without rales, wheezing or rhonchi  ABDOMEN: Soft, non-tender, non-distended EXTREMITIES:  No edema; No deformity   ASSESSMENT AND PLAN: .    CAD: Denies any recent chest pain  History of mitral valve repair: Stable on last echocardiogram in January 2024.  Given her advanced age, no plan for repeat echocardiogram.  PAF: On Eliquis.  Maintaining sinus rhythm  based on last EKG  Hypertension: Blood pressure well-controlled  Hyperlipidemia: On  Zetia and Crestor.  Will defer fasting lipid panel to PCP       Dispo: Follow-up in 6 months  Signed, Azalee Course, [place]

## 2023-10-20 DIAGNOSIS — R278 Other lack of coordination: Secondary | ICD-10-CM | POA: Diagnosis not present

## 2023-10-20 DIAGNOSIS — G629 Polyneuropathy, unspecified: Secondary | ICD-10-CM | POA: Diagnosis not present

## 2023-10-20 DIAGNOSIS — M1990 Unspecified osteoarthritis, unspecified site: Secondary | ICD-10-CM | POA: Diagnosis not present

## 2023-10-20 DIAGNOSIS — Z9181 History of falling: Secondary | ICD-10-CM | POA: Diagnosis not present

## 2023-10-22 DIAGNOSIS — M1990 Unspecified osteoarthritis, unspecified site: Secondary | ICD-10-CM | POA: Diagnosis not present

## 2023-10-22 DIAGNOSIS — Z9181 History of falling: Secondary | ICD-10-CM | POA: Diagnosis not present

## 2023-10-22 DIAGNOSIS — G629 Polyneuropathy, unspecified: Secondary | ICD-10-CM | POA: Diagnosis not present

## 2023-10-22 DIAGNOSIS — R278 Other lack of coordination: Secondary | ICD-10-CM | POA: Diagnosis not present

## 2023-10-27 DIAGNOSIS — R278 Other lack of coordination: Secondary | ICD-10-CM | POA: Diagnosis not present

## 2023-10-27 DIAGNOSIS — Z9181 History of falling: Secondary | ICD-10-CM | POA: Diagnosis not present

## 2023-10-27 DIAGNOSIS — M1990 Unspecified osteoarthritis, unspecified site: Secondary | ICD-10-CM | POA: Diagnosis not present

## 2023-10-27 DIAGNOSIS — G629 Polyneuropathy, unspecified: Secondary | ICD-10-CM | POA: Diagnosis not present

## 2023-11-13 ENCOUNTER — Emergency Department (HOSPITAL_COMMUNITY)
Admission: EM | Admit: 2023-11-13 | Discharge: 2023-11-13 | Disposition: A | Discharge status: Home / Self Care | Payer: Medicare Other | Attending: Emergency Medicine | Admitting: Emergency Medicine

## 2023-11-13 ENCOUNTER — Emergency Department (HOSPITAL_COMMUNITY): Payer: Medicare Other

## 2023-11-13 DIAGNOSIS — Z7901 Long term (current) use of anticoagulants: Secondary | ICD-10-CM | POA: Diagnosis not present

## 2023-11-13 DIAGNOSIS — M4802 Spinal stenosis, cervical region: Secondary | ICD-10-CM | POA: Diagnosis not present

## 2023-11-13 DIAGNOSIS — M11261 Other chondrocalcinosis, right knee: Secondary | ICD-10-CM | POA: Diagnosis not present

## 2023-11-13 DIAGNOSIS — I700 Atherosclerosis of aorta: Secondary | ICD-10-CM | POA: Diagnosis not present

## 2023-11-13 DIAGNOSIS — W19XXXA Unspecified fall, initial encounter: Secondary | ICD-10-CM

## 2023-11-13 DIAGNOSIS — M160 Bilateral primary osteoarthritis of hip: Secondary | ICD-10-CM | POA: Diagnosis not present

## 2023-11-13 DIAGNOSIS — S6991XA Unspecified injury of right wrist, hand and finger(s), initial encounter: Secondary | ICD-10-CM | POA: Insufficient documentation

## 2023-11-13 DIAGNOSIS — M25562 Pain in left knee: Secondary | ICD-10-CM | POA: Diagnosis not present

## 2023-11-13 DIAGNOSIS — M25462 Effusion, left knee: Secondary | ICD-10-CM | POA: Diagnosis not present

## 2023-11-13 DIAGNOSIS — S2232XA Fracture of one rib, left side, initial encounter for closed fracture: Secondary | ICD-10-CM | POA: Diagnosis not present

## 2023-11-13 DIAGNOSIS — M7989 Other specified soft tissue disorders: Secondary | ICD-10-CM | POA: Diagnosis not present

## 2023-11-13 DIAGNOSIS — M19042 Primary osteoarthritis, left hand: Secondary | ICD-10-CM | POA: Diagnosis not present

## 2023-11-13 DIAGNOSIS — W01198A Fall on same level from slipping, tripping and stumbling with subsequent striking against other object, initial encounter: Secondary | ICD-10-CM | POA: Insufficient documentation

## 2023-11-13 DIAGNOSIS — M47812 Spondylosis without myelopathy or radiculopathy, cervical region: Secondary | ICD-10-CM | POA: Diagnosis not present

## 2023-11-13 DIAGNOSIS — M11262 Other chondrocalcinosis, left knee: Secondary | ICD-10-CM | POA: Diagnosis not present

## 2023-11-13 DIAGNOSIS — S199XXA Unspecified injury of neck, initial encounter: Secondary | ICD-10-CM | POA: Diagnosis not present

## 2023-11-13 DIAGNOSIS — R519 Headache, unspecified: Secondary | ICD-10-CM | POA: Insufficient documentation

## 2023-11-13 DIAGNOSIS — M19041 Primary osteoarthritis, right hand: Secondary | ICD-10-CM | POA: Diagnosis not present

## 2023-11-13 DIAGNOSIS — R9431 Abnormal electrocardiogram [ECG] [EKG]: Secondary | ICD-10-CM | POA: Diagnosis not present

## 2023-11-13 DIAGNOSIS — S63111A Subluxation of metacarpophalangeal joint of right thumb, initial encounter: Secondary | ICD-10-CM | POA: Diagnosis not present

## 2023-11-13 DIAGNOSIS — S80919A Unspecified superficial injury of unspecified knee, initial encounter: Secondary | ICD-10-CM | POA: Diagnosis not present

## 2023-11-13 DIAGNOSIS — S0990XA Unspecified injury of head, initial encounter: Secondary | ICD-10-CM | POA: Diagnosis not present

## 2023-11-13 DIAGNOSIS — R609 Edema, unspecified: Secondary | ICD-10-CM | POA: Diagnosis not present

## 2023-11-13 DIAGNOSIS — Z043 Encounter for examination and observation following other accident: Secondary | ICD-10-CM | POA: Diagnosis not present

## 2023-11-13 DIAGNOSIS — M1711 Unilateral primary osteoarthritis, right knee: Secondary | ICD-10-CM | POA: Diagnosis not present

## 2023-11-13 DIAGNOSIS — I10 Essential (primary) hypertension: Secondary | ICD-10-CM | POA: Diagnosis not present

## 2023-11-13 DIAGNOSIS — M1712 Unilateral primary osteoarthritis, left knee: Secondary | ICD-10-CM | POA: Diagnosis not present

## 2023-11-13 LAB — COMPREHENSIVE METABOLIC PANEL
ALT: 14 U/L (ref 0–44)
AST: 19 U/L (ref 15–41)
Albumin: 3.8 g/dL (ref 3.5–5.0)
Alkaline Phosphatase: 75 U/L (ref 38–126)
Anion gap: 9 (ref 5–15)
BUN: 10 mg/dL (ref 8–23)
CO2: 23 mmol/L (ref 22–32)
Calcium: 9.5 mg/dL (ref 8.9–10.3)
Chloride: 107 mmol/L (ref 98–111)
Creatinine, Ser: 0.65 mg/dL (ref 0.44–1.00)
GFR, Estimated: >60 mL/min (ref >60)
Glucose, Bld: 111 mg/dL — ABNORMAL HIGH (ref 70–99)
Potassium: 3.9 mmol/L (ref 3.5–5.1)
Sodium: 139 mmol/L (ref 135–145)
Total Bilirubin: 0.9 mg/dL (ref <1.2)
Total Protein: 7.2 g/dL (ref 6.5–8.1)

## 2023-11-13 LAB — CBC
HCT: 45.4 % (ref 36.0–46.0)
Hemoglobin: 15.3 g/dL — ABNORMAL HIGH (ref 12.0–15.0)
MCH: 32.6 pg (ref 26.0–34.0)
MCHC: 33.7 g/dL (ref 30.0–36.0)
MCV: 96.8 fL (ref 80.0–100.0)
Platelets: 218 10*3/uL (ref 150–400)
RBC: 4.69 MIL/uL (ref 3.87–5.11)
RDW: 13.3 % (ref 11.5–15.5)
WBC: 7.4 10*3/uL (ref 4.0–10.5)
nRBC: 0 % (ref 0.0–0.2)

## 2023-11-13 NOTE — ED Notes (Signed)
X-ray at bedside

## 2023-11-13 NOTE — ED Notes (Signed)
PT taken to CT with TRN Marchelle Folks T

## 2023-11-13 NOTE — ED Notes (Signed)
Initial xray at bedside

## 2023-11-13 NOTE — Discharge Instructions (Signed)
You were evaluated here in the emergency department today for a fall You had imaging that concluded a CT of your head and your neck that did not show any evidence of bleeding or injury to your neck bones. Your thumb does not show obvious fractures but could have a break there is severe degenerative disease.  You may use the splint for comfort as needed.  Please keep your hand elevated and use cold therapy intermittently.  Follow-up as per orthopedics. Return if you are having any new or worsening symptoms especially increased headache or change in mental status.  Somebody should stay with you for the next 48 hours.

## 2023-11-13 NOTE — Progress Notes (Signed)
   11/13/23 0810  Spiritual Encounters  Type of Visit Initial  Care provided to: Patient  Conversation partners present during encounter Nurse  Referral source Code page  Reason for visit Trauma  OnCall Visit Yes   Responded to fall on thinner family on the way, daughter Amy Fischer is expected - referred to oncoming chaplain to follow up after CT.

## 2023-11-13 NOTE — Progress Notes (Signed)
Orthopedic Tech Progress Note Patient Details:  Amy Fischer 05/20/1929 [id number] Level 2 Trauma. Not needed Patient ID: Amy Fischer, female   DOB: 1929-08-10, 89 y.o.   MRN: [id number]  Amy Fischer 11/13/2023, 8:46 AM

## 2023-11-13 NOTE — ED Notes (Signed)
Pt was assisted to dress & to bathroom by daughter. IV removed & was in w/c waiting for thumb spica. This RN called Ortho about its location & was finally able to locate it & upon return to the pt she had left without it.
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
## 2023-11-13 NOTE — Consult Note (Signed)
Reason for Consult:Right thumb pain Referring Physician: Margarita Fischer Time called: 1018 Time at bedside: 1025   Amy Fischer is an 88 y.o. female.  HPI: Amy Fischer fell earlier today. She had bilateral hand and knee pain. Her right thumb was quite bruised and swollen and hand surgery was consulted. She is RHD.  Past Medical History:  Diagnosis Date   Allergy    Anemia    Asymptomatic carotid artery stenosis    R ICA 40% stenosis on CTA 12/2011.   Bladder polyps    Cataract    BILATERAL-REMOVED   CHF (congestive heart failure) (HCC)    Diverticulosis    Elevated blood pressure 03/25/2011   Bp readings borderline today has hx of elvation  In office and ok at home   Not checke recently   She gfeels was elevated from anxiety     Episodic recurrent vertigo    MRI Head 2003   Esophageal spasm    GERD (gastroesophageal reflux disease)    Hepatic hemangioma    History of hepatitis    unknown type   Hyperlipidemia    Hyperplastic colon polyp    Hypothyroidism    Intestinal metaplasia of gastric mucosa    Osteoarthritis    Osteoporosis    Patellar fracture 07/13/2012   Scapular fracture 03/31/2012   small from direct blow with trip     Subclavian steal syndrome    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; CTA 2013 revealed open graft    Past Surgical History:  Procedure Laterality Date   APPENDECTOMY     CARDIAC CATHETERIZATION N/A 10/04/2015   Procedure: Right/Left Heart Cath and Coronary Angiography;  Surgeon: Lyn Records, MD;  Location: [facility] INVASIVE CV LAB;  Service: Cardiovascular;  Laterality: N/A;   CAROTID-SUBCLAVIAN BYPASS GRAFT Left 1999   for [place] steal syndrome   CHOLECYSTECTOMY     COLONOSCOPY     CYSTECTOMY Left    hand   ELBOW SURGERY Left    KNEE SURGERY Left 2014   MITRAL VALVE REPAIR N/A 10/10/2015   Procedure: MITRAL VALVE REPAIR (MVR) WITH SIZE 28 CARPENTIER-EDWARDS PHYSIO II ANNULOPLASTY RING;  Surgeon: Loreli Slot, MD;  Location: [facility] OR;  Service:  Open Heart Surgery;  Laterality: N/A;   ROTATOR CUFF REPAIR Left    TEAR DUCT PROBING  07/2014   TEE WITHOUT CARDIOVERSION N/A 10/03/2015   Procedure: TRANSESOPHAGEAL ECHOCARDIOGRAM (TEE);  Surgeon: Laurey Morale, MD;  Location: [facility] ENDOSCOPY;  Service: Cardiovascular;  Laterality: N/A;   TEE WITHOUT CARDIOVERSION N/A 10/10/2015   Procedure: TRANSESOPHAGEAL ECHOCARDIOGRAM (TEE);  Surgeon: Loreli Slot, MD;  Location: [facility] OR;  Service: Open Heart Surgery;  Laterality: N/A;   TONSILLECTOMY     TUBAL LIGATION      Family History  Problem Relation Age of Onset   Hypertension Mother    Stroke Mother    Heart disease Father    Colon cancer Neg Hx    Neurofibromatosis Neg Hx    Allergic rhinitis Neg Hx    Asthma Neg Hx    Angioedema Neg Hx    Atopy Neg Hx    Eczema Neg Hx    Immunodeficiency Neg Hx    Urticaria Neg Hx     Social History:  reports that she quit smoking about 32 years ago. Her smoking use included cigarettes. She started smoking about 52 years ago. She has a 15 pack-year smoking history. She has never used smokeless tobacco. She reports that  she does not currently use alcohol after a past usage of about 4.0 standard drinks of alcohol per week. She reports that she does not use drugs.  Allergies:  Allergies  Allergen Reactions   Codeine Nausea And Vomiting   Risedronate Sodium     Upset stomach. Could take Fosamax.   Statins     Muscles hurt, can take low dose    Tape Other (See Comments)    Blisters, Please use "paper" tape   Amoxicillin-Pot Clavulanate Diarrhea    Not allergic  2014, Pt can take z pack only     Medications: I have reviewed the patient's current medications.  Results for orders placed or performed during the hospital encounter of 11/13/23 (from the past 48 hour(s))  CBC     Status: Abnormal   Collection Time: 11/13/23  8:27 AM  Result Value Ref Range   WBC 7.4 4.0 - 10.5 K/uL   RBC 4.69 3.87 - 5.11 MIL/uL   Hemoglobin 15.3 (H) 12.0  - 15.0 g/dL   HCT 03.4 74.2 - 59.5 %   MCV 96.8 80.0 - 100.0 fL   MCH 32.6 26.0 - 34.0 pg   MCHC 33.7 30.0 - 36.0 g/dL   RDW 63.8 75.6 - 43.3 %   Platelets 218 150 - 400 K/uL   nRBC 0.0 0.0 - 0.2 %    Comment: Performed at [facility], [address], [place], [place] [address]  Comprehensive metabolic panel     Status: Abnormal   Collection Time: 11/13/23  8:27 AM  Result Value Ref Range   Sodium 139 135 - 145 mmol/L   Potassium 3.9 3.5 - 5.1 mmol/L   Chloride 107 98 - 111 mmol/L   CO2 23 22 - 32 mmol/L   Glucose, Bld 111 (H) 70 - 99 mg/dL    Comment: Glucose reference range applies only to samples taken after fasting for at least 8 hours.   BUN 10 8 - 23 mg/dL   Creatinine, Ser 8.41 0.44 - 1.00 mg/dL   Calcium 9.5 8.9 - 66.0 mg/dL   Total Protein 7.2 6.5 - 8.1 g/dL   Albumin 3.8 3.5 - 5.0 g/dL   AST 19 15 - 41 U/L   ALT 14 0 - 44 U/L   Alkaline Phosphatase 75 38 - 126 U/L   Total Bilirubin 0.9 <1.2 mg/dL   GFR, Estimated >63 >01 mL/min    Comment: (NOTE) Calculated using the CKD-EPI Creatinine Equation (2021)    Anion gap 9 5 - 15    Comment: Performed at [facility], [address] [place], [place] [address]    DG Knee Complete 4 Views Left  Result Date: 11/13/2023 CLINICAL DATA:  Fall from standing. EXAM: LEFT KNEE - COMPLETE 4+ VIEW COMPARISON:  None Available. FINDINGS: No evidence of fracture or dislocation. Small joint effusion. Mild degenerative changes of the knee with chondrocalcinosis in the medial and lateral compartments. Soft tissues are unremarkable. IMPRESSION: No acute osseous abnormality.  Small knee joint effusion. Electronically Signed   By: Amy Fischer M.D.   On: 11/13/2023 10:39   DG Hand Complete Right  Result Date: 11/13/2023 CLINICAL DATA:  Fall from standing. EXAM: RIGHT HAND - COMPLETE 3+ VIEW COMPARISON:  Right hand radiograph dated March 20, 2010. FINDINGS: Diffusely decreased osseous mineralization. Subtle linear lucency at  the base of the first distal phalanx is seen on the lateral view. No definite evidence of intra-articular extension. Advanced joint space narrowing with  marginal osteophytosis and subchondral sclerosis of the first MCP and first through fifth interphalangeal joints. There is ulnar subluxation at the first MCP joint. Degenerative changes at the radiocarpal joint with chondrocalcinosis. Mild soft tissue swelling of the first digit. IMPRESSION: 1. Subtle linear lucency at the base of the first distal phalanx seen on the lateral view may represent a nondisplaced fracture. No definite evidence of intra-articular extension. 2. Advanced osteoarthritis, most pronounced at the first MCP joint with ulnar subluxation and the first through fifth interphalangeal joints. Electronically Signed   By: Amy Fischer M.D.   On: 11/13/2023 10:37   DG Hand Complete Left  Result Date: 11/13/2023 CLINICAL DATA:  Fall from standing. EXAM: LEFT HAND - COMPLETE 3+ VIEW COMPARISON:  Left hand radiograph dated May 14, 2016 FINDINGS: Diffusely decreased osseous mineralization. There is no evidence of acute fracture or dislocation. Joint space narrowing with marginal osteophytosis and subchondral sclerosis of the first [facility] joint in the second through fifth D IP and PIP joints. Mild degenerative changes at the radiocarpal joint with chondrocalcinosis. Soft tissues are unremarkable. IMPRESSION: 1. No acute osseous abnormality. 2. Osteoarthritis, most pronounced at the first [facility] and second through fifth PIP joints. Electronically Signed   By: Amy Fischer M.D.   On: 11/13/2023 10:27   DG Knee Complete 4 Views Right  Result Date: 11/13/2023 CLINICAL DATA:  Fall from standing. EXAM: RIGHT KNEE - COMPLETE 4+ VIEW COMPARISON:  None Available. FINDINGS: No evidence of fracture, dislocation, or significant joint effusion. Mild degenerative changes of the knee with chondrocalcinosis in the medial and lateral compartments. Mild prepatellar  soft tissue swelling. IMPRESSION: No acute osseous abnormality. Electronically Signed   By: Amy Fischer M.D.   On: 11/13/2023 10:23   CT HEAD WO CONTRAST  Result Date: 11/13/2023 CLINICAL DATA:  Provided history: Polytrauma, blunt. Head trauma, moderate/severe. Additional history: Fall from standing at home, found down, forehead trauma. EXAM: CT HEAD WITHOUT CONTRAST CT CERVICAL SPINE WITHOUT CONTRAST TECHNIQUE: Multidetector CT imaging of the head and cervical spine was performed following the standard protocol without intravenous contrast. Multiplanar CT image reconstructions of the cervical spine were also generated. RADIATION DOSE REDUCTION: This exam was performed according to the departmental dose-optimization program which includes automated exposure control, adjustment of the mA and/or kV according to patient size and/or use of iterative reconstruction technique. COMPARISON:  Head CT 09/19/2022.  Cervical spine CT 09/19/2022. FINDINGS: CT HEAD FINDINGS Brain: Generalized cerebral atrophy. Patchy and ill-defined hypoattenuation within the cerebral white matter, nonspecific but compatible with moderate chronic small vessel ischemic disease. Chronic lacunar infarct again noted within the left caudate nucleus. There is no acute intracranial hemorrhage. No demarcated cortical infarct. No extra-axial fluid collection. No evidence of an intracranial mass. No midline shift. Vascular: No hyperdense vessel.  Atherosclerotic calcifications. Skull: No calvarial fracture or aggressive osseous lesion. Sinuses/Orbits: No mass or acute finding within the imaged orbits. No significant paranasal sinus disease. CT CERVICAL SPINE FINDINGS Alignment: 2 mm C3-C4 grade 1 anterolisthesis. 4 mm C7-T1 grade 1 anterolisthesis. 3 mm T1-T2 grade 1 anterolisthesis. 2 mm T2-T3 grade 1 anterolisthesis. Levocurvature of the partially imaged upper thoracic spine. Skull base and vertebrae: The basion-dental and atlanto-dental  intervals are maintained.No evidence of acute fracture to the cervical spine. Congenital non-segmentation of the C2-C3 vertebrae. Soft tissues and spinal canal: No prevertebral swelling or visible canal hematoma. Disc levels: Cervical spondylosis with multilevel disc space narrowing, disc bulges/central disc protrusions, posterior disc osteophyte complexes, uncovertebral hypertrophy and facet arthrosis.  Disc space narrowing is greatest at C4-C5, C5-C6 and C6-C7 (advanced at these levels). No appreciable high-grade spinal canal stenosis. Multilevel bony neural foraminal narrowing. Degenerative changes also present at the C1-C2 articulation. Multilevel ventral osteophytes. Upper chest: No consolidation within the imaged lung apices. No visible pneumothorax. Emphysema. Other: Aortic atherosclerosis. IMPRESSION: CT head: 1.  No evidence of an acute intracranial abnormality. 2. Parenchymal atrophy and chronic small vessel ischemic disease. CT cervical spine: 1. No evidence of an acute cervical spine fracture. 2. Grade 1 spondylolisthesis at C3-C4, C7-T1, T1-T2 and T2-T3. 3. Congenital non-segmentation of the C2-C3 vertebrae. 4. Cervical spondylosis as described. 5. Aortic Atherosclerosis (ICD10-I70.0) and Emphysema (ICD10-J43.9). Electronically Signed   By: Jackey Loge D.O.   On: 11/13/2023 09:55   CT CERVICAL SPINE WO CONTRAST  Result Date: 11/13/2023 CLINICAL DATA:  Provided history: Polytrauma, blunt. Head trauma, moderate/severe. Additional history: Fall from standing at home, found down, forehead trauma. EXAM: CT HEAD WITHOUT CONTRAST CT CERVICAL SPINE WITHOUT CONTRAST TECHNIQUE: Multidetector CT imaging of the head and cervical spine was performed following the standard protocol without intravenous contrast. Multiplanar CT image reconstructions of the cervical spine were also generated. RADIATION DOSE REDUCTION: This exam was performed according to the departmental dose-optimization program which includes  automated exposure control, adjustment of the mA and/or kV according to patient size and/or use of iterative reconstruction technique. COMPARISON:  Head CT 09/19/2022.  Cervical spine CT 09/19/2022. FINDINGS: CT HEAD FINDINGS Brain: Generalized cerebral atrophy. Patchy and ill-defined hypoattenuation within the cerebral white matter, nonspecific but compatible with moderate chronic small vessel ischemic disease. Chronic lacunar infarct again noted within the left caudate nucleus. There is no acute intracranial hemorrhage. No demarcated cortical infarct. No extra-axial fluid collection. No evidence of an intracranial mass. No midline shift. Vascular: No hyperdense vessel.  Atherosclerotic calcifications. Skull: No calvarial fracture or aggressive osseous lesion. Sinuses/Orbits: No mass or acute finding within the imaged orbits. No significant paranasal sinus disease. CT CERVICAL SPINE FINDINGS Alignment: 2 mm C3-C4 grade 1 anterolisthesis. 4 mm C7-T1 grade 1 anterolisthesis. 3 mm T1-T2 grade 1 anterolisthesis. 2 mm T2-T3 grade 1 anterolisthesis. Levocurvature of the partially imaged upper thoracic spine. Skull base and vertebrae: The basion-dental and atlanto-dental intervals are maintained.No evidence of acute fracture to the cervical spine. Congenital non-segmentation of the C2-C3 vertebrae. Soft tissues and spinal canal: No prevertebral swelling or visible canal hematoma. Disc levels: Cervical spondylosis with multilevel disc space narrowing, disc bulges/central disc protrusions, posterior disc osteophyte complexes, uncovertebral hypertrophy and facet arthrosis. Disc space narrowing is greatest at C4-C5, C5-C6 and C6-C7 (advanced at these levels). No appreciable high-grade spinal canal stenosis. Multilevel bony neural foraminal narrowing. Degenerative changes also present at the C1-C2 articulation. Multilevel ventral osteophytes. Upper chest: No consolidation within the imaged lung apices. No visible pneumothorax.  Emphysema. Other: Aortic atherosclerosis. IMPRESSION: CT head: 1.  No evidence of an acute intracranial abnormality. 2. Parenchymal atrophy and chronic small vessel ischemic disease. CT cervical spine: 1. No evidence of an acute cervical spine fracture. 2. Grade 1 spondylolisthesis at C3-C4, C7-T1, T1-T2 and T2-T3. 3. Congenital non-segmentation of the C2-C3 vertebrae. 4. Cervical spondylosis as described. 5. Aortic Atherosclerosis (ICD10-I70.0) and Emphysema (ICD10-J43.9). Electronically Signed   By: Jackey Loge D.O.   On: 11/13/2023 09:55   DG Pelvis Portable  Result Date: 11/13/2023 CLINICAL DATA:  Fall. EXAM: PORTABLE PELVIS 1-2 VIEWS COMPARISON:  Hip radiographs dated August 30, 2022. FINDINGS: There is no evidence of acute fracture or diastasis. Remote healed left inferior  pubic ramus fracture. Similar irregularity of the right superior pubic ramus, likely chronic. The femoral heads are seated within the acetabula. Degenerative changes of the bilateral hips. The sacroiliac joints and pubic symphysis appear anatomically aligned. Vascular calcifications are noted. IMPRESSION: 1. No acute fracture identified on single AP pelvic radiograph. 2. Remote healed left inferior pubic ramus fracture. Similar irregularity of the right superior pubic ramus likely represents a remote healed fracture. Electronically Signed   By: Amy Fischer M.D.   On: 11/13/2023 09:04   DG Chest Port 1 View  Result Date: 11/13/2023 CLINICAL DATA:  Fall. EXAM: PORTABLE CHEST 1 VIEW COMPARISON:  Chest radiograph dated December 28, 2022. FINDINGS: The heart size and mediastinal contours are within normal limits. Aortic atherosclerosis. Prior median sternotomy and mitral valve replacement. Lung volumes are increased with similar emphysematous changes and chronic appearing coarsening of the bilateral interstitium. No focal consolidation, sizeable pleural effusion, or pneumothorax. Remote healed left seventh rib fracture. No acute  osseous abnormality. IMPRESSION: No acute findings in the chest. Electronically Signed   By: Amy Fischer M.D.   On: 11/13/2023 08:58    Review of Systems  HENT:  Negative for ear discharge, ear pain, hearing loss and tinnitus.   Eyes:  Negative for photophobia and pain.  Respiratory:  Negative for cough and shortness of breath.   Cardiovascular:  Negative for chest pain.  Gastrointestinal:  Negative for abdominal pain, nausea and vomiting.  Genitourinary:  Negative for dysuria, flank pain, frequency and urgency.  Musculoskeletal:  Positive for arthralgias (Bilateral hands and knees). Negative for back pain, myalgias and neck pain.  Neurological:  Negative for dizziness and headaches.  Hematological:  Does not bruise/bleed easily.  Psychiatric/Behavioral:  The patient is not nervous/anxious.    Blood pressure (!) 131/99, pulse 86, temperature (!) 97.3 F (36.3 C), temperature source Oral, resp. rate 20, height 5\' 1"  (1.549 m), weight 47.2 kg, SpO2 100%. Physical Exam Constitutional:      General: She is not in acute distress.    Appearance: She is well-developed. She is not diaphoretic.  HENT:     Head: Normocephalic and atraumatic.  Eyes:     General: No scleral icterus.       Right eye: No discharge.        Left eye: No discharge.     Conjunctiva/sclera: Conjunctivae normal.  Cardiovascular:     Rate and Rhythm: Normal rate and regular rhythm.  Pulmonary:     Effort: Pulmonary effort is normal. No respiratory distress.  Musculoskeletal:     Cervical back: Normal range of motion.     Comments: Right shoulder, elbow, wrist, digits- no skin wounds, thumb edematous and ecchymotic, mod diffuse TTP, AROM IP and MCP joints limited by pain but grossly intact, no instability, no blocks to motion  Sens  Ax/R/M/U intact  Mot   Ax/ R/ PIN/ M/ AIN/ U intact  Rad 2+  Skin:    General: Skin is warm and dry.  Neurological:     Mental Status: She is alert.  Psychiatric:        Mood and  Affect: Mood normal.        Behavior: Behavior normal.     Assessment/Plan: Right thumb pain -- Will get removable thumb spica for comfort. May f/u with Dr. Yehuda Budd in 2-3 weeks if thumb doesn't improve on its own.    Freeman Caldron, PA-C Orthopedic Surgery [phone] 11/13/2023, 10:48 AM

## 2023-11-13 NOTE — ED Triage Notes (Signed)
PT BIB [facility] after falling from standing height at home last night around 2300, found this morning by daughter. Hx of Afib, endorses hitting forehead. Aox4, [facility] vitals WDL. Swollen right thumb.

## 2023-11-13 NOTE — ED Provider Notes (Signed)
EMERGENCY DEPARTMENT AT [facility] Provider Note   CSN: [id number] Arrival date & time: 11/13/23  1914     History  Chief Complaint  Patient presents with   Amy Fischer    Amy Fischer is a 89 y.o. female.  HPI   89 year old female history of paroxysmal atrial fibrillation, on chronic anticoagulation, gait imbalance, presents today after fall last night.  States she was getting ready for bed when she lost her balance and fell to the floor.  She fell forward striking her head and injuring her right hand.  She was unable to get up from the floor as is her usual if she falls.  She states she falls about twice a year.  She was tried to call her daughter last night.  She got a hold of her daughter this morning who came over.  She is unable to get her up.  EMS came and they did stand her at the scene.  She is complaining of some headache and some right hand pain.  Reports that she has some pain in her left knee.  Home Medications Prior to Admission medications   Medication Sig Start Date End Date Taking? Authorizing Provider  apixaban (ELIQUIS) 2.5 MG TABS tablet TAKE 1 TABLET TWICE A DAY 05/05/23  Yes Crenshaw, Madolyn Frieze, MD  albuterol (VENTOLIN HFA) 108 (90 Base) MCG/ACT inhaler Inhale 2 puffs into the lungs every 6 (six) hours as needed for wheezing or shortness of breath. 12/24/21   Kozlow, Alvira Philips, MD  cholecalciferol (VITAMIN D) 1000 units tablet Take 1,000 Units by mouth daily.    [provider]  ezetimibe (ZETIA) 10 MG tablet TAKE 1 TABLET DAILY 08/04/23   Panosh, Neta Mends, MD  folic acid (FOLVITE) 1 MG tablet TAKE 1 TABLET DAILY 08/13/23   Panosh, Neta Mends, MD  furosemide (LASIX) 20 MG tablet Take one tablet as needed for shortness of breathe and leg edema 07/10/23   Azalee Course, PA  ipratropium (ATROVENT) 0.06 % nasal spray USE 1 SPRAY IN EACH NOSTRIL TWICE A DAY AS NEEDED FOR RHINITIS 04/27/23   Worthy Rancher B, FNP  levothyroxine (SYNTHROID) 100 MCG tablet TAKE 1  TABLET EVERY MORNING ON AN EMPTY STOMACH 10/14/23   Worthy Rancher B, FNP  Melatonin 3 MG TABS Take 3 mg by mouth at bedtime.     [provider]  Polyethyl Glycol-Propyl Glycol 0.4-0.3 % SOLN Place 1 drop into both eyes 2 (two) times daily.    [provider]  rosuvastatin (CRESTOR) 5 MG tablet Take 0.5 tablets (2.5 mg total) by mouth daily. 08/03/23   Panosh, Neta Mends, MD  SALINE NASAL SPRAY NA Place 1 spray into both nostrils in the morning and at bedtime.    [provider]  vitamin B-12 (CYANOCOBALAMIN) 1000 MCG tablet Take 1,000 mcg by mouth daily.    [provider]      Allergies    Codeine, Risedronate sodium, Statins, Tape, and Amoxicillin-pot clavulanate    Review of Systems   Review of Systems  Physical Exam Updated Vital Signs BP (!) 131/99   Pulse 86   Temp (!) 97.3 F (36.3 C) (Oral)   Resp 20   Ht 1.549 m (5\' 1" )   Wt 47.2 kg   SpO2 100%   BMI 19.65 kg/m  Physical Exam Vitals reviewed.  HENT:     Head: Normocephalic.     Comments: No discoloration but mild swelling right forehead    Right  Ear: Tympanic membrane and external ear normal.     Left Ear: Tympanic membrane and external ear normal.     Nose: Nose normal.     Mouth/Throat:     Pharynx: Oropharynx is clear.  Eyes:     Extraocular Movements: Extraocular movements intact.     Pupils: Pupils are equal, round, and reactive to light.  Cardiovascular:     Rate and Rhythm: Normal rate.     Comments: Chest wall visually examined no obvious external signs of trauma no crepitus and no palpable tenderness Pulmonary:     Effort: Pulmonary effort is normal.     Breath sounds: Normal breath sounds.  Abdominal:     General: Bowel sounds are normal.     Palpations: Abdomen is soft.     Comments: Abdomen visually inspected with no obvious external signs of trauma  Musculoskeletal:     Cervical back: Normal range of motion. No tenderness.     Comments: Tenderness, swelling,  discoloration right thumb thenar eminence to tip is discolored purpleish no obvious deformity sensation intact Remainder of upper extremities and lower extremities without any obvious signs of trauma Full active range of motion Patient complained of some pain in left knee and there may be some swelling but no point tenderness no crepitus Cervical, thoracic, lumbar spine visually inspected no obvious signs of trauma and palpated without point tenderness  Skin:    Capillary Refill: Capillary refill takes less than 2 seconds.  Neurological:     General: No focal deficit present.     Mental Status: She is alert. Mental status is at baseline.  Psychiatric:        Mood and Affect: Mood normal.     ED Results / Procedures / Treatments   Labs (all labs ordered are listed, but only abnormal results are displayed) Labs Reviewed  CBC - Abnormal; Notable for the following components:      Result Value   Hemoglobin 15.3 (*)    All other components within normal limits  COMPREHENSIVE METABOLIC PANEL - Abnormal; Notable for the following components:   Glucose, Bld 111 (*)    All other components within normal limits    EKG None  Radiology DG Knee Complete 4 Views Left  Result Date: 11/13/2023 CLINICAL DATA:  Fall from standing. EXAM: LEFT KNEE - COMPLETE 4+ VIEW COMPARISON:  None Available. FINDINGS: No evidence of fracture or dislocation. Small joint effusion. Mild degenerative changes of the knee with chondrocalcinosis in the medial and lateral compartments. Soft tissues are unremarkable. IMPRESSION: No acute osseous abnormality.  Small knee joint effusion. Electronically Signed   By: Hart Robinsons M.D.   On: 11/13/2023 10:39   DG Hand Complete Right  Result Date: 11/13/2023 CLINICAL DATA:  Fall from standing. EXAM: RIGHT HAND - COMPLETE 3+ VIEW COMPARISON:  Right hand radiograph dated March 20, 2010. FINDINGS: Diffusely decreased osseous mineralization. Subtle linear lucency at the  base of the first distal phalanx is seen on the lateral view. No definite evidence of intra-articular extension. Advanced joint space narrowing with marginal osteophytosis and subchondral sclerosis of the first MCP and first through fifth interphalangeal joints. There is ulnar subluxation at the first MCP joint. Degenerative changes at the radiocarpal joint with chondrocalcinosis. Mild soft tissue swelling of the first digit. IMPRESSION: 1. Subtle linear lucency at the base of the first distal phalanx seen on the lateral view may represent a nondisplaced fracture. No definite evidence of intra-articular extension. 2. Advanced osteoarthritis, most pronounced  at the first MCP joint with ulnar subluxation and the first through fifth interphalangeal joints. Electronically Signed   By: Hart Robinsons M.D.   On: 11/13/2023 10:37   DG Hand Complete Left  Result Date: 11/13/2023 CLINICAL DATA:  Fall from standing. EXAM: LEFT HAND - COMPLETE 3+ VIEW COMPARISON:  Left hand radiograph dated May 14, 2016 FINDINGS: Diffusely decreased osseous mineralization. There is no evidence of acute fracture or dislocation. Joint space narrowing with marginal osteophytosis and subchondral sclerosis of the first [facility] joint in the second through fifth D IP and PIP joints. Mild degenerative changes at the radiocarpal joint with chondrocalcinosis. Soft tissues are unremarkable. IMPRESSION: 1. No acute osseous abnormality. 2. Osteoarthritis, most pronounced at the first [facility] and second through fifth PIP joints. Electronically Signed   By: Hart Robinsons M.D.   On: 11/13/2023 10:27   DG Knee Complete 4 Views Right  Result Date: 11/13/2023 CLINICAL DATA:  Fall from standing. EXAM: RIGHT KNEE - COMPLETE 4+ VIEW COMPARISON:  None Available. FINDINGS: No evidence of fracture, dislocation, or significant joint effusion. Mild degenerative changes of the knee with chondrocalcinosis in the medial and lateral compartments. Mild prepatellar  soft tissue swelling. IMPRESSION: No acute osseous abnormality. Electronically Signed   By: Hart Robinsons M.D.   On: 11/13/2023 10:23   CT HEAD WO CONTRAST  Result Date: 11/13/2023 CLINICAL DATA:  Provided history: Polytrauma, blunt. Head trauma, moderate/severe. Additional history: Fall from standing at home, found down, forehead trauma. EXAM: CT HEAD WITHOUT CONTRAST CT CERVICAL SPINE WITHOUT CONTRAST TECHNIQUE: Multidetector CT imaging of the head and cervical spine was performed following the standard protocol without intravenous contrast. Multiplanar CT image reconstructions of the cervical spine were also generated. RADIATION DOSE REDUCTION: This exam was performed according to the departmental dose-optimization program which includes automated exposure control, adjustment of the mA and/or kV according to patient size and/or use of iterative reconstruction technique. COMPARISON:  Head CT 09/19/2022.  Cervical spine CT 09/19/2022. FINDINGS: CT HEAD FINDINGS Brain: Generalized cerebral atrophy. Patchy and ill-defined hypoattenuation within the cerebral white matter, nonspecific but compatible with moderate chronic small vessel ischemic disease. Chronic lacunar infarct again noted within the left caudate nucleus. There is no acute intracranial hemorrhage. No demarcated cortical infarct. No extra-axial fluid collection. No evidence of an intracranial mass. No midline shift. Vascular: No hyperdense vessel.  Atherosclerotic calcifications. Skull: No calvarial fracture or aggressive osseous lesion. Sinuses/Orbits: No mass or acute finding within the imaged orbits. No significant paranasal sinus disease. CT CERVICAL SPINE FINDINGS Alignment: 2 mm C3-C4 grade 1 anterolisthesis. 4 mm C7-T1 grade 1 anterolisthesis. 3 mm T1-T2 grade 1 anterolisthesis. 2 mm T2-T3 grade 1 anterolisthesis. Levocurvature of the partially imaged upper thoracic spine. Skull base and vertebrae: The basion-dental and atlanto-dental  intervals are maintained.No evidence of acute fracture to the cervical spine. Congenital non-segmentation of the C2-C3 vertebrae. Soft tissues and spinal canal: No prevertebral swelling or visible canal hematoma. Disc levels: Cervical spondylosis with multilevel disc space narrowing, disc bulges/central disc protrusions, posterior disc osteophyte complexes, uncovertebral hypertrophy and facet arthrosis. Disc space narrowing is greatest at C4-C5, C5-C6 and C6-C7 (advanced at these levels). No appreciable high-grade spinal canal stenosis. Multilevel bony neural foraminal narrowing. Degenerative changes also present at the C1-C2 articulation. Multilevel ventral osteophytes. Upper chest: No consolidation within the imaged lung apices. No visible pneumothorax. Emphysema. Other: Aortic atherosclerosis. IMPRESSION: CT head: 1.  No evidence of an acute intracranial abnormality. 2. Parenchymal atrophy and chronic small vessel ischemic disease. CT  cervical spine: 1. No evidence of an acute cervical spine fracture. 2. Grade 1 spondylolisthesis at C3-C4, C7-T1, T1-T2 and T2-T3. 3. Congenital non-segmentation of the C2-C3 vertebrae. 4. Cervical spondylosis as described. 5. Aortic Atherosclerosis (ICD10-I70.0) and Emphysema (ICD10-J43.9). Electronically Signed   By: Jackey Loge D.O.   On: 11/13/2023 09:55   CT CERVICAL SPINE WO CONTRAST  Result Date: 11/13/2023 CLINICAL DATA:  Provided history: Polytrauma, blunt. Head trauma, moderate/severe. Additional history: Fall from standing at home, found down, forehead trauma. EXAM: CT HEAD WITHOUT CONTRAST CT CERVICAL SPINE WITHOUT CONTRAST TECHNIQUE: Multidetector CT imaging of the head and cervical spine was performed following the standard protocol without intravenous contrast. Multiplanar CT image reconstructions of the cervical spine were also generated. RADIATION DOSE REDUCTION: This exam was performed according to the departmental dose-optimization program which includes  automated exposure control, adjustment of the mA and/or kV according to patient size and/or use of iterative reconstruction technique. COMPARISON:  Head CT 09/19/2022.  Cervical spine CT 09/19/2022. FINDINGS: CT HEAD FINDINGS Brain: Generalized cerebral atrophy. Patchy and ill-defined hypoattenuation within the cerebral white matter, nonspecific but compatible with moderate chronic small vessel ischemic disease. Chronic lacunar infarct again noted within the left caudate nucleus. There is no acute intracranial hemorrhage. No demarcated cortical infarct. No extra-axial fluid collection. No evidence of an intracranial mass. No midline shift. Vascular: No hyperdense vessel.  Atherosclerotic calcifications. Skull: No calvarial fracture or aggressive osseous lesion. Sinuses/Orbits: No mass or acute finding within the imaged orbits. No significant paranasal sinus disease. CT CERVICAL SPINE FINDINGS Alignment: 2 mm C3-C4 grade 1 anterolisthesis. 4 mm C7-T1 grade 1 anterolisthesis. 3 mm T1-T2 grade 1 anterolisthesis. 2 mm T2-T3 grade 1 anterolisthesis. Levocurvature of the partially imaged upper thoracic spine. Skull base and vertebrae: The basion-dental and atlanto-dental intervals are maintained.No evidence of acute fracture to the cervical spine. Congenital non-segmentation of the C2-C3 vertebrae. Soft tissues and spinal canal: No prevertebral swelling or visible canal hematoma. Disc levels: Cervical spondylosis with multilevel disc space narrowing, disc bulges/central disc protrusions, posterior disc osteophyte complexes, uncovertebral hypertrophy and facet arthrosis. Disc space narrowing is greatest at C4-C5, C5-C6 and C6-C7 (advanced at these levels). No appreciable high-grade spinal canal stenosis. Multilevel bony neural foraminal narrowing. Degenerative changes also present at the C1-C2 articulation. Multilevel ventral osteophytes. Upper chest: No consolidation within the imaged lung apices. No visible pneumothorax.  Emphysema. Other: Aortic atherosclerosis. IMPRESSION: CT head: 1.  No evidence of an acute intracranial abnormality. 2. Parenchymal atrophy and chronic small vessel ischemic disease. CT cervical spine: 1. No evidence of an acute cervical spine fracture. 2. Grade 1 spondylolisthesis at C3-C4, C7-T1, T1-T2 and T2-T3. 3. Congenital non-segmentation of the C2-C3 vertebrae. 4. Cervical spondylosis as described. 5. Aortic Atherosclerosis (ICD10-I70.0) and Emphysema (ICD10-J43.9). Electronically Signed   By: Jackey Loge D.O.   On: 11/13/2023 09:55   DG Pelvis Portable  Result Date: 11/13/2023 CLINICAL DATA:  Fall. EXAM: PORTABLE PELVIS 1-2 VIEWS COMPARISON:  Hip radiographs dated August 30, 2022. FINDINGS: There is no evidence of acute fracture or diastasis. Remote healed left inferior pubic ramus fracture. Similar irregularity of the right superior pubic ramus, likely chronic. The femoral heads are seated within the acetabula. Degenerative changes of the bilateral hips. The sacroiliac joints and pubic symphysis appear anatomically aligned. Vascular calcifications are noted. IMPRESSION: 1. No acute fracture identified on single AP pelvic radiograph. 2. Remote healed left inferior pubic ramus fracture. Similar irregularity of the right superior pubic ramus likely represents a remote healed fracture. Electronically  Signed   By: Hart Robinsons M.D.   On: 11/13/2023 09:04   DG Chest Port 1 View  Result Date: 11/13/2023 CLINICAL DATA:  Fall. EXAM: PORTABLE CHEST 1 VIEW COMPARISON:  Chest radiograph dated December 28, 2022. FINDINGS: The heart size and mediastinal contours are within normal limits. Aortic atherosclerosis. Prior median sternotomy and mitral valve replacement. Lung volumes are increased with similar emphysematous changes and chronic appearing coarsening of the bilateral interstitium. No focal consolidation, sizeable pleural effusion, or pneumothorax. Remote healed left seventh rib fracture. No acute  osseous abnormality. IMPRESSION: No acute findings in the chest. Electronically Signed   By: Hart Robinsons M.D.   On: 11/13/2023 08:58    Procedures .Critical Care  Performed by: Margarita Grizzle, MD Authorized by: Margarita Grizzle, MD   Critical care provider statement:    Critical care time (minutes):  30   Critical care start time:  11/13/2023 8:10 AM   Critical care end time:  11/13/2023 10:59 AM   Critical care was necessary to treat or prevent imminent or life-threatening deterioration of the following conditions:  Trauma   Critical care was time spent personally by me on the following activities:  Development of treatment plan with patient or surrogate, discussions with consultants, evaluation of patient's response to treatment, examination of patient, ordering and review of laboratory studies, ordering and review of radiographic studies, ordering and performing treatments and interventions, pulse oximetry, re-evaluation of patient's condition and review of old charts     Medications Ordered in ED Medications - No data to display  ED Course/ Medical Decision Making/ A&P Clinical Course as of 11/13/23 1059  Fri Nov 13, 2023  0917 Portable pelvis x-Asiya Cutbirth reviewed and interpreted notes acute abnormality and radiologist interpretation notes no acute fracture [DR]  0917 Chest x-Daviyon Widmayer with no acute findings [DR]  1010 BC reviewed interpreted and within normal limits Complete metabolic panel reviewed interpreted and within normal limits [DR]  1010 CT head reviewed and interpreted no evidence of acute intracranial abnormality noted radiologist interpretation concurs CT cervical spine shows no evidence of acute cervical spine fracture [DR]  1011 Chest x-Bartholomew Ramesh without acute abnormalities per radiologist [DR]  1034 Right hand x-Raylon Lamson reviewed interpreted and question fracture at the interphalangeal joint of the right thumb and subluxation CmC joint of the right thumb-radiologist interpretation is  negative for fracture [DR]  1035 Chest x-Solenne Manwarren reviewed interpreted and no disc abnormality noted [DR]  1035 Left knee x-Charlcie Prisco reviewed interpreted no evidence of acute abnormality noted radiologist interpretation concurs [DR]    Clinical Course User Index [DR] Margarita Grizzle, MD                                 Medical Decision Making Amount and/or Complexity of Data Reviewed Labs: ordered. Radiology: ordered.   89 year old female on Eliquis for paroxysmal atrial fibrillation presents today after fall.  Patient has good recall of the event and states that it was mechanical fall due to her balance issues. Differential diagnosis includes syncope, stroke, seizure Based on history consistent with mechanical fall and proceed with appropriate trauma workup Patient with contusion to forehead, on blood thinners, and contusion to hand.  She also had some pain in her left knee. Appropriate imaging obtained including CT head, cervical spine, knee, and hands No evidence of fracture or acute intracranial hemorrhage noted on CT, cervical spine, or knee. Hand x-rays reviewed and radiologist interpreted as normal.  There  is a lot of DJD and osteoporosis with chronic changes.  Based on her injuries, question subluxation and possible fracture of the interphalangeal joint on the right hand.  Care discussed with Earney Hamburg, PA-C, on-call for Ortho and he will review films and evaluate patient.  Plan Velcro splint.  Discussed pain management with acetaminophen, cold therapy, elevation and follow-up to be arranged by Earney Hamburg Patient appears to be stable for discharge. We have discussed return precautions including headache or change in mental status or any new or worsening symptoms.        Final Clinical Impression(s) / ED Diagnoses Final diagnoses:  Fall, initial encounter  Injury of right thumb, initial encounter  Chronic anticoagulation    Rx / DC Orders ED Discharge Orders     None          Margarita Grizzle, MD 11/13/23 1059

## 2023-11-17 DIAGNOSIS — Z9181 History of falling: Secondary | ICD-10-CM | POA: Diagnosis not present

## 2023-11-17 DIAGNOSIS — G629 Polyneuropathy, unspecified: Secondary | ICD-10-CM | POA: Diagnosis not present

## 2023-11-17 DIAGNOSIS — R278 Other lack of coordination: Secondary | ICD-10-CM | POA: Diagnosis not present

## 2023-11-17 DIAGNOSIS — M1990 Unspecified osteoarthritis, unspecified site: Secondary | ICD-10-CM | POA: Diagnosis not present

## 2023-11-19 DIAGNOSIS — M1990 Unspecified osteoarthritis, unspecified site: Secondary | ICD-10-CM | POA: Diagnosis not present

## 2023-11-19 DIAGNOSIS — G629 Polyneuropathy, unspecified: Secondary | ICD-10-CM | POA: Diagnosis not present

## 2023-11-19 DIAGNOSIS — R278 Other lack of coordination: Secondary | ICD-10-CM | POA: Diagnosis not present

## 2023-11-19 DIAGNOSIS — Z9181 History of falling: Secondary | ICD-10-CM | POA: Diagnosis not present

## 2023-11-24 DIAGNOSIS — G629 Polyneuropathy, unspecified: Secondary | ICD-10-CM | POA: Diagnosis not present

## 2023-11-24 DIAGNOSIS — R278 Other lack of coordination: Secondary | ICD-10-CM | POA: Diagnosis not present

## 2023-11-24 DIAGNOSIS — Z9181 History of falling: Secondary | ICD-10-CM | POA: Diagnosis not present

## 2023-11-24 DIAGNOSIS — M1990 Unspecified osteoarthritis, unspecified site: Secondary | ICD-10-CM | POA: Diagnosis not present

## 2023-12-01 DIAGNOSIS — M1990 Unspecified osteoarthritis, unspecified site: Secondary | ICD-10-CM | POA: Diagnosis not present

## 2023-12-01 DIAGNOSIS — R278 Other lack of coordination: Secondary | ICD-10-CM | POA: Diagnosis not present

## 2023-12-01 DIAGNOSIS — G629 Polyneuropathy, unspecified: Secondary | ICD-10-CM | POA: Diagnosis not present

## 2023-12-01 DIAGNOSIS — Z9181 History of falling: Secondary | ICD-10-CM | POA: Diagnosis not present

## 2023-12-07 ENCOUNTER — Telehealth: Payer: Self-pay

## 2023-12-07 NOTE — Telephone Encounter (Signed)
Transition Care Management Unsuccessful Follow-up Telephone Call  Date of discharge and from where:  [facility] 11/15  Attempts:  2nd  Reason for unsuccessful TCM follow-up call:  No answer/busy   Lenard Forth Loughman  [facility], [facility] Guide, Phone: [phone] Website: [email or web address]

## 2023-12-07 NOTE — Telephone Encounter (Signed)
 Transition Care Management Unsuccessful Follow-up Telephone Call  Date of discharge and from where:  [facility] 11/15  Attempts:  1st Attempt  Reason for unsuccessful TCM follow-up call:  No answer/busy   Lenard Forth Spencer  [facility], [facility] Guide, Phone: [phone] Website: [email or web address]

## 2023-12-08 DIAGNOSIS — R278 Other lack of coordination: Secondary | ICD-10-CM | POA: Diagnosis not present

## 2023-12-08 DIAGNOSIS — Z9181 History of falling: Secondary | ICD-10-CM | POA: Diagnosis not present

## 2023-12-08 DIAGNOSIS — M1990 Unspecified osteoarthritis, unspecified site: Secondary | ICD-10-CM | POA: Diagnosis not present

## 2023-12-08 DIAGNOSIS — G629 Polyneuropathy, unspecified: Secondary | ICD-10-CM | POA: Diagnosis not present

## 2023-12-10 DIAGNOSIS — Z9181 History of falling: Secondary | ICD-10-CM | POA: Diagnosis not present

## 2023-12-10 DIAGNOSIS — R278 Other lack of coordination: Secondary | ICD-10-CM | POA: Diagnosis not present

## 2023-12-10 DIAGNOSIS — G629 Polyneuropathy, unspecified: Secondary | ICD-10-CM | POA: Diagnosis not present

## 2023-12-10 DIAGNOSIS — M1990 Unspecified osteoarthritis, unspecified site: Secondary | ICD-10-CM | POA: Diagnosis not present

## 2023-12-15 DIAGNOSIS — Z9181 History of falling: Secondary | ICD-10-CM | POA: Diagnosis not present

## 2023-12-15 DIAGNOSIS — R278 Other lack of coordination: Secondary | ICD-10-CM | POA: Diagnosis not present

## 2023-12-15 DIAGNOSIS — M1990 Unspecified osteoarthritis, unspecified site: Secondary | ICD-10-CM | POA: Diagnosis not present

## 2023-12-15 DIAGNOSIS — G629 Polyneuropathy, unspecified: Secondary | ICD-10-CM | POA: Diagnosis not present

## 2023-12-18 DIAGNOSIS — G629 Polyneuropathy, unspecified: Secondary | ICD-10-CM | POA: Diagnosis not present

## 2023-12-18 DIAGNOSIS — Z9181 History of falling: Secondary | ICD-10-CM | POA: Diagnosis not present

## 2023-12-18 DIAGNOSIS — R278 Other lack of coordination: Secondary | ICD-10-CM | POA: Diagnosis not present

## 2023-12-18 DIAGNOSIS — M1990 Unspecified osteoarthritis, unspecified site: Secondary | ICD-10-CM | POA: Diagnosis not present

## 2024-01-04 ENCOUNTER — Ambulatory Visit (INDEPENDENT_AMBULATORY_CARE_PROVIDER_SITE_OTHER): Payer: Medicare Other

## 2024-01-04 VITALS — Ht 61.0 in | Wt 102.0 lb

## 2024-01-04 DIAGNOSIS — Z0000 Encounter for general adult medical examination without abnormal findings: Secondary | ICD-10-CM | POA: Diagnosis not present

## 2024-01-04 NOTE — Patient Instructions (Addendum)
 Ms. Amy Fischer , Thank you for taking time to come for your Medicare Wellness Visit. I appreciate your ongoing commitment to your health goals. Please review the following plan we discussed and let me know if I can assist you in the future.   Referrals/Orders/Follow-Ups/Clinician Recommendations:   This is a list of the screening recommended for you and due dates:  Health Maintenance  Topic Date Due   DTaP/Tdap/Td vaccine (3 - Td or Tdap) 09/22/2021   COVID-19 Vaccine (4 - 2024-25 season) 08/30/2023   Medicare Annual Wellness Visit  01/03/2025   Pneumonia Vaccine  Completed   Flu Shot  Completed   DEXA scan (bone density measurement)  Completed   Zoster (Shingles) Vaccine  Completed   HPV Vaccine  Aged Out    Advanced directives: (Copy Requested) Please bring a copy of your health care power of attorney and living will to the office to be added to your chart at your convenience.  Next Medicare Annual Wellness Visit scheduled for next year: Yes

## 2024-01-04 NOTE — Progress Notes (Signed)
 Subjective:   Amy Fischer is a 89 y.o. female who presents for Medicare Annual (Subsequent) preventive examination.  Visit Complete: Virtual I connected with  Lulia Schriner Mcnew on 01/04/24 by a audio enabled telemedicine application and verified that I am speaking with the correct person using two identifiers.  Patient Location: Home  Provider Location: Home Office  I discussed the limitations of evaluation and management by telemedicine. The patient expressed understanding and agreed to proceed.  Vital Signs: Because this visit was a virtual/telehealth visit, some criteria may be missing or patient reported. Any vitals not documented were not able to be obtained and vitals that have been documented are patient reported.    Cardiac Risk Factors include: advanced age (>20men, >35 women);hypertension     Objective:    Today's Vitals   01/04/24 1613  Weight: 102 lb (46.3 kg)  Height: 5' 1 (1.549 m)   Body mass index is 19.27 kg/m.     01/04/2024    4:24 PM 12/29/2022    8:22 PM 12/28/2022   10:06 AM 09/19/2022   12:35 AM 09/16/2022    3:27 PM 04/05/2022   11:34 AM 11/04/2021    5:13 PM  Advanced Directives  Does Patient Have a Medical Advance Directive? Yes No No Yes Yes Yes No  Type of Estate Agent of [place];Living will   Living will Healthcare Power of [place];Living will Living will   Copy of Healthcare Power of Attorney in Chart? No - copy requested    No - copy requested    Would patient like information on creating a medical advance directive?  No - Patient declined No - Patient declined    No - Patient declined    Current Medications (verified) Outpatient Encounter Medications as of 01/04/2024  Medication Sig   albuterol  (VENTOLIN  HFA) 108 (90 Base) MCG/ACT inhaler Inhale 2 puffs into the lungs every 6 (six) hours as needed for wheezing or shortness of breath.   apixaban  (ELIQUIS ) 2.5 MG TABS tablet TAKE 1 TABLET TWICE A DAY   cholecalciferol   (VITAMIN D ) 1000 units tablet Take 1,000 Units by mouth daily.   ezetimibe  (ZETIA ) 10 MG tablet TAKE 1 TABLET DAILY   folic acid  (FOLVITE ) 1 MG tablet TAKE 1 TABLET DAILY   furosemide  (LASIX ) 20 MG tablet Take one tablet as needed for shortness of breathe and leg edema   ipratropium (ATROVENT ) 0.06 % nasal spray USE 1 SPRAY IN EACH NOSTRIL TWICE A DAY AS NEEDED FOR RHINITIS   levothyroxine  (SYNTHROID ) 100 MCG tablet TAKE 1 TABLET EVERY MORNING ON AN EMPTY STOMACH   Melatonin 3 MG TABS Take 3 mg by mouth at bedtime.    Polyethyl Glycol-Propyl Glycol 0.4-0.3 % SOLN Place 1 drop into both eyes 2 (two) times daily.   rosuvastatin  (CRESTOR ) 5 MG tablet Take 0.5 tablets (2.5 mg total) by mouth daily.   SALINE NASAL SPRAY NA Place 1 spray into both nostrils in the morning and at bedtime.   vitamin B-12 (CYANOCOBALAMIN ) 1000 MCG tablet Take 1,000 mcg by mouth daily.   No facility-administered encounter medications on file as of 01/04/2024.    Allergies (verified) Codeine, Risedronate sodium, Statins, Tape, and Amoxicillin -pot clavulanate   History: Past Medical History:  Diagnosis Date   Allergy    Anemia    Asymptomatic carotid artery stenosis    R ICA 40% stenosis on CTA 12/2011.   Bladder polyps    Cataract    BILATERAL-REMOVED   CHF (congestive heart  failure) (HCC)    Diverticulosis    Elevated blood pressure 03/25/2011   Bp readings borderline today has hx of elvation  In office and ok at home   Not checke recently   She gfeels was elevated from anxiety     Episodic recurrent vertigo    MRI Head 2003   Esophageal spasm    GERD (gastroesophageal reflux disease)    Hepatic hemangioma    History of hepatitis    unknown type   Hyperlipidemia    Hyperplastic colon polyp    Hypothyroidism    Intestinal metaplasia of gastric mucosa    Osteoarthritis    Osteoporosis    Patellar fracture 07/13/2012   Scapular fracture 03/31/2012   small from direct blow with trip     Subclavian steal  syndrome    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; CTA 2013 revealed open graft   Past Surgical History:  Procedure Laterality Date   APPENDECTOMY     CARDIAC CATHETERIZATION N/A 10/04/2015   Procedure: Right/Left Heart Cath and Coronary Angiography;  Surgeon: Victory LELON Sharps, MD;  Location: [facility] INVASIVE CV LAB;  Service: Cardiovascular;  Laterality: N/A;   CAROTID-SUBCLAVIAN BYPASS GRAFT Left 1999   for Dooly steal syndrome   CHOLECYSTECTOMY     COLONOSCOPY     CYSTECTOMY Left    hand   ELBOW SURGERY Left    KNEE SURGERY Left 2014   MITRAL VALVE REPAIR N/A 10/10/2015   Procedure: MITRAL VALVE REPAIR (MVR) WITH SIZE 28 CARPENTIER-EDWARDS PHYSIO II ANNULOPLASTY RING;  Surgeon: Elspeth JAYSON Millers, MD;  Location: [facility] OR;  Service: Open Heart Surgery;  Laterality: N/A;   ROTATOR CUFF REPAIR Left    TEAR DUCT PROBING  07/2014   TEE WITHOUT CARDIOVERSION N/A 10/03/2015   Procedure: TRANSESOPHAGEAL ECHOCARDIOGRAM (TEE);  Surgeon: Ezra GORMAN Shuck, MD;  Location: [facility] ENDOSCOPY;  Service: Cardiovascular;  Laterality: N/A;   TEE WITHOUT CARDIOVERSION N/A 10/10/2015   Procedure: TRANSESOPHAGEAL ECHOCARDIOGRAM (TEE);  Surgeon: Elspeth JAYSON Millers, MD;  Location: [facility] OR;  Service: Open Heart Surgery;  Laterality: N/A;   TONSILLECTOMY     TUBAL LIGATION     Family History  Problem Relation Age of Onset   Hypertension Mother    Stroke Mother    Heart disease Father    Colon cancer Neg Hx    Neurofibromatosis Neg Hx    Allergic rhinitis Neg Hx    Asthma Neg Hx    Angioedema Neg Hx    Atopy Neg Hx    Eczema Neg Hx    Immunodeficiency Neg Hx    Urticaria Neg Hx    Social History   Socioeconomic History   Marital status: Widowed    Spouse name: Not on file   Number of children: 4   Years of education: Not on file   Highest education level: Bachelor's degree (e.g., BA, AB, BS)  Occupational History   Occupation: retired runner, broadcasting/film/video  Tobacco Use   Smoking status: Former    Current  packs/day: 0.00    Average packs/day: 0.8 packs/day for 20.0 years (15.0 ttl pk-yrs)    Types: Cigarettes    Start date: 01/05/1971    Quit date: 01/05/1991    Years since quitting: 33.0   Smokeless tobacco: Never  Vaping Use   Vaping status: Never Used  Substance and Sexual Activity   Alcohol  use: Not Currently    Alcohol /week: 4.0 standard drinks of alcohol     Types: 4 Glasses of wine  per week    Comment: socially   Drug use: No   Sexual activity: Not on file  Other Topics Concern   Not on file  Social History Narrative   Widowed.  Lives alone in a one story home.  Has 4 children (3 living).  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    HH of 1    No pets   Former smoker   Exercises regularly   Right handed   Social Drivers of Corporate Investment Banker Strain: Low Risk  (01/04/2024)   Overall Financial Resource Strain (CARDIA)    Difficulty of Paying Living Expenses: Not hard at all  Food Insecurity: No Food Insecurity (01/04/2024)   Hunger Vital Sign    Worried About Running Out of Food in the Last Year: Never true    Ran Out of Food in the Last Year: Never true  Transportation Needs: No Transportation Needs (01/04/2024)   PRAPARE - Administrator, Civil Service (Medical): No    Lack of Transportation (Non-Medical): No  Physical Activity: Insufficiently Active (01/04/2024)   Exercise Vital Sign    Days of Exercise per Week: 2 days    Minutes of Exercise per Session: 60 min  Stress: No Stress Concern Present (01/04/2024)   [employer] of Occupational Health - Occupational Stress Questionnaire    Feeling of Stress : Not at all  Social Connections: Moderately Integrated (09/03/2021)   Social Connection and Isolation Panel [NHANES]    Frequency of Communication with Friends and Family: More than three times a week    Frequency of Social Gatherings with Friends and Family: Three times a week    Attends Religious Services: More than 4 times per year    Active Member of Clubs or  Organizations: Yes    Attends Banker Meetings: 1 to 4 times per year    Marital Status: Widowed    Tobacco Counseling Counseling given: Not Answered   Clinical Intake:  Pre-visit preparation completed: Yes  Pain : No/denies pain     BMI - recorded: 19.27 Nutritional Status: BMI of 19-24  Normal Nutritional Risks: None Diabetes: No  How often do you need to have someone help you when you read instructions, pamphlets, or other written materials from your doctor or pharmacy?: 1 - Never  Interpreter Needed?: No  Information entered by :: Rojelio Blush LPN   Activities of Daily Living    01/04/2024    4:21 PM  In your present state of health, do you have any difficulty performing the following activities:  Hearing? 0  Vision? 0  Difficulty concentrating or making decisions? 0  Walking or climbing stairs? 1  Comment Uses a Agricultural Consultant or bathing? 0  Doing errands, shopping? Heritage Manager and eating ? N  Using the Toilet? N  In the past six months, have you accidently leaked urine? N  Do you have problems with loss of bowel control? N  Managing your Medications? N  Managing your Finances? N  Housekeeping or managing your Housekeeping? N    Patient Care Team: Panosh, [name], MD as PCP - General (Internal Medicine) Pietro Redell RAMAN, MD as PCP - Cardiology (Cardiology) Austin Ned, MD (Obstetrics and Gynecology) Ernie Cough, MD (Orthopedic Surgery) Tommas Pears, MD as Attending Physician (Endocrinology) Neysa Fallow, MD as Consulting Physician (Ophthalmology) Pietro Redell RAMAN, MD as Consulting Physician (Cardiology) Kerrin Elspeth BROCKS, MD as Consulting Physician (Cardiothoracic Surgery) benetta Blanch, [facility]  K, DO as Consulting Physician (Neurology) Patel, Donika K, DO as Consulting Physician (Neurology) Lionell Jon DEL, [facility] (Pharmacist)  Indicate any recent Medical Services you may have received from other than [facility]  providers in the past year (date may be approximate).     Assessment:   This is a routine wellness examination for Lucresia.  Hearing/Vision screen Hearing Screening - Comments:: Denies hearing difficulties   Vision Screening - Comments:: Wears rx glasses - Not up to date with routine eye exams with  Dr Roz. Referred to [facility]   Goals Addressed               This Visit's Progress     Increase physical activity (pt-stated)         Depression Screen    01/04/2024    4:20 PM 09/09/2023    2:17 PM 03/18/2023    3:35 PM 02/10/2023    2:49 PM 09/16/2022    3:29 PM 07/15/2022    1:29 PM 12/25/2021   10:51 AM  PHQ 2/9 Scores  PHQ - 2 Score 0 0 2 0 0 0 1  PHQ- 9 Score  4 3 0  4 2    Fall Risk    01/04/2024    4:22 PM 09/09/2023    2:16 PM 03/18/2023    3:34 PM 02/10/2023    2:48 PM 09/16/2022    3:27 PM  Fall Risk   Falls in the past year? 1 1 1 1 1   Comment     lost balance  Number falls in past yr: 0 1 0 0 1  Injury with Fall? 1 0 1 1 0  Comment Fx; Rt Thumb. Followed b medical attention.      Risk for fall due to : No Fall Risks Other (Comment) History of fall(s) Other (Comment) Impaired balance/gait;Impaired mobility;Medication side effect  Follow up Falls prevention discussed Falls evaluation completed Falls evaluation completed Falls evaluation completed Falls evaluation completed;Education provided;Falls prevention discussed    MEDICARE RISK AT HOME: Medicare Risk at Home Any stairs in or around the home?: No If so, are there any without handrails?: No Home free of loose throw rugs in walkways, pet beds, electrical cords, etc?: Yes Adequate lighting in your home to reduce risk of falls?: Yes Life alert?: No Use of a cane, walker or w/c?: Yes Grab bars in the bathroom?: Yes Shower chair or bench in shower?: Yes Elevated toilet seat or a handicapped toilet?: Yes  TIMED UP AND GO:  Was the test performed?  No    Cognitive Function:     08/08/2016   10:00 AM  MMSE - Mini Mental State Exam  Not completed: --        01/04/2024    4:25 PM 09/16/2022    3:31 PM 09/03/2021    3:33 PM 08/29/2020    3:26 PM  6CIT Screen  What Year? 0 points 0 points 0 points 0 points  What month? 0 points 0 points 0 points 0 points  What time? 0 points 0 points 0 points   Count back from 20 0 points 0 points 0 points 0 points  Months in reverse 0 points 0 points 0 points 0 points  Repeat phrase 0 points 0 points 0 points 0 points  Total Score 0 points 0 points 0 points     Immunizations Immunization History  Administered Date(s) Administered   Fluad Trivalent(High Dose 65+) 09/09/2023   Influenza Split 10/20/2013  Influenza Whole 12/29/2001, 11/09/2007, 09/19/2008, 09/24/2010   Influenza, High Dose Seasonal PF 10/31/2015, 10/06/2016, 09/29/2017, 09/30/2018   Influenza-Unspecified 09/28/2014, 10/02/2020   PFIZER(Purple Top)SARS-COV-2 Vaccination 01/22/2020, 02/13/2020, 10/19/2020   Pneumococcal Conjugate-13 07/26/2014   Pneumococcal Polysaccharide-23 09/18/2009   Td 04/09/1999   Tdap 09/23/2011   Zoster Recombinant(Shingrix) 09/17/2018, 02/02/2019   Zoster, Live 12/29/2010    TDAP status: Due, Education has been provided regarding the importance of this vaccine. Advised may receive this vaccine at local pharmacy or Health Dept. Aware to provide a copy of the vaccination record if obtained from local pharmacy or Health Dept. Verbalized acceptance and understanding.  Flu Vaccine status: Up to date  Pneumococcal vaccine status: Up to date  Covid-19 vaccine status: Declined, Education has been provided regarding the importance of this vaccine but patient still declined. Advised may receive this vaccine at local pharmacy or Health Dept.or vaccine clinic. Aware to provide a copy of the vaccination record if obtained from local pharmacy or Health Dept. Verbalized acceptance and understanding.  Qualifies for Shingles Vaccine? Yes   Zostavax  completed Yes   Shingrix Completed?: Yes  Screening Tests Health Maintenance  Topic Date Due   DTaP/Tdap/Td (3 - Td or Tdap) 09/22/2021   COVID-19 Vaccine (4 - 2024-25 season) 08/30/2023   Medicare Annual Wellness (AWV)  01/03/2025   Pneumonia Vaccine 28+ Years old  Completed   INFLUENZA VACCINE  Completed   DEXA SCAN  Completed   Zoster Vaccines- Shingrix  Completed   HPV VACCINES  Aged Out    Health Maintenance  Health Maintenance Due  Topic Date Due   DTaP/Tdap/Td (3 - Td or Tdap) 09/22/2021   COVID-19 Vaccine (4 - 2024-25 season) 08/30/2023         Additional Screening:   Vision Screening: Recommended annual ophthalmology exams for early detection of glaucoma and other disorders of the eye. Is the patient up to date with their annual eye exam?  Yes  Who is the provider or what is the name of the office in which the patient attends annual eye exams? Dr Roz If pt is not established with a provider, would they like to be referred to a provider to establish care? Yes .   Dental Screening: Recommended annual dental exams for proper oral hygiene    Community Resource Referral / Chronic Care Management:  CRR required this visit?  No   CCM required this visit?  No     Plan:     I have personally reviewed and noted the following in the patient's chart:   Medical and social history Use of alcohol , tobacco or illicit drugs  Current medications and supplements including opioid prescriptions. Patient is not currently taking opioid prescriptions. Functional ability and status Nutritional status Physical activity Advanced directives List of other physicians Hospitalizations, surgeries, and ER visits in previous 12 months Vitals Screenings to include cognitive, depression, and falls Referrals and appointments  In addition, I have reviewed and discussed with patient certain preventive protocols, quality metrics, and best practice recommendations. A written  personalized care plan for preventive services as well as general preventive health recommendations were provided to patient.     Rojelio LELON Blush, LPN   07/31/7973   After Visit Summary: (MyChart) Due to this being a telephonic visit, the after visit summary with patients personalized plan was offered to patient via MyChart   Nurse Notes: None

## 2024-01-07 DIAGNOSIS — R278 Other lack of coordination: Secondary | ICD-10-CM | POA: Diagnosis not present

## 2024-01-07 DIAGNOSIS — G629 Polyneuropathy, unspecified: Secondary | ICD-10-CM | POA: Diagnosis not present

## 2024-01-07 DIAGNOSIS — Z9181 History of falling: Secondary | ICD-10-CM | POA: Diagnosis not present

## 2024-01-07 DIAGNOSIS — M1990 Unspecified osteoarthritis, unspecified site: Secondary | ICD-10-CM | POA: Diagnosis not present

## 2024-01-12 DIAGNOSIS — G629 Polyneuropathy, unspecified: Secondary | ICD-10-CM | POA: Diagnosis not present

## 2024-01-12 DIAGNOSIS — M1990 Unspecified osteoarthritis, unspecified site: Secondary | ICD-10-CM | POA: Diagnosis not present

## 2024-01-12 DIAGNOSIS — Z9181 History of falling: Secondary | ICD-10-CM | POA: Diagnosis not present

## 2024-01-12 DIAGNOSIS — R278 Other lack of coordination: Secondary | ICD-10-CM | POA: Diagnosis not present

## 2024-01-14 DIAGNOSIS — G629 Polyneuropathy, unspecified: Secondary | ICD-10-CM | POA: Diagnosis not present

## 2024-01-14 DIAGNOSIS — R278 Other lack of coordination: Secondary | ICD-10-CM | POA: Diagnosis not present

## 2024-01-14 DIAGNOSIS — M1990 Unspecified osteoarthritis, unspecified site: Secondary | ICD-10-CM | POA: Diagnosis not present

## 2024-01-14 DIAGNOSIS — Z9181 History of falling: Secondary | ICD-10-CM | POA: Diagnosis not present

## 2024-01-19 DIAGNOSIS — G629 Polyneuropathy, unspecified: Secondary | ICD-10-CM | POA: Diagnosis not present

## 2024-01-19 DIAGNOSIS — M1990 Unspecified osteoarthritis, unspecified site: Secondary | ICD-10-CM | POA: Diagnosis not present

## 2024-01-19 DIAGNOSIS — R278 Other lack of coordination: Secondary | ICD-10-CM | POA: Diagnosis not present

## 2024-01-19 DIAGNOSIS — Z9181 History of falling: Secondary | ICD-10-CM | POA: Diagnosis not present

## 2024-01-21 DIAGNOSIS — G629 Polyneuropathy, unspecified: Secondary | ICD-10-CM | POA: Diagnosis not present

## 2024-01-21 DIAGNOSIS — R278 Other lack of coordination: Secondary | ICD-10-CM | POA: Diagnosis not present

## 2024-01-21 DIAGNOSIS — M1990 Unspecified osteoarthritis, unspecified site: Secondary | ICD-10-CM | POA: Diagnosis not present

## 2024-01-21 DIAGNOSIS — Z9181 History of falling: Secondary | ICD-10-CM | POA: Diagnosis not present

## 2024-01-26 DIAGNOSIS — R278 Other lack of coordination: Secondary | ICD-10-CM | POA: Diagnosis not present

## 2024-01-26 DIAGNOSIS — M1990 Unspecified osteoarthritis, unspecified site: Secondary | ICD-10-CM | POA: Diagnosis not present

## 2024-01-26 DIAGNOSIS — G629 Polyneuropathy, unspecified: Secondary | ICD-10-CM | POA: Diagnosis not present

## 2024-01-26 DIAGNOSIS — Z9181 History of falling: Secondary | ICD-10-CM | POA: Diagnosis not present

## 2024-01-28 DIAGNOSIS — Z9181 History of falling: Secondary | ICD-10-CM | POA: Diagnosis not present

## 2024-01-28 DIAGNOSIS — M1990 Unspecified osteoarthritis, unspecified site: Secondary | ICD-10-CM | POA: Diagnosis not present

## 2024-01-28 DIAGNOSIS — R278 Other lack of coordination: Secondary | ICD-10-CM | POA: Diagnosis not present

## 2024-01-28 DIAGNOSIS — G629 Polyneuropathy, unspecified: Secondary | ICD-10-CM | POA: Diagnosis not present

## 2024-02-02 DIAGNOSIS — R278 Other lack of coordination: Secondary | ICD-10-CM | POA: Diagnosis not present

## 2024-02-02 DIAGNOSIS — M1990 Unspecified osteoarthritis, unspecified site: Secondary | ICD-10-CM | POA: Diagnosis not present

## 2024-02-02 DIAGNOSIS — Z9181 History of falling: Secondary | ICD-10-CM | POA: Diagnosis not present

## 2024-02-02 DIAGNOSIS — G629 Polyneuropathy, unspecified: Secondary | ICD-10-CM | POA: Diagnosis not present

## 2024-02-04 DIAGNOSIS — M1990 Unspecified osteoarthritis, unspecified site: Secondary | ICD-10-CM | POA: Diagnosis not present

## 2024-02-04 DIAGNOSIS — R278 Other lack of coordination: Secondary | ICD-10-CM | POA: Diagnosis not present

## 2024-02-04 DIAGNOSIS — Z9181 History of falling: Secondary | ICD-10-CM | POA: Diagnosis not present

## 2024-02-04 DIAGNOSIS — G629 Polyneuropathy, unspecified: Secondary | ICD-10-CM | POA: Diagnosis not present

## 2024-02-09 DIAGNOSIS — Z9181 History of falling: Secondary | ICD-10-CM | POA: Diagnosis not present

## 2024-02-09 DIAGNOSIS — G629 Polyneuropathy, unspecified: Secondary | ICD-10-CM | POA: Diagnosis not present

## 2024-02-09 DIAGNOSIS — R278 Other lack of coordination: Secondary | ICD-10-CM | POA: Diagnosis not present

## 2024-02-09 DIAGNOSIS — M1990 Unspecified osteoarthritis, unspecified site: Secondary | ICD-10-CM | POA: Diagnosis not present

## 2024-02-11 DIAGNOSIS — G629 Polyneuropathy, unspecified: Secondary | ICD-10-CM | POA: Diagnosis not present

## 2024-02-11 DIAGNOSIS — R278 Other lack of coordination: Secondary | ICD-10-CM | POA: Diagnosis not present

## 2024-02-11 DIAGNOSIS — M1990 Unspecified osteoarthritis, unspecified site: Secondary | ICD-10-CM | POA: Diagnosis not present

## 2024-02-11 DIAGNOSIS — Z9181 History of falling: Secondary | ICD-10-CM | POA: Diagnosis not present

## 2024-02-16 DIAGNOSIS — M1990 Unspecified osteoarthritis, unspecified site: Secondary | ICD-10-CM | POA: Diagnosis not present

## 2024-02-16 DIAGNOSIS — R278 Other lack of coordination: Secondary | ICD-10-CM | POA: Diagnosis not present

## 2024-02-16 DIAGNOSIS — Z9181 History of falling: Secondary | ICD-10-CM | POA: Diagnosis not present

## 2024-02-16 DIAGNOSIS — G629 Polyneuropathy, unspecified: Secondary | ICD-10-CM | POA: Diagnosis not present

## 2024-02-23 DIAGNOSIS — Z9181 History of falling: Secondary | ICD-10-CM | POA: Diagnosis not present

## 2024-02-23 DIAGNOSIS — R278 Other lack of coordination: Secondary | ICD-10-CM | POA: Diagnosis not present

## 2024-02-23 DIAGNOSIS — G629 Polyneuropathy, unspecified: Secondary | ICD-10-CM | POA: Diagnosis not present

## 2024-02-23 DIAGNOSIS — M1990 Unspecified osteoarthritis, unspecified site: Secondary | ICD-10-CM | POA: Diagnosis not present

## 2024-02-25 DIAGNOSIS — Z9181 History of falling: Secondary | ICD-10-CM | POA: Diagnosis not present

## 2024-02-25 DIAGNOSIS — M1990 Unspecified osteoarthritis, unspecified site: Secondary | ICD-10-CM | POA: Diagnosis not present

## 2024-02-25 DIAGNOSIS — G629 Polyneuropathy, unspecified: Secondary | ICD-10-CM | POA: Diagnosis not present

## 2024-02-25 DIAGNOSIS — R278 Other lack of coordination: Secondary | ICD-10-CM | POA: Diagnosis not present

## 2024-02-29 ENCOUNTER — Telehealth: Payer: Self-pay

## 2024-02-29 NOTE — Telephone Encounter (Signed)
 Noted. Thank you!  Forward to provider for [facility]

## 2024-02-29 NOTE — Telephone Encounter (Signed)
 Pt ready for scheduling on or after 03/08/24. (Has an appt 3/12 - can get during appt)  Estimated out-of-pocket cost due at time of visit: $0  Primary Insurance:Medicare Deductible: $257 out of $257.  This summary of benefits is an estimation of the patient's out-of-pocket cost. Exact cost may vary based on individual plan coverage.

## 2024-03-01 DIAGNOSIS — Z9181 History of falling: Secondary | ICD-10-CM | POA: Diagnosis not present

## 2024-03-01 DIAGNOSIS — M1990 Unspecified osteoarthritis, unspecified site: Secondary | ICD-10-CM | POA: Diagnosis not present

## 2024-03-01 DIAGNOSIS — R278 Other lack of coordination: Secondary | ICD-10-CM | POA: Diagnosis not present

## 2024-03-01 DIAGNOSIS — G629 Polyneuropathy, unspecified: Secondary | ICD-10-CM | POA: Diagnosis not present

## 2024-03-08 DIAGNOSIS — Z9181 History of falling: Secondary | ICD-10-CM | POA: Diagnosis not present

## 2024-03-08 DIAGNOSIS — G629 Polyneuropathy, unspecified: Secondary | ICD-10-CM | POA: Diagnosis not present

## 2024-03-08 DIAGNOSIS — R278 Other lack of coordination: Secondary | ICD-10-CM | POA: Diagnosis not present

## 2024-03-08 DIAGNOSIS — M1990 Unspecified osteoarthritis, unspecified site: Secondary | ICD-10-CM | POA: Diagnosis not present

## 2024-03-08 NOTE — Progress Notes (Signed)
 Chief Complaint  Patient presents with   Medical Management of Chronic Issues    Pt is here for follow up. Pt has concerns about her weight. She states she keep losing weight.     HPI: Amy Fischer 89 y.o. come in for Chronic disease management  osteoporosis  and due for prolia injection   Weight still a problem but says  eats  3 meals per  day.   Self prepare. Does own shopping   Sometimes    Water "not a lot. "  But never did  Has ensure. 3-4 per week. HLD ht under cards care  UNder care for a fib and anticoagulation. No new falling .  Thyroid no change meds? ROS: See pertinent positives and negatives per HPI. Using  walker for safety   Past Medical History:  Diagnosis Date   Allergy    Anemia    Asymptomatic carotid artery stenosis    R ICA 40% stenosis on CTA 12/2011.   Bladder polyps    Cataract    BILATERAL-REMOVED   CHF (congestive heart failure) (HCC)    Diverticulosis    Elevated blood pressure 03/25/2011   Bp readings borderline today has hx of elvation  In office and ok at home   Not checke recently   She gfeels was elevated from anxiety     Episodic recurrent vertigo    MRI Head 2003   Esophageal spasm    GERD (gastroesophageal reflux disease)    Hepatic hemangioma    History of hepatitis    unknown type   Hyperlipidemia    Hyperplastic colon polyp    Hypothyroidism    Intestinal metaplasia of gastric mucosa    Osteoarthritis    Osteoporosis    Patellar fracture 07/13/2012   Scapular fracture 03/31/2012   small from direct blow with trip     Subclavian steal syndrome    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; CTA 2013 revealed open graft    Family History  Problem Relation Age of Onset   Hypertension Mother    Stroke Mother    Heart disease Father    Colon cancer Neg Hx    Neurofibromatosis Neg Hx    Allergic rhinitis Neg Hx    Asthma Neg Hx    Angioedema Neg Hx    Atopy Neg Hx    Eczema Neg Hx    Immunodeficiency Neg Hx     Urticaria Neg Hx     Social History   Socioeconomic History   Marital status: Widowed    Spouse name: Not on file   Number of children: 4   Years of education: Not on file   Highest education level: Bachelor's degree (e.g., BA, AB, BS)  Occupational History   Occupation: retired Runner, broadcasting/film/video  Tobacco Use   Smoking status: Former    Current packs/day: 0.00    Average packs/day: 0.8 packs/day for 20.0 years (15.0 ttl pk-yrs)    Types: Cigarettes    Start date: 01/05/1971    Quit date: 01/05/1991    Years since quitting: 33.2   Smokeless tobacco: Never  Vaping Use   Vaping status: Never Used  Substance and Sexual Activity   Alcohol use: Not Currently    Alcohol/week: 4.0 standard drinks of alcohol    Types: 4 Glasses of wine per week    Comment: socially   Drug use: No   Sexual activity: Not on file  Other Topics Concern   Not on  file  Social History Narrative   Widowed.  Lives alone in a one story home.  Has 4 children (3 living).  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    HH of 1    No pets   Former smoker   Exercises regularly   Right handed   Social Drivers of Corporate investment banker Strain: Low Risk  (01/04/2024)   Overall Financial Resource Strain (CARDIA)    Difficulty of Paying Living Expenses: Not hard at all  Food Insecurity: No Food Insecurity (01/04/2024)   Hunger Vital Sign    Worried About Running Out of Food in the Last Year: Never true    Ran Out of Food in the Last Year: Never true  Transportation Needs: No Transportation Needs (01/04/2024)   PRAPARE - Administrator, Civil Service (Medical): No    Lack of Transportation (Non-Medical): No  Physical Activity: Insufficiently Active (01/04/2024)   Exercise Vital Sign    Days of Exercise per Week: 2 days    Minutes of Exercise per Session: 60 min  Stress: No Stress Concern Present (01/04/2024)   [employer] of Occupational Health - Occupational Stress Questionnaire    Feeling of Stress : Not at all   Social Connections: Moderately Integrated (09/03/2021)   Social Connection and Isolation Panel [NHANES]    Frequency of Communication with Friends and Family: More than three times a week    Frequency of Social Gatherings with Friends and Family: Three times a week    Attends Religious Services: More than 4 times per year    Active Member of Clubs or Organizations: Yes    Attends Banker Meetings: 1 to 4 times per year    Marital Status: Widowed    Outpatient Medications Prior to Visit  Medication Sig Dispense Refill   albuterol (VENTOLIN HFA) 108 (90 Base) MCG/ACT inhaler Inhale 2 puffs into the lungs every 6 (six) hours as needed for wheezing or shortness of breath. 18 g 2   apixaban (ELIQUIS) 2.5 MG TABS tablet TAKE 1 TABLET TWICE A DAY 180 tablet 1   cholecalciferol (VITAMIN D) 1000 units tablet Take 1,000 Units by mouth daily.     ezetimibe (ZETIA) 10 MG tablet TAKE 1 TABLET DAILY 90 tablet 3   folic acid (FOLVITE) 1 MG tablet TAKE 1 TABLET DAILY 90 tablet 3   furosemide (LASIX) 20 MG tablet Take one tablet as needed for shortness of breathe and leg edema 20 tablet 0   ipratropium (ATROVENT) 0.06 % nasal spray USE 1 SPRAY IN EACH NOSTRIL TWICE A DAY AS NEEDED FOR RHINITIS 45 mL 3   levothyroxine (SYNTHROID) 100 MCG tablet TAKE 1 TABLET EVERY MORNING ON AN EMPTY STOMACH 90 tablet 3   Melatonin 3 MG TABS Take 3 mg by mouth at bedtime.      Polyethyl Glycol-Propyl Glycol 0.4-0.3 % SOLN Place 1 drop into both eyes 2 (two) times daily.     rosuvastatin (CRESTOR) 5 MG tablet Take 0.5 tablets (2.5 mg total) by mouth daily. 90 tablet 3   SALINE NASAL SPRAY NA Place 1 spray into both nostrils in the morning and at bedtime.     vitamin B-12 (CYANOCOBALAMIN) 1000 MCG tablet Take 1,000 mcg by mouth daily.     No facility-administered medications prior to visit.     EXAM:  BP 138/80 (BP Location: Left Arm, Patient Position: Sitting, Cuff Size: Normal)   Pulse 93   Temp (!) 97.4  F (36.3 C) (  Oral)   Ht 5\' 1"  (1.549 m)   Wt 98 lb 12.8 oz (44.8 kg)   SpO2 96%   BMI 18.67 kg/m   Body mass index is 18.67 kg/m. Wt Readings from Last 3 Encounters:  03/09/24 98 lb 12.8 oz (44.8 kg)  01/04/24 102 lb (46.3 kg)  11/13/23 104 lb (47.2 kg)    GENERAL: vitals reviewed and listed above, alert, oriented, appears well hydrated and in no acute distress cognition appears  normal  HEENT: atraumatic, conjunctiva  clear, no obvious abnormalities on inspection of external nose and ears  NECK: no obvious masses on inspection palpation  LUNGS: clear to auscultation bilaterally, no wheezes, rales or rhonchi, good air movement CV: HRRR, no clubbing cyanosis or  peripheral edema nl cap refill  MS: moves all extremities without noticeable focal  abnormality  arthritis changes  not acute  Gait with walker  independent  PSYCH: pleasant and cooperative, no obvious depression or anxiety Lab Results  Component Value Date   WBC 5.9 03/09/2024   HGB 14.1 03/09/2024   HCT 41.6 03/09/2024   PLT 174.0 03/09/2024   GLUCOSE 96 03/09/2024   CHOL 143 11/04/2022   TRIG 83.0 11/04/2022   HDL 60.60 11/04/2022   LDLDIRECT 76.0 08/19/2019   LDLCALC 65 11/04/2022   ALT 14 11/13/2023   AST 19 11/13/2023   NA 141 03/09/2024   K 4.0 03/09/2024   CL 105 03/09/2024   CREATININE 0.79 03/09/2024   BUN 22 03/09/2024   CO2 29 03/09/2024   TSH 0.28 (L) 03/09/2024   INR 1.0 09/19/2022   HGBA1C 5.1 10/14/2016   BP Readings from Last 3 Encounters:  03/09/24 138/80  11/13/23 (!) 143/78  10/19/23 126/76   Lab Results  Component Value Date   VITAMINB12 476 03/09/2024     ASSESSMENT AND PLAN:  Discussed the following assessment and plan:  Age related osteoporosis, unspecified pathological fracture presence - Plan: denosumab (PROLIA) injection 60 mg, TSH, T4, free, T3, free, Vitamin B12, Basic metabolic panel, CBC with Differential/Platelet  Underweight  Hypothyroidism, unspecified type -  Plan: TSH, T4, free, T3, free, Vitamin B12, Basic metabolic panel, CBC with Differential/Platelet  Weight loss - Plan: TSH, T4, free, T3, free, Vitamin B12, Basic metabolic panel, CBC with Differential/Platelet  PAF (paroxysmal atrial fibrillation) (HCC)  Anticoagulant long-term use - Plan: TSH, T4, free, T3, free, Vitamin B12  Medication management - Plan: denosumab (PROLIA) injection 60 mg, TSH, T4, free, T3, free, Vitamin B12, Basic metabolic panel, CBC with Differential/Platelet  Advanced age Plan update  metabolic thyroid  in cases  adding to sarcopenia .  -Patient advised to return or notify health care team  if  new concerns arise.  Patient Instructions   Need to keep  up with  good protein intake  to help muslce strength and avoid weight loss.  Add a protein shake or ensure EVERY DAY for now and continue PT.  Checking thryoid also to be sure  not too much replacement  meds.   Trak what  you are eating for 1-2 weeks  We may have you see a deitician also  if needed Come back in 3 months and we can see what weight and how you are doing .     Neta Mends. Amy Fischer M.D.

## 2024-03-09 ENCOUNTER — Ambulatory Visit: Payer: Medicare Other | Admitting: Internal Medicine

## 2024-03-09 ENCOUNTER — Encounter: Payer: Self-pay | Admitting: Internal Medicine

## 2024-03-09 ENCOUNTER — Telehealth: Payer: Self-pay | Admitting: Internal Medicine

## 2024-03-09 VITALS — BP 138/80 | HR 93 | Temp 97.4°F | Ht 61.0 in | Wt 98.8 lb

## 2024-03-09 DIAGNOSIS — R54 Age-related physical debility: Secondary | ICD-10-CM

## 2024-03-09 DIAGNOSIS — M810 Age-related osteoporosis without current pathological fracture: Secondary | ICD-10-CM

## 2024-03-09 DIAGNOSIS — E039 Hypothyroidism, unspecified: Secondary | ICD-10-CM | POA: Diagnosis not present

## 2024-03-09 DIAGNOSIS — Z79899 Other long term (current) drug therapy: Secondary | ICD-10-CM

## 2024-03-09 DIAGNOSIS — R634 Abnormal weight loss: Secondary | ICD-10-CM | POA: Diagnosis not present

## 2024-03-09 DIAGNOSIS — R636 Underweight: Secondary | ICD-10-CM

## 2024-03-09 DIAGNOSIS — I480 Paroxysmal atrial fibrillation: Secondary | ICD-10-CM | POA: Diagnosis not present

## 2024-03-09 DIAGNOSIS — Z7901 Long term (current) use of anticoagulants: Secondary | ICD-10-CM | POA: Diagnosis not present

## 2024-03-09 LAB — CBC WITH DIFFERENTIAL/PLATELET
Basophils Absolute: 0 10*3/uL (ref 0.0–0.1)
Basophils Relative: 0.4 % (ref 0.0–3.0)
Eosinophils Absolute: 0 10*3/uL (ref 0.0–0.7)
Eosinophils Relative: 0.7 % (ref 0.0–5.0)
HCT: 41.6 % (ref 36.0–46.0)
Hemoglobin: 14.1 g/dL (ref 12.0–15.0)
Lymphocytes Relative: 19.1 % (ref 12.0–46.0)
Lymphs Abs: 1.1 10*3/uL (ref 0.7–4.0)
MCHC: 33.8 g/dL (ref 30.0–36.0)
MCV: 97.5 fl (ref 78.0–100.0)
Monocytes Absolute: 0.6 10*3/uL (ref 0.1–1.0)
Monocytes Relative: 9.3 % (ref 3.0–12.0)
Neutro Abs: 4.2 10*3/uL (ref 1.4–7.7)
Neutrophils Relative %: 70.5 % (ref 43.0–77.0)
Platelets: 174 10*3/uL (ref 150.0–400.0)
RBC: 4.27 Mil/uL (ref 3.87–5.11)
RDW: 14.1 % (ref 11.5–15.5)
WBC: 5.9 10*3/uL (ref 4.0–10.5)

## 2024-03-09 LAB — BASIC METABOLIC PANEL
BUN: 22 mg/dL (ref 6–23)
CO2: 29 meq/L (ref 19–32)
Calcium: 10.3 mg/dL (ref 8.4–10.5)
Chloride: 105 meq/L (ref 96–112)
Creatinine, Ser: 0.79 mg/dL (ref 0.40–1.20)
GFR: 64.7 mL/min (ref >60.00)
Glucose, Bld: 96 mg/dL (ref 70–99)
Potassium: 4 meq/L (ref 3.5–5.1)
Sodium: 141 meq/L (ref 135–145)

## 2024-03-09 LAB — TSH: TSH: 0.28 u[IU]/mL — ABNORMAL LOW (ref 0.35–5.50)

## 2024-03-09 LAB — T3, FREE: T3, Free: 2.8 pg/mL (ref 2.3–4.2)

## 2024-03-09 LAB — VITAMIN B12: Vitamin B-12: 476 pg/mL (ref 211–911)

## 2024-03-09 LAB — T4, FREE: Free T4: 1.24 ng/dL (ref 0.60–1.60)

## 2024-03-09 MED ORDER — DENOSUMAB 60 MG/ML ~~LOC~~ SOSY
60.0000 mg | PREFILLED_SYRINGE | Freq: Once | SUBCUTANEOUS | Status: AC
  Administered 2024-03-09: 60 mg via SUBCUTANEOUS

## 2024-03-09 NOTE — Patient Instructions (Addendum)
  Need to keep  up with  good protein intake  to help muslce strength and avoid weight loss.  Add a protein shake or ensure EVERY DAY for now and continue PT.  Checking thryoid also to be sure  not too much replacement  meds.   Trak what  you are eating for 1-2 weeks  We may have you see a deitician also  if needed Come back in 3 months and we can see what weight and how you are doing .

## 2024-03-09 NOTE — Telephone Encounter (Signed)
 Patient would like to know if Dr Fabian Sharp knows of any good eye doctors?  She would like one that can see her because of her double vision, one that can do anything.  Please advise pt at [phone]

## 2024-03-09 NOTE — Progress Notes (Signed)
 Thyroid test shows too high dose thyroid medication for now .  Decrease thyroid medication change to   take   5 days per week  pick 2 days where you do not take this medication and record in chart .  Plan to repeat tsh before a next visit in June do not have to fast and if too  hard we can get the  lab at the visit .  Please place orderfor future tsh and update  sig on thyroid medication

## 2024-03-10 DIAGNOSIS — M1990 Unspecified osteoarthritis, unspecified site: Secondary | ICD-10-CM | POA: Diagnosis not present

## 2024-03-10 DIAGNOSIS — G629 Polyneuropathy, unspecified: Secondary | ICD-10-CM | POA: Diagnosis not present

## 2024-03-10 DIAGNOSIS — R278 Other lack of coordination: Secondary | ICD-10-CM | POA: Diagnosis not present

## 2024-03-10 DIAGNOSIS — Z9181 History of falling: Secondary | ICD-10-CM | POA: Diagnosis not present

## 2024-03-15 DIAGNOSIS — M1990 Unspecified osteoarthritis, unspecified site: Secondary | ICD-10-CM | POA: Diagnosis not present

## 2024-03-15 DIAGNOSIS — Z9181 History of falling: Secondary | ICD-10-CM | POA: Diagnosis not present

## 2024-03-15 DIAGNOSIS — G629 Polyneuropathy, unspecified: Secondary | ICD-10-CM | POA: Diagnosis not present

## 2024-03-15 DIAGNOSIS — R278 Other lack of coordination: Secondary | ICD-10-CM | POA: Diagnosis not present

## 2024-03-16 ENCOUNTER — Telehealth: Payer: Self-pay | Admitting: *Deleted

## 2024-03-16 ENCOUNTER — Other Ambulatory Visit: Payer: Self-pay | Admitting: *Deleted

## 2024-03-16 DIAGNOSIS — H532 Diplopia: Secondary | ICD-10-CM

## 2024-03-16 DIAGNOSIS — E039 Hypothyroidism, unspecified: Secondary | ICD-10-CM

## 2024-03-16 NOTE — Telephone Encounter (Signed)
 Yes  please do referral to ophthalmology ( but double visioncan also be a neurologic problem )

## 2024-03-16 NOTE — Telephone Encounter (Signed)
 Patient would like a referral to an opthalmology due to double vision.  Okay to place?

## 2024-03-16 NOTE — Telephone Encounter (Signed)
 See updated note  sorry about the delay   I was out last week.  Yes approved referral  (please send if not already sent  )but   some types of double  vision can be a neurologic problem and is never normal. vision.

## 2024-03-17 NOTE — Telephone Encounter (Signed)
 Referral was placed.   Attempted to reach pt. Left a detail message that referral was placed and someone should contact her soon. If have any questions, to call us back.

## 2024-03-17 NOTE — Telephone Encounter (Signed)
 Referral placed.

## 2024-03-22 DIAGNOSIS — M1990 Unspecified osteoarthritis, unspecified site: Secondary | ICD-10-CM | POA: Diagnosis not present

## 2024-03-22 DIAGNOSIS — Z9181 History of falling: Secondary | ICD-10-CM | POA: Diagnosis not present

## 2024-03-22 DIAGNOSIS — R278 Other lack of coordination: Secondary | ICD-10-CM | POA: Diagnosis not present

## 2024-03-22 DIAGNOSIS — G629 Polyneuropathy, unspecified: Secondary | ICD-10-CM | POA: Diagnosis not present

## 2024-03-24 DIAGNOSIS — G629 Polyneuropathy, unspecified: Secondary | ICD-10-CM | POA: Diagnosis not present

## 2024-03-24 DIAGNOSIS — R278 Other lack of coordination: Secondary | ICD-10-CM | POA: Diagnosis not present

## 2024-03-24 DIAGNOSIS — Z9181 History of falling: Secondary | ICD-10-CM | POA: Diagnosis not present

## 2024-03-24 DIAGNOSIS — M1990 Unspecified osteoarthritis, unspecified site: Secondary | ICD-10-CM | POA: Diagnosis not present

## 2024-03-29 DIAGNOSIS — G629 Polyneuropathy, unspecified: Secondary | ICD-10-CM | POA: Diagnosis not present

## 2024-03-29 DIAGNOSIS — Z9181 History of falling: Secondary | ICD-10-CM | POA: Diagnosis not present

## 2024-03-29 DIAGNOSIS — M1990 Unspecified osteoarthritis, unspecified site: Secondary | ICD-10-CM | POA: Diagnosis not present

## 2024-03-29 DIAGNOSIS — R278 Other lack of coordination: Secondary | ICD-10-CM | POA: Diagnosis not present

## 2024-04-05 DIAGNOSIS — Z9181 History of falling: Secondary | ICD-10-CM | POA: Diagnosis not present

## 2024-04-05 DIAGNOSIS — G629 Polyneuropathy, unspecified: Secondary | ICD-10-CM | POA: Diagnosis not present

## 2024-04-05 DIAGNOSIS — M1990 Unspecified osteoarthritis, unspecified site: Secondary | ICD-10-CM | POA: Diagnosis not present

## 2024-04-05 DIAGNOSIS — R278 Other lack of coordination: Secondary | ICD-10-CM | POA: Diagnosis not present

## 2024-04-07 DIAGNOSIS — Z9181 History of falling: Secondary | ICD-10-CM | POA: Diagnosis not present

## 2024-04-07 DIAGNOSIS — M1990 Unspecified osteoarthritis, unspecified site: Secondary | ICD-10-CM | POA: Diagnosis not present

## 2024-04-07 DIAGNOSIS — G629 Polyneuropathy, unspecified: Secondary | ICD-10-CM | POA: Diagnosis not present

## 2024-04-07 DIAGNOSIS — R278 Other lack of coordination: Secondary | ICD-10-CM | POA: Diagnosis not present

## 2024-04-12 DIAGNOSIS — G629 Polyneuropathy, unspecified: Secondary | ICD-10-CM | POA: Diagnosis not present

## 2024-04-12 DIAGNOSIS — M1990 Unspecified osteoarthritis, unspecified site: Secondary | ICD-10-CM | POA: Diagnosis not present

## 2024-04-12 DIAGNOSIS — R278 Other lack of coordination: Secondary | ICD-10-CM | POA: Diagnosis not present

## 2024-04-12 DIAGNOSIS — Z9181 History of falling: Secondary | ICD-10-CM | POA: Diagnosis not present

## 2024-04-19 DIAGNOSIS — Z9181 History of falling: Secondary | ICD-10-CM | POA: Diagnosis not present

## 2024-04-19 DIAGNOSIS — G629 Polyneuropathy, unspecified: Secondary | ICD-10-CM | POA: Diagnosis not present

## 2024-04-19 DIAGNOSIS — R278 Other lack of coordination: Secondary | ICD-10-CM | POA: Diagnosis not present

## 2024-04-19 DIAGNOSIS — M1990 Unspecified osteoarthritis, unspecified site: Secondary | ICD-10-CM | POA: Diagnosis not present

## 2024-04-21 DIAGNOSIS — G629 Polyneuropathy, unspecified: Secondary | ICD-10-CM | POA: Diagnosis not present

## 2024-04-21 DIAGNOSIS — M1990 Unspecified osteoarthritis, unspecified site: Secondary | ICD-10-CM | POA: Diagnosis not present

## 2024-04-21 DIAGNOSIS — R278 Other lack of coordination: Secondary | ICD-10-CM | POA: Diagnosis not present

## 2024-04-21 DIAGNOSIS — Z9181 History of falling: Secondary | ICD-10-CM | POA: Diagnosis not present

## 2024-04-26 DIAGNOSIS — R278 Other lack of coordination: Secondary | ICD-10-CM | POA: Diagnosis not present

## 2024-04-26 DIAGNOSIS — M1990 Unspecified osteoarthritis, unspecified site: Secondary | ICD-10-CM | POA: Diagnosis not present

## 2024-04-26 DIAGNOSIS — G629 Polyneuropathy, unspecified: Secondary | ICD-10-CM | POA: Diagnosis not present

## 2024-04-26 DIAGNOSIS — Z9181 History of falling: Secondary | ICD-10-CM | POA: Diagnosis not present

## 2024-05-03 DIAGNOSIS — M1990 Unspecified osteoarthritis, unspecified site: Secondary | ICD-10-CM | POA: Diagnosis not present

## 2024-05-03 DIAGNOSIS — Z9181 History of falling: Secondary | ICD-10-CM | POA: Diagnosis not present

## 2024-05-03 DIAGNOSIS — R278 Other lack of coordination: Secondary | ICD-10-CM | POA: Diagnosis not present

## 2024-05-03 DIAGNOSIS — G629 Polyneuropathy, unspecified: Secondary | ICD-10-CM | POA: Diagnosis not present

## 2024-05-05 DIAGNOSIS — M1990 Unspecified osteoarthritis, unspecified site: Secondary | ICD-10-CM | POA: Diagnosis not present

## 2024-05-05 DIAGNOSIS — R278 Other lack of coordination: Secondary | ICD-10-CM | POA: Diagnosis not present

## 2024-05-05 DIAGNOSIS — Z9181 History of falling: Secondary | ICD-10-CM | POA: Diagnosis not present

## 2024-05-05 DIAGNOSIS — G629 Polyneuropathy, unspecified: Secondary | ICD-10-CM | POA: Diagnosis not present

## 2024-05-10 DIAGNOSIS — M1990 Unspecified osteoarthritis, unspecified site: Secondary | ICD-10-CM | POA: Diagnosis not present

## 2024-05-10 DIAGNOSIS — R278 Other lack of coordination: Secondary | ICD-10-CM | POA: Diagnosis not present

## 2024-05-10 DIAGNOSIS — Z9181 History of falling: Secondary | ICD-10-CM | POA: Diagnosis not present

## 2024-05-10 DIAGNOSIS — G629 Polyneuropathy, unspecified: Secondary | ICD-10-CM | POA: Diagnosis not present

## 2024-05-17 DIAGNOSIS — R278 Other lack of coordination: Secondary | ICD-10-CM | POA: Diagnosis not present

## 2024-05-17 DIAGNOSIS — Z9181 History of falling: Secondary | ICD-10-CM | POA: Diagnosis not present

## 2024-05-17 DIAGNOSIS — G629 Polyneuropathy, unspecified: Secondary | ICD-10-CM | POA: Diagnosis not present

## 2024-05-17 DIAGNOSIS — M1990 Unspecified osteoarthritis, unspecified site: Secondary | ICD-10-CM | POA: Diagnosis not present

## 2024-05-19 DIAGNOSIS — G629 Polyneuropathy, unspecified: Secondary | ICD-10-CM | POA: Diagnosis not present

## 2024-05-19 DIAGNOSIS — R278 Other lack of coordination: Secondary | ICD-10-CM | POA: Diagnosis not present

## 2024-05-19 DIAGNOSIS — M1990 Unspecified osteoarthritis, unspecified site: Secondary | ICD-10-CM | POA: Diagnosis not present

## 2024-05-19 DIAGNOSIS — Z9181 History of falling: Secondary | ICD-10-CM | POA: Diagnosis not present

## 2024-05-24 DIAGNOSIS — G629 Polyneuropathy, unspecified: Secondary | ICD-10-CM | POA: Diagnosis not present

## 2024-05-24 DIAGNOSIS — R278 Other lack of coordination: Secondary | ICD-10-CM | POA: Diagnosis not present

## 2024-05-24 DIAGNOSIS — Z9181 History of falling: Secondary | ICD-10-CM | POA: Diagnosis not present

## 2024-05-24 DIAGNOSIS — M1990 Unspecified osteoarthritis, unspecified site: Secondary | ICD-10-CM | POA: Diagnosis not present

## 2024-05-26 DIAGNOSIS — Z9181 History of falling: Secondary | ICD-10-CM | POA: Diagnosis not present

## 2024-05-26 DIAGNOSIS — G629 Polyneuropathy, unspecified: Secondary | ICD-10-CM | POA: Diagnosis not present

## 2024-05-26 DIAGNOSIS — R278 Other lack of coordination: Secondary | ICD-10-CM | POA: Diagnosis not present

## 2024-05-26 DIAGNOSIS — M1990 Unspecified osteoarthritis, unspecified site: Secondary | ICD-10-CM | POA: Diagnosis not present

## 2024-05-31 DIAGNOSIS — Z9181 History of falling: Secondary | ICD-10-CM | POA: Diagnosis not present

## 2024-05-31 DIAGNOSIS — M1990 Unspecified osteoarthritis, unspecified site: Secondary | ICD-10-CM | POA: Diagnosis not present

## 2024-05-31 DIAGNOSIS — G629 Polyneuropathy, unspecified: Secondary | ICD-10-CM | POA: Diagnosis not present

## 2024-05-31 DIAGNOSIS — R278 Other lack of coordination: Secondary | ICD-10-CM | POA: Diagnosis not present

## 2024-06-07 DIAGNOSIS — Z9181 History of falling: Secondary | ICD-10-CM | POA: Diagnosis not present

## 2024-06-07 DIAGNOSIS — R278 Other lack of coordination: Secondary | ICD-10-CM | POA: Diagnosis not present

## 2024-06-07 DIAGNOSIS — M1990 Unspecified osteoarthritis, unspecified site: Secondary | ICD-10-CM | POA: Diagnosis not present

## 2024-06-07 DIAGNOSIS — G629 Polyneuropathy, unspecified: Secondary | ICD-10-CM | POA: Diagnosis not present

## 2024-06-09 DIAGNOSIS — Z9181 History of falling: Secondary | ICD-10-CM | POA: Diagnosis not present

## 2024-06-09 DIAGNOSIS — R278 Other lack of coordination: Secondary | ICD-10-CM | POA: Diagnosis not present

## 2024-06-09 DIAGNOSIS — G629 Polyneuropathy, unspecified: Secondary | ICD-10-CM | POA: Diagnosis not present

## 2024-06-09 DIAGNOSIS — M1990 Unspecified osteoarthritis, unspecified site: Secondary | ICD-10-CM | POA: Diagnosis not present

## 2024-06-14 ENCOUNTER — Other Ambulatory Visit

## 2024-06-14 DIAGNOSIS — Z9181 History of falling: Secondary | ICD-10-CM | POA: Diagnosis not present

## 2024-06-14 DIAGNOSIS — M1990 Unspecified osteoarthritis, unspecified site: Secondary | ICD-10-CM | POA: Diagnosis not present

## 2024-06-14 DIAGNOSIS — G629 Polyneuropathy, unspecified: Secondary | ICD-10-CM | POA: Diagnosis not present

## 2024-06-14 DIAGNOSIS — R278 Other lack of coordination: Secondary | ICD-10-CM | POA: Diagnosis not present

## 2024-06-17 ENCOUNTER — Encounter (HOSPITAL_COMMUNITY): Payer: Self-pay | Admitting: Emergency Medicine

## 2024-06-17 ENCOUNTER — Inpatient Hospital Stay (HOSPITAL_COMMUNITY)
Admission: EM | Admit: 2024-06-17 | Discharge: 2024-06-21 | DRG: 291 | Disposition: A | Discharge status: Home Health | Attending: Family Medicine | Admitting: Family Medicine

## 2024-06-17 ENCOUNTER — Emergency Department (HOSPITAL_COMMUNITY)

## 2024-06-17 ENCOUNTER — Other Ambulatory Visit: Payer: Self-pay

## 2024-06-17 DIAGNOSIS — I480 Paroxysmal atrial fibrillation: Secondary | ICD-10-CM | POA: Diagnosis present

## 2024-06-17 DIAGNOSIS — Z7901 Long term (current) use of anticoagulants: Secondary | ICD-10-CM | POA: Diagnosis not present

## 2024-06-17 DIAGNOSIS — Z66 Do not resuscitate: Secondary | ICD-10-CM | POA: Diagnosis not present

## 2024-06-17 DIAGNOSIS — R531 Weakness: Secondary | ICD-10-CM | POA: Diagnosis not present

## 2024-06-17 DIAGNOSIS — Z79899 Other long term (current) drug therapy: Secondary | ICD-10-CM

## 2024-06-17 DIAGNOSIS — Z952 Presence of prosthetic heart valve: Secondary | ICD-10-CM

## 2024-06-17 DIAGNOSIS — I509 Heart failure, unspecified: Secondary | ICD-10-CM

## 2024-06-17 DIAGNOSIS — Z860100 Personal history of colon polyps, unspecified: Secondary | ICD-10-CM | POA: Diagnosis not present

## 2024-06-17 DIAGNOSIS — K219 Gastro-esophageal reflux disease without esophagitis: Secondary | ICD-10-CM | POA: Diagnosis not present

## 2024-06-17 DIAGNOSIS — J9601 Acute respiratory failure with hypoxia: Secondary | ICD-10-CM | POA: Diagnosis not present

## 2024-06-17 DIAGNOSIS — Z880 Allergy status to penicillin: Secondary | ICD-10-CM

## 2024-06-17 DIAGNOSIS — Z8249 Family history of ischemic heart disease and other diseases of the circulatory system: Secondary | ICD-10-CM

## 2024-06-17 DIAGNOSIS — E039 Hypothyroidism, unspecified: Secondary | ICD-10-CM | POA: Diagnosis present

## 2024-06-17 DIAGNOSIS — J441 Chronic obstructive pulmonary disease with (acute) exacerbation: Secondary | ICD-10-CM | POA: Diagnosis not present

## 2024-06-17 DIAGNOSIS — Z91048 Other nonmedicinal substance allergy status: Secondary | ICD-10-CM

## 2024-06-17 DIAGNOSIS — R918 Other nonspecific abnormal finding of lung field: Secondary | ICD-10-CM | POA: Diagnosis not present

## 2024-06-17 DIAGNOSIS — E0590 Thyrotoxicosis, unspecified without thyrotoxic crisis or storm: Secondary | ICD-10-CM | POA: Diagnosis not present

## 2024-06-17 DIAGNOSIS — Z885 Allergy status to narcotic agent status: Secondary | ICD-10-CM

## 2024-06-17 DIAGNOSIS — I4892 Unspecified atrial flutter: Secondary | ICD-10-CM | POA: Diagnosis present

## 2024-06-17 DIAGNOSIS — M810 Age-related osteoporosis without current pathological fracture: Secondary | ICD-10-CM | POA: Diagnosis present

## 2024-06-17 DIAGNOSIS — Z823 Family history of stroke: Secondary | ICD-10-CM

## 2024-06-17 DIAGNOSIS — I071 Rheumatic tricuspid insufficiency: Secondary | ICD-10-CM | POA: Diagnosis present

## 2024-06-17 DIAGNOSIS — Z8616 Personal history of COVID-19: Secondary | ICD-10-CM

## 2024-06-17 DIAGNOSIS — Z79890 Hormone replacement therapy (postmenopausal): Secondary | ICD-10-CM

## 2024-06-17 DIAGNOSIS — Z9889 Other specified postprocedural states: Secondary | ICD-10-CM

## 2024-06-17 DIAGNOSIS — I5033 Acute on chronic diastolic (congestive) heart failure: Secondary | ICD-10-CM | POA: Diagnosis present

## 2024-06-17 DIAGNOSIS — I2510 Atherosclerotic heart disease of native coronary artery without angina pectoris: Secondary | ICD-10-CM | POA: Diagnosis present

## 2024-06-17 DIAGNOSIS — R000 Tachycardia, unspecified: Secondary | ICD-10-CM | POA: Diagnosis not present

## 2024-06-17 DIAGNOSIS — Z888 Allergy status to other drugs, medicaments and biological substances status: Secondary | ICD-10-CM

## 2024-06-17 DIAGNOSIS — J42 Unspecified chronic bronchitis: Secondary | ICD-10-CM | POA: Diagnosis not present

## 2024-06-17 DIAGNOSIS — I5043 Acute on chronic combined systolic (congestive) and diastolic (congestive) heart failure: Secondary | ICD-10-CM | POA: Diagnosis not present

## 2024-06-17 DIAGNOSIS — I5031 Acute diastolic (congestive) heart failure: Secondary | ICD-10-CM | POA: Diagnosis not present

## 2024-06-17 DIAGNOSIS — Z515 Encounter for palliative care: Secondary | ICD-10-CM | POA: Diagnosis not present

## 2024-06-17 DIAGNOSIS — I110 Hypertensive heart disease with heart failure: Secondary | ICD-10-CM | POA: Diagnosis not present

## 2024-06-17 DIAGNOSIS — I517 Cardiomegaly: Secondary | ICD-10-CM | POA: Diagnosis not present

## 2024-06-17 DIAGNOSIS — R0602 Shortness of breath: Secondary | ICD-10-CM | POA: Diagnosis not present

## 2024-06-17 DIAGNOSIS — I340 Nonrheumatic mitral (valve) insufficiency: Secondary | ICD-10-CM | POA: Diagnosis not present

## 2024-06-17 DIAGNOSIS — I10 Essential (primary) hypertension: Secondary | ICD-10-CM | POA: Diagnosis not present

## 2024-06-17 DIAGNOSIS — Z7189 Other specified counseling: Secondary | ICD-10-CM | POA: Diagnosis not present

## 2024-06-17 DIAGNOSIS — I4891 Unspecified atrial fibrillation: Secondary | ICD-10-CM | POA: Diagnosis not present

## 2024-06-17 DIAGNOSIS — Z87891 Personal history of nicotine dependence: Secondary | ICD-10-CM | POA: Diagnosis not present

## 2024-06-17 DIAGNOSIS — E785 Hyperlipidemia, unspecified: Secondary | ICD-10-CM | POA: Diagnosis present

## 2024-06-17 DIAGNOSIS — R2243 Localized swelling, mass and lump, lower limb, bilateral: Secondary | ICD-10-CM | POA: Diagnosis not present

## 2024-06-17 DIAGNOSIS — I5032 Chronic diastolic (congestive) heart failure: Secondary | ICD-10-CM | POA: Diagnosis present

## 2024-06-17 LAB — BASIC METABOLIC PANEL WITH GFR
Anion gap: 13 (ref 5–15)
BUN: 23 mg/dL (ref 8–23)
CO2: 26 mmol/L (ref 22–32)
Calcium: 10 mg/dL (ref 8.9–10.3)
Chloride: 103 mmol/L (ref 98–111)
Creatinine, Ser: 0.98 mg/dL (ref 0.44–1.00)
GFR, Estimated: 54 mL/min — ABNORMAL LOW (ref >60)
Glucose, Bld: 162 mg/dL — ABNORMAL HIGH (ref 70–99)
Potassium: 4.1 mmol/L (ref 3.5–5.1)
Sodium: 142 mmol/L (ref 135–145)

## 2024-06-17 LAB — CBC
HCT: 41.2 % (ref 36.0–46.0)
Hemoglobin: 13.1 g/dL (ref 12.0–15.0)
MCH: 33 pg (ref 26.0–34.0)
MCHC: 31.8 g/dL (ref 30.0–36.0)
MCV: 103.8 fL — ABNORMAL HIGH (ref 80.0–100.0)
Platelets: 160 10*3/uL (ref 150–400)
RBC: 3.97 MIL/uL (ref 3.87–5.11)
RDW: 14.2 % (ref 11.5–15.5)
WBC: 7.4 10*3/uL (ref 4.0–10.5)
nRBC: 0 % (ref 0.0–0.2)

## 2024-06-17 LAB — BRAIN NATRIURETIC PEPTIDE: B Natriuretic Peptide: 341.9 pg/mL — ABNORMAL HIGH (ref 0.0–100.0)

## 2024-06-17 MED ORDER — IPRATROPIUM-ALBUTEROL 0.5-2.5 (3) MG/3ML IN SOLN
3.0000 mL | Freq: Once | RESPIRATORY_TRACT | Status: AC
  Administered 2024-06-17: 3 mL via RESPIRATORY_TRACT
  Filled 2024-06-17: qty 3

## 2024-06-17 MED ORDER — FUROSEMIDE 10 MG/ML IJ SOLN
20.0000 mg | Freq: Once | INTRAMUSCULAR | Status: AC
  Administered 2024-06-18: 20 mg via INTRAVENOUS
  Filled 2024-06-17: qty 2

## 2024-06-17 NOTE — ED Provider Notes (Signed)
  EMERGENCY DEPARTMENT AT [facility] Provider Note   CSN: [id number] Arrival date & time: 06/17/24  2226     Patient presents with: Shortness of Breath and Atrial Fibrillation   Amy Fischer is a 89 y.o. female with medical history to include hypothyroid, hyperlipidemia, GERD, diverticulosis, CHF, COPD, gait imbalance uses walker, a fib on eliquis .  Patient presents to ED for evaluation of shortness of breath, leg swelling.  Patient states she has had worsening lower extremity edema over the last 3 days.  She reports a history of CHF and states she is supposed to take Lasix  as needed.  Reports took Lasix  this morning around 10 AM and has had a lot of urination since then.  She reports that about 2 hours prior to calling EMS she became very short of breath.  She describes this as not being able to take a deep breath.  She denies any chest pain associated with this.  She denies any lightheadedness, dizziness or weakness when going from a sitting to a standing position.  She reports that when she lies back flat on her back it makes her shortness of breath worse.  Per triage note, EMS found the patient with a pulse rate 140-160 in A-fib.  The patient was given 10 mg of Cardizem and 150 normal saline patient pulse rate reduced to 80-100.  Patient arrives in A-fib with a controlled rate.  She denies any change in her symptoms once EMS gave Cardizem.  She denies nausea, vomiting or abdominal pain.    Shortness of Breath Associated symptoms: no chest pain   Atrial Fibrillation Associated symptoms include shortness of breath. Pertinent negatives include no chest pain.       Prior to Admission medications   Medication Sig Start Date End Date Taking? Authorizing Provider  albuterol  (VENTOLIN  HFA) 108 (90 Base) MCG/ACT inhaler Inhale 2 puffs into the lungs every 6 (six) hours as needed for wheezing or shortness of breath. 12/24/21  Yes Kozlow, Camellia PARAS, MD  apixaban  (ELIQUIS ) 2.5  MG TABS tablet TAKE 1 TABLET TWICE A DAY 05/05/23  Yes Pietro Redell RAMAN, MD  cholecalciferol  (VITAMIN D ) 1000 units tablet Take 1,000 Units by mouth daily.   Yes [provider]  ezetimibe  (ZETIA ) 10 MG tablet TAKE 1 TABLET DAILY 08/04/23  Yes Panosh, Wanda K, MD  folic acid  (FOLVITE ) 1 MG tablet TAKE 1 TABLET DAILY 08/13/23  Yes Panosh, Wanda K, MD  furosemide  (LASIX ) 20 MG tablet Take one tablet as needed for shortness of breathe and leg edema 07/10/23  Yes Meng, Hao, PA  ipratropium (ATROVENT ) 0.06 % nasal spray USE 1 SPRAY IN EACH NOSTRIL TWICE A DAY AS NEEDED FOR RHINITIS 04/27/23  Yes Webb, Padonda B, FNP  levothyroxine  (SYNTHROID ) 100 MCG tablet TAKE 1 TABLET EVERY MORNING ON AN EMPTY STOMACH Patient taking differently: No sig reported 10/14/23  Yes Douglass Caul B, FNP  melatonin 5 MG TABS Take 5 mg by mouth at bedtime.   Yes [provider]  Polyethyl Glycol-Propyl Glycol 0.4-0.3 % SOLN Place 1 drop into both eyes 2 (two) times daily.   Yes [provider]  rosuvastatin  (CRESTOR ) 5 MG tablet Take 0.5 tablets (2.5 mg total) by mouth daily. 08/03/23  Yes Panosh, Wanda K, MD  SALINE NASAL SPRAY NA Place 1 spray into both nostrils in the morning and at bedtime.   Yes [provider]  vitamin B-12 (CYANOCOBALAMIN ) 1000 MCG tablet Take 1,000 mcg by mouth daily.  Yes [provider]    Allergies: Codeine, Risedronate sodium, Statins, Tape, and Amoxicillin -pot clavulanate    Review of Systems  Respiratory:  Positive for shortness of breath.   Cardiovascular:  Positive for leg swelling. Negative for chest pain.  All other systems reviewed and are negative.   Updated Vital Signs BP 125/77   Pulse (!) 115   Temp 98.4 F (36.9 C) (Oral)   Resp 20   Ht 5' 1 (1.549 m)   Wt 44.9 kg   SpO2 96%   BMI 18.71 kg/m   Physical Exam Vitals and nursing note reviewed.  Constitutional:      General: She is not in acute distress.    Appearance: She is  well-developed.  HENT:     Head: Normocephalic and atraumatic.   Eyes:     Conjunctiva/sclera: Conjunctivae normal.    Cardiovascular:     Rate and Rhythm: Normal rate and regular rhythm.     Heart sounds: No murmur heard. Pulmonary:     Effort: Pulmonary effort is normal. No respiratory distress.     Breath sounds: Examination of the right-upper field reveals wheezing. Wheezing present.  Abdominal:     Palpations: Abdomen is soft.     Tenderness: There is no abdominal tenderness.   Musculoskeletal:        General: No swelling.     Cervical back: Neck supple.     Right lower leg: Edema present.     Left lower leg: Edema present.     Comments: Bilateral 2+ pitting edema   Skin:    General: Skin is warm and dry.     Capillary Refill: Capillary refill takes less than 2 seconds.   Neurological:     Mental Status: She is alert.   Psychiatric:        Mood and Affect: Mood normal.     (all labs ordered are listed, but only abnormal results are displayed) Labs Reviewed  BASIC METABOLIC PANEL WITH GFR - Abnormal; Notable for the following components:      Result Value   Glucose, Bld 162 (*)    GFR, Estimated 54 (*)    All other components within normal limits  CBC - Abnormal; Notable for the following components:   MCV 103.8 (*)    All other components within normal limits  BRAIN NATRIURETIC PEPTIDE - Abnormal; Notable for the following components:   B Natriuretic Peptide 341.9 (*)    All other components within normal limits    EKG: EKG Interpretation Date/Time:  Friday June 17 2024 22:36:20 EDT Ventricular Rate:  101 PR Interval:    QRS Duration:  89 QT Interval:  294 QTC Calculation: 400 R Axis:   44  Text Interpretation: Atrial flutter RSR' in V1 or V2, probably normal variant Repol abnrm suggests ischemia, lateral leads Confirmed by Geroldine Berg ([id number]) on 06/17/2024 11:42:07 PM  Radiology: [facility] Chest Portable 1 View Result Date: 06/17/2024 CLINICAL DATA:   Shortness of breath EXAM: PORTABLE CHEST 1 VIEW COMPARISON:  11/13/2023, 12/28/2022, chest CT 12/28/2022 FINDINGS: Post sternotomy changes. Valve prosthesis. Cardiomegaly. Diffuse chronic bronchitic changes. No pleural effusion or pneumothorax. Possible mild patchy infiltrate at the right base. IMPRESSION: 1. Cardiomegaly without overt edema 2. Chronic bronchitic changes. Atelectasis or scarring left base. Possible mild patchy infiltrate at the right base. Electronically Signed   By: Luke Fischer M.D.   On: 06/17/2024 22:55      Medications Ordered in the ED  ipratropium-albuterol  (DUONEB) 0.5-2.5 (3)  MG/3ML nebulizer solution 3 mL (3 mLs Nebulization Given 06/17/24 2315)  furosemide  (LASIX ) injection 20 mg (20 mg Intravenous Given 06/18/24 0016)    Medical Decision Making Amount and/or Complexity of Data Reviewed Labs: ordered. Radiology: ordered.   89 year old female presents for evaluation.  Please see HPI for further details.  On exam the patient is afebrile, tachycardic and rhythm of atrial fibrillation on the monitor.  Patient pulse rate bounces between 95 bpm and 105 bpm.  Apparently patient was found in A-fib RVR with pulse rate of 140-160 and given Cardizem.  Her lungs have wheezing in right upper lobe, she is not hypoxic.  Abdomen is soft and compressible.  Neurological examination at baseline.  She has 2+ pitting edema to her bilateral lower extremities.  Will collect CBC, BMP, BNP, chest x-ray and EKG.  Patient rate appears controlled at this time after Cardizem administration.  CBC without leukocytosis or anemia.  Metabolic panel without electrolyte derangement, anion gap 13.  Patient BNP 341.9.  Chest x-ray showed cardiomegaly, also shows concerning mild infiltrate.  Patient has no white count, no new cough so do not believe patient has pneumonia.  Patient rate still controlled.  She is still in A-fib.  History of A-fib and on Eliquis .  At this time, discussed with attending Dr.  Geroldine, we feel patient requires admission to the hospital for A-fib which most likely cause elevation of BNP.  Have discussed with Dr. Sim who has agreed to admit.  Patient amenable to plan.  Patient stable on admission.   Final diagnoses:  Atrial fibrillation with RVR (HCC)  Atrial fibrillation, unspecified type (HCC)  Acute on chronic congestive heart failure, unspecified heart failure type [facility])    ED Discharge Orders     None          Amy Fischer 06/18/24 0123    Geroldine Berg, MD 06/18/24 [id number]

## 2024-06-17 NOTE — ED Triage Notes (Signed)
 Pt to ED via EMS from home c/o SOB x2 hours, pt states difficulty taking deep breath.  Denies pain.  Hx of COPD, CHF, and afib.  Takes lasix  PRN.  Pt with bilateral lower edema for several days.  EMS states HR 140-160 and given 10 cardizem with rate then down to 80-100 and also given 150cc NS.

## 2024-06-17 NOTE — ED Provider Notes (Incomplete)
 [place] EMERGENCY DEPARTMENT AT [facility] Provider Note   CSN: [id number] Arrival date & time: 06/17/24  2226     Patient presents with: Shortness of Breath and Atrial Fibrillation   Amy Fischer is a 89 y.o. female with medical history to include hypothyroid, hyperlipidemia, GERD, diverticulosis, CHF, COPD, gait imbalance uses walker, a fib on eliquis .  Patient presents to ED for evaluation of shortness of breath, leg swelling.  Patient states she has had worsening lower extremity edema over the last 3 days.  She reports a history of CHF and states she is supposed to take Lasix  as needed.  Reports took Lasix  this morning around 10 AM and has had a lot of urination since then.  She reports that about 2 hours prior to calling EMS she became very short of breath.  She describes this as not being able to take a deep breath.  She denies any chest pain associated with this.  She denies any lightheadedness, dizziness or weakness when going from a sitting to a standing position.  She reports that when she lies back flat on her back it makes her shortness of breath worse.  Per triage note, EMS found the patient with a pulse rate 140-160 in A-fib.  The patient was given 10 mg of Cardizem and 150 normal saline patient pulse rate reduced to 80-100.  Patient arrives in A-fib with a controlled rate.  She denies any change in her symptoms once EMS gave Cardizem.  She denies nausea, vomiting or abdominal pain.    Shortness of Breath Associated symptoms: no chest pain   Atrial Fibrillation Associated symptoms include shortness of breath. Pertinent negatives include no chest pain.       Prior to Admission medications   Medication Sig Start Date End Date Taking? Authorizing Provider  albuterol  (VENTOLIN  HFA) 108 (90 Base) MCG/ACT inhaler Inhale 2 puffs into the lungs every 6 (six) hours as needed for wheezing or shortness of breath. 12/24/21   Kozlow, Rema Care, MD  apixaban  (ELIQUIS ) 2.5 MG  TABS tablet TAKE 1 TABLET TWICE A DAY 05/05/23   Lenise Quince, MD  cholecalciferol  (VITAMIN D ) 1000 units tablet Take 1,000 Units by mouth daily.    [provider]  ezetimibe  (ZETIA ) 10 MG tablet TAKE 1 TABLET DAILY 08/04/23   Panosh, Wanda K, MD  folic acid  (FOLVITE ) 1 MG tablet TAKE 1 TABLET DAILY 08/13/23   Panosh, Wanda K, MD  furosemide  (LASIX ) 20 MG tablet Take one tablet as needed for shortness of breathe and leg edema 07/10/23   Meng, Hao, PA  ipratropium (ATROVENT ) 0.06 % nasal spray USE 1 SPRAY IN EACH NOSTRIL TWICE A DAY AS NEEDED FOR RHINITIS 04/27/23   Webb, Padonda B, FNP  levothyroxine  (SYNTHROID ) 100 MCG tablet TAKE 1 TABLET EVERY MORNING ON AN EMPTY STOMACH Patient taking differently: TAKE 1 TABLET Monday - Friday  MORNING ON AN EMPTY STOMACH 10/14/23   Jarold Merlin B, FNP  Melatonin 3 MG TABS Take 3 mg by mouth at bedtime.     [provider]  Polyethyl Glycol-Propyl Glycol 0.4-0.3 % SOLN Place 1 drop into both eyes 2 (two) times daily.    [provider]  rosuvastatin  (CRESTOR ) 5 MG tablet Take 0.5 tablets (2.5 mg total) by mouth daily. 08/03/23   Panosh, Wanda K, MD  SALINE NASAL SPRAY NA Place 1 spray into both nostrils in the morning and at bedtime.    [provider]  vitamin B-12 (CYANOCOBALAMIN )  1000 MCG tablet Take 1,000 mcg by mouth daily.    [provider]    Allergies: Codeine, Risedronate sodium, Statins, Tape, and Amoxicillin -pot clavulanate    Review of Systems  Respiratory:  Positive for shortness of breath.   Cardiovascular:  Positive for leg swelling. Negative for chest pain.  All other systems reviewed and are negative.   Updated Vital Signs BP (!) 155/95 (BP Location: Right Arm)   Pulse (!) 57   Temp 98.4 F (36.9 C) (Oral)   Resp (!) 24   Ht 5' 1 (1.549 m)   Wt 44.9 kg   SpO2 96%   BMI 18.71 kg/m   Physical Exam Pulmonary:     Breath sounds: Examination of the right-upper field reveals wheezing.  Wheezing present.     (all labs ordered are listed, but only abnormal results are displayed) Labs Reviewed  BASIC METABOLIC PANEL WITH GFR  CBC  BRAIN NATRIURETIC PEPTIDE    EKG: None  Radiology: No results found.    Medications Ordered in the ED - No data to display  Medical Decision Making Amount and/or Complexity of Data Reviewed Labs: ordered. Radiology: ordered.   ***    Final diagnoses:  None    ED Discharge Orders     None

## 2024-06-18 ENCOUNTER — Inpatient Hospital Stay (HOSPITAL_COMMUNITY)

## 2024-06-18 DIAGNOSIS — I509 Heart failure, unspecified: Secondary | ICD-10-CM | POA: Diagnosis not present

## 2024-06-18 DIAGNOSIS — I5031 Acute diastolic (congestive) heart failure: Secondary | ICD-10-CM | POA: Diagnosis not present

## 2024-06-18 DIAGNOSIS — J9601 Acute respiratory failure with hypoxia: Secondary | ICD-10-CM | POA: Diagnosis present

## 2024-06-18 DIAGNOSIS — E785 Hyperlipidemia, unspecified: Secondary | ICD-10-CM | POA: Diagnosis present

## 2024-06-18 DIAGNOSIS — I4892 Unspecified atrial flutter: Secondary | ICD-10-CM

## 2024-06-18 DIAGNOSIS — Z952 Presence of prosthetic heart valve: Secondary | ICD-10-CM | POA: Diagnosis not present

## 2024-06-18 DIAGNOSIS — I480 Paroxysmal atrial fibrillation: Secondary | ICD-10-CM | POA: Diagnosis present

## 2024-06-18 DIAGNOSIS — Z66 Do not resuscitate: Secondary | ICD-10-CM | POA: Diagnosis present

## 2024-06-18 DIAGNOSIS — Z860100 Personal history of colon polyps, unspecified: Secondary | ICD-10-CM | POA: Diagnosis not present

## 2024-06-18 DIAGNOSIS — I2510 Atherosclerotic heart disease of native coronary artery without angina pectoris: Secondary | ICD-10-CM | POA: Diagnosis present

## 2024-06-18 DIAGNOSIS — Z8249 Family history of ischemic heart disease and other diseases of the circulatory system: Secondary | ICD-10-CM | POA: Diagnosis not present

## 2024-06-18 DIAGNOSIS — Z515 Encounter for palliative care: Secondary | ICD-10-CM

## 2024-06-18 DIAGNOSIS — I340 Nonrheumatic mitral (valve) insufficiency: Secondary | ICD-10-CM | POA: Diagnosis present

## 2024-06-18 DIAGNOSIS — Z823 Family history of stroke: Secondary | ICD-10-CM | POA: Diagnosis not present

## 2024-06-18 DIAGNOSIS — Z87891 Personal history of nicotine dependence: Secondary | ICD-10-CM | POA: Diagnosis not present

## 2024-06-18 DIAGNOSIS — M810 Age-related osteoporosis without current pathological fracture: Secondary | ICD-10-CM | POA: Diagnosis present

## 2024-06-18 DIAGNOSIS — I5043 Acute on chronic combined systolic (congestive) and diastolic (congestive) heart failure: Secondary | ICD-10-CM | POA: Diagnosis not present

## 2024-06-18 DIAGNOSIS — Z8616 Personal history of COVID-19: Secondary | ICD-10-CM | POA: Diagnosis not present

## 2024-06-18 DIAGNOSIS — E039 Hypothyroidism, unspecified: Secondary | ICD-10-CM | POA: Diagnosis present

## 2024-06-18 DIAGNOSIS — I110 Hypertensive heart disease with heart failure: Secondary | ICD-10-CM | POA: Diagnosis present

## 2024-06-18 DIAGNOSIS — I071 Rheumatic tricuspid insufficiency: Secondary | ICD-10-CM | POA: Diagnosis present

## 2024-06-18 DIAGNOSIS — Z885 Allergy status to narcotic agent status: Secondary | ICD-10-CM | POA: Diagnosis not present

## 2024-06-18 DIAGNOSIS — I4891 Unspecified atrial fibrillation: Secondary | ICD-10-CM

## 2024-06-18 DIAGNOSIS — Z7189 Other specified counseling: Secondary | ICD-10-CM | POA: Diagnosis not present

## 2024-06-18 DIAGNOSIS — K219 Gastro-esophageal reflux disease without esophagitis: Secondary | ICD-10-CM | POA: Diagnosis present

## 2024-06-18 DIAGNOSIS — J441 Chronic obstructive pulmonary disease with (acute) exacerbation: Secondary | ICD-10-CM | POA: Diagnosis present

## 2024-06-18 DIAGNOSIS — Z7901 Long term (current) use of anticoagulants: Secondary | ICD-10-CM | POA: Diagnosis not present

## 2024-06-18 DIAGNOSIS — I5033 Acute on chronic diastolic (congestive) heart failure: Secondary | ICD-10-CM | POA: Diagnosis present

## 2024-06-18 DIAGNOSIS — R531 Weakness: Secondary | ICD-10-CM

## 2024-06-18 DIAGNOSIS — E0590 Thyrotoxicosis, unspecified without thyrotoxic crisis or storm: Secondary | ICD-10-CM | POA: Diagnosis present

## 2024-06-18 DIAGNOSIS — Z79899 Other long term (current) drug therapy: Secondary | ICD-10-CM | POA: Diagnosis not present

## 2024-06-18 LAB — ECHOCARDIOGRAM COMPLETE
AR max vel: 2.17 cm2
AV Peak grad: 2.9 mmHg
Ao pk vel: 0.86 m/s
Area-P 1/2: 3.27 cm2
Height: 61 in
MV VTI: 0.67 cm2
S' Lateral: 2.2 cm
Weight: 1584 [oz_av]

## 2024-06-18 LAB — TSH: TSH: 8.721 u[IU]/mL — ABNORMAL HIGH (ref 0.350–4.500)

## 2024-06-18 LAB — T4, FREE: Free T4: 1.24 ng/dL — ABNORMAL HIGH (ref 0.61–1.12)

## 2024-06-18 MED ORDER — SACUBITRIL-VALSARTAN 24-26 MG PO TABS
1.0000 | ORAL_TABLET | Freq: Two times a day (BID) | ORAL | Status: DC
  Administered 2024-06-18 (×2): 1 via ORAL
  Filled 2024-06-18 (×3): qty 1

## 2024-06-18 MED ORDER — VITAMIN D 25 MCG (1000 UNIT) PO TABS
1000.0000 [IU] | ORAL_TABLET | Freq: Every day | ORAL | Status: DC
  Administered 2024-06-18 – 2024-06-21 (×4): 1000 [IU] via ORAL
  Filled 2024-06-18 (×4): qty 1

## 2024-06-18 MED ORDER — LEVOTHYROXINE SODIUM 100 MCG PO TABS
100.0000 ug | ORAL_TABLET | Freq: Every day | ORAL | Status: DC
  Administered 2024-06-18 – 2024-06-21 (×4): 100 ug via ORAL
  Filled 2024-06-18 (×4): qty 1

## 2024-06-18 MED ORDER — FUROSEMIDE 10 MG/ML IJ SOLN
20.0000 mg | Freq: Two times a day (BID) | INTRAMUSCULAR | Status: DC
  Administered 2024-06-18 (×2): 20 mg via INTRAVENOUS
  Filled 2024-06-18 (×2): qty 2

## 2024-06-18 MED ORDER — POLYVINYL ALCOHOL 1.4 % OP SOLN
1.0000 [drp] | Freq: Two times a day (BID) | OPHTHALMIC | Status: DC
  Administered 2024-06-18 – 2024-06-21 (×6): 1 [drp] via OPHTHALMIC
  Filled 2024-06-18: qty 15

## 2024-06-18 MED ORDER — ENSURE PLUS HIGH PROTEIN PO LIQD
237.0000 mL | Freq: Two times a day (BID) | ORAL | Status: DC
  Administered 2024-06-19 – 2024-06-20 (×4): 237 mL via ORAL

## 2024-06-18 MED ORDER — EZETIMIBE 10 MG PO TABS
10.0000 mg | ORAL_TABLET | Freq: Every day | ORAL | Status: DC
  Administered 2024-06-18 – 2024-06-21 (×4): 10 mg via ORAL
  Filled 2024-06-18 (×4): qty 1

## 2024-06-18 MED ORDER — SODIUM CHLORIDE 0.9% FLUSH: 3.0000 mL | INTRAVENOUS | Status: DC | PRN

## 2024-06-18 MED ORDER — ALBUTEROL SULFATE HFA 108 (90 BASE) MCG/ACT IN AERS: 2.0000 | INHALATION_SPRAY | Freq: Four times a day (QID) | RESPIRATORY_TRACT | Status: DC | PRN

## 2024-06-18 MED ORDER — ROPINIROLE HCL 0.5 MG PO TABS
0.2500 mg | ORAL_TABLET | Freq: Every day | ORAL | Status: DC
  Administered 2024-06-18 – 2024-06-20 (×4): 0.25 mg via ORAL
  Filled 2024-06-18 (×4): qty 1

## 2024-06-18 MED ORDER — ALBUTEROL SULFATE (2.5 MG/3ML) 0.083% IN NEBU: 2.5000 mg | INHALATION_SOLUTION | Freq: Four times a day (QID) | RESPIRATORY_TRACT | Status: DC | PRN

## 2024-06-18 MED ORDER — SODIUM CHLORIDE 0.9 % IV SOLN: 250.0000 mL | INTRAVENOUS | Status: DC | PRN

## 2024-06-18 MED ORDER — FOLIC ACID 1 MG PO TABS
1.0000 mg | ORAL_TABLET | Freq: Every day | ORAL | Status: DC
  Administered 2024-06-18 – 2024-06-19 (×2): 1 mg via ORAL
  Filled 2024-06-18 (×2): qty 1

## 2024-06-18 MED ORDER — METHOCARBAMOL 500 MG PO TABS
500.0000 mg | ORAL_TABLET | Freq: Three times a day (TID) | ORAL | Status: DC | PRN
  Administered 2024-06-18 (×2): 500 mg via ORAL
  Filled 2024-06-18 (×3): qty 1

## 2024-06-18 MED ORDER — MELATONIN 3 MG PO TABS
3.0000 mg | ORAL_TABLET | Freq: Every day | ORAL | Status: DC
  Administered 2024-06-18: 3 mg via ORAL
  Filled 2024-06-18: qty 1

## 2024-06-18 MED ORDER — LEVALBUTEROL HCL 0.63 MG/3ML IN NEBU
0.6300 mg | INHALATION_SOLUTION | Freq: Four times a day (QID) | RESPIRATORY_TRACT | Status: DC
  Administered 2024-06-18: 0.63 mg via RESPIRATORY_TRACT
  Filled 2024-06-18: qty 3

## 2024-06-18 MED ORDER — ROSUVASTATIN CALCIUM 5 MG PO TABS
2.5000 mg | ORAL_TABLET | Freq: Every day | ORAL | Status: DC
  Administered 2024-06-18 – 2024-06-21 (×4): 2.5 mg via ORAL
  Filled 2024-06-18 (×4): qty 1

## 2024-06-18 MED ORDER — VITAMIN B-12 1000 MCG PO TABS
1000.0000 ug | ORAL_TABLET | Freq: Every day | ORAL | Status: DC
  Administered 2024-06-18 – 2024-06-21 (×4): 1000 ug via ORAL
  Filled 2024-06-18 (×4): qty 1

## 2024-06-18 MED ORDER — METOPROLOL TARTRATE 25 MG PO TABS: 25.0000 mg | ORAL_TABLET | Freq: Four times a day (QID) | ORAL | Status: DC

## 2024-06-18 MED ORDER — METOPROLOL TARTRATE 25 MG PO TABS
25.0000 mg | ORAL_TABLET | Freq: Four times a day (QID) | ORAL | Status: DC
  Administered 2024-06-18 (×2): 25 mg via ORAL
  Filled 2024-06-18 (×4): qty 1

## 2024-06-18 MED ORDER — ACETAMINOPHEN 325 MG PO TABS: 650.0000 mg | ORAL_TABLET | ORAL | Status: DC | PRN

## 2024-06-18 MED ORDER — FUROSEMIDE 10 MG/ML IJ SOLN: 20.0000 mg | Freq: Every day | INTRAMUSCULAR | Status: DC

## 2024-06-18 MED ORDER — METOPROLOL SUCCINATE ER 25 MG PO TB24
12.5000 mg | ORAL_TABLET | Freq: Every day | ORAL | Status: DC
  Administered 2024-06-18: 12.5 mg via ORAL
  Filled 2024-06-18: qty 1

## 2024-06-18 MED ORDER — APIXABAN 2.5 MG PO TABS
2.5000 mg | ORAL_TABLET | Freq: Two times a day (BID) | ORAL | Status: DC
  Administered 2024-06-18 – 2024-06-21 (×7): 2.5 mg via ORAL
  Filled 2024-06-18 (×7): qty 1

## 2024-06-18 MED ORDER — ONDANSETRON HCL 4 MG/2ML IJ SOLN
4.0000 mg | Freq: Four times a day (QID) | INTRAMUSCULAR | Status: DC | PRN
  Administered 2024-06-19: 4 mg via INTRAVENOUS
  Filled 2024-06-18: qty 2

## 2024-06-18 MED ORDER — IPRATROPIUM BROMIDE 0.06 % NA SOLN
1.0000 | Freq: Three times a day (TID) | NASAL | Status: DC
  Administered 2024-06-18 – 2024-06-21 (×8): 1 via NASAL
  Filled 2024-06-18: qty 15

## 2024-06-18 MED ORDER — FUROSEMIDE 10 MG/ML IJ SOLN
20.0000 mg | Freq: Every day | INTRAMUSCULAR | Status: DC
Start: 2024-06-18 — End: 2024-06-18

## 2024-06-18 MED ORDER — SODIUM CHLORIDE 0.9% FLUSH
3.0000 mL | Freq: Two times a day (BID) | INTRAVENOUS | Status: DC
  Administered 2024-06-18 (×3): 3 mL via INTRAVENOUS

## 2024-06-18 MED ORDER — ROPINIROLE HCL 0.25 MG PO TABS: 0.2500 mg | ORAL_TABLET | Freq: Every day | ORAL | Status: DC

## 2024-06-18 NOTE — Progress Notes (Signed)
 Echocardiogram 2D Echocardiogram has been performed.  Thea Norlander 06/18/2024, 2:58 PM

## 2024-06-18 NOTE — ED Notes (Addendum)
 ECHO is currently in room performing echocardiogram.

## 2024-06-18 NOTE — ED Notes (Signed)
 Purewick placed, pt made comfortable.

## 2024-06-18 NOTE — H&P (Signed)
 History and Physical    Patient: Amy Fischer FMW:[id number] DOB: 08/08/1929 DOA: 06/17/2024 DOS: the patient was seen and examined on 06/18/2024 PCP: Charlett [name], MD  Patient coming from: Home  Chief Complaint:  Chief Complaint  Patient presents with   Shortness of Breath   Atrial Fibrillation   HPI: Amy Fischer is a 89 y.o. female with medical history significant of diastolic dysfunction CHF, GERD, hyperlipidemia, osteoarthritis, osteoporosis, carotid artery stenosis, hypothyroidism, essential hypertension and esophageal spasm who was brought in from home secondary to sudden onset shortness of breath for about 2 hours.  She was having difficulty taking deep breath.  Especially with mild exertion.  Patient has reported history of COPD although not documented anywhere.  She has history of A-fib and when EMS arrived her heart rate was 140s to 160s in atrial fibrillation.  She was given 10 mg of Cardizem IV and the rate has dropped to between 80-100.  Patient also received pembrolizumab 150 cc from EMS.  In the ER she been evaluated and treated with a dose of IV Lasix .  Patient is no longer requiring oxygen but still weak.  Patient appears to be acute exacerbation of CHF versus COPD.  Review of Systems: As mentioned in the history of present illness. All other systems reviewed and are negative. Past Medical History:  Diagnosis Date   Allergy    Anemia    Asymptomatic carotid artery stenosis    R ICA 40% stenosis on CTA 12/2011.   Bladder polyps    Cataract    BILATERAL-REMOVED   CHF (congestive heart failure) (HCC)    Diverticulosis    Elevated blood pressure 03/25/2011   Bp readings borderline today has hx of elvation  In office and ok at home   Not checke recently   She gfeels was elevated from anxiety     Episodic recurrent vertigo    MRI Head 2003   Esophageal spasm    GERD (gastroesophageal reflux disease)    Hepatic hemangioma    History of hepatitis    unknown type    Hyperlipidemia    Hyperplastic colon polyp    Hypothyroidism    Intestinal metaplasia of gastric mucosa    Osteoarthritis    Osteoporosis    Patellar fracture 07/13/2012   Scapular fracture 03/31/2012   small from direct blow with trip     Subclavian steal syndrome    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; CTA 2013 revealed open graft   Past Surgical History:  Procedure Laterality Date   APPENDECTOMY     CARDIAC CATHETERIZATION N/A 10/04/2015   Procedure: Right/Left Heart Cath and Coronary Angiography;  Surgeon: Victory LELON Sharps, MD;  Location: [facility] INVASIVE CV LAB;  Service: Cardiovascular;  Laterality: N/A;   CAROTID-SUBCLAVIAN BYPASS GRAFT Left 1999   for [place] steal syndrome   CHOLECYSTECTOMY     COLONOSCOPY     CYSTECTOMY Left    hand   ELBOW SURGERY Left    KNEE SURGERY Left 2014   MITRAL VALVE REPAIR N/A 10/10/2015   Procedure: MITRAL VALVE REPAIR (MVR) WITH SIZE 28 CARPENTIER-EDWARDS PHYSIO II ANNULOPLASTY RING;  Surgeon: Elspeth JAYSON Millers, MD;  Location: [facility] OR;  Service: Open Heart Surgery;  Laterality: N/A;   ROTATOR CUFF REPAIR Left    TEAR DUCT PROBING  07/2014   TEE WITHOUT CARDIOVERSION N/A 10/03/2015   Procedure: TRANSESOPHAGEAL ECHOCARDIOGRAM (TEE);  Surgeon: Ezra GORMAN Shuck, MD;  Location: [facility] ENDOSCOPY;  Service: Cardiovascular;  Laterality: N/A;   TEE WITHOUT CARDIOVERSION N/A 10/10/2015   Procedure: TRANSESOPHAGEAL ECHOCARDIOGRAM (TEE);  Surgeon: Elspeth JAYSON Millers, MD;  Location: [facility] OR;  Service: Open Heart Surgery;  Laterality: N/A;   TONSILLECTOMY     TUBAL LIGATION     Social History:  reports that she quit smoking about 33 years ago. Her smoking use included cigarettes. She started smoking about 53 years ago. She has a 15 pack-year smoking history. She has never used smokeless tobacco. She reports that she does not currently use alcohol  after a past usage of about 4.0 standard drinks of alcohol  per week. She reports that she does not use drugs.  Allergies   Allergen Reactions   Codeine Nausea And Vomiting   Risedronate Sodium     Upset stomach. Could take Fosamax .   Statins     Muscles hurt, can take low dose    Tape Other (See Comments)    Blisters, Please use paper tape   Amoxicillin -Pot Clavulanate Diarrhea    Not allergic  2014, Pt can take z pack only     Family History  Problem Relation Age of Onset   Hypertension Mother    Stroke Mother    Heart disease Father    Colon cancer Neg Hx    Neurofibromatosis Neg Hx    Allergic rhinitis Neg Hx    Asthma Neg Hx    Angioedema Neg Hx    Atopy Neg Hx    Eczema Neg Hx    Immunodeficiency Neg Hx    Urticaria Neg Hx     Prior to Admission medications   Medication Sig Start Date End Date Taking? Authorizing Provider  albuterol  (VENTOLIN  HFA) 108 (90 Base) MCG/ACT inhaler Inhale 2 puffs into the lungs every 6 (six) hours as needed for wheezing or shortness of breath. 12/24/21   Kozlow, Camellia PARAS, MD  apixaban  (ELIQUIS ) 2.5 MG TABS tablet TAKE 1 TABLET TWICE A DAY 05/05/23   Pietro Redell RAMAN, MD  cholecalciferol  (VITAMIN D ) 1000 units tablet Take 1,000 Units by mouth daily.    [provider]  ezetimibe  (ZETIA ) 10 MG tablet TAKE 1 TABLET DAILY 08/04/23   Panosh, Wanda K, MD  folic acid  (FOLVITE ) 1 MG tablet TAKE 1 TABLET DAILY 08/13/23   Panosh, Wanda K, MD  furosemide  (LASIX ) 20 MG tablet Take one tablet as needed for shortness of breathe and leg edema 07/10/23   Meng, Hao, PA  ipratropium (ATROVENT ) 0.06 % nasal spray USE 1 SPRAY IN EACH NOSTRIL TWICE A DAY AS NEEDED FOR RHINITIS 04/27/23   Webb, Padonda B, FNP  levothyroxine  (SYNTHROID ) 100 MCG tablet TAKE 1 TABLET EVERY MORNING ON AN EMPTY STOMACH Patient taking differently: TAKE 1 TABLET Monday - Friday  MORNING ON AN EMPTY STOMACH 10/14/23   Douglass Caul B, FNP  Melatonin 3 MG TABS Take 3 mg by mouth at bedtime.     [provider]  Polyethyl Glycol-Propyl Glycol 0.4-0.3 % SOLN Place 1 drop into both eyes 2 (two)  times daily.    [provider]  rosuvastatin  (CRESTOR ) 5 MG tablet Take 0.5 tablets (2.5 mg total) by mouth daily. 08/03/23   Panosh, Wanda K, MD  SALINE NASAL SPRAY NA Place 1 spray into both nostrils in the morning and at bedtime.    [provider]  vitamin B-12 (CYANOCOBALAMIN ) 1000 MCG tablet Take 1,000 mcg by mouth daily.    [provider]    Physical Exam: Vitals:   06/17/24 2233 06/17/24  2236 06/17/24 2300  BP:  (!) 155/95 125/70  Pulse:  (!) 57 99  Resp:  (!) 24 (!) 23  Temp:  98.4 F (36.9 C)   TempSrc:  Oral   SpO2:  96% 96%  Weight: 44.9 kg    Height: 5' 1 (1.549 m)     Constitutional: Chronically ill looking, awake NAD, calm, comfortable Eyes: PERRL, lids and conjunctivae normal ENMT: Mucous membranes are moist. Posterior pharynx clear of any exudate or lesions.Normal dentition.  Neck: normal, supple, no masses, no thyromegaly Respiratory: clear to auscultation bilaterally, no wheezing, no crackles. Normal respiratory effort. No accessory muscle use.  Cardiovascular: Irregularly irregular with tachycardia no murmurs / rubs / gallops.  2+ extremity edema. 2+ pedal pulses. No carotid bruits.  Abdomen: no tenderness, no masses palpated. No hepatosplenomegaly. Bowel sounds positive.  Musculoskeletal: Good range of motion, no joint swelling or tenderness, Skin: no rashes, lesions, ulcers. No induration Neurologic: CN 2-12 grossly intact. Sensation intact, DTR normal. Strength 5/5 in all 4.  Psychiatric: Normal judgment and insight. Alert and oriented x 3.  Anxious mood  Data Reviewed:  Temperature 98.4, blood pressure 155/95, pulse 112, respirate 23, oxygen sats 94% on room air.  CBC and chemistry appear to be within normal.  BNP 341.9.  Glucose 162.  Chest x-ray showed cardiomegaly without overt edema  Assessment and Plan:  #1 acute hypoxic respiratory failure: This seems to have improved with diuresis.  Patient most likely is having acute  diastolic heart failure.  Will also have to evaluate for possible COPD exacerbation  #2 coronary artery disease: Stable.  Continue to monitor  #3 paroxysmal atrial fibrillation with RVR: Given a dose of Cardizem prior to coming in.  Waiting room continue to monitor  #4 chronic diastolic heart failure: Continue diuresis and other medications.  #5 hypothyroidism: Continue with levothyroxine   #6 status post mitral valve repair: Continue to monitor  #7 GERD: Continue with PPIs    Advance Care Planning:   Code Status: Full Code   Consults: None  Family Communication: No family at bedside  Severity of Illness: The appropriate patient status for this patient is OBSERVATION. Observation status is judged to be reasonable and necessary in order to provide the required intensity of service to ensure the patient's safety. The patient's presenting symptoms, physical exam findings, and initial radiographic and laboratory data in the context of their medical condition is felt to place them at decreased risk for further clinical deterioration. Furthermore, it is anticipated that the patient will be medically stable for discharge from the hospital within 2 midnights of admission.   AuthorBETHA SIM KNOLL, MD 06/18/2024 12:31 AM  For on call review [email or web address].

## 2024-06-18 NOTE — Progress Notes (Signed)
 PROGRESS NOTE    Patient: Amy Fischer                            PCP: Charlett [name], MD                    DOB: September 08, 1929            DOA: 06/17/2024 FMW:[id number]             DOS: 06/18/2024, 11:22 AM   LOS: 0 days   Date of Service: The patient was seen and examined on 06/18/2024  Subjective:   The patient was seen and examined this morning. Hemodynamically stable. No issues overnight .  Brief Narrative:    Amy Fischer is a 89 y.o. female with medical history significant of diastolic dysfunction CHF, GERD, hyperlipidemia, osteoarthritis, osteoporosis, carotid artery stenosis, hypothyroidism, essential hypertension and esophageal spasm who was brought in from home secondary to sudden onset shortness of breath for about 2 hours.  She was having difficulty taking deep breath.  Especially with mild exertion.  Patient has reported history of COPD although not documented anywhere.  She has history of A-fib and when EMS arrived her heart rate was 140s to 160s in atrial fibrillation.  She was given 10 mg of Cardizem IV and the rate has dropped to between 80-100.  Patient also received pembrolizumab 150 cc from EMS.  In the ER she been evaluated and treated with a dose of IV Lasix .  Patient is no longer requiring oxygen but still weak.  Patient appears to be acute exacerbation of CHF versus COPD.    Asessment and Plan:  Acute hypoxic respiratory failure:  COPD and CHF excerbation  This seems to have improved with diuresis.  Patient most likely is having acute diastolic heart failure.  Will also have to evaluate for possible COPD exacerbation  Coronary artery disease: Stable.  Continue to monitor   Paroxysmal atrial fibrillation with RVR:  Given a dose of Cardizem prior to coming in.   Waiting room continue to monitor   Cronic diastolic heart failure: Continue diuresis and other medications.   Hypothyroidism: Continue with levothyroxine    Status post mitral valve repair:  Continue to monitor   GERD: Continue with PPIs   ------------------------------------------------------------------------------------------------------------- Nutritional status:  The patient's BMI is: Body mass index is 18.71 kg/m. I agree with the assessment and plan as outlined below: Nutrition Status:           Skin Assessment: ----------------------------------------------------------------------------------------------------------------  DVT prophylaxis:  apixaban  (ELIQUIS ) tablet 2.5 mg Start: 06/18/24 0215 apixaban  (ELIQUIS ) tablet 2.5 mg   Code Status:   Code Status: Full Code  Family Communication: Daughter present at bedside updated-Advance care planning has been discussed.   Admission status:   Status is: Inpatient Needing IV medication including IV Lasix  for acute heart failure, A-fib   Disposition: From  - home             Planning for discharge in 1-2 days: to home   Procedures:   No admission procedures for hospital encounter.   Antimicrobials:  Anti-infectives (From admission, onward)    None        Medication:   apixaban   2.5 mg Oral BID   cholecalciferol   1,000 Units Oral Daily   cyanocobalamin   1,000 mcg Oral Daily   ezetimibe   10 mg Oral Daily   folic acid   1 mg Oral Daily   furosemide   20 mg Intravenous Q12H   ipratropium  1 spray Each Nare TID   levalbuterol   0.63 mg Nebulization Q6H   levothyroxine   100 mcg Oral Q0600   melatonin  3 mg Oral QHS   Polyethyl Glycol-Propyl Glycol  1 drop Both Eyes BID   rOPINIRole   0.25 mg Oral QHS   rosuvastatin   2.5 mg Oral Daily   sodium chloride  flush  3 mL Intravenous Q12H    sodium chloride , acetaminophen , methocarbamol , ondansetron  (ZOFRAN ) IV, sodium chloride  flush   Objective:   Vitals:   06/18/24 1000 06/18/24 1010 06/18/24 1030 06/18/24 1100  BP: 117/78  126/68 (!) 116/100  Pulse: 70  73 78  Resp: 17  (!) 21 (!) 21  Temp:      TempSrc:      SpO2: 100% 97% 96% 100%   Weight:      Height:        Intake/Output Summary (Last 24 hours) at 06/18/2024 1122 Last data filed at 06/18/2024 0330 Gross per 24 hour  Intake --  Output 850 ml  Net -850 ml   Filed Weights   06/17/24 2233  Weight: 44.9 kg     Physical examination:   Constitution:  Alert, cooperative, no distress,  Appears calm and comfortable  Psychiatric:   Normal and stable mood and affect, cognition intact,   HEENT:        Normocephalic, PERRL, otherwise with in Normal limits  Chest:         Chest symmetric Cardio vascular:  S1/S2, RRR, No murmure, No Rubs or Gallops  pulmonary: Clear to auscultation bilaterally, respirations unlabored, negative wheezes / crackles Abdomen: Soft, non-tender, non-distended, bowel sounds,no masses, no organomegaly Muscular skeletal: Limited exam - in bed, able to move all 4 extremities,   Neuro: CNII-XII intact. , normal motor and sensation, reflexes intact  Extremities: ++2 pitting edema lower extremities, +2 pulses  Skin: Dry, warm to touch, negative for any Rashes, No open wounds Wounds: per nursing documentation   ------------------------------------------------------------------------------------------------------------------------------------------    LABs:     Latest Ref Rng & Units 06/17/2024   10:39 PM 03/09/2024   12:07 PM 11/13/2023    8:27 AM  CBC  WBC 4.0 - 10.5 K/uL 7.4  5.9  7.4   Hemoglobin 12.0 - 15.0 g/dL 86.8  85.8  84.6   Hematocrit 36.0 - 46.0 % 41.2  41.6  45.4   Platelets 150 - 400 K/uL 160  174.0  218       Latest Ref Rng & Units 06/17/2024   10:39 PM 03/09/2024   12:07 PM 11/13/2023    8:27 AM  CMP  Glucose 70 - 99 mg/dL 837  96  888   BUN 8 - 23 mg/dL 23  22  10    Creatinine 0.44 - 1.00 mg/dL 9.01  9.20  9.34   Sodium 135 - 145 mmol/L 142  141  139   Potassium 3.5 - 5.1 mmol/L 4.1  4.0  3.9   Chloride 98 - 111 mmol/L 103  105  107   CO2 22 - 32 mmol/L 26  29  23    Calcium  8.9 - 10.3 mg/dL 89.9  89.6  9.5   Total  Protein 6.5 - 8.1 g/dL   7.2   Total Bilirubin <1.2 mg/dL   0.9   Alkaline Phos 38 - 126 U/L   75   AST 15 - 41 U/L   19   ALT 0 - 44 U/L   14  Micro Results No results found for this or any previous visit (from the past 240 hours).  Radiology Reports DG Chest Portable 1 View Result Date: 06/17/2024 CLINICAL DATA:  Shortness of breath EXAM: PORTABLE CHEST 1 VIEW COMPARISON:  11/13/2023, 12/28/2022, chest CT 12/28/2022 FINDINGS: Post sternotomy changes. Valve prosthesis. Cardiomegaly. Diffuse chronic bronchitic changes. No pleural effusion or pneumothorax. Possible mild patchy infiltrate at the right base. IMPRESSION: 1. Cardiomegaly without overt edema 2. Chronic bronchitic changes. Atelectasis or scarring left base. Possible mild patchy infiltrate at the right base. Electronically Signed   By: Luke Bun M.D.   On: 06/17/2024 22:55    SIGNED: Adriana DELENA Grams, MD, FHM. FAAFP. Jolynn Pack - [facility] Time spent - 55 min.  In seeing, evaluating and examining the patient. Reviewing medical records, labs, drawn plan of care. [facility],  Pager (please use [email or web address] to page/ text) Please use Epic Secure Chat for non-urgent communication (7AM-7PM)  If 7PM-7AM, please contact night-coverage [email or web address], 06/18/2024, 11:22 AM

## 2024-06-18 NOTE — Consult Note (Signed)
 Cardiology Consultation   Patient ID: Amy Fischer MRN: [id number]; DOB: 13-Jun-1929  Admit date: 06/17/2024 Date of Consult: 06/18/2024  PCP:  Charlett [name], MD   [facility] Providers Cardiologist:  Redell Shallow, MD         History of Present Illness: Amy Fischer is a 89 year old female with history of paroxysmal atrial fibrillation following mitral valve repair on chronic anticoagulation with apixaban  dose adjusted 2.5 mg for age and weight here with 3 days of worsening lower extremity edema bilaterally with 1 day of worsening shortness of breath, inability to take in a deep breath she states.  No chest pain no fevers.  She feels cold in the emergency department however she has not had any rigors or chills.  No urinary dysfunction.  Her symptoms gradually came on over the last couple days.  When EMS arrived, she was found to be in atrial fibrillation with rapid ventricular response.  In the emergency department BNP was mildly elevated thyroid  functions were also mildly elevated.  Chest x-ray demonstrates cardiomegaly with no overt edema.  She was given IV Lasix  20 mg.  Good overall diuresis.  She was also given a dose of low-dose metoprolol .  We are changing this to metoprolol  tartrate 25 mg 4 times a day to help slow down atrial fibrillation.  Last echocardiogram in January 2024 showed normal ejection fraction of 70%.  Mild mitral regurgitation from valvular repair was previously noted as well as mild mitral stenosis.   Past Medical History:  Diagnosis Date   Allergy    Anemia    Asymptomatic carotid artery stenosis    R ICA 40% stenosis on CTA 12/2011.   Bladder polyps    Cataract    BILATERAL-REMOVED   CHF (congestive heart failure) (HCC)    Diverticulosis    Elevated blood pressure 03/25/2011   Bp readings borderline today has hx of elvation  In office and ok at home   Not checke recently   She gfeels was elevated from anxiety     Episodic recurrent  vertigo    MRI Head 2003   Esophageal spasm    GERD (gastroesophageal reflux disease)    Hepatic hemangioma    History of hepatitis    unknown type   Hyperlipidemia    Hyperplastic colon polyp    Hypothyroidism    Intestinal metaplasia of gastric mucosa    Osteoarthritis    Osteoporosis    Patellar fracture 07/13/2012   Scapular fracture 03/31/2012   small from direct blow with trip     Subclavian steal syndrome    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; CTA 2013 revealed open graft    Past Surgical History:  Procedure Laterality Date   APPENDECTOMY     CARDIAC CATHETERIZATION N/A 10/04/2015   Procedure: Right/Left Heart Cath and Coronary Angiography;  Surgeon: Victory LELON Sharps, MD;  Location: [facility] INVASIVE CV LAB;  Service: Cardiovascular;  Laterality: N/A;   CAROTID-SUBCLAVIAN BYPASS GRAFT Left 1999   for [place] steal syndrome   CHOLECYSTECTOMY     COLONOSCOPY     CYSTECTOMY Left    hand   ELBOW SURGERY Left    KNEE SURGERY Left 2014   MITRAL VALVE REPAIR N/A 10/10/2015   Procedure: MITRAL VALVE REPAIR (MVR) WITH SIZE 28 CARPENTIER-EDWARDS PHYSIO II ANNULOPLASTY RING;  Surgeon: Elspeth JAYSON Millers, MD;  Location: [facility] OR;  Service: Open Heart Surgery;  Laterality: N/A;   ROTATOR CUFF REPAIR Left    TEAR  DUCT PROBING  07/2014   TEE WITHOUT CARDIOVERSION N/A 10/03/2015   Procedure: TRANSESOPHAGEAL ECHOCARDIOGRAM (TEE);  Surgeon: Ezra GORMAN Shuck, MD;  Location: [facility] ENDOSCOPY;  Service: Cardiovascular;  Laterality: N/A;   TEE WITHOUT CARDIOVERSION N/A 10/10/2015   Procedure: TRANSESOPHAGEAL ECHOCARDIOGRAM (TEE);  Surgeon: Elspeth JAYSON Millers, MD;  Location: [facility] OR;  Service: Open Heart Surgery;  Laterality: N/A;   TONSILLECTOMY     TUBAL LIGATION       Home Medications:  Prior to Admission medications   Medication Sig Start Date End Date Taking? Authorizing Provider  albuterol  (VENTOLIN  HFA) 108 (90 Base) MCG/ACT inhaler Inhale 2 puffs into the lungs every 6 (six) hours as needed  for wheezing or shortness of breath. 12/24/21  Yes Kozlow, Camellia PARAS, MD  apixaban  (ELIQUIS ) 2.5 MG TABS tablet TAKE 1 TABLET TWICE A DAY 05/05/23  Yes Pietro Redell GORMAN, MD  cholecalciferol  (VITAMIN D ) 1000 units tablet Take 1,000 Units by mouth daily.   Yes [provider]  ezetimibe  (ZETIA ) 10 MG tablet TAKE 1 TABLET DAILY 08/04/23  Yes Panosh, Wanda K, MD  folic acid  (FOLVITE ) 1 MG tablet TAKE 1 TABLET DAILY 08/13/23  Yes Panosh, Wanda K, MD  furosemide  (LASIX ) 20 MG tablet Take one tablet as needed for shortness of breathe and leg edema 07/10/23  Yes Meng, Hao, PA  ipratropium (ATROVENT ) 0.06 % nasal spray USE 1 SPRAY IN EACH NOSTRIL TWICE A DAY AS NEEDED FOR RHINITIS 04/27/23  Yes Webb, Padonda B, FNP  levothyroxine  (SYNTHROID ) 100 MCG tablet TAKE 1 TABLET EVERY MORNING ON AN EMPTY STOMACH Patient taking differently: No sig reported 10/14/23  Yes Douglass Caul B, FNP  melatonin 5 MG TABS Take 5 mg by mouth at bedtime.   Yes [provider]  Polyethyl Glycol-Propyl Glycol 0.4-0.3 % SOLN Place 1 drop into both eyes 2 (two) times daily.   Yes [provider]  rosuvastatin  (CRESTOR ) 5 MG tablet Take 0.5 tablets (2.5 mg total) by mouth daily. 08/03/23  Yes Panosh, Wanda K, MD  SALINE NASAL SPRAY NA Place 1 spray into both nostrils in the morning and at bedtime.   Yes [provider]  vitamin B-12 (CYANOCOBALAMIN ) 1000 MCG tablet Take 1,000 mcg by mouth daily.   Yes [provider]    Scheduled Meds:  apixaban   2.5 mg Oral BID   cholecalciferol   1,000 Units Oral Daily   cyanocobalamin   1,000 mcg Oral Daily   ezetimibe   10 mg Oral Daily   folic acid   1 mg Oral Daily   furosemide   20 mg Intravenous Q12H   ipratropium  1 spray Each Nare TID   levalbuterol   0.63 mg Nebulization Q6H   levothyroxine   100 mcg Oral Q0600   melatonin  3 mg Oral QHS   metoprolol  succinate  12.5 mg Oral Daily   Polyethyl Glycol-Propyl Glycol  1 drop Both Eyes BID   rOPINIRole   0.25  mg Oral QHS   rosuvastatin   2.5 mg Oral Daily   sacubitril -valsartan   1 tablet Oral BID   sodium chloride  flush  3 mL Intravenous Q12H   Continuous Infusions:  sodium chloride      PRN Meds: sodium chloride , acetaminophen , methocarbamol , ondansetron  (ZOFRAN ) IV, sodium chloride  flush  Allergies:    Allergies  Allergen Reactions   Codeine Nausea And Vomiting   Risedronate Sodium     Upset stomach. Could take Fosamax .   Statins     Muscles hurt, can take low dose    Tape Other (  See Comments)    Blisters, Please use paper tape   Amoxicillin -Pot Clavulanate Diarrhea    Not allergic  2014, Pt can take z pack only     Social History:   Social History   Socioeconomic History   Marital status: Widowed    Spouse name: Not on file   Number of children: 4   Years of education: Not on file   Highest education level: Bachelor's degree (e.g., BA, AB, BS)  Occupational History   Occupation: retired Runner, broadcasting/film/video  Tobacco Use   Smoking status: Former    Current packs/day: 0.00    Average packs/day: 0.8 packs/day for 20.0 years (15.0 ttl pk-yrs)    Types: Cigarettes    Start date: 01/05/1971    Quit date: 01/05/1991    Years since quitting: 33.4   Smokeless tobacco: Never  Vaping Use   Vaping status: Never Used  Substance and Sexual Activity   Alcohol  use: Not Currently    Alcohol /week: 4.0 standard drinks of alcohol     Types: 4 Glasses of wine per week    Comment: socially   Drug use: No   Sexual activity: Not on file  Other Topics Concern   Not on file  Social History Narrative   Widowed.  Lives alone in a one story home.  Has 4 children (3 living).  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    HH of 1    No pets   Former smoker   Exercises regularly   Right handed   Social Drivers of Corporate investment banker Strain: Low Risk  (01/04/2024)   Overall Financial Resource Strain (CARDIA)    Difficulty of Paying Living Expenses: Not hard at all  Food Insecurity: No Food Insecurity  (01/04/2024)   Hunger Vital Sign    Worried About Running Out of Food in the Last Year: Never true    Ran Out of Food in the Last Year: Never true  Transportation Needs: No Transportation Needs (01/04/2024)   PRAPARE - Administrator, Civil Service (Medical): No    Lack of Transportation (Non-Medical): No  Physical Activity: Insufficiently Active (01/04/2024)   Exercise Vital Sign    Days of Exercise per Week: 2 days    Minutes of Exercise per Session: 60 min  Stress: No Stress Concern Present (01/04/2024)   [employer] of Occupational Health - Occupational Stress Questionnaire    Feeling of Stress : Not at all  Social Connections: Moderately Integrated (09/03/2021)   Social Connection and Isolation Panel    Frequency of Communication with Friends and Family: More than three times a week    Frequency of Social Gatherings with Friends and Family: Three times a week    Attends Religious Services: More than 4 times per year    Active Member of Clubs or Organizations: Yes    Attends Banker Meetings: 1 to 4 times per year    Marital Status: Widowed  Intimate Partner Violence: Not At Risk (01/04/2024)   Humiliation, Afraid, Rape, and Kick questionnaire    Fear of Current or Ex-Partner: No    Emotionally Abused: No    Physically Abused: No    Sexually Abused: No    Family History:    Family History  Problem Relation Age of Onset   Hypertension Mother    Stroke Mother    Heart disease Father    Colon cancer Neg Hx    Neurofibromatosis Neg Hx    Allergic rhinitis Neg Hx  Asthma Neg Hx    Angioedema Neg Hx    Atopy Neg Hx    Eczema Neg Hx    Immunodeficiency Neg Hx    Urticaria Neg Hx      ROS:  Please see the history of present illness.   All other ROS reviewed and negative.     Physical Exam/Data: Vitals:   06/18/24 1130 06/18/24 1142 06/18/24 1145 06/18/24 1200  BP: 111/65  (!) 130/116 (!) 114/100  Pulse: 97  (!) 117 (!) 119  Resp: 20  (!) 21 19   Temp:  98.1 F (36.7 C)    TempSrc:  Oral    SpO2: 94%  95% 98%  Weight:      Height:        Intake/Output Summary (Last 24 hours) at 06/18/2024 1330 Last data filed at 06/18/2024 1142 Gross per 24 hour  Intake --  Output 1550 ml  Net -1550 ml      06/17/2024   10:33 PM 03/09/2024   11:16 AM 01/04/2024    4:13 PM  Last 3 Weights  Weight (lbs) 99 lb 98 lb 12.8 oz 102 lb  Weight (kg) 44.906 kg 44.815 kg 46.267 kg     Body mass index is 18.71 kg/m.  General:  Thin in no distress, eating lunch HEENT: normal Neck: no JVD Vascular: No carotid bruits; Distal pulses 2+ bilaterally Cardiac: Irregularly irregular mildly tachycardic with heart rates on telemetry ranging from 110 to 130 bpm, no appreciable loud murmurs Lungs: Mild crackles heard at bases  abd: soft, nontender, no hepatomegaly  Ext: 2+ bilateral lower extremity edema Musculoskeletal:  No deformities, BUE and BLE strength normal and equal Skin: warm and dry  Neuro:  CNs 2-12 intact, no focal abnormalities noted Psych:  Normal affect   EKG:  The EKG was personally reviewed and demonstrates: Atrial fibrillation with rapid ventricular response Telemetry:  Telemetry was personally reviewed and demonstrates: Atrial flutter with rapid ventricular response 100 and 120 currently  Relevant CV Studies: Prior echocardiogram as above  Laboratory Data: High Sensitivity Troponin:  No results for input(s): TROPONINIHS in the last 720 hours.   Chemistry Recent Labs  Lab 06/17/24 2239  NA 142  K 4.1  CL 103  CO2 26  GLUCOSE 162*  BUN 23  CREATININE 0.98  CALCIUM  10.0  GFRNONAA 54*  ANIONGAP 13    No results for input(s): PROT, ALBUMIN , AST, ALT, ALKPHOS, BILITOT in the last 168 hours. Lipids No results for input(s): CHOL, TRIG, HDL, LABVLDL, LDLCALC, CHOLHDL in the last 168 hours.  Hematology Recent Labs  Lab 06/17/24 2239  WBC 7.4  RBC 3.97  HGB 13.1  HCT 41.2  MCV 103.8*  MCH 33.0   MCHC 31.8  RDW 14.2  PLT 160   Thyroid   Recent Labs  Lab 06/17/24 2239 06/18/24 0930  TSH 8.721*  --   FREET4  --  1.24*    BNP Recent Labs  Lab 06/17/24 2239  BNP 341.9*    DDimer No results for input(s): DDIMER in the last 168 hours.  Radiology/Studies:  DG Chest Portable 1 View Result Date: 06/17/2024 CLINICAL DATA:  Shortness of breath EXAM: PORTABLE CHEST 1 VIEW COMPARISON:  11/13/2023, 12/28/2022, chest CT 12/28/2022 FINDINGS: Post sternotomy changes. Valve prosthesis. Cardiomegaly. Diffuse chronic bronchitic changes. No pleural effusion or pneumothorax. Possible mild patchy infiltrate at the right base. IMPRESSION: 1. Cardiomegaly without overt edema 2. Chronic bronchitic changes. Atelectasis or scarring left base. Possible mild patchy infiltrate at the  right base. Electronically Signed   By: Luke Bun M.D.   On: 06/17/2024 22:55     Assessment and Plan:  89 year old with prior mitral valve repair, postoperative atrial fibrillation previously on brief amiodarone , on chronic Eliquis  2.5 mg twice a day here with worsening lower extremity edema and shortness of breath compatible with acute diastolic heart failure in the setting of atrial flutter with rapid ventricular response.  Atrial flutter with rapid ventricular response - Continue with Eliquis  2.5 mg twice a day.  She has not missed any dosing. -At home she is not taking any AV nodal blocking agents. -She was given metoprolol  succinate 12.5 mg once a day, I will change this to metoprolol  tartrate 25 mg 4 times a day to help improve heart rate.  We will have hold parameters on this. -Agree with Lasix  20 mg IV every 12 hours. -If she does not auto convert, we can consider cardioversion Monday.  Acute diastolic heart failure - Likely exacerbated by atrial fibrillation.  About 3 to 4 days ago started to develop lower extremity edema and then shortness of breath thereafter.  A-fib noted. - Lasix , metoprolol . - I am  also fine with her continuing her Entresto  24/26 twice a day.  Post mitral valve repair - We will check echocardiogram.  I do not appreciate any loud murmurs.  Mild hyperthyroidism - TSH 8.7 and free T4 -1.2 Hemoglobin 13.1 BNP 341 creatinine 0.98 potassium 4.1  Risk Assessment/Risk Scores:      New York  Heart Association (NYHA) Functional Class NYHA Class III  CHA2DS2-VASc Score = 5   This indicates a 7.2% annual risk of stroke. The patient's score is based upon: CHF History: 0 HTN History: 1 Diabetes History: 0 Stroke History: 0 Vascular Disease History: 1 Age Score: 2 Gender Score: 1        For questions or updates, please contact [facility] Please consult [email or web address] for contact info under    Signed, Oneil Parchment, MD  06/18/2024 1:30 PM

## 2024-06-18 NOTE — Hospital Course (Addendum)
 Amy Fischer is a 89 y.o. female with medical history significant of diastolic dysfunction CHF, GERD, hyperlipidemia, osteoarthritis, osteoporosis, carotid artery stenosis, hypothyroidism, essential hypertension and esophageal spasm who was brought in from home secondary to sudden onset shortness of breath for about 2 hours.  She was having difficulty taking deep breath.  Especially with mild exertion.  Patient has reported history of COPD although not documented anywhere.  She has history of A-fib and when EMS arrived her heart rate was 140s to 160s in atrial fibrillation.  She was given 10 mg of Cardizem IV and the rate has dropped to between 80-100.  Patient also received pembrolizumab 150 cc from EMS.  In the ER she been evaluated and treated with a dose of IV Lasix .  Patient is no longer requiring oxygen but still weak.  Patient appears to be acute exacerbation of CHF versus COPD.     Assessment & Plan:   Principal Problem:   Acute hypoxic respiratory failure (HCC) Active Problems:   Acute on chronic diastolic congestive heart failure, NYHA class 3 (HCC)   Paroxysmal atrial fibrillation (HCC)   Hypothyroidism   S/P MVR (mitral valve repair)   GERD   PAF (paroxysmal atrial fibrillation) (HCC)   Chronic diastolic CHF (congestive heart failure), NYHA class 2 (HCC)   CAD (coronary artery disease)   COPD with acute exacerbation (HCC)   Acute hypoxic respiratory failure:  Likely exacerbated by CHF exacerbation, and COPD - Managing underlying causes - Continue diuretics - Monitoring I's and O's, daily weights, labs, BMP - Symptomatic improvement with diuretics - Pending echo   Intake/Output Summary (Last 24 hours) at 06/18/2024 1124 Last data filed at 06/18/2024 0330 Gross per 24 hour  Intake --  Output 850 ml  Net -850 ml   Filed Weights   06/17/24 2233  Weight: 44.9 kg     Coronary artery disease: Stable.  Continue to monitor   Paroxysmal atrial fibrillation with RVR:   Given a dose of Cardizem prior to coming in.  - Continue Eliquis  -Again adding small dose beta-blocker with Toprol   -Will continue to monitor   Cronic diastolic heart failure:  Diuretics as above Pending echo -Due to patient's age, soft BP -likely not a candidate for full Heart failure treatment   Hypothyroidism: Continue with levothyroxine    Status post mitral valve repair: Continue to monitor   GERD: Continue with PPIs

## 2024-06-18 NOTE — Consult Note (Signed)
 Consultation Note Date: 06/18/2024   Patient Name: Amy Fischer  DOB: [birth year]  MRN: [id number]  Age / Sex: 89 y.o., female  PCP: Charlett [name], MD Referring Physician: Willette Adriana LABOR, MD  Reason for Consultation: Establishing goals of care  HPI/Patient Profile: 89 y.o. female   admitted on 06/17/2024 with past   medical history significant of diastolic dysfunction CHF, GERD, hyperlipidemia, osteoarthritis, osteoporosis, carotid artery stenosis, hypothyroidism, essential hypertension and esophageal spasm who was brought in from home secondary to sudden onset shortness of breath for about 2 hours.  She was having difficulty taking deep breath.  Especially with mild exertion.    Patient has reported history of COPD although not documented anywhere.  She has history of A-fib and when EMS arrived her heart rate was 140s to 160s in atrial fibrillation.     Patient admitted for treatment and stabilization   Patient and family face treatment option decisions, advanced directive decisions and anticipatory care needs.  Clinical Assessment and Goals of Care:  This NP Ronal Plants reviewed medical records, received report from team, assessed the patient and then meet at the patient's bedside along with her daughter/ Mliss Ee to discuss diagnosis, prognosis, GOC, EOL wishes disposition and options.   Concept of Palliative Care was introduced as specialized medical care for people and their families living with serious illness.  If focuses on providing relief from the symptoms and stress of a serious illness.  The goal is to improve quality of life for both the patient and the family.  Values and goals of care important to patient and family were attempted to be elicited.  Created space and opportunity for patient  and family to explore thoughts and feelings regarding current medical situation.  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  She has 2 children, multiple grandchildren and great-grandchildren  Patient tells me that she lives alone.  Although limited, patient continues to drive.  She plays cards twice a month with her friends and continues to have physical therapy twice a week in the home.  Her PCP is Dr. Charlett. Patient does recognize slow continued physical and functional decline over the past few years with history of falls   A  discussion was had today regarding advanced directives.  Concepts specific to code status,  and rehospitalization was had.    Detailed discussion regarding CODE STATUS specific to the details of DNR/DNI when the patient's heart has stopped beating and when they stop breathing.  Patient does want to continue to treat the treatable including IV fluids, medications , cardiac monitoring, IV antibiotics, medical diagnostics--she is hopeful for continued improvement and discharged home when medically stable  If however her heart and lung stop she does not want CPR, or intubation.  DNR-limited order placed.   Daughter present at bedside supports her mother stated wishes     Questions and concerns addressed.  Patient  encouraged to call with questions or concerns.     PMT  will continue to support holistically.             HCPOA--patient and family believe there is documentation.  They will bring in from home for scanning into EMR    SUMMARY OF RECOMMENDATIONS    Code Status/Advance Care Planning: Limited code   Symptom Management:  - Per attending  Palliative Prophylaxis:  Delirium Protocol and Frequent Pain Assessment  Additional Recommendations (Limitations, Scope, Preferences): Avoid Hospitalization and Full Scope Treatment  Psycho-social/Spiritual:  Desire for further Chaplaincy support:no   Prognosis:  Unable to determine  Discharge Planning: To Be Determined      Primary Diagnoses: Present on  Admission:  Paroxysmal atrial fibrillation (HCC)  Hypothyroidism  GERD  PAF (paroxysmal atrial fibrillation) (HCC)  Chronic diastolic CHF (congestive heart failure), NYHA class 2 (HCC)  CAD (coronary artery disease)  Acute on chronic diastolic congestive heart failure, NYHA class 3 (HCC)  COPD with acute exacerbation (HCC)  Acute hypoxic respiratory failure (HCC)   I have reviewed the medical record, interviewed the patient and family, and examined the patient. The following aspects are pertinent.  Past Medical History:  Diagnosis Date   Allergy    Anemia    Asymptomatic carotid artery stenosis    R ICA 40% stenosis on CTA 12/2011.   Bladder polyps    Cataract    BILATERAL-REMOVED   CHF (congestive heart failure) (HCC)    Diverticulosis    Elevated blood pressure 03/25/2011   Bp readings borderline today has hx of elvation  In office and ok at home   Not checke recently   She gfeels was elevated from anxiety     Episodic recurrent vertigo    MRI Head 2003   Esophageal spasm    GERD (gastroesophageal reflux disease)    Hepatic hemangioma    History of hepatitis    unknown type   Hyperlipidemia    Hyperplastic colon polyp    Hypothyroidism    Intestinal metaplasia of gastric mucosa    Osteoarthritis    Osteoporosis    Patellar fracture 07/13/2012   Scapular fracture 03/31/2012   small from direct blow with trip     Subclavian steal syndrome    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; CTA 2013 revealed open graft   Social History   Socioeconomic History   Marital status: Widowed    Spouse name: Not on file   Number of children: 4   Years of education: Not on file   Highest education level: Bachelor's degree (e.g., BA, AB, BS)  Occupational History   Occupation: retired Runner, broadcasting/film/video  Tobacco Use   Smoking status: Former    Current packs/day: 0.00    Average packs/day: 0.8 packs/day for 20.0 years (15.0 ttl pk-yrs)    Types: Cigarettes    Start date: 01/05/1971     Quit date: 01/05/1991    Years since quitting: 33.4   Smokeless tobacco: Never  Vaping Use   Vaping status: Never Used  Substance and Sexual Activity   Alcohol  use: Not Currently    Alcohol /week: 4.0 standard drinks of alcohol     Types: 4 Glasses of wine per week    Comment: socially   Drug use: No   Sexual activity: Not on file  Other Topics Concern   Not on file  Social History Narrative   Widowed.  Lives alone in a one story home.  Has 4 children (3 living).  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    HH of 1  No pets   Former smoker   Exercises regularly   Right handed   Social Drivers of Health   Financial Resource Strain: Low Risk  (01/04/2024)   Overall Financial Resource Strain (CARDIA)    Difficulty of Paying Living Expenses: Not hard at all  Food Insecurity: No Food Insecurity (01/04/2024)   Hunger Vital Sign    Worried About Running Out of Food in the Last Year: Never true    Ran Out of Food in the Last Year: Never true  Transportation Needs: No Transportation Needs (01/04/2024)   PRAPARE - Administrator, Civil Service (Medical): No    Lack of Transportation (Non-Medical): No  Physical Activity: Insufficiently Active (01/04/2024)   Exercise Vital Sign    Days of Exercise per Week: 2 days    Minutes of Exercise per Session: 60 min  Stress: No Stress Concern Present (01/04/2024)   [employer] of Occupational Health - Occupational Stress Questionnaire    Feeling of Stress : Not at all  Social Connections: Moderately Integrated (09/03/2021)   Social Connection and Isolation Panel    Frequency of Communication with Friends and Family: More than three times a week    Frequency of Social Gatherings with Friends and Family: Three times a week    Attends Religious Services: More than 4 times per year    Active Member of Clubs or Organizations: Yes    Attends Banker Meetings: 1 to 4 times per year    Marital Status: Widowed   Family History  Problem  Relation Age of Onset   Hypertension Mother    Stroke Mother    Heart disease Father    Colon cancer Neg Hx    Neurofibromatosis Neg Hx    Allergic rhinitis Neg Hx    Asthma Neg Hx    Angioedema Neg Hx    Atopy Neg Hx    Eczema Neg Hx    Immunodeficiency Neg Hx    Urticaria Neg Hx    Scheduled Meds:  apixaban   2.5 mg Oral BID   cholecalciferol   1,000 Units Oral Daily   cyanocobalamin   1,000 mcg Oral Daily   ezetimibe   10 mg Oral Daily   folic acid   1 mg Oral Daily   furosemide   20 mg Intravenous Q12H   ipratropium  1 spray Each Nare TID   levalbuterol   0.63 mg Nebulization Q6H   levothyroxine   100 mcg Oral Q0600   melatonin  3 mg Oral QHS   Polyethyl Glycol-Propyl Glycol  1 drop Both Eyes BID   rOPINIRole   0.25 mg Oral QHS   rosuvastatin   2.5 mg Oral Daily   sodium chloride  flush  3 mL Intravenous Q12H   Continuous Infusions:  sodium chloride      PRN Meds:.sodium chloride , acetaminophen , methocarbamol , ondansetron  (ZOFRAN ) IV, sodium chloride  flush Medications Prior to Admission:  Prior to Admission medications   Medication Sig Start Date End Date Taking? Authorizing Provider  albuterol  (VENTOLIN  HFA) 108 (90 Base) MCG/ACT inhaler Inhale 2 puffs into the lungs every 6 (six) hours as needed for wheezing or shortness of breath. 12/24/21  Yes Kozlow, Eric J, MD  apixaban  (ELIQUIS ) 2.5 MG TABS tablet TAKE 1 TABLET TWICE A DAY 05/05/23  Yes Pietro Redell RAMAN, MD  cholecalciferol  (VITAMIN D ) 1000 units tablet Take 1,000 Units by mouth daily.   Yes [provider]  ezetimibe  (ZETIA ) 10 MG tablet TAKE 1 TABLET DAILY 08/04/23  Yes Panosh, Wanda K, MD  folic  acid (FOLVITE ) 1 MG tablet TAKE 1 TABLET DAILY 08/13/23  Yes Panosh, Wanda K, MD  furosemide  (LASIX ) 20 MG tablet Take one tablet as needed for shortness of breathe and leg edema 07/10/23  Yes Meng, Hao, PA  ipratropium (ATROVENT ) 0.06 % nasal spray USE 1 SPRAY IN EACH NOSTRIL TWICE A DAY AS NEEDED FOR RHINITIS 04/27/23  Yes  Webb, Padonda B, FNP  levothyroxine  (SYNTHROID ) 100 MCG tablet TAKE 1 TABLET EVERY MORNING ON AN EMPTY STOMACH Patient taking differently: No sig reported 10/14/23  Yes Douglass Caul B, FNP  melatonin 5 MG TABS Take 5 mg by mouth at bedtime.   Yes [provider]  Polyethyl Glycol-Propyl Glycol 0.4-0.3 % SOLN Place 1 drop into both eyes 2 (two) times daily.   Yes [provider]  rosuvastatin  (CRESTOR ) 5 MG tablet Take 0.5 tablets (2.5 mg total) by mouth daily. 08/03/23  Yes Panosh, Wanda K, MD  SALINE NASAL SPRAY NA Place 1 spray into both nostrils in the morning and at bedtime.   Yes [provider]  vitamin B-12 (CYANOCOBALAMIN ) 1000 MCG tablet Take 1,000 mcg by mouth daily.   Yes [provider]   Allergies  Allergen Reactions   Codeine Nausea And Vomiting   Risedronate Sodium     Upset stomach. Could take Fosamax .   Statins     Muscles hurt, can take low dose    Tape Other (See Comments)    Blisters, Please use paper tape   Amoxicillin -Pot Clavulanate Diarrhea    Not allergic  2014, Pt can take z pack only    Review of Systems  Respiratory:  Positive for shortness of breath.   Neurological:  Positive for weakness.    Physical Exam  Cardiovascular:     Rate and Rhythm: Tachycardia present.  Pulmonary:     Effort: Pulmonary effort is normal.   Musculoskeletal:     Comments: Generalized weakness   Skin:    General: Skin is warm and dry.   Neurological:     Mental Status: She is alert and oriented to person, place, and time.     Vital Signs: BP (!) 137/103   Pulse (!) 135   Temp 98 F (36.7 C)   Resp (!) 21   Ht 5' 1 (1.549 m)   Wt 44.9 kg   SpO2 98%   BMI 18.71 kg/m  Pain Scale: 0-10   Pain Score: 0-No pain   SpO2: SpO2: 98 % O2 Device:SpO2: 98 % O2 Flow Rate: .   IO: Intake/output summary:  Intake/Output Summary (Last 24 hours) at 06/18/2024 0956 Last data filed at 06/18/2024 0330 Gross per 24 hour  Intake --   Output 850 ml  Net -850 ml    LBM:   Baseline Weight: Weight: 44.9 kg Most recent weight: Weight: 44.9 kg     Palliative Assessment/Data:   PTA- 80 %     Time: 75 minutes   Signed by: Ronal Plants, NP   Please contact Palliative Medicine Team phone at [phone] for questions and concerns.  For individual provider: See Tracey

## 2024-06-19 DIAGNOSIS — I2510 Atherosclerotic heart disease of native coronary artery without angina pectoris: Secondary | ICD-10-CM

## 2024-06-19 DIAGNOSIS — J441 Chronic obstructive pulmonary disease with (acute) exacerbation: Secondary | ICD-10-CM

## 2024-06-19 DIAGNOSIS — J9601 Acute respiratory failure with hypoxia: Secondary | ICD-10-CM | POA: Diagnosis not present

## 2024-06-19 DIAGNOSIS — I480 Paroxysmal atrial fibrillation: Secondary | ICD-10-CM

## 2024-06-19 LAB — BASIC METABOLIC PANEL WITH GFR
Anion gap: 13 (ref 5–15)
BUN: 21 mg/dL (ref 8–23)
CO2: 26 mmol/L (ref 22–32)
Calcium: 9.1 mg/dL (ref 8.9–10.3)
Chloride: 102 mmol/L (ref 98–111)
Creatinine, Ser: 1.03 mg/dL — ABNORMAL HIGH (ref 0.44–1.00)
GFR, Estimated: 51 mL/min — ABNORMAL LOW (ref >60)
Glucose, Bld: 104 mg/dL — ABNORMAL HIGH (ref 70–99)
Potassium: 3.8 mmol/L (ref 3.5–5.1)
Sodium: 141 mmol/L (ref 135–145)

## 2024-06-19 LAB — BRAIN NATRIURETIC PEPTIDE: B Natriuretic Peptide: 588 pg/mL — ABNORMAL HIGH (ref 0.0–100.0)

## 2024-06-19 LAB — MAGNESIUM: Magnesium: 2 mg/dL (ref 1.7–2.4)

## 2024-06-19 LAB — TROPONIN I (HIGH SENSITIVITY)
Troponin I (High Sensitivity): 28 ng/L — ABNORMAL HIGH (ref <18)
Troponin I (High Sensitivity): 33 ng/L — ABNORMAL HIGH (ref <18)

## 2024-06-19 MED ORDER — FUROSEMIDE 40 MG PO TABS
40.0000 mg | ORAL_TABLET | Freq: Every day | ORAL | Status: DC
  Administered 2024-06-19: 40 mg via ORAL
  Filled 2024-06-19: qty 1

## 2024-06-19 MED ORDER — METOPROLOL SUCCINATE ER 50 MG PO TB24
50.0000 mg | ORAL_TABLET | Freq: Every day | ORAL | Status: DC
  Filled 2024-06-19: qty 1

## 2024-06-19 MED ORDER — OFF THE BEAT BOOK
Freq: Once | Status: AC
  Filled 2024-06-19: qty 1

## 2024-06-19 MED ORDER — MIDODRINE HCL 5 MG PO TABS
2.5000 mg | ORAL_TABLET | Freq: Three times a day (TID) | ORAL | Status: DC
  Administered 2024-06-19 (×2): 2.5 mg via ORAL
  Filled 2024-06-19 (×2): qty 1

## 2024-06-19 MED ORDER — METOPROLOL TARTRATE 25 MG PO TABS: 25.0000 mg | ORAL_TABLET | Freq: Two times a day (BID) | ORAL | Status: DC

## 2024-06-19 MED ORDER — MELATONIN 5 MG PO TABS
5.0000 mg | ORAL_TABLET | Freq: Every day | ORAL | Status: DC
  Administered 2024-06-19 – 2024-06-20 (×2): 5 mg via ORAL
  Filled 2024-06-19 (×2): qty 1

## 2024-06-19 MED ORDER — FUROSEMIDE 20 MG PO TABS
20.0000 mg | ORAL_TABLET | Freq: Every day | ORAL | Status: DC
  Administered 2024-06-20 – 2024-06-21 (×2): 20 mg via ORAL
  Filled 2024-06-19 (×2): qty 1

## 2024-06-19 MED ORDER — MORPHINE SULFATE (PF) 2 MG/ML IV SOLN
1.0000 mg | Freq: Once | INTRAVENOUS | Status: AC
  Administered 2024-06-19: 1 mg via INTRAVENOUS
  Filled 2024-06-19: qty 1

## 2024-06-19 MED ORDER — METOPROLOL SUCCINATE ER 25 MG PO TB24
25.0000 mg | ORAL_TABLET | Freq: Every day | ORAL | Status: DC
  Administered 2024-06-20: 25 mg via ORAL
  Filled 2024-06-19: qty 1

## 2024-06-19 NOTE — Progress Notes (Signed)
 Patient ID: Amy Fischer, female   DOB: Dec 26, 1929, 89 y.o.   MRN: [id number]    Progress Note from the Palliative Medicine Team at [facility]   Patient Name: Amy Fischer        Date: 06/19/2024 DOB: 03/10/1929  Age: 89 y.o. MRN#: [id number] Attending Physician: Willette Adriana LABOR, MD Primary Care Physician: Charlett [name], MD Admit Date: 06/17/2024   Reason for Consultation/Follow-up   Establishing Goals of Care   HPI/ Brief Hospital Review  89 y.o. female   admitted on 06/17/2024 with past   medical history significant of diastolic dysfunction CHF, GERD, hyperlipidemia, osteoarthritis, osteoporosis, carotid artery stenosis, hypothyroidism, essential hypertension and esophageal spasm who was brought in from home secondary to sudden onset shortness of breath for about 2 hours.  She was having difficulty taking deep breath.  Especially with mild exertion.     Patient has reported history of COPD although not documented anywhere.  She has history of A-fib and when EMS arrived her heart rate was 140s to 160s in atrial fibrillation.    Patient admitted for treatment and stabilization  Patient and family face treatment option decisions, advanced directive decisions and anticipatory care needs.   Subjective  Extensive chart review has been completed prior to meeting with patient/family  including labs, vital signs, imaging, progress/consult notes, orders, medications and available advance directive documents.    This NP assessed patient at the bedside as a follow up to  yesterday's GOCs meeting.  Daughter at bedside  Patient reports episode last night of chest pain, relieved by low-dose morphine . Patient and family waiting for update from cardiology.  Education offered on the significance of patient's cardiac and pulmonary history.  Patient understands that her body is getting tired.  She is hopeful for medical management of her chronic diseases.  Ultimately her hope is to  discharge home and return to her activities of daily living.   We discussed her increased risk of decompensation into the future.  Education offered today regarding  the importance of continued conversation with family and the  medical providers regarding overall plan of care and treatment options,  ensuring decisions are within the context of the patients values and GOCs.  Questions and concerns addressed   Discussed with primary team and nursing staff   Time: 25 minutes  Detailed review of medical records ( labs, imaging, vital signs), medically appropriate exam ( MS, skin, cardiac,  resp)   discussed with treatment team, counseling and education to patient, family, staff, documenting clinical information, medication management, coordination of care    Ronal Plants NP  Palliative Medicine Team Team Phone # [phone] Pager [phone]

## 2024-06-19 NOTE — Evaluation (Signed)
 Physical Therapy Evaluation Patient Details Name: Amy Fischer MRN: [id number] DOB: 1929/10/20 Today's Date: 06/19/2024  History of Present Illness  Amy Fischer is a 89 y.o. female admitted 06/17/24 for acute exacerbation of CHF versus COPD. Found to be in atrial fibrillation with rapid ventricular response. PMHx: diastolic dysfunction CHF, GERD, HLD, OA, osteoporosis, carotid artery stenosis, hypothyroidism, essential hypertension, and esophageal spasm.   Clinical Impression  Pt admitted with above diagnosis. PTA, pt was modI for functional mobility using a walker and independent with ADLs/IADLs. She lives alone in a townhouse with 1 STE and L handrail. Pt reports currently receiving HHPT services 2x/week. Pt currently with functional limitations due to the deficits listed below (see PT Problem List). She required CGA for safety with all functional mobility. Pt ambulated ~239ft using RW. She demonstrated some mild unsteadiness as she fatigued, but had no LOB. Pt will benefit from acute skilled PT to increase her independence and safety with mobility to allow discharge. Recommend continue HHPT services with emphasis on improving cardiopulmonary endurance, decreasing fall risk, and optimizing safety within the home environment.      If plan is discharge home, recommend the following: A little help with walking and/or transfers;A little help with bathing/dressing/bathroom;Assistance with cooking/housework;Assist for transportation;Help with stairs or ramp for entrance   Can travel by private vehicle        Equipment Recommendations None recommended by PT (Pt already has DME)  Recommendations for Other Services       Functional Status Assessment Patient has had a recent decline in their functional status and demonstrates the ability to make significant improvements in function in a reasonable and predictable amount of time.     Precautions / Restrictions Precautions Precautions:  Fall Recall of Precautions/Restrictions: Intact Precaution/Restrictions Comments: watch HR Restrictions Weight Bearing Restrictions Per Provider Order: No      Mobility  Bed Mobility               General bed mobility comments: Not assessed. Pt greeted seated EOB and returned there at end of session.    Transfers Overall transfer level: Needs assistance Equipment used: Rolling walker (2 wheels) Transfers: Sit to/from Stand Sit to Stand: Contact guard assist           General transfer comment: Pt stood from lowest bed height. She demonstrated proper hand placement using RW. Pt powered up without physical assist. CGA for safety/stability. Good eccentric control.    Ambulation/Gait Ambulation/Gait assistance: Contact guard assist Gait Distance (Feet): 200 Feet Assistive device: Rolling walker (2 wheels) Gait Pattern/deviations: Step-through pattern, Decreased stride length Gait velocity: reduced Gait velocity interpretation: <1.8 ft/sec, indicate of risk for recurrent falls   General Gait Details: Pt ambulated with a reciprocal gait pattern, even weight shift, and good foot clearence. She manuevered within room/hallway well navigating obstacles. As pt fatigued she became slightly unsteady. Cued increased WBing through BUE support on RW to improve stability.  Stairs            Wheelchair Mobility     Tilt Bed    Modified Rankin (Stroke Patients Only)       Balance Overall balance assessment: Mild deficits observed, not formally tested                                           Pertinent Vitals/Pain Pain Assessment Pain Assessment: No/denies pain  Home Living Family/patient expects to be discharged to:: Private residence Living Arrangements: Alone Available Help at Discharge: Family;Available PRN/intermittently Type of Home: House [facility]) Home Access: Stairs to enter Entrance Stairs-Rails: Left Entrance Stairs-Number of  Steps: 1   Home Layout: One level Home Equipment: Agricultural consultant (2 wheels);Rollator (4 wheels);Cane - single point Additional Comments: Pt reports a PT comes out twice a week to work with her currently.    Prior Function Prior Level of Function : Independent/Modified Independent             Mobility Comments: Ambulates using various ADs including RW, three wheeled walker, and rollator. Reports 2 falls in the last 88mo. ADLs Comments: Indep with ADLs/IADLs.     Extremity/Trunk Assessment   Upper Extremity Assessment Upper Extremity Assessment: Defer to OT evaluation    Lower Extremity Assessment Lower Extremity Assessment: Overall WFL for tasks assessed    Cervical / Trunk Assessment Cervical / Trunk Assessment: Normal  Communication   Communication Communication: No apparent difficulties    Cognition Arousal: Alert Behavior During Therapy: WFL for tasks assessed/performed   PT - Cognitive impairments: No apparent impairments                       PT - Cognition Comments: Pt A,Ox4 Following commands: Intact       Cueing Cueing Techniques: Verbal cues     General Comments General comments (skin integrity, edema, etc.): Pt tachycardic with gait HR 120s with spike into 130-140s which quickly recovered. Pt denied dizziness, lightheadedness, and SOB.    Exercises     Assessment/Plan    PT Assessment Patient needs continued PT services  PT Problem List Decreased activity tolerance;Decreased balance;Decreased strength;Decreased mobility       PT Treatment Interventions DME instruction;Gait training;Stair training;Functional mobility training;Therapeutic activities;Therapeutic exercise;Balance training;Patient/family education    PT Goals (Current goals can be found in the Care Plan section)  Acute Rehab PT Goals Patient Stated Goal: Return Home PT Goal Formulation: With patient/family Time For Goal Achievement: 07/03/24 Potential to Achieve Goals:  Good    Frequency Min 1X/week     Co-evaluation               AM-PAC PT 6 Clicks Mobility  Outcome Measure Help needed turning from your back to your side while in a flat bed without using bedrails?: A Little Help needed moving from lying on your back to sitting on the side of a flat bed without using bedrails?: A Little Help needed moving to and from a bed to a chair (including a wheelchair)?: A Little Help needed standing up from a chair using your arms (e.g., wheelchair or bedside chair)?: A Little Help needed to walk in hospital room?: A Little Help needed climbing 3-5 steps with a railing? : A Little 6 Click Score: 18    End of Session Equipment Utilized During Treatment: Gait belt Activity Tolerance: Patient tolerated treatment well Patient left: in bed;with call bell/phone within reach;with family/visitor present Nurse Communication: Mobility status PT Visit Diagnosis: Unsteadiness on feet (R26.81);Other abnormalities of gait and mobility (R26.89)    Time: 1421-1450 PT Time Calculation (min) (ACUTE ONLY): 29 min   Charges:   PT Evaluation $PT Eval Low Complexity: 1 Low PT Treatments $Gait Training: 8-22 mins PT General Charges $$ ACUTE PT VISIT: 1 Visit         Randall SAUNDERS, PT, DPT Acute Rehabilitation Services Office: [phone] Secure Chat Preferred  Amy Fischer 06/19/2024, 3:29  PM

## 2024-06-19 NOTE — Progress Notes (Signed)
Nurse Notified.

## 2024-06-19 NOTE — Progress Notes (Signed)
  Progress Note  Patient Name: Amy Fischer Date of Encounter: 06/19/2024 [facility] Cardiologist: Redell Shallow, MD   Interval Summary   Had some confusion overnight.  Stated that she did have a bout of chest discomfort that has resolved.  Vital Signs Vitals:   06/18/24 2338 06/19/24 0514 06/19/24 0600 06/19/24 0800  BP: 105/73 (!) 96/58 111/67 (!) 96/58  Pulse: 77 94  77  Resp: 20 18 19 20   Temp: 98.1 F (36.7 C) 97.8 F (36.6 C) 97.8 F (36.6 C) (!) 97.3 F (36.3 C)  TempSrc: Oral Oral  Oral  SpO2: 98% 100% 100% 98%  Weight:  45.3 kg    Height:        Intake/Output Summary (Last 24 hours) at 06/19/2024 0951 Last data filed at 06/19/2024 0245 Gross per 24 hour  Intake 243 ml  Output 1850 ml  Net -1607 ml      06/19/2024    5:14 AM 06/17/2024   10:33 PM 03/09/2024   11:16 AM  Last 3 Weights  Weight (lbs) 99 lb 14.4 oz 99 lb 98 lb 12.8 oz  Weight (kg) 45.314 kg 44.906 kg 44.815 kg      Telemetry/ECG  Atrial fibrillation/flutter 80-100 on telemetry- Personally Reviewed EKG this morning with atrial fibrillation rate 93 bpm improved  Physical Exam  GEN: No acute distress.  Thin, elderly, mildly confused this morning Neck: No JVD Cardiac: Irregularly irregular  no murmurs, rubs, or gallops.  Respiratory: Clear to auscultation bilaterally. GI: Soft, nontender, non-distended  MS: No edema  Assessment & Plan  89 year old with mitral valve repair postoperative atrial fibrillation previously briefly on amiodarone  on chronic anticoagulation with Eliquis  2.5 mg twice a day here with worsening lower extremity edema shortness of breath compatible with acute diastolic heart failure in the setting of new atrial flutter with rapid ventricular response  Atrial flutter/atrial fibrillation Acute diastolic heart failure - Continue with Eliquis  2.5 mg twice a day, she has not missed any dosing. -I gave her metoprolol  tartrate 25 mg 4 times a day yesterday to help  improve heart rate.  Agree with consolidation to metoprolol  succinate 50 mg daily-Received IV Lasix  20 mg IV x 2, this was discontinued this morning.  Edema improved, now on furosemide  40 mg daily tablet creatinine 1.03 up from 0.98 potassium 3.8 BNP up to 588 troponin flat 33, 28 hemoglobin 13.1 -Has Entresto  24/26 mg twice a day.  Blood pressure may limit usage of this.  Mild hyperthyroidism - Free T4 is 1.2 minimally elevated.  Post mitral valve repair - Echocardiogram shows EF 65 to 70% with right ventricular pressures about 50 mmHg elevated, mild to moderate mitral valve regurgitation and mild stenosis with 28 mm Carpentier a prosthetic annuloplasty ring in the annular position.  Also has severe tricuspid regurgitation.  Likely contributing as well to the lower extremity edema which has improved.  DNR  I would not be opposed to trying to see if we could discharge her later this afternoon as she would be more comfortable at home environment and she was demonstrating sundowning.   For questions or updates, please contact [facility] Please consult [email or web address] for contact info under       Signed, Oneil Parchment, MD

## 2024-06-19 NOTE — Plan of Care (Signed)
  Problem: Health Behavior/Discharge Planning: Goal: Ability to safely manage health-related needs after discharge will improve 06/19/2024 0344 by Alroy Powell ORN, RN Outcome: Progressing   Problem: Education: Goal: Ability to demonstrate management of disease process will improve 06/19/2024 0344 by Alroy Powell ORN, RN Outcome: Progressing  Problem: Education: Goal: Ability to verbalize understanding of medication therapies will improve 06/19/2024 0344 by Alroy Powell ORN, RN Outcome: Progressing   Problem: Education: Goal: Individualized Educational Video(s) 06/19/2024 0344 by Alroy Powell ORN, RN Outcome: Progressing   Problem: Activity: Goal: Capacity to carry out activities will improve 06/19/2024 0344 by Alroy Powell ORN, RN Outcome: Progressing   Problem: Cardiac: Goal: Ability to achieve and maintain adequate cardiopulmonary perfusion will improve 06/19/2024 0344 by Alroy Powell ORN, RN Outcome: Progressing

## 2024-06-19 NOTE — Evaluation (Signed)
 Occupational Therapy Evaluation Patient Details Name: Amy Fischer MRN: [id number] DOB: February 05, 1929 Today's Date: 06/19/2024   History of Present Illness   Amy Fischer is a 89 y.o. female admitted 06/17/24 for acute exacerbation of CHF versus COPD. Found to be in atrial fibrillation with rapid ventricular response. PMHx: diastolic dysfunction CHF, GERD, HLD, OA, osteoporosis, carotid artery stenosis, hypothyroidism, essential hypertension, and esophageal spasm     Clinical Impressions Pt admitted for above, PTA pt reports living alone and being independent with her ADLs/iADLs, being a typically active person within her home. She ambulates with 3wRW normally at home, notes a hx of falls about every 2 months she reports. Pt currently completing ADLs and mobility with CGA, she did have one posterior postural sway needing min A to correct + cues to shift weight anteriorly. Educated her on elevation to reduce BLE edema. Pt likely to progress closer to baseline with more time in acute stay. OT to continue following pt acutely to help progress closer to baseline. She would benefit from HHOT to address fall safety and educate on compensatory strategies in home to reduce further risk of falls.      If plan is discharge home, recommend the following:   Other (comment) (PRN)     Functional Status Assessment   Patient has had a recent decline in their functional status and demonstrates the ability to make significant improvements in function in a reasonable and predictable amount of time.     Equipment Recommendations   None recommended by OT     Recommendations for Other Services         Precautions/Restrictions   Precautions Precautions: Fall Recall of Precautions/Restrictions: Intact Precaution/Restrictions Comments: watch HR Restrictions Weight Bearing Restrictions Per Provider Order: No     Mobility Bed Mobility Overal bed mobility: Modified Independent              General bed mobility comments: no assist need with any portion of bed mobility    Transfers Overall transfer level: Needs assistance Equipment used: Rolling walker (2 wheels) Transfers: Sit to/from Stand Sit to Stand: Contact guard assist                  Balance Overall balance assessment: Mild deficits observed, not formally tested                                         ADL either performed or assessed with clinical judgement   ADL Overall ADL's : Needs assistance/impaired Eating/Feeding: Independent;Sitting   Grooming: Standing;Contact guard assist   Upper Body Bathing: Sitting;Set up   Lower Body Bathing: Set up;Sitting/lateral leans Lower Body Bathing Details (indicate cue type and reason): a little more effort Upper Body Dressing : Sitting;Set up   Lower Body Dressing: Contact guard assist;Sit to/from stand Lower Body Dressing Details (indicate cue type and reason): don socks with setup, CGA to don briefs during STS. Toilet Transfer: Contact guard assist;Rolling walker (2 wheels);Ambulation Toilet Transfer Details (indicate cue type and reason): assist to stedy RW, pulls up from her Rollator at home with locked brakes. Toileting- Clothing Manipulation and Hygiene: Contact guard assist;Sit to/from stand       Functional mobility during ADLs: Contact guard assist;Rolling walker (2 wheels) General ADL Comments: Pt reports hx of falls from losing balance, notes one instance when she reached down and lost balance. Discussed use of her reacher  at home to grab objects from floor. Also discussed sitting/standing for a min to allow BP to adjust if feeling dizzy with postural changes. mentioned to family use of her BSC if needing more assist with STS in the future.     Vision   Vision Assessment?: No apparent visual deficits     Perception         Praxis         Pertinent Vitals/Pain Pain Assessment Pain Assessment: Faces Faces Pain  Scale: Hurts little more Pain Location: chest Pain Descriptors / Indicators: Aching, Sore Pain Intervention(s): Monitored during session, Limited activity within patient's tolerance, Other (comment) (notified RN)     Extremity/Trunk Assessment Upper Extremity Assessment Upper Extremity Assessment: Overall WFL for tasks assessed   Lower Extremity Assessment Lower Extremity Assessment: Overall WFL for tasks assessed   Cervical / Trunk Assessment Cervical / Trunk Assessment: Normal   Communication Communication Communication: No apparent difficulties   Cognition Arousal: Alert Behavior During Therapy: WFL for tasks assessed/performed Cognition: No apparent impairments                               Following commands: Intact       Cueing  General Comments   Cueing Techniques: Verbal cues  Pt HR fluctuarting 96-117bpm throughout activity. Family present during session.   Exercises     Shoulder Instructions      Home Living Family/patient expects to be discharged to:: Private residence Living Arrangements: Alone Available Help at Discharge: Family;Available PRN/intermittently Type of Home: House [facility]) Home Access: Stairs to enter [employer] of Steps: 1 Entrance Stairs-Rails: Left Home Layout: One level     Bathroom Shower/Tub: Producer, television/film/video: Standard     Home Equipment: Agricultural consultant (2 wheels);Rollator (4 wheels);Cane - single point;Adaptive equipment Adaptive Equipment: Reacher Additional Comments: Pt reports a PT comes out twice a week to work with her currently.Plays cards, mostly hand and foot. Pt reports falls about every 2 months.      Prior Functioning/Environment Prior Level of Function : Independent/Modified Independent;History of Falls (last six months)             Mobility Comments: Ambulates using various ADs including RW, three wheeled walker, and rollator. Reports 2 falls in the last  10mo. ADLs Comments: Indep with ADLs/IADLs.    OT Problem List: Impaired balance (sitting and/or standing);Cardiopulmonary status limiting activity   OT Treatment/Interventions: Patient/family education;Balance training;Therapeutic activities;Therapeutic exercise;Self-care/ADL training      OT Goals(Current goals can be found in the care plan section)   Acute Rehab OT Goals Patient Stated Goal: To return home OT Goal Formulation: With patient Time For Goal Achievement: 07/03/24 Potential to Achieve Goals: Good   OT Frequency:  Min 2X/week    Co-evaluation              AM-PAC OT 6 Clicks Daily Activity     Outcome Measure Help from another person eating meals?: None Help from another person taking care of personal grooming?: A Little Help from another person toileting, which includes using toliet, bedpan, or urinal?: A Little Help from another person bathing (including washing, rinsing, drying)?: A Little Help from another person to put on and taking off regular upper body clothing?: A Little Help from another person to put on and taking off regular lower body clothing?: A Little 6 Click Score: 19   End of Session Equipment Utilized During Treatment:  Rolling walker (2 wheels) Nurse Communication: Mobility status  Activity Tolerance: Patient tolerated treatment well Patient left: in bed;with call bell/phone within reach;with bed alarm set  OT Visit Diagnosis: History of falling (Z91.81);Unsteadiness on feet (R26.81);Other (comment) (SOB)                Time: 8458-8381 OT Time Calculation (min): 37 min Charges:  OT General Charges $OT Visit: 1 Visit OT Evaluation $OT Eval Low Complexity: 1 Low OT Treatments $Self Care/Home Management : 8-22 mins  06/19/2024  AB, OTR/L  Acute Rehabilitation Services  Office: [phone]   Curtistine JONETTA Das 06/19/2024, 6:01 PM

## 2024-06-19 NOTE — Progress Notes (Signed)
 PROGRESS NOTE    Patient: Amy Fischer                            PCP: Charlett [name], MD                    DOB: [birth year]            DOA: 06/17/2024 FMW:[id number]             DOS: 06/19/2024, 12:06 PM   LOS: 1 day   Date of Service: The patient was seen and examined on 06/19/2024  Subjective:   The patient was seen and examined this morning, stable no acute distress, earlier this morning was complaining of some brief chest pain, relieved by morphine  Mildly hypertensive otherwise stable awake alert oriented in no distress at this time.  Brief Narrative:    Amy Fischer is a 89 y.o. female with medical history significant of diastolic dysfunction CHF, GERD, hyperlipidemia, osteoarthritis, osteoporosis, carotid artery stenosis, hypothyroidism, essential hypertension and esophageal spasm who was brought in from home secondary to sudden onset shortness of breath for about 2 hours.  She was having difficulty taking deep breath.  Especially with mild exertion.  Patient has reported history of COPD although not documented anywhere.  She has history of A-fib and when EMS arrived her heart rate was 140s to 160s in atrial fibrillation.  She was given 10 mg of Cardizem IV and the rate has dropped to between 80-100.  Patient also received pembrolizumab 150 cc from EMS.  In the ER she been evaluated and treated with a dose of IV Lasix .  Patient is no longer requiring oxygen but still weak.  Patient appears to be acute exacerbation of CHF versus COPD.     Assessment & Plan:   Principal Problem:   Acute hypoxic respiratory failure (HCC) Active Problems:   Acute on chronic diastolic congestive heart failure, NYHA class 3 (HCC)   Paroxysmal atrial fibrillation (HCC)   Hypothyroidism   S/P MVR (mitral valve repair)   GERD   PAF (paroxysmal atrial fibrillation) (HCC)   Chronic diastolic CHF (congestive heart failure), NYHA class 2 (HCC)   CAD (coronary artery disease)   COPD with acute  exacerbation (HCC)   Acute hypoxic respiratory failure:  Likely due to congestive heart failure- HFpEF Improved, now satting 98% on room air Likely exacerbated by CHF exacerbation, and COPD - Managing underlying causes - Continue diuretics - Monitoring I's and O's, daily weights, labs, BMP - Symptomatic improvement with diuretics - Echo: Reviewed preserved ejection fraction, 65 to 70%.  Left ventricular diastolic parameters are  indeterminate.  Normal right RV systolic function, size is mildly enlarged. There is moderately elevated  pulmonary artery systolic pressure.   Intake/Output Summary (Last 24 hours) at 06/18/2024 1124 Last data filed at 06/18/2024 0330 Gross per 24 hour  Intake --  Output 850 ml  Net -850 ml   Filed Weights   06/17/24 2233  Weight: 44.9 kg   -Audiology changed IV Lasix  to furosemide  40 mg daily, creatinine stable, - Added Entresto  24/25 mg p.o. twice daily Blood pressure soft might be limiting factor  Coronary artery disease: Stable.  Continue to monitor   Paroxysmal atrial fibrillation with RVR:  Discontinue Cardizem due to soft BP -Added metoprolol  - Continue Eliquis     -Will continue to monitor   HFpEF Management as above Heart failure treatment  Hyperlipidemia -continue statin  hypothyroidism: Continue with levothyroxine    Status post mitral valve repair: Continue to monitor   GERD: Continue with PPIs   ------------------------------------------------------------------------------------------------------------- Nutritional status:  The patient's BMI is: Body mass index is 18.88 kg/m. I agree with the assessment and plan as outlined   DVT prophylaxis:  apixaban  (ELIQUIS ) tablet 2.5 mg Start: 06/18/24 0215 apixaban  (ELIQUIS ) tablet 2.5 mg   Code Status:   Code Status: Limited: Do not attempt resuscitation (DNR) -DNR-LIMITED -Do Not Intubate/DNI   Family Communication: Daughter present at bedside updated-Advance care planning  has been discussed.   Admission status:   Status is: Inpatient Needing IV medication including IV Lasix  for acute heart failure, A-fib   Disposition: From  - home             Planning for discharge in 1 days: to home in AM    Procedures:   No admission procedures for hospital encounter.   Antimicrobials:  Anti-infectives (From admission, onward)    None        Medication:   apixaban   2.5 mg Oral BID   artificial tears  1 drop Both Eyes BID   cholecalciferol   1,000 Units Oral Daily   cyanocobalamin   1,000 mcg Oral Daily   ezetimibe   10 mg Oral Daily   feeding supplement  237 mL Oral BID BM   folic acid   1 mg Oral Daily   [START ON 06/20/2024] furosemide   20 mg Oral Daily   ipratropium  1 spray Each Nare TID   levothyroxine   100 mcg Oral Q0600   melatonin  5 mg Oral QHS   metoprolol  succinate  50 mg Oral Daily   rOPINIRole   0.25 mg Oral QHS   rosuvastatin   2.5 mg Oral Daily    acetaminophen , methocarbamol , ondansetron  (ZOFRAN ) IV   Objective:   Vitals:   06/18/24 2338 06/19/24 0514 06/19/24 0600 06/19/24 0800  BP: 105/73 (!) 96/58 111/67 (!) 96/58  Pulse: 77 94  77  Resp: 20 18 19 20   Temp: 98.1 F (36.7 C) 97.8 F (36.6 C) 97.8 F (36.6 C) (!) 97.3 F (36.3 C)  TempSrc: Oral Oral  Oral  SpO2: 98% 100% 100% 98%  Weight:  45.3 kg    Height:        Intake/Output Summary (Last 24 hours) at 06/19/2024 1206 Last data filed at 06/19/2024 0900 Gross per 24 hour  Intake 483 ml  Output 1150 ml  Net -667 ml   Filed Weights   06/17/24 2233 06/19/24 0514  Weight: 44.9 kg 45.3 kg     Physical examination:   General:  AAO x 3,  cooperative, no distress;   HEENT:  Normocephalic, PERRL, otherwise with in Normal limits   Neuro:  CNII-XII intact. , normal motor and sensation, reflexes intact   Lungs:   Clear to auscultation BL, Respirations unlabored,  No wheezes / crackles  Cardio:    S1/S2, RRR, No murmure, No Rubs or Gallops   Abdomen:  Soft,  non-tender, bowel sounds active all four quadrants, no guarding or peritoneal signs.  Muscular  skeletal:  Limited exam -global generalized weaknesses - in bed, able to move all 4 extremities,   2+ pulses,  symmetric, +1 pitting edema  Skin:  Dry, warm to touch, negative for any Rashes,  Wounds: Please see nursing documentation    -------------------------------------------------------------------------------------------------    LABs:     Latest Ref Rng & Units 06/17/2024   10:39 PM 03/09/2024   12:07 PM 11/13/2023  8:27 AM  CBC  WBC 4.0 - 10.5 K/uL 7.4  5.9  7.4   Hemoglobin 12.0 - 15.0 g/dL 86.8  85.8  84.6   Hematocrit 36.0 - 46.0 % 41.2  41.6  45.4   Platelets 150 - 400 K/uL 160  174.0  218       Latest Ref Rng & Units 06/19/2024    7:58 AM 06/17/2024   10:39 PM 03/09/2024   12:07 PM  CMP  Glucose 70 - 99 mg/dL 895  837  96   BUN 8 - 23 mg/dL 21  23  22    Creatinine 0.44 - 1.00 mg/dL 8.96  9.01  9.20   Sodium 135 - 145 mmol/L 141  142  141   Potassium 3.5 - 5.1 mmol/L 3.8  4.1  4.0   Chloride 98 - 111 mmol/L 102  103  105   CO2 22 - 32 mmol/L 26  26  29    Calcium  8.9 - 10.3 mg/dL 9.1  89.9  89.6        Micro Results No results found for this or any previous visit (from the past 240 hours).  Radiology Reports ECHOCARDIOGRAM COMPLETE Result Date: 06/18/2024    ECHOCARDIOGRAM REPORT   Patient Name:   Amy Fischer Date of Exam: 06/18/2024 Medical Rec #:  [id number]       Height:       61.0 in Accession #:    [id number]      Weight:       99.0 lb Date of Birth:  17-Sep-1929        BSA:          1.401 m Patient Age:    89 years        BP:           114/100 mmHg Patient Gender: F               HR:           92 bpm. Exam Location:  Inpatient Procedure: 2D Echo, Cardiac Doppler and Color Doppler (Both Spectral and Color            Flow Doppler were utilized during procedure). Indications:    CHF - Acute Diastolic I50.31  History:        Patient has prior history of  Echocardiogram examinations, most                 recent 12/29/2022. CHF, CAD, COPD, Arrythmias:Atrial Fibrillation,                 Signs/Symptoms:Shortness of Breath; Risk Factors:Hypertension                 and Dyslipidemia.                  Mitral Valve: 28 mm Carpentier prosthetic annuloplasty ring                 valve is present in the mitral position. Procedure Date:                 10/10/2015.  Sonographer:    Thea Norlander RCS Referring Phys: EMERY LITTIE FUSS IMPRESSIONS  1. Left ventricular ejection fraction, by estimation, is 65 to 70%. The left ventricle has normal function. The left ventricle has no regional wall motion abnormalities. Left ventricular diastolic parameters are indeterminate.  2. Right ventricular systolic function is low normal. The right ventricular size is mildly enlarged. There is moderately  elevated pulmonary artery systolic pressure. The estimated right ventricular systolic pressure is 48.6 mmHg.  3. Left atrial size was mildly dilated.  4. Right atrial size was mildly dilated.  5. The mitral valve has been repaired/replaced. Mild to moderate mitral valve regurgitation. Mild mitral stenosis. The mean mitral valve gradient is 5.5 mmHg. There is a 28 mm Carpentier prosthetic annuloplasty ring present in the mitral position. Procedure Date: 10/10/2015.  6. Tricuspid valve regurgitation is severe.  7. The aortic valve is tricuspid. There is moderate calcification of the aortic valve. Aortic valve regurgitation is trivial. No aortic stenosis is present.  8. The inferior vena cava is normal in size with greater than 50% respiratory variability, suggesting right atrial pressure of 3 mmHg. FINDINGS  Left Ventricle: Left ventricular ejection fraction, by estimation, is 65 to 70%. The left ventricle has normal function. The left ventricle has no regional wall motion abnormalities. The left ventricular internal cavity size was normal in size. There is  no left ventricular hypertrophy. Left  ventricular diastolic parameters are indeterminate. Right Ventricle: The right ventricular size is mildly enlarged. No increase in right ventricular wall thickness. Right ventricular systolic function is low normal. There is moderately elevated pulmonary artery systolic pressure. The tricuspid regurgitant  velocity is 2.90 m/s, and with an assumed right atrial pressure of 15 mmHg, the estimated right ventricular systolic pressure is 48.6 mmHg. Left Atrium: Left atrial size was mildly dilated. Right Atrium: Right atrial size was mildly dilated. Pericardium: There is no evidence of pericardial effusion. Mitral Valve: The mitral valve has been repaired/replaced. Mild to moderate mitral valve regurgitation. There is a 28 mm Carpentier prosthetic annuloplasty ring present in the mitral position. Procedure Date: 10/10/2015. Mild mitral valve stenosis. MV peak gradient, 13.2 mmHg. The mean mitral valve gradient is 5.5 mmHg. Tricuspid Valve: The tricuspid valve is normal in structure. Tricuspid valve regurgitation is severe. No evidence of tricuspid stenosis. Aortic Valve: The aortic valve is tricuspid. There is moderate calcification of the aortic valve. Aortic valve regurgitation is trivial. No aortic stenosis is present. Aortic valve peak gradient measures 2.9 mmHg. Pulmonic Valve: The pulmonic valve was normal in structure. Pulmonic valve regurgitation is not visualized. No evidence of pulmonic stenosis. Aorta: The aortic root is normal in size and structure. Venous: The inferior vena cava is normal in size with greater than 50% respiratory variability, suggesting right atrial pressure of 3 mmHg. IAS/Shunts: No atrial level shunt detected by color flow Doppler.  LEFT VENTRICLE PLAX 2D LVIDd:         3.10 cm   Diastology LVIDs:         2.20 cm   LV e' medial:    7.40 cm/s LV PW:         0.80 cm   LV E/e' medial:  23.0 LV IVS:        0.60 cm   LV e' lateral:   9.03 cm/s LVOT diam:     2.00 cm   LV E/e' lateral: 18.8 LV  SV:         25 LV SV Index:   18 LVOT Area:     3.14 cm  RIGHT VENTRICLE            IVC RV S prime:     9.36 cm/s  IVC diam: 2.25 cm TAPSE (M-mode): 1.1 cm LEFT ATRIUM             Index        RIGHT ATRIUM  Index LA diam:        4.10 cm 2.93 cm/m   RA Area:     14.60 cm LA Vol (A2C):   54.6 ml 38.98 ml/m  RA Volume:   32.30 ml  23.06 ml/m LA Vol (A4C):   51.2 ml 36.55 ml/m LA Biplane Vol: 53.7 ml 38.33 ml/m  AORTIC VALVE AV Area (Vmax): 2.17 cm AV Vmax:        85.60 cm/s AV Peak Grad:   2.9 mmHg LVOT Vmax:      59.20 cm/s LVOT Vmean:     36.800 cm/s LVOT VTI:       0.080 m  AORTA Ao Root diam: 2.90 cm Ao Asc diam:  2.80 cm MITRAL VALVE                TRICUSPID VALVE MV Area (PHT): 3.27 cm     TR Peak grad:   33.6 mmHg MV Area VTI:   0.67 cm     TR Vmax:        290.00 cm/s MV Peak grad:  13.2 mmHg MV Mean grad:  5.5 mmHg     SHUNTS MV Vmax:       1.82 m/s     Systemic VTI:  0.08 m MV Vmean:      104.4 cm/s   Systemic Diam: 2.00 cm MV Decel Time: 232 msec MV E velocity: 170.00 cm/s MV A velocity: 98.50 cm/s MV E/A ratio:  1.73 Oneil Parchment MD Electronically signed by Oneil Parchment MD Signature Date/Time: 06/18/2024/4:22:48 PM    Final     SIGNED: Adriana DELENA Grams, MD, FHM. FAAFP. Jolynn Pack - [facility] Time spent - 55 min.  In seeing, evaluating and examining the patient. Reviewing medical records, labs, drawn plan of care. [facility],  Pager (please use [email or web address] to page/ text) Please use Epic Secure Chat for non-urgent communication (7AM-7PM)  If 7PM-7AM, please contact night-coverage [email or web address], 06/19/2024, 12:06 PM

## 2024-06-19 NOTE — Plan of Care (Signed)
  Problem: Education: Goal: Knowledge of disease or condition will improve 06/19/2024 0334 by Alroy Powell ORN, RN Outcome: Progressing  Problem: Education: Goal: Understanding of medication regimen will improve 06/19/2024 0334 by Alroy Powell ORN, RN Outcome: Progressing  Problem: Health Behavior/Discharge Planning: Goal: Ability to safely manage health-related needs after discharge will improve 06/19/2024 0334 by Alroy Powell ORN, RN Outcome: Progressing  Problem: Activity: Goal: Ability to tolerate increased activity will improve 06/19/2024 0334 by Alroy Powell ORN, RN Outcome: Progressing   Problem: Cardiac: Goal: Ability to achieve and maintain adequate cardiopulmonary perfusion will improve 06/19/2024 0334 by Alroy Powell ORN, RN Outcome: Progressing  Problem: Education: Goal: Ability to demonstrate management of disease process will improve Outcome: Progressing   Problem: Education: Goal: Ability to verbalize understanding of medication therapies will improve Outcome: Progressing   Problem: Education: Goal: Individualized Educational Video(s) Outcome: Progressing   Problem: Activity: Goal: Capacity to carry out activities will improve Outcome: Progressing   Problem: Cardiac: Goal: Ability to achieve and maintain adequate cardiopulmonary perfusion will improve Outcome: Progressing

## 2024-06-20 ENCOUNTER — Other Ambulatory Visit (HOSPITAL_COMMUNITY): Payer: Self-pay

## 2024-06-20 DIAGNOSIS — I5043 Acute on chronic combined systolic (congestive) and diastolic (congestive) heart failure: Secondary | ICD-10-CM

## 2024-06-20 DIAGNOSIS — J9601 Acute respiratory failure with hypoxia: Secondary | ICD-10-CM | POA: Diagnosis not present

## 2024-06-20 LAB — BASIC METABOLIC PANEL WITH GFR
Anion gap: 11 (ref 5–15)
BUN: 26 mg/dL — ABNORMAL HIGH (ref 8–23)
CO2: 26 mmol/L (ref 22–32)
Calcium: 9.4 mg/dL (ref 8.9–10.3)
Chloride: 102 mmol/L (ref 98–111)
Creatinine, Ser: 1.04 mg/dL — ABNORMAL HIGH (ref 0.44–1.00)
GFR, Estimated: 50 mL/min — ABNORMAL LOW
Glucose, Bld: 98 mg/dL (ref 70–99)
Potassium: 4.1 mmol/L (ref 3.5–5.1)
Sodium: 139 mmol/L (ref 135–145)

## 2024-06-20 LAB — BRAIN NATRIURETIC PEPTIDE: B Natriuretic Peptide: 334.5 pg/mL — ABNORMAL HIGH (ref 0.0–100.0)

## 2024-06-20 LAB — T3, FREE: T3, Free: 2.2 pg/mL (ref 2.0–4.4)

## 2024-06-20 MED ORDER — MIDODRINE HCL 5 MG PO TABS
5.0000 mg | ORAL_TABLET | Freq: Three times a day (TID) | ORAL | Status: DC
  Administered 2024-06-20 – 2024-06-21 (×4): 5 mg via ORAL
  Filled 2024-06-20 (×4): qty 1

## 2024-06-20 MED ORDER — METOPROLOL SUCCINATE ER 25 MG PO TB24: 12.5000 mg | ORAL_TABLET | Freq: Every day | ORAL | 1 refills | Status: DC

## 2024-06-20 MED ORDER — METOPROLOL SUCCINATE ER 25 MG PO TB24: 12.5000 mg | ORAL_TABLET | Freq: Every day | ORAL | Status: DC

## 2024-06-20 MED ORDER — MIDODRINE HCL 5 MG PO TABS: 5.0000 mg | ORAL_TABLET | Freq: Three times a day (TID) | ORAL | 0 refills | Status: DC

## 2024-06-20 MED ORDER — METOPROLOL SUCCINATE ER 25 MG PO TB24
12.5000 mg | ORAL_TABLET | Freq: Every day | ORAL | 1 refills | Status: DC
  Filled 2024-06-20 – 2024-06-21 (×4): qty 15, 30d supply, fill #0

## 2024-06-20 MED ORDER — AMIODARONE HCL 200 MG PO TABS
200.0000 mg | ORAL_TABLET | Freq: Two times a day (BID) | ORAL | Status: DC
  Administered 2024-06-20 – 2024-06-21 (×3): 200 mg via ORAL
  Filled 2024-06-20 (×3): qty 1

## 2024-06-20 MED ORDER — AMIODARONE HCL 200 MG PO TABS
ORAL_TABLET | ORAL | 0 refills | Status: DC
  Filled 2024-06-20 – 2024-06-21 (×5): qty 44, 37d supply, fill #0

## 2024-06-20 MED ORDER — MIDODRINE HCL 5 MG PO TABS
5.0000 mg | ORAL_TABLET | Freq: Three times a day (TID) | ORAL | 0 refills | Status: DC
  Filled 2024-06-20 – 2024-06-21 (×5): qty 90, 30d supply, fill #0

## 2024-06-20 MED ORDER — ROPINIROLE HCL 0.25 MG PO TABS
0.2500 mg | ORAL_TABLET | Freq: Every day | ORAL | 0 refills | Status: DC
  Filled 2024-06-20 – 2024-06-21 (×4): qty 30, 30d supply, fill #0

## 2024-06-20 MED ORDER — AMIODARONE HCL 200 MG PO TABS: ORAL_TABLET | ORAL | 0 refills | Status: DC

## 2024-06-20 MED ORDER — ROPINIROLE HCL 0.25 MG PO TABS: 0.2500 mg | ORAL_TABLET | Freq: Every day | ORAL | 0 refills | Status: DC

## 2024-06-20 MED ORDER — AMIODARONE HCL 200 MG PO TABS: 200.0000 mg | ORAL_TABLET | Freq: Every day | ORAL | Status: DC

## 2024-06-20 NOTE — TOC Initial Note (Addendum)
 Transition of Care [facility]) - Initial/Assessment Note    Patient Details  Name: Amy Fischer MRN: [id number] Date of Birth: Feb 07, 1929  [facility]) CM/SW Contact:    Sudie Erminio Deems, RN Phone Number: 06/20/2024, 2:54 PM  Clinical Narrative:  Patient presented for shortness of breath. PTA patient states she is from home alone and has support of son and daughter. Patient has DME rolling walker. Patient reports that she has [facility] PT with a person named Bella @ [phone]. Patient is unsure if Bella works with an agency. Patient provided Case Manager verbal permission to call her son to verify. Case Manager did speak with son and he is not sure if she works privately vs an Scientist, forensic. Son is calling his sister to verify. Case Manager will await call back from son.   06-20-24 1605 Case Manager spoke with Bella with [facility] and she is the owner. Case Manager sent a secure message to Dr. Willette and he is aware that the patient will need an actual paper Rx for PT evaluation and treatment 2 x week for maintenance therapy for 12 weeks max. Son will provide patient transportation home.                 Expected Discharge Plan: Home w Home Health Services Barriers to Discharge: No Barriers Identified   Patient Goals and CMS Choice Patient states their goals for this hospitalization and ongoing recovery are:: plan to return home with PT         Expected Discharge Plan and Services   Discharge Planning Services: CM Consult Post Acute Care Choice: Home Health Living arrangements for the past 2 months: Single Family Home Expected Discharge Date: 06/20/24                         [facility] Arranged: PT          Prior Living Arrangements/Services Living arrangements for the past 2 months: Single Family Home Lives with:: Self Patient language and need for interpreter reviewed:: Yes Do you feel safe going back to the place where you live?: Yes      Need for  Family Participation in Patient Care: No (Comment) Care giver support system in place?: No (comment) Current home services: Home PT Criminal Activity/Legal Involvement Pertinent to Current Situation/Hospitalization: No - Comment as needed  Activities of Daily Living   ADL Screening (condition at time of admission) Independently performs ADLs?: Yes (appropriate for developmental age) Is the patient deaf or have difficulty hearing?: No Does the patient have difficulty seeing, even when wearing glasses/contacts?: No Does the patient have difficulty concentrating, remembering, or making decisions?: No  Permission Sought/Granted Permission sought to share information with : Family Supports, Case Production designer, theatre/television/film, Oceanographer granted to share information with : Yes, Verbal Permission Granted     Permission granted to share info w [employer] [phone]        Emotional Assessment Appearance:: Appears stated age Attitude/Demeanor/Rapport: Engaged Affect (typically observed): Appropriate Orientation: : Oriented to Self, Oriented to Place, Oriented to  Time Alcohol  / Substance Use: Not Applicable Psych Involvement: No (comment)  Admission diagnosis:  Atrial fibrillation with RVR (HCC) [I48.91] Atrial fibrillation, unspecified type (HCC) [I48.91] Acute on chronic congestive heart failure, unspecified heart failure type (HCC) [I50.9] Acute hypoxic respiratory failure (HCC) [J96.01] Patient Active Problem List   Diagnosis Date Noted   COPD with acute exacerbation (HCC) 06/18/2024   Acute hypoxic respiratory  failure (HCC) 06/18/2024   Acute on chronic diastolic (congestive) heart failure (HCC) 12/29/2022   Elevated troponin 12/29/2022   COVID-19 virus infection 12/29/2022   T12 compression fracture (HCC) 12/29/2022   Chronic anticoagulation 12/29/2022   CAD (coronary artery disease) 02/22/2016   Unsteadiness 11/20/2015   PAF (paroxysmal atrial fibrillation)  (HCC) 11/06/2015   Chronic diastolic CHF (congestive heart failure), NYHA class 2 (HCC) 11/06/2015   S/P MVR (mitral valve repair) 10/10/2015   Severe mitral regurgitation    Shortness of breath    Acute congestive heart failure (HCC)    Left hip pain    Mitral regurgitation 10/02/2015   Paroxysmal atrial fibrillation (HCC) 10/01/2015   Acute respiratory failure with hypoxia and hypercapnia (HCC)    Respiratory distress 09/30/2015   Acute on chronic diastolic congestive heart failure, NYHA class 3 (HCC) 09/30/2015   Hx of fracture 04/26/2015   Diplopia 04/26/2015   Medicare annual wellness visit, subsequent 07/26/2014   Diarrhea 03/30/2014   Rhinitis 10/06/2013   Hx of vomiting 05/26/2013   Carotid artery disease without cerebral infarction (HCC) 03/22/2013   Hypertension, isolated systolic 03/22/2013   Bereavement 03/22/2013   Fracture, finger 07/13/2012   Neuritis 04/10/2012   Other constipation 03/31/2012   Hx of fall 03/31/2012   Esophageal disorder 11/27/2011   Atypical chest pain 11/27/2011   Dizziness 11/27/2011   Elevated blood pressure 03/25/2011   Subclavian steal syndrome 09/24/2010   GAIT IMBALANCE 03/19/2010   SKIN LESION, UNCERTAIN SIGNIFICANCE 09/18/2009   SLEEPLESSNESS 03/20/2009   HYPERGLYCEMIA 03/20/2009   Vitamin D  deficiency 11/09/2007   Allergic rhinitis 11/09/2007   LEG PAIN, BILATERAL 11/09/2007   Hypothyroidism 08/19/2007   HYPERLIPIDEMIA 08/19/2007   GERD 08/19/2007   Osteoarthritis 08/19/2007   Osteoporosis 08/19/2007   PCP:  Charlett [name], MD Pharmacy:   [facility] - [place], [place] - [address] [place] [address] Phone: [phone] Fax: [phone]  [facility] [id number] - [address], [place] - [address] [place] [address] Phone: [phone] Fax: [phone]   - [facility] [address] [place] [address] Phone: [phone] Fax: [phone]     Social Drivers of Health (SDOH) Social History: SDOH Screenings   Food Insecurity: No Food Insecurity (06/18/2024)  Housing: Low Risk  (06/18/2024)  Transportation Needs: No Transportation Needs (06/18/2024)  Utilities: Not At Risk (06/18/2024)  Alcohol  Screen: Low Risk  (01/04/2024)  Depression (PHQ2-9): Low Risk  (01/04/2024)  Financial Resource Strain: Low Risk  (01/04/2024)  Physical Activity: Insufficiently Active (01/04/2024)  Social Connections: Moderately Integrated (06/18/2024)  Stress: No Stress Concern Present (01/04/2024)  Tobacco Use: Medium Risk (06/17/2024)  Health Literacy: Adequate Health Literacy (01/04/2024)   SDOH Interventions:     Readmission Risk Interventions     No data to display

## 2024-06-20 NOTE — Progress Notes (Signed)
 Mobility Specialist Progress Note;   06/20/24 1012  Mobility  Activity Ambulated with assistance in hallway  Level of Assistance Contact guard assist, steadying assist  Assistive Device Front wheel walker  Distance Ambulated (ft) 200 ft  Activity Response Tolerated well  Mobility Referral Yes  Mobility visit 1 Mobility  Mobility Specialist Start Time (ACUTE ONLY) 1012  Mobility Specialist Stop Time (ACUTE ONLY) 1031  Mobility Specialist Time Calculation (min) (ACUTE ONLY) 19 min   Pt agreeable to mobility. Required light MinG assistance during ambulation for safety. VSS throughout and no c/o when asked. Pt returned back to sitting on EoB with all needs met, call bell in reach.   Lauraine Erm Mobility Specialist Please contact via SecureChat or [employer] [phone]

## 2024-06-20 NOTE — Progress Notes (Addendum)
 Rounding Note   Patient Name: Amy Fischer Date of Encounter: 06/20/2024  [facility] Cardiologist: Redell Shallow, MD   Subjective Sitting on side of bed after breakfast, no complaints  Scheduled Meds:  apixaban   2.5 mg Oral BID   artificial tears  1 drop Both Eyes BID   cholecalciferol   1,000 Units Oral Daily   cyanocobalamin   1,000 mcg Oral Daily   ezetimibe   10 mg Oral Daily   feeding supplement  237 mL Oral BID BM   furosemide   20 mg Oral Daily   ipratropium  1 spray Each Nare TID   levothyroxine   100 mcg Oral Q0600   melatonin  5 mg Oral QHS   metoprolol  succinate  25 mg Oral Daily   midodrine  5 mg Oral TID WC   rOPINIRole   0.25 mg Oral QHS   rosuvastatin   2.5 mg Oral Daily   Continuous Infusions:  PRN Meds: acetaminophen , ondansetron  (ZOFRAN ) IV   Vital Signs  Vitals:   06/20/24 0007 06/20/24 0423 06/20/24 0812 06/20/24 0831  BP: 96/65 (!) 92/58 (!) 78/56 (!) 93/54  Pulse: 91 90 94   Resp: 16 16 16    Temp: 97.8 F (36.6 C) 98 F (36.7 C) (!) 97.3 F (36.3 C)   TempSrc: Oral Oral Oral   SpO2: 93% 93% 97%   Weight:  46 kg    Height:        Intake/Output Summary (Last 24 hours) at 06/20/2024 0858 Last data filed at 06/20/2024 0545 Gross per 24 hour  Intake 680 ml  Output 600 ml  Net 80 ml      06/20/2024    4:23 AM 06/19/2024    5:14 AM 06/17/2024   10:33 PM  Last 3 Weights  Weight (lbs) 101 lb 8 oz 99 lb 14.4 oz 99 lb  Weight (kg) 46.04 kg 45.314 kg 44.906 kg      Telemetry Afib with rates in the 90-100s - Personally Reviewed  ECG  No new tracings - Personally Reviewed  Physical Exam  GEN: No acute distress.   Neck: No JVD Cardiac: irregular rhythm, regular rate Respiratory: Clear to auscultation bilaterally. GI: Soft, nontender, non-distended  MS: mild B LE edema; No deformity. Neuro:  Nonfocal  Psych: Normal affect   Labs High Sensitivity Troponin:   Recent Labs  Lab 06/19/24 0758 06/19/24 0820  TROPONINIHS 28*  33*     Chemistry Recent Labs  Lab 06/17/24 2239 06/19/24 0758 06/20/24 0653  NA 142 141 139  K 4.1 3.8 4.1  CL 103 102 102  CO2 26 26 26   GLUCOSE 162* 104* 98  BUN 23 21 26*  CREATININE 0.98 1.03* 1.04*  CALCIUM  10.0 9.1 9.4  MG  --  2.0  --   GFRNONAA 54* 51* 50*  ANIONGAP 13 13 11     Lipids No results for input(s): CHOL, TRIG, HDL, LABVLDL, LDLCALC, CHOLHDL in the last 168 hours.  Hematology Recent Labs  Lab 06/17/24 2239  WBC 7.4  RBC 3.97  HGB 13.1  HCT 41.2  MCV 103.8*  MCH 33.0  MCHC 31.8  RDW 14.2  PLT 160   Thyroid   Recent Labs  Lab 06/17/24 2239 06/18/24 0930  TSH 8.721*  --   FREET4  --  1.24*    BNP Recent Labs  Lab 06/17/24 2239 06/19/24 0758 06/20/24 0653  BNP 341.9* 588.0* 334.5*    DDimer No results for input(s): DDIMER in the last 168 hours.   Radiology  ECHOCARDIOGRAM COMPLETE Result Date: 06/18/2024    ECHOCARDIOGRAM REPORT   Patient Name:   Amy Fischer Date of Exam: 06/18/2024 Medical Rec #:  [id number]       Height:       61.0 in Accession #:    [id number]      Weight:       99.0 lb Date of Birth:  02-12-1929        BSA:          1.401 m Patient Age:    89 years        BP:           114/100 mmHg Patient Gender: F               HR:           92 bpm. Exam Location:  Inpatient Procedure: 2D Echo, Cardiac Doppler and Color Doppler (Both Spectral and Color            Flow Doppler were utilized during procedure). Indications:    CHF - Acute Diastolic I50.31  History:        Patient has prior history of Echocardiogram examinations, most                 recent 12/29/2022. CHF, CAD, COPD, Arrythmias:Atrial Fibrillation,                 Signs/Symptoms:Shortness of Breath; Risk Factors:Hypertension                 and Dyslipidemia.                  Mitral Valve: 28 mm Carpentier prosthetic annuloplasty ring                 valve is present in the mitral position. Procedure Date:                 10/10/2015.  Sonographer:    Thea Norlander  RCS Referring Phys: EMERY LITTIE FUSS IMPRESSIONS  1. Left ventricular ejection fraction, by estimation, is 65 to 70%. The left ventricle has normal function. The left ventricle has no regional wall motion abnormalities. Left ventricular diastolic parameters are indeterminate.  2. Right ventricular systolic function is low normal. The right ventricular size is mildly enlarged. There is moderately elevated pulmonary artery systolic pressure. The estimated right ventricular systolic pressure is 48.6 mmHg.  3. Left atrial size was mildly dilated.  4. Right atrial size was mildly dilated.  5. The mitral valve has been repaired/replaced. Mild to moderate mitral valve regurgitation. Mild mitral stenosis. The mean mitral valve gradient is 5.5 mmHg. There is a 28 mm Carpentier prosthetic annuloplasty ring present in the mitral position. Procedure Date: 10/10/2015.  6. Tricuspid valve regurgitation is severe.  7. The aortic valve is tricuspid. There is moderate calcification of the aortic valve. Aortic valve regurgitation is trivial. No aortic stenosis is present.  8. The inferior vena cava is normal in size with greater than 50% respiratory variability, suggesting right atrial pressure of 3 mmHg. FINDINGS  Left Ventricle: Left ventricular ejection fraction, by estimation, is 65 to 70%. The left ventricle has normal function. The left ventricle has no regional wall motion abnormalities. The left ventricular internal cavity size was normal in size. There is  no left ventricular hypertrophy. Left ventricular diastolic parameters are indeterminate. Right Ventricle: The right ventricular size is mildly enlarged. No increase in right ventricular wall thickness. Right ventricular systolic function is low  normal. There is moderately elevated pulmonary artery systolic pressure. The tricuspid regurgitant  velocity is 2.90 m/s, and with an assumed right atrial pressure of 15 mmHg, the estimated right ventricular systolic pressure is  48.6 mmHg. Left Atrium: Left atrial size was mildly dilated. Right Atrium: Right atrial size was mildly dilated. Pericardium: There is no evidence of pericardial effusion. Mitral Valve: The mitral valve has been repaired/replaced. Mild to moderate mitral valve regurgitation. There is a 28 mm Carpentier prosthetic annuloplasty ring present in the mitral position. Procedure Date: 10/10/2015. Mild mitral valve stenosis. MV peak gradient, 13.2 mmHg. The mean mitral valve gradient is 5.5 mmHg. Tricuspid Valve: The tricuspid valve is normal in structure. Tricuspid valve regurgitation is severe. No evidence of tricuspid stenosis. Aortic Valve: The aortic valve is tricuspid. There is moderate calcification of the aortic valve. Aortic valve regurgitation is trivial. No aortic stenosis is present. Aortic valve peak gradient measures 2.9 mmHg. Pulmonic Valve: The pulmonic valve was normal in structure. Pulmonic valve regurgitation is not visualized. No evidence of pulmonic stenosis. Aorta: The aortic root is normal in size and structure. Venous: The inferior vena cava is normal in size with greater than 50% respiratory variability, suggesting right atrial pressure of 3 mmHg. IAS/Shunts: No atrial level shunt detected by color flow Doppler.  LEFT VENTRICLE PLAX 2D LVIDd:         3.10 cm   Diastology LVIDs:         2.20 cm   LV e' medial:    7.40 cm/s LV PW:         0.80 cm   LV E/e' medial:  23.0 LV IVS:        0.60 cm   LV e' lateral:   9.03 cm/s LVOT diam:     2.00 cm   LV E/e' lateral: 18.8 LV SV:         25 LV SV Index:   18 LVOT Area:     3.14 cm  RIGHT VENTRICLE            IVC RV S prime:     9.36 cm/s  IVC diam: 2.25 cm TAPSE (M-mode): 1.1 cm LEFT ATRIUM             Index        RIGHT ATRIUM           Index LA diam:        4.10 cm 2.93 cm/m   RA Area:     14.60 cm LA Vol (A2C):   54.6 ml 38.98 ml/m  RA Volume:   32.30 ml  23.06 ml/m LA Vol (A4C):   51.2 ml 36.55 ml/m LA Biplane Vol: 53.7 ml 38.33 ml/m  AORTIC  VALVE AV Area (Vmax): 2.17 cm AV Vmax:        85.60 cm/s AV Peak Grad:   2.9 mmHg LVOT Vmax:      59.20 cm/s LVOT Vmean:     36.800 cm/s LVOT VTI:       0.080 m  AORTA Ao Root diam: 2.90 cm Ao Asc diam:  2.80 cm MITRAL VALVE                TRICUSPID VALVE MV Area (PHT): 3.27 cm     TR Peak grad:   33.6 mmHg MV Area VTI:   0.67 cm     TR Vmax:        290.00 cm/s MV Peak grad:  13.2 mmHg MV Mean grad:  5.5 mmHg     SHUNTS MV Vmax:       1.82 m/s     Systemic VTI:  0.08 m MV Vmean:      104.4 cm/s   Systemic Diam: 2.00 cm MV Decel Time: 232 msec MV E velocity: 170.00 cm/s MV A velocity: 98.50 cm/s MV E/A ratio:  1.73 Oneil Parchment MD Electronically signed by Oneil Parchment MD Signature Date/Time: 06/18/2024/4:22:48 PM    Final     Cardiac Studies  Echo 06/18/24:  1. Left ventricular ejection fraction, by estimation, is 65 to 70%. The  left ventricle has normal function. The left ventricle has no regional  wall motion abnormalities. Left ventricular diastolic parameters are  indeterminate.   2. Right ventricular systolic function is low normal. The right  ventricular size is mildly enlarged. There is moderately elevated  pulmonary artery systolic pressure. The estimated right ventricular  systolic pressure is 48.6 mmHg.   3. Left atrial size was mildly dilated.   4. Right atrial size was mildly dilated.   5. The mitral valve has been repaired/replaced. Mild to moderate mitral  valve regurgitation. Mild mitral stenosis. The mean mitral valve gradient  is 5.5 mmHg. There is a 28 mm Carpentier prosthetic annuloplasty ring  present in the mitral position.  Procedure Date: 10/10/2015.   6. Tricuspid valve regurgitation is severe.   7. The aortic valve is tricuspid. There is moderate calcification of the  aortic valve. Aortic valve regurgitation is trivial. No aortic stenosis is  present.   8. The inferior vena cava is normal in size with greater than 50%  respiratory variability, suggesting right atrial  pressure of 3 mmHg.   Patient Profile   89 y.o. female with a history of PAF, mitral valve repair on chronic anticoagulation who is admitted with bilateral edema.  Assessment & Plan   HFpEF - reassuring echo - now on 20 mg lasix  - mild B LED edema - she ambulates with walker and remains active at baseline (always on my feet) - BP marginal, continue midodrine   Mitral valve repair Severe TR - echo this admission with mild to moderate MR, mild MI - may be contributing to fluid   Atrial flutter, PAF Chronic anticoagulation - rate controlled on 25 mg toprol  - continue 2.5 mg eliquis  BID - question if fib/flutter contributing to fluid - no room for increased BB - will actually reduce this to 12.5 mg toprol  in the evenings -- discussed with Dr. Court - given volume overload and fib/flutter, will do a po amiodarone  load outpatient with 200 mg amiodarone  BID x 7 days then 200 mg daily  I will arrange close follow up. OK to discharge this afternoon.     For questions or updates, please contact [facility] Please consult [email or web address] for contact info under     Signed, Jon Nat Hails, PA  06/20/2024, 8:58 AM     Agree with note by Jon Hails PA-C  Patient admitted with heart failure with preserved EF.  History of mitral valve repair.  She was diuresed over 2 L.  She is in A-fib with ventricular sponsor in the low 100 range.  She appears euvolemic on exam.  She no longer has lower extreme edema.  Her lungs are clear.  I think at this point it is safe to discharge her.  Agree with starting amiodarone  with a 1 week load 200 mg p.o. twice daily followed by 200 mg a day to see if we can  chemically convert her.  She is on low-dose Eliquis .  She lives alone and appears to be independent.  Will arrange follow-up with an APP in 7 to 10 days and with Dr. Pietro after that.  Dorn DOROTHA Lesches, M.D., FACP, [facility], [facility] [address] [place], [place]  [address]  [phone] 06/20/2024 10:18 AM

## 2024-06-20 NOTE — Discharge Summary (Signed)
 Physician Discharge Summary   Patient: Amy Fischer MRN: [id number] DOB: [birth year]  Admit date:     06/17/2024  Discharge date: 06/20/24  Discharge Physician: Adriana DELENA Grams   PCP: Charlett [name], MD   Recommendations at discharge:    Follow-up with cardiology in 1 week Monitor your blood pressure at least once a day-keep a log log and present to cardiology       (Current medication subject to change by cardiology and PCP-for better heart rate control, improved blood pressure) Follow-up with PCP in 1-2 weeks Daily weights  Discharge Diagnoses: Principal Problem:   Acute hypoxic respiratory failure (HCC) Active Problems:   Chronic diastolic CHF (congestive heart failure), NYHA class 2 (HCC)   CAD (coronary artery disease)   Acute on chronic diastolic congestive heart failure, NYHA class 3 (HCC)   Paroxysmal atrial fibrillation (HCC)   Hypothyroidism   S/P MVR (mitral valve repair)   GERD   PAF (paroxysmal atrial fibrillation) (HCC)   COPD with acute exacerbation [facility])    Hospital Course:  Amy Fischer is a 89 y.o. female with medical history significant of diastolic dysfunction CHF, GERD, hyperlipidemia, osteoarthritis, osteoporosis, carotid artery stenosis, hypothyroidism, essential hypertension and esophageal spasm who was brought in from home secondary to sudden onset shortness of breath for about 2 hours.  She was having difficulty taking deep breath.  Especially with mild exertion.  Patient has reported history of COPD although not documented anywhere.  She has history of A-fib and when EMS arrived her heart rate was 140s to 160s in atrial fibrillation.  She was given 10 mg of Cardizem IV and the rate has dropped to between 80-100.  Patient also received pembrolizumab 150 cc from EMS.  In the ER she been evaluated and treated with a dose of IV Lasix .  Patient is no longer requiring oxygen but still weak.  Patient appears to be acute exacerbation of CHF versus COPD.      Acute hypoxic respiratory failure:  Resolved back on room air satting 97%  Likely due to congestive heart failure- HFpEF Improved, now satting 98% on room air Likely exacerbated by CHF exacerbation, and COPD - Managing underlying causes - Status post aggressive diuresis for volume overload, -I's and O's and daily weight was monitored closely during this admission - Symptomatic improvement with diuretics - Echo: Reviewed preserved ejection fraction, 65 to 70%.  Left ventricular diastolic parameters are  indeterminate.  Normal right RV systolic function, size is mildly enlarged. There is moderately elevated  pulmonary artery systolic pressure.   -IV Lasix  1 mg, changed to 20 mg p.o. daily - Added Entresto  24/25 mg p.o. twice daily-then Discontinued due to low blood pressure -Toprol  reduced to 12.5 mg -Midodrine increased 5 mg p.o. 3 times daily  Coronary artery disease: Stable.  Continue to monitor   Paroxysmal atrial fibrillation with RVR:  Discontinue Cardizem due to soft BP -Added Toprol -XL-dose reduced to 12.5 mg -Cardiology added Amiodarone  200 mg p.o. twice daily x 7 days then 200 p.o. daily - Continue Eliquis   HFpEF Management as above Heart failure treatment  Hyperlipidemia -continue statin  hypothyroidism: Continue with levothyroxine    Status post mitral valve repair: Continue to monitor   GERD: Continue with PPIs  Close follow-up with cardiology as an outpatient  Consultants: Neurology Procedures performed: 2D echocardiogram Disposition: Home Diet recommendation:  Discharge Diet Orders (From admission, onward)     Start     Ordered   06/20/24 0000  Diet -  low sodium heart healthy        06/20/24 1229           Cardiac diet DISCHARGE MEDICATION: Allergies as of 06/20/2024       Reactions   Codeine Nausea And Vomiting   Risedronate Sodium    Upset stomach. Could take Fosamax .   Statins    Muscles hurt, can take low dose   Tape Other (See  Comments)   Blisters, Please use paper tape   Amoxicillin -pot Clavulanate Diarrhea   Not allergic  2014, Pt can take z pack only        Medication List     STOP taking these medications    folic acid  1 MG tablet Commonly known as: FOLVITE        TAKE these medications    albuterol  108 (90 Base) MCG/ACT inhaler Commonly known as: VENTOLIN  HFA Inhale 2 puffs into the lungs every 6 (six) hours as needed for wheezing or shortness of breath.   amiodarone  200 MG tablet Commonly known as: PACERONE  Take 1 tablet (200 mg total) by mouth 2 (two) times daily for 7 days, THEN 1 tablet (200 mg total) daily. Start taking on: June 20, 2024   cholecalciferol  1000 units tablet Commonly known as: VITAMIN D  Take 1,000 Units by mouth daily.   cyanocobalamin  1000 MCG tablet Commonly known as: VITAMIN B12 Take 1,000 mcg by mouth daily.   Eliquis  2.5 MG Tabs tablet Generic drug: apixaban  TAKE 1 TABLET TWICE A DAY   ezetimibe  10 MG tablet Commonly known as: ZETIA  TAKE 1 TABLET DAILY   furosemide  20 MG tablet Commonly known as: LASIX  Take one tablet as needed for shortness of breathe and leg edema   ipratropium 0.06 % nasal spray Commonly known as: ATROVENT  USE 1 SPRAY IN EACH NOSTRIL TWICE A DAY AS NEEDED FOR RHINITIS   levothyroxine  100 MCG tablet Commonly known as: SYNTHROID  TAKE 1 TABLET EVERY MORNING ON AN EMPTY STOMACH What changed: See the new instructions.   melatonin 5 MG Tabs Take 5 mg by mouth at bedtime.   metoprolol  succinate 25 MG 24 hr tablet Commonly known as: TOPROL -XL Take 0.5 tablets (12.5 mg total) by mouth at bedtime. Start taking on: June 21, 2024   midodrine 5 MG tablet Commonly known as: PROAMATINE Take 1 tablet (5 mg total) by mouth 3 (three) times daily with meals.   Polyethyl Glycol-Propyl Glycol 0.4-0.3 % Soln Place 1 drop into both eyes 2 (two) times daily.   rOPINIRole  0.25 MG tablet Commonly known as: REQUIP  Take 1 tablet (0.25 mg  total) by mouth at bedtime.   rosuvastatin  5 MG tablet Commonly known as: CRESTOR  Take 0.5 tablets (2.5 mg total) by mouth daily.   SALINE NASAL SPRAY NA Place 1 spray into both nostrils in the morning and at bedtime.        Discharge Exam: Filed Weights   06/17/24 2233 06/19/24 0514 06/20/24 0423  Weight: 44.9 kg 45.3 kg 46 kg        General:  AAO x 3,  cooperative, no distress;   HEENT:  Normocephalic, PERRL, otherwise with in Normal limits   Neuro:  CNII-XII intact. , normal motor and sensation, reflexes intact   Lungs:   Clear to auscultation BL, Respirations unlabored,  No wheezes / crackles  Cardio:    S1/S2, RRR, No murmure, No Rubs or Gallops   Abdomen:  Soft, non-tender, bowel sounds active all four quadrants, no guarding or peritoneal signs.  Muscular  skeletal:  Limited exam -global generalized weaknesses - in bed, able to move all 4 extremities,   2+ pulses,  symmetric, No pitting edema  Skin:  Dry, warm to touch, negative for any Rashes,  Wounds: Please see nursing documentation          Condition at discharge: good  The results of significant diagnostics from this hospitalization (including imaging, microbiology, ancillary and laboratory) are listed below for reference.   Imaging Studies: ECHOCARDIOGRAM COMPLETE Result Date: 06/18/2024    ECHOCARDIOGRAM REPORT   Patient Name:   WESTLYN GLAZA Date of Exam: 06/18/2024 Medical Rec #:  [id number]       Height:       61.0 in Accession #:    [id number]      Weight:       99.0 lb Date of Birth:  07-16-1929        BSA:          1.401 m Patient Age:    89 years        BP:           114/100 mmHg Patient Gender: F               HR:           92 bpm. Exam Location:  Inpatient Procedure: 2D Echo, Cardiac Doppler and Color Doppler (Both Spectral and Color            Flow Doppler were utilized during procedure). Indications:    CHF - Acute Diastolic I50.31  History:        Patient has prior history of Echocardiogram  examinations, most                 recent 12/29/2022. CHF, CAD, COPD, Arrythmias:Atrial Fibrillation,                 Signs/Symptoms:Shortness of Breath; Risk Factors:Hypertension                 and Dyslipidemia.                  Mitral Valve: 28 mm Carpentier prosthetic annuloplasty ring                 valve is present in the mitral position. Procedure Date:                 10/10/2015.  Sonographer:    Thea Norlander RCS Referring Phys: EMERY LITTIE FUSS IMPRESSIONS  1. Left ventricular ejection fraction, by estimation, is 65 to 70%. The left ventricle has normal function. The left ventricle has no regional wall motion abnormalities. Left ventricular diastolic parameters are indeterminate.  2. Right ventricular systolic function is low normal. The right ventricular size is mildly enlarged. There is moderately elevated pulmonary artery systolic pressure. The estimated right ventricular systolic pressure is 48.6 mmHg.  3. Left atrial size was mildly dilated.  4. Right atrial size was mildly dilated.  5. The mitral valve has been repaired/replaced. Mild to moderate mitral valve regurgitation. Mild mitral stenosis. The mean mitral valve gradient is 5.5 mmHg. There is a 28 mm Carpentier prosthetic annuloplasty ring present in the mitral position. Procedure Date: 10/10/2015.  6. Tricuspid valve regurgitation is severe.  7. The aortic valve is tricuspid. There is moderate calcification of the aortic valve. Aortic valve regurgitation is trivial. No aortic stenosis is present.  8. The inferior vena cava is normal in size with greater than 50% respiratory variability, suggesting right  atrial pressure of 3 mmHg. FINDINGS  Left Ventricle: Left ventricular ejection fraction, by estimation, is 65 to 70%. The left ventricle has normal function. The left ventricle has no regional wall motion abnormalities. The left ventricular internal cavity size was normal in size. There is  no left ventricular hypertrophy. Left ventricular  diastolic parameters are indeterminate. Right Ventricle: The right ventricular size is mildly enlarged. No increase in right ventricular wall thickness. Right ventricular systolic function is low normal. There is moderately elevated pulmonary artery systolic pressure. The tricuspid regurgitant  velocity is 2.90 m/s, and with an assumed right atrial pressure of 15 mmHg, the estimated right ventricular systolic pressure is 48.6 mmHg. Left Atrium: Left atrial size was mildly dilated. Right Atrium: Right atrial size was mildly dilated. Pericardium: There is no evidence of pericardial effusion. Mitral Valve: The mitral valve has been repaired/replaced. Mild to moderate mitral valve regurgitation. There is a 28 mm Carpentier prosthetic annuloplasty ring present in the mitral position. Procedure Date: 10/10/2015. Mild mitral valve stenosis. MV peak gradient, 13.2 mmHg. The mean mitral valve gradient is 5.5 mmHg. Tricuspid Valve: The tricuspid valve is normal in structure. Tricuspid valve regurgitation is severe. No evidence of tricuspid stenosis. Aortic Valve: The aortic valve is tricuspid. There is moderate calcification of the aortic valve. Aortic valve regurgitation is trivial. No aortic stenosis is present. Aortic valve peak gradient measures 2.9 mmHg. Pulmonic Valve: The pulmonic valve was normal in structure. Pulmonic valve regurgitation is not visualized. No evidence of pulmonic stenosis. Aorta: The aortic root is normal in size and structure. Venous: The inferior vena cava is normal in size with greater than 50% respiratory variability, suggesting right atrial pressure of 3 mmHg. IAS/Shunts: No atrial level shunt detected by color flow Doppler.  LEFT VENTRICLE PLAX 2D LVIDd:         3.10 cm   Diastology LVIDs:         2.20 cm   LV e' medial:    7.40 cm/s LV PW:         0.80 cm   LV E/e' medial:  23.0 LV IVS:        0.60 cm   LV e' lateral:   9.03 cm/s LVOT diam:     2.00 cm   LV E/e' lateral: 18.8 LV SV:         25  LV SV Index:   18 LVOT Area:     3.14 cm  RIGHT VENTRICLE            IVC RV S prime:     9.36 cm/s  IVC diam: 2.25 cm TAPSE (M-mode): 1.1 cm LEFT ATRIUM             Index        RIGHT ATRIUM           Index LA diam:        4.10 cm 2.93 cm/m   RA Area:     14.60 cm LA Vol (A2C):   54.6 ml 38.98 ml/m  RA Volume:   32.30 ml  23.06 ml/m LA Vol (A4C):   51.2 ml 36.55 ml/m LA Biplane Vol: 53.7 ml 38.33 ml/m  AORTIC VALVE AV Area (Vmax): 2.17 cm AV Vmax:        85.60 cm/s AV Peak Grad:   2.9 mmHg LVOT Vmax:      59.20 cm/s LVOT Vmean:     36.800 cm/s LVOT VTI:       0.080 m  AORTA Ao Root diam: 2.90 cm Ao Asc diam:  2.80 cm MITRAL VALVE                TRICUSPID VALVE MV Area (PHT): 3.27 cm     TR Peak grad:   33.6 mmHg MV Area VTI:   0.67 cm     TR Vmax:        290.00 cm/s MV Peak grad:  13.2 mmHg MV Mean grad:  5.5 mmHg     SHUNTS MV Vmax:       1.82 m/s     Systemic VTI:  0.08 m MV Vmean:      104.4 cm/s   Systemic Diam: 2.00 cm MV Decel Time: 232 msec MV E velocity: 170.00 cm/s MV A velocity: 98.50 cm/s MV E/A ratio:  1.73 Oneil Parchment MD Electronically signed by Oneil Parchment MD Signature Date/Time: 06/18/2024/4:22:48 PM    Final    DG Chest Portable 1 View Result Date: 06/17/2024 CLINICAL DATA:  Shortness of breath EXAM: PORTABLE CHEST 1 VIEW COMPARISON:  11/13/2023, 12/28/2022, chest CT 12/28/2022 FINDINGS: Post sternotomy changes. Valve prosthesis. Cardiomegaly. Diffuse chronic bronchitic changes. No pleural effusion or pneumothorax. Possible mild patchy infiltrate at the right base. IMPRESSION: 1. Cardiomegaly without overt edema 2. Chronic bronchitic changes. Atelectasis or scarring left base. Possible mild patchy infiltrate at the right base. Electronically Signed   By: Luke Bun M.D.   On: 06/17/2024 22:55    Microbiology: Results for orders placed or performed during the hospital encounter of 12/28/22  Resp panel by RT-PCR (RSV, Flu A&B, Covid) Anterior Nasal Swab     Status: Abnormal    Collection Time: 12/28/22 10:27 AM   Specimen: Anterior Nasal Swab  Result Value Ref Range Status   SARS Coronavirus 2 by RT PCR POSITIVE (A) NEGATIVE Final    Comment: (NOTE) SARS-CoV-2 target nucleic acids are DETECTED.  The SARS-CoV-2 RNA is generally detectable in upper respiratory specimens during the acute phase of infection. Positive results are indicative of the presence of the identified virus, but do not rule out bacterial infection or co-infection with other pathogens not detected by the test. Clinical correlation with patient history and other diagnostic information is necessary to determine patient infection status. The expected result is Negative.  Fact Sheet for Patients: BloggerCourse.com  Fact Sheet for Healthcare Providers: SeriousBroker.it  This test is not yet approved or cleared by the [place]  FDA and  has been authorized for detection and/or diagnosis of SARS-CoV-2 by FDA under an Emergency Use Authorization (EUA).  This EUA will remain in effect (meaning this test can be used) for the duration of  the COVID-19 declaration under Section 564(b)(1) of the A ct, 21 U.S.C. section 360bbb-3(b)(1), unless the authorization is terminated or revoked sooner.     Influenza A by PCR NEGATIVE NEGATIVE Final   Influenza B by PCR NEGATIVE NEGATIVE Final    Comment: (NOTE) The Xpert Xpress SARS-CoV-2/FLU/RSV plus assay is intended as an aid in the diagnosis of influenza from Nasopharyngeal swab specimens and should not be used as a sole basis for treatment. Nasal washings and aspirates are unacceptable for Xpert Xpress SARS-CoV-2/FLU/RSV testing.  Fact Sheet for Patients: BloggerCourse.com  Fact Sheet for Healthcare Providers: SeriousBroker.it  This test is not yet approved or cleared by the [place]  FDA and has been authorized for detection and/or  diagnosis of SARS-CoV-2 by FDA under an Emergency Use Authorization (EUA). This EUA will remain in effect (meaning this test can be  used) for the duration of the COVID-19 declaration under Section 564(b)(1) of the Act, 21 U.S.C. section 360bbb-3(b)(1), unless the authorization is terminated or revoked.     Resp Syncytial Virus by PCR NEGATIVE NEGATIVE Final    Comment: (NOTE) Fact Sheet for Patients: BloggerCourse.com  Fact Sheet for Healthcare Providers: SeriousBroker.it  This test is not yet approved or cleared by the [place]  FDA and has been authorized for detection and/or diagnosis of SARS-CoV-2 by FDA under an Emergency Use Authorization (EUA). This EUA will remain in effect (meaning this test can be used) for the duration of the COVID-19 declaration under Section 564(b)(1) of the Act, 21 U.S.C. section 360bbb-3(b)(1), unless the authorization is terminated or revoked.  Performed at [facility], [address], [place], [place] [address]     Labs: CBC: Recent Labs  Lab 06/17/24 2239  WBC 7.4  HGB 13.1  HCT 41.2  MCV 103.8*  PLT 160   Basic Metabolic Panel: Recent Labs  Lab 06/17/24 2239 06/19/24 0758 06/20/24 0653  NA 142 141 139  K 4.1 3.8 4.1  CL 103 102 102  CO2 26 26 26   GLUCOSE 162* 104* 98  BUN 23 21 26*  CREATININE 0.98 1.03* 1.04*  CALCIUM  10.0 9.1 9.4  MG  --  2.0  --    Liver Function Tests: No results for input(s): AST, ALT, ALKPHOS, BILITOT, PROT, ALBUMIN  in the last 168 hours. CBG: No results for input(s): GLUCAP in the last 168 hours.  Discharge time spent: greater than 30 minutes.  Signed: Adriana DELENA Grams, MD [facility] 06/20/2024

## 2024-06-20 NOTE — Progress Notes (Signed)
 Heart Failure Navigator Progress Note  Assessed for Heart & Vascular TOC clinic readiness.  Patient does not meet criteria due to EF 65-70%. No HF TOC. .   Navigator will sign off at this time.   Randie Bustle, BSN, Scientist, clinical (histocompatibility and immunogenetics) Only

## 2024-06-21 ENCOUNTER — Other Ambulatory Visit: Payer: Self-pay

## 2024-06-21 ENCOUNTER — Other Ambulatory Visit (HOSPITAL_COMMUNITY): Payer: Self-pay

## 2024-06-21 DIAGNOSIS — J9601 Acute respiratory failure with hypoxia: Secondary | ICD-10-CM | POA: Diagnosis not present

## 2024-06-21 LAB — BASIC METABOLIC PANEL WITH GFR
Anion gap: 8 (ref 5–15)
BUN: 32 mg/dL — ABNORMAL HIGH (ref 8–23)
CO2: 27 mmol/L (ref 22–32)
Calcium: 9.2 mg/dL (ref 8.9–10.3)
Chloride: 102 mmol/L (ref 98–111)
Creatinine, Ser: 1 mg/dL (ref 0.44–1.00)
GFR, Estimated: 53 mL/min — ABNORMAL LOW (ref >60)
Glucose, Bld: 100 mg/dL — ABNORMAL HIGH (ref 70–99)
Potassium: 4.1 mmol/L (ref 3.5–5.1)
Sodium: 137 mmol/L (ref 135–145)

## 2024-06-21 LAB — BRAIN NATRIURETIC PEPTIDE: B Natriuretic Peptide: 374.1 pg/mL — ABNORMAL HIGH (ref 0.0–100.0)

## 2024-06-21 NOTE — Progress Notes (Signed)
 Discharge instructions (including medications) discussed with and copy provided to patient/caregiver

## 2024-06-21 NOTE — Discharge Summary (Signed)
 Physician Discharge Summary   Patient: Amy Fischer MRN: [id number] DOB: 10-Jul-1929  Admit date:     06/17/2024  Discharge date: 06/21/24  Discharge Physician: Adriana DELENA Grams   PCP: Charlett [name], MD   The patient was discharged yesterday, stayed overnight due to soft blood pressure, blood pressure much improved, heart rate has improved on current medication  Patient is deemed stable to be discharged home. Discussed with family on the phone.  Recommendations at discharge:    Follow-up with cardiology in 1 week Monitor your blood pressure at least once a day-keep a log log and present to cardiology       (Current medication subject to change by cardiology and PCP-for better heart rate control, improved blood pressure) Follow-up with PCP in 1-2 weeks Daily weights  Discharge Diagnoses: Principal Problem:   Acute hypoxic respiratory failure (HCC) Active Problems:   Chronic diastolic CHF (congestive heart failure), NYHA class 2 (HCC)   CAD (coronary artery disease)   Acute on chronic diastolic congestive heart failure, NYHA class 3 (HCC)   Paroxysmal atrial fibrillation (HCC)   Hypothyroidism   S/P MVR (mitral valve repair)   GERD   PAF (paroxysmal atrial fibrillation) (HCC)   COPD with acute exacerbation [facility])    Hospital Course:  Amy Fischer is a 89 y.o. female with medical history significant of diastolic dysfunction CHF, GERD, hyperlipidemia, osteoarthritis, osteoporosis, carotid artery stenosis, hypothyroidism, essential hypertension and esophageal spasm who was brought in from home secondary to sudden onset shortness of breath for about 2 hours.  She was having difficulty taking deep breath.  Especially with mild exertion.  Patient has reported history of COPD although not documented anywhere.  She has history of A-fib and when EMS arrived her heart rate was 140s to 160s in atrial fibrillation.  She was given 10 mg of Cardizem IV and the rate has dropped to between  80-100.  Patient also received pembrolizumab 150 cc from EMS.  In the ER she been evaluated and treated with a dose of IV Lasix .  Patient is no longer requiring oxygen but still weak.  Patient appears to be acute exacerbation of CHF versus COPD.     Acute hypoxic respiratory failure:  Resolved back on room air satting 97%  Likely due to congestive heart failure- HFpEF Improved, now satting 98% on room air Likely exacerbated by CHF exacerbation, and COPD - Managing underlying causes - Status post aggressive diuresis for volume overload, -I's and O's and daily weight was monitored closely during this admission - Symptomatic improvement with diuretics - Echo: Reviewed preserved ejection fraction, 65 to 70%.  Left ventricular diastolic parameters are  indeterminate.  Normal right RV systolic function, size is mildly enlarged. There is moderately elevated  pulmonary artery systolic pressure.   -IV Lasix  1 mg, changed to 20 mg p.o. daily - Added Entresto  24/25 mg p.o. twice daily-then Discontinued due to low blood pressure -Toprol  reduced to 12.5 mg -Midodrine increased 5 mg p.o. 3 times daily  Coronary artery disease: Stable.  Continue to monitor   Paroxysmal atrial fibrillation with RVR:  Discontinue Cardizem due to soft BP -Added Toprol -XL-dose reduced to 12.5 mg -Cardiology added Amiodarone  200 mg p.o. twice daily x 7 days then 200 p.o. daily - Continue Eliquis   HFpEF Management as above Heart failure treatment  Hyperlipidemia -continue statin  hypothyroidism: Continue with levothyroxine    Status post mitral valve repair: Continue to monitor   GERD: Continue with PPIs  Close follow-up with  cardiology as an outpatient  Consultants: Neurology Procedures performed: 2D echocardiogram Disposition: Home Diet recommendation:  Discharge Diet Orders (From admission, onward)     Start     Ordered   06/20/24 0000  Diet - low sodium heart healthy        06/20/24 1229            Cardiac diet DISCHARGE MEDICATION: Allergies as of 06/21/2024       Reactions   Codeine Nausea And Vomiting   Risedronate Sodium    Upset stomach. Could take Fosamax .   Statins    Muscles hurt, can take low dose   Tape Other (See Comments)   Blisters, Please use paper tape   Amoxicillin -pot Clavulanate Diarrhea   Not allergic  2014, Pt can take z pack only        Medication List     STOP taking these medications    folic acid  1 MG tablet Commonly known as: FOLVITE        TAKE these medications    albuterol  108 (90 Base) MCG/ACT inhaler Commonly known as: VENTOLIN  HFA Inhale 2 puffs into the lungs every 6 (six) hours as needed for wheezing or shortness of breath.   amiodarone  200 MG tablet Commonly known as: PACERONE  Take 1 tablet (200 mg total) by mouth 2 (two) times daily for 7 days, THEN 1 tablet (200 mg total) daily. Start taking on: June 20, 2024   cholecalciferol  1000 units tablet Commonly known as: VITAMIN D  Take 1,000 Units by mouth daily.   cyanocobalamin  1000 MCG tablet Commonly known as: VITAMIN B12 Take 1,000 mcg by mouth daily.   Eliquis  2.5 MG Tabs tablet Generic drug: apixaban  TAKE 1 TABLET TWICE A DAY   ezetimibe  10 MG tablet Commonly known as: ZETIA  TAKE 1 TABLET DAILY   furosemide  20 MG tablet Commonly known as: LASIX  Take one tablet as needed for shortness of breathe and leg edema   ipratropium 0.06 % nasal spray Commonly known as: ATROVENT  USE 1 SPRAY IN EACH NOSTRIL TWICE A DAY AS NEEDED FOR RHINITIS   levothyroxine  100 MCG tablet Commonly known as: SYNTHROID  TAKE 1 TABLET EVERY MORNING ON AN EMPTY STOMACH What changed: See the new instructions.   melatonin 5 MG Tabs Take 5 mg by mouth at bedtime.   metoprolol  succinate 25 MG 24 hr tablet Commonly known as: TOPROL -XL Take 1/2 tablet (12.5 mg total) by mouth at bedtime.   midodrine 5 MG tablet Commonly known as: PROAMATINE Take 1 tablet (5 mg total) by mouth 3  (three) times daily with meals.   Polyethyl Glycol-Propyl Glycol 0.4-0.3 % Soln Place 1 drop into both eyes 2 (two) times daily.   rOPINIRole  0.25 MG tablet Commonly known as: REQUIP  Take 1 tablet (0.25 mg total) by mouth at bedtime.   rosuvastatin  5 MG tablet Commonly known as: CRESTOR  Take 0.5 tablets (2.5 mg total) by mouth daily.   SALINE NASAL SPRAY NA Place 1 spray into both nostrils in the morning and at bedtime.        Discharge Exam: Filed Weights   06/19/24 0514 06/20/24 0423 06/21/24 0631  Weight: 45.3 kg 46 kg 44.8 kg         General:  AAO x 3,  cooperative, no distress;   HEENT:  Normocephalic, PERRL, otherwise with in Normal limits   Neuro:  CNII-XII intact. , normal motor and sensation, reflexes intact   Lungs:   Clear to auscultation BL, Respirations unlabored,  No wheezes /  crackles  Cardio:    S1/S2, RRR, No murmure, No Rubs or Gallops   Abdomen:  Soft, non-tender, bowel sounds active all four quadrants, no guarding or peritoneal signs.  Muscular  skeletal:  Limited exam -global generalized weaknesses - in bed, able to move all 4 extremities,   2+ pulses,  symmetric, No pitting edema  Skin:  Dry, warm to touch, negative for any Rashes,  Wounds: Please see nursing documentation             Condition at discharge: good  The results of significant diagnostics from this hospitalization (including imaging, microbiology, ancillary and laboratory) are listed below for reference.   Imaging Studies: ECHOCARDIOGRAM COMPLETE Result Date: 06/18/2024    ECHOCARDIOGRAM REPORT   Patient Name:   Amy Fischer Date of Exam: 06/18/2024 Medical Rec #:  [id number]       Height:       61.0 in Accession #:    [id number]      Weight:       99.0 lb Date of Birth:  [birth year]        BSA:          1.401 m Patient Age:    89 years        BP:           114/100 mmHg Patient Gender: F               HR:           92 bpm. Exam Location:  Inpatient Procedure: 2D Echo,  Cardiac Doppler and Color Doppler (Both Spectral and Color            Flow Doppler were utilized during procedure). Indications:    CHF - Acute Diastolic I50.31  History:        Patient has prior history of Echocardiogram examinations, most                 recent 12/29/2022. CHF, CAD, COPD, Arrythmias:Atrial Fibrillation,                 Signs/Symptoms:Shortness of Breath; Risk Factors:Hypertension                 and Dyslipidemia.                  Mitral Valve: 28 mm Carpentier prosthetic annuloplasty ring                 valve is present in the mitral position. Procedure Date:                 10/10/2015.  Sonographer:    Thea Norlander RCS Referring Phys: EMERY LITTIE FUSS IMPRESSIONS  1. Left ventricular ejection fraction, by estimation, is 65 to 70%. The left ventricle has normal function. The left ventricle has no regional wall motion abnormalities. Left ventricular diastolic parameters are indeterminate.  2. Right ventricular systolic function is low normal. The right ventricular size is mildly enlarged. There is moderately elevated pulmonary artery systolic pressure. The estimated right ventricular systolic pressure is 48.6 mmHg.  3. Left atrial size was mildly dilated.  4. Right atrial size was mildly dilated.  5. The mitral valve has been repaired/replaced. Mild to moderate mitral valve regurgitation. Mild mitral stenosis. The mean mitral valve gradient is 5.5 mmHg. There is a 28 mm Carpentier prosthetic annuloplasty ring present in the mitral position. Procedure Date: 10/10/2015.  6. Tricuspid valve regurgitation is severe.  7. The aortic valve is  tricuspid. There is moderate calcification of the aortic valve. Aortic valve regurgitation is trivial. No aortic stenosis is present.  8. The inferior vena cava is normal in size with greater than 50% respiratory variability, suggesting right atrial pressure of 3 mmHg. FINDINGS  Left Ventricle: Left ventricular ejection fraction, by estimation, is 65 to 70%. The  left ventricle has normal function. The left ventricle has no regional wall motion abnormalities. The left ventricular internal cavity size was normal in size. There is  no left ventricular hypertrophy. Left ventricular diastolic parameters are indeterminate. Right Ventricle: The right ventricular size is mildly enlarged. No increase in right ventricular wall thickness. Right ventricular systolic function is low normal. There is moderately elevated pulmonary artery systolic pressure. The tricuspid regurgitant  velocity is 2.90 m/s, and with an assumed right atrial pressure of 15 mmHg, the estimated right ventricular systolic pressure is 48.6 mmHg. Left Atrium: Left atrial size was mildly dilated. Right Atrium: Right atrial size was mildly dilated. Pericardium: There is no evidence of pericardial effusion. Mitral Valve: The mitral valve has been repaired/replaced. Mild to moderate mitral valve regurgitation. There is a 28 mm Carpentier prosthetic annuloplasty ring present in the mitral position. Procedure Date: 10/10/2015. Mild mitral valve stenosis. MV peak gradient, 13.2 mmHg. The mean mitral valve gradient is 5.5 mmHg. Tricuspid Valve: The tricuspid valve is normal in structure. Tricuspid valve regurgitation is severe. No evidence of tricuspid stenosis. Aortic Valve: The aortic valve is tricuspid. There is moderate calcification of the aortic valve. Aortic valve regurgitation is trivial. No aortic stenosis is present. Aortic valve peak gradient measures 2.9 mmHg. Pulmonic Valve: The pulmonic valve was normal in structure. Pulmonic valve regurgitation is not visualized. No evidence of pulmonic stenosis. Aorta: The aortic root is normal in size and structure. Venous: The inferior vena cava is normal in size with greater than 50% respiratory variability, suggesting right atrial pressure of 3 mmHg. IAS/Shunts: No atrial level shunt detected by color flow Doppler.  LEFT VENTRICLE PLAX 2D LVIDd:         3.10 cm    Diastology LVIDs:         2.20 cm   LV e' medial:    7.40 cm/s LV PW:         0.80 cm   LV E/e' medial:  23.0 LV IVS:        0.60 cm   LV e' lateral:   9.03 cm/s LVOT diam:     2.00 cm   LV E/e' lateral: 18.8 LV SV:         25 LV SV Index:   18 LVOT Area:     3.14 cm  RIGHT VENTRICLE            IVC RV S prime:     9.36 cm/s  IVC diam: 2.25 cm TAPSE (M-mode): 1.1 cm LEFT ATRIUM             Index        RIGHT ATRIUM           Index LA diam:        4.10 cm 2.93 cm/m   RA Area:     14.60 cm LA Vol (A2C):   54.6 ml 38.98 ml/m  RA Volume:   32.30 ml  23.06 ml/m LA Vol (A4C):   51.2 ml 36.55 ml/m LA Biplane Vol: 53.7 ml 38.33 ml/m  AORTIC VALVE AV Area (Vmax): 2.17 cm AV Vmax:  85.60 cm/s AV Peak Grad:   2.9 mmHg LVOT Vmax:      59.20 cm/s LVOT Vmean:     36.800 cm/s LVOT VTI:       0.080 m  AORTA Ao Root diam: 2.90 cm Ao Asc diam:  2.80 cm MITRAL VALVE                TRICUSPID VALVE MV Area (PHT): 3.27 cm     TR Peak grad:   33.6 mmHg MV Area VTI:   0.67 cm     TR Vmax:        290.00 cm/s MV Peak grad:  13.2 mmHg MV Mean grad:  5.5 mmHg     SHUNTS MV Vmax:       1.82 m/s     Systemic VTI:  0.08 m MV Vmean:      104.4 cm/s   Systemic Diam: 2.00 cm MV Decel Time: 232 msec MV E velocity: 170.00 cm/s MV A velocity: 98.50 cm/s MV E/A ratio:  1.73 Oneil Parchment MD Electronically signed by Oneil Parchment MD Signature Date/Time: 06/18/2024/4:22:48 PM    Final    DG Chest Portable 1 View Result Date: 06/17/2024 CLINICAL DATA:  Shortness of breath EXAM: PORTABLE CHEST 1 VIEW COMPARISON:  11/13/2023, 12/28/2022, chest CT 12/28/2022 FINDINGS: Post sternotomy changes. Valve prosthesis. Cardiomegaly. Diffuse chronic bronchitic changes. No pleural effusion or pneumothorax. Possible mild patchy infiltrate at the right base. IMPRESSION: 1. Cardiomegaly without overt edema 2. Chronic bronchitic changes. Atelectasis or scarring left base. Possible mild patchy infiltrate at the right base. Electronically Signed   By: Luke Bun M.D.   On: 06/17/2024 22:55    Microbiology: Results for orders placed or performed during the hospital encounter of 12/28/22  Resp panel by RT-PCR (RSV, Flu A&B, Covid) Anterior Nasal Swab     Status: Abnormal   Collection Time: 12/28/22 10:27 AM   Specimen: Anterior Nasal Swab  Result Value Ref Range Status   SARS Coronavirus 2 by RT PCR POSITIVE (A) NEGATIVE Final    Comment: (NOTE) SARS-CoV-2 target nucleic acids are DETECTED.  The SARS-CoV-2 RNA is generally detectable in upper respiratory specimens during the acute phase of infection. Positive results are indicative of the presence of the identified virus, but do not rule out bacterial infection or co-infection with other pathogens not detected by the test. Clinical correlation with patient history and other diagnostic information is necessary to determine patient infection status. The expected result is Negative.  Fact Sheet for Patients: BloggerCourse.com  Fact Sheet for Healthcare Providers: SeriousBroker.it  This test is not yet approved or cleared by the [place]  FDA and  has been authorized for detection and/or diagnosis of SARS-CoV-2 by FDA under an Emergency Use Authorization (EUA).  This EUA will remain in effect (meaning this test can be used) for the duration of  the COVID-19 declaration under Section 564(b)(1) of the A ct, 21 U.S.C. section 360bbb-3(b)(1), unless the authorization is terminated or revoked sooner.     Influenza A by PCR NEGATIVE NEGATIVE Final   Influenza B by PCR NEGATIVE NEGATIVE Final    Comment: (NOTE) The Xpert Xpress SARS-CoV-2/FLU/RSV plus assay is intended as an aid in the diagnosis of influenza from Nasopharyngeal swab specimens and should not be used as a sole basis for treatment. Nasal washings and aspirates are unacceptable for Xpert Xpress SARS-CoV-2/FLU/RSV testing.  Fact Sheet for  Patients: BloggerCourse.com  Fact Sheet for Healthcare Providers: SeriousBroker.it  This test is not yet  approved or cleared by the [place]  FDA and has been authorized for detection and/or diagnosis of SARS-CoV-2 by FDA under an Emergency Use Authorization (EUA). This EUA will remain in effect (meaning this test can be used) for the duration of the COVID-19 declaration under Section 564(b)(1) of the Act, 21 U.S.C. section 360bbb-3(b)(1), unless the authorization is terminated or revoked.     Resp Syncytial Virus by PCR NEGATIVE NEGATIVE Final    Comment: (NOTE) Fact Sheet for Patients: BloggerCourse.com  Fact Sheet for Healthcare Providers: SeriousBroker.it  This test is not yet approved or cleared by the [place]  FDA and has been authorized for detection and/or diagnosis of SARS-CoV-2 by FDA under an Emergency Use Authorization (EUA). This EUA will remain in effect (meaning this test can be used) for the duration of the COVID-19 declaration under Section 564(b)(1) of the Act, 21 U.S.C. section 360bbb-3(b)(1), unless the authorization is terminated or revoked.  Performed at [facility], [address] [place], [place] [address]     Labs: CBC: Recent Labs  Lab 06/17/24 2239  WBC 7.4  HGB 13.1  HCT 41.2  MCV 103.8*  PLT 160   Basic Metabolic Panel: Recent Labs  Lab 06/17/24 2239 06/19/24 0758 06/20/24 0653 06/21/24 0432  NA 142 141 139 137  K 4.1 3.8 4.1 4.1  CL 103 102 102 102  CO2 26 26 26 27   GLUCOSE 162* 104* 98 100*  BUN 23 21 26* 32*  CREATININE 0.98 1.03* 1.04* 1.00  CALCIUM  10.0 9.1 9.4 9.2  MG  --  2.0  --   --    Liver Function Tests: No results for input(s): AST, ALT, ALKPHOS, BILITOT, PROT, ALBUMIN  in the last 168 hours. CBG: No results for input(s): GLUCAP in the last 168 hours.  Discharge time  spent: greater than 30 minutes.  Signed: Adriana DELENA Grams, MD [facility] 06/21/2024

## 2024-06-22 ENCOUNTER — Ambulatory Visit: Admitting: Internal Medicine

## 2024-06-22 ENCOUNTER — Telehealth: Payer: Self-pay

## 2024-06-23 ENCOUNTER — Telehealth: Payer: Self-pay | Admitting: *Deleted

## 2024-06-23 ENCOUNTER — Telehealth: Payer: Self-pay

## 2024-06-23 DIAGNOSIS — R278 Other lack of coordination: Secondary | ICD-10-CM | POA: Diagnosis not present

## 2024-06-23 DIAGNOSIS — M1990 Unspecified osteoarthritis, unspecified site: Secondary | ICD-10-CM | POA: Diagnosis not present

## 2024-06-23 DIAGNOSIS — Z9181 History of falling: Secondary | ICD-10-CM | POA: Diagnosis not present

## 2024-06-23 DIAGNOSIS — G629 Polyneuropathy, unspecified: Secondary | ICD-10-CM | POA: Diagnosis not present

## 2024-06-23 NOTE — Patient Instructions (Signed)
 Visit Information  Thank you for taking time to visit with me today. Please don't hesitate to contact me if I can be of assistance to you before our next scheduled telephone appointment.  Our next appointment is by telephone on 06/30/24 at 11am  Following is a copy of your care plan:   Goals Addressed             This Visit's Progress    VBCI Transitions of Care (TOC) Care Plan       Problems:  Recent Hospitalization for treatment of Acute hypoxic respiratory failure  Medication management barrier TOC RN attempted to review medications with patient who states she is normally manages her medications on her own but with new medications she is confused and states her daughter, who has been away, is coming back today and is going to review her medications with her - patient agreed for [facility] RN to follow up tomorrow 06/23/24; 06/23/24 Update: Medications reviewed with patient who states her daughter came and set up pill boxes and her daughter in law (who is a Teacher, early years/pre) also came and put notes on patient's discharge instructions to help patient understand the rationale and instructions for her medications - *during medication reconciliation Patient stated she has not used her albuterol  inhaler in 2-3 years - [facility] RN asked patient if she would have used it prior to admission for Acute Respiratory Failure if she had it and patent stated, I haven't used it in so long I didn't even think about it - [facility] RN called office of PCP, Ap[facility] at [phone] and spoke with Burnard and was made aware that patient does not have current prescription for Albuterol  inhaler and that she uses [facility] RN also advised patient to take discharge papers to her MD appointments for review and to speak with cardiologist about need for prescriptions to be sent to [facility] if she is to continue her new medications.   Goal:  Over the next 30 days, the patient will not experience hospital  readmission  Interventions:  Transitions of Care: Doctor Visits  - discussed the importance of doctor visits Attempted medication review - unsuccessful - TOC RN called patient's daughter's number provided by patient and daughter's husband answered and said his wife/patient's daughter is returning home today and he will make sure she knows that patient is confused about her medications and that she goes to see the patient today to review medications. 06/23/24 Update: See Above note Reviewed in detail importance of daily weights and calling MD with 2lb weight gain overnight or 5lb in a week and taking these readings and BP readings to cardiology office as stated on discharge instructions - Patient has cardiology appointment 06/27/24 with Artist Pouch PA - TOC RN educated patient that Metoprolol  has many helpful benefits but can result in lower BP - patient also prescribed Midodrine and TOC RN explained that medication helps raise BP - Advised patient to discuss with cardiology and patient states her Home health PT made the same suggestion - TOC RN referenced discharge instructions that state, (Current medication subject to change by cardiology and PCP-for better heart rate control, improved blood pressure) Reviewed with patient report of constipation - educated patients on high fiber, avoid caffeine, drink water , warm prune juice and risk of hospitalization   AFIB Interventions:   Assessed social determinant of health barriers   Heart Failure Interventions: Basic overview and discussion of pathophysiology of Heart Failure reviewed Provided education on low sodium diet Assessed  need for readable accurate scales in home Provided education about placing scale on hard, flat surface Advised patient to weigh each morning after emptying bladder Discussed importance of daily weight and advised patient to weigh and record daily Reviewed role of diuretics in prevention of fluid overload and management of  heart failure; Discussed the importance of keeping all appointments with provider  Patient Self Care Activities:  Attend all scheduled provider appointments Call pharmacy for medication refills 3-7 days in advance of running out of medications Call provider office for new concerns or questions  Notify RN Care Manager of TOC call rescheduling needs Participate in Transition of Care Program/Attend TOC scheduled calls Take medications as prescribed   call office if I gain more than 2 pounds in one day or 5 pounds in one week keep legs up while sitting track weight in diary use salt in moderation watch for swelling in feet, ankles and legs every day weigh myself daily check pulse (heart) rate before taking medicine - Patient had not been checking BP - states she will begin  make a plan to eat healthy Call MD if no BM after 1 day of following education as stated above   Plan:  Telephone follow up appointment with care management team member scheduled for:  06/30/24 11am with covering TOC RN, Dionne Leath The patient has been provided with contact information for the care management team and has been advised to call with any health related questions or concerns.         Patient verbalizes understanding of instructions and care plan provided today and agrees to view in MyChart. Active MyChart status and patient understanding of how to access instructions and care plan via MyChart confirmed with patient.     Telephone follow up appointment with care management team member scheduled qnm:[id number] follow up appointment with care management team member scheduled for:  06/30/24 11am with covering TOC RN, Dionne Leath The patient has been provided with contact information for the care management team and has been advised to call with any health related questions or concerns.   Please call the care guide team at [phone] if you need to cancel or reschedule your appointment.   Please call the  Suicide and Crisis Lifeline: 988 call 1-800-273-TALK (toll free, 24 hour hotline) call 911 if you are experiencing a Mental Health or Behavioral Health Crisis or need someone to talk to.  Shona Prow RN, CCM [place]  VBCI-Population Health RN Care Manager [phone]

## 2024-06-23 NOTE — Telephone Encounter (Signed)
 Reason for CRM: [facility] transitions of care nurse called in after doing call with patient that was released form hospital on June 24,2025 after being diagnosed with  Acute respiratory failure.She stated that patient hasn't used albuterol  inhaler in 2 to 3 years. She Would like to know if she could be represcribed inhaler     proalbuterol (VENTOLIN  HFA) 108 (90 Base) MCG/ACT inhaler? She stated that she thinks that if she was using her inhaler properly it may have kept her out of the hospital.        [facility] - [place], [place] - [address]    Phone: [phone]    Fax: [phone]
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
## 2024-06-27 ENCOUNTER — Ambulatory Visit: Attending: Cardiology | Admitting: Cardiology

## 2024-06-27 VITALS — BP 130/78 | HR 101 | Ht 61.0 in | Wt 103.0 lb

## 2024-06-27 DIAGNOSIS — I480 Paroxysmal atrial fibrillation: Secondary | ICD-10-CM | POA: Insufficient documentation

## 2024-06-27 DIAGNOSIS — I340 Nonrheumatic mitral (valve) insufficiency: Secondary | ICD-10-CM | POA: Insufficient documentation

## 2024-06-27 DIAGNOSIS — I071 Rheumatic tricuspid insufficiency: Secondary | ICD-10-CM | POA: Diagnosis not present

## 2024-06-27 DIAGNOSIS — I5032 Chronic diastolic (congestive) heart failure: Secondary | ICD-10-CM | POA: Diagnosis not present

## 2024-06-27 DIAGNOSIS — I10 Essential (primary) hypertension: Secondary | ICD-10-CM | POA: Diagnosis not present

## 2024-06-27 MED ORDER — FUROSEMIDE 20 MG PO TABS
ORAL_TABLET | ORAL | 1 refills | Status: DC
Start: 2024-06-27 — End: 2024-07-04

## 2024-06-27 MED ORDER — MIDODRINE HCL 5 MG PO TABS: 5.0000 mg | ORAL_TABLET | Freq: Three times a day (TID) | ORAL | 0 refills | Status: AC

## 2024-06-27 MED ORDER — APIXABAN 2.5 MG PO TABS: 2.5000 mg | ORAL_TABLET | Freq: Two times a day (BID) | ORAL | 1 refills | Status: DC

## 2024-06-27 MED ORDER — AMIODARONE HCL 200 MG PO TABS: ORAL_TABLET | ORAL | 0 refills | Status: DC

## 2024-06-27 NOTE — Telephone Encounter (Signed)
 Spoke to pt. Inform pt of provider's advise. Pt verbalized understanding.

## 2024-06-27 NOTE — Patient Instructions (Addendum)
 Medication Instructions:  Your refills have been sent to your pharmacies   *If you need a refill on your cardiac medications before your next appointment, please call your pharmacy*   Lab Work: Return on Thursday for lab work....................... BMET If you have labs (blood work) drawn today and your tests are completely normal, you will receive your results only by: MyChart Message (if you have MyChart) OR A paper copy in the mail If you have any lab test that is abnormal or we need to change your treatment, we will call you to review the results.   Testing/Procedures: No procedures were ordered during today's visit.    Follow-Up: At [facility], you and your health needs are our priority.  As part of our continuing mission to provide you with exceptional heart care, we have created designated Provider Care Teams.  These Care Teams include your primary Cardiologist (physician) and Advanced Practice Providers (APPs -  Physician Assistants and Nurse Practitioners) who all work together to provide you with the care you need, when you need it.  We recommend signing up for the patient portal called MyChart.  Sign up information is provided on this After Visit Summary.  MyChart is used to connect with patients for Virtual Visits (Telemedicine).  Patients are able to view lab/test results, encounter notes, upcoming appointments, etc.  Non-urgent messages can be sent to your provider as well.   To learn more about what you can do with MyChart, go to [email or web address].    Your next appointment:   1 month(s)  Provider:   Artist Pouch, PA-C          Other Instructions Thank you for choosing [facility]!

## 2024-06-27 NOTE — Progress Notes (Signed)
 Cardiology Office Note:   Date:  06/27/2024  ID:  ELIZABET Fischer, DOB [birth year], MRN [id number] PCP: Charlett [name], MD  [facility] Providers Cardiologist:  Redell Shallow, MD    History of Present Illness:   Discussed the use of AI scribe software for clinical note transcription with the patient, who gave verbal consent to proceed.  History of Present Illness Amy Fischer is a 89 year old female with paroxysmal atrial fibrillation, s/p mitral valve repair, chronic heart failure with preserved EF who presents with ongoing shortness of breath and lower extremity edema after recent admission.   She has a history of paroxysmal atrial fibrillation following mitral valve repair, nonobstructive coronary artery disease, hypertension, and hyperlipidemia. She was recently hospitalized on June 18, 2024, for worsening lower extremity edema and shortness of breath. During that admission, she was found to be in atrial fibrillation with rapid ventricular response and received IV Lasix  and metoprolol . An echocardiogram showed a left ventricular ejection fraction of 65-70%, moderately elevated pulmonary artery systolic pressure, mild to moderate mitral valve regurgitation, and severe tricuspid regurgitation. She was started on oral amiodarone  for atrial flutter and atrial fibrillation management. Also discharged on PRN lasix  20mg .  Since discharge, her shortness of breath has continued. Reports fluctuant symptoms, at times even similar to when she called 911 prior to her recent hospitalization. There seems to be some exertional component to her dyspnea. Her daughter who is also with patient in office today feels that patient has been overdoing at home (patient lives independently). No chest discomfort, but she experiences occasional dizziness. No palpitations or racing heart sensation with afib. She feels short of breath with movement and uses an adjustable bed to sleep elevated, maintaining the same  position each night. She has not noticed a significant improvement in breathing on days she takes Lasix .  She is currently taking amiodarone  once daily and Lasix  as needed, though she plans to take Lasix  today due to swollen feet. She has taken Lasix  two or three times since hospital discharge, prompted by swelling in her feet, which makes it difficult to find shoes. She is also on Eliquis , taken after breakfast and dinner, and has not missed any doses.  She lives alone and manages her daily activities but acknowledges she may have been overdoing it. She monitors her blood pressure and weight daily, noting a weight of 102 pounds today, slightly down from her last recorded weight.   Studies Reviewed:    EKG:   EKG Interpretation Date/Time:  Monday June 27 2024 15:28:24 EDT Ventricular Rate:  97 PR Interval:    QRS Duration:  88 QT Interval:  396 QTC Calculation: 502 R Axis:   64  Text Interpretation: Atrial flutter with variable A-V block with premature ventricular or aberrantly conducted complexes Nonspecific ST abnormality Prolonged QT When compared with ECG of 19-Jun-2024 06:03, Atrial flutter has replaced Atrial fibrillation Confirmed by Trudy Birmingham [phone]) on 06/27/2024 3:44:37 PM   06/18/24 TTE  IMPRESSIONS     1. Left ventricular ejection fraction, by estimation, is 65 to 70%. The  left ventricle has normal function. The left ventricle has no regional  wall motion abnormalities. Left ventricular diastolic parameters are  indeterminate.   2. Right ventricular systolic function is low normal. The right  ventricular size is mildly enlarged. There is moderately elevated  pulmonary artery systolic pressure. The estimated right ventricular  systolic pressure is 48.6 mmHg.   3. Left atrial size was mildly dilated.  4. Right atrial size was mildly dilated.   5. The mitral valve has been repaired/replaced. Mild to moderate mitral  valve regurgitation. Mild mitral stenosis. The  mean mitral valve gradient  is 5.5 mmHg. There is a 28 mm Carpentier prosthetic annuloplasty ring  present in the mitral position.  Procedure Date: 10/10/2015.   6. Tricuspid valve regurgitation is severe.   7. The aortic valve is tricuspid. There is moderate calcification of the  aortic valve. Aortic valve regurgitation is trivial. No aortic stenosis is  present.   8. The inferior vena cava is normal in size with greater than 50%  respiratory variability, suggesting right atrial pressure of 3 mmHg.   FINDINGS   Left Ventricle: Left ventricular ejection fraction, by estimation, is 65  to 70%. The left ventricle has normal function. The left ventricle has no  regional wall motion abnormalities. The left ventricular internal cavity  size was normal in size. There is   no left ventricular hypertrophy. Left ventricular diastolic parameters  are indeterminate.   Right Ventricle: The right ventricular size is mildly enlarged. No  increase in right ventricular wall thickness. Right ventricular systolic  function is low normal. There is moderately elevated pulmonary artery  systolic pressure. The tricuspid regurgitant   velocity is 2.90 m/s, and with an assumed right atrial pressure of 15  mmHg, the estimated right ventricular systolic pressure is 48.6 mmHg.   Left Atrium: Left atrial size was mildly dilated.   Right Atrium: Right atrial size was mildly dilated.   Pericardium: There is no evidence of pericardial effusion.   Mitral Valve: The mitral valve has been repaired/replaced. Mild to  moderate mitral valve regurgitation. There is a 28 mm Carpentier  prosthetic annuloplasty ring present in the mitral position. Procedure  Date: 10/10/2015. Mild mitral valve stenosis. MV  peak gradient, 13.2 mmHg. The mean mitral valve gradient is 5.5 mmHg.   Tricuspid Valve: The tricuspid valve is normal in structure. Tricuspid  valve regurgitation is severe. No evidence of tricuspid stenosis.    Aortic Valve: The aortic valve is tricuspid. There is moderate  calcification of the aortic valve. Aortic valve regurgitation is trivial.  No aortic stenosis is present. Aortic valve peak gradient measures 2.9  mmHg.   Pulmonic Valve: The pulmonic valve was normal in structure. Pulmonic valve  regurgitation is not visualized. No evidence of pulmonic stenosis.   Aorta: The aortic root is normal in size and structure.   Venous: The inferior vena cava is normal in size with greater than 50%  respiratory variability, suggesting right atrial pressure of 3 mmHg.   IAS/Shunts: No atrial level shunt detected by color flow Doppler.   Risk Assessment/Calculations:    CHA2DS2-VASc Score = 5   This indicates a 7.2% annual risk of stroke. The patient's score is based upon: CHF History: 0 HTN History: 1 Diabetes History: 0 Stroke History: 0 Vascular Disease History: 1 Age Score: 2 Gender Score: 1             Physical Exam:   VS:  BP 130/78 (BP Location: Right Arm)   Pulse (!) 101   Ht 5' 1 (1.549 m)   Wt 103 lb (46.7 kg)   SpO2 98%   BMI 19.46 kg/m    Wt Readings from Last 3 Encounters:  06/27/24 103 lb (46.7 kg)  06/21/24 98 lb 11.2 oz (44.8 kg)  03/09/24 98 lb 12.8 oz (44.8 kg)     Physical Exam Vitals reviewed.  Constitutional:  Appearance: Normal appearance.  HENT:     Head: Normocephalic.     Nose: Nose normal.   Eyes:     Pupils: Pupils are equal, round, and reactive to light.    Cardiovascular:     Rate and Rhythm: Normal rate and regular rhythm.     Pulses: Normal pulses.     Heart sounds: Normal heart sounds. No murmur heard.    No friction rub. No gallop.  Pulmonary:     Effort: Pulmonary effort is normal.     Breath sounds: Normal breath sounds.  Abdominal:     General: Abdomen is flat.   Musculoskeletal:     Right lower leg: Edema (2+) present.     Left lower leg: Edema (2+) present.   Skin:    General: Skin is warm and dry.      Capillary Refill: Capillary refill takes less than 2 seconds.   Neurological:     General: No focal deficit present.     Mental Status: She is alert and oriented to person, place, and time.   Psychiatric:        Mood and Affect: Mood normal.        Behavior: Behavior normal.        Thought Content: Thought content normal.        Judgment: Judgment normal.      ASSESSMENT AND PLAN:    Assessment & Plan Atrial fibrillation and atrial flutter Atrial fibrillation and atrial flutter with rapid ventricular response, contributing to acute and chronic congestive heart failure. Previously on amiodarone  post-mitral valve repair, discontinued due to potential toxicity concerns. Currently on amiodarone  again, aiding in rhythm control. Cardioversion was considered during recent hospitalization but not performed due to improvement with diuretics and medications. Reports feeling at times as bad as when admitted to the hospital, raising concern for rhythm-related symptoms. EKG shows atrial flutter, treatable with cardioversion. Discussed risks and benefits of cardioversion, including potential for arrhythmia induction and need for continuous Eliquis  dosing to avoid transesophageal echocardiogram. Prefers to try increased diuretic therapy before considering cardioversion. - Continue amiodarone  200 mg once daily. - Continue Toprol  XL 12.5mg  daily along with Midodrine  5mg  TID (started inpatient to support BP, do not think she will require long-term and will re-evaluate at follow up).  - Consider cardioversion if symptoms persist despite diuretic therapy. - Ensure continuous dosing of Eliquis  to avoid need for transesophageal echocardiogram before cardioversion.  Congestive heart failure Acute and chronic congestive heart failure with symptoms of shortness of breath and lower extremity edema. Symptoms improved with diuretics during hospitalization. Current symptoms suggest fluid overload, possibly exacerbated  by atrial fibrillation and atrial flutter. Reports intermittent shortness of breath and swelling in legs. - Increase Lasix  to 20 mg daily in the morning. - Monitor weight and blood pressure daily. - Order lab tests for kidney function and potassium level on Thursday 7/3. - Consider additional 20 mg Lasix  if weight gain of 3 pounds in 24 hours.  Severe tricuspid regurgitation Severe tricuspid regurgitation contributing to symptoms of fluid overload and shortness of breath. Surgical intervention not recommended due to age. Management focuses on symptom control with medications. - Manage symptoms with diuretics and monitor for fluid overload.  Hypertension Recently with low normal BP. Midodrine  was used in the hospital to prevent hypotension but is not intended for long-term use. - Continue metoprolol  as prescribed. - Monitor blood pressure daily. - Ideally will discontinue midodrine  in one month if blood pressure remains stable.  Nonobstructive  coronary artery disease Nonobstructive coronary artery disease with no current symptoms of chest pain or discomfort. Managed with risk factor modification and medications. - Continue current management and medications.  Hyperlipidemia Hyperlipidemia managed with medications. No discussion of changes in lipid management during this visit. - Continue current lipid-lowering therapy.  1 month follow up.           Signed, Artist Pouch, PA-C

## 2024-06-28 DIAGNOSIS — R278 Other lack of coordination: Secondary | ICD-10-CM | POA: Diagnosis not present

## 2024-06-28 DIAGNOSIS — G629 Polyneuropathy, unspecified: Secondary | ICD-10-CM | POA: Diagnosis not present

## 2024-06-28 DIAGNOSIS — M1990 Unspecified osteoarthritis, unspecified site: Secondary | ICD-10-CM | POA: Diagnosis not present

## 2024-06-28 DIAGNOSIS — Z9181 History of falling: Secondary | ICD-10-CM | POA: Diagnosis not present

## 2024-06-30 ENCOUNTER — Telehealth: Payer: Self-pay

## 2024-06-30 DIAGNOSIS — I480 Paroxysmal atrial fibrillation: Secondary | ICD-10-CM | POA: Diagnosis not present

## 2024-07-01 LAB — BASIC METABOLIC PANEL WITH GFR
BUN/Creatinine Ratio: 17 (ref 12–28)
BUN: 22 mg/dL (ref 10–36)
CO2: 25 mmol/L (ref 20–29)
Calcium: 9.8 mg/dL (ref 8.7–10.3)
Chloride: 103 mmol/L (ref 96–106)
Creatinine, Ser: 1.29 mg/dL — ABNORMAL HIGH (ref 0.57–1.00)
Glucose: 77 mg/dL (ref 70–99)
Potassium: 4.7 mmol/L (ref 3.5–5.2)
Sodium: 140 mmol/L (ref 134–144)
eGFR: 39 mL/min/1.73 — ABNORMAL LOW (ref >59)

## 2024-07-04 ENCOUNTER — Ambulatory Visit: Payer: Self-pay | Admitting: Cardiology

## 2024-07-04 DIAGNOSIS — R7989 Other specified abnormal findings of blood chemistry: Secondary | ICD-10-CM

## 2024-07-04 DIAGNOSIS — Z79899 Other long term (current) drug therapy: Secondary | ICD-10-CM

## 2024-07-04 MED ORDER — FUROSEMIDE 20 MG PO TABS: 20.0000 mg | ORAL_TABLET | ORAL | 1 refills | Status: DC

## 2024-07-04 NOTE — Addendum Note (Signed)
 Addended by: GLADIS PORTER HERO on: 07/04/2024 10:35 AM   Modules accepted: Orders

## 2024-07-04 NOTE — Telephone Encounter (Signed)
 The patient has been notified of the result and verbalized understanding.  All questions (if any) were answered.  Pt aware that per Artist Pouch PA-C, he wants her to hold her lasix  for 2 days only, then decrease her lasix  to taking it every other day thereafter.   Advised that it is still important to monitor her weights/swelling daily, and she may take a dose of lasix  on her off days as needed for lower extremity edema or weight gain of 3 lbs in 24 hr time period or increase of 5 lbs in a weeks time.   Pt verbalized understanding and agrees with this plan.

## 2024-07-04 NOTE — Telephone Encounter (Signed)
-----   Message from Artist Pouch sent at 07/04/2024  7:44 AM EDT ----- Her kidney function has decreased slightly from last check. Let's have her take a 2 day break from Lasix . After 2 day break, lets try having her take her lasix  every other day. It will still be  important to monitor weight/swelling and she can still take a dose on her off day if either worsens. I suspect that this will be a happy medium between the as-needed schedule she was previously  following and current daily schedule.  ----- Message ----- From: Interface, [facility] Lab Results In Sent: 07/01/2024   1:35 AM EDT To: Artist Pouch, PA-C

## 2024-07-04 NOTE — Telephone Encounter (Signed)
 Secure chat received from Evan Williams PA-C with additional recommendations regarding this pts lab results.   Per Artist Pouch PA-C, he forgot to add that he wants to recheck a BMET on this pt in one week, given we changed her dosing of lasix  around and to reassess her renal function.   Called the pt and her daughter Recardo (on [place]) answered.   Informed Recardo of pts lab results and that Artist Pouch PA-C wanted to recheck her BMET in one week to reassess things.   BMET in one week order placed and released.   Recardo verbalized understanding and agrees with this plan.  Recardo states she will share the additional plan with the pt.

## 2024-07-05 ENCOUNTER — Encounter: Payer: Self-pay | Admitting: Internal Medicine

## 2024-07-05 ENCOUNTER — Other Ambulatory Visit: Payer: Self-pay | Admitting: Internal Medicine

## 2024-07-05 ENCOUNTER — Ambulatory Visit: Admitting: Internal Medicine

## 2024-07-05 VITALS — BP 120/78 | HR 99 | Temp 97.7°F | Ht 61.0 in | Wt 100.6 lb

## 2024-07-05 DIAGNOSIS — G2581 Restless legs syndrome: Secondary | ICD-10-CM | POA: Diagnosis not present

## 2024-07-05 DIAGNOSIS — Z79899 Other long term (current) drug therapy: Secondary | ICD-10-CM | POA: Diagnosis not present

## 2024-07-05 DIAGNOSIS — I480 Paroxysmal atrial fibrillation: Secondary | ICD-10-CM | POA: Diagnosis not present

## 2024-07-05 DIAGNOSIS — Z09 Encounter for follow-up examination after completed treatment for conditions other than malignant neoplasm: Secondary | ICD-10-CM

## 2024-07-05 DIAGNOSIS — R278 Other lack of coordination: Secondary | ICD-10-CM | POA: Diagnosis not present

## 2024-07-05 DIAGNOSIS — E039 Hypothyroidism, unspecified: Secondary | ICD-10-CM

## 2024-07-05 DIAGNOSIS — I5032 Chronic diastolic (congestive) heart failure: Secondary | ICD-10-CM

## 2024-07-05 DIAGNOSIS — Z9181 History of falling: Secondary | ICD-10-CM | POA: Diagnosis not present

## 2024-07-05 DIAGNOSIS — M1990 Unspecified osteoarthritis, unspecified site: Secondary | ICD-10-CM | POA: Diagnosis not present

## 2024-07-05 DIAGNOSIS — G629 Polyneuropathy, unspecified: Secondary | ICD-10-CM | POA: Diagnosis not present

## 2024-07-05 MED ORDER — ROPINIROLE HCL 0.25 MG PO TABS: 0.2500 mg | ORAL_TABLET | Freq: Every day | ORAL | 0 refills | Status: DC

## 2024-07-05 NOTE — Patient Instructions (Signed)
 Good to see you  today   Plan thryoid testing early September   but if getting labs though cardiology ask to see if we can do at the same time.  Refilled  requip  today . [facility] days to [facility]. SABRA

## 2024-07-05 NOTE — Transitions of Care (Post Inpatient/ED Visit) (Signed)
 07/05/2024  Patient ID: Amy Fischer, female   DOB: 1929/03/14, 89 y.o.   MRN: [id number]  TOC case closed:  Unable to maintain contact with patient . No answer X3 attempts.  Alan Ee, RN, BSN, CEN [employer]- Transition of Care Team.  [facility] [phone]

## 2024-07-05 NOTE — Progress Notes (Signed)
 Chief Complaint  Patient presents with   Medical Management of Chronic Issues    Pt is here with daughter. She reports needs a refill on requip  [facility].rx is helping with restless legs.  Was seen with her cardiologist.    Hospitalization Follow-up    HPI: Amy Fischer 89 y.o. come in for fu  hospital  for act resp failure  chf a fib   Doing ok here with daughter Velta  Working on getting weight up  winded with house hold chores she wants to do   and told by family she is doing too much.  No recent fall Had first fu with cards   and now on  Lasix  every other day   and to have lab bmp  was told a bit too dry.  Requip  was begun in hospital and she wants refill as states it helps her rls a lot.  Thyroid   thinks was taking through out  has been off and on amiodarone .   ROS: See pertinent positives and negatives per HPI.  Past Medical History:  Diagnosis Date   Allergy    Anemia    Asymptomatic carotid artery stenosis    R ICA 40% stenosis on CTA 12/2011.   Bladder polyps    Cataract    BILATERAL-REMOVED   CHF (congestive heart failure) (HCC)    Diverticulosis    Elevated blood pressure 03/25/2011   Bp readings borderline today has hx of elvation  In office and ok at home   Not checke recently   She gfeels was elevated from anxiety     Episodic recurrent vertigo    MRI Head 2003   Esophageal spasm    GERD (gastroesophageal reflux disease)    Hepatic hemangioma    History of hepatitis    unknown type   Hyperlipidemia    Hyperplastic colon polyp    Hypothyroidism    Intestinal metaplasia of gastric mucosa    Osteoarthritis    Osteoporosis    Patellar fracture 07/13/2012   Scapular fracture 03/31/2012   small from direct blow with trip     Subclavian steal syndrome    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; CTA 2013 revealed open graft    Family History  Problem Relation Age of Onset   Hypertension Mother    Stroke Mother    Heart disease  Father    Colon cancer Neg Hx    Neurofibromatosis Neg Hx    Allergic rhinitis Neg Hx    Asthma Neg Hx    Angioedema Neg Hx    Atopy Neg Hx    Eczema Neg Hx    Immunodeficiency Neg Hx    Urticaria Neg Hx     Social History   Socioeconomic History   Marital status: Widowed    Spouse name: Not on file   Number of children: 4   Years of education: Not on file   Highest education level: Bachelor's degree (e.g., BA, AB, BS)  Occupational History   Occupation: retired Runner, broadcasting/film/video  Tobacco Use   Smoking status: Former    Current packs/day: 0.00    Average packs/day: 0.8 packs/day for 20.0 years (15.0 ttl pk-yrs)    Types: Cigarettes    Start date: 01/05/1971    Quit date: 01/05/1991    Years since quitting: 33.5   Smokeless tobacco: Never  Vaping Use   Vaping status: Never Used  Substance and Sexual Activity   Alcohol   use: Not Currently    Alcohol /week: 4.0 standard drinks of alcohol     Types: 4 Glasses of wine per week    Comment: socially   Drug use: No   Sexual activity: Not on file  Other Topics Concern   Not on file  Social History Narrative   Widowed.  Lives alone in a one story home.  Has 4 children (3 living).  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    HH of 1    No pets   Former smoker   Exercises regularly   Right handed   Social Drivers of Corporate investment banker Strain: Low Risk  (07/04/2024)   Overall Financial Resource Strain (CARDIA)    Difficulty of Paying Living Expenses: Not hard at all  Food Insecurity: No Food Insecurity (07/04/2024)   Hunger Vital Sign    Worried About Running Out of Food in the Last Year: Never true    Ran Out of Food in the Last Year: Never true  Transportation Needs: No Transportation Needs (07/04/2024)   PRAPARE - Administrator, Civil Service (Medical): No    Lack of Transportation (Non-Medical): No  Physical Activity: Insufficiently Active (07/04/2024)   Exercise Vital Sign    Days of Exercise per Week: 2 days    Minutes of  Exercise per Session: 50 min  Stress: No Stress Concern Present (07/04/2024)   [employer] of Occupational Health - Occupational Stress Questionnaire    Feeling of Stress: Only a little  Social Connections: Moderately Integrated (07/04/2024)   Social Connection and Isolation Panel    Frequency of Communication with Friends and Family: More than three times a week    Frequency of Social Gatherings with Friends and Family: Three times a week    Attends Religious Services: More than 4 times per year    Active Member of Clubs or Organizations: Yes    Attends Banker Meetings: More than 4 times per year    Marital Status: Widowed    Outpatient Medications Prior to Visit  Medication Sig Dispense Refill   amiodarone  (PACERONE ) 200 MG tablet Take one tablet daily 30 tablet 0   apixaban  (ELIQUIS ) 2.5 MG TABS tablet Take 1 tablet (2.5 mg total) by mouth 2 (two) times daily. 180 tablet 1   cholecalciferol  (VITAMIN D ) 1000 units tablet Take 1,000 Units by mouth daily.     ezetimibe  (ZETIA ) 10 MG tablet TAKE 1 TABLET DAILY 90 tablet 3   furosemide  (LASIX ) 20 MG tablet Take 1 tablet (20 mg total) by mouth every other day. You may take a dose on your off days as needed for swelling or weight gain of 3lbs in 24hrs or 5lbs in a week. 90 tablet 1   ipratropium (ATROVENT ) 0.06 % nasal spray USE 1 SPRAY IN EACH NOSTRIL TWICE A DAY AS NEEDED FOR RHINITIS 45 mL 3   levothyroxine  (SYNTHROID ) 100 MCG tablet TAKE 1 TABLET EVERY MORNING ON AN EMPTY STOMACH 90 tablet 3   melatonin 5 MG TABS Take 5 mg by mouth at bedtime.     metoprolol  succinate (TOPROL -XL) 25 MG 24 hr tablet Take 1/2 tablet (12.5 mg total) by mouth at bedtime. 15 tablet 1   midodrine  (PROAMATINE ) 5 MG tablet Take 1 tablet (5 mg total) by mouth 3 (three) times daily with meals. 90 tablet 0   Polyethyl Glycol-Propyl Glycol 0.4-0.3 % SOLN Place 1 drop into both eyes 2 (two) times daily.     rosuvastatin  (CRESTOR ) 5  MG tablet Take 0.5  tablets (2.5 mg total) by mouth daily. 90 tablet 3   SALINE NASAL SPRAY NA Place 1 spray into both nostrils in the morning and at bedtime.     vitamin B-12 (CYANOCOBALAMIN ) 1000 MCG tablet Take 1,000 mcg by mouth daily.     rOPINIRole  (REQUIP ) 0.25 MG tablet Take 1 tablet (0.25 mg total) by mouth at bedtime. 30 tablet 0   albuterol  (VENTOLIN  HFA) 108 (90 Base) MCG/ACT inhaler Inhale 2 puffs into the lungs every 6 (six) hours as needed for wheezing or shortness of breath. 18 g 2   No facility-administered medications prior to visit.     EXAM:  BP 120/78 (BP Location: Left Arm, Patient Position: Sitting, Cuff Size: Normal)   Pulse 99   Temp 97.7 F (36.5 C) (Oral)   Ht 5' 1 (1.549 m)   Wt 100 lb 9.6 oz (45.6 kg)   SpO2 94%   BMI 19.01 kg/m   Body mass index is 19.01 kg/m. Has walker  Wt Readings from Last 3 Encounters:  07/05/24 100 lb 9.6 oz (45.6 kg)  06/27/24 103 lb (46.7 kg)  06/21/24 98 lb 11.2 oz (44.8 kg)    GENERAL: vitals reviewed and listed above, alert, oriented, appears well hydrated and in no acute distress walker  no resp distress  HEENT: atraumatic, conjunctiva  clear, no obvious abnormalities on inspection of external nose and ears NECK: no obvious masses on inspection palpation  LUNGS: clear to auscultation bilaterally, no wheezes, rales or rhonchi,  BS =  CV: HRRIR, no clubbing feet toes purplish but no cyanosis  puffy feet legs  but no anasarca  MS: moves all extremities without noticeable focal  abnormality  OA changes  PSYCH: pleasant and cooperative, no obvious depression or anxiety Lab Results  Component Value Date   WBC 7.4 06/17/2024   HGB 13.1 06/17/2024   HCT 41.2 06/17/2024   PLT 160 06/17/2024   GLUCOSE 77 06/30/2024   CHOL 143 11/04/2022   TRIG 83.0 11/04/2022   HDL 60.60 11/04/2022   LDLDIRECT 76.0 08/19/2019   LDLCALC 65 11/04/2022   ALT 14 11/13/2023   AST 19 11/13/2023   NA 140 06/30/2024   K 4.7 06/30/2024   CL 103 06/30/2024    CREATININE 1.29 (H) 06/30/2024   BUN 22 06/30/2024   CO2 25 06/30/2024   TSH 8.721 (H) 06/17/2024   INR 1.0 09/19/2022   HGBA1C 5.1 10/14/2016   BP Readings from Last 3 Encounters:  07/05/24 120/78  06/27/24 130/78  06/21/24 107/72    ASSESSMENT AND PLAN:  Discussed the following assessment and plan:  Hospital discharge follow-up  PAF (paroxysmal atrial fibrillation) (HCC)  RLS (restless legs syndrome)  Hypothyroidism, unspecified type  Medication management  Chronic diastolic CHF (congestive heart failure), NYHA class 2 (HCC) TSH elevated off and on amiodarone .  Will cont 100 mcg levothyroxine  and check tsh in early September unless can add on to  other labs  in interim .  Optimize nutrition   discussion and aware . Fall prevention. Plan fu appt in Nov or as indicated  Close fu with cards is optimum Glad that requip  has helped  can have drug ia an se be benefit seems more than risk today  Disc  activity contact emergency  encouragement  to plan .  -Patient advised to return or notify health care team  if  new concerns arise.  Patient Instructions  Good to see you  today   Plan  thryoid testing early September   but if getting labs though cardiology ask to see if we can do at the same time.  Refilled  requip  today . [facility]. .  Dwana Garin K. Makara Lanzo M.D.

## 2024-07-06 ENCOUNTER — Encounter (HOSPITAL_COMMUNITY): Payer: Self-pay

## 2024-07-06 ENCOUNTER — Inpatient Hospital Stay (HOSPITAL_COMMUNITY)
Admission: EM | Admit: 2024-07-06 | Discharge: 2024-07-09 | DRG: 291 | Disposition: A | Discharge status: Home / Self Care | Attending: Internal Medicine | Admitting: Internal Medicine

## 2024-07-06 ENCOUNTER — Other Ambulatory Visit: Payer: Self-pay

## 2024-07-06 ENCOUNTER — Emergency Department (HOSPITAL_COMMUNITY)

## 2024-07-06 DIAGNOSIS — I10 Essential (primary) hypertension: Secondary | ICD-10-CM | POA: Diagnosis present

## 2024-07-06 DIAGNOSIS — E039 Hypothyroidism, unspecified: Secondary | ICD-10-CM | POA: Diagnosis present

## 2024-07-06 DIAGNOSIS — I2510 Atherosclerotic heart disease of native coronary artery without angina pectoris: Secondary | ICD-10-CM | POA: Diagnosis present

## 2024-07-06 DIAGNOSIS — I517 Cardiomegaly: Secondary | ICD-10-CM | POA: Diagnosis not present

## 2024-07-06 DIAGNOSIS — I160 Hypertensive urgency: Secondary | ICD-10-CM | POA: Diagnosis not present

## 2024-07-06 DIAGNOSIS — I4892 Unspecified atrial flutter: Secondary | ICD-10-CM | POA: Diagnosis not present

## 2024-07-06 DIAGNOSIS — Z9889 Other specified postprocedural states: Secondary | ICD-10-CM | POA: Diagnosis not present

## 2024-07-06 DIAGNOSIS — I6521 Occlusion and stenosis of right carotid artery: Secondary | ICD-10-CM | POA: Diagnosis present

## 2024-07-06 DIAGNOSIS — I081 Rheumatic disorders of both mitral and tricuspid valves: Secondary | ICD-10-CM | POA: Diagnosis not present

## 2024-07-06 DIAGNOSIS — J9811 Atelectasis: Secondary | ICD-10-CM | POA: Diagnosis not present

## 2024-07-06 DIAGNOSIS — G2581 Restless legs syndrome: Secondary | ICD-10-CM | POA: Diagnosis present

## 2024-07-06 DIAGNOSIS — Z79890 Hormone replacement therapy (postmenopausal): Secondary | ICD-10-CM

## 2024-07-06 DIAGNOSIS — M810 Age-related osteoporosis without current pathological fracture: Secondary | ICD-10-CM | POA: Diagnosis present

## 2024-07-06 DIAGNOSIS — I499 Cardiac arrhythmia, unspecified: Secondary | ICD-10-CM | POA: Diagnosis not present

## 2024-07-06 DIAGNOSIS — Z66 Do not resuscitate: Secondary | ICD-10-CM | POA: Diagnosis not present

## 2024-07-06 DIAGNOSIS — N1831 Chronic kidney disease, stage 3a: Secondary | ICD-10-CM | POA: Diagnosis present

## 2024-07-06 DIAGNOSIS — I509 Heart failure, unspecified: Principal | ICD-10-CM

## 2024-07-06 DIAGNOSIS — I4891 Unspecified atrial fibrillation: Secondary | ICD-10-CM

## 2024-07-06 DIAGNOSIS — N179 Acute kidney failure, unspecified: Secondary | ICD-10-CM

## 2024-07-06 DIAGNOSIS — E785 Hyperlipidemia, unspecified: Secondary | ICD-10-CM | POA: Diagnosis present

## 2024-07-06 DIAGNOSIS — Z8249 Family history of ischemic heart disease and other diseases of the circulatory system: Secondary | ICD-10-CM

## 2024-07-06 DIAGNOSIS — R0603 Acute respiratory distress: Secondary | ICD-10-CM | POA: Diagnosis not present

## 2024-07-06 DIAGNOSIS — R54 Age-related physical debility: Secondary | ICD-10-CM | POA: Diagnosis present

## 2024-07-06 DIAGNOSIS — I5033 Acute on chronic diastolic (congestive) heart failure: Secondary | ICD-10-CM | POA: Diagnosis present

## 2024-07-06 DIAGNOSIS — I2781 Cor pulmonale (chronic): Secondary | ICD-10-CM | POA: Diagnosis present

## 2024-07-06 DIAGNOSIS — Z7901 Long term (current) use of anticoagulants: Secondary | ICD-10-CM | POA: Diagnosis not present

## 2024-07-06 DIAGNOSIS — I110 Hypertensive heart disease with heart failure: Secondary | ICD-10-CM | POA: Diagnosis not present

## 2024-07-06 DIAGNOSIS — Z79899 Other long term (current) drug therapy: Secondary | ICD-10-CM

## 2024-07-06 DIAGNOSIS — Z885 Allergy status to narcotic agent status: Secondary | ICD-10-CM

## 2024-07-06 DIAGNOSIS — I130 Hypertensive heart and chronic kidney disease with heart failure and stage 1 through stage 4 chronic kidney disease, or unspecified chronic kidney disease: Secondary | ICD-10-CM | POA: Diagnosis present

## 2024-07-06 DIAGNOSIS — R0602 Shortness of breath: Secondary | ICD-10-CM | POA: Diagnosis not present

## 2024-07-06 DIAGNOSIS — Z9049 Acquired absence of other specified parts of digestive tract: Secondary | ICD-10-CM

## 2024-07-06 DIAGNOSIS — K219 Gastro-esophageal reflux disease without esophagitis: Secondary | ICD-10-CM | POA: Diagnosis not present

## 2024-07-06 DIAGNOSIS — R062 Wheezing: Secondary | ICD-10-CM | POA: Diagnosis not present

## 2024-07-06 DIAGNOSIS — R0902 Hypoxemia: Secondary | ICD-10-CM | POA: Diagnosis not present

## 2024-07-06 DIAGNOSIS — R918 Other nonspecific abnormal finding of lung field: Secondary | ICD-10-CM | POA: Diagnosis not present

## 2024-07-06 DIAGNOSIS — R000 Tachycardia, unspecified: Secondary | ICD-10-CM | POA: Diagnosis not present

## 2024-07-06 DIAGNOSIS — J90 Pleural effusion, not elsewhere classified: Secondary | ICD-10-CM | POA: Diagnosis not present

## 2024-07-06 DIAGNOSIS — R069 Unspecified abnormalities of breathing: Secondary | ICD-10-CM | POA: Diagnosis not present

## 2024-07-06 DIAGNOSIS — Z87891 Personal history of nicotine dependence: Secondary | ICD-10-CM | POA: Diagnosis not present

## 2024-07-06 DIAGNOSIS — Z860100 Personal history of colon polyps, unspecified: Secondary | ICD-10-CM

## 2024-07-06 DIAGNOSIS — I4819 Other persistent atrial fibrillation: Secondary | ICD-10-CM | POA: Diagnosis present

## 2024-07-06 DIAGNOSIS — K224 Dyskinesia of esophagus: Secondary | ICD-10-CM | POA: Diagnosis present

## 2024-07-06 DIAGNOSIS — Z888 Allergy status to other drugs, medicaments and biological substances status: Secondary | ICD-10-CM

## 2024-07-06 HISTORY — DX: Chronic diastolic (congestive) heart failure: I50.32

## 2024-07-06 LAB — TROPONIN I (HIGH SENSITIVITY)
Troponin I (High Sensitivity): 10 ng/L (ref <18)
Troponin I (High Sensitivity): 12 ng/L (ref <18)

## 2024-07-06 LAB — CBC WITH DIFFERENTIAL/PLATELET
Abs Immature Granulocytes: 0.12 K/uL — ABNORMAL HIGH (ref 0.00–0.07)
Basophils Absolute: 0 K/uL (ref 0.0–0.1)
Basophils Relative: 1 %
Eosinophils Absolute: 0.1 K/uL (ref 0.0–0.5)
Eosinophils Relative: 1 %
HCT: 42.4 % (ref 36.0–46.0)
Hemoglobin: 13.3 g/dL (ref 12.0–15.0)
Immature Granulocytes: 2 %
Lymphocytes Relative: 12 %
Lymphs Abs: 0.9 K/uL (ref 0.7–4.0)
MCH: 33 pg (ref 26.0–34.0)
MCHC: 31.4 g/dL (ref 30.0–36.0)
MCV: 105.2 fL — ABNORMAL HIGH (ref 80.0–100.0)
Monocytes Absolute: 0.7 K/uL (ref 0.1–1.0)
Monocytes Relative: 10 %
Neutro Abs: 5.7 K/uL (ref 1.7–7.7)
Neutrophils Relative %: 74 %
Platelets: 169 K/uL (ref 150–400)
RBC: 4.03 MIL/uL (ref 3.87–5.11)
RDW: 14.6 % (ref 11.5–15.5)
WBC: 7.5 K/uL (ref 4.0–10.5)
nRBC: 0 % (ref 0.0–0.2)

## 2024-07-06 LAB — RESPIRATORY PANEL BY PCR

## 2024-07-06 LAB — COMPREHENSIVE METABOLIC PANEL WITH GFR
ALT: 15 U/L (ref 0–44)
AST: 24 U/L (ref 15–41)
Albumin: 3.4 g/dL — ABNORMAL LOW (ref 3.5–5.0)
Alkaline Phosphatase: 59 U/L (ref 38–126)
Anion gap: 13 (ref 5–15)
BUN: 14 mg/dL (ref 8–23)
CO2: 23 mmol/L (ref 22–32)
Calcium: 10 mg/dL (ref 8.9–10.3)
Chloride: 106 mmol/L (ref 98–111)
Creatinine, Ser: 1.07 mg/dL — ABNORMAL HIGH (ref 0.44–1.00)
GFR, Estimated: 48 mL/min — ABNORMAL LOW (ref >60)
Glucose, Bld: 117 mg/dL — ABNORMAL HIGH (ref 70–99)
Potassium: 4.1 mmol/L (ref 3.5–5.1)
Sodium: 142 mmol/L (ref 135–145)
Total Bilirubin: 0.7 mg/dL (ref 0.0–1.2)
Total Protein: 7.1 g/dL (ref 6.5–8.1)

## 2024-07-06 LAB — PROCALCITONIN: Procalcitonin: <0.1 ng/mL

## 2024-07-06 LAB — RESP PANEL BY RT-PCR (RSV, FLU A&B, COVID)  RVPGX2
Influenza A by PCR: NEGATIVE
Influenza B by PCR: NEGATIVE
Resp Syncytial Virus by PCR: NEGATIVE
SARS Coronavirus 2 by RT PCR: NEGATIVE

## 2024-07-06 LAB — TSH: TSH: 23.972 u[IU]/mL — ABNORMAL HIGH (ref 0.350–4.500)

## 2024-07-06 LAB — BRAIN NATRIURETIC PEPTIDE: B Natriuretic Peptide: 519.3 pg/mL — ABNORMAL HIGH (ref 0.0–100.0)

## 2024-07-06 MED ORDER — APIXABAN 2.5 MG PO TABS
2.5000 mg | ORAL_TABLET | Freq: Two times a day (BID) | ORAL | Status: DC
  Administered 2024-07-06 – 2024-07-09 (×6): 2.5 mg via ORAL
  Filled 2024-07-06 (×6): qty 1

## 2024-07-06 MED ORDER — ROPINIROLE HCL 0.25 MG PO TABS
0.2500 mg | ORAL_TABLET | Freq: Every day | ORAL | Status: DC
  Administered 2024-07-06 – 2024-07-08 (×3): 0.25 mg via ORAL
  Filled 2024-07-06 (×4): qty 1

## 2024-07-06 MED ORDER — ACETAMINOPHEN 325 MG PO TABS
650.0000 mg | ORAL_TABLET | Freq: Four times a day (QID) | ORAL | Status: DC | PRN
  Administered 2024-07-06: 650 mg via ORAL
  Filled 2024-07-06: qty 2

## 2024-07-06 MED ORDER — LEVOTHYROXINE SODIUM 100 MCG PO TABS
100.0000 ug | ORAL_TABLET | Freq: Every day | ORAL | Status: DC
  Administered 2024-07-07 – 2024-07-09 (×3): 100 ug via ORAL
  Filled 2024-07-06 (×3): qty 1

## 2024-07-06 MED ORDER — FUROSEMIDE 10 MG/ML IJ SOLN
40.0000 mg | Freq: Once | INTRAMUSCULAR | Status: AC
  Administered 2024-07-06: 40 mg via INTRAVENOUS
  Filled 2024-07-06: qty 4

## 2024-07-06 MED ORDER — ALBUTEROL SULFATE (2.5 MG/3ML) 0.083% IN NEBU: 2.5000 mg | INHALATION_SOLUTION | Freq: Four times a day (QID) | RESPIRATORY_TRACT | Status: DC | PRN

## 2024-07-06 MED ORDER — FUROSEMIDE 10 MG/ML IJ SOLN: 40.0000 mg | Freq: Two times a day (BID) | INTRAMUSCULAR | Status: DC

## 2024-07-06 MED ORDER — SODIUM CHLORIDE 0.9% FLUSH
3.0000 mL | Freq: Two times a day (BID) | INTRAVENOUS | Status: DC
  Administered 2024-07-06 – 2024-07-09 (×7): 3 mL via INTRAVENOUS

## 2024-07-06 MED ORDER — ACETAMINOPHEN 650 MG RE SUPP: 650.0000 mg | Freq: Four times a day (QID) | RECTAL | Status: DC | PRN

## 2024-07-06 MED ORDER — METOPROLOL SUCCINATE ER 25 MG PO TB24
12.5000 mg | ORAL_TABLET | Freq: Every day | ORAL | Status: DC
  Administered 2024-07-06: 12.5 mg via ORAL
  Filled 2024-07-06: qty 1

## 2024-07-06 MED ORDER — FUROSEMIDE 10 MG/ML IJ SOLN
20.0000 mg | Freq: Two times a day (BID) | INTRAMUSCULAR | Status: DC
  Administered 2024-07-07 – 2024-07-09 (×3): 20 mg via INTRAVENOUS
  Filled 2024-07-06 (×5): qty 2

## 2024-07-06 MED ORDER — ROSUVASTATIN CALCIUM 5 MG PO TABS
2.5000 mg | ORAL_TABLET | Freq: Every day | ORAL | Status: DC
  Administered 2024-07-07 – 2024-07-09 (×3): 2.5 mg via ORAL
  Filled 2024-07-06 (×4): qty 1

## 2024-07-06 MED ORDER — POLYETHYL GLYCOL-PROPYL GLYCOL 0.4-0.3 % OP GEL: Freq: Two times a day (BID) | OPHTHALMIC | Status: DC | PRN

## 2024-07-06 MED ORDER — DILTIAZEM HCL 25 MG/5ML IV SOLN
10.0000 mg | Freq: Once | INTRAVENOUS | Status: AC
  Administered 2024-07-06: 10 mg via INTRAVENOUS
  Filled 2024-07-06: qty 5

## 2024-07-06 MED ORDER — AMIODARONE HCL 200 MG PO TABS: 200.0000 mg | ORAL_TABLET | Freq: Every day | ORAL | Status: DC

## 2024-07-06 MED ORDER — EZETIMIBE 10 MG PO TABS
10.0000 mg | ORAL_TABLET | Freq: Every day | ORAL | Status: DC
  Administered 2024-07-07 – 2024-07-09 (×3): 10 mg via ORAL
  Filled 2024-07-06 (×3): qty 1

## 2024-07-06 NOTE — Consult Note (Addendum)
 Cardiology Consultation   Patient ID: Amy Fischer MRN: [id number]; DOB: 1929/07/14  Admit date: 07/06/2024 Date of Consult: 07/06/2024  PCP:  Amy [name], MD   [facility] Providers Cardiologist:  Amy Shallow, MD        Patient Profile: Amy Fischer is a 89 y.o. female with a hx of chronic HFpEF, HTN, HLD, PAF, severe MR s/p mitral valve repair 2016 with mild-moderate MR, mild MS, severe TR, nonobstructive CAD, mild carotid artery disease (2016), hypothyroidism, esophageal spasm, arthritis who is being seen 07/06/2024 for the evaluation of Afib and CHF at the request of Dr. Claudene.  History of Present Illness: Amy Fischer was remotely admitted 2016 with heart failure found to have severe MR and preserved EF. Cath showed moderate nonobstructive CAD. She subsequently underwent mitral valve repair. She had atrial fibrillation during surgery. This was later re-demonstrated in 2019 prompting initiation of Eliquis  and amiodarone . Amiodarone  was discontinued in 2022 due to abonrmal CXR (unclear if amio toxicity but stopped with plan to follow). She was recently admitted 6/20-6/24/25 with SOB, treated for AF RVR and acute on chronic HFpEF. She was treated with low dose diuresis, beta blocker therapy, and placed back on oral amiodarone  load (200mg  BID x 7d then 200mg  daily). Of note, both TSH and free T4 were slightly elevated recommended to follow going forward. Echo 06/18/24 showed EF 65-70%, low normal RV function, moderately elevated PASP, mild-moderate MR, mild MS with prior MV annuloplasty ring, severe TR, trivial AI. She required midodrine  initiation for hypotension - this limited her BB therapy. She saw Amy Pouch PA-C in follow-up 06/27/24, noting continued fluctuating symptoms and feeling better on days she would take Lasix . Patient preferred to try increasing diuretic therapy before cnonsidering cardioversion. Lasix  increased to 20mg  daily. Due to a rise in creatinine to 1.29  on 7/3, it was held for 2 days then changed to every other day. Patient reports yesterday was the first day she had gone to every other day, and then she deferred her dose this AM because she planned to have a hair appointment. She otherwise reports adherence in her regimen including no missed doses of Eliquis . She presented back to the hospital with worsening SOB. She has been out of her medicine for RLS so had been walking more than usual. HR 100-120s and BP 200/120 by EMS. Here, CXR showed worsening interstitial opacities in both lungs question atypial infection/viral PNA, possible patchy airspace opacities in the right lung base (atx vs PNA), trace bilateral effusions. BNP 519.3, hsTroponin neg x2, Cr 1.07, pro-cal neg, WBC/Hgb OK, troponins neg x2.  She received 40mg  IV Lasix , 10mg  IV diltiazem . Resp panels pending. Initial SBP 191/, last trended 147/75. Was placed on O2 for comfort, sat reported normal. HR remains low 100s-110s. EKG shows coarse AF RVR vs flutter, telemetry more c/w atrial flutter.  Past Medical History:  Diagnosis Date   Allergy    Anemia    Asymptomatic carotid artery stenosis    R ICA 40% stenosis on CTA 12/2011.   Bladder polyps    Cataract    BILATERAL-REMOVED   Chronic heart failure with preserved ejection fraction (HFpEF) (HCC)    Diverticulosis    Episodic recurrent vertigo    MRI Head 2003   Esophageal spasm    GERD (gastroesophageal reflux disease)    Hepatic hemangioma    History of hepatitis    unknown type   Hyperlipidemia    Hyperplastic colon polyp    Hypothyroidism  Intestinal metaplasia of gastric mucosa    Osteoarthritis    Osteoporosis    Patellar fracture 07/13/2012   Scapular fracture 03/31/2012   small from direct blow with trip     Subclavian steal syndrome    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; CTA 2013 revealed open graft    Past Surgical History:  Procedure Laterality Date   APPENDECTOMY     CARDIAC CATHETERIZATION N/A  10/04/2015   Procedure: Right/Left Heart Cath and Coronary Angiography;  Surgeon: Amy LELON Sharps, MD;  Location: [facility] INVASIVE CV LAB;  Service: Cardiovascular;  Laterality: N/A;   CAROTID-SUBCLAVIAN BYPASS GRAFT Left 1999   for [place] steal syndrome   CHOLECYSTECTOMY     COLONOSCOPY     CYSTECTOMY Left    hand   ELBOW SURGERY Left    KNEE SURGERY Left 2014   MITRAL VALVE REPAIR N/A 10/10/2015   Procedure: MITRAL VALVE REPAIR (MVR) WITH SIZE 28 CARPENTIER-EDWARDS PHYSIO II ANNULOPLASTY RING;  Surgeon: Amy JAYSON Millers, MD;  Location: [facility] OR;  Service: Open Heart Surgery;  Laterality: N/A;   ROTATOR CUFF REPAIR Left    TEAR DUCT PROBING  07/2014   TEE WITHOUT CARDIOVERSION N/A 10/03/2015   Procedure: TRANSESOPHAGEAL ECHOCARDIOGRAM (TEE);  Surgeon: Amy GORMAN Shuck, MD;  Location: [facility] ENDOSCOPY;  Service: Cardiovascular;  Laterality: N/A;   TEE WITHOUT CARDIOVERSION N/A 10/10/2015   Procedure: TRANSESOPHAGEAL ECHOCARDIOGRAM (TEE);  Surgeon: Amy JAYSON Millers, MD;  Location: [facility] OR;  Service: Open Heart Surgery;  Laterality: N/A;   TONSILLECTOMY     TUBAL LIGATION       Home Medications:  Prior to Admission medications   Medication Sig Start Date End Date Taking? Authorizing Provider  amiodarone  (PACERONE ) 200 MG tablet Take one tablet daily 06/27/24  Yes Williams, Evan, PA-C  apixaban  (ELIQUIS ) 2.5 MG TABS tablet Take 1 tablet (2.5 mg total) by mouth 2 (two) times daily. 06/27/24  Yes Williams, Evan, PA-C  Cholecalciferol  (VITAMIN D -3 PO) Take 1 capsule by mouth daily.   Yes [provider]  Cyanocobalamin  (VITAMIN B-12 PO) Take 1 tablet by mouth daily.   Yes [provider]  ezetimibe  (ZETIA ) 10 MG tablet TAKE 1 TABLET DAILY 08/04/23  Yes Fischer, Amy K, MD  furosemide  (LASIX ) 20 MG tablet Take 1 tablet (20 mg total) by mouth every other day. You may take a dose on your off days as needed for swelling or weight gain of 3lbs in 24hrs or 5lbs in a week. 07/04/24  Yes Williams, Evan,  PA-C  ipratropium (ATROVENT ) 0.06 % nasal spray USE 1 SPRAY IN EACH NOSTRIL TWICE A DAY AS NEEDED FOR RHINITIS Patient taking differently: Place 1 spray into the nose at bedtime. 04/27/23  Yes Webb, Padonda B, FNP  levothyroxine  (SYNTHROID ) 100 MCG tablet TAKE 1 TABLET EVERY MORNING ON AN EMPTY STOMACH 10/14/23  Yes Webb, Padonda B, FNP  metoprolol  succinate (TOPROL -XL) 25 MG 24 hr tablet Take 1/2 tablet (12.5 mg total) by mouth at bedtime. 06/21/24 08/20/24 Yes Shahmehdi, Adriana LABOR, MD  midodrine  (PROAMATINE ) 5 MG tablet Take 1 tablet (5 mg total) by mouth 3 (three) times daily with meals. 06/27/24 07/27/24 Yes Williams, Evan, PA-C  Polyethyl Glycol-Propyl Glycol (SYSTANE OP) Place 1 drop into both eyes 2 (two) times daily as needed (dry eye).   Yes [provider]  rOPINIRole  (REQUIP ) 0.25 MG tablet Take 1 tablet (0.25 mg total) by mouth at bedtime. For restless leg 07/05/24 08/04/24 Yes Fischer, Amy K, MD  rosuvastatin  (CRESTOR ) 5 MG tablet Take 0.5 tablets (2.5 mg total) by mouth daily. 08/03/23  Yes Fischer, Amy K, MD  SALINE NASAL SPRAY NA Place 1 spray into both nostrils 2 (two) times daily as needed (sinus irritation, dryness).   Yes [provider]    Scheduled Meds:  [START ON 07/07/2024] amiodarone   200 mg Oral Daily   apixaban   2.5 mg Oral BID   [START ON 07/07/2024] ezetimibe   10 mg Oral Daily   [START ON 07/07/2024] furosemide   20 mg Intravenous BID   [START ON 07/07/2024] levothyroxine   100 mcg Oral Q0600   metoprolol  succinate  12.5 mg Oral QHS   rOPINIRole   0.25 mg Oral QHS   [START ON 07/07/2024] rosuvastatin   2.5 mg Oral Daily   sodium chloride  flush  3 mL Intravenous Q12H   Continuous Infusions:  PRN Meds: acetaminophen  **OR** acetaminophen , albuterol , Polyethyl Glycol-Propyl Glycol  Allergies:    Allergies  Allergen Reactions   Actonel [Risedronate Sodium] Other (See Comments)    Upset stomach  OK to take Fosamax    Codeine Nausea And Vomiting   Statins Other  (See Comments)    Myalgias   OK to take Crestor  2.5mg  QD.    Augmentin  [Amoxicillin -Pot Clavulanate] Diarrhea   Tape Other (See Comments)    Skin blisters  OK to use paper tape    Social History:   Social History   Socioeconomic History   Marital status: Widowed    Spouse name: Not on file   Number of children: 4   Years of education: Not on file   Highest education level: Bachelor's degree (e.g., BA, AB, BS)  Occupational History   Occupation: retired Runner, broadcasting/film/video  Tobacco Use   Smoking status: Former    Current packs/day: 0.00    Average packs/day: 0.8 packs/day for 20.0 years (15.0 ttl pk-yrs)    Types: Cigarettes    Start date: 01/05/1971    Quit date: 01/05/1991    Years since quitting: 33.5   Smokeless tobacco: Never  Vaping Use   Vaping status: Never Used  Substance and Sexual Activity   Alcohol  use: Not Currently    Alcohol /week: 4.0 standard drinks of alcohol     Types: 4 Glasses of wine per week    Comment: socially   Drug use: No   Sexual activity: Not on file  Other Topics Concern   Not on file  Social History Narrative   Widowed.  Lives alone in a one story home.  Has 4 children (3 living).  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    HH of 1    No pets   Former smoker   Exercises regularly   Right handed   Social Drivers of Corporate investment banker Strain: Low Risk  (07/04/2024)   Overall Financial Resource Strain (CARDIA)    Difficulty of Paying Living Expenses: Not hard at all  Food Insecurity: No Food Insecurity (07/04/2024)   Hunger Vital Sign    Worried About Running Out of Food in the Last Year: Never true    Ran Out of Food in the Last Year: Never true  Transportation Needs: No Transportation Needs (07/04/2024)   PRAPARE - Administrator, Civil Service (Medical): No    Lack of Transportation (Non-Medical): No  Physical Activity: Insufficiently Active (07/04/2024)   Exercise Vital Sign    Days of Exercise per Week: 2 days    Minutes of Exercise  per Session: 50 min  Stress: No Stress Concern Present (  07/04/2024)   [place] [employer] - Occupational Stress Questionnaire    Feeling of Stress: Only a little  Social Connections: Moderately Integrated (07/04/2024)   Social Connection and Isolation Panel    Frequency of Communication with Friends and Family: More than three times a week    Frequency of Social Gatherings with Friends and Family: Three times a week    Attends Religious Services: More than 4 times per year    Active Member of Clubs or Organizations: Yes    Attends Banker Meetings: More than 4 times per year    Marital Status: Widowed  Intimate Partner Violence: Not At Risk (06/22/2024)   Humiliation, Afraid, Rape, and Kick questionnaire    Fear of Current or Ex-Partner: No    Emotionally Abused: No    Physically Abused: No    Sexually Abused: No    Family History:    Family History  Problem Relation Age of Onset   Hypertension Mother    Stroke Mother    Heart disease Father    Colon cancer Neg Hx    Neurofibromatosis Neg Hx    Allergic rhinitis Neg Hx    Asthma Neg Hx    Angioedema Neg Hx    Atopy Neg Hx    Eczema Neg Hx    Immunodeficiency Neg Hx    Urticaria Neg Hx      ROS:  Please see the history of present illness.  All other ROS reviewed and negative.     Physical Exam/Data: Vitals:   07/06/24 1218 07/06/24 1344 07/06/24 1345 07/06/24 1706  BP: (!) 161/78  (!) 149/97 (!) 147/75  Pulse: 97  (!) 101 (!) 101  Resp: (!) 25  (!) 27   Temp:  97.9 F (36.6 C)  97.8 F (36.6 C)  TempSrc:  Oral    SpO2: 100%  99% 98%   No intake or output data in the 24 hours ending 07/06/24 1823    07/05/2024    3:48 PM 06/27/2024    2:43 PM 06/21/2024    6:31 AM  Last 3 Weights  Weight (lbs) 100 lb 9.6 oz 103 lb 98 lb 11.2 oz  Weight (kg) 45.632 kg 46.72 kg 44.77 kg     There is no height or weight on file to calculate BMI.  General: Thin elderly WF in no acute distress. Head:  Normocephalic, atraumatic, sclera non-icteric, no xanthomas, nares are without discharge. Neck: Negative for carotid bruits. JVP not elevated. Lungs: Decreased BS L base, coarse R base without overt wheezes, rales, or rhonchi. Breathing is unlabored. Heart: Tachycardic, irregular S1 S2 without murmurs, rubs, or gallops.  Abdomen: Soft, non-tender, non-distended with normoactive bowel sounds. No rebound/guarding. Extremities: No clubbing or cyanosis. No edema. Distal pedal pulses are 2+ and equal bilaterally. Neuro: Alert and oriented X 3. Moves all extremities spontaneously. Psych:  Responds to questions appropriately with a normal affect.   EKG:  The EKG was personally reviewed and demonstrates:  coarse atrial fib vs flutter 106bpm nonspecific STTW changes Telemetry:  Telemetry was personally reviewed and demonstrates:  AFL  Relevant CV Studies: 2d echo 05/2024    1. Left ventricular ejection fraction, by estimation, is 65 to 70%. The  left ventricle has normal function. The left ventricle has no regional  wall motion abnormalities. Left ventricular diastolic parameters are  indeterminate.   2. Right ventricular systolic function is low normal. The right  ventricular size is mildly enlarged. There is moderately elevated  pulmonary artery systolic pressure. The estimated right ventricular  systolic pressure is 48.6 mmHg.   3. Left atrial size was mildly dilated.   4. Right atrial size was mildly dilated.   5. The mitral valve has been repaired/replaced. Mild to moderate mitral  valve regurgitation. Mild mitral stenosis. The mean mitral valve gradient  is 5.5 mmHg. There is a 28 mm Carpentier prosthetic annuloplasty ring  present in the mitral position.  Procedure Date: 10/10/2015.   6. Tricuspid valve regurgitation is severe.   7. The aortic valve is tricuspid. There is moderate calcification of the  aortic valve. Aortic valve regurgitation is trivial. No aortic stenosis is   present.   8. The inferior vena cava is normal in size with greater than 50%  respiratory variability, suggesting right atrial pressure of 3 mmHg.   Laboratory Data: High Sensitivity Troponin:   Recent Labs  Lab 06/19/24 0758 06/19/24 0820 07/06/24 1051 07/06/24 1345  TROPONINIHS 28* 33* 10 12     Chemistry Recent Labs  Lab 06/30/24 1144 07/06/24 1051  NA 140 142  K 4.7 4.1  CL 103 106  CO2 25 23  GLUCOSE 77 117*  BUN 22 14  CREATININE 1.29* 1.07*  CALCIUM  9.8 10.0  GFRNONAA  --  48*  ANIONGAP  --  13    Recent Labs  Lab 07/06/24 1051  PROT 7.1  ALBUMIN  3.4*  AST 24  ALT 15  ALKPHOS 59  BILITOT 0.7   Lipids No results for input(s): CHOL, TRIG, HDL, LABVLDL, LDLCALC, CHOLHDL in the last 168 hours.  Hematology Recent Labs  Lab 07/06/24 1051  WBC 7.5  RBC 4.03  HGB 13.3  HCT 42.4  MCV 105.2*  MCH 33.0  MCHC 31.4  RDW 14.6  PLT 169   Thyroid  No results for input(s): TSH, FREET4 in the last 168 hours.  BNP Recent Labs  Lab 07/06/24 1051  BNP 519.3*    DDimer No results for input(s): DDIMER in the last 168 hours.  Radiology/Studies:  DG Chest Port 1 View Result Date: 07/06/2024 CLINICAL DATA:  sob EXAM: PORTABLE CHEST - 1 VIEW COMPARISON:  06/17/24 FINDINGS: Coarsening of the pulmonary interstitium with interval worsening. Patchy airspace opacities in the right lung base. Trace bilateral pleural effusions. No pneumothorax. Moderate cardiomegaly. Mitral annuloplasty. Sternotomy wires. Tortuous aorta with aortic atherosclerosis. No acute fracture or destructive lesions. Multilevel thoracic osteophytosis. Osteopenia. Surgical clips in the left supraclavicular fossa. IMPRESSION: 1. Worsening interstitial opacities throughout both lungs, which may reflect atypical infection or viral pneumonia. Patchy airspace opacities in the right lung base, which may reflect atelectasis or a superimposed bronchopneumonia, in the correct clinical context. 2.  Trace bilateral pleural effusions. Electronically Signed   By: Rogelia Myers M.D.   On: 07/06/2024 11:32     Assessment and Plan:  1. Acute on chronic HFpEF suspect driven by persistent atrial fib/flutter with RVR, with concomitant CKD 3a - remains in atrial flutter with mild tachycardia - has been adherent with Eliquis  2.5mg  BID without missed doses, on amiodarone  200mg  daily after completing 7 days of BID after last admission  - received 40mg  IV Lasix  and 10mg  IV diltiazem  today, feels marginally better - written for Lasix  20mg  IV BID starting in AM, Toprol  12.5mg  QPM, and amiodarone  200mg  daily - may need to run Cr slightly higher to achieve/maintain euvolemia going forward, follow trajectory  2. Accelerated HTN - suspect driven by respiratory distress on presentation, hold midodrine  and follow  3. Abnormal CXR -  Possible Viral PNA - resp panels collected and sent - if unrevealing, consider underlying interstitial lung disease as she had prior abnormal imaging showing subpleural reticulation and scarring bilaterally -> the presence of such can make SOB/atrial arrhythmias more challenging to treat  4. Nonobstructive CAD - denies chest pain, negative troponins  5. H/o MV repair with mild-moderate MR, mild MS, severe TR by echo - manage in context above  6. Hypothyroidism - repeat TFTs pending   Risk Assessment/Risk Scores:       [place]  Heart Association (NYHA) Functional Class NYHA Class IV  CHA2DS2-VASc Score = 6   This indicates a 9.7% annual risk of stroke. The patient's score is based upon: CHF History: 1 HTN History: 1 Diabetes History: 0 Stroke History: 0 Vascular Disease History: 1 Age Score: 2 Gender Score: 1     For questions or updates, please contact [facility] Please consult [email or web address] for contact info under    Signed, Raphael LOISE Bring, PA-C  07/06/2024 6:23 PM  History and all data above reviewed.  I personally took the history today,  performed the physical exam and formulated the assessment and plan.  I reviewed all relevant tests and studies. Patient examined.  I agree with the findings as above.  Looks like she has been in atrial fibrillation since May.  She was seen in consultation by Dr. Jeffrie.  She wanted to hold off on cardioversion.  She said that her dyspnea has been somewhat paroxysmal.  She thought she felt pretty good yesterday but she woke up this morning and she was short of breath and sought help.  She does not feel her fibrillation.  She has not had any presyncope or syncope.  She denies any chest pain.  She had a little ankle edema.  The patient exam reveals COR: Irregular no murmurs,  Lungs: Decreased breath sounds at the bases with fine crackles,  Abd: Positive bowel sounds normal frequency pitch, bruits, rebound, guarding, Ext 2+ pulses, no edema.  All available labs, radiology testing, previous records reviewed. Agree with documented assessment and plan.  Acute on chronic diastolic heart failure at this point I think continuing the IV Lasix  is reasonable.  Given the fact that she had a couple of presentations with atrial fibrillation I think this admission we might need consider cardioversion though she is reluctant.  She can continue her beta-blocker.  Continue her amiodarone .  Mitral valve repair: She had mild to moderate regurgitation on echo in June.  There is a mild mean mitral valve gradient.  No further imaging is indicated.  She has a well-preserved ejection fraction.  Amy Fischer  7:01 PM  07/06/2024

## 2024-07-06 NOTE — ED Triage Notes (Signed)
 PT BIB EMS from home for shortness of breath since 0600 this morning, out of restless leg syndrome medication so she walked more than usual and exacerbated her COPD.  HX of CHF.  HR 100-120 BP 200/120 Cap 25 12 lead unremarkable other than rate 20 g Left AC

## 2024-07-06 NOTE — ED Notes (Signed)
 Radiology at bedside

## 2024-07-06 NOTE — ED Provider Notes (Signed)
 [facility]MENT AT [facility] Provider Note   CSN: [id number] Arrival date & time: 07/06/24  1033     Patient presents with: Shortness of Breath   Amy Fischer is a 89 y.o. female.   Pt is a 89y/o female with hx of diastolic dysfunction CHF, GERD, hyperlipidemia, osteoarthritis, osteoporosis, carotid artery stenosis, hypothyroidism, essential hypertension and esophageal spasm with recent hospitalization for a.fib rvr/chf and SOB with possible COPD but not on O2 or any inhalers at home who presents today due to worsening SOB.  Patient reports yesterday seem to be a pretty normal day.  She states she almost felt normal.  She did follow-up with her doctor yesterday for a hospital follow-up and seemed to have been doing pretty well but reports that overnight she started feeling short of breath and it just gradually worsened.  She did take all of her home medications this morning except for her Lasix  because she was planning on getting her hair done and she wanted to wait till she got home.  However the shortness of breath just got worse.  She reports feeling a little better after oxygen being placed but she still feels short of breath.  She denies any chest pain.  Reports the swelling in her legs has been gradually improving.  She has not had a productive cough or a fever.  She denies any abdominal pain nausea or vomiting.  Reports this feels similar to the last time she had come to the hospital.  EMS applied oxygen but sats were normal.  They did not give any medication and route.  The history is provided by the patient.  Shortness of Breath      Prior to Admission medications   Medication Sig Start Date End Date Taking? Authorizing Provider  albuterol  (VENTOLIN  HFA) 108 (90 Base) MCG/ACT inhaler Inhale 2 puffs into the lungs every 6 (six) hours as needed for wheezing or shortness of breath. 12/24/21   Kozlow, Camellia PARAS, MD  amiodarone  (PACERONE ) 200 MG tablet Take one  tablet daily 06/27/24   Williams, Evan, PA-C  apixaban  (ELIQUIS ) 2.5 MG TABS tablet Take 1 tablet (2.5 mg total) by mouth 2 (two) times daily. 06/27/24   Williams, Evan, PA-C  cholecalciferol  (VITAMIN D ) 1000 units tablet Take 1,000 Units by mouth daily.    [provider]  ezetimibe  (ZETIA ) 10 MG tablet TAKE 1 TABLET DAILY 08/04/23   Panosh, Wanda K, MD  furosemide  (LASIX ) 20 MG tablet Take 1 tablet (20 mg total) by mouth every other day. You may take a dose on your off days as needed for swelling or weight gain of 3lbs in 24hrs or 5lbs in a week. 07/04/24   Williams, Evan, PA-C  ipratropium (ATROVENT ) 0.06 % nasal spray USE 1 SPRAY IN EACH NOSTRIL TWICE A DAY AS NEEDED FOR RHINITIS 04/27/23   Webb, Padonda B, FNP  levothyroxine  (SYNTHROID ) 100 MCG tablet TAKE 1 TABLET EVERY MORNING ON AN EMPTY STOMACH 10/14/23   Webb, Padonda B, FNP  melatonin 5 MG TABS Take 5 mg by mouth at bedtime.    [provider]  metoprolol  succinate (TOPROL -XL) 25 MG 24 hr tablet Take 1/2 tablet (12.5 mg total) by mouth at bedtime. 06/21/24 08/20/24  Willette Adriana LABOR, MD  midodrine  (PROAMATINE ) 5 MG tablet Take 1 tablet (5 mg total) by mouth 3 (three) times daily with meals. 06/27/24 07/27/24  Williams, Evan, PA-C  Polyethyl Glycol-Propyl Glycol 0.4-0.3 % SOLN Place 1 drop into both eyes  2 (two) times daily.    [provider]  rOPINIRole  (REQUIP ) 0.25 MG tablet Take 1 tablet (0.25 mg total) by mouth at bedtime. For restless leg 07/05/24 08/04/24  Panosh, [name], MD  rosuvastatin  (CRESTOR ) 5 MG tablet Take 0.5 tablets (2.5 mg total) by mouth daily. 08/03/23   Panosh, Wanda K, MD  SALINE NASAL SPRAY NA Place 1 spray into both nostrils in the morning and at bedtime.    [provider]  vitamin B-12 (CYANOCOBALAMIN ) 1000 MCG tablet Take 1,000 mcg by mouth daily.    [provider]    Allergies: Codeine, Risedronate sodium, Statins, Tape, and Amoxicillin -pot clavulanate    Review of Systems   Respiratory:  Positive for shortness of breath.     Updated Vital Signs BP (!) 149/97   Pulse (!) 101   Temp 97.9 F (36.6 C) (Oral)   Resp (!) 27   SpO2 99%   Physical Exam Vitals and nursing note reviewed.  Constitutional:      General: She is not in acute distress.    Appearance: She is well-developed.  HENT:     Head: Normocephalic and atraumatic.  Eyes:     Pupils: Pupils are equal, round, and reactive to light.  Cardiovascular:     Rate and Rhythm: Tachycardia present. Rhythm irregularly irregular.     Heart sounds: Normal heart sounds. No murmur heard.    No friction rub.  Pulmonary:     Effort: Pulmonary effort is normal. Tachypnea present.     Breath sounds: Normal breath sounds. No wheezing or rales.  Abdominal:     General: Bowel sounds are normal. There is no distension.     Palpations: Abdomen is soft.     Tenderness: There is no abdominal tenderness. There is no guarding or rebound.  Musculoskeletal:        General: No tenderness. Normal range of motion.     Right lower leg: Edema present.     Left lower leg: Edema present.     Comments: Trace edema bilateral ankles, 1+ edema in the feet  Skin:    General: Skin is warm and dry.     Findings: No rash.  Neurological:     General: No focal deficit present.     Mental Status: She is alert and oriented to person, place, and time.     Cranial Nerves: No cranial nerve deficit.  Psychiatric:        Behavior: Behavior normal.     (all labs ordered are listed, but only abnormal results are displayed) Labs Reviewed  CBC WITH DIFFERENTIAL/PLATELET - Abnormal; Notable for the following components:      Result Value   MCV 105.2 (*)    Abs Immature Granulocytes 0.12 (*)    All other components within normal limits  COMPREHENSIVE METABOLIC PANEL WITH GFR - Abnormal; Notable for the following components:   Glucose, Bld 117 (*)    Creatinine, Ser 1.07 (*)    Albumin  3.4 (*)    GFR, Estimated 48 (*)    All  other components within normal limits  BRAIN NATRIURETIC PEPTIDE - Abnormal; Notable for the following components:   B Natriuretic Peptide 519.3 (*)    All other components within normal limits  TROPONIN I (HIGH SENSITIVITY)  TROPONIN I (HIGH SENSITIVITY)    EKG: EKG Interpretation Date/Time:  Wednesday July 06 2024 10:50:25 EDT Ventricular Rate:  106 PR Interval:    QRS Duration:  87 QT Interval:  415  QTC Calculation: 552 R Axis:   25  Text Interpretation: Atrial flutter with predominant 2:1 AV block Ventricular premature complex Borderline repolarization abnormality Prolonged QT interval No significant change since last tracing Confirmed by Doretha Folks ([id number]) on 07/06/2024 11:02:13 AM  Radiology: [facility] Chest Port 1 View Result Date: 07/06/2024 CLINICAL DATA:  sob EXAM: PORTABLE CHEST - 1 VIEW COMPARISON:  06/17/24 FINDINGS: Coarsening of the pulmonary interstitium with interval worsening. Patchy airspace opacities in the right lung base. Trace bilateral pleural effusions. No pneumothorax. Moderate cardiomegaly. Mitral annuloplasty. Sternotomy wires. Tortuous aorta with aortic atherosclerosis. No acute fracture or destructive lesions. Multilevel thoracic osteophytosis. Osteopenia. Surgical clips in the left supraclavicular fossa. IMPRESSION: 1. Worsening interstitial opacities throughout both lungs, which may reflect atypical infection or viral pneumonia. Patchy airspace opacities in the right lung base, which may reflect atelectasis or a superimposed bronchopneumonia, in the correct clinical context. 2. Trace bilateral pleural effusions. Electronically Signed   By: Rogelia Myers M.D.   On: 07/06/2024 11:32     Procedures   Medications Ordered in the ED  furosemide  (LASIX ) injection 40 mg (has no administration in time range)  diltiazem  (CARDIZEM ) injection 10 mg (10 mg Intravenous Given 07/06/24 1138)                                    Medical Decision Making Amount and/or  Complexity of Data Reviewed Independent Historian: EMS External Data Reviewed: notes. Labs: ordered. Decision-making details documented in ED Course. Radiology: ordered and independent interpretation performed. Decision-making details documented in ED Course. ECG/medicine tests: ordered and independent interpretation performed. Decision-making details documented in ED Course.  Risk Prescription drug management. Decision regarding hospitalization.   Pt with multiple medical problems and comorbidities and presenting today with a complaint that caries a high risk for morbidity and mortality.  Here today with shortness of breath.  Patient is found to be hypertensive, tachycardic and appears winded on exam.  She has no rales or wheezing.  Low suspicion for COPD exacerbation as patient does not use oxygen at home and does not use inhalers regularly.  Concern for recurrent CHF, hypertensive urgency with A-fib RVR.  Lower suspicion for PE as patient is chronically anticoagulated and compliant with her medications.  Also lower suspicion for pneumonia.  I independently interpreted patient's EKG and it does show atrial fibrillation with a rate in the low 100s.  On exam heart rate is anywhere from 105-120.  Sats are normal but patient remains tachypneic.  Will give a dose of IV Cardizem .  Patient did report taking all of her morning medications today other than Lasix .  That does include amiodarone .  Patient is denying any chest pain at this time and lower suspicion for MI.  EKG does not show any significant ST changes.  I independently interpreted patient's labs and CBC, CMP without acute findings, troponin is normal at 10, BNP is 519 today which is elevated from her prior 1 at 300.  I have independently visualized and interpreted pt's images today.  Chest x-ray today concerning for pulmonary edema.  Radiology reports possible atypical infection however patient is having no cough, fever or infectious symptoms.   Feel more likely this is related to fluid.  After Cardizem  patient's heart rate is improved work of breathing is improved but she still feels mildly short of breath.  She was given IV Lasix .  Discussed these results with the patient and her  family.  Will admit for further care. CRITICAL CARE Performed by: Tahirah Sara Total critical care time: 30 minutes Critical care time was exclusive of separately billable procedures and treating other patients. Critical care was necessary to treat or prevent imminent or life-threatening deterioration. Critical care was time spent personally by me on the following activities: development of treatment plan with patient and/or surrogate as well as nursing, discussions with consultants, evaluation of patient's response to treatment, examination of patient, obtaining history from patient or surrogate, ordering and performing treatments and interventions, ordering and review of laboratory studies, ordering and review of radiographic studies, pulse oximetry and re-evaluation of patient's condition.        Final diagnoses:  Acute on chronic congestive heart failure, unspecified heart failure type [facility])  Atrial fibrillation with RVR [facility])  Hypertensive urgency    ED Discharge Orders     None          Doretha Folks, MD 07/06/24 1418

## 2024-07-06 NOTE — H&P (Signed)
 History and Physical    Patient: Amy Fischer FMW:[id number] DOB: April 07, 1929 DOA: 07/06/2024 DOS: the patient was seen and examined on 07/06/2024 PCP: Charlett [name], MD  Patient coming from: Home  Chief Complaint:  Chief Complaint  Patient presents with   Shortness of Breath   HPI: Amy Fischer is a 89 y.o. female with medical history significant of hypertension, hyperlipidemia, diastolic CHF, atrial fibrillation, carotid artery stenosis, hypothyroidism, esophageal spasm, arthritis who presents with difficulty breathing. She is accompanied by her son.  She has been experiencing difficulty breathing that began this morning.  Records note patient had just recently been hospitalized 620 -6/24 for acute hypoxic respiratory failure secondary to heart failure with preserved ejection fraction.  She reports symptoms are similar, but during this episode, she could hardly talk due to the severity of her symptoms. She has not been on oxygen since her last hospital discharge.  She has a history of atrial fibrillation but does not experience palpitations or chest pain. She is currently taking Eliquis  without missing any doses. Her cardiologist is Dr. Pietro.  She experiences leg swelling and was prescribed four new medications that she has been taking as prescribed.  After her last hospitalization, including one for restless legs. She monitors her weight daily, noting only a one to two-pound fluctuation since her last hospitalization. No significant weight changes.  She has not had any significant fever, cough, abdominal pain, nausea, vomiting, or diarrhea.  In the emergency department patient was noted to be afebrile with heart rates elevated into the 120s in atrial fibrillation, respirations 24-27, blood pressures elevated up to 191/91, and O2 saturations maintained on room air.  Patient was placed on 3 L of nasal cannula oxygen due to work of breathing.  Labs significant for BNP 519.3 and  high-sensitivity troponins negative x 2.  Chest x-ray noted worsening interstitial opacities throughout both lungs  and trace bilateral pleural effusions.  Patient had been given Lasix  40 mg IV x 1 dose and Cardizem  10 mg IV.  TRH called to admit.  Review of Systems: As mentioned in the history of present illness. All other systems reviewed and are negative. Past Medical History:  Diagnosis Date   Allergy    Anemia    Asymptomatic carotid artery stenosis    R ICA 40% stenosis on CTA 12/2011.   Bladder polyps    Cataract    BILATERAL-REMOVED   CHF (congestive heart failure) (HCC)    Diverticulosis    Elevated blood pressure 03/25/2011   Bp readings borderline today has hx of elvation  In office and ok at home   Not checke recently   She gfeels was elevated from anxiety     Episodic recurrent vertigo    MRI Head 2003   Esophageal spasm    GERD (gastroesophageal reflux disease)    Hepatic hemangioma    History of hepatitis    unknown type   Hyperlipidemia    Hyperplastic colon polyp    Hypothyroidism    Intestinal metaplasia of gastric mucosa    Osteoarthritis    Osteoporosis    Patellar fracture 07/13/2012   Scapular fracture 03/31/2012   small from direct blow with trip     Subclavian steal syndrome    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; CTA 2013 revealed open graft   Past Surgical History:  Procedure Laterality Date   APPENDECTOMY     CARDIAC CATHETERIZATION N/A 10/04/2015   Procedure: Right/Left Heart Cath and Coronary  Angiography;  Surgeon: Victory LELON Sharps, MD;  Location: [facility] INVASIVE CV LAB;  Service: Cardiovascular;  Laterality: N/A;   CAROTID-SUBCLAVIAN BYPASS GRAFT Left 1999   for [place] steal syndrome   CHOLECYSTECTOMY     COLONOSCOPY     CYSTECTOMY Left    hand   ELBOW SURGERY Left    KNEE SURGERY Left 2014   MITRAL VALVE REPAIR N/A 10/10/2015   Procedure: MITRAL VALVE REPAIR (MVR) WITH SIZE 28 CARPENTIER-EDWARDS PHYSIO II ANNULOPLASTY RING;  Surgeon: Elspeth JAYSON Millers, MD;  Location: [facility] OR;  Service: Open Heart Surgery;  Laterality: N/A;   ROTATOR CUFF REPAIR Left    TEAR DUCT PROBING  07/2014   TEE WITHOUT CARDIOVERSION N/A 10/03/2015   Procedure: TRANSESOPHAGEAL ECHOCARDIOGRAM (TEE);  Surgeon: Ezra GORMAN Shuck, MD;  Location: [facility] ENDOSCOPY;  Service: Cardiovascular;  Laterality: N/A;   TEE WITHOUT CARDIOVERSION N/A 10/10/2015   Procedure: TRANSESOPHAGEAL ECHOCARDIOGRAM (TEE);  Surgeon: Elspeth JAYSON Millers, MD;  Location: [facility] OR;  Service: Open Heart Surgery;  Laterality: N/A;   TONSILLECTOMY     TUBAL LIGATION     Social History:  reports that she quit smoking about 33 years ago. Her smoking use included cigarettes. She started smoking about 53 years ago. She has a 15 pack-year smoking history. She has never used smokeless tobacco. She reports that she does not currently use alcohol  after a past usage of about 4.0 standard drinks of alcohol  per week. She reports that she does not use drugs.  Allergies  Allergen Reactions   Codeine Nausea And Vomiting   Risedronate Sodium     Upset stomach. Could take Fosamax .   Statins     Muscles hurt, can take low dose    Tape Other (See Comments)    Blisters, Please use paper tape   Amoxicillin -Pot Clavulanate Diarrhea    Not allergic  2014, Pt can take z pack only     Family History  Problem Relation Age of Onset   Hypertension Mother    Stroke Mother    Heart disease Father    Colon cancer Neg Hx    Neurofibromatosis Neg Hx    Allergic rhinitis Neg Hx    Asthma Neg Hx    Angioedema Neg Hx    Atopy Neg Hx    Eczema Neg Hx    Immunodeficiency Neg Hx    Urticaria Neg Hx     Prior to Admission medications   Medication Sig Start Date End Date Taking? Authorizing Provider  albuterol  (VENTOLIN  HFA) 108 (90 Base) MCG/ACT inhaler Inhale 2 puffs into the lungs every 6 (six) hours as needed for wheezing or shortness of breath. 12/24/21   Kozlow, Camellia PARAS, MD  amiodarone  (PACERONE ) 200 MG tablet  Take one tablet daily 06/27/24   Williams, Evan, PA-C  apixaban  (ELIQUIS ) 2.5 MG TABS tablet Take 1 tablet (2.5 mg total) by mouth 2 (two) times daily. 06/27/24   Williams, Evan, PA-C  cholecalciferol  (VITAMIN D ) 1000 units tablet Take 1,000 Units by mouth daily.    [provider]  ezetimibe  (ZETIA ) 10 MG tablet TAKE 1 TABLET DAILY 08/04/23   Panosh, Wanda K, MD  furosemide  (LASIX ) 20 MG tablet Take 1 tablet (20 mg total) by mouth every other day. You may take a dose on your off days as needed for swelling or weight gain of 3lbs in 24hrs or 5lbs in a week. 07/04/24   Williams, Evan, PA-C  ipratropium (ATROVENT ) 0.06 % nasal spray USE 1 SPRAY IN  EACH NOSTRIL TWICE A DAY AS NEEDED FOR RHINITIS 04/27/23   Webb, Padonda B, FNP  levothyroxine  (SYNTHROID ) 100 MCG tablet TAKE 1 TABLET EVERY MORNING ON AN EMPTY STOMACH 10/14/23   Webb, Padonda B, FNP  melatonin 5 MG TABS Take 5 mg by mouth at bedtime.    [provider]  metoprolol  succinate (TOPROL -XL) 25 MG 24 hr tablet Take 1/2 tablet (12.5 mg total) by mouth at bedtime. 06/21/24 08/20/24  Willette Adriana LABOR, MD  midodrine  (PROAMATINE ) 5 MG tablet Take 1 tablet (5 mg total) by mouth 3 (three) times daily with meals. 06/27/24 07/27/24  Williams, Evan, PA-C  Polyethyl Glycol-Propyl Glycol 0.4-0.3 % SOLN Place 1 drop into both eyes 2 (two) times daily.    [provider]  rOPINIRole  (REQUIP ) 0.25 MG tablet Take 1 tablet (0.25 mg total) by mouth at bedtime. For restless leg 07/05/24 08/04/24  Panosh, Wanda K, MD  rosuvastatin  (CRESTOR ) 5 MG tablet Take 0.5 tablets (2.5 mg total) by mouth daily. 08/03/23   Panosh, Wanda K, MD  SALINE NASAL SPRAY NA Place 1 spray into both nostrils in the morning and at bedtime.    [provider]  vitamin B-12 (CYANOCOBALAMIN ) 1000 MCG tablet Take 1,000 mcg by mouth daily.    [provider]    Physical Exam: Vitals:   07/06/24 1217 07/06/24 1218 07/06/24 1344 07/06/24 1345  BP:  (!) 161/78   (!) 149/97  Pulse: (!) 116 97  (!) 101  Resp: (!) 26 (!) 25  (!) 27  Temp:   97.9 F (36.6 C)   TempSrc:   Oral   SpO2: 100% 100%  99%   Constitutional: Elderly female who appears to be in some respiratory distress Eyes: PERRL, lids and conjunctivae normal ENMT: Mucous membranes are moist.  Normal dentition.  Neck: normal, supple  Respiratory: Tachypneic with expiratory crackles appreciated.  O2 saturations currently maintained on 3 L of oxygen.  Patient able to talk in shortened sentences. Cardiovascular: Irregularly irregular.  No significant lower extremity swelling appreciated at this time. Abdomen: no tenderness, no masses palpated. . Bowel sounds positive.  Musculoskeletal: no clubbing / cyanosis. No joint deformity upper and lower extremities. Good ROM, no contractures. Normal muscle tone.  Skin: no rashes, lesions, ulcers. No induration Neurologic: CN 2-12 grossly intact.   Strength 5/5 in all 4.  Psychiatric: Normal judgment and insight. Alert and oriented x 3. Normal mood.   Data Reviewed:  EKG revealed atrial flutter at 106 bpm with 2-1 AV block.  Reviewed labs, imaging, and pertinent records as documented.  Assessment and Plan:  Heart failure with preserved ejection fraction Acute on chronic.  Patient presents with acute worsening of shortness of breath this morning.  Chest x-ray noted diffuse interstitial opacities and trace bilateral pleural effusions.  BNP noted to be elevated at 519.3.  Echocardiogram noted EF to be 65 to 70% with indeterminate diastolic parameters.  Suspecting symptoms secondary to acute on chronic heart failure exacerbation versus possible pneumonia.  Patient has been given Lasix  40 mg IV x 1 dose while in the ED. - Admit to a cardiac telemetry bed -Nasal cannula oxygen as needed to maintain O2 saturation greater than 90%. - Strict I&Os  and daily weights - Check procalcitonin and respiratory virus panel - Continue Lasix  20 mg IV twice daily -  cardiology consulted with recommendations  Paroxysmal atrial flutter on chronic anticoagulation Heart rate is noted to be elevated into the 120s.  If.  She had been  given Cardizem  10 mg IV.  During last hospitalization patient had been taken off of Cardizem  and placed on amiodarone  and metoprolol  for rate control.  Unclear if atrial fibrillation is leading to exacerbations of heart failure.  During last hospitalization it appears cardiology discussed possible need for cardioversion. - Goal potassium at least 4 and magnesium  at least 2 - Continue amiodarone , metoprolol , and Eliquis   Essential hypertension Patient was noted to be hypertensive with blood pressures elevated up to 191/91.  During last hospitalization patient had been on midodrine  due to hypotension and medications like Entresto  had to be discontinued. - Held midodrine .  Continue to monitor and assess if needing to resume while diuresing - Continue metoprolol   Status post mitral valve repair Back in 2016 by Dr. Kerrin.  At cardiogram noted mitral valve to have been repaired/replaced with mild to moderate mitral valve regurgitation, mild mitral stenosis, and mean mitral valve gradient noted to be 5.5 mmHg.   Coronary artery disease Stable.  Patient denies any complaints of chest pain.  Last heart cath back in 02/17/2015 noted widely patent currently arteries with minimal irregularities noted in the LAD, diagonal, and mid right coronary arteries. - Continue statin  Hyperlipidemia - Continue Crestor   Hypothyroidism TSH noted to be elevated at 8.721 on 6/21. - Recheck TSH - Continue levothyroxine    DVT prophylaxis: Eliquis  Advance Care Planning:   Code Status: Do not attempt resuscitation (DNR) PRE-ARREST INTERVENTIONS DESIRED    Consults: Cardiology  Family Communication: Son updated at bedside  Severity of Illness: The appropriate patient status for this patient is INPATIENT. Inpatient status is judged to be  reasonable and necessary in order to provide the required intensity of service to ensure the patient's safety. The patient's presenting symptoms, physical exam findings, and initial radiographic and laboratory data in the context of their chronic comorbidities is felt to place them at high risk for further clinical deterioration. Furthermore, it is not anticipated that the patient will be medically stable for discharge from the hospital within 2 midnights of admission.   * I certify that at the point of admission it is my clinical judgment that the patient will require inpatient hospital care spanning beyond 2 midnights from the point of admission due to high intensity of service, high risk for further deterioration and high frequency of surveillance required.*  Author: Maximino DELENA Sharps, MD 07/06/2024 2:45 PM  For on call review [email or web address].

## 2024-07-07 DIAGNOSIS — N179 Acute kidney failure, unspecified: Secondary | ICD-10-CM

## 2024-07-07 DIAGNOSIS — I2510 Atherosclerotic heart disease of native coronary artery without angina pectoris: Secondary | ICD-10-CM | POA: Diagnosis not present

## 2024-07-07 DIAGNOSIS — I4892 Unspecified atrial flutter: Secondary | ICD-10-CM | POA: Diagnosis not present

## 2024-07-07 DIAGNOSIS — I10 Essential (primary) hypertension: Secondary | ICD-10-CM | POA: Diagnosis not present

## 2024-07-07 DIAGNOSIS — I5033 Acute on chronic diastolic (congestive) heart failure: Secondary | ICD-10-CM | POA: Diagnosis not present

## 2024-07-07 LAB — BASIC METABOLIC PANEL WITH GFR
Anion gap: 9 (ref 5–15)
BUN: 18 mg/dL (ref 8–23)
CO2: 27 mmol/L (ref 22–32)
Calcium: 9.6 mg/dL (ref 8.9–10.3)
Chloride: 105 mmol/L (ref 98–111)
Creatinine, Ser: 1.38 mg/dL — ABNORMAL HIGH (ref 0.44–1.00)
GFR, Estimated: 36 mL/min — ABNORMAL LOW (ref >60)
Glucose, Bld: 99 mg/dL (ref 70–99)
Potassium: 3.8 mmol/L (ref 3.5–5.1)
Sodium: 141 mmol/L (ref 135–145)

## 2024-07-07 LAB — CBC
HCT: 41.5 % (ref 36.0–46.0)
Hemoglobin: 13.5 g/dL (ref 12.0–15.0)
MCH: 32.5 pg (ref 26.0–34.0)
MCHC: 32.5 g/dL (ref 30.0–36.0)
MCV: 100 fL (ref 80.0–100.0)
Platelets: 160 K/uL (ref 150–400)
RBC: 4.15 MIL/uL (ref 3.87–5.11)
RDW: 14.4 % (ref 11.5–15.5)
WBC: 5.9 K/uL (ref 4.0–10.5)
nRBC: 0 % (ref 0.0–0.2)

## 2024-07-07 LAB — MAGNESIUM: Magnesium: 2.1 mg/dL (ref 1.7–2.4)

## 2024-07-07 MED ORDER — METOPROLOL TARTRATE 12.5 MG HALF TABLET
12.5000 mg | ORAL_TABLET | Freq: Three times a day (TID) | ORAL | Status: DC
  Administered 2024-07-07 (×3): 12.5 mg via ORAL
  Filled 2024-07-07 (×3): qty 1

## 2024-07-07 MED ORDER — MIDODRINE HCL 5 MG PO TABS
5.0000 mg | ORAL_TABLET | Freq: Three times a day (TID) | ORAL | Status: DC
  Administered 2024-07-07 – 2024-07-09 (×6): 5 mg via ORAL
  Filled 2024-07-07 (×7): qty 1

## 2024-07-07 NOTE — Progress Notes (Signed)
 Heart Failure Navigator Progress Note  Assessed for Heart & Vascular TOC clinic readiness.  Patient does not meet criteria due to EF 65-70%, Has a scheduled [facility] appointment on 07/25/2024. No HF TOC per Dr. Noralee. .   Navigator will sign off at this time.   Stephane Haddock, BSN, Scientist, clinical (histocompatibility and immunogenetics) Only

## 2024-07-07 NOTE — Progress Notes (Signed)
 Progress Note  Patient Name: Amy Fischer Date of Encounter: 07/07/2024  Primary Cardiologist:   Redell Shallow, MD   Subjective   No chest pain.  Still some SOB.   Inpatient Medications    Scheduled Meds:  amiodarone   200 mg Oral Daily   apixaban   2.5 mg Oral BID   ezetimibe   10 mg Oral Daily   furosemide   20 mg Intravenous BID   levothyroxine   100 mcg Oral Q0600   metoprolol  succinate  12.5 mg Oral QHS   rOPINIRole   0.25 mg Oral QHS   rosuvastatin   2.5 mg Oral Daily   sodium chloride  flush  3 mL Intravenous Q12H   Continuous Infusions:  PRN Meds: acetaminophen  **OR** acetaminophen , albuterol , Polyethyl Glycol-Propyl Glycol   Vital Signs    Vitals:   07/06/24 2024 07/06/24 2103 07/07/24 0002 07/07/24 0456  BP: 133/79 133/79 114/64 (!) 96/58  Pulse: (!) 108 (!) 105 93 90  Resp: 20  20 20   Temp: 97.6 F (36.4 C)  97.6 F (36.4 C) 97.6 F (36.4 C)  TempSrc: Oral  Oral Oral  SpO2: 95%  99% 99%  Weight:    43.7 kg  Height:        Intake/Output Summary (Last 24 hours) at 07/07/2024 0723 Last data filed at 07/07/2024 0504 Gross per 24 hour  Intake --  Output 550 ml  Net -550 ml   Filed Weights   07/06/24 1920 07/07/24 0456  Weight: 44.4 kg 43.7 kg    Telemetry    Atrial flutter with rate relatively well controlled.  - Personally Reviewed  ECG    Atrial flutter with variable conduction - Personally Reviewed  Physical Exam   GEN: No acute distress.   Neck: No  JVD Cardiac: Irregular RR, 2/6 holosystolic murmur, no diastolic murmurs, rubs, or gallops.  Respiratory: Clear  to auscultation bilaterally. GI: Soft, nontender, non-distended  MS: No  edema; No deformity. Neuro:  Nonfocal  Psych: Normal affect   Labs    Chemistry Recent Labs  Lab 06/30/24 1144 07/06/24 1051 07/07/24 0236  NA 140 142 141  K 4.7 4.1 3.8  CL 103 106 105  CO2 25 23 27   GLUCOSE 77 117* 99  BUN 22 14 18   CREATININE 1.29* 1.07* 1.38*  CALCIUM  9.8 10.0 9.6   PROT  --  7.1  --   ALBUMIN   --  3.4*  --   AST  --  24  --   ALT  --  15  --   ALKPHOS  --  59  --   BILITOT  --  0.7  --   GFRNONAA  --  48* 36*  ANIONGAP  --  13 9     Hematology Recent Labs  Lab 07/06/24 1051 07/07/24 0236  WBC 7.5 5.9  RBC 4.03 4.15  HGB 13.3 13.5  HCT 42.4 41.5  MCV 105.2* 100.0  MCH 33.0 32.5  MCHC 31.4 32.5  RDW 14.6 14.4  PLT 169 160    Cardiac EnzymesNo results for input(s): TROPONINI in the last 168 hours. No results for input(s): TROPIPOC in the last 168 hours.   BNP Recent Labs  Lab 07/06/24 1051  BNP 519.3*     DDimer No results for input(s): DDIMER in the last 168 hours.   Radiology    DG Chest Port 1 View Result Date: 07/06/2024 CLINICAL DATA:  sob EXAM: PORTABLE CHEST - 1 VIEW COMPARISON:  06/17/24 FINDINGS: Coarsening of the pulmonary interstitium  with interval worsening. Patchy airspace opacities in the right lung base. Trace bilateral pleural effusions. No pneumothorax. Moderate cardiomegaly. Mitral annuloplasty. Sternotomy wires. Tortuous aorta with aortic atherosclerosis. No acute fracture or destructive lesions. Multilevel thoracic osteophytosis. Osteopenia. Surgical clips in the left supraclavicular fossa. IMPRESSION: 1. Worsening interstitial opacities throughout both lungs, which may reflect atypical infection or viral pneumonia. Patchy airspace opacities in the right lung base, which may reflect atelectasis or a superimposed bronchopneumonia, in the correct clinical context. 2. Trace bilateral pleural effusions. Electronically Signed   By: Rogelia Myers M.D.   On: 07/06/2024 11:32    Cardiac Studies   Echo 05/2024  1. Left ventricular ejection fraction, by estimation, is 65 to 70%. The  left ventricle has normal function. The left ventricle has no regional  wall motion abnormalities. Left ventricular diastolic parameters are  indeterminate.   2. Right ventricular systolic function is low normal. The right   ventricular size is mildly enlarged. There is moderately elevated  pulmonary artery systolic pressure. The estimated right ventricular  systolic pressure is 48.6 mmHg.   3. Left atrial size was mildly dilated.   4. Right atrial size was mildly dilated.   5. The mitral valve has been repaired/replaced. Mild to moderate mitral  valve regurgitation. Mild mitral stenosis. The mean mitral valve gradient  is 5.5 mmHg. There is a 28 mm Carpentier prosthetic annuloplasty ring  present in the mitral position.  Procedure Date: 10/10/2015.   6. Tricuspid valve regurgitation is severe.   7. The aortic valve is tricuspid. There is moderate calcification of the  aortic valve. Aortic valve regurgitation is trivial. No aortic stenosis is  present.   8. The inferior vena cava is normal in size with greater than 50%  respiratory variability, suggesting right atrial pressure of 3 mmHg.    Patient Profile     89 y.o. female  with a hx of chronic HFpEF, HTN, HLD, PAF, severe MR s/p mitral valve repair 2016 with mild-moderate MR, mild MS, severe TR, nonobstructive CAD, mild carotid artery disease (2016), hypothyroidism, esophageal spasm, arthritis who is being seen 07/06/2024 for the evaluation of Afib and CHF at the request of Dr. Claudene.   Assessment & Plan    Acute on chronic HFpEF:   Intake and output is incomplete.  Creat is up slightly but OK to continue diuresis today and watch creat.  Family would be interested in home O2 if she qualifies. See management of atrial fib below. I will change to metop tartrate and see if we can slightly increase the dose from home despite low BP addressed below.      Atrial fib:  Given the underlying lung process with abnormal CXR and other comorbid conditions as well as age and frailty, I am not going to suggest DCCV.  Will use low dose beta blocker for rate control as her BP allows.  I am going to stop amio given the abnormal CXR.    Continue Eliquis .   Accelerated HTN:   BP is now trended down.   Resume midodrine .      Abnormal CXR:  Plan per primary team.  Resp panel negative.  Will continue diuresis as tolerated.    Nonobstructive CAD:  Continue risk reduction.    H/o MV repair with mild-moderate MR, mild MS, severe TR by echo:  As above.  No further imaging.  She is not an interventional candidate.    Hypothyroidism:  TSH 24.  Plan per primary team.  For questions or updates, please contact [facility] Please consult [email or web address] for contact info under Cardiology/STEMI.   Signed, Lynwood Schilling, MD  07/07/2024, 7:23 AM

## 2024-07-07 NOTE — Assessment & Plan Note (Addendum)
 Continue ezetimibe  and rosuvastatin   Metoprolol .

## 2024-07-07 NOTE — Assessment & Plan Note (Signed)
 Renal function with serum cr at 1,38 with K at 3,8 and serum bicarbonate at 27  Na 141 Continue diuresis and follow up renal function and electrolytes in am.

## 2024-07-07 NOTE — Progress Notes (Addendum)
 Progress Note   Patient: Amy Fischer FMW:[id number] DOB: 04-20-1929 DOA: 07/06/2024     1 DOS: the patient was seen and examined on 07/07/2024   Brief hospital course: Amy Fischer was admitted to the hospital with the working diagnosis of heart failure exacerbation.  89 yo female with the past medical history of hypertension, hyperlipidemia, diastolic heart failure, atrial fibrillation, hypothyroidism, and carotid artery stenosis who presented with dyspnea.  Recent hospitalization 06/20 to 06/21/24 for heart failure exacerbation. She was discharged with 20 mg furosemide  as needed for dyspnea or edema.  At home she developed worsening dyspnea, that was refractive to oral furosemide . EMS was called and she was found in respiratory distress, and with a blood pressure of 200/120 mmHg, she was placed on Cpap and was transported to the ED.  On her initial physical examination her blood pressure was 161/78, HR 116, RR 26 and 02 saturation 99% on 3 L/min per [place]. Lungs with rales bilaterally, with no wheezing, increased work of breathing, heart with S1 and S2 present, irregularly irregular with no gallops or rubs, abdomen with no distention and no lower extremity edema.   Na 142, K 4.1 Cl 106 bicarbonate 23, glucose 117 bun 14 cr 1,0  AST 24 ALT 15  BNP 519  High sensitive troponin 10 and 12.  CRP < 0.10  Wbc 7.5 hgb 13.3 p;t 169  TSH 23.9  Respiratory Viral panel negative  SARS COVID 19 negative  Influenza negative   Chest radiograph with hyperinflation, cardiomegaly, bilateral hilar vascular congestion, bilateral basal atelectasis, sternotomy wires in place.   EKG 106 bpm, normal axis, qtc 552, atrial fibrillation rhythm with PVC, with no significant ST segment or T wave changes.    Assessment and Plan: * Acute on chronic diastolic CHF (congestive heart failure) (HCC) Echocardiogram with preserved LV systolic function EF 65 to 70%, RV systolic function low normal, mild RV enlargement, RVSP  48,6 mmHg, mild dilatation RA and LA, mild to moderate mitral regurgitation (sp replacement), severe tricuspid regurgitation, no aortic stenosis.   Acute on chronic core pulmonale.   Documented urine output 500 cc Weight loss about 1 kg for the last 24 hrs.   Plan to continue medical therapy with metoprolol  and diuresis with furosemide  IV 20 mg bid.  Blood pressure support with midodrine     Paroxysmal atrial flutter (HCC) Continue rate control with metoprolol .  Anticoagulation with apixaban   Essential hypertension Continue blood pressure control with metoprolol .   CAD (coronary artery disease) Continue ezetimibe  and rosuvastatin   Metoprolol .   AKI (acute kidney injury) (HCC) Renal function with serum cr at 1,38 with K at 3,8 and serum bicarbonate at 27  Na 141 Continue diuresis and follow up renal function and electrolytes in am.   Hypothyroidism Continue levothyroxine ., (elevated TSH)   Hyperlipidemia Continue with ezetimibe  and statin therapy    Subjective: Patient is feeling better, dyspnea is improving, along with lower extremity edema, no palpitations and no chest pain.   Physical Exam: Vitals:   07/07/24 0456 07/07/24 0717 07/07/24 0942 07/07/24 1157  BP: (!) 96/58 103/78 103/78 118/85  Pulse: 90 (!) 113 (!) 115   Resp: 20 20  20   Temp: 97.6 F (36.4 C) 97.6 F (36.4 C)  97.7 F (36.5 C)  TempSrc: Oral Oral  Oral  SpO2: 99% 99%  99%  Weight: 43.7 kg     Height:       Neurology awake and alert, deconditioned ENT with mild pallor Cardiovascular with S1 and  S2 present, irregularly irregular with no gallops or rubs, positive systolic murmur at the right lower sternal border No JVD No lower extremity edema Respiratory with mild rales at bases with no wheezing or rhonchi,  Abdomen with no distention   Data Reviewed:    Family Communication: I spoke with patient's children at the bedside, we talked in detail about patient's condition, plan of care and  prognosis and all questions were addressed.   Disposition: Status is: Inpatient Remains inpatient appropriate because: heart failure   Planned Discharge Destination: Home    Author: Elidia Toribio Furnace, MD 07/07/2024 1:53 PM  For on call review [email or web address].

## 2024-07-07 NOTE — Assessment & Plan Note (Signed)
 Continue levothyroxine ., (elevated TSH)

## 2024-07-07 NOTE — Assessment & Plan Note (Signed)
 Continue blood pressure control with metoprolol.

## 2024-07-07 NOTE — Hospital Course (Addendum)
 Amy Fischer was admitted to the hospital with the working diagnosis of heart failure exacerbation.  89 yo female with the past medical history of hypertension, hyperlipidemia, diastolic heart failure, atrial fibrillation, hypothyroidism, and carotid artery stenosis who presented with dyspnea.  Recent hospitalization 06/20 to 06/21/24 for heart failure exacerbation. She was discharged with 20 mg furosemide  as needed for dyspnea or edema.  At home she developed worsening dyspnea, that was refractive to oral furosemide . EMS was called and she was found in respiratory distress, and with a blood pressure of 200/120 mmHg, she was placed on Cpap and was transported to the ED.  On her initial physical examination her blood pressure was 161/78, HR 116, RR 26 and 02 saturation 99% on 3 L/min per [place]. Lungs with rales bilaterally, with no wheezing, increased work of breathing, heart with S1 and S2 present, irregularly irregular with no gallops or rubs, abdomen with no distention and no lower extremity edema.   Na 142, K 4.1 Cl 106 bicarbonate 23, glucose 117 bun 14 cr 1,0  AST 24 ALT 15  BNP 519  High sensitive troponin 10 and 12.  CRP < 0.10  Wbc 7.5 hgb 13.3 p;t 169  TSH 23.9  Respiratory Viral panel negative  SARS COVID 19 negative  Influenza negative   Chest radiograph with hyperinflation, cardiomegaly, bilateral hilar vascular congestion, bilateral basal atelectasis, sternotomy wires in place.   EKG 106 bpm, normal axis, qtc 552, atrial fibrillation rhythm with PVC, with no significant ST segment or T wave changes.

## 2024-07-07 NOTE — TOC CM/SW Note (Signed)
 Transition of Care [facility]) - Inpatient Brief Assessment   Patient Details  Name: AMELIAH BASKINS MRN: [id number] Date of Birth: 1929/02/08  [facility]) CM/SW Contact:    Waddell Barnie Rama, RN Phone Number: 07/07/2024, 4:25 PM   Clinical Narrative: From home alone, has PCP and insurance on file, states has no HH services in place at this time  but she has a private physical therarpist [facility]  , she has a walker, cane and bsc home.  States family member will transport them ome at [employer] and family is support system, states gets medications from [employer] at [facility].  Pta self ambulatory with walker/cane.  Patient would like for everyone to talk to her first before talking to a family member.   Transition of Care Asessment: Insurance and Status: Insurance coverage has been reviewed Patient has primary care physician: Yes Home environment has been reviewed: home alone Prior level of function:: ambulatory with walker/cane Prior/Current Home Services: Current home services (walker, cane, bsc) Social Drivers of Health Review: SDOH reviewed no interventions necessary Readmission risk has been reviewed: Yes Transition of care needs: no transition of care needs at this time

## 2024-07-07 NOTE — Assessment & Plan Note (Signed)
 Echocardiogram with preserved LV systolic function EF 65 to 70%, RV systolic function low normal, mild RV enlargement, RVSP 48,6 mmHg, mild dilatation RA and LA, mild to moderate mitral regurgitation (sp replacement), severe tricuspid regurgitation, no aortic stenosis.   Acute on chronic core pulmonale.   Documented urine output 500 cc Weight loss about 1 kg for the last 24 hrs.   Plan to continue medical therapy with metoprolol  and diuresis with furosemide  IV 20 mg bid.  Blood pressure support with midodrine 

## 2024-07-07 NOTE — Assessment & Plan Note (Signed)
Continue rate control with metoprolol.  Anticoagulation with apixaban.

## 2024-07-07 NOTE — Assessment & Plan Note (Signed)
 Continue with ezetimibe and statin therapy.

## 2024-07-07 NOTE — Plan of Care (Signed)
  Problem: Clinical Measurements: Goal: Will remain free from infection Outcome: Progressing Goal: Respiratory complications will improve Outcome: Progressing Goal: Cardiovascular complication will be avoided Outcome: Progressing   Problem: Coping: Goal: Level of anxiety will decrease Outcome: Progressing   Problem: Elimination: Goal: Will not experience complications related to urinary retention Outcome: Progressing   Problem: Safety: Goal: Ability to remain free from injury will improve Outcome: Progressing

## 2024-07-08 DIAGNOSIS — I5033 Acute on chronic diastolic (congestive) heart failure: Secondary | ICD-10-CM | POA: Diagnosis not present

## 2024-07-08 LAB — BASIC METABOLIC PANEL WITH GFR
Anion gap: 9 (ref 5–15)
BUN: 22 mg/dL (ref 8–23)
CO2: 28 mmol/L (ref 22–32)
Calcium: 9.2 mg/dL (ref 8.9–10.3)
Chloride: 101 mmol/L (ref 98–111)
Creatinine, Ser: 1.17 mg/dL — ABNORMAL HIGH (ref 0.44–1.00)
GFR, Estimated: 44 mL/min — ABNORMAL LOW (ref >60)
Glucose, Bld: 96 mg/dL (ref 70–99)
Potassium: 3.6 mmol/L (ref 3.5–5.1)
Sodium: 138 mmol/L (ref 135–145)

## 2024-07-08 LAB — MAGNESIUM: Magnesium: 2 mg/dL (ref 1.7–2.4)

## 2024-07-08 MED ORDER — METOPROLOL TARTRATE 25 MG PO TABS
25.0000 mg | ORAL_TABLET | Freq: Two times a day (BID) | ORAL | Status: DC
  Administered 2024-07-08 – 2024-07-09 (×2): 25 mg via ORAL
  Filled 2024-07-08 (×3): qty 1

## 2024-07-08 NOTE — TOC Transition Note (Signed)
 Transition of Care [facility]) - Discharge Note   Patient Details  Name: Amy Fischer MRN: [id number] Date of Birth: [birth year]  Transition of Care [facility]) CM/SW Contact:  Waddell Barnie Rama, RN Phone Number: 07/08/2024, 10:30 AM   Clinical Narrative:    For possible dc today, her daughter will transport her home at dc.  She has her own private physical therapist.         Patient Goals and CMS Choice            Discharge Placement                       Discharge Plan and Services Additional resources added to the After Visit Summary for                                       Social Drivers of Health (SDOH) Interventions SDOH Screenings   Food Insecurity: No Food Insecurity (07/06/2024)  Housing: Low Risk  (07/06/2024)  Transportation Needs: No Transportation Needs (07/06/2024)  Utilities: Not At Risk (07/06/2024)  Alcohol  Screen: Low Risk  (07/04/2024)  Depression (PHQ2-9): Low Risk  (06/23/2024)  Financial Resource Strain: Low Risk  (07/04/2024)  Physical Activity: Insufficiently Active (07/04/2024)  Social Connections: Moderately Integrated (07/06/2024)  Stress: No Stress Concern Present (07/04/2024)  Tobacco Use: Medium Risk (07/06/2024)  Health Literacy: Adequate Health Literacy (01/04/2024)     Readmission Risk Interventions    07/07/2024    4:24 PM  Readmission Risk Prevention Plan  Medication Screening Complete  Transportation Screening Complete

## 2024-07-08 NOTE — Evaluation (Signed)
 Occupational Therapy Evaluation Patient Details Name: Amy Fischer MRN: [id number] DOB: April 10, 1929 Today's Date: 07/08/2024   History of Present Illness   Pt is a 89 y.o female admitted 7/9 c/o of SOB. Recent hospitalization 6/20-6/24 for respiratory failure d/t CHF. Chest x-ray showing concerns for pneumonia. PMH: COPD, CHF, HLD, OA, osteoporosis, HTN, hypothyroidism, afib     Clinical Impressions  Pt admitted based on above, and was seen based on problem list below. PTA pt was Mod independent with ADLs and IADLs. Today pt is requiring s for safety for ADLs. Bed mobility and functional transfers are  s for safety with use of RW. VSS on RA during standing ADLs and mobility. Pt would benefit from [facility] to continue to build activity tolerance and promote safety within the home. OT will continue to follow acutely to maximize functional independence.     If plan is discharge home, recommend the following:   Assistance with cooking/housework     Functional Status Assessment   Patient has had a recent decline in their functional status and demonstrates the ability to make significant improvements in function in a reasonable and predictable amount of time.     Equipment Recommendations   None recommended by OT      Precautions/Restrictions   Precautions Precautions: Fall;Other (comment) Recall of Precautions/Restrictions: Intact Precaution/Restrictions Comments: watch HR & SpO2 Restrictions Weight Bearing Restrictions Per Provider Order: No     Mobility Bed Mobility Overal bed mobility: Modified Independent     General bed mobility comments: Increased time    Transfers Overall transfer level: Needs assistance Equipment used: Rolling walker (2 wheels) Transfers: Sit to/from Stand Sit to Stand: Contact guard assist     General transfer comment: Extra time and 2x attempts to come to stand but no assist      Balance Overall balance assessment: Needs  assistance Sitting-balance support: Feet supported, No upper extremity supported Sitting balance-Leahy Scale: Good     Standing balance support: Bilateral upper extremity supported, During functional activity, Reliant on assistive device for balance Standing balance-Leahy Scale: Poor Standing balance comment: reliant on RW to ambulate       ADL either performed or assessed with clinical judgement   ADL Overall ADL's : Needs assistance/impaired Eating/Feeding: Independent;Sitting   Grooming: Wash/dry face;Oral care;Brushing hair;Supervision/safety;Standing       Upper Body Dressing : Set up;Sitting   Lower Body Dressing: Supervision/safety;Sit to/from stand Lower Body Dressing Details (indicate cue type and reason): Able to reach BLEs, uses AE at baseline Toilet Transfer: Supervision/safety;Rolling walker (2 wheels);Ambulation Toilet Transfer Details (indicate cue type and reason): Simulated in room Toileting- Clothing Manipulation and Hygiene: Supervision/safety;Sit to/from stand       Functional mobility during ADLs: Supervision/safety General ADL Comments: Educated pt on energy conservation strategies for ADLs     Vision Baseline Vision/History: 0 No visual deficits Vision Assessment?: No apparent visual deficits            Pertinent Vitals/Pain Pain Assessment Pain Assessment: No/denies pain Faces Pain Scale: No hurt Pain Intervention(s): Monitored during session     Extremity/Trunk Assessment Upper Extremity Assessment Upper Extremity Assessment: Generalized weakness   Lower Extremity Assessment Lower Extremity Assessment: Defer to PT evaluation   Cervical / Trunk Assessment Cervical / Trunk Assessment: Normal   Communication Communication Communication: No apparent difficulties   Cognition Arousal: Alert Behavior During Therapy: WFL for tasks assessed/performed Cognition: No apparent impairments       Following commands: Intact  Cueing  General Comments   Cueing Techniques: Verbal cues  VSS on RA. Access Code: [email or web address]  URL: [email or web address]  Date: 07/08/2024  Prepared by: Adrianne Savers    Patient Education  - Understanding Energy Conservation  - Energy Conservation During Daily Tasks\           Home Living Family/patient expects to be discharged to:: Private residence Living Arrangements: Alone Available Help at Discharge: Family;Available PRN/intermittently (son and daughter nearby) Type of Home: House (townhouse) Home Access: Stairs to enter Secretary/administrator of Steps: 1 Entrance Stairs-Rails: Left Home Layout: One level     Bathroom Shower/Tub: Producer, television/film/video: Handicapped height Bathroom Accessibility: Yes   Home Equipment: Agricultural consultant (2 wheels);Rollator (4 wheels);Cane - single point;BSC/3in1;Shower seat;Grab bars - tub/shower;Other (comment);Adaptive equipment (3-wheeled walker) Adaptive Equipment: Reacher;Sock aid;Long-handled shoe horn Additional Comments: Pt reports a PT comes out twice a week to work with her currently      Prior Functioning/Environment Prior Level of Function : Independent/Modified Independent;History of Falls (last six months);Driving       Mobility Comments: Reports x1 fall in past 6 months (conflicts with prior entry in June which reported x2 though). She reports she falls every 2 years. Uses 3-wheeled walker outside but rollator inside. Intermittently furniture surfs though ADLs Comments: still drives; someone comes to clean 1x/month    OT Problem List: Decreased strength;Decreased activity tolerance;Impaired balance (sitting and/or standing);Cardiopulmonary status limiting activity   OT Treatment/Interventions: Self-care/ADL training;Therapeutic exercise;Energy conservation;DME and/or AE instruction;Therapeutic activities;Patient/family education;Balance training      OT Goals(Current goals can be found in the care  plan section)   Acute Rehab OT Goals Patient Stated Goal: To go home OT Goal Formulation: With patient Time For Goal Achievement: 07/22/24 Potential to Achieve Goals: Good   OT Frequency:  Min 2X/week       AM-PAC OT 6 Clicks Daily Activity     Outcome Measure Help from another person eating meals?: None Help from another person taking care of personal grooming?: A Little Help from another person toileting, which includes using toliet, bedpan, or urinal?: A Little Help from another person bathing (including washing, rinsing, drying)?: A Little Help from another person to put on and taking off regular upper body clothing?: A Little Help from another person to put on and taking off regular lower body clothing?: A Little 6 Click Score: 19   End of Session Equipment Utilized During Treatment: Rolling walker (2 wheels) Nurse Communication: Mobility status  Activity Tolerance: Patient tolerated treatment well Patient left: in bed;with call bell/phone within reach  OT Visit Diagnosis: Unsteadiness on feet (R26.81);Other abnormalities of gait and mobility (R26.89)                Time: 9076-9049 OT Time Calculation (min): 27 min Charges:  OT General Charges $OT Visit: 1 Visit OT Evaluation $OT Eval Low Complexity: 1 Low OT Treatments $Self Care/Home Management : 8-22 mins  Adrianne BROCKS, OT  Acute Rehabilitation Services Office [phone] Secure chat preferred   Adrianne GORMAN Savers 07/08/2024, 10:07 AM

## 2024-07-08 NOTE — Evaluation (Signed)
 Physical Therapy Evaluation Patient Details Name: Amy Fischer MRN: [id number] DOB: 01/15/1929 Today's Date: 07/08/2024  History of Present Illness  Pt is a 89 y.o female admitted 7/9 c/o of SOB. Recent hospitalization 6/20-6/24 for respiratory failure d/t CHF. Chest x-ray showing concerns for pneumonia. PMH: COPD, CHF, HLD, OA, osteoporosis, HTN, hypothyroidism, afib  Clinical Impression  Pt presents with condition above and deficits mentioned below, see PT Problem List. PTA, she was mod I for functional mobility intermittently furniture surfing but otherwise utilizing a rollator inside her home and a 3-wheeled walker outside. She lives alone in a 1-level townhouse with 1 STE and has PRN support available to her from her son and daughter that live nearby. Currently, the pt is demonstrating deficits in generalized strength, balance, and endurance. She needed multiple attempts before successfully transferring to stand with CGA for safety. She was able to ambulate with a RW without LOB or assistance, supervision was provided for safety only. Educated pt on managing her CHF by reducing sodium/processed foods intake, eating fresh/frozen fruits/veggies or rinsing canned foods, frequent mobility, and weighing self daily. She verbalized understanding. Will continue to follow acutely. As the pt appears to be near her baseline and likely will quickly recover the more frequently she mobilizes, will recommend she return home and resume HHPT.        If plan is discharge home, recommend the following: A little help with walking and/or transfers;A little help with bathing/dressing/bathroom;Assistance with cooking/housework;Assist for transportation;Help with stairs or ramp for entrance   Can travel by private vehicle        Equipment Recommendations None recommended by PT (has all needed DME)  Recommendations for Other Services       Functional Status Assessment Patient has had a recent decline in their  functional status and demonstrates the ability to make significant improvements in function in a reasonable and predictable amount of time.     Precautions / Restrictions Precautions Precautions: Fall;Other (comment) Recall of Precautions/Restrictions: Intact Precaution/Restrictions Comments: watch HR & SpO2 Restrictions Weight Bearing Restrictions Per Provider Order: No      Mobility  Bed Mobility               General bed mobility comments: Pt sitting EOB upon arrival. Sitting in recliner end of session.    Transfers Overall transfer level: Needs assistance Equipment used: Rolling walker (2 wheels) Transfers: Sit to/from Stand Sit to Stand: Contact guard assist           General transfer comment: Extra time and several attempts along with cues to scoot to edge and push up on bed before pt was successful in coming to stand, CGA for safety    Ambulation/Gait Ambulation/Gait assistance: Supervision Gait Distance (Feet): 130 Feet Assistive device: Rolling walker (2 wheels) Gait Pattern/deviations: Step-through pattern, Decreased stride length, Decreased step length - right, Decreased step length - left, Trunk flexed Gait velocity: reduced Gait velocity interpretation: <1.31 ft/sec, indicative of household ambulator   General Gait Details: Pt takes slow, smaller steps with a slightly flexed posture. No LOB, supervision for safety. Pt unable to dual task talking while walking, having to stop to talk each time.  Stairs            Wheelchair Mobility     Tilt Bed    Modified Rankin (Stroke Patients Only)       Balance Overall balance assessment: Needs assistance Sitting-balance support: Feet supported, No upper extremity supported Sitting balance-Leahy Scale: Good  Standing balance support: Bilateral upper extremity supported, During functional activity, Reliant on assistive device for balance Standing balance-Leahy Scale: Poor Standing balance  comment: reliant on RW to ambulate                             Pertinent Vitals/Pain Pain Assessment Pain Assessment: Faces Faces Pain Scale: No hurt Pain Intervention(s): Monitored during session    Home Living Family/patient expects to be discharged to:: Private residence Living Arrangements: Alone Available Help at Discharge: Family;Available PRN/intermittently (son and daughter nearby) Type of Home: House (townhouse) Home Access: Stairs to enter Entrance Stairs-Rails: Left Entrance Stairs-Number of Steps: 1   Home Layout: One level Home Equipment: Agricultural consultant (2 wheels);Rollator (4 wheels);Cane - single point;BSC/3in1;Shower seat;Grab bars - tub/shower;Other (comment) (3-wheeled walker)      Prior Function Prior Level of Function : Independent/Modified Independent;History of Falls (last six months);Driving             Mobility Comments: Reports x1 fall in past 6 months (conflicts with prior entry in June which reported x2 though). She reports she falls every 2 years. Uses 3-wheeled walker outside but rollator inside. Intermittently furniture surfs though ADLs Comments: still drives; someone comes to clean 1x/month     Extremity/Trunk Assessment   Upper Extremity Assessment Upper Extremity Assessment: Defer to OT evaluation    Lower Extremity Assessment Lower Extremity Assessment: Generalized weakness    Cervical / Trunk Assessment Cervical / Trunk Assessment: Normal  Communication   Communication Communication: No apparent difficulties    Cognition Arousal: Alert Behavior During Therapy: WFL for tasks assessed/performed   PT - Cognitive impairments: No apparent impairments                       PT - Cognition Comments: unsure if there may be some memory deficits as some reports pt made in regards to DME and quantity and frequency of falls this admission conflicts info from a recent prior admission in June. Pt unable to dual task  talking while walking, having to stop to talk each time. Following commands: Intact       Cueing Cueing Techniques: Verbal cues     General Comments General comments (skin integrity, edema, etc.): HR up to 120s; SpO2 >/= 92% on RA; educated pt on managing her CHF by reducing sodium/processed foods intake, eating fresh/frozen fruits/veggies or rinsing canned foods, frequent mobility, and weighing self daily; she verbalized understanding    Exercises     Assessment/Plan    PT Assessment Patient needs continued PT services  PT Problem List Decreased activity tolerance;Decreased balance;Decreased strength;Decreased mobility       PT Treatment Interventions DME instruction;Gait training;Stair training;Functional mobility training;Therapeutic activities;Therapeutic exercise;Balance training;Patient/family education;Neuromuscular re-education    PT Goals (Current goals can be found in the Care Plan section)  Acute Rehab PT Goals Patient Stated Goal: to go home PT Goal Formulation: With patient Time For Goal Achievement: 07/22/24 Potential to Achieve Goals: Good    Frequency Min 1X/week     Co-evaluation               AM-PAC PT 6 Clicks Mobility  Outcome Measure Help needed turning from your back to your side while in a flat bed without using bedrails?: A Little Help needed moving from lying on your back to sitting on the side of a flat bed without using bedrails?: A Little Help needed moving to and from a  bed to a chair (including a wheelchair)?: A Little Help needed standing up from a chair using your arms (e.g., wheelchair or bedside chair)?: A Little Help needed to walk in hospital room?: A Little Help needed climbing 3-5 steps with a railing? : A Little 6 Click Score: 18    End of Session Equipment Utilized During Treatment: Gait belt Activity Tolerance: Patient tolerated treatment well Patient left: in chair;with call bell/phone within reach;with chair alarm  set Nurse Communication: Mobility status;Other (comment) (doffed [place]) PT Visit Diagnosis: Unsteadiness on feet (R26.81);Other abnormalities of gait and mobility (R26.89);Muscle weakness (generalized) (M62.81);Difficulty in walking, not elsewhere classified (R26.2)    Time: 0810-0830 PT Time Calculation (min) (ACUTE ONLY): 20 min   Charges:   PT Evaluation $PT Eval Moderate Complexity: 1 Mod   PT General Charges $$ ACUTE PT VISIT: 1 Visit         Amy Fischer, PT, DPT Acute Rehabilitation Services  Office: [phone]   Amy Fischer 07/08/2024, 9:35 AM

## 2024-07-08 NOTE — Progress Notes (Signed)
  Progress Note   Patient: Amy Fischer FMW:[id number] DOB: [birth year] DOA: 07/06/2024     2 DOS: the patient was seen and examined on 07/08/2024   Brief hospital course: Patient is a 89 year old female with past medical history significant for diastolic congestive heart failure, atrial fibrillation, carotid artery stenosis, hyperlipidemia and hypothyroidism.  Patient was admitted with CHF exacerbation.  Patient has continued to improve.  07/08/2024: Patient seen alongside patient's daughter.  Patient is not keen on being discharged back home today.    Assessment and Plan: Acute on chronic diastolic CHF (congestive heart failure) (HCC):             -Echocardiogram with preserved LV systolic function EF 65 to 70%, RV systolic function low normal, mild RV enlargement, RVSP 48,6 mmHg, mild dilatation RA and LA, mild to moderate mitral regurgitation (sp replacement), severe tricuspid regurgitation, no aortic stenosis. - Patient continues to improve, however, not keen on being discharged back home today. - Likely discharge back in the morning. - Continue to monitor I's and O's.  Acute on chronic core pulmonale:    AKI (acute kidney injury) (HCC) -Serum creatinine of 1.17 today. - Continue to monitor renal function and electrolytes..   Paroxysmal atrial flutter (HCC) Continue rate control with metoprolol .  Anticoagulation with apixaban   CAD (coronary artery disease) Continue ezetimibe  and rosuvastatin   Metoprolol .   Essential hypertension Continue blood pressure control with metoprolol .   Hyperlipidemia Continue with ezetimibe  and statin therapy   Hypothyroidism Continue levothyroxine ., (elevated TSH)    Subjective: - Patient seen alongside patient's daughter. - Patient is nonacute and being discharged back in today.  Physical Exam: Vitals:   07/08/24 0417 07/08/24 0729 07/08/24 0730 07/08/24 1557  BP: 100/67 119/89  100/75  Pulse: 91 (!) 112 (!) 117 (!) 104  Resp: 18 18 (!)  22 20  Temp: (!) 97.5 F (36.4 C) (!) 97.5 F (36.4 C) (!) 97.5 F (36.4 C) (!) 97.5 F (36.4 C)  TempSrc: Oral Oral  Oral  SpO2: 96% 95% 95% 95%  Weight: 43.5 kg     Height:       General Condition: Not in any distress. - HEENT: Patient is pale. Neck: Supple. Lungs: Clear to auscultation. CVS: S1-S2. Abdomen: Soft and nontender. Neuro: Awake and alert. Extremities: No leg edema.  Data Reviewed:    Family Communication: I spoke with patient's children at the bedside, we talked in detail about patient's condition, plan of care and prognosis and all questions were addressed.   Disposition: Status is: Inpatient Remains inpatient appropriate because: heart failure   Planned Discharge Destination: Home    Author: Leatrice LILLETTE Chapel, MD 07/08/2024 7:46 PM  For on call review [email or web address].

## 2024-07-08 NOTE — Plan of Care (Signed)
  Problem: Education: Goal: Knowledge of General Education information will improve Description: Including pain rating scale, medication(s)/side effects and non-pharmacologic comfort measures Outcome: Progressing   Problem: Health Behavior/Discharge Planning: Goal: Ability to manage health-related needs will improve Outcome: Progressing   Problem: Clinical Measurements: Goal: Ability to maintain clinical measurements within normal limits will improve Outcome: Progressing Goal: Will remain free from infection Outcome: Progressing Goal: Respiratory complications will improve Outcome: Progressing Goal: Cardiovascular complication will be avoided Outcome: Progressing   Problem: Activity: Goal: Risk for activity intolerance will decrease Outcome: Progressing   Problem: Elimination: Goal: Will not experience complications related to urinary retention Outcome: Progressing   

## 2024-07-08 NOTE — TOC Progression Note (Signed)
 Transition of Care [facility]) - Progression Note    Patient Details  Name: SAMMI STOLARZ MRN: [id number] Date of Birth: Jun 27, 1929  Transition of Care [facility]) CM/SW Contact  Waddell Barnie Rama, RN Phone Number: 07/08/2024, 10:29 AM  Clinical Narrative:    Per PT/OT eval rec HHPT/HHOT,  NCM offered choice to patient.,  she states she has her own private duty physical therapist that she has had for two years and would like to keep her. So she did not choose any one from the Medicare. Gov list.        Expected Discharge Plan and Services                                               Social Determinants of Health (SDOH) Interventions SDOH Screenings   Food Insecurity: No Food Insecurity (07/06/2024)  Housing: Low Risk  (07/06/2024)  Transportation Needs: No Transportation Needs (07/06/2024)  Utilities: Not At Risk (07/06/2024)  Alcohol  Screen: Low Risk  (07/04/2024)  Depression (PHQ2-9): Low Risk  (06/23/2024)  Financial Resource Strain: Low Risk  (07/04/2024)  Physical Activity: Insufficiently Active (07/04/2024)  Social Connections: Moderately Integrated (07/06/2024)  Stress: No Stress Concern Present (07/04/2024)  Tobacco Use: Medium Risk (07/06/2024)  Health Literacy: Adequate Health Literacy (01/04/2024)    Readmission Risk Interventions    07/07/2024    4:24 PM  Readmission Risk Prevention Plan  Medication Screening Complete  Transportation Screening Complete

## 2024-07-08 NOTE — Progress Notes (Signed)
 Mobility Specialist Progress Note:   07/08/24 1550  Mobility  Activity Ambulated with assistance in hallway  Level of Assistance Standby assist, set-up cues, supervision of patient - no hands on  Assistive Device Front wheel walker  Distance Ambulated (ft) 200 ft  Activity Response Tolerated well  Mobility Referral Yes  Mobility visit 1 Mobility  Mobility Specialist Start Time (ACUTE ONLY) 1550  Mobility Specialist Stop Time (ACUTE ONLY) 1600  Mobility Specialist Time Calculation (min) (ACUTE ONLY) 10 min   Pt agreeable to mobility session. Required no physical assistance throughout, only supervision with RW. VSS on RA. Pt back in bed with all needs met.   Therisa Rana Mobility Specialist Please contact via SecureChat or  Rehab office at [phone]

## 2024-07-08 NOTE — Progress Notes (Signed)
 Progress Note  Patient Name: Amy Fischer Date of Encounter: 07/08/2024  Primary Cardiologist:   Redell Shallow, MD   Subjective   She said that she ambulated in the hallway.  Breathing is much better than on presentation.  No pain.   Inpatient Medications    Scheduled Meds:  apixaban   2.5 mg Oral BID   ezetimibe   10 mg Oral Daily   furosemide   20 mg Intravenous BID   levothyroxine   100 mcg Oral Q0600   metoprolol  tartrate  12.5 mg Oral TID   midodrine   5 mg Oral TID WC   rOPINIRole   0.25 mg Oral QHS   rosuvastatin   2.5 mg Oral Daily   sodium chloride  flush  3 mL Intravenous Q12H   Continuous Infusions:  PRN Meds: acetaminophen  **OR** acetaminophen , albuterol , Polyethyl Glycol-Propyl Glycol   Vital Signs    Vitals:   07/07/24 2355 07/08/24 0417 07/08/24 0729 07/08/24 0730  BP: 109/70 100/67 119/89   Pulse: (!) 108 91 (!) 112 (!) 117  Resp: 17 18 18  (!) 22  Temp: (!) 97.4 F (36.3 C) (!) 97.5 F (36.4 C) (!) 97.5 F (36.4 C) (!) 97.5 F (36.4 C)  TempSrc: Oral Oral Oral   SpO2: 99% 96% 95% 95%  Weight:  43.5 kg    Height:        Intake/Output Summary (Last 24 hours) at 07/08/2024 0753 Last data filed at 07/07/2024 2007 Gross per 24 hour  Intake --  Output 900 ml  Net -900 ml   Filed Weights   07/06/24 1920 07/07/24 0456 07/08/24 0417  Weight: 44.4 kg 43.7 kg 43.5 kg    Telemetry    Atrial flutter with variable conduction.    - Personally Reviewed  ECG    NA  - Personally Reviewed  Physical Exam   GEN: No  acute distress.   Neck: No  JVD Cardiac: RRR, no murmurs, rubs, or gallops.  Respiratory:     Decreased breath sounds. GI: Soft, nontender, non-distended, normal bowel sounds  MS:  No edema; No deformity. Neuro:   Nonfocal  Psych: Oriented and appropriate    Labs    Chemistry Recent Labs  Lab 07/06/24 1051 07/07/24 0236 07/08/24 0318  NA 142 141 138  K 4.1 3.8 3.6  CL 106 105 101  CO2 23 27 28   GLUCOSE 117* 99 96  BUN  14 18 22   CREATININE 1.07* 1.38* 1.17*  CALCIUM  10.0 9.6 9.2  PROT 7.1  --   --   ALBUMIN  3.4*  --   --   AST 24  --   --   ALT 15  --   --   ALKPHOS 59  --   --   BILITOT 0.7  --   --   GFRNONAA 48* 36* 44*  ANIONGAP 13 9 9      Hematology Recent Labs  Lab 07/06/24 1051 07/07/24 0236  WBC 7.5 5.9  RBC 4.03 4.15  HGB 13.3 13.5  HCT 42.4 41.5  MCV 105.2* 100.0  MCH 33.0 32.5  MCHC 31.4 32.5  RDW 14.6 14.4  PLT 169 160    Cardiac EnzymesNo results for input(s): TROPONINI in the last 168 hours. No results for input(s): TROPIPOC in the last 168 hours.   BNP Recent Labs  Lab 07/06/24 1051  BNP 519.3*     DDimer No results for input(s): DDIMER in the last 168 hours.   Radiology    DG Chest [facility]  Result Date: 07/06/2024 CLINICAL DATA:  sob EXAM: PORTABLE CHEST - 1 VIEW COMPARISON:  06/17/24 FINDINGS: Coarsening of the pulmonary interstitium with interval worsening. Patchy airspace opacities in the right lung base. Trace bilateral pleural effusions. No pneumothorax. Moderate cardiomegaly. Mitral annuloplasty. Sternotomy wires. Tortuous aorta with aortic atherosclerosis. No acute fracture or destructive lesions. Multilevel thoracic osteophytosis. Osteopenia. Surgical clips in the left supraclavicular fossa. IMPRESSION: 1. Worsening interstitial opacities throughout both lungs, which may reflect atypical infection or viral pneumonia. Patchy airspace opacities in the right lung base, which may reflect atelectasis or a superimposed bronchopneumonia, in the correct clinical context. 2. Trace bilateral pleural effusions. Electronically Signed   By: Rogelia Myers M.D.   On: 07/06/2024 11:32    Cardiac Studies   Echo 05/2024  1. Left ventricular ejection fraction, by estimation, is 65 to 70%. The  left ventricle has normal function. The left ventricle has no regional  wall motion abnormalities. Left ventricular diastolic parameters are  indeterminate.   2. Right  ventricular systolic function is low normal. The right  ventricular size is mildly enlarged. There is moderately elevated  pulmonary artery systolic pressure. The estimated right ventricular  systolic pressure is 48.6 mmHg.   3. Left atrial size was mildly dilated.   4. Right atrial size was mildly dilated.   5. The mitral valve has been repaired/replaced. Mild to moderate mitral  valve regurgitation. Mild mitral stenosis. The mean mitral valve gradient  is 5.5 mmHg. There is a 28 mm Carpentier prosthetic annuloplasty ring  present in the mitral position.  Procedure Date: 10/10/2015.   6. Tricuspid valve regurgitation is severe.   7. The aortic valve is tricuspid. There is moderate calcification of the  aortic valve. Aortic valve regurgitation is trivial. No aortic stenosis is  present.   8. The inferior vena cava is normal in size with greater than 50%  respiratory variability, suggesting right atrial pressure of 3 mmHg.    Patient Profile     89 y.o. female  with a hx of chronic HFpEF, HTN, HLD, PAF, severe MR s/p mitral valve repair 2016 with mild-moderate MR, mild MS, severe TR, nonobstructive CAD, mild carotid artery disease (2016), hypothyroidism, esophageal spasm, arthritis who is being seen 07/06/2024 for the evaluation of Afib and CHF at the request of Dr. Claudene.   Assessment & Plan    Acute on chronic HFpEF:   Intake and output is incomplete.  At least net negative 1.5 liters.    Creat is back down today.  I increased the metoprolol  yesterday.  Can change to PO 20 mg daily with PRN 10 mg with weight gain or increased dyspnea at discharge. Family would be interested in home O2 if she qualifies.    Atrial fib:  Given the underlying lung process with abnormal CXR and other comorbid conditions as well as age and frailty, I am not going to suggest DCCV.  Amiodarone  stopped this admission with lung disease.  I up titrated the beta blocker.  She tolerated a total of 37.5 mg yesterday.  I  will try 25 mg tartrate bid.  Continue Eliquis .     Accelerated HTN:  BP trended down.   Resumed midodrine .      Nonobstructive CAD:  Continue risk reduction.    H/o MV repair with mild-moderate MR, mild MS, severe TR by echo:  No further imaging.  She is not an interventional candidate.    Hypothyroidism:  TSH 24.  Plan per primary team.  Follow up:  She has follow up in our clinic later this month.   For questions or updates, please contact [facility] Please consult [email or web address] for contact info under Cardiology/STEMI.   Signed, Lynwood Schilling, MD  07/08/2024, 7:53 AM

## 2024-07-09 DIAGNOSIS — I5033 Acute on chronic diastolic (congestive) heart failure: Secondary | ICD-10-CM | POA: Diagnosis not present

## 2024-07-09 MED ORDER — FUROSEMIDE 20 MG PO TABS: 20.0000 mg | ORAL_TABLET | Freq: Two times a day (BID) | ORAL | 1 refills | Status: DC

## 2024-07-09 MED ORDER — METOPROLOL TARTRATE 25 MG PO TABS: 25.0000 mg | ORAL_TABLET | Freq: Two times a day (BID) | ORAL | 1 refills | Status: DC

## 2024-07-09 MED ORDER — FUROSEMIDE 20 MG PO TABS: 20.0000 mg | ORAL_TABLET | Freq: Two times a day (BID) | ORAL | Status: DC

## 2024-07-09 NOTE — Plan of Care (Signed)
  Problem: Education: Goal: Knowledge of General Education information will improve Description: Including pain rating scale, medication(s)/side effects and non-pharmacologic comfort measures Outcome: Progressing   Problem: Health Behavior/Discharge Planning: Goal: Ability to manage health-related needs will improve Outcome: Progressing   Problem: Clinical Measurements: Goal: Will remain free from infection Outcome: Progressing   Problem: Clinical Measurements: Goal: Respiratory complications will improve Outcome: Progressing   

## 2024-07-09 NOTE — Discharge Summary (Signed)
 Physician Discharge Summary  Patient ID: Amy Fischer MRN: [id number] DOB/AGE: 01-Aug-1929 89 y.o.  Admit date: 07/06/2024 Discharge date: 07/09/2024  Admission Diagnoses:  Discharge Diagnoses:  Principal Problem:   Acute on chronic diastolic CHF (congestive heart failure) (HCC) Active Problems:   Hypothyroidism   Hyperlipidemia   Essential hypertension   CAD (coronary artery disease)   Paroxysmal atrial flutter (HCC)   AKI (acute kidney injury) (HCC)   Discharged Condition: {condition:18240}  Hospital Course: ***  Consults: {consultation:18241}  Significant Diagnostic Studies: {diagnostics:18242}  Treatments: {Tx:18249}  Discharge Exam: Blood pressure 102/76, pulse 87, temperature (!) 97.5 F (36.4 C), temperature source Oral, resp. rate 19, height 5' 1 (1.549 m), weight 42.7 kg, SpO2 95%. {physical zkjf:6958869}  Disposition: Discharge disposition: 01-Home or Self Care       Discharge Instructions     Diet - low sodium heart healthy   Complete by: As directed    Increase activity slowly   Complete by: As directed       Allergies as of 07/09/2024       Reactions   Actonel [risedronate Sodium] Other (See Comments)   Upset stomach OK to take Fosamax    Codeine Nausea And Vomiting   Statins Other (See Comments)   Myalgias  OK to take Crestor  2.5mg  QD.   Augmentin  [amoxicillin -pot Clavulanate] Diarrhea   Tape Other (See Comments)   Skin blisters OK to use paper tape        Medication List     STOP taking these medications    amiodarone  200 MG tablet Commonly known as: PACERONE    ipratropium 0.06 % nasal spray Commonly known as: ATROVENT    metoprolol  succinate 25 MG 24 hr tablet Commonly known as: TOPROL -XL   SALINE NASAL SPRAY NA       TAKE these medications    apixaban  2.5 MG Tabs tablet Commonly known as: Eliquis  Take 1 tablet (2.5 mg total) by mouth 2 (two) times daily.   ezetimibe  10 MG tablet Commonly known as: ZETIA  TAKE  1 TABLET DAILY   furosemide  20 MG tablet Commonly known as: LASIX  Take 1 tablet (20 mg total) by mouth 2 (two) times daily. What changed:  when to take this additional instructions   levothyroxine  100 MCG tablet Commonly known as: SYNTHROID  TAKE 1 TABLET EVERY MORNING ON AN EMPTY STOMACH   metoprolol  tartrate 25 MG tablet Commonly known as: LOPRESSOR  Take 1 tablet (25 mg total) by mouth 2 (two) times daily.   midodrine  5 MG tablet Commonly known as: PROAMATINE  Take 1 tablet (5 mg total) by mouth 3 (three) times daily with meals.   rOPINIRole  0.25 MG tablet Commonly known as: REQUIP  Take 1 tablet (0.25 mg total) by mouth at bedtime. For restless leg   rosuvastatin  5 MG tablet Commonly known as: CRESTOR  Take 0.5 tablets (2.5 mg total) by mouth daily.   SYSTANE OP Place 1 drop into both eyes 2 (two) times daily as needed (dry eye).   VITAMIN B-12 PO Take 1 tablet by mouth daily.   VITAMIN D -3 PO Take 1 capsule by mouth daily.         SignedBETHA Leatrice LILLETTE Rosario 07/09/2024, 12:48 PM

## 2024-07-09 NOTE — Progress Notes (Signed)
 Progress Note  Patient Name: Amy Fischer Date of Encounter: 07/09/2024  Primary Cardiologist:   Redell Shallow, MD   Subjective  Breathing is OK   NO CP   Inpatient Medications    Scheduled Meds:  apixaban   2.5 mg Oral BID   ezetimibe   10 mg Oral Daily   furosemide   20 mg Intravenous BID   levothyroxine   100 mcg Oral Q0600   metoprolol  tartrate  25 mg Oral BID   midodrine   5 mg Oral TID WC   rOPINIRole   0.25 mg Oral QHS   rosuvastatin   2.5 mg Oral Daily   sodium chloride  flush  3 mL Intravenous Q12H   Continuous Infusions:  PRN Meds: acetaminophen  **OR** acetaminophen , albuterol , Polyethyl Glycol-Propyl Glycol   Vital Signs    Vitals:   07/08/24 2133 07/08/24 2333 07/09/24 0354 07/09/24 0544  BP: 106/70 124/69 101/73   Pulse: (!) 105 (!) 105 (!) 105   Resp:  20 17   Temp: 98 F (36.7 C) (!) 97.4 F (36.3 C) 98 F (36.7 C)   TempSrc: Oral Oral Oral   SpO2:  92% 93%   Weight:    42.7 kg  Height:        Intake/Output Summary (Last 24 hours) at 07/09/2024 9361 Last data filed at 07/08/2024 2333 Gross per 24 hour  Intake 420 ml  Output 400 ml  Net 20 ml   Filed Weights   07/07/24 0456 07/08/24 0417 07/09/24 0544  Weight: 43.7 kg 43.5 kg 42.7 kg    Telemetry    Atrial flutter  Rates 80s to 110s     - Personally Reviewed  ECG    NA  - Personally Reviewed  Physical Exam   GEN: No  acute distress.  Very thin   Neck: No  JVD Cardiac: Irreg irreg  Respiratory:     Decreased breath sounds. GI: Soft, nontender, non-distended MS:  No edema; No deformity.   Labs    Chemistry Recent Labs  Lab 07/06/24 1051 07/07/24 0236 07/08/24 0318  NA 142 141 138  K 4.1 3.8 3.6  CL 106 105 101  CO2 23 27 28   GLUCOSE 117* 99 96  BUN 14 18 22   CREATININE 1.07* 1.38* 1.17*  CALCIUM  10.0 9.6 9.2  PROT 7.1  --   --   ALBUMIN  3.4*  --   --   AST 24  --   --   ALT 15  --   --   ALKPHOS 59  --   --   BILITOT 0.7  --   --   GFRNONAA 48* 36* 44*   ANIONGAP 13 9 9      Hematology Recent Labs  Lab 07/06/24 1051 07/07/24 0236  WBC 7.5 5.9  RBC 4.03 4.15  HGB 13.3 13.5  HCT 42.4 41.5  MCV 105.2* 100.0  MCH 33.0 32.5  MCHC 31.4 32.5  RDW 14.6 14.4  PLT 169 160    Cardiac EnzymesNo results for input(s): TROPONINI in the last 168 hours. No results for input(s): TROPIPOC in the last 168 hours.   BNP Recent Labs  Lab 07/06/24 1051  BNP 519.3*     DDimer No results for input(s): DDIMER in the last 168 hours.   Radiology    No results found.   Cardiac Studies   Echo 05/2024  1. Left ventricular ejection fraction, by estimation, is 65 to 70%. The  left ventricle has normal function. The left ventricle has no regional  wall motion abnormalities. Left ventricular diastolic parameters are  indeterminate.   2. Right ventricular systolic function is low normal. The right  ventricular size is mildly enlarged. There is moderately elevated  pulmonary artery systolic pressure. The estimated right ventricular  systolic pressure is 48.6 mmHg.   3. Left atrial size was mildly dilated.   4. Right atrial size was mildly dilated.   5. The mitral valve has been repaired/replaced. Mild to moderate mitral  valve regurgitation. Mild mitral stenosis. The mean mitral valve gradient  is 5.5 mmHg. There is a 28 mm Carpentier prosthetic annuloplasty ring  present in the mitral position.  Procedure Date: 10/10/2015.   6. Tricuspid valve regurgitation is severe.   7. The aortic valve is tricuspid. There is moderate calcification of the  aortic valve. Aortic valve regurgitation is trivial. No aortic stenosis is  present.   8. The inferior vena cava is normal in size with greater than 50%  respiratory variability, suggesting right atrial pressure of 3 mmHg.    Patient Profile     89 y.o. female  with a hx of chronic HFpEF, HTN, HLD, PAF, severe MR s/p mitral valve repair 2016 with mild-moderate MR, mild MS, severe TR,  nonobstructive CAD, mild carotid artery disease (2016), hypothyroidism, esophageal spasm, arthritis who is being seen 07/06/2024 for the evaluation of Afib and CHF at the request of Dr. Claudene.   Assessment & Plan    Acute on chronic HFpEF:  Pt has diuresed since admit   Volume status is OK     I would keep on daily po lasix    Atrial flutter   Plan for rate control  ON b blocker   Keep on ELiquis         Accelerated HTN:PT acturally on midodrine    FOllow  Taper as tolerates      Nonobstructive CAD:  Continue risk reduction.    MItral Valve Dz   s/p repair   Now with mild / mod MR and mild MS      TR   Severe    Not a candidate for intervention    Will need to watch fluids      Hypothyroidism:  TSH 24.  Plan per primary team.     Follow up:  Will make sure she has follow up in clinc   PT OK to d/c from cardiac standpoint   For questions or updates, please contact [facility] Please consult [email or web address] for contact info under Cardiology/STEMI.   Signed, Vina Gull, MD  07/09/2024, 6:38 AM

## 2024-07-09 NOTE — Progress Notes (Signed)
 Mobility Specialist Progress Note:   07/09/24 0910  Mobility  Activity Ambulated with assistance in hallway  Level of Assistance Standby assist, set-up cues, supervision of patient - no hands on  Assistive Device Front wheel walker  Distance Ambulated (ft) 400 ft  Activity Response Tolerated well  Mobility Referral Yes  Mobility visit 1 Mobility  Mobility Specialist Start Time (ACUTE ONLY) 0910  Mobility Specialist Stop Time (ACUTE ONLY) [id number]  Mobility Specialist Time Calculation (min) (ACUTE ONLY) 13 min   Pt agreeable to mobility session. Required no physical assistance throughout. Pt eager for d/c. Back in bed with all needs met.   Therisa Rana Mobility Specialist Please contact via SecureChat or  Rehab office at [phone]

## 2024-07-11 ENCOUNTER — Telehealth: Payer: Self-pay

## 2024-07-11 ENCOUNTER — Other Ambulatory Visit: Payer: Self-pay | Admitting: Internal Medicine

## 2024-07-11 NOTE — Transitions of Care (Post Inpatient/ED Visit) (Signed)
 Today's TOC FU Call Status: Today's TOC FU Call Status:: Successful TOC FU Call Completed TOC FU Call Complete Date: 07/11/24 Patient's Name and Date of Birth confirmed.  Transition Care Management Follow-up Telephone Call Date of Discharge: 07/09/24 Discharge Facility: [facility]) Type of Discharge: Inpatient Admission Primary Inpatient Discharge Diagnosis:: Acute on chronic diastolic CHF How have you been since you were released from the hospital?: Better Any questions or concerns?: No  Items Reviewed: Did you receive and understand the discharge instructions provided?: Yes (Daugher RN assist with medication planning, pill box, adherence issues.) Any new allergies since your discharge?: No Type of Diet Ordered:: Low sodium heart healthy Do you have support at home?: Yes Name of Support/Comfort Primary Source: [employer] (Daughter)  [phone] (Mobile)  Medications Reviewed Today: Medications Reviewed Today     Reviewed by Carolee Heron NOVAK, RN (Case Manager) on 07/11/24 at 1150  Med List Status: <None>   Medication Order Taking? Sig Documenting Provider Last Dose Status Informant  apixaban  (ELIQUIS ) 2.5 MG TABS tablet [id number] Yes Take 1 tablet (2.5 mg total) by mouth 2 (two) times daily. Trudy Birmingham, PA-C  Active Self, Pharmacy Records  Cholecalciferol  (VITAMIN D -3 PO) [id number] Yes Take 1 capsule by mouth daily. [provider]  Active Self, Pharmacy Records  Cyanocobalamin  (VITAMIN B-12 PO) [id number] Yes Take 1 tablet by mouth daily. [provider]  Active Self, Pharmacy Records  ezetimibe  (ZETIA ) 10 MG tablet [id number] Yes TAKE 1 TABLET DAILY Panosh, Wanda K, MD  Active Self, Pharmacy Records  furosemide  (LASIX ) 20 MG tablet [id number] Yes Take 1 tablet (20 mg total) by mouth 2 (two) times daily. Rosario Eland I, MD  Active   levothyroxine  (SYNTHROID ) 100 MCG tablet [id number] Yes TAKE 1 TABLET EVERY MORNING ON AN EMPTY STOMACH Webb, Padonda B, FNP   Active Self, Pharmacy Records  metoprolol  tartrate (LOPRESSOR ) 25 MG tablet [id number] Yes Take 1 tablet (25 mg total) by mouth 2 (two) times daily. Rosario Eland FERNS, MD  Active   midodrine  (PROAMATINE ) 5 MG tablet [id number] Yes Take 1 tablet (5 mg total) by mouth 3 (three) times daily with meals. Trudy Birmingham, PA-C  Active Self, Pharmacy Records  Polyethyl Glycol-Propyl Glycol [facility] OP) [id number] Yes Place 1 drop into both eyes 2 (two) times daily as needed (dry eye). [provider]  Active Self, Pharmacy Records  rOPINIRole  (REQUIP ) 0.25 MG tablet [id number] Yes Take 1 tablet (0.25 mg total) by mouth at bedtime. For restless leg Panosh, Wanda K, MD  Active Self, Pharmacy Records  rosuvastatin  (CRESTOR ) 5 MG tablet [id number] Yes Take 0.5 tablets (2.5 mg total) by mouth daily. Panosh, [name], MD  Active Self, Pharmacy Records            Home Care and Equipment/Supplies: Were Home Health Services Ordered?: Yes (Has private physical therapy comes twice a week, Tues and Thursdays.) Name of Home Health Agency:: [facility] Has Agency set up a time to come to your home?: Yes First Home Health Visit Date: 07/12/24 Any new equipment or medical supplies ordered?: No  Functional Questionnaire: Do you need assistance with bathing/showering or dressing?: No Do you need assistance with meal preparation?: No (Daughter brings home cooked meals, So eating very well.) Do you need assistance with eating?: No Do you have difficulty maintaining continence: No Do you need assistance with getting out of bed/getting out of a chair/moving?: No Do you have difficulty managing or taking your medications?: No (Daugher assists with this.)  Follow  up appointments reviewed: PCP Follow-up appointment confirmed?: No (Patient has a question she wants to ask so will call today, and has contact information.) MD Provider Line Number:[phone] Given: No Specialist Hospital  Follow-up appointment confirmed?: No (Cardiologist [phone]) Follow-Up Specialty Provider:: Cardiology Artist Reding (Cardiology Artist Reding seen last on 06/27/24) Reason Specialist Follow-Up Not Confirmed: Patient has Specialist Provider Number and will Call for Appointment Do you need transportation to your follow-up appointment?: No (Still driving and/or daughter available.) Do you understand care options if your condition(s) worsen?: Yes-patient verbalized understanding (gain too much weight and take blood pressure daily. reviewed instructions on AVS.)  SDOH Interventions Today    Flowsheet Row Most Recent Value  SDOH Interventions   Food Insecurity Interventions Intervention Not Indicated  Housing Interventions Intervention Not Indicated  Transportation Interventions Patient Resources (Friends/Family)  Utilities Interventions Intervention Not Indicated  Social Connections Interventions Intervention Not Indicated  Health Literacy Interventions Intervention Not Indicated    Goals Addressed             This Visit's Progress    VBCI Transitions of Care (TOC) Care Plan       Problems:  Recent Hospitalization for treatment of CHF Knowledge Deficit Related to Congestive Heart Failure, No Hospital Follow Up Provider appointment yet, (discharged over the weekend) and patient to call right after this call, and No Specialist appointment pending outreach by cardiologist, but patient has contact information and will contact office for follow up appointment.   Goal:  Over the next 30 days, the patient will not experience hospital readmission  Interventions:   Heart Failure Interventions: Basic overview and discussion of pathophysiology of Heart Failure reviewed;  Assessed need for readable accurate scales in home, which patient does have.  Provided education about placing scale on hard, flat surface Advised patient to weigh each morning after emptying bladder Discussed importance  of daily weight and advised patient to weigh and record daily Reviewed role of diuretics in prevention of fluid overload and management of heart failure;  and to monitor weight and blood pressure at home and record in log--monitors blood pressure now but will start recording with daily weights.  Discussed the importance of keeping all appointments with provider Discussed adhering to prescribed medication routine, reviewed medications on this discharge, including any changes, adherence issues.  Provided patient with education about the role of exercise and diet modifications in the management of heart failure: Continue with private physical therapy services Tuesday/Thursday weekly. Assessed social determinant of health barriers; none identified.  Teach back on importance of medication adherence, reporting of any side effects or concerns, daily weights, blood pressure monitoring, and what to call provider for on discharge instructions.  Patient advised on how to contact insurance benefits for any additional benefits that may be available or if need arises.  Discussed/reviewed care gaps--vaccinations to discuss with PCP.   Patient Self Care Activities:  Patient to call PCP today for HFU appointment and to let [facility] RNCM know if any issues obtaining an appointment within 14 days of discharge. (Patient wanted to call).  Attend all scheduled provider appointments Attend church or other social activities Call pharmacy for medication refills 3-7 days in advance of running out of medications Call provider office for new concerns or questions  Notify RN Care Manager of TOC call rescheduling needs Participate in Transition of Care Program/Attend TOC scheduled calls Perform all self care activities independently  Take medications as prescribed   Continue with physical therapy twice a week   Plan:  An initial telephone outreach has been scheduled for: Monday July 18, 2024 around 11 am by [facility] RN CM Bing Edison RN CM and direct number given to call if any issues scheduled PCP HFU appointment. Follow up with provider re: HFU appointment The patient will call cardiologist office, number given, as advised to to schedule HFU visit.  Discuss Heart Failure Self Check Action Plan (patient ran out of time).        07/11/24: TOC RN post discharge outreach: Patient successfully engaged and call was completed with verbal agreement for follow up calls in the 30 day program.  Patient wanted to call PCP on her own and declined Care Guide assistance while on the phone with [facility] RN CM, will also contact cardiologist for HFU as well.  Agreed to let RN CM know if any issues scheduling a PCP HFU visit within 14 days of discharge and RN CM direct number given to patient with additional information that a secure voice mail could be left if needed.  Plan to continue with education/knowledge deficits/HF Action plan next week on Monday 07/18/24 at 1100 am.    Bing Edison MSN, RN RN Case Manager [facility] : [facility]  VBCI-Population Health Office Hours M-F [phone] Direct Dial: [phone] Main Phone [phone]  Fax: [phone] [email or web address]

## 2024-07-11 NOTE — Telephone Encounter (Unsigned)
 Copied from CRM [id number]. Topic: Clinical - Medication Refill >> Jul 11, 2024 12:34 PM Suzen RAMAN wrote: Medication: Requip    Has the patient contacted their pharmacy? Yes  This is the patient's preferred pharmacy:  [facility] - [place], [place] - [address] [place] [address] Phone: [phone] Fax: [phone]  Is this the correct pharmacy for this prescription? Yes If no, delete pharmacy and type the correct one.   Has the prescription been filled recently? No  Is the patient out of the medication? Yes  Has the patient been seen for an appointment in the last year OR does the patient have an upcoming appointment? Yes  Can we respond through MyChart? Yes  Agent: Please be advised that Rx refills may take up to 3 business days. We ask that you follow-up with your pharmacy.

## 2024-07-11 NOTE — Patient Instructions (Signed)
 Visit Information  Thank you for taking time to visit with me today. Please don't hesitate to contact me if I can be of assistance to you before our next scheduled telephone appointment.  Our next appointment is by telephone on 07/18/24 at 1100 am  Following is a copy of your care plan:   Goals Addressed             This Visit's Progress    VBCI Transitions of Care (TOC) Care Plan       Problems:  Recent Hospitalization for treatment of CHF Knowledge Deficit Related to Congestive Heart Failure, No Hospital Follow Up Provider appointment yet, (discharged over the weekend) and patient to call right after this call, and No Specialist appointment pending outreach by cardiologist, but patient has contact information and will contact office for follow up appointment.   Goal:  Over the next 30 days, the patient will not experience hospital readmission  Interventions:   Heart Failure Interventions: Basic overview and discussion of pathophysiology of Heart Failure reviewed;  Assessed need for readable accurate scales in home, which patient does have.  Provided education about placing scale on hard, flat surface Advised patient to weigh each morning after emptying bladder Discussed importance of daily weight and advised patient to weigh and record daily Reviewed role of diuretics in prevention of fluid overload and management of heart failure;  and to monitor weight and blood pressure at home and record in log--monitors blood pressure now but will start recording with daily weights.  Discussed the importance of keeping all appointments with provider Discussed adhering to prescribed medication routine, reviewed medications on this discharge, including any changes, adherence issues.  Provided patient with education about the role of exercise and diet modifications in the management of heart failure: Continue with private physical therapy services Tuesday/Thursday weekly. Assessed social  determinant of health barriers; none identified.  Teach back on importance of medication adherence, reporting of any side effects or concerns, daily weights, blood pressure monitoring, and what to call provider for on discharge instructions.  Patient advised on how to contact insurance benefits for any additional benefits that may be available or if need arises.  Discussed/reviewed care gaps--vaccinations to discuss with PCP.   Patient Self Care Activities:  Patient to call PCP today for HFU appointment and to let [facility] RNCM know if any issues obtaining an appointment within 14 days of discharge. (Patient wanted to call).  Attend all scheduled provider appointments Attend church or other social activities Call pharmacy for medication refills 3-7 days in advance of running out of medications Call provider office for new concerns or questions  Notify RN Care Manager of TOC call rescheduling needs Participate in Transition of Care Program/Attend TOC scheduled calls Perform all self care activities independently  Take medications as prescribed   Continue with physical therapy twice a week   Plan:  An initial telephone outreach has been scheduled for: Monday July 18, 2024 around 11 am by [facility] RN CM Bing Edison RN CM and direct number given to call if any issues scheduled PCP HFU appointment. Follow up with provider re: HFU appointment The patient will call cardiologist office, number given, as advised to to schedule HFU visit.  Discuss Heart Failure Self Check Action Plan (patient ran out of time).         Patient verbalizes understanding of instructions and care plan provided today and agrees to view in MyChart. Active MyChart status and patient understanding of how to access instructions and  care plan via MyChart confirmed with patient.     Telephone follow up appointment with care management team member scheduled for: Follow up with provider re: Hospital follow up appointment  Please call  the care guide team at [phone] if you need to cancel or reschedule your appointment.   Please call the Suicide and Crisis Lifeline: 988 call 1-800-273-TALK (toll free, 24 hour hotline) if you are experiencing a Mental Health or Behavioral Health Crisis or need someone to talk to.    Bing Edison MSN RN CM  RN Care Manager [place]  Transitions of Care Direct Dial: [phone] (Weekends Only) [facility] Main Office Phone: [phone] [facility] Fax: [phone] [email or web address]

## 2024-07-12 DIAGNOSIS — M1990 Unspecified osteoarthritis, unspecified site: Secondary | ICD-10-CM | POA: Diagnosis not present

## 2024-07-12 DIAGNOSIS — R278 Other lack of coordination: Secondary | ICD-10-CM | POA: Diagnosis not present

## 2024-07-12 DIAGNOSIS — G629 Polyneuropathy, unspecified: Secondary | ICD-10-CM | POA: Diagnosis not present

## 2024-07-12 DIAGNOSIS — Z9181 History of falling: Secondary | ICD-10-CM | POA: Diagnosis not present

## 2024-07-12 MED ORDER — ROPINIROLE HCL 0.25 MG PO TABS: 0.2500 mg | ORAL_TABLET | Freq: Every day | ORAL | 0 refills | Status: DC

## 2024-07-14 ENCOUNTER — Emergency Department (HOSPITAL_COMMUNITY)

## 2024-07-14 ENCOUNTER — Emergency Department (HOSPITAL_COMMUNITY)
Admission: EM | Admit: 2024-07-14 | Discharge: 2024-07-14 | Disposition: A | Discharge status: Skilled Nursing Facility | Attending: Emergency Medicine | Admitting: Emergency Medicine

## 2024-07-14 ENCOUNTER — Other Ambulatory Visit: Payer: Self-pay

## 2024-07-14 ENCOUNTER — Encounter (HOSPITAL_COMMUNITY): Payer: Self-pay

## 2024-07-14 DIAGNOSIS — Z79899 Other long term (current) drug therapy: Secondary | ICD-10-CM | POA: Insufficient documentation

## 2024-07-14 DIAGNOSIS — I10 Essential (primary) hypertension: Secondary | ICD-10-CM | POA: Diagnosis not present

## 2024-07-14 DIAGNOSIS — M25519 Pain in unspecified shoulder: Secondary | ICD-10-CM | POA: Diagnosis not present

## 2024-07-14 DIAGNOSIS — R42 Dizziness and giddiness: Secondary | ICD-10-CM | POA: Diagnosis not present

## 2024-07-14 DIAGNOSIS — M25511 Pain in right shoulder: Secondary | ICD-10-CM | POA: Diagnosis present

## 2024-07-14 DIAGNOSIS — R0603 Acute respiratory distress: Secondary | ICD-10-CM | POA: Diagnosis not present

## 2024-07-14 DIAGNOSIS — S199XXA Unspecified injury of neck, initial encounter: Secondary | ICD-10-CM | POA: Diagnosis not present

## 2024-07-14 DIAGNOSIS — S0990XA Unspecified injury of head, initial encounter: Secondary | ICD-10-CM | POA: Insufficient documentation

## 2024-07-14 DIAGNOSIS — I509 Heart failure, unspecified: Secondary | ICD-10-CM | POA: Diagnosis not present

## 2024-07-14 DIAGNOSIS — S42301A Unspecified fracture of shaft of humerus, right arm, initial encounter for closed fracture: Secondary | ICD-10-CM | POA: Diagnosis not present

## 2024-07-14 DIAGNOSIS — G2581 Restless legs syndrome: Secondary | ICD-10-CM | POA: Diagnosis not present

## 2024-07-14 DIAGNOSIS — M6281 Muscle weakness (generalized): Secondary | ICD-10-CM | POA: Diagnosis not present

## 2024-07-14 DIAGNOSIS — M1990 Unspecified osteoarthritis, unspecified site: Secondary | ICD-10-CM | POA: Diagnosis not present

## 2024-07-14 DIAGNOSIS — F349 Persistent mood [affective] disorder, unspecified: Secondary | ICD-10-CM | POA: Diagnosis not present

## 2024-07-14 DIAGNOSIS — S42221D 2-part displaced fracture of surgical neck of right humerus, subsequent encounter for fracture with routine healing: Secondary | ICD-10-CM | POA: Diagnosis not present

## 2024-07-14 DIAGNOSIS — R000 Tachycardia, unspecified: Secondary | ICD-10-CM | POA: Diagnosis not present

## 2024-07-14 DIAGNOSIS — Y92009 Unspecified place in unspecified non-institutional (private) residence as the place of occurrence of the external cause: Secondary | ICD-10-CM | POA: Diagnosis not present

## 2024-07-14 DIAGNOSIS — W19XXXA Unspecified fall, initial encounter: Secondary | ICD-10-CM | POA: Diagnosis not present

## 2024-07-14 DIAGNOSIS — I480 Paroxysmal atrial fibrillation: Secondary | ICD-10-CM | POA: Diagnosis not present

## 2024-07-14 DIAGNOSIS — R0602 Shortness of breath: Secondary | ICD-10-CM | POA: Diagnosis not present

## 2024-07-14 DIAGNOSIS — G459 Transient cerebral ischemic attack, unspecified: Secondary | ICD-10-CM | POA: Diagnosis not present

## 2024-07-14 DIAGNOSIS — R278 Other lack of coordination: Secondary | ICD-10-CM | POA: Diagnosis not present

## 2024-07-14 DIAGNOSIS — E785 Hyperlipidemia, unspecified: Secondary | ICD-10-CM | POA: Diagnosis not present

## 2024-07-14 DIAGNOSIS — I071 Rheumatic tricuspid insufficiency: Secondary | ICD-10-CM | POA: Diagnosis not present

## 2024-07-14 DIAGNOSIS — I5033 Acute on chronic diastolic (congestive) heart failure: Secondary | ICD-10-CM | POA: Diagnosis not present

## 2024-07-14 DIAGNOSIS — Z681 Body mass index (BMI) 19 or less, adult: Secondary | ICD-10-CM | POA: Diagnosis not present

## 2024-07-14 DIAGNOSIS — I5032 Chronic diastolic (congestive) heart failure: Secondary | ICD-10-CM | POA: Diagnosis not present

## 2024-07-14 DIAGNOSIS — R0789 Other chest pain: Secondary | ICD-10-CM | POA: Diagnosis not present

## 2024-07-14 DIAGNOSIS — I779 Disorder of arteries and arterioles, unspecified: Secondary | ICD-10-CM | POA: Diagnosis not present

## 2024-07-14 DIAGNOSIS — I959 Hypotension, unspecified: Secondary | ICD-10-CM | POA: Diagnosis not present

## 2024-07-14 DIAGNOSIS — E039 Hypothyroidism, unspecified: Secondary | ICD-10-CM | POA: Diagnosis not present

## 2024-07-14 DIAGNOSIS — K219 Gastro-esophageal reflux disease without esophagitis: Secondary | ICD-10-CM | POA: Diagnosis not present

## 2024-07-14 DIAGNOSIS — W1830XA Fall on same level, unspecified, initial encounter: Secondary | ICD-10-CM | POA: Insufficient documentation

## 2024-07-14 DIAGNOSIS — J449 Chronic obstructive pulmonary disease, unspecified: Secondary | ICD-10-CM | POA: Diagnosis not present

## 2024-07-14 DIAGNOSIS — M2550 Pain in unspecified joint: Secondary | ICD-10-CM | POA: Diagnosis not present

## 2024-07-14 DIAGNOSIS — I340 Nonrheumatic mitral (valve) insufficiency: Secondary | ICD-10-CM | POA: Diagnosis not present

## 2024-07-14 DIAGNOSIS — Z7901 Long term (current) use of anticoagulants: Secondary | ICD-10-CM | POA: Insufficient documentation

## 2024-07-14 DIAGNOSIS — G458 Other transient cerebral ischemic attacks and related syndromes: Secondary | ICD-10-CM | POA: Diagnosis not present

## 2024-07-14 DIAGNOSIS — Z743 Need for continuous supervision: Secondary | ICD-10-CM | POA: Diagnosis not present

## 2024-07-14 DIAGNOSIS — R2689 Other abnormalities of gait and mobility: Secondary | ICD-10-CM | POA: Diagnosis not present

## 2024-07-14 DIAGNOSIS — Z9889 Other specified postprocedural states: Secondary | ICD-10-CM | POA: Diagnosis not present

## 2024-07-14 DIAGNOSIS — S42201A Unspecified fracture of upper end of right humerus, initial encounter for closed fracture: Secondary | ICD-10-CM | POA: Diagnosis not present

## 2024-07-14 DIAGNOSIS — J439 Emphysema, unspecified: Secondary | ICD-10-CM | POA: Diagnosis not present

## 2024-07-14 DIAGNOSIS — I110 Hypertensive heart disease with heart failure: Secondary | ICD-10-CM | POA: Insufficient documentation

## 2024-07-14 DIAGNOSIS — I5030 Unspecified diastolic (congestive) heart failure: Secondary | ICD-10-CM | POA: Diagnosis not present

## 2024-07-14 DIAGNOSIS — S42291A Other displaced fracture of upper end of right humerus, initial encounter for closed fracture: Secondary | ICD-10-CM

## 2024-07-14 DIAGNOSIS — G4700 Insomnia, unspecified: Secondary | ICD-10-CM | POA: Diagnosis not present

## 2024-07-14 DIAGNOSIS — I2510 Atherosclerotic heart disease of native coronary artery without angina pectoris: Secondary | ICD-10-CM | POA: Diagnosis not present

## 2024-07-14 DIAGNOSIS — R0689 Other abnormalities of breathing: Secondary | ICD-10-CM | POA: Diagnosis not present

## 2024-07-14 DIAGNOSIS — S42211A Unspecified displaced fracture of surgical neck of right humerus, initial encounter for closed fracture: Secondary | ICD-10-CM | POA: Diagnosis not present

## 2024-07-14 DIAGNOSIS — K5900 Constipation, unspecified: Secondary | ICD-10-CM | POA: Diagnosis not present

## 2024-07-14 DIAGNOSIS — F331 Major depressive disorder, recurrent, moderate: Secondary | ICD-10-CM | POA: Diagnosis not present

## 2024-07-14 DIAGNOSIS — S42221A 2-part displaced fracture of surgical neck of right humerus, initial encounter for closed fracture: Secondary | ICD-10-CM | POA: Diagnosis not present

## 2024-07-14 MED ORDER — FUROSEMIDE 20 MG PO TABS
20.0000 mg | ORAL_TABLET | Freq: Two times a day (BID) | ORAL | Status: DC
  Administered 2024-07-14: 20 mg via ORAL
  Filled 2024-07-14: qty 1

## 2024-07-14 MED ORDER — ACETAMINOPHEN 500 MG PO TABS
1000.0000 mg | ORAL_TABLET | Freq: Once | ORAL | Status: AC
  Administered 2024-07-14: 1000 mg via ORAL
  Filled 2024-07-14: qty 2

## 2024-07-14 MED ORDER — ROPINIROLE HCL 0.25 MG PO TABS
0.2500 mg | ORAL_TABLET | Freq: Every day | ORAL | Status: DC
  Filled 2024-07-14: qty 1

## 2024-07-14 MED ORDER — VITAMIN D 25 MCG (1000 UNIT) PO TABS
1000.0000 [IU] | ORAL_TABLET | Freq: Every day | ORAL | Status: DC
  Administered 2024-07-14: 1000 [IU] via ORAL
  Filled 2024-07-14: qty 1

## 2024-07-14 MED ORDER — VITAMIN B-12 1000 MCG PO TABS
1000.0000 ug | ORAL_TABLET | Freq: Every day | ORAL | Status: DC
  Administered 2024-07-14: 1000 ug via ORAL
  Filled 2024-07-14: qty 1

## 2024-07-14 MED ORDER — LEVOTHYROXINE SODIUM 100 MCG PO TABS
100.0000 ug | ORAL_TABLET | Freq: Every day | ORAL | Status: DC
  Administered 2024-07-14: 100 ug via ORAL
  Filled 2024-07-14: qty 1

## 2024-07-14 MED ORDER — APIXABAN 2.5 MG PO TABS
2.5000 mg | ORAL_TABLET | Freq: Two times a day (BID) | ORAL | Status: DC
  Administered 2024-07-14: 2.5 mg via ORAL
  Filled 2024-07-14: qty 1

## 2024-07-14 MED ORDER — ONDANSETRON 4 MG PO TBDP
4.0000 mg | ORAL_TABLET | Freq: Once | ORAL | Status: AC
  Administered 2024-07-14: 4 mg via ORAL
  Filled 2024-07-14: qty 1

## 2024-07-14 MED ORDER — EZETIMIBE 10 MG PO TABS
10.0000 mg | ORAL_TABLET | Freq: Every day | ORAL | Status: DC
  Administered 2024-07-14: 10 mg via ORAL
  Filled 2024-07-14 (×2): qty 1

## 2024-07-14 MED ORDER — ROSUVASTATIN CALCIUM 5 MG PO TABS
2.5000 mg | ORAL_TABLET | Freq: Every day | ORAL | Status: DC
  Administered 2024-07-14: 2.5 mg via ORAL
  Filled 2024-07-14: qty 1

## 2024-07-14 MED ORDER — OXYCODONE-ACETAMINOPHEN 5-325 MG PO TABS
1.0000 | ORAL_TABLET | ORAL | Status: DC | PRN
  Administered 2024-07-14: 1 via ORAL
  Filled 2024-07-14: qty 1

## 2024-07-14 MED ORDER — METOPROLOL TARTRATE 25 MG PO TABS
25.0000 mg | ORAL_TABLET | Freq: Two times a day (BID) | ORAL | Status: DC
Start: 2024-07-14 — End: 2024-07-14
  Administered 2024-07-14: 25 mg via ORAL
  Filled 2024-07-14: qty 1

## 2024-07-14 MED ORDER — MIDODRINE HCL 5 MG PO TABS
5.0000 mg | ORAL_TABLET | Freq: Three times a day (TID) | ORAL | Status: DC
  Administered 2024-07-14 (×2): 5 mg via ORAL
  Filled 2024-07-14 (×2): qty 1

## 2024-07-14 NOTE — ED Triage Notes (Addendum)
 PT BIB [facility] from home. Pt had a fall, about an hour ago and she fell on her right side. Pt did not hit her head, on blood thinners (eliquis ). Did not hit her head. Pt endorses right shoulder pain. She reports she felt a pop. CNS intact but unable to form a fist. Hx CHF and COPD  [facility] 138/82 87P 28R 92%RA 121 CBG

## 2024-07-14 NOTE — Discharge Instructions (Signed)
 Apply ice for 30 minutes at a time, 4 times a day.  Wear the sling at all times until cleared to remove it by the orthopedic physician.

## 2024-07-14 NOTE — ED Provider Notes (Signed)
 Emergency Medicine Observation Re-evaluation Note  Amy Fischer is a 89 y.o. female, seen on rounds today.  Pt initially presented to the ED for complaints of Fall Currently, the patient is resting comfortably.  Physical Exam  BP 110/88   Pulse 89   Temp 98 F (36.7 C) (Oral)   Resp 16   Ht 5' 1 (1.549 m)   Wt 43 kg   SpO2 96%   BMI 17.91 kg/m  Physical Exam Vitals and nursing note reviewed.  Constitutional:      General: She is not in acute distress.    Appearance: She is well-developed.  HENT:     Head: Normocephalic and atraumatic.  Eyes:     Conjunctiva/sclera: Conjunctivae normal.  Pulmonary:     Effort: Pulmonary effort is normal. No respiratory distress.  Musculoskeletal:        General: No swelling.     Cervical back: Neck supple.  Skin:    General: Skin is warm and dry.     Capillary Refill: Capillary refill takes less than 2 seconds.  Neurological:     Mental Status: She is alert.  Psychiatric:        Mood and Affect: Mood normal.     ED Course / MDM  EKG:   I have reviewed the labs performed to date as well as medications administered while in observation.  Recent changes in the last 24 hours include social work evaluation with plans to discharge to [facility].  Plan  Current plan is for discharge.    Amy Dixon, MD 07/14/24 1058

## 2024-07-14 NOTE — ED Provider Notes (Signed)
 [place] EMERGENCY DEPARTMENT AT [facility] Provider Note   CSN: [id number] Arrival date & time: 07/14/24  9861     Patient presents with: No chief complaint on file.   Amy Fischer is a 89 y.o. female.   The history is provided by the patient.  She has history of hypertension, hyperlipidemia, heart failure with preserved ejection fraction, atrial fibrillation anticoagulated on apixaban  and comes in following a fall at home.  She cannot recall mechanism of injury.  She is complaining of pain in her right shoulder.  She denies head or neck injury.   Prior to Admission medications   Medication Sig Start Date End Date Taking? Authorizing Provider  apixaban  (ELIQUIS ) 2.5 MG TABS tablet Take 1 tablet (2.5 mg total) by mouth 2 (two) times daily. 06/27/24   Williams, Evan, PA-C  Cholecalciferol  (VITAMIN D -3 PO) Take 1 capsule by mouth daily.    [provider]  Cyanocobalamin  (VITAMIN B-12 PO) Take 1 tablet by mouth daily.    [provider]  ezetimibe  (ZETIA ) 10 MG tablet TAKE 1 TABLET DAILY 08/04/23   Panosh, Wanda K, MD  furosemide  (LASIX ) 20 MG tablet Take 1 tablet (20 mg total) by mouth 2 (two) times daily. 07/09/24   Rosario Eland I, MD  levothyroxine  (SYNTHROID ) 100 MCG tablet TAKE 1 TABLET EVERY MORNING ON AN EMPTY STOMACH 10/14/23   Webb, Padonda B, FNP  metoprolol  tartrate (LOPRESSOR ) 25 MG tablet Take 1 tablet (25 mg total) by mouth 2 (two) times daily. 07/09/24   Rosario Eland FERNS, MD  midodrine  (PROAMATINE ) 5 MG tablet Take 1 tablet (5 mg total) by mouth 3 (three) times daily with meals. 06/27/24 07/27/24  Williams, Evan, PA-C  Polyethyl Glycol-Propyl Glycol (SYSTANE OP) Place 1 drop into both eyes 2 (two) times daily as needed (dry eye).    [provider]  rOPINIRole  (REQUIP ) 0.25 MG tablet Take 1 tablet (0.25 mg total) by mouth at bedtime. For restless leg 07/12/24 08/11/24  Panosh, Wanda K, MD  rosuvastatin  (CRESTOR ) 5 MG tablet Take 0.5  tablets (2.5 mg total) by mouth daily. 08/03/23   Panosh, Wanda K, MD    Allergies: Actonel [risedronate sodium], Codeine, Statins, Augmentin  [amoxicillin -pot clavulanate], and Tape    Review of Systems  All other systems reviewed and are negative.   Updated Vital Signs There were no vitals taken for this visit.  Physical Exam Vitals and nursing note reviewed.   89 year old female, resting comfortably and in no acute distress. Vital signs are normal. Oxygen saturation is 96%, which is normal. Head is normocephalic and atraumatic. PERRLA, EOMI.  Neck is nontender. Back is nontender and there is no CVA tenderness. Lungs are clear without rales, wheezes, or rhonchi. Chest is nontender. Heart has regular rate and rhythm without murmur. Abdomen is soft, flat, nontender. Pelvis is stable and nontender. Extremities: There is swelling and tenderness in the right shoulder, pain with any passive range of motion.  Distal neurovascular exam is intact with strong pulses, prompt capillary refill, normal sensation.  There is full range of motion present of all other joints without pain. Skin is warm and dry without rash. Neurologic: Awake and alert and oriented, cranial nerves are intact, moves all extremities equally.   Radiology: DG Shoulder Right Portable Result Date: 07/14/2024 CLINICAL DATA:  Recent fall with right shoulder pain, initial encounter EXAM: RIGHT SHOULDER - 3VIEW COMPARISON:  None Available. FINDINGS: There is a fracture in the proximal right humerus involving primarily the  surgical neck. Humeral head is well seated in the glenoid without dislocation. No other focal abnormality is noted. IMPRESSION: Surgical neck fracture of the proximal right humerus. No dislocation is noted. Electronically Signed   By: Oneil Devonshire M.D.   On: 07/14/2024 02:47   CT Head Wo Contrast Result Date: 07/14/2024 CLINICAL DATA:  Head trauma, minor (Age >= 65y); Neck trauma (Age >= 65y) EXAM: CT HEAD  WITHOUT CONTRAST CT CERVICAL SPINE WITHOUT CONTRAST TECHNIQUE: Multidetector CT imaging of the head and cervical spine was performed following the standard protocol without intravenous contrast. Multiplanar CT image reconstructions of the cervical spine were also generated. RADIATION DOSE REDUCTION: This exam was performed according to the departmental dose-optimization program which includes automated exposure control, adjustment of the mA and/or kV according to patient size and/or use of iterative reconstruction technique. COMPARISON:  CT head and CT cervical spine November 13, 2023. FINDINGS: CT HEAD FINDINGS Brain: No evidence of acute infarction, hemorrhage, hydrocephalus, extra-axial collection or mass lesion/mass effect. Vascular: Calcific atherosclerosis. Skull: No acute fracture. Sinuses/Orbits: Clear sinuses.  No acute orbital findings. Other: No mastoid effusions. CT CERVICAL SPINE FINDINGS Alignment: Similar anterolisthesis of C7 on T1 and T1 on T2. No traumatic malalignment. Skull base and vertebrae: No evidence of acute fracture. Chronic C2-C3 congenital segmentation anomaly. Soft tissues and spinal canal: No prevertebral fluid or swelling. No visible canal hematoma. Disc levels: Similar degenerative change including degenerative disc disease and facet/uncovertebral hypertrophy which contributes to varying degrees of neural foraminal stenosis. Upper chest: Visualized lung apices are clear.  Emphysema. IMPRESSION: 1. No evidence of acute intracranial abnormality. 2. No evidence of acute fracture or traumatic malalignment in the cervical spine. Electronically Signed   By: Gilmore GORMAN Molt M.D.   On: 07/14/2024 02:39   CT Cervical Spine Wo Contrast Result Date: 07/14/2024 CLINICAL DATA:  Head trauma, minor (Age >= 65y); Neck trauma (Age >= 65y) EXAM: CT HEAD WITHOUT CONTRAST CT CERVICAL SPINE WITHOUT CONTRAST TECHNIQUE: Multidetector CT imaging of the head and cervical spine was performed following  the standard protocol without intravenous contrast. Multiplanar CT image reconstructions of the cervical spine were also generated. RADIATION DOSE REDUCTION: This exam was performed according to the departmental dose-optimization program which includes automated exposure control, adjustment of the mA and/or kV according to patient size and/or use of iterative reconstruction technique. COMPARISON:  CT head and CT cervical spine November 13, 2023. FINDINGS: CT HEAD FINDINGS Brain: No evidence of acute infarction, hemorrhage, hydrocephalus, extra-axial collection or mass lesion/mass effect. Vascular: Calcific atherosclerosis. Skull: No acute fracture. Sinuses/Orbits: Clear sinuses.  No acute orbital findings. Other: No mastoid effusions. CT CERVICAL SPINE FINDINGS Alignment: Similar anterolisthesis of C7 on T1 and T1 on T2. No traumatic malalignment. Skull base and vertebrae: No evidence of acute fracture. Chronic C2-C3 congenital segmentation anomaly. Soft tissues and spinal canal: No prevertebral fluid or swelling. No visible canal hematoma. Disc levels: Similar degenerative change including degenerative disc disease and facet/uncovertebral hypertrophy which contributes to varying degrees of neural foraminal stenosis. Upper chest: Visualized lung apices are clear.  Emphysema. IMPRESSION: 1. No evidence of acute intracranial abnormality. 2. No evidence of acute fracture or traumatic malalignment in the cervical spine. Electronically Signed   By: Gilmore GORMAN Molt M.D.   On: 07/14/2024 02:39     .Ortho Injury Treatment  Date/Time: 07/14/2024 3:43 AM  Performed by: Raford Lenis, MD Authorized by: Raford Lenis, MD   Consent:    Consent obtained:  Verbal   Consent given by:  Patient  Risks discussed:  Restricted joint movement and stiffness   Alternatives discussed:  No treatmentInjury location: shoulder Location details: right shoulder Injury type: fracture Pre-procedure neurovascular assessment:  neurovascularly intact Pre-procedure distal perfusion: normal Pre-procedure neurological function: normal Pre-procedure range of motion: reduced Manipulation performed: no Immobilization: sling Splint Applied by: ED Nurse Supplies used: Sling. Post-procedure neurovascular assessment: post-procedure neurovascularly intact Post-procedure distal perfusion: normal Post-procedure neurological function: normal Post-procedure range of motion: unchanged      Medications Ordered in the ED - No data to display                                  Medical Decision Making Amount and/or Complexity of Data Reviewed Radiology: ordered.   Following patient who is anticoagulated with injury to right shoulder.  Because of fall while taking anticoagulants, a level 2 trauma was activated.  I have ordered x-rays of the right shoulder, CT of head and cervical spine.  X-rays show fracture of the neck of the right humerus.  CT of head and cervical spine showed no acute injury.  I have independently viewed all of the images, and agree with the radiologist's interpretation.  I have ordered a sling for her humerus fracture.  Patient is right-handed, lives alone, does not have anyone who can come to stay with her.  I am holding her in the emergency department for transition of care team to see if they can set up an appropriate home living situation.     Final diagnoses:  Fall at home, initial encounter  Closed fracture of anatomical neck of right humerus, initial encounter    ED Discharge Orders     None          Raford Lenis, MD 07/14/24 [phone]

## 2024-07-14 NOTE — ED Notes (Signed)
 Spoke with pt daughter Recardo and she was updated.

## 2024-07-14 NOTE — [place] FL2 (Signed)
 [place]  MEDICAID FL2 LEVEL OF CARE FORM     IDENTIFICATION  Patient Name: Amy Fischer Birthdate: 06-12-1929 Sex: female Admission Date (Current Location): 07/14/2024  [facility] and [place] Number:  Producer, television/film/video and Address:  [facility] [place], [address] [place], [place] [address]      Provider Number: [id number]  Attending Physician Name and Address:  System, Provider Not In  Relative Name and Phone Number:       Current Level of Care: Hospital Recommended Level of Care: Skilled Nursing Facility Prior Approval Number:    Date Approved/Denied:   PASRR Number: [id number]  Discharge Plan: Home    Current Diagnoses: Patient Active Problem List   Diagnosis Date Noted   AKI (acute kidney injury) (HCC) 07/07/2024   CHF exacerbation (HCC) 07/06/2024   Paroxysmal atrial flutter (HCC) 07/06/2024   Tricuspid valve insufficiency 06/27/2024   COPD with acute exacerbation (HCC) 06/18/2024   Acute hypoxic respiratory failure (HCC) 06/18/2024   Acute on chronic diastolic (congestive) heart failure (HCC) 12/29/2022   Elevated troponin 12/29/2022   COVID-19 virus infection 12/29/2022   T12 compression fracture (HCC) 12/29/2022   Chronic anticoagulation 12/29/2022   CAD (coronary artery disease) 02/22/2016   Unsteadiness 11/20/2015   PAF (paroxysmal atrial fibrillation) (HCC) 11/06/2015   Chronic diastolic CHF (congestive heart failure), NYHA class 2 (HCC) 11/06/2015   S/P MVR (mitral valve repair) 10/10/2015   Severe mitral regurgitation    Shortness of breath    Acute congestive heart failure (HCC)    Left hip pain    Mitral regurgitation 10/02/2015   Paroxysmal atrial fibrillation (HCC) 10/01/2015   Acute respiratory failure with hypoxia and hypercapnia (HCC)    Respiratory distress 09/30/2015   Acute on chronic diastolic CHF (congestive heart failure) (HCC) 09/30/2015   Hx of fracture 04/26/2015   Diplopia 04/26/2015   Medicare annual  wellness visit, subsequent 07/26/2014   Diarrhea 03/30/2014   Rhinitis 10/06/2013   Hx of vomiting 05/26/2013   Carotid artery disease without cerebral infarction (HCC) 03/22/2013   Essential hypertension 03/22/2013   Bereavement 03/22/2013   Fracture, finger 07/13/2012   Neuritis 04/10/2012   Other constipation 03/31/2012   Hx of fall 03/31/2012   Esophageal disorder 11/27/2011   Atypical chest pain 11/27/2011   Dizziness 11/27/2011   Elevated blood pressure 03/25/2011   Subclavian steal syndrome 09/24/2010   GAIT IMBALANCE 03/19/2010   SKIN LESION, UNCERTAIN SIGNIFICANCE 09/18/2009   SLEEPLESSNESS 03/20/2009   HYPERGLYCEMIA 03/20/2009   Vitamin D  deficiency 11/09/2007   Allergic rhinitis 11/09/2007   LEG PAIN, BILATERAL 11/09/2007   Hypothyroidism 08/19/2007   Hyperlipidemia 08/19/2007   GERD 08/19/2007   Osteoarthritis 08/19/2007   Osteoporosis 08/19/2007    Orientation RESPIRATION BLADDER Height & Weight     Self, Time, Situation, Place  Normal Continent Weight: 94 lb 12.8 oz (43 kg) Height:  5' 1 (154.9 cm)  BEHAVIORAL SYMPTOMS/MOOD NEUROLOGICAL BOWEL NUTRITION STATUS      Continent Diet (Regular diet)  AMBULATORY STATUS COMMUNICATION OF NEEDS Skin   Limited Assist Verbally Normal                       Personal Care Assistance Level of Assistance  Bathing, Dressing, Feeding Bathing Assistance: Limited assistance Feeding assistance: Limited assistance Dressing Assistance: Limited assistance     Functional Limitations Info  Sight, Hearing, Speech Sight Info: Adequate Hearing Info: Adequate Speech Info: Adequate    SPECIAL CARE FACTORS FREQUENCY  PT (By licensed PT), OT (By licensed OT)     PT Frequency: 5x weekly OT Frequency: 5x weekly            Contractures Contractures Info: Not present    Additional Factors Info  Code Status, Allergies Code Status Info: Full Code Allergies Info: Actonel (Risedronate Sodium)  Bayer Aspirin  (Aspirin )   Codeine  Statins  Augmentin  (Amoxicillin -pot Clavulanate)  Tape           Current Medications (07/14/2024):  This is the current hospital active medication list Current Facility-Administered Medications  Medication Dose Route Frequency Provider Last Rate Last Admin   apixaban  (ELIQUIS ) tablet 2.5 mg  2.5 mg Oral BID Raford Lenis, MD       cholecalciferol  (VITAMIN D3) 25 MCG (1000 UNIT) tablet 1,000 Units  1,000 Units Oral Daily Raford Lenis, MD       cyanocobalamin  (VITAMIN B12) tablet 1,000 mcg  1,000 mcg Oral Daily Raford Lenis, MD       ezetimibe  (ZETIA ) tablet 10 mg  10 mg Oral Daily Raford Lenis, MD       furosemide  (LASIX ) tablet 20 mg  20 mg Oral BID Raford Lenis, MD   20 mg at 07/14/24 9160   levothyroxine  (SYNTHROID ) tablet 100 mcg  100 mcg Oral QPC breakfast Raford Lenis, MD   100 mcg at 07/14/24 9160   metoprolol  tartrate (LOPRESSOR ) tablet 25 mg  25 mg Oral BID Raford Lenis, MD       midodrine  (PROAMATINE ) tablet 5 mg  5 mg Oral TID WC Raford Lenis, MD   5 mg at 07/14/24 9160   oxyCODONE -acetaminophen  (PERCOCET/ROXICET) 5-325 MG per tablet 1 tablet  1 tablet Oral Q4H PRN Raford Lenis, MD   1 tablet at 07/14/24 9386   rOPINIRole  (REQUIP ) tablet 0.25 mg  0.25 mg Oral QHS Raford Lenis, MD       rosuvastatin  (CRESTOR ) tablet 2.5 mg  2.5 mg Oral Daily Raford Lenis, MD       Current Outpatient Medications  Medication Sig Dispense Refill   Acetaminophen  (TYLENOL  PO) Take 1-2 tablets by mouth 2 (two) times daily as needed (pain, headache, fever).     apixaban  (ELIQUIS ) 2.5 MG TABS tablet Take 1 tablet (2.5 mg total) by mouth 2 (two) times daily. 180 tablet 1   Cholecalciferol  (VITAMIN D -3 PO) Take 1 capsule by mouth daily.     Cyanocobalamin  (VITAMIN B-12 PO) Take 1 tablet by mouth daily.     ezetimibe  (ZETIA ) 10 MG tablet TAKE 1 TABLET DAILY 90 tablet 3   furosemide  (LASIX ) 20 MG tablet Take 1 tablet (20 mg total) by mouth 2 (two) times daily. (Patient taking differently: Take 20 mg  by mouth See admin instructions. Take 1 tablet (20mg ) by mouth in the morning with breakfast and take 1 tablet at 1500.) 60 tablet 1   levothyroxine  (SYNTHROID ) 100 MCG tablet TAKE 1 TABLET EVERY MORNING ON AN EMPTY STOMACH 90 tablet 3   metoprolol  tartrate (LOPRESSOR ) 25 MG tablet Take 1 tablet (25 mg total) by mouth 2 (two) times daily. 60 tablet 1   midodrine  (PROAMATINE ) 5 MG tablet Take 1 tablet (5 mg total) by mouth 3 (three) times daily with meals. 90 tablet 0   Polyethyl Glycol-Propyl Glycol (SYSTANE OP) Place 1 drop into both eyes 2 (two) times daily.     rOPINIRole  (REQUIP ) 0.25 MG tablet Take 1 tablet (0.25 mg total) by mouth at bedtime. For restless leg 90 tablet 0   rosuvastatin  (CRESTOR ) 5  MG tablet Take 0.5 tablets (2.5 mg total) by mouth daily. 90 tablet 3     Discharge Medications: Please see discharge summary for a list of discharge medications.  Relevant Imaging Results:  Relevant Lab Results:   Additional Information SSN# [id number]  Amy LITTIE Portugal, LCSW

## 2024-07-14 NOTE — Discharge Planning (Signed)
 Transition of Care [facility]) - Emergency Department Mini Assessment   Patient Details  Name: Amy Fischer MRN: [id number] Date of Birth: 08-15-1929  Transition of Care [facility]) CM/SW Contact:    Debarah Saunas, RN Phone Number: 07/14/2024, 9:10 AM   Clinical Narrative: RNCM met with pt at bedside regarding discharge plan.  Pt states she would love to return home but realizes that she may need to go to a skilled nursing facility to get stronger before retruning home.  Pt utilizes walker for mobility and is independent with ADLs.   ED Mini Assessment: What brought you to the Emergency Department? : I don't remember what I was doing, but I fell on my right side.  Barriers to Discharge: Other (must enter comment) (awaiting PT eval)        Interventions which prevented an admission or readmission: SNF Placement    Patient Contact and Communications Key Contact 1: Mliss Ee      ,     Contact Phone Number: [phone]  RNCM spoke with daughter to update her on discharge plan.  Mliss is in agreement ad would like to be kept updated.  Patient states their goals for this hospitalization and ongoing recovery are:: to discharge and get the help I need to get stronger. CMS Medicare.gov Compare Post Acute Care list provided to:: Patient Choice offered to / list presented to : Patient  Admission diagnosis:  shoulder injury Patient Active Problem List   Diagnosis Date Noted   AKI (acute kidney injury) (HCC) 07/07/2024   CHF exacerbation (HCC) 07/06/2024   Paroxysmal atrial flutter (HCC) 07/06/2024   Tricuspid valve insufficiency 06/27/2024   COPD with acute exacerbation (HCC) 06/18/2024   Acute hypoxic respiratory failure (HCC) 06/18/2024   Acute on chronic diastolic (congestive) heart failure (HCC) 12/29/2022   Elevated troponin 12/29/2022   COVID-19 virus infection 12/29/2022   T12 compression fracture (HCC) 12/29/2022   Chronic anticoagulation 12/29/2022   CAD (coronary  artery disease) 02/22/2016   Unsteadiness 11/20/2015   PAF (paroxysmal atrial fibrillation) (HCC) 11/06/2015   Chronic diastolic CHF (congestive heart failure), NYHA class 2 (HCC) 11/06/2015   S/P MVR (mitral valve repair) 10/10/2015   Severe mitral regurgitation    Shortness of breath    Acute congestive heart failure (HCC)    Left hip pain    Mitral regurgitation 10/02/2015   Paroxysmal atrial fibrillation (HCC) 10/01/2015   Acute respiratory failure with hypoxia and hypercapnia (HCC)    Respiratory distress 09/30/2015   Acute on chronic diastolic CHF (congestive heart failure) (HCC) 09/30/2015   Hx of fracture 04/26/2015   Diplopia 04/26/2015   Medicare annual wellness visit, subsequent 07/26/2014   Diarrhea 03/30/2014   Rhinitis 10/06/2013   Hx of vomiting 05/26/2013   Carotid artery disease without cerebral infarction (HCC) 03/22/2013   Essential hypertension 03/22/2013   Bereavement 03/22/2013   Fracture, finger 07/13/2012   Neuritis 04/10/2012   Other constipation 03/31/2012   Hx of fall 03/31/2012   Esophageal disorder 11/27/2011   Atypical chest pain 11/27/2011   Dizziness 11/27/2011   Elevated blood pressure 03/25/2011   Subclavian steal syndrome 09/24/2010   GAIT IMBALANCE 03/19/2010   SKIN LESION, UNCERTAIN SIGNIFICANCE 09/18/2009   SLEEPLESSNESS 03/20/2009   HYPERGLYCEMIA 03/20/2009   Vitamin D  deficiency 11/09/2007   Allergic rhinitis 11/09/2007   LEG PAIN, BILATERAL 11/09/2007   Hypothyroidism 08/19/2007   Hyperlipidemia 08/19/2007   GERD 08/19/2007   Osteoarthritis 08/19/2007   Osteoporosis 08/19/2007   PCP:  Panosh, Wanda K,  MD Pharmacy:   [facility] - [address], [place] - [address] [place] [address] Phone: [phone] Fax: [phone]  [facility] [id number] - [address] [place] - [address] [place] [address] Phone: [phone] Fax: [phone]

## 2024-07-14 NOTE — Progress Notes (Signed)
 Orthopedic Tech Progress Note Patient Details:  LOLITHA Fischer 26-May-1929 [id number]  Ortho Devices Type of Ortho Device: Shoulder immobilizer Ortho Device/Splint Location: RUE Ortho Device/Splint Interventions: Ordered, Application   Post Interventions Patient Tolerated: Fair Instructions Provided: Care of device   Gleason Ardoin [name] 07/14/2024, 4:53 AM

## 2024-07-14 NOTE — Progress Notes (Addendum)
 10:40am: Patient can go to [facility], room #503B via [facility] - RN to call for pick up after 11:30am. The number to call for report is [phone].  CSW spoke with RN and MD to inform them of information.  9:30am: CSW obtained bed offer from [facility].  CSW spoke with [place] at [facility] who states patient can be accepted at the facility today.  CSW spoke with patient's daughter Recardo to inform her of discharge plan. Recardo states she will be at the hospital shortly.  9am: CSW completed FL2 and faxed patient's clinicals out to facilities to obtain bed offers.  Niels Portugal, MSW, LCSW Transitions of Care  Clinical Social Worker II [phone]

## 2024-07-14 NOTE — ED Notes (Signed)
Pt transported to CT with this nurse °

## 2024-07-15 ENCOUNTER — Telehealth: Payer: Self-pay

## 2024-07-15 NOTE — Transitions of Care (Post Inpatient/ED Visit) (Signed)
   07/15/2024  Name: Amy Fischer MRN: [id number] DOB: 07-11-1929  Today's TOC FU Call Status: Today's TOC FU Call Status:: Unsuccessful Call (1st Attempt) Unsuccessful Call (1st Attempt) Date: 07/15/24  Attempted to reach the patient regarding the most recent Inpatient/ED visit.  Follow Up Plan: Additional outreach attempts will be made to reach the patient to complete the Transitions of Care (Post Inpatient/ED visit) call.   Signature : Custer Pimenta, CMA

## 2024-07-18 ENCOUNTER — Telehealth: Payer: Self-pay

## 2024-07-18 DIAGNOSIS — S42301A Unspecified fracture of shaft of humerus, right arm, initial encounter for closed fracture: Secondary | ICD-10-CM | POA: Diagnosis not present

## 2024-07-18 DIAGNOSIS — G4700 Insomnia, unspecified: Secondary | ICD-10-CM | POA: Diagnosis not present

## 2024-07-18 DIAGNOSIS — K5900 Constipation, unspecified: Secondary | ICD-10-CM | POA: Diagnosis not present

## 2024-07-18 NOTE — Telephone Encounter (Signed)
 Copied from CRM [id number]. Topic: General - Call Back - No Documentation >> Jul 15, 2024  4:55 PM Mia F wrote: Reason for CRM: Pt daughter calling office back in regards to a missed call from nurse.

## 2024-07-18 NOTE — Telephone Encounter (Signed)
 Contact pt's daughter, Recardo (on [place]). She reports pt is at [facility] and receiving PT there and they would like OC with a different company. Recardo continues they will follow up with Orthopedic Surgeon tomorrow. Inform her that the ortho can help with the referral. Daughter verbalized understanding. Daughter declined a hospital f/u appt at this time. Encourage daughter to give us  a call when they are ready for f/u or if have any questions.   Daughter verbalized understanding.

## 2024-07-19 DIAGNOSIS — S42221A 2-part displaced fracture of surgical neck of right humerus, initial encounter for closed fracture: Secondary | ICD-10-CM | POA: Diagnosis not present

## 2024-07-21 DIAGNOSIS — G458 Other transient cerebral ischemic attacks and related syndromes: Secondary | ICD-10-CM | POA: Diagnosis not present

## 2024-07-21 DIAGNOSIS — I340 Nonrheumatic mitral (valve) insufficiency: Secondary | ICD-10-CM | POA: Diagnosis not present

## 2024-07-21 DIAGNOSIS — I2510 Atherosclerotic heart disease of native coronary artery without angina pectoris: Secondary | ICD-10-CM | POA: Diagnosis not present

## 2024-07-21 DIAGNOSIS — I10 Essential (primary) hypertension: Secondary | ICD-10-CM | POA: Diagnosis not present

## 2024-07-25 ENCOUNTER — Ambulatory Visit: Attending: Cardiology | Admitting: Cardiology

## 2024-07-25 ENCOUNTER — Encounter: Payer: Self-pay | Admitting: Cardiology

## 2024-07-25 VITALS — BP 127/85 | Ht 61.0 in | Wt 95.1 lb

## 2024-07-25 DIAGNOSIS — I10 Essential (primary) hypertension: Secondary | ICD-10-CM | POA: Insufficient documentation

## 2024-07-25 DIAGNOSIS — I340 Nonrheumatic mitral (valve) insufficiency: Secondary | ICD-10-CM | POA: Diagnosis not present

## 2024-07-25 DIAGNOSIS — I071 Rheumatic tricuspid insufficiency: Secondary | ICD-10-CM | POA: Insufficient documentation

## 2024-07-25 DIAGNOSIS — I480 Paroxysmal atrial fibrillation: Secondary | ICD-10-CM | POA: Insufficient documentation

## 2024-07-25 DIAGNOSIS — E785 Hyperlipidemia, unspecified: Secondary | ICD-10-CM | POA: Diagnosis not present

## 2024-07-25 DIAGNOSIS — I5033 Acute on chronic diastolic (congestive) heart failure: Secondary | ICD-10-CM | POA: Insufficient documentation

## 2024-07-25 DIAGNOSIS — I779 Disorder of arteries and arterioles, unspecified: Secondary | ICD-10-CM | POA: Diagnosis not present

## 2024-07-25 DIAGNOSIS — Z9889 Other specified postprocedural states: Secondary | ICD-10-CM | POA: Diagnosis not present

## 2024-07-25 NOTE — Progress Notes (Signed)
 Cardiology Office Note:   Date:  07/25/2024  ID:  Amy Fischer, DOB 1929-07-17, MRN [id number] PCP: Charlett [name], MD  [facility] Providers Cardiologist:  Redell Shallow, MD    History of Present Illness:    Discussed the use of AI scribe software for clinical note transcription with the patient, who gave verbal consent to proceed.  History of Present Illness Amy Fischer is a 89 year old female with paroxysmal atrial fibrillation and chronic heart failure with preserved ejection fraction who presents for follow-up after recent hospitalizations for shortness of breath and a fall resulting in a right humeral neck fracture. She is accompanied by her daughter.  She has a history of paroxysmal atrial fibrillation following mitral valve repair, nonobstructive coronary artery disease, hypertension, and hyperlipidemia. She was recently hospitalized on June 18, 2024, for worsening lower extremity edema and shortness of breath. During that admission, she was found to be in atrial fibrillation with rapid ventricular response and received IV Lasix  and metoprolol . An echocardiogram showed a left ventricular ejection fraction of 65-70%, moderately elevated pulmonary artery systolic pressure, mild to moderate mitral valve regurgitation, and severe tricuspid regurgitation. She was started on oral amiodarone  for atrial flutter and atrial fibrillation management. Also discharged on PRN lasix  20mg .   A month ago, she was stable on amiodarone , a beta blocker, and Eliquis . However, she was hospitalized shortly after with shortness of breath and was found to have atrial fibrillation with rapid ventricular response. During this admission, amiodarone  was discontinued due to chest x-ray abnormalities, and her treatment was adjusted to include an increased dose of beta blockers and midodrine  for blood pressure support. She was discharged on metoprolol  tartrate 25 mg twice daily, midodrine  5 mg three times  daily, and Lasix  20 mg daily.  On July 17th, she returned to the emergency department after a fall at home, resulting in a right humeral neck fracture. Due to her frailty and fall risk, she was transferred to [facility], a skilled nursing facility. Today patient reports that her shoulder/arm is not painful unless moved.   Since her discharge, she has experienced occasional shortness of breath, particularly during physical activity such as walking with physical therapy. No shortness of breath while sitting still.  She reports a significant decrease in appetite, primarily eating only  breakfast and consuming little to no lunch or dinner. She has lost 8 pounds in the past month, dropping from 103 to 95 pounds. She finds nutritional supplements like Ensure and Thrive unpalatable due to their sweetness.  She is currently on metoprolol  tartrate, midodrine , Lasix , and a low dose of rosuvastatin , which she takes half a tablet daily (2.5mg ). She also receives PRN Zofran  for nausea and has been given melatonin for sleep.  Her daughter reports an incident where a nurse noted hypotension but did not follow up. Patient herself expresses some distrust in the nursing staff at the facility.   Today patient denies chest pain, lower extremity edema, palpitations. She continues to feel frail and weak following recent admission.  Studies Reviewed:     Risk Assessment/Calculations:    CHA2DS2-VASc Score = 6   This indicates a 9.7% annual risk of stroke. The patient's score is based upon: CHF History: 1 HTN History: 1 Diabetes History: 0 Stroke History: 0 Vascular Disease History: 1 Age Score: 2 Gender Score: 1             Physical Exam:   VS:  BP 127/85 (BP Location: Left Arm, Patient Position:  Sitting, Cuff Size: Normal)   Ht 5' 1 (1.549 m)   Wt 95 lb 1.6 oz (43.1 kg)   SpO2 90%   BMI 17.97 kg/m    Wt Readings from Last 3 Encounters:  07/25/24 95 lb 1.6 oz (43.1 kg)  07/14/24 94 lb 12.8 oz  (43 kg)  07/09/24 94 lb 2.2 oz (42.7 kg)     Physical Exam Vitals reviewed.  Constitutional:      Appearance: Normal appearance.     Comments: Frail. In wheelchair.  HENT:     Head: Normocephalic.     Nose: Nose normal.  Eyes:     Pupils: Pupils are equal, round, and reactive to light.  Cardiovascular:     Rate and Rhythm: Normal rate. Rhythm irregular.     Pulses: Normal pulses.     Heart sounds: Normal heart sounds. No murmur heard.    No friction rub. No gallop.  Pulmonary:     Effort: Pulmonary effort is normal.     Breath sounds: Normal breath sounds.  Abdominal:     General: Abdomen is flat.  Musculoskeletal:     Right lower leg: No edema.     Left lower leg: No edema.  Skin:    General: Skin is warm and dry.     Capillary Refill: Capillary refill takes less than 2 seconds.  Neurological:     General: No focal deficit present.     Mental Status: She is alert and oriented to person, place, and time.  Psychiatric:        Mood and Affect: Mood normal.        Behavior: Behavior normal.        Thought Content: Thought content normal.        Judgment: Judgment normal.     ASSESSMENT AND PLAN:    Assessment & Plan Chronic heart failure with preserved ejection fraction Chronic heart failure with preserved ejection fraction. Recent exacerbation likely secondary to RVR. Remains on Lasix  20mg  daily. No extremity edema today, clear lungs, and controlled heart rate. Shortness of breath with physical activity consistent with deconditioning and valvular dysfunction. Discussed importance of fluid balance and consistent daily fluid intake for symptom management and medication efficacy. - Monitor breathing and diuretic dosing closely - Encourage consistent daily fluid intake - Recheck electrolytes and kidney function today - Continue Lasix  20mg  daily - Recommend PRN compression stockings for leg swelling.  Persistent atrial fibrillation Persistent atrial fibrillation with  recent rapid ventricular response episode. Amiodarone  discontinued due to lung imaging concerns. Has since been managed with metoprolol  for rate control and midodrine  for blood pressure support. Heart rate controlled today, rhythm irregular. Rate control strategy appropriate for current circumstances. - Continue metoprolol  tartrate 25 mg twice daily - Continue midodrine  5 mg three times daily - Continue Eliquis  2.5mg  BID - No plans for cardioversion at this time. - Monitor heart rate and rhythm  Hypertension Continues with low normal BP. Midodrine  was used in the hospital to prevent hypotension and to support Metoprolol  for rate control. - Continue metoprolol  as prescribed. - Continue Midodrine  5mg  TID. - Monitor blood pressure daily.  Severe tricuspid regurgitation Severe tricuspid regurgitation certainly contributing to symptoms of fluid overload and shortness of breath. Surgical intervention not recommended due to age. Management focuses on symptom control with medications. - Manage symptoms with diuretics and monitor for fluid overload.  Mitral valve repair S/P repair with most recent echo showing mild to moderate MR with mean gradient 5.5 mmHg.  Unintentional weight loss Unintentional weight loss of 8 pounds over two weeks. Reduced appetite and nausea possibly exacerbated by previous amiodarone  use. Emphasized importance of maintaining nutritional intake to prevent further weight loss and support overall health, including fluid balance and organ function. - Encourage increased caloric intake, including breakfast, lunch, and dinner - Encourage nutritional supplements like Ensure, despite taste preferences - Monitor weight closely  Hyperlipidemia Patient with hx limited statin tolerance. Given age and frailty, will defer further cholesterol checks at this time. - Reasonable to continue low dose Crestor .  Follow up 2-3 months.           Signed, Artist Pouch, PA-C

## 2024-07-25 NOTE — Patient Instructions (Signed)
 Medication Instructions:  NO CHANGES *If you need a refill on your cardiac medications before your next appointment, please call your pharmacy*  Lab Work: BMP AND MAGNESIUM  TODAY If you have labs (blood work) drawn today and your tests are completely normal, you will receive your results only by: MyChart Message (if you have MyChart) OR A paper copy in the mail If you have any lab test that is abnormal or we need to change your treatment, we will call you to review the results.  Testing/Procedures: NO TESTING  Follow-Up: At [facility], you and your health needs are our priority.  As part of our continuing mission to provide you with exceptional heart care, our providers are all part of one team.  This team includes your primary Cardiologist (physician) and Advanced Practice Providers or APPs (Physician Assistants and Nurse Practitioners) who all work together to provide you with the care you need, when you need it.  Your next appointment:   3 month(s)  Provider:   Redell Shallow, MD

## 2024-07-26 ENCOUNTER — Ambulatory Visit: Payer: Self-pay | Admitting: Cardiology

## 2024-07-26 LAB — BASIC METABOLIC PANEL WITH GFR
BUN/Creatinine Ratio: 29 — ABNORMAL HIGH (ref 12–28)
BUN: 32 mg/dL (ref 10–36)
CO2: 29 mmol/L (ref 20–29)
Calcium: 10.2 mg/dL (ref 8.7–10.3)
Chloride: 99 mmol/L (ref 96–106)
Creatinine, Ser: 1.09 mg/dL — ABNORMAL HIGH (ref 0.57–1.00)
Glucose: 90 mg/dL (ref 70–99)
Potassium: 4.3 mmol/L (ref 3.5–5.2)
Sodium: 143 mmol/L (ref 134–144)
eGFR: 47 mL/min/1.73 — ABNORMAL LOW (ref >59)

## 2024-07-26 LAB — MAGNESIUM: Magnesium: 2.2 mg/dL (ref 1.6–2.3)

## 2024-07-28 DIAGNOSIS — I340 Nonrheumatic mitral (valve) insufficiency: Secondary | ICD-10-CM | POA: Diagnosis not present

## 2024-07-28 DIAGNOSIS — I10 Essential (primary) hypertension: Secondary | ICD-10-CM | POA: Diagnosis not present

## 2024-07-28 DIAGNOSIS — I2510 Atherosclerotic heart disease of native coronary artery without angina pectoris: Secondary | ICD-10-CM | POA: Diagnosis not present

## 2024-07-28 DIAGNOSIS — G458 Other transient cerebral ischemic attacks and related syndromes: Secondary | ICD-10-CM | POA: Diagnosis not present

## 2024-08-01 DIAGNOSIS — G459 Transient cerebral ischemic attack, unspecified: Secondary | ICD-10-CM | POA: Diagnosis not present

## 2024-08-01 DIAGNOSIS — F331 Major depressive disorder, recurrent, moderate: Secondary | ICD-10-CM | POA: Diagnosis not present

## 2024-08-01 DIAGNOSIS — G4700 Insomnia, unspecified: Secondary | ICD-10-CM | POA: Diagnosis not present

## 2024-08-04 DIAGNOSIS — I2510 Atherosclerotic heart disease of native coronary artery without angina pectoris: Secondary | ICD-10-CM | POA: Diagnosis not present

## 2024-08-04 DIAGNOSIS — G458 Other transient cerebral ischemic attacks and related syndromes: Secondary | ICD-10-CM | POA: Diagnosis not present

## 2024-08-04 DIAGNOSIS — I340 Nonrheumatic mitral (valve) insufficiency: Secondary | ICD-10-CM | POA: Diagnosis not present

## 2024-08-04 DIAGNOSIS — I10 Essential (primary) hypertension: Secondary | ICD-10-CM | POA: Diagnosis not present

## 2024-08-15 ENCOUNTER — Telehealth: Payer: Self-pay

## 2024-08-15 DIAGNOSIS — F331 Major depressive disorder, recurrent, moderate: Secondary | ICD-10-CM | POA: Diagnosis not present

## 2024-08-15 DIAGNOSIS — G4700 Insomnia, unspecified: Secondary | ICD-10-CM | POA: Diagnosis not present

## 2024-08-15 NOTE — Telephone Encounter (Signed)
 Pt ready for scheduling for Prolia  on or after : 09/09/24  Option# 1: Buy/Bill (Office supplied medication)  Out-of-pocket cost due at time of clinic visit: $0  Primary: Medicare Prolia  co-insurance: 0% Admin fee co-insurance: 0%  Secondary: Tricare Prolia  co-insurance: 0% Admin fee co-insurance: 0%  Medical Benefit Details: Date Benefits were checked: 08/12/24 Deductible: $257 out of $257/ Coinsurance: 0%/ Admin Fee: $0  Prior Auth: Not needed PA# Expiration Date:   # of doses approved: -----------------------------------------------------------------------  ** This summary of benefits is an estimation of the patient's out-of-pocket cost. Exact cost may very based on individual plan coverage.

## 2024-08-17 DIAGNOSIS — S42221D 2-part displaced fracture of surgical neck of right humerus, subsequent encounter for fracture with routine healing: Secondary | ICD-10-CM | POA: Diagnosis not present

## 2024-08-17 NOTE — Telephone Encounter (Signed)
 Attempted to reach pt. Left a voicemail to call us back.

## 2024-08-18 NOTE — Telephone Encounter (Signed)
 Attempted to reach pt. Left a voicemail to call us back.

## 2024-08-23 NOTE — Telephone Encounter (Signed)
 Attempted to reach pt. Memory is full.

## 2024-08-29 DIAGNOSIS — M1990 Unspecified osteoarthritis, unspecified site: Secondary | ICD-10-CM | POA: Diagnosis not present

## 2024-08-29 DIAGNOSIS — G629 Polyneuropathy, unspecified: Secondary | ICD-10-CM | POA: Diagnosis not present

## 2024-08-29 DIAGNOSIS — Z9181 History of falling: Secondary | ICD-10-CM | POA: Diagnosis not present

## 2024-08-29 DIAGNOSIS — R278 Other lack of coordination: Secondary | ICD-10-CM | POA: Diagnosis not present

## 2024-08-31 ENCOUNTER — Telehealth: Payer: Self-pay

## 2024-08-31 ENCOUNTER — Encounter: Payer: Self-pay | Admitting: Internal Medicine

## 2024-08-31 ENCOUNTER — Encounter: Payer: Self-pay | Admitting: Cardiology

## 2024-08-31 DIAGNOSIS — G629 Polyneuropathy, unspecified: Secondary | ICD-10-CM | POA: Diagnosis not present

## 2024-08-31 DIAGNOSIS — M1990 Unspecified osteoarthritis, unspecified site: Secondary | ICD-10-CM | POA: Diagnosis not present

## 2024-08-31 DIAGNOSIS — R278 Other lack of coordination: Secondary | ICD-10-CM | POA: Diagnosis not present

## 2024-08-31 DIAGNOSIS — Z9181 History of falling: Secondary | ICD-10-CM | POA: Diagnosis not present

## 2024-08-31 NOTE — Progress Notes (Deleted)
 Future  orders are  from 7 8 2025 fir tsh abd free t4

## 2024-08-31 NOTE — Telephone Encounter (Signed)
 Follow up with daughter regarding to mychart message.   Brought the attention to Dr. Charlett. She advise pt to follow up her cardiology. Relay message to daughter. She states they have appt in October. Advise daughter contact their office if they can see her earlier for an Acute visit. Daughter verbalized understanding.

## 2024-09-01 DIAGNOSIS — G629 Polyneuropathy, unspecified: Secondary | ICD-10-CM | POA: Diagnosis not present

## 2024-09-01 DIAGNOSIS — R278 Other lack of coordination: Secondary | ICD-10-CM | POA: Diagnosis not present

## 2024-09-01 DIAGNOSIS — Z9181 History of falling: Secondary | ICD-10-CM | POA: Diagnosis not present

## 2024-09-01 DIAGNOSIS — M1990 Unspecified osteoarthritis, unspecified site: Secondary | ICD-10-CM | POA: Diagnosis not present

## 2024-09-02 ENCOUNTER — Encounter: Payer: Self-pay | Admitting: Adult Health

## 2024-09-02 ENCOUNTER — Ambulatory Visit: Admitting: Adult Health

## 2024-09-02 VITALS — BP 110/80 | HR 95 | Temp 98.0°F | Ht 61.0 in

## 2024-09-02 DIAGNOSIS — R4182 Altered mental status, unspecified: Secondary | ICD-10-CM

## 2024-09-02 LAB — CBC
HCT: 43 % (ref 36.0–46.0)
Hemoglobin: 13.8 g/dL (ref 12.0–15.0)
MCHC: 32.2 g/dL (ref 30.0–36.0)
MCV: 100.1 fl — ABNORMAL HIGH (ref 78.0–100.0)
Platelets: 169 K/uL (ref 150.0–400.0)
RBC: 4.29 Mil/uL (ref 3.87–5.11)
RDW: 15.1 % (ref 11.5–15.5)
WBC: 8.1 K/uL (ref 4.0–10.5)

## 2024-09-02 LAB — BASIC METABOLIC PANEL WITH GFR
BUN: 27 mg/dL — ABNORMAL HIGH (ref 6–23)
CO2: 32 meq/L (ref 19–32)
Calcium: 10.1 mg/dL (ref 8.4–10.5)
Chloride: 103 meq/L (ref 96–112)
Creatinine, Ser: 1.16 mg/dL (ref 0.40–1.20)
GFR: 40.66 mL/min — ABNORMAL LOW (ref >60.00)
Glucose, Bld: 76 mg/dL (ref 70–99)
Potassium: 4.1 meq/L (ref 3.5–5.1)
Sodium: 145 meq/L (ref 135–145)

## 2024-09-02 NOTE — Telephone Encounter (Unsigned)
 Copied from CRM [id number]. Topic: Appointments - Scheduling Inquiry for Clinic >> Sep 02, 2024  8:33 AM [name] wrote: Reason for CRM: patient Daughter [name] stating she spoke Dr Charlett nurse said to follow up the patient cardiologist. [name] is asking if the appt for 09/02/24 at 2:30pm with Amy Shape NP is still necessary.   Please call [name] phone [phone]    Patient was made aware their call will be returned in 1 business day.

## 2024-09-02 NOTE — Progress Notes (Signed)
 Subjective:    Patient ID: Amy Fischer, female    DOB: 01-22-1929, 89 y.o.   MRN: [id number]  HPI 89 year old female who  has a past medical history of Allergy, Anemia, Asymptomatic carotid artery stenosis, Bladder polyps, Cataract, Chronic heart failure with preserved ejection fraction (HFpEF) (HCC), Diverticulosis, Episodic recurrent vertigo, Esophageal spasm, GERD (gastroesophageal reflux disease), Hepatic hemangioma, History of hepatitis, Hyperlipidemia, Hyperplastic colon polyp, Hypothyroidism, Intestinal metaplasia of gastric mucosa, Osteoarthritis, Osteoporosis, Patellar fracture (07/13/2012), Scapular fracture (03/31/2012), [name].  She is a patient of Dr. Charlett who I am seeing today.   Discussed the use of AI scribe software for clinical note transcription with the patient, who gave verbal consent to proceed.  History of Present Illness   Amy Fischer is a 89 year old female who presents for concerns of altered mental status. She is accompanied by her daughter, who is also her primary caregiver.  She has a history of heart failure and atrial fibrillation, with recent episodes of shortness of breath and altered mental status. At a rehabilitation facility, she developed nocturnal dyspnea, requiring two liters of oxygen at night, which she continues to use. Recently, she experienced an episode of altered mental status with a flat affect, difficulty responding, shortness of breath, and blurred vision. Her blood pressure was elevated at 140/114, and her pulse was irregular and weak. She improved with oxygen before EMS arrived. EMS advised ER visit but since she was back to her baseline she refused to go to the ER. She has not had any further episodes        Review of Systems See HPI   Past Medical History:  Diagnosis Date   Allergy    Anemia    Asymptomatic carotid artery stenosis    R ICA 40% stenosis on CTA 12/2011.   Bladder polyps    Cataract     BILATERAL-REMOVED   Chronic heart failure with preserved ejection fraction (HFpEF) (HCC)    Diverticulosis    Episodic recurrent vertigo    MRI Head 2003   Esophageal spasm    GERD (gastroesophageal reflux disease)    Hepatic hemangioma    History of hepatitis    unknown type   Hyperlipidemia    Hyperplastic colon polyp    Hypothyroidism    Intestinal metaplasia of gastric mucosa    Osteoarthritis    Osteoporosis    Patellar fracture 07/13/2012   Scapular fracture 03/31/2012   small from direct blow with trip     Subclavian steal syndrome    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; CTA 2013 revealed open graft    Social History   Socioeconomic History   Marital status: Widowed    Spouse name: Not on file   Number of children: 4   Years of education: Not on file   Highest education level: Bachelor's degree (e.g., BA, AB, BS)  Occupational History   Occupation: retired Runner, broadcasting/film/video  Tobacco Use   Smoking status: Former    Current packs/day: 0.00    Average packs/day: 0.8 packs/day for 20.0 years (15.0 ttl pk-yrs)    Types: Cigarettes    Start date: 01/05/1971    Quit date: 01/05/1991    Years since quitting: 33.6   Smokeless tobacco: Never  Vaping Use   Vaping status: Never Used  Substance and Sexual Activity   Alcohol  use: Not Currently    Alcohol /week: 4.0 standard drinks of alcohol     Types: 4 Glasses  of wine per week    Comment: socially   Drug use: No   Sexual activity: Not on file  Other Topics Concern   Not on file  Social History Narrative   Widowed.  Lives alone in a one story home.  Has 4 children (3 living).  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    HH of 1    No pets   Former smoker   Exercises regularly   Right handed   Social Drivers of Corporate investment banker Strain: Low Risk  (07/04/2024)   Overall Financial Resource Strain (CARDIA)    Difficulty of Paying Living Expenses: Not hard at all  Food Insecurity: No Food Insecurity (07/11/2024)   Hunger  Vital Sign    Worried About Running Out of Food in the Last Year: Never true    Ran Out of Food in the Last Year: Never true  Transportation Needs: No Transportation Needs (07/06/2024)   PRAPARE - Administrator, Civil Service (Medical): No    Lack of Transportation (Non-Medical): No  Physical Activity: Insufficiently Active (07/04/2024)   Exercise Vital Sign    Days of Exercise per Week: 2 days    Minutes of Exercise per Session: 50 min  Stress: No Stress Concern Present (07/04/2024)   [employer] of Occupational Health - Occupational Stress Questionnaire    Feeling of Stress: Only a little  Social Connections: Moderately Integrated (07/11/2024)   Social Connection and Isolation Panel    Frequency of Communication with Friends and Family: Three times a week    Frequency of Social Gatherings with Friends and Family: Three times a week    Attends Religious Services: More than 4 times per year    Active Member of Clubs or Organizations: Yes    Attends Banker Meetings: More than 4 times per year    Marital Status: Widowed  Intimate Partner Violence: Not At Risk (07/11/2024)   Humiliation, Afraid, Rape, and Kick questionnaire    Fear of Current or Ex-Partner: No    Emotionally Abused: No    Physically Abused: No    Sexually Abused: No    Past Surgical History:  Procedure Laterality Date   APPENDECTOMY     CARDIAC CATHETERIZATION N/A 10/04/2015   Procedure: Right/Left Heart Cath and Coronary Angiography;  Surgeon: Victory LELON Sharps, MD;  Location: [facility] INVASIVE CV LAB;  Service: Cardiovascular;  Laterality: N/A;   CAROTID-SUBCLAVIAN BYPASS GRAFT Left 1999   for [place] steal syndrome   CHOLECYSTECTOMY     COLONOSCOPY     CYSTECTOMY Left    hand   ELBOW SURGERY Left    KNEE SURGERY Left 2014   MITRAL VALVE REPAIR N/A 10/10/2015   Procedure: MITRAL VALVE REPAIR (MVR) WITH SIZE 28 CARPENTIER-EDWARDS PHYSIO II ANNULOPLASTY RING;  Surgeon: Elspeth JAYSON Millers, MD;   Location: [facility] OR;  Service: Open Heart Surgery;  Laterality: N/A;   ROTATOR CUFF REPAIR Left    TEAR DUCT PROBING  07/2014   TEE WITHOUT CARDIOVERSION N/A 10/03/2015   Procedure: TRANSESOPHAGEAL ECHOCARDIOGRAM (TEE);  Surgeon: Ezra GORMAN Shuck, MD;  Location: [facility] ENDOSCOPY;  Service: Cardiovascular;  Laterality: N/A;   TEE WITHOUT CARDIOVERSION N/A 10/10/2015   Procedure: TRANSESOPHAGEAL ECHOCARDIOGRAM (TEE);  Surgeon: Elspeth JAYSON Millers, MD;  Location: [facility] OR;  Service: Open Heart Surgery;  Laterality: N/A;   TONSILLECTOMY     TUBAL LIGATION      Family History  Problem Relation Age of Onset   Hypertension Mother  Stroke Mother    Heart disease Father    Colon cancer Neg Hx    Neurofibromatosis Neg Hx    Allergic rhinitis Neg Hx    Asthma Neg Hx    Angioedema Neg Hx    Atopy Neg Hx    Eczema Neg Hx    Immunodeficiency Neg Hx    Urticaria Neg Hx     Allergies  Allergen Reactions   Actonel [Risedronate Sodium] Other (See Comments)    Upset stomach  OK to take Fosamax    Bayer Aspirin  [Aspirin ] Other (See Comments)    Told to avoid due to Eliquis  use.   Codeine Nausea And Vomiting   Statins Other (See Comments)    Myalgias   OK to take Crestor  2.5mg  QD.    Augmentin  [Amoxicillin -Pot Clavulanate] Diarrhea   Tape Other (See Comments)    Skin blisters  OK to use paper tape    Current Outpatient Medications on File Prior to Visit  Medication Sig Dispense Refill   Acetaminophen  (TYLENOL  PO) Take 1-2 tablets by mouth 2 (two) times daily as needed (pain, headache, fever).     apixaban  (ELIQUIS ) 2.5 MG TABS tablet Take 1 tablet (2.5 mg total) by mouth 2 (two) times daily. 180 tablet 1   Cholecalciferol  (VITAMIN D -3 PO) Take 1 capsule by mouth daily.     Cyanocobalamin  (VITAMIN B-12 PO) Take 1 tablet by mouth daily.     ezetimibe  (ZETIA ) 10 MG tablet TAKE 1 TABLET DAILY 90 tablet 3   furosemide  (LASIX ) 20 MG tablet Take 1 tablet (20 mg total) by mouth 2 (two) times daily.  (Patient taking differently: Take 20 mg by mouth See admin instructions. Take 1 tablet (20mg ) by mouth in the morning with breakfast and take 1 tablet at 1500.) 60 tablet 1   levothyroxine  (SYNTHROID ) 100 MCG tablet TAKE 1 TABLET EVERY MORNING ON AN EMPTY STOMACH 90 tablet 3   melatonin 5 MG TABS Take 5 mg by mouth at bedtime. May increase to 10 mg as needed for sleep     metoprolol  tartrate (LOPRESSOR ) 25 MG tablet Take 1 tablet (25 mg total) by mouth 2 (two) times daily. 60 tablet 1   midodrine  (PROAMATINE ) 5 MG tablet Take 5 mg by mouth 3 (three) times daily with meals.     mirtazapine (REMERON) 15 MG tablet Take 15 mg by mouth at bedtime.     Polyethyl Glycol-Propyl Glycol (SYSTANE OP) Place 1 drop into both eyes 2 (two) times daily.     rOPINIRole  (REQUIP ) 0.25 MG tablet Take 1 tablet (0.25 mg total) by mouth at bedtime. For restless leg 90 tablet 0   rosuvastatin  (CRESTOR ) 5 MG tablet Take 0.5 tablets (2.5 mg total) by mouth daily. (Patient taking differently: Take 2.5 mg by mouth daily. Per facility paperwork patient is tablet 5mg  tablet) 90 tablet 3   senna (SENOKOT) 8.6 MG TABS tablet Take 2 tablets by mouth daily. Hold if having loose stools     traMADol  (ULTRAM ) 50 MG tablet Take 50 mg by mouth 3 (three) times daily.     No current facility-administered medications on file prior to visit.    BP 110/80   Pulse 95   Temp 98 F (36.7 C) (Oral)   Ht 5' 1 (1.549 m)   BMI 17.97 kg/m       Objective:   Physical Exam Vitals and nursing note reviewed.  Constitutional:      Appearance: She is well-developed.  Cardiovascular:  Rate and Rhythm: Normal rate and regular rhythm.     Pulses: Normal pulses.     Heart sounds: Normal heart sounds.  Pulmonary:     Effort: Pulmonary effort is normal.     Breath sounds: Normal breath sounds.  Musculoskeletal:        General: Normal range of motion.  Skin:    General: Skin is warm and dry.     Capillary Refill: Capillary refill takes  less than 2 seconds.  Neurological:     General: No focal deficit present.     Mental Status: She is alert and oriented to person, place, and time.     Cranial Nerves: Cranial nerves 2-12 are intact.     Sensory: Sensation is intact.     Motor: Motor function is intact.     Coordination: Coordination is intact.     Gait: Gait abnormal (steady gait with rolling walker).  Psychiatric:        Mood and Affect: Mood normal.        Behavior: Behavior normal.        Thought Content: Thought content normal.        Judgment: Judgment normal.       Assessment & Plan:  1. Altered mental status, unspecified altered mental status type (Primary) - No neuro deficits today so doubt she had a CVA. Possible TIA. Possibly could have been related to Afib? She is back in sinus rhythm today. She does take Eliquis  2.5 mg BID. We discussed getting imaging of her head vs watchful waiting. Since her symptoms have resolved and were temporary, she and family agreed to do blood work and watchful waiting which I do not disagree with given her age. Advised to go to the ER if symptoms return. She has a follow up with Cardiology in 2 weeks  - CBC; Future - Basic Metabolic Panel; Future - Basic Metabolic Panel - CBC  Darleene Shape, NP  I personally spent a total of 34 minutes in the care of the patient today including preparing to see the patient, getting/reviewing separately obtained history, performing a medically appropriate exam/evaluation, counseling and educating, placing orders, and documenting clinical information in the EHR.

## 2024-09-02 NOTE — Telephone Encounter (Signed)
 Contacted pt to keep appt. Pt was seen with [facility].

## 2024-09-04 ENCOUNTER — Ambulatory Visit: Payer: Self-pay | Admitting: Adult Health

## 2024-09-05 ENCOUNTER — Other Ambulatory Visit

## 2024-09-05 ENCOUNTER — Ambulatory Visit

## 2024-09-05 ENCOUNTER — Encounter: Payer: Self-pay | Admitting: Internal Medicine

## 2024-09-05 DIAGNOSIS — G629 Polyneuropathy, unspecified: Secondary | ICD-10-CM | POA: Diagnosis not present

## 2024-09-05 DIAGNOSIS — M1990 Unspecified osteoarthritis, unspecified site: Secondary | ICD-10-CM | POA: Diagnosis not present

## 2024-09-05 DIAGNOSIS — Z9181 History of falling: Secondary | ICD-10-CM | POA: Diagnosis not present

## 2024-09-05 DIAGNOSIS — Z79899 Other long term (current) drug therapy: Secondary | ICD-10-CM

## 2024-09-05 DIAGNOSIS — R278 Other lack of coordination: Secondary | ICD-10-CM | POA: Diagnosis not present

## 2024-09-05 DIAGNOSIS — E039 Hypothyroidism, unspecified: Secondary | ICD-10-CM | POA: Diagnosis not present

## 2024-09-05 NOTE — Addendum Note (Signed)
 Addended by: DUANNE KATE RAMAN on: 09/05/2024 12:12 PM   Modules accepted: Orders

## 2024-09-05 NOTE — Telephone Encounter (Signed)
 I dont understand what happened  Of course    if could be added on please have the tsh and free t4 added on. ( I think they  only keep the specimen for 5-7 days so would need to be added right away)

## 2024-09-06 ENCOUNTER — Ambulatory Visit: Payer: Self-pay | Admitting: Internal Medicine

## 2024-09-06 LAB — TSH: TSH: 4.29 m[IU]/L (ref 0.40–4.50)

## 2024-09-06 LAB — T4, FREE: Free T4: 1.5 ng/dL (ref 0.8–1.8)

## 2024-09-06 NOTE — Progress Notes (Signed)
 Results  of thyroid   levels are now in  range  . No further action needed in this regard

## 2024-09-06 NOTE — Telephone Encounter (Signed)
 Lab add on was faxed.

## 2024-09-07 DIAGNOSIS — M1990 Unspecified osteoarthritis, unspecified site: Secondary | ICD-10-CM | POA: Diagnosis not present

## 2024-09-07 DIAGNOSIS — G629 Polyneuropathy, unspecified: Secondary | ICD-10-CM | POA: Diagnosis not present

## 2024-09-07 DIAGNOSIS — R278 Other lack of coordination: Secondary | ICD-10-CM | POA: Diagnosis not present

## 2024-09-07 DIAGNOSIS — Z9181 History of falling: Secondary | ICD-10-CM | POA: Diagnosis not present

## 2024-09-08 DIAGNOSIS — M1990 Unspecified osteoarthritis, unspecified site: Secondary | ICD-10-CM | POA: Diagnosis not present

## 2024-09-08 DIAGNOSIS — R278 Other lack of coordination: Secondary | ICD-10-CM | POA: Diagnosis not present

## 2024-09-08 DIAGNOSIS — Z9181 History of falling: Secondary | ICD-10-CM | POA: Diagnosis not present

## 2024-09-08 DIAGNOSIS — G629 Polyneuropathy, unspecified: Secondary | ICD-10-CM | POA: Diagnosis not present

## 2024-09-09 ENCOUNTER — Other Ambulatory Visit (INDEPENDENT_AMBULATORY_CARE_PROVIDER_SITE_OTHER): Payer: Self-pay

## 2024-09-09 DIAGNOSIS — M810 Age-related osteoporosis without current pathological fracture: Secondary | ICD-10-CM | POA: Diagnosis not present

## 2024-09-09 MED ORDER — DENOSUMAB 60 MG/ML ~~LOC~~ SOSY
60.0000 mg | PREFILLED_SYRINGE | SUBCUTANEOUS | Status: AC
  Administered 2024-10-24: 60 mg via SUBCUTANEOUS

## 2024-09-09 NOTE — Progress Notes (Signed)
 Patient is on Prolia  report and is overdue now. Order placed for Prolia  to initiate PA.

## 2024-09-12 DIAGNOSIS — R278 Other lack of coordination: Secondary | ICD-10-CM | POA: Diagnosis not present

## 2024-09-12 DIAGNOSIS — Z9181 History of falling: Secondary | ICD-10-CM | POA: Diagnosis not present

## 2024-09-12 DIAGNOSIS — G629 Polyneuropathy, unspecified: Secondary | ICD-10-CM | POA: Diagnosis not present

## 2024-09-12 DIAGNOSIS — M1990 Unspecified osteoarthritis, unspecified site: Secondary | ICD-10-CM | POA: Diagnosis not present

## 2024-09-13 ENCOUNTER — Ambulatory Visit: Admitting: Nurse Practitioner

## 2024-09-13 DIAGNOSIS — G629 Polyneuropathy, unspecified: Secondary | ICD-10-CM | POA: Diagnosis not present

## 2024-09-13 DIAGNOSIS — R278 Other lack of coordination: Secondary | ICD-10-CM | POA: Diagnosis not present

## 2024-09-13 DIAGNOSIS — Z9181 History of falling: Secondary | ICD-10-CM | POA: Diagnosis not present

## 2024-09-13 DIAGNOSIS — M1990 Unspecified osteoarthritis, unspecified site: Secondary | ICD-10-CM | POA: Diagnosis not present

## 2024-09-14 DIAGNOSIS — S42221D 2-part displaced fracture of surgical neck of right humerus, subsequent encounter for fracture with routine healing: Secondary | ICD-10-CM | POA: Diagnosis not present

## 2024-09-19 DIAGNOSIS — G629 Polyneuropathy, unspecified: Secondary | ICD-10-CM | POA: Diagnosis not present

## 2024-09-19 DIAGNOSIS — R278 Other lack of coordination: Secondary | ICD-10-CM | POA: Diagnosis not present

## 2024-09-19 DIAGNOSIS — Z9181 History of falling: Secondary | ICD-10-CM | POA: Diagnosis not present

## 2024-09-19 DIAGNOSIS — M1990 Unspecified osteoarthritis, unspecified site: Secondary | ICD-10-CM | POA: Diagnosis not present

## 2024-09-20 DIAGNOSIS — Z9181 History of falling: Secondary | ICD-10-CM | POA: Diagnosis not present

## 2024-09-20 DIAGNOSIS — G629 Polyneuropathy, unspecified: Secondary | ICD-10-CM | POA: Diagnosis not present

## 2024-09-20 DIAGNOSIS — R278 Other lack of coordination: Secondary | ICD-10-CM | POA: Diagnosis not present

## 2024-09-20 DIAGNOSIS — M1990 Unspecified osteoarthritis, unspecified site: Secondary | ICD-10-CM | POA: Diagnosis not present

## 2024-09-22 DIAGNOSIS — G629 Polyneuropathy, unspecified: Secondary | ICD-10-CM | POA: Diagnosis not present

## 2024-09-22 DIAGNOSIS — M1990 Unspecified osteoarthritis, unspecified site: Secondary | ICD-10-CM | POA: Diagnosis not present

## 2024-09-22 DIAGNOSIS — R278 Other lack of coordination: Secondary | ICD-10-CM | POA: Diagnosis not present

## 2024-09-22 DIAGNOSIS — Z9181 History of falling: Secondary | ICD-10-CM | POA: Diagnosis not present

## 2024-09-28 DIAGNOSIS — G629 Polyneuropathy, unspecified: Secondary | ICD-10-CM | POA: Diagnosis not present

## 2024-09-28 DIAGNOSIS — Z9181 History of falling: Secondary | ICD-10-CM | POA: Diagnosis not present

## 2024-09-28 DIAGNOSIS — R278 Other lack of coordination: Secondary | ICD-10-CM | POA: Diagnosis not present

## 2024-09-28 DIAGNOSIS — M1990 Unspecified osteoarthritis, unspecified site: Secondary | ICD-10-CM | POA: Diagnosis not present

## 2024-09-29 DIAGNOSIS — G629 Polyneuropathy, unspecified: Secondary | ICD-10-CM | POA: Diagnosis not present

## 2024-09-29 DIAGNOSIS — Z961 Presence of intraocular lens: Secondary | ICD-10-CM | POA: Diagnosis not present

## 2024-09-29 DIAGNOSIS — R278 Other lack of coordination: Secondary | ICD-10-CM | POA: Diagnosis not present

## 2024-09-29 DIAGNOSIS — H532 Diplopia: Secondary | ICD-10-CM | POA: Diagnosis not present

## 2024-09-29 DIAGNOSIS — M1990 Unspecified osteoarthritis, unspecified site: Secondary | ICD-10-CM | POA: Diagnosis not present

## 2024-09-29 DIAGNOSIS — Z9181 History of falling: Secondary | ICD-10-CM | POA: Diagnosis not present

## 2024-09-29 NOTE — Progress Notes (Signed)
 HPI: Follow-up mitral valve repair. Patient hospitalized October 2016 with congestive heart failure. Transesophageal echocardiogram showed normal LV function. There was a flail posterior leaflet with severe mitral regurgitation. There was mild to moderate tricuspid regurgitation. Cardiac catheterization October 2016 showed a 30% LAD, 60% first diagonal and 30% right coronary artery. Ejection fraction 60%. Patient subsequent had mitral valve repair. Note preoperative carotid Dopplers showed 1-39% stenosis. Note the patient had atrial fibrillation after surgery.  Follow-up monitor March 2019 showed sinus rhythm with PVCs and paroxysmal atrial flutter/fibrillation. Patient placed on amiodarone  and apixaban .  Follow-up monitor February 2021 due to presyncope showed sinus with occasional PAC, PVC and brief PAT. Patient hospitalized June 2025 after falling and suffering right humeral neck fracture.  Also with atrial fibrillation with rapid ventricular response and CHF.  Echocardiogram June 2025 showed ejection fraction 65 to 70%, mild biatrial enlargement, status post mitral valve repair with mild to moderate mitral regurgitation, mild mitral stenosis with mean gradient 5.5 mmHg, severe tricuspid regurgitation, trace aortic insufficiency.  Amiodarone  previously discontinued due to potential lung toxicity.  Patient seen again in the emergency room yesterday following a fall.  Since last seen, she has some dyspnea on exertion and minimal pedal edema.  No chest pain, palpitations or syncope.  Current Outpatient Medications  Medication Sig Dispense Refill   Acetaminophen  (TYLENOL  PO) Take 1-2 tablets by mouth 2 (two) times daily as needed (pain, headache, fever).     apixaban  (ELIQUIS ) 2.5 MG TABS tablet Take 1 tablet (2.5 mg total) by mouth 2 (two) times daily. 180 tablet 1   Cholecalciferol  (VITAMIN D -3 PO) Take 1 capsule by mouth daily.     Cyanocobalamin  (VITAMIN B-12 PO) Take 1 tablet by mouth daily.      ezetimibe  (ZETIA ) 10 MG tablet TAKE 1 TABLET DAILY (Patient taking differently: Take 10 mg by mouth daily. Patient takes half tab) 90 tablet 3   furosemide  (LASIX ) 20 MG tablet Take 1 tablet (20 mg total) by mouth 2 (two) times daily. (Patient taking differently: Take 20 mg by mouth See admin instructions. Take 1 tablet (20mg ) by mouth in the morning with breakfast and take 1 tablet at 1500.) 60 tablet 1   levothyroxine  (SYNTHROID ) 100 MCG tablet TAKE 1 TABLET EVERY MORNING ON AN EMPTY STOMACH 90 tablet 3   melatonin 5 MG TABS Take 5 mg by mouth at bedtime. May increase to 10 mg as needed for sleep     metoprolol  tartrate (LOPRESSOR ) 25 MG tablet Take 1 tablet (25 mg total) by mouth 2 (two) times daily. 60 tablet 1   midodrine  (PROAMATINE ) 5 MG tablet Take 5 mg by mouth 3 (three) times daily with meals. (Patient taking differently: Take 5 mg by mouth 3 (three) times daily with meals. Patient takes PRN)     mirtazapine (REMERON) 15 MG tablet Take 15 mg by mouth at bedtime. (Patient taking differently: Take 15 mg by mouth at bedtime. Patient takes half tab)     Polyethyl Glycol-Propyl Glycol (SYSTANE OP) Place 1 drop into both eyes 2 (two) times daily.     rOPINIRole  (REQUIP ) 0.25 MG tablet TAKE 1 TABLET AT BEDTIME FOR RESTLESS LEG 90 tablet 3   rosuvastatin  (CRESTOR ) 5 MG tablet TAKE ONE-HALF (1/2) TABLET DAILY 45 tablet 3   senna (SENOKOT) 8.6 MG TABS tablet Take 2 tablets by mouth daily. Hold if having loose stools     traMADol  (ULTRAM ) 50 MG tablet Take 50 mg by mouth 3 (three) times daily. (  Patient not taking: Reported on 10/13/2024)     Current Facility-Administered Medications  Medication Dose Route Frequency Provider Last Rate Last Admin   denosumab  (PROLIA ) injection 60 mg  60 mg Subcutaneous Q6 months Panosh, Wanda K, MD         Past Medical History:  Diagnosis Date   Allergy    Anemia    Asymptomatic carotid artery stenosis    R ICA 40% stenosis on CTA 12/2011.   Bladder polyps     Cataract    BILATERAL-REMOVED   Chronic heart failure with preserved ejection fraction (HFpEF) (HCC)    Diverticulosis    Episodic recurrent vertigo    MRI Head 2003   Esophageal spasm    GERD (gastroesophageal reflux disease)    Hepatic hemangioma    History of hepatitis    unknown type   Hyperlipidemia    Hyperplastic colon polyp    Hypothyroidism    Intestinal metaplasia of gastric mucosa    Osteoarthritis    Osteoporosis    Patellar fracture 07/13/2012   Scapular fracture 03/31/2012   small from direct blow with trip     Subclavian steal syndrome    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; CTA 2013 revealed open graft    Past Surgical History:  Procedure Laterality Date   APPENDECTOMY     CARDIAC CATHETERIZATION N/A 10/04/2015   Procedure: Right/Left Heart Cath and Coronary Angiography;  Surgeon: Victory LELON Sharps, MD;  Location: [facility] INVASIVE CV LAB;  Service: Cardiovascular;  Laterality: N/A;   CAROTID-SUBCLAVIAN BYPASS GRAFT Left 1999   for [place] steal syndrome   CHOLECYSTECTOMY     COLONOSCOPY     CYSTECTOMY Left    hand   ELBOW SURGERY Left    KNEE SURGERY Left 2014   MITRAL VALVE REPAIR N/A 10/10/2015   Procedure: MITRAL VALVE REPAIR (MVR) WITH SIZE 28 CARPENTIER-EDWARDS PHYSIO II ANNULOPLASTY RING;  Surgeon: Elspeth JAYSON Millers, MD;  Location: [facility] OR;  Service: Open Heart Surgery;  Laterality: N/A;   ROTATOR CUFF REPAIR Left    TEAR DUCT PROBING  07/2014   TEE WITHOUT CARDIOVERSION N/A 10/03/2015   Procedure: TRANSESOPHAGEAL ECHOCARDIOGRAM (TEE);  Surgeon: Ezra GORMAN Shuck, MD;  Location: [facility] ENDOSCOPY;  Service: Cardiovascular;  Laterality: N/A;   TEE WITHOUT CARDIOVERSION N/A 10/10/2015   Procedure: TRANSESOPHAGEAL ECHOCARDIOGRAM (TEE);  Surgeon: Elspeth JAYSON Millers, MD;  Location: [facility] OR;  Service: Open Heart Surgery;  Laterality: N/A;   TONSILLECTOMY     TUBAL LIGATION      Social History   Socioeconomic History   Marital status: Widowed    Spouse name: Not  on file   Number of children: 4   Years of education: Not on file   Highest education level: Bachelor's degree (e.g., BA, AB, BS)  Occupational History   Occupation: retired Runner, broadcasting/film/video  Tobacco Use   Smoking status: Former    Current packs/day: 0.00    Average packs/day: 0.8 packs/day for 20.0 years (15.0 ttl pk-yrs)    Types: Cigarettes    Start date: 01/05/1971    Quit date: 01/05/1991    Years since quitting: 33.7   Smokeless tobacco: Never  Vaping Use   Vaping status: Never Used  Substance and Sexual Activity   Alcohol  use: Not Currently    Alcohol /week: 4.0 standard drinks of alcohol     Types: 4 Glasses of wine per week    Comment: socially   Drug use: No   Sexual activity: Not on file  Other Topics Concern   Not on file  Social History Narrative   Widowed.  Lives alone in a one story home.  Has 4 children (3 living).  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    HH of 1    No pets   Former smoker   Exercises regularly   Right handed   Social Drivers of Corporate investment banker Strain: Low Risk  (07/04/2024)   Overall Financial Resource Strain (CARDIA)    Difficulty of Paying Living Expenses: Not hard at all  Food Insecurity: No Food Insecurity (07/11/2024)   Hunger Vital Sign    Worried About Running Out of Food in the Last Year: Never true    Ran Out of Food in the Last Year: Never true  Transportation Needs: No Transportation Needs (07/06/2024)   PRAPARE - Administrator, Civil Service (Medical): No    Lack of Transportation (Non-Medical): No  Physical Activity: Insufficiently Active (07/04/2024)   Exercise Vital Sign    Days of Exercise per Week: 2 days    Minutes of Exercise per Session: 50 min  Stress: No Stress Concern Present (07/04/2024)   [employer] of Occupational Health - Occupational Stress Questionnaire    Feeling of Stress: Only a little  Social Connections: Moderately Integrated (07/11/2024)   Social Connection and Isolation Panel    Frequency of  Communication with Friends and Family: Three times a week    Frequency of Social Gatherings with Friends and Family: Three times a week    Attends Religious Services: More than 4 times per year    Active Member of Clubs or Organizations: Yes    Attends Banker Meetings: More than 4 times per year    Marital Status: Widowed  Intimate Partner Violence: Not At Risk (07/11/2024)   Humiliation, Afraid, Rape, and Kick questionnaire    Fear of Current or Ex-Partner: No    Emotionally Abused: No    Physically Abused: No    Sexually Abused: No    Family History  Problem Relation Age of Onset   Hypertension Mother    Stroke Mother    Heart disease Father    Colon cancer Neg Hx    Neurofibromatosis Neg Hx    Allergic rhinitis Neg Hx    Asthma Neg Hx    Angioedema Neg Hx    Atopy Neg Hx    Eczema Neg Hx    Immunodeficiency Neg Hx    Urticaria Neg Hx     ROS: Weight loss but no fevers or chills, productive cough, hemoptysis, dysphasia, odynophagia, melena, hematochezia, dysuria, hematuria, rash, seizure activity, orthopnea, PND,  claudication. Remaining systems are negative.  Physical Exam: Well-developed frail in no acute distress.  Skin is warm and dry.  HEENT is normal.  Neck is supple.  Chest is clear to auscultation with normal expansion.  Cardiovascular exam is irregular Abdominal exam nontender or distended. No masses palpated. Extremities show trace edema. neuro grossly intact  A/P  1 paroxysmal atrial fibrillation-patient is in atrial fibrillation on exam.  Continue metoprolol  for rate control.  I do not think she is a good candidate for cardioversion and she would like conservative measures as well.  Patient with recent falls including arm fracture.  I now think risk of anticoagulation outweighs benefit.  We discussed this in depth and we will discontinue apixaban .  She understands the higher risk of embolic event including CVA but also understands risk outweighs  benefit and is in agreement  with our plan.    2 hypertension-patient now with hypotension requiring midodrine .  3 status post mitral valve repair-most recent echocardiogram showed mild mitral stenosis and mild to moderate mitral regurgitation.  Continue SBE prophylaxis.  4 tricuspid regurgitation-severe.  Conservative measures given patient's age and frail body habitus.  5 nonobstructive coronary artery disease-no chest pain noted.  Continue statin.  6 hyperlipidemia-continue Zetia  and statin.  7 chronic CHF preserved ejection fraction-volume status is reasonable on examination.  Continue Lasix  at present dose.  She apparently misses doses per her daughter and we discussed the importance of complying.  Check potassium and renal function.  8 no CODE BLUE-patient has deteriorated significantly over the past 3 months per her daughter.  She has made it clear she does not want intubation, defibrillation or CPR.  Redell Shallow, MD

## 2024-10-03 DIAGNOSIS — R278 Other lack of coordination: Secondary | ICD-10-CM | POA: Diagnosis not present

## 2024-10-03 DIAGNOSIS — M1990 Unspecified osteoarthritis, unspecified site: Secondary | ICD-10-CM | POA: Diagnosis not present

## 2024-10-03 DIAGNOSIS — G629 Polyneuropathy, unspecified: Secondary | ICD-10-CM | POA: Diagnosis not present

## 2024-10-03 DIAGNOSIS — Z9181 History of falling: Secondary | ICD-10-CM | POA: Diagnosis not present

## 2024-10-05 DIAGNOSIS — Z9181 History of falling: Secondary | ICD-10-CM | POA: Diagnosis not present

## 2024-10-05 DIAGNOSIS — G629 Polyneuropathy, unspecified: Secondary | ICD-10-CM | POA: Diagnosis not present

## 2024-10-05 DIAGNOSIS — R278 Other lack of coordination: Secondary | ICD-10-CM | POA: Diagnosis not present

## 2024-10-05 DIAGNOSIS — M1990 Unspecified osteoarthritis, unspecified site: Secondary | ICD-10-CM | POA: Diagnosis not present

## 2024-10-08 ENCOUNTER — Other Ambulatory Visit: Payer: Self-pay | Admitting: Internal Medicine

## 2024-10-10 DIAGNOSIS — M1990 Unspecified osteoarthritis, unspecified site: Secondary | ICD-10-CM | POA: Diagnosis not present

## 2024-10-10 DIAGNOSIS — G629 Polyneuropathy, unspecified: Secondary | ICD-10-CM | POA: Diagnosis not present

## 2024-10-10 DIAGNOSIS — R278 Other lack of coordination: Secondary | ICD-10-CM | POA: Diagnosis not present

## 2024-10-10 DIAGNOSIS — Z9181 History of falling: Secondary | ICD-10-CM | POA: Diagnosis not present

## 2024-10-12 ENCOUNTER — Other Ambulatory Visit: Payer: Self-pay

## 2024-10-12 ENCOUNTER — Emergency Department (HOSPITAL_BASED_OUTPATIENT_CLINIC_OR_DEPARTMENT_OTHER)
Admission: EM | Admit: 2024-10-12 | Discharge: 2024-10-12 | Disposition: A | Discharge status: Home / Self Care | Attending: Emergency Medicine | Admitting: Emergency Medicine

## 2024-10-12 ENCOUNTER — Encounter (HOSPITAL_BASED_OUTPATIENT_CLINIC_OR_DEPARTMENT_OTHER): Payer: Self-pay | Admitting: Emergency Medicine

## 2024-10-12 ENCOUNTER — Emergency Department (HOSPITAL_BASED_OUTPATIENT_CLINIC_OR_DEPARTMENT_OTHER): Admitting: Radiology

## 2024-10-12 DIAGNOSIS — Z79899 Other long term (current) drug therapy: Secondary | ICD-10-CM | POA: Insufficient documentation

## 2024-10-12 DIAGNOSIS — Z23 Encounter for immunization: Secondary | ICD-10-CM | POA: Diagnosis not present

## 2024-10-12 DIAGNOSIS — Y9301 Activity, walking, marching and hiking: Secondary | ICD-10-CM | POA: Insufficient documentation

## 2024-10-12 DIAGNOSIS — Z7901 Long term (current) use of anticoagulants: Secondary | ICD-10-CM | POA: Insufficient documentation

## 2024-10-12 DIAGNOSIS — R0781 Pleurodynia: Secondary | ICD-10-CM | POA: Diagnosis not present

## 2024-10-12 DIAGNOSIS — I4891 Unspecified atrial fibrillation: Secondary | ICD-10-CM | POA: Diagnosis not present

## 2024-10-12 DIAGNOSIS — S20221A Contusion of right back wall of thorax, initial encounter: Secondary | ICD-10-CM | POA: Diagnosis not present

## 2024-10-12 DIAGNOSIS — S299XXA Unspecified injury of thorax, initial encounter: Secondary | ICD-10-CM | POA: Diagnosis present

## 2024-10-12 DIAGNOSIS — S298XXA Other specified injuries of thorax, initial encounter: Secondary | ICD-10-CM

## 2024-10-12 DIAGNOSIS — I509 Heart failure, unspecified: Secondary | ICD-10-CM | POA: Diagnosis not present

## 2024-10-12 DIAGNOSIS — I110 Hypertensive heart disease with heart failure: Secondary | ICD-10-CM | POA: Diagnosis not present

## 2024-10-12 DIAGNOSIS — S20212A Contusion of left front wall of thorax, initial encounter: Secondary | ICD-10-CM | POA: Diagnosis not present

## 2024-10-12 DIAGNOSIS — W19XXXA Unspecified fall, initial encounter: Secondary | ICD-10-CM | POA: Insufficient documentation

## 2024-10-12 DIAGNOSIS — M549 Dorsalgia, unspecified: Secondary | ICD-10-CM | POA: Diagnosis not present

## 2024-10-12 MED ORDER — MIDODRINE HCL 5 MG PO TABS
5.0000 mg | ORAL_TABLET | ORAL | Status: AC
  Administered 2024-10-12: 5 mg via ORAL
  Filled 2024-10-12: qty 1

## 2024-10-12 MED ORDER — LIDOCAINE 5 % EX PTCH
1.0000 | MEDICATED_PATCH | CUTANEOUS | Status: DC
  Administered 2024-10-12: 1 via TRANSDERMAL
  Filled 2024-10-12: qty 1

## 2024-10-12 MED ORDER — TETANUS-DIPHTH-ACELL PERTUSSIS 5-2-15.5 LF-MCG/0.5 IM SUSP
0.5000 mL | Freq: Once | INTRAMUSCULAR | Status: AC
  Administered 2024-10-12: 0.5 mL via INTRAMUSCULAR
  Filled 2024-10-12: qty 0.5

## 2024-10-12 MED ORDER — ACETAMINOPHEN 500 MG PO TABS
1000.0000 mg | ORAL_TABLET | Freq: Once | ORAL | Status: AC
  Administered 2024-10-12: 1000 mg via ORAL
  Filled 2024-10-12: qty 2

## 2024-10-12 NOTE — Discharge Instructions (Signed)
 You were seen for your rib pain in the emergency department.  It is likely that you have a bruised rib.  At home, please take Tylenol  and use over-the-counter lidocaine  patches for your pain.  Please also use the incentive spirometer we have given you to prevent any pneumonias.  Check your MyChart online for the results of any tests that had not resulted by the time you left the emergency department.   Follow-up with your primary doctor in 2-3 days regarding your visit.    Return immediately to the emergency department if you experience any of the following: Difficulty breathing, fever, severe pain, or any other concerning symptoms.    Thank you for visiting our Emergency Department. It was a pleasure taking care of you today.

## 2024-10-12 NOTE — ED Provider Notes (Signed)
  EMERGENCY DEPARTMENT AT [facility] Provider Note   CSN: [id number] Arrival date & time: 10/12/24  1209     Patient presents with: Amy Fischer is a 89 y.o. female.  {Add pertinent medical, surgical, social history, OB history to HPI:1358} 89 year old female history of hypertension, hyperlipidemia, CHF, subclavian steal syndrome status post grafting, and atrial fibrillation on Eliquis  who presents emergency department after a fall.  Patient reports that Amy Fischer was walking last night when Amy Fischer lost her balance and fell down.  No preceding symptoms.  Did not hit her head.  Has been having pain on the right side of her ribs since then.  No hip pain.  Has been able to walk since.  Says that the pain is mild but somewhat bothersome so decided to come in to make sure Amy Fischer did not have any broken ribs       Prior to Admission medications   Medication Sig Start Date End Date Taking? Authorizing Provider  Acetaminophen  (TYLENOL  PO) Take 1-2 tablets by mouth 2 (two) times daily as needed (pain, headache, fever).    [provider]  apixaban  (ELIQUIS ) 2.5 MG TABS tablet Take 1 tablet (2.5 mg total) by mouth 2 (two) times daily. 06/27/24   Trudy Birmingham, PA-C  Cholecalciferol  (VITAMIN D -3 PO) Take 1 capsule by mouth daily.    [provider]  Cyanocobalamin  (VITAMIN B-12 PO) Take 1 tablet by mouth daily.    [provider]  ezetimibe  (ZETIA ) 10 MG tablet TAKE 1 TABLET DAILY 08/04/23   Panosh, Wanda K, MD  furosemide  (LASIX ) 20 MG tablet Take 1 tablet (20 mg total) by mouth 2 (two) times daily. Patient taking differently: Take 20 mg by mouth See admin instructions. Take 1 tablet (20mg ) by mouth in the morning with breakfast and take 1 tablet at 1500. 07/09/24   Rosario Eland I, MD  levothyroxine  (SYNTHROID ) 100 MCG tablet TAKE 1 TABLET EVERY MORNING ON AN EMPTY STOMACH 10/14/23   Webb, Padonda B, FNP  melatonin 5 MG TABS Take 5 mg by mouth at  bedtime. May increase to 10 mg as needed for sleep    [provider]  metoprolol  tartrate (LOPRESSOR ) 25 MG tablet Take 1 tablet (25 mg total) by mouth 2 (two) times daily. 07/09/24   Rosario Eland FERNS, MD  midodrine  (PROAMATINE ) 5 MG tablet Take 5 mg by mouth 3 (three) times daily with meals.    [provider]  mirtazapine (REMERON) 15 MG tablet Take 15 mg by mouth at bedtime. 08/27/24   [provider]  Polyethyl Glycol-Propyl Glycol (SYSTANE OP) Place 1 drop into both eyes 2 (two) times daily.    [provider]  rOPINIRole  (REQUIP ) 0.25 MG tablet Take 1 tablet (0.25 mg total) by mouth at bedtime. For restless leg 07/12/24 09/02/24  Panosh, Wanda K, MD  rosuvastatin  (CRESTOR ) 5 MG tablet Take 0.5 tablets (2.5 mg total) by mouth daily. Patient taking differently: Take 2.5 mg by mouth daily. Per facility paperwork patient is tablet 5mg  tablet 08/03/23   Panosh, Wanda K, MD  senna (SENOKOT) 8.6 MG TABS tablet Take 2 tablets by mouth daily. Hold if having loose stools    [provider]  traMADol  (ULTRAM ) 50 MG tablet Take 50 mg by mouth 3 (three) times daily.    [provider]    Allergies: Actonel [risedronate sodium], Bayer aspirin  [aspirin ], Codeine, Statins, Augmentin  [amoxicillin -pot clavulanate], and Tape    Review of Systems  Updated  Vital Signs BP 124/82 (BP Location: Right Arm)   Pulse 84   Temp 98.4 F (36.9 C) (Oral)   Resp 17   SpO2 95%   Physical Exam Constitutional:      General: Amy Fischer is not in acute distress.    Appearance: Normal appearance. Amy Fischer is not ill-appearing.  HENT:     Head: Normocephalic and atraumatic.     Right Ear: External ear normal.     Left Ear: External ear normal.     Mouth/Throat:     Mouth: Mucous membranes are moist.     Pharynx: Oropharynx is clear.  Eyes:     Extraocular Movements: Extraocular movements intact.     Conjunctiva/sclera: Conjunctivae normal.     Pupils: Pupils are equal,  round, and reactive to light.  Neck:     Comments: No C-spine midline tenderness to palpation Cardiovascular:     Rate and Rhythm: Normal rate. Rhythm irregular.     Pulses: Normal pulses.     Heart sounds: Normal heart sounds.     Comments: Bruising and abrasions to right posterior chest wall Pulmonary:     Effort: Pulmonary effort is normal. No respiratory distress.     Breath sounds: Normal breath sounds.  Abdominal:     General: Abdomen is flat. There is no distension.     Palpations: Abdomen is soft. There is no mass.     Tenderness: There is no abdominal tenderness. There is no guarding.  Musculoskeletal:        General: No deformity. Normal range of motion.     Cervical back: No rigidity or tenderness.     Comments: No tenderness to palpation of midline thoracic or lumbar spine.  No step-offs palpated.  No tenderness to palpation of bilateral clavicles.  No tenderness to palpation, bruising, or deformities noted of bilateral shoulders, elbows, wrists, hips, knees, or ankles.   Neurological:     General: No focal deficit present.     Mental Status: Amy Fischer is alert. Mental status is at baseline.     Cranial Nerves: No cranial nerve deficit.     Motor: No weakness.     (all labs ordered are listed, but only abnormal results are displayed) Labs Reviewed - No data to display  EKG: None  Radiology: No results found.  {Document cardiac monitor, telemetry assessment procedure when appropriate:32947} Procedures   Medications Ordered in the ED  lidocaine  (LIDODERM ) 5 % 1 patch (1 patch Transdermal Patch Applied 10/12/24 1227)  acetaminophen  (TYLENOL ) tablet 1,000 mg (1,000 mg Oral Given 10/12/24 1226)      {Click here for ABCD2, HEART and other calculators REFRESH Note before signing:1}                              Medical Decision Making Amount and/or Complexity of Data Reviewed Radiology: ordered.  Risk OTC drugs. Prescription drug  management.   ***  {Document critical care time when appropriate  Document review of labs and clinical decision tools ie CHADS2VASC2, etc  Document your independent review of radiology images and any outside records  Document your discussion with family members, caretakers and with consultants  Document social determinants of health affecting pt's care  Document your decision making why or why not admission, treatments were needed:32947:::1}   Final diagnoses:  None    ED Discharge Orders     None

## 2024-10-12 NOTE — ED Triage Notes (Addendum)
 C/o unwitnessed mechanical fall last night. Fell backwards after losing balance. Takes eliquis . Denies hitting head. C/o back pain and R sided rib pain. On floor approx 2 hours before family arrived.

## 2024-10-13 ENCOUNTER — Encounter: Payer: Self-pay | Admitting: Cardiology

## 2024-10-13 ENCOUNTER — Ambulatory Visit: Attending: Cardiology | Admitting: Cardiology

## 2024-10-13 VITALS — BP 92/64 | HR 80 | Ht 61.0 in | Wt 99.0 lb

## 2024-10-13 DIAGNOSIS — Z9889 Other specified postprocedural states: Secondary | ICD-10-CM | POA: Insufficient documentation

## 2024-10-13 DIAGNOSIS — I071 Rheumatic tricuspid insufficiency: Secondary | ICD-10-CM | POA: Diagnosis not present

## 2024-10-13 DIAGNOSIS — I480 Paroxysmal atrial fibrillation: Secondary | ICD-10-CM | POA: Diagnosis not present

## 2024-10-13 DIAGNOSIS — I2510 Atherosclerotic heart disease of native coronary artery without angina pectoris: Secondary | ICD-10-CM | POA: Diagnosis not present

## 2024-10-13 DIAGNOSIS — E785 Hyperlipidemia, unspecified: Secondary | ICD-10-CM | POA: Insufficient documentation

## 2024-10-13 NOTE — Patient Instructions (Signed)
 Medication Instructions:   STOP ELIQUIS   *If you need a refill on your cardiac medications before your next appointment, please call your pharmacy*   Follow-Up: At [facility], you and your health needs are our priority.  As part of our continuing mission to provide you with exceptional heart care, our providers are all part of one team.  This team includes your primary Cardiologist (physician) and Advanced Practice Providers or APPs (Physician Assistants and Nurse Practitioners) who all work together to provide you with the care you need, when you need it.  Your next appointment:   3 month(s)  Provider:   Redell Shallow, MD

## 2024-10-14 ENCOUNTER — Ambulatory Visit: Payer: Self-pay | Admitting: Cardiology

## 2024-10-14 LAB — BASIC METABOLIC PANEL WITH GFR
BUN/Creatinine Ratio: 19 (ref 12–28)
BUN: 21 mg/dL (ref 10–36)
CO2: 22 mmol/L (ref 20–29)
Calcium: 10.7 mg/dL — ABNORMAL HIGH (ref 8.7–10.3)
Chloride: 104 mmol/L (ref 96–106)
Creatinine, Ser: 1.13 mg/dL — ABNORMAL HIGH (ref 0.57–1.00)
Glucose: 161 mg/dL — ABNORMAL HIGH (ref 70–99)
Potassium: 4.4 mmol/L (ref 3.5–5.2)
Sodium: 144 mmol/L (ref 134–144)
eGFR: 45 mL/min/1.73 — ABNORMAL LOW (ref >59)

## 2024-10-17 ENCOUNTER — Other Ambulatory Visit: Payer: Self-pay | Admitting: Cardiology

## 2024-10-17 DIAGNOSIS — M1990 Unspecified osteoarthritis, unspecified site: Secondary | ICD-10-CM | POA: Diagnosis not present

## 2024-10-17 DIAGNOSIS — G629 Polyneuropathy, unspecified: Secondary | ICD-10-CM | POA: Diagnosis not present

## 2024-10-17 DIAGNOSIS — R278 Other lack of coordination: Secondary | ICD-10-CM | POA: Diagnosis not present

## 2024-10-17 DIAGNOSIS — Z9181 History of falling: Secondary | ICD-10-CM | POA: Diagnosis not present

## 2024-10-19 DIAGNOSIS — M1990 Unspecified osteoarthritis, unspecified site: Secondary | ICD-10-CM | POA: Diagnosis not present

## 2024-10-19 DIAGNOSIS — G629 Polyneuropathy, unspecified: Secondary | ICD-10-CM | POA: Diagnosis not present

## 2024-10-19 DIAGNOSIS — Z9181 History of falling: Secondary | ICD-10-CM | POA: Diagnosis not present

## 2024-10-19 DIAGNOSIS — R278 Other lack of coordination: Secondary | ICD-10-CM | POA: Diagnosis not present

## 2024-10-21 NOTE — Telephone Encounter (Signed)
 Letter of results sent to pt

## 2024-10-24 ENCOUNTER — Ambulatory Visit

## 2024-10-24 DIAGNOSIS — M810 Age-related osteoporosis without current pathological fracture: Secondary | ICD-10-CM

## 2024-10-24 DIAGNOSIS — M1990 Unspecified osteoarthritis, unspecified site: Secondary | ICD-10-CM | POA: Diagnosis not present

## 2024-10-24 DIAGNOSIS — G629 Polyneuropathy, unspecified: Secondary | ICD-10-CM | POA: Diagnosis not present

## 2024-10-24 DIAGNOSIS — R278 Other lack of coordination: Secondary | ICD-10-CM | POA: Diagnosis not present

## 2024-10-24 DIAGNOSIS — Z9181 History of falling: Secondary | ICD-10-CM | POA: Diagnosis not present

## 2024-10-24 MED ORDER — DENOSUMAB 60 MG/ML ~~LOC~~ SOSY: 60.0000 mg | PREFILLED_SYRINGE | SUBCUTANEOUS | Status: DC

## 2024-10-24 NOTE — Addendum Note (Signed)
 Addended by: DIONISIO CAMELIA PARAS on: 10/24/2024 03:45 PM   Modules accepted: Orders

## 2024-10-24 NOTE — Progress Notes (Signed)
 Patient is in office today for a nurse visit for Prolia  Injection per Dr. Charlett order. Patient Injection was given in the  Right arm. Patient tolerated injection well.   Error with the MAR. Administration documented through medication management section via the orders only encounter on 09/09/2024. Medication did not come over to charge capture on this encounter.

## 2024-10-26 DIAGNOSIS — Z9181 History of falling: Secondary | ICD-10-CM | POA: Diagnosis not present

## 2024-10-26 DIAGNOSIS — G629 Polyneuropathy, unspecified: Secondary | ICD-10-CM | POA: Diagnosis not present

## 2024-10-26 DIAGNOSIS — R278 Other lack of coordination: Secondary | ICD-10-CM | POA: Diagnosis not present

## 2024-10-26 DIAGNOSIS — M1990 Unspecified osteoarthritis, unspecified site: Secondary | ICD-10-CM | POA: Diagnosis not present

## 2024-10-31 DIAGNOSIS — Z9181 History of falling: Secondary | ICD-10-CM | POA: Diagnosis not present

## 2024-10-31 DIAGNOSIS — G629 Polyneuropathy, unspecified: Secondary | ICD-10-CM | POA: Diagnosis not present

## 2024-10-31 DIAGNOSIS — R278 Other lack of coordination: Secondary | ICD-10-CM | POA: Diagnosis not present

## 2024-10-31 DIAGNOSIS — M1990 Unspecified osteoarthritis, unspecified site: Secondary | ICD-10-CM | POA: Diagnosis not present

## 2024-11-02 ENCOUNTER — Emergency Department (HOSPITAL_COMMUNITY)

## 2024-11-02 ENCOUNTER — Other Ambulatory Visit: Payer: Self-pay

## 2024-11-02 ENCOUNTER — Inpatient Hospital Stay (HOSPITAL_COMMUNITY)
Admission: EM | Admit: 2024-11-02 | Discharge: 2024-11-28 | DRG: 951 | Disposition: E | Attending: Internal Medicine | Admitting: Internal Medicine

## 2024-11-02 DIAGNOSIS — I5032 Chronic diastolic (congestive) heart failure: Secondary | ICD-10-CM | POA: Diagnosis present

## 2024-11-02 DIAGNOSIS — Z8249 Family history of ischemic heart disease and other diseases of the circulatory system: Secondary | ICD-10-CM

## 2024-11-02 DIAGNOSIS — Z952 Presence of prosthetic heart valve: Secondary | ICD-10-CM | POA: Diagnosis not present

## 2024-11-02 DIAGNOSIS — J690 Pneumonitis due to inhalation of food and vomit: Secondary | ICD-10-CM | POA: Diagnosis present

## 2024-11-02 DIAGNOSIS — R918 Other nonspecific abnormal finding of lung field: Secondary | ICD-10-CM | POA: Diagnosis not present

## 2024-11-02 DIAGNOSIS — R609 Edema, unspecified: Secondary | ICD-10-CM | POA: Diagnosis not present

## 2024-11-02 DIAGNOSIS — I10 Essential (primary) hypertension: Secondary | ICD-10-CM | POA: Diagnosis present

## 2024-11-02 DIAGNOSIS — Z515 Encounter for palliative care: Principal | ICD-10-CM

## 2024-11-02 DIAGNOSIS — Z9889 Other specified postprocedural states: Secondary | ICD-10-CM

## 2024-11-02 DIAGNOSIS — E872 Acidosis, unspecified: Secondary | ICD-10-CM | POA: Diagnosis present

## 2024-11-02 DIAGNOSIS — I2510 Atherosclerotic heart disease of native coronary artery without angina pectoris: Secondary | ICD-10-CM | POA: Diagnosis present

## 2024-11-02 DIAGNOSIS — I63523 Cerebral infarction due to unspecified occlusion or stenosis of bilateral anterior cerebral arteries: Secondary | ICD-10-CM | POA: Diagnosis present

## 2024-11-02 DIAGNOSIS — E785 Hyperlipidemia, unspecified: Secondary | ICD-10-CM | POA: Diagnosis present

## 2024-11-02 DIAGNOSIS — M810 Age-related osteoporosis without current pathological fracture: Secondary | ICD-10-CM | POA: Diagnosis present

## 2024-11-02 DIAGNOSIS — Z880 Allergy status to penicillin: Secondary | ICD-10-CM

## 2024-11-02 DIAGNOSIS — I340 Nonrheumatic mitral (valve) insufficiency: Secondary | ICD-10-CM | POA: Diagnosis present

## 2024-11-02 DIAGNOSIS — I499 Cardiac arrhythmia, unspecified: Secondary | ICD-10-CM | POA: Diagnosis not present

## 2024-11-02 DIAGNOSIS — Z888 Allergy status to other drugs, medicaments and biological substances status: Secondary | ICD-10-CM

## 2024-11-02 DIAGNOSIS — Z66 Do not resuscitate: Secondary | ICD-10-CM | POA: Diagnosis present

## 2024-11-02 DIAGNOSIS — Z823 Family history of stroke: Secondary | ICD-10-CM

## 2024-11-02 DIAGNOSIS — Z885 Allergy status to narcotic agent status: Secondary | ICD-10-CM

## 2024-11-02 DIAGNOSIS — Z91048 Other nonmedicinal substance allergy status: Secondary | ICD-10-CM

## 2024-11-02 DIAGNOSIS — Z87891 Personal history of nicotine dependence: Secondary | ICD-10-CM | POA: Diagnosis not present

## 2024-11-02 DIAGNOSIS — E039 Hypothyroidism, unspecified: Secondary | ICD-10-CM | POA: Diagnosis present

## 2024-11-02 DIAGNOSIS — Z9181 History of falling: Secondary | ICD-10-CM | POA: Diagnosis not present

## 2024-11-02 DIAGNOSIS — R4182 Altered mental status, unspecified: Secondary | ICD-10-CM | POA: Diagnosis not present

## 2024-11-02 DIAGNOSIS — R404 Transient alteration of awareness: Secondary | ICD-10-CM | POA: Diagnosis not present

## 2024-11-02 DIAGNOSIS — I4891 Unspecified atrial fibrillation: Secondary | ICD-10-CM | POA: Diagnosis not present

## 2024-11-02 DIAGNOSIS — I639 Cerebral infarction, unspecified: Secondary | ICD-10-CM | POA: Diagnosis not present

## 2024-11-02 DIAGNOSIS — I63513 Cerebral infarction due to unspecified occlusion or stenosis of bilateral middle cerebral arteries: Secondary | ICD-10-CM | POA: Diagnosis present

## 2024-11-02 DIAGNOSIS — I110 Hypertensive heart disease with heart failure: Secondary | ICD-10-CM | POA: Diagnosis present

## 2024-11-02 DIAGNOSIS — T300 Burn of unspecified body region, unspecified degree: Secondary | ICD-10-CM | POA: Diagnosis not present

## 2024-11-02 DIAGNOSIS — Z79890 Hormone replacement therapy (postmenopausal): Secondary | ICD-10-CM | POA: Diagnosis not present

## 2024-11-02 DIAGNOSIS — I480 Paroxysmal atrial fibrillation: Secondary | ICD-10-CM | POA: Diagnosis present

## 2024-11-02 DIAGNOSIS — G2581 Restless legs syndrome: Secondary | ICD-10-CM | POA: Diagnosis present

## 2024-11-02 DIAGNOSIS — J9601 Acute respiratory failure with hypoxia: Secondary | ICD-10-CM | POA: Diagnosis present

## 2024-11-02 DIAGNOSIS — G9341 Metabolic encephalopathy: Secondary | ICD-10-CM | POA: Diagnosis present

## 2024-11-02 DIAGNOSIS — R000 Tachycardia, unspecified: Secondary | ICD-10-CM | POA: Diagnosis present

## 2024-11-02 DIAGNOSIS — M1812 Unilateral primary osteoarthritis of first carpometacarpal joint, left hand: Secondary | ICD-10-CM | POA: Diagnosis not present

## 2024-11-02 DIAGNOSIS — K219 Gastro-esophageal reflux disease without esophagitis: Secondary | ICD-10-CM | POA: Diagnosis present

## 2024-11-02 DIAGNOSIS — I517 Cardiomegaly: Secondary | ICD-10-CM | POA: Diagnosis not present

## 2024-11-02 DIAGNOSIS — Z860100 Personal history of colon polyps, unspecified: Secondary | ICD-10-CM

## 2024-11-02 DIAGNOSIS — Z886 Allergy status to analgesic agent status: Secondary | ICD-10-CM

## 2024-11-02 DIAGNOSIS — Z79899 Other long term (current) drug therapy: Secondary | ICD-10-CM

## 2024-11-02 LAB — CBC
HCT: 47.9 % — ABNORMAL HIGH (ref 36.0–46.0)
Hemoglobin: 15.2 g/dL — ABNORMAL HIGH (ref 12.0–15.0)
MCH: 31.7 pg (ref 26.0–34.0)
MCHC: 31.7 g/dL (ref 30.0–36.0)
MCV: 99.8 fL (ref 80.0–100.0)
Platelets: 151 K/uL (ref 150–400)
RBC: 4.8 MIL/uL (ref 3.87–5.11)
RDW: 16.3 % — ABNORMAL HIGH (ref 11.5–15.5)
WBC: 15.4 K/uL — ABNORMAL HIGH (ref 4.0–10.5)
nRBC: 0 % (ref 0.0–0.2)

## 2024-11-02 LAB — CBG MONITORING, ED: Glucose-Capillary: 102 mg/dL — ABNORMAL HIGH (ref 70–99)

## 2024-11-02 LAB — I-STAT VENOUS BLOOD GAS, ED
Acid-base deficit: 1 mmol/L (ref 0.0–2.0)
Bicarbonate: 21.9 mmol/L (ref 20.0–28.0)
Calcium, Ion: 1.06 mmol/L — ABNORMAL LOW (ref 1.15–1.40)
HCT: 45 % (ref 36.0–46.0)
Hemoglobin: 15.3 g/dL — ABNORMAL HIGH (ref 12.0–15.0)
O2 Saturation: 99 %
Potassium: 4.8 mmol/L (ref 3.5–5.1)
Sodium: 144 mmol/L (ref 135–145)
TCO2: 23 mmol/L (ref 22–32)
pCO2, Ven: 31.2 mmHg — ABNORMAL LOW (ref 44–60)
pH, Ven: 7.454 — ABNORMAL HIGH (ref 7.25–7.43)
pO2, Ven: 115 mmHg — ABNORMAL HIGH (ref 32–45)

## 2024-11-02 LAB — COMPREHENSIVE METABOLIC PANEL WITH GFR
ALT: 39 U/L (ref 0–44)
AST: 121 U/L — ABNORMAL HIGH (ref 15–41)
Albumin: 3 g/dL — ABNORMAL LOW (ref 3.5–5.0)
Alkaline Phosphatase: 66 U/L (ref 38–126)
Anion gap: 11 (ref 5–15)
BUN: 35 mg/dL — ABNORMAL HIGH (ref 8–23)
CO2: 21 mmol/L — ABNORMAL LOW (ref 22–32)
Calcium: 8.4 mg/dL — ABNORMAL LOW (ref 8.9–10.3)
Chloride: 109 mmol/L (ref 98–111)
Creatinine, Ser: 1.42 mg/dL — ABNORMAL HIGH (ref 0.44–1.00)
GFR, Estimated: 34 mL/min — ABNORMAL LOW (ref >60)
Glucose, Bld: 131 mg/dL — ABNORMAL HIGH (ref 70–99)
Potassium: 4.8 mmol/L (ref 3.5–5.1)
Sodium: 141 mmol/L (ref 135–145)
Total Bilirubin: 1.7 mg/dL — ABNORMAL HIGH (ref 0.0–1.2)
Total Protein: 6.8 g/dL (ref 6.5–8.1)

## 2024-11-02 LAB — I-STAT CHEM 8, ED
BUN: 38 mg/dL — ABNORMAL HIGH (ref 8–23)
Calcium, Ion: 1.08 mmol/L — ABNORMAL LOW (ref 1.15–1.40)
Chloride: 113 mmol/L — ABNORMAL HIGH (ref 98–111)
Creatinine, Ser: 1.3 mg/dL — ABNORMAL HIGH (ref 0.44–1.00)
Glucose, Bld: 135 mg/dL — ABNORMAL HIGH (ref 70–99)
HCT: 46 % (ref 36.0–46.0)
Hemoglobin: 15.6 g/dL — ABNORMAL HIGH (ref 12.0–15.0)
Potassium: 4.8 mmol/L (ref 3.5–5.1)
Sodium: 144 mmol/L (ref 135–145)
TCO2: 20 mmol/L — ABNORMAL LOW (ref 22–32)

## 2024-11-02 LAB — CK: Total CK: 6005 U/L — ABNORMAL HIGH (ref 38–234)

## 2024-11-02 LAB — I-STAT CG4 LACTIC ACID, ED: Lactic Acid, Venous: 3.4 mmol/L (ref 0.5–1.9)

## 2024-11-02 MED ORDER — SODIUM CHLORIDE 0.9 % IV BOLUS
1000.0000 mL | Freq: Once | INTRAVENOUS | Status: AC
  Administered 2024-11-02: 1000 mL via INTRAVENOUS

## 2024-11-02 MED ORDER — ACETAMINOPHEN 325 MG PO TABS: 650.0000 mg | ORAL_TABLET | Freq: Four times a day (QID) | ORAL | Status: DC | PRN

## 2024-11-02 MED ORDER — ACETAMINOPHEN 650 MG RE SUPP
650.0000 mg | Freq: Four times a day (QID) | RECTAL | Status: DC | PRN
Start: 2024-11-02 — End: 2024-11-03

## 2024-11-02 MED ORDER — MORPHINE SULFATE (CONCENTRATE) 10 MG /0.5 ML PO SOLN: 10.0000 mg | ORAL | Status: DC | PRN

## 2024-11-02 MED ORDER — MORPHINE 100MG IN NS 100ML (1MG/ML) PREMIX INFUSION
1.0000 mg/h | INTRAVENOUS | Status: DC
  Administered 2024-11-02: 2 mg/h via INTRAVENOUS
  Filled 2024-11-02: qty 100

## 2024-11-02 MED ORDER — HALOPERIDOL LACTATE 2 MG/ML PO CONC: 2.0000 mg | ORAL | Status: DC | PRN

## 2024-11-02 MED ORDER — LORAZEPAM 2 MG/ML IJ SOLN: 1.0000 mg | INTRAMUSCULAR | Status: DC | PRN

## 2024-11-02 MED ORDER — GLYCOPYRROLATE 0.2 MG/ML IJ SOLN: 0.2000 mg | INTRAMUSCULAR | Status: DC | PRN

## 2024-11-02 MED ORDER — ONDANSETRON 4 MG PO TBDP: 4.0000 mg | ORAL_TABLET | Freq: Four times a day (QID) | ORAL | Status: DC | PRN

## 2024-11-02 MED ORDER — GLYCOPYRROLATE 0.2 MG/ML IJ SOLN
0.2000 mg | INTRAMUSCULAR | Status: DC | PRN
Start: 2024-11-02 — End: 2024-11-03

## 2024-11-02 MED ORDER — HALOPERIDOL LACTATE 5 MG/ML IJ SOLN: 2.0000 mg | INTRAMUSCULAR | Status: DC | PRN

## 2024-11-02 MED ORDER — SODIUM CHLORIDE 0.9% FLUSH: 3.0000 mL | Freq: Two times a day (BID) | INTRAVENOUS | Status: DC

## 2024-11-02 MED ORDER — HALOPERIDOL LACTATE 2 MG/ML PO CONC: 0.5000 mg | ORAL | Status: DC | PRN

## 2024-11-02 MED ORDER — GLYCOPYRROLATE 1 MG PO TABS: 1.0000 mg | ORAL_TABLET | ORAL | Status: DC | PRN

## 2024-11-02 MED ORDER — ONDANSETRON HCL 4 MG/2ML IJ SOLN: 4.0000 mg | Freq: Four times a day (QID) | INTRAMUSCULAR | Status: DC | PRN

## 2024-11-02 MED ORDER — MORPHINE BOLUS VIA INFUSION: 1.0000 mg | INTRAVENOUS | Status: DC | PRN

## 2024-11-02 MED ORDER — LORAZEPAM 2 MG/ML PO CONC
1.0000 mg | ORAL | Status: DC | PRN
Start: 2024-11-02 — End: 2024-11-03

## 2024-11-02 MED ORDER — MORPHINE SULFATE (PF) 2 MG/ML IV SOLN
2.0000 mg | Freq: Once | INTRAVENOUS | Status: AC
  Administered 2024-11-02: 2 mg via INTRAVENOUS
  Filled 2024-11-02: qty 1

## 2024-11-02 MED ORDER — METOPROLOL TARTRATE 5 MG/5ML IV SOLN: 5.0000 mg | INTRAVENOUS | Status: DC | PRN

## 2024-11-02 MED ORDER — ALBUTEROL SULFATE (2.5 MG/3ML) 0.083% IN NEBU: 2.5000 mg | INHALATION_SOLUTION | RESPIRATORY_TRACT | Status: DC | PRN

## 2024-11-02 MED ORDER — POLYVINYL ALCOHOL 1.4 % OP SOLN: 1.0000 [drp] | Freq: Four times a day (QID) | OPHTHALMIC | Status: DC | PRN

## 2024-11-02 MED ORDER — HALOPERIDOL 0.5 MG PO TABS: 0.5000 mg | ORAL_TABLET | ORAL | Status: DC | PRN

## 2024-11-02 MED ORDER — SODIUM CHLORIDE 0.9 % IV SOLN: 250.0000 mL | INTRAVENOUS | Status: DC | PRN

## 2024-11-02 MED ORDER — HALOPERIDOL LACTATE 5 MG/ML IJ SOLN: 0.5000 mg | INTRAMUSCULAR | Status: DC | PRN

## 2024-11-02 MED ORDER — HALOPERIDOL 1 MG PO TABS: 2.0000 mg | ORAL_TABLET | ORAL | Status: DC | PRN

## 2024-11-02 MED ORDER — LORAZEPAM 1 MG PO TABS: 1.0000 mg | ORAL_TABLET | ORAL | Status: DC | PRN

## 2024-11-02 MED ORDER — SODIUM CHLORIDE 0.9% FLUSH: 3.0000 mL | INTRAVENOUS | Status: DC | PRN

## 2024-11-03 NOTE — Progress Notes (Addendum)
 Patient family came to the nurses station to have the nurse look at the patient because they believe she had took her last breathe. I went and assess the patient, listen to her heart and heard two beats and nothing afterwards. I listen for a full minute and did not hear a heartbeat, listen for breathe sounds and nothing was heard. Vitals were attempted as well by the tech and was unable to get vitals. I had Tameeka, Charge Nurse come to the patient room to assess the patient as well, she was unable to hear breathe sounds or a heart beat. The provider was informed and she placed an order for the Nurse to pronounce death. Patient expired at 1955 prior to any shift assessment being done. Family was at bedside and took her belongings with them when they left. Patient was taken to the morgue @0200 .

## 2024-11-28 NOTE — ED Provider Notes (Signed)
 [place] EMERGENCY DEPARTMENT AT [facility] Provider Note   CSN: [id number] Arrival date & time: 2024/11/06  1441     Patient presents with: No chief complaint on file.   Amy Fischer is a 89 y.o. female.   This is a 89 year old female presenting emergency department by EMS unresponsive.  Patient was last seen 2 days ago.  Neighbors found her today sitting in her chair with labored breathing and altered.  Patient GCS of 6.  She will move her lower extremity spontaneously and to noxious stimuli.  No movement to the upper extremities.  Labored breathing        Prior to Admission medications   Medication Sig Start Date End Date Taking? Authorizing Provider  Acetaminophen  (TYLENOL  PO) Take 1-2 tablets by mouth 2 (two) times daily as needed (pain, headache, fever).    [provider]  Cholecalciferol  (VITAMIN D -3 PO) Take 1 capsule by mouth daily.    [provider]  Cyanocobalamin  (VITAMIN B-12 PO) Take 1 tablet by mouth daily.    [provider]  ezetimibe  (ZETIA ) 10 MG tablet TAKE 1 TABLET DAILY Patient taking differently: Take 10 mg by mouth daily. Patient takes half tab 10/12/24   Panosh, Wanda K, MD  furosemide  (LASIX ) 20 MG tablet Take 1 tablet (20 mg total) by mouth 2 (two) times daily. Patient taking differently: Take 20 mg by mouth See admin instructions. Take 1 tablet (20mg ) by mouth in the morning with breakfast and take 1 tablet at 1500. 07/09/24   Rosario [name], MD  levothyroxine  (SYNTHROID ) 100 MCG tablet TAKE 1 TABLET EVERY MORNING ON AN EMPTY STOMACH 10/14/23   Webb, Padonda B, FNP  melatonin 5 MG TABS Take 5 mg by mouth at bedtime. May increase to 10 mg as needed for sleep    [provider]  metoprolol  tartrate (LOPRESSOR ) 25 MG tablet Take 1 tablet (25 mg total) by mouth 2 (two) times daily. 07/09/24   Rosario [name], MD  midodrine  (PROAMATINE ) 5 MG tablet Take 5 mg by mouth 3 (three) times daily with  meals. Patient taking differently: Take 5 mg by mouth 3 (three) times daily with meals. Patient takes PRN    [provider]  mirtazapine (REMERON) 15 MG tablet Take 15 mg by mouth at bedtime. Patient taking differently: Take 15 mg by mouth at bedtime. Patient takes half tab 08/27/24   [provider]  Polyethyl Glycol-Propyl Glycol (SYSTANE OP) Place 1 drop into both eyes 2 (two) times daily.    [provider]  rOPINIRole  (REQUIP ) 0.25 MG tablet TAKE 1 TABLET AT BEDTIME FOR RESTLESS LEG 10/12/24   Panosh, [name], MD  rosuvastatin  (CRESTOR ) 5 MG tablet TAKE ONE-HALF (1/2) TABLET DAILY 10/12/24   Panosh, Wanda K, MD  senna (SENOKOT) 8.6 MG TABS tablet Take 2 tablets by mouth daily. Hold if having loose stools    [provider]  traMADol  (ULTRAM ) 50 MG tablet Take 50 mg by mouth 3 (three) times daily. Patient not taking: Reported on 10/13/2024    [provider]    Allergies: Actonel [risedronate sodium], Bayer aspirin  [aspirin ], Codeine, Statins, Augmentin  [amoxicillin -pot clavulanate], and Tape    Review of Systems  Updated Vital Signs BP (!) 129/92   Pulse (!) 140   Temp (!) 96.7 F (35.9 C) (Temporal)   Resp (!) 38   Ht 5' 1 (1.549 m)   Wt 44.9 kg   SpO2 91%   BMI 18.71 kg/m  Physical Exam Vitals and nursing note reviewed.  Constitutional:      Comments: Ill-appearing.  Dry mucous membranes  HENT:     Head: Normocephalic and atraumatic.     Mouth/Throat:     Comments: Dry mucous membranes. Eyes:     Comments: Pupils midrange, minimally reactive to light, but equal size.  Cardiovascular:     Rate and Rhythm: Tachycardia present. Rhythm irregular.  Pulmonary:     Effort: Respiratory distress present.  Abdominal:     General: There is no distension.  Musculoskeletal:     Comments: Patient with laceration to her left wrist.  Appears old.  Also has skin tear/abrasion to her right anterior shin.  Neurological:     Comments:  GCS of 6.  Moving lower extremities to noxious stimuli, but no movement in upper extremities.  Psychiatric:     Comments: Not possible to assess     (all labs ordered are listed, but only abnormal results are displayed) Labs Reviewed  CBC - Abnormal; Notable for the following components:      Result Value   WBC 15.4 (*)    Hemoglobin 15.2 (*)    HCT 47.9 (*)    RDW 16.3 (*)    All other components within normal limits  I-STAT VENOUS BLOOD GAS, ED - Abnormal; Notable for the following components:   pH, Ven 7.454 (*)    pCO2, Ven 31.2 (*)    pO2, Ven 115 (*)    Calcium , Ion 1.06 (*)    Hemoglobin 15.3 (*)    All other components within normal limits  I-STAT CHEM 8, ED - Abnormal; Notable for the following components:   Chloride 113 (*)    BUN 38 (*)    Creatinine, Ser 1.30 (*)    Glucose, Bld 135 (*)    Calcium , Ion 1.08 (*)    TCO2 20 (*)    Hemoglobin 15.6 (*)    All other components within normal limits  I-STAT CG4 LACTIC ACID, ED - Abnormal; Notable for the following components:   Lactic Acid, Venous 3.4 (*)    All other components within normal limits  CBG MONITORING, ED - Abnormal; Notable for the following components:   Glucose-Capillary 102 (*)    All other components within normal limits  COMPREHENSIVE METABOLIC PANEL WITH GFR  CK    EKG: EKG Interpretation Date/Time:  Wednesday Nov 22, 2024 14:42:47 EST Ventricular Rate:  139 PR Interval:    QRS Duration:  89 QT Interval:  334 QTC Calculation: 508 R Axis:   91  Text Interpretation: Atrial fibrillation with rapid V-rate Right axis deviation Consider left ventricular hypertrophy Borderline T abnormalities, inferior leads Confirmed by Neysa Clap [phone]) on 11/22/2024 3:02:12 PM  Radiology: CT Head Wo Contrast Result Date: 11-22-24 EXAM: CT HEAD WITHOUT CONTRAST 2024/11/22 03:25:00 PM TECHNIQUE: CT of the head was performed without the administration of intravenous contrast. Automated exposure  control, iterative reconstruction, and/or weight based adjustment of the mA/kV was utilized to reduce the radiation dose to as low as reasonably achievable. COMPARISON: Head CT 07/14/2024. CLINICAL HISTORY: Mental status change, unknown cause. FINDINGS: BRAIN AND VENTRICLES: There is new extensive cytotoxic edema throughout both cerebral hemispheres involving essentially the entirety of the MCA and ACA territories with sparing of the PCA territories. There is associated sulcal effacement and mild mass effect on the ventricles without significant midline shift. There is also a suspected small acute or subacute infarct inferiorly in the right cerebellar hemisphere. Heterogeneous appearance  of the pons and lower midbrain may be secondary to skull base streak artifact although ischemia is not excluded. No intracranial hemorrhage or extra-axial fluid collection is identified. There is a hyperdense appearance of the carotid termini and proximal MCAs bilaterally. Calcified atherosclerosis at the skull base. ORBITS: Bilateral cataract extraction. SINUSES: No acute abnormality. SOFT TISSUES AND SKULL: No acute soft tissue abnormality. No skull fracture. Findings were communicated by telephone to Dr. Neysa on Nov 12, 2024 at 3:40 pm. IMPRESSION: 1. Large acute/subacute bilateral MCA and ACA infarcts. Associated mass effect without midline shift or hemorrhage. 2. Suspected small acute or subacute infarct in the inferior right cerebellar hemisphere. Electronically signed by: Dasie Hamburg MD 2024/11/12 03:44 PM EST RP Workstation: [id number]     .Critical Care  Performed by: Neysa Caron PARAS, DO Authorized by: Neysa Caron PARAS, DO   Critical care provider statement:    Critical care time (minutes):  30   Critical care was necessary to treat or prevent imminent or life-threatening deterioration of the following conditions:  CNS failure or compromise and respiratory failure   Critical care was time spent personally by me on  the following activities:  Development of treatment plan with patient or surrogate, discussions with consultants, evaluation of patient's response to treatment, examination of patient, ordering and review of laboratory studies, ordering and review of radiographic studies, ordering and performing treatments and interventions, pulse oximetry, re-evaluation of patient's condition and review of old charts    Medications Ordered in the ED  sodium chloride  0.9 % bolus 1,000 mL (1,000 mLs Intravenous New Bag/Given 11/12/24 1541)    Clinical Course as of 2024-11-12 1556  Wed 11-12-2024  1506 I did discuss goals of care with daughter.  She confirmed that mother is DNR/DNI.  Discussed my concern for airway compromise/aspiration.  Patient is somnolent and altered.  BiPAP contraindicated. [TY]  1514 Patient's daughter presented to bedside.  Has not been able to reach her brother, but is going to keep trying as she thinks her new siblings would want comfort/care/measures at this point.  She is agreeable to labs and imaging.  [TY]  1539 Call from radiology with large subacute stroke involving the Mca/aca territory. With edema, but no midline shift.  [TY]  1546 Updated daughter on CT scan results.  She stated she would like to go forward with comfort measures at this point. Will admit to hospitalist.  [TY]  1555 Spoke with the hospitalist regarding admission. [TY]    Clinical Course User Index [TY] Neysa Caron PARAS, DO                                 Medical Decision Making This is a 89 year old with heart failure, A-fib, carotid artery stenosis and hyperlipidemia who presented to the emergency department for altered mental status/unresponsive.  EMS reported A-fib, also assisted with bagging, but patient noted to be taking spontaneous respirations.  She has a GCS of 6, concern for aspiration/airway protection at this point.  However she is a DNR/DNI which was confirmed by family.  She is tachypneic, labored  breathing maintaining saturation in the low 90s on nonrebreather.  She does have elevated heart rate, will temperature and elevated lactate and leukocytosis.  Discussed antibiotics, but family would like to forego.  CT head concerning for large stroke.  Family wanting to proceed with comfort measures.  Amount and/or Complexity of Data Reviewed Independent Historian:     Details:  EMS reports last known normal roughly 2 days ago External Data Reviewed:     Details: Fall in July was on Eliquis  at that time, but taken off. Labs: ordered. Decision-making details documented in ED Course.    Details: CIWA Radiology: ordered and independent interpretation performed.    Details: No obvious hemorrhage, but does have some diffuse ischemic changes  Risk Decision regarding hospitalization. Decision not to resuscitate or to de-escalate care because of poor prognosis. Diagnosis or treatment significantly limited by social determinants of health.        Final diagnoses:  Cerebrovascular accident (CVA), unspecified mechanism [facility])    ED Discharge Orders     None          Neysa Caron PARAS, DO 13-Nov-2024 1550

## 2024-11-28 NOTE — ED Notes (Signed)
 Pads applied.

## 2024-11-28 NOTE — ED Notes (Signed)
 Floor called to notify that patient is coming up

## 2024-11-28 NOTE — Death Summary Note (Signed)
 DEATH SUMMARY   Patient Details  Name: Amy Fischer MRN: [id number] DOB: 1929/02/20 ERE:Ejwndy, [name], MD Admission/Discharge Information   Admit Date:  11/13/2024  Date of Death: Date of Death: 11-13-24  Time of Death: Time of Death: 1955  Length of Stay: 1   Principle Cause of death: Acute ischemic CVA  Hospital Diagnoses: Principal Problem:   Acute CVA (cerebrovascular accident) [facility]) Active Problems:   Chronic diastolic CHF (congestive heart failure), NYHA class 2 (HCC)   Essential hypertension   S/P MVR (mitral valve repair)   Paroxysmal atrial fibrillation (HCC)   Severe mitral regurgitation   PAF (paroxysmal atrial fibrillation) (HCC)   Acute hypoxic respiratory failure [facility])   Hospital Course: 89 year old female with history of PAF (anticoagulation recently discontinued by cardiology due to significant fall risk), chronic HFpEF, nonobstructive CAD, s/p mitral valve repair who was found unresponsive at home by home health physical therapist.  EMS was called-patient was brought to the hospital-she was tachycardic with A-fib RVR, profoundly hypoxic and accumulating secretions.  She was requiring 100% nonrebreather mask.  CT head showed extensive acute/subacute bilateral MCA/ACA infarcts.  She was subsequently admitted to the hospitalist service-this MD-had long discussion with patient's daughter and son at bedside-all understood tenuous/critical condition-and that patient was not protecting her airway and was actively aspirating in a setting of significant CVA.  Per patient's prior directives-she was already a DNR-and family did not want her suffering.  Family agreed to initiate comfort measures-she was started on a morphine  infusion-along with other supportive/comfort measures.  She passed away the same day of hospitalization/admission at 1955 hrs.  Assessment and Plan: Acute hypoxic respiratory failure Actively aspirating on my evaluation-accumulating secretions and  unable to control airway.  She was requiring 100% nonrebreather mask As noted above-after discussion with family she was started on morphine  infusion and full comfort measures.  Acute CVA Likely embolic-given history of A-fib and the fact that her primary cardiologist had discontinued anticoagulation several weeks prior to this admission due to significant fall risk/frequent falling. Since patient/family elected to transition to full comfort measures-no further workup or neurology consultation was sought.  Acute folic encephalopathy This was thought to be secondary to hypoxemia/CVA  History of nonobstructive CAD S/p mitral valve repair Chronic HFpEF Managed with supportive/comfort measures.   The results of significant diagnostics from this hospitalization (including imaging, microbiology, ancillary and laboratory) are listed below for reference.   Significant Diagnostic Studies: DG Chest Portable 1 View Result Date: November 13, 2024 EXAM: 1 VIEW(S) XRAY OF THE CHEST November 13, 2024 04:01:00 PM COMPARISON: Previous exams are available. CLINICAL HISTORY: AMS FINDINGS: LUNGS AND PLEURA: Increased left basilar opacity is noted concerning for pneumonia or atelectasis with effusion. No pulmonary edema. No pneumothorax. HEART AND MEDIASTINUM: Status post cardiac valve repair stable cardiomegaly. BONES AND SOFT TISSUES: No acute osseous abnormality. IMPRESSION: 1. Left basilar opacity, likely atelectasis or pneumonia  with effusion. 2. Stable cardiomegaly. Electronically signed by: Lynwood Seip MD 2024/11/13 04:12 PM EST RP Workstation: [id number]   DG Wrist Complete Left Result Date: 11-13-2024 EXAM: 3 OR MORE VIEW(S) XRAY OF THE LEFT WRIST 11-13-2024 04:01:00 PM COMPARISON: Previous exams are available. CLINICAL HISTORY: AMS FINDINGS: BONES AND JOINTS: No acute fracture. No focal osseous lesion. No joint dislocation. Severe degenerative changes are seen involving the first carpometacarpal joint as well as  multiple interphalangeal joints. SOFT TISSUES: The soft tissues are unremarkable. IMPRESSION: 1. Severe osteoarthritis of the first carpometacarpal joint and multiple interphalangeal joints. Electronically signed by: Lynwood Seip MD  November 20, 2024 04:10 PM EST RP Workstation: [id number]   DG Tibia/Fibula Right Result Date: 20-Nov-2024 EXAM: 2 VIEW(S) XRAY OF THE RIGHT TIBIA AND FIBULA November 20, 2024 04:01:00 PM COMPARISON: None available. CLINICAL HISTORY: AMS FINDINGS: BONES AND JOINTS: No acute fracture. No focal osseous lesion. No joint dislocation. SOFT TISSUES: The soft tissues are unremarkable. IMPRESSION: 1. No significant abnormality. Electronically signed by: Lynwood Seip MD 11/20/24 04:08 PM EST RP Workstation: [id number]   CT Head Wo Contrast Result Date: Nov 20, 2024 EXAM: CT HEAD WITHOUT CONTRAST 11/20/24 03:25:00 PM TECHNIQUE: CT of the head was performed without the administration of intravenous contrast. Automated exposure control, iterative reconstruction, and/or weight based adjustment of the mA/kV was utilized to reduce the radiation dose to as low as reasonably achievable. COMPARISON: Head CT 07/14/2024. CLINICAL HISTORY: Mental status change, unknown cause. FINDINGS: BRAIN AND VENTRICLES: There is new extensive cytotoxic edema throughout both cerebral hemispheres involving essentially the entirety of the MCA and ACA territories with sparing of the PCA territories. There is associated sulcal effacement and mild mass effect on the ventricles without significant midline shift. There is also a suspected small acute or subacute infarct inferiorly in the right cerebellar hemisphere. Heterogeneous appearance of the pons and lower midbrain may be secondary to skull base streak artifact although ischemia is not excluded. No intracranial hemorrhage or extra-axial fluid collection is identified. There is a hyperdense appearance of the carotid termini and proximal MCAs bilaterally. Calcified atherosclerosis at  the skull base. ORBITS: Bilateral cataract extraction. SINUSES: No acute abnormality. SOFT TISSUES AND SKULL: No acute soft tissue abnormality. No skull fracture. Findings were communicated by telephone to Dr. Neysa on Nov 20, 2024 at 3:40 pm. IMPRESSION: 1. Large acute/subacute bilateral MCA and ACA infarcts. Associated mass effect without midline shift or hemorrhage. 2. Suspected small acute or subacute infarct in the inferior right cerebellar hemisphere. Electronically signed by: Dasie Hamburg MD 11-20-2024 03:44 PM EST RP Workstation: [id number]   DG Ribs Unilateral W/Chest Right Result Date: 10/12/2024 CLINICAL DATA:  Right rib pain after a fall. EXAM: RIGHT RIBS AND CHEST - 3+ VIEW COMPARISON:  Chest x-ray 07/06/2024. FINDINGS: Chronic posttraumatic deformity right humeral neck comparing to right shoulder films from 07/14/2024. Lungs are hyperexpanded. Calcified granulomata noted in the left upper lung as confirmed on CT of 12/28/2022. Atelectasis or scarring noted in the lung bases. No evidence for pneumothorax or pleural effusion. Chronic posterior left rib fracture evident. Oblique views of the right ribs show a possible but not definite nondisplaced fracture of the anterior right sixth rib. No other evidence for an acute displaced right-sided rib fracture. IMPRESSION: 1. Possible but not definite nondisplaced fracture of the anterior right sixth rib. No other evidence for an acute displaced right-sided rib fracture. 2. Chronic posttraumatic deformity right humeral neck. 3. Old posterior left rib fracture. Electronically Signed   By: Camellia Candle M.D.   On: 10/12/2024 14:01    Microbiology: No results found for this or any previous visit (from the past 240 hours).  Time spent: 35 minutes  Signed: Donalda Applebaum, MD 2024-11-20

## 2024-11-28 NOTE — ED Notes (Signed)
 Per MD Neysa and PA Lynwood, patient DNR DNI per daughter.

## 2024-11-28 NOTE — H&P (Signed)
 History and Physical    Patient: Amy Fischer FMW:[id number] DOB: 1929/09/13 DOA: Nov 03, 2024 DOS: the patient was seen and examined on November 03, 2024 PCP: Charlett [name], MD  Patient coming from: Home  Chief Complaint: Found unresponsive at home   HPI: Amy Fischer is a 89 y.o. female with medical history significant of PAF, s/p mitral valve repair, HLD, chronic HFpEF, nonobstructive CAD-who was brought to the ED after being found unresponsive at home by a physical therapist.  Due to-patient is unable to provide history-most of this history is obtained after reviewing ED chart and speaking with the patient's son/daughter at bedside.  Patient was last seen normal yesterday evening (both daughter/son spoke to her over the phone)-she was found around 10 AM this morning by her physical therapist-unresponsive-she apparently was in a recliner.  She was noted to have bruising/skin tears and her right leg.  She appeared to be in some respiratory distress.  EMS was called-she was hypoxic-cool to touch-required NRB and intermittent bagging while enroute to the ED.  Heart rate remained in the 150s.  She was subsequently brought to the ED-where CT imaging revealed extensive acute/subacute bilateral MCA/ACA infarcts.  She was also noted to be accumulating secretions and actively aspirating.  During my evaluation-she was on 100% NRB mask-and O2 saturations was only in 80s range.  Family had discussed with ED MD-and indicated that they would want to proceed with end-of-life/comfort care.  Prior hospitalist was asked to admit this patient.  During my evaluation-daughter/son at bedside-patient is overtly aspirating secretions at this point-on NRB-and is clearly suffering-discussed with both son/daughter at bedside-both understand that patient is actively dying at this point-and are agreeable to start comfort measures as soon as possible.    Review of Systems: Unable to review all systems due to lack of  cooperation from patient. Past Medical History:  Diagnosis Date   Allergy    Anemia    Asymptomatic carotid artery stenosis    R ICA 40% stenosis on CTA 12/2011.   Bladder polyps    Cataract    BILATERAL-REMOVED   Chronic heart failure with preserved ejection fraction (HFpEF) (HCC)    Diverticulosis    Episodic recurrent vertigo    MRI Head 2003   Esophageal spasm    GERD (gastroesophageal reflux disease)    Hepatic hemangioma    History of hepatitis    unknown type   Hyperlipidemia    Hyperplastic colon polyp    Hypothyroidism    Intestinal metaplasia of gastric mucosa    Osteoarthritis    Osteoporosis    Patellar fracture 07/13/2012   Scapular fracture 03/31/2012   small from direct blow with trip     Subclavian steal syndrome    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; CTA 2013 revealed open graft   Past Surgical History:  Procedure Laterality Date   APPENDECTOMY     CARDIAC CATHETERIZATION N/A 10/04/2015   Procedure: Right/Left Heart Cath and Coronary Angiography;  Surgeon: Victory LELON Sharps, MD;  Location: [facility] INVASIVE CV LAB;  Service: Cardiovascular;  Laterality: N/A;   CAROTID-SUBCLAVIAN BYPASS GRAFT Left 1999   for Franklin steal syndrome   CHOLECYSTECTOMY     COLONOSCOPY     CYSTECTOMY Left    hand   ELBOW SURGERY Left    KNEE SURGERY Left 2014   MITRAL VALVE REPAIR N/A 10/10/2015   Procedure: MITRAL VALVE REPAIR (MVR) WITH SIZE 28 CARPENTIER-EDWARDS PHYSIO II ANNULOPLASTY RING;  Surgeon: Elspeth JAYSON Millers, MD;  Location: [facility] OR;  Service: Open Heart Surgery;  Laterality: N/A;   ROTATOR CUFF REPAIR Left    TEAR DUCT PROBING  07/2014   TEE WITHOUT CARDIOVERSION N/A 10/03/2015   Procedure: TRANSESOPHAGEAL ECHOCARDIOGRAM (TEE);  Surgeon: Ezra GORMAN Shuck, MD;  Location[facility] ENDOSCOPY;  Service: Cardiovascular;  Laterality: N/A;   TEE WITHOUT CARDIOVERSION N/A 10/10/2015   Procedure: TRANSESOPHAGEAL ECHOCARDIOGRAM (TEE);  Surgeon: Elspeth JAYSON Millers, MD;  Location:  [facility] OR;  Service: Open Heart Surgery;  Laterality: N/A;   TONSILLECTOMY     TUBAL LIGATION     Social History:  reports that she quit smoking about 33 years ago. Her smoking use included cigarettes. She started smoking about 53 years ago. She has a 15 pack-year smoking history. She has never used smokeless tobacco. She reports that she does not currently use alcohol  after a past usage of about 4.0 standard drinks of alcohol  per week. She reports that she does not use drugs.  Allergies  Allergen Reactions   Actonel [Risedronate Sodium] Other (See Comments)    Upset stomach  OK to take Fosamax    Bayer Aspirin  [Aspirin ] Other (See Comments)    Told to avoid due to Eliquis  use.   Codeine Nausea And Vomiting   Statins Other (See Comments)    Myalgias   OK to take Crestor  2.5mg  QD.    Augmentin  [Amoxicillin -Pot Clavulanate] Diarrhea   Tape Other (See Comments)    Skin blisters  OK to use paper tape    Family History  Problem Relation Age of Onset   Hypertension Mother    Stroke Mother    Heart disease Father    Colon cancer Neg Hx    Neurofibromatosis Neg Hx    Allergic rhinitis Neg Hx    Asthma Neg Hx    Angioedema Neg Hx    Atopy Neg Hx    Eczema Neg Hx    Immunodeficiency Neg Hx    Urticaria Neg Hx     Prior to Admission medications   Medication Sig Start Date End Date Taking? Authorizing Provider  Acetaminophen  (TYLENOL  PO) Take 1-2 tablets by mouth 2 (two) times daily as needed (pain, headache, fever).    [provider]  Cholecalciferol  (VITAMIN D -3 PO) Take 1 capsule by mouth daily.    [provider]  Cyanocobalamin  (VITAMIN B-12 PO) Take 1 tablet by mouth daily.    [provider]  ezetimibe  (ZETIA ) 10 MG tablet TAKE 1 TABLET DAILY Patient taking differently: Take 10 mg by mouth daily. Patient takes half tab 10/12/24   Panosh, Wanda K, MD  furosemide  (LASIX ) 20 MG tablet Take 1 tablet (20 mg total) by mouth 2 (two) times daily. Patient  taking differently: Take 20 mg by mouth See admin instructions. Take 1 tablet (20mg ) by mouth in the morning with breakfast and take 1 tablet at 1500. 07/09/24   Rosario Eland I, MD  levothyroxine  (SYNTHROID ) 100 MCG tablet TAKE 1 TABLET EVERY MORNING ON AN EMPTY STOMACH 10/14/23   Webb, Padonda B, FNP  melatonin 5 MG TABS Take 5 mg by mouth at bedtime. May increase to 10 mg as needed for sleep    [provider]  metoprolol  tartrate (LOPRESSOR ) 25 MG tablet Take 1 tablet (25 mg total) by mouth 2 (two) times daily. 07/09/24   Rosario Eland FERNS, MD  midodrine  (PROAMATINE ) 5 MG tablet Take 5 mg by mouth 3 (three) times daily with meals. Patient taking differently: Take 5 mg by mouth 3 (three)  times daily with meals. Patient takes PRN    [provider]  mirtazapine (REMERON) 15 MG tablet Take 15 mg by mouth at bedtime. Patient taking differently: Take 15 mg by mouth at bedtime. Patient takes half tab 08/27/24   [provider]  Polyethyl Glycol-Propyl Glycol (SYSTANE OP) Place 1 drop into both eyes 2 (two) times daily.    [provider]  rOPINIRole  (REQUIP ) 0.25 MG tablet TAKE 1 TABLET AT BEDTIME FOR RESTLESS LEG 10/12/24   Panosh, Wanda K, MD  rosuvastatin  (CRESTOR ) 5 MG tablet TAKE ONE-HALF (1/2) TABLET DAILY 10/12/24   Panosh, Wanda K, MD  senna (SENOKOT) 8.6 MG TABS tablet Take 2 tablets by mouth daily. Hold if having loose stools    [provider]  traMADol  (ULTRAM ) 50 MG tablet Take 50 mg by mouth 3 (three) times daily. Patient not taking: Reported on 10/13/2024    [provider]    Physical Exam: Vitals:   2024/11/25 1455 11-25-24 1456 25-Nov-2024 1457 11/25/24 1500  BP: (!) 129/92  (!) 129/92   Pulse: (!) 137  (!) 140   Resp: (!) 39  (!) 38   Temp:    (!) 96.7 F (35.9 C)  TempSrc:    Temporal  SpO2: 91%  92% 91%  Weight:  44.9 kg    Height:  5' 1 (1.549 m)     Gen Exam: In some distress-tachypneic-gurgling-on 100% NRB.  Heart  rate in the 150s. HEENT:atraumatic, normocephalic Chest: Transmitted upper airway sounds in all lung zones. CVS:S1S2 tachycardic Abdomen:soft non tender, non distended Extremities:no edema Neurology: Difficult exam-not following commands-appears to have generalized weakness but clearly has left upper/lower extremity weakness more than right. Appears to have a small skin tear/laceration left wrist, skin tear/abrasion to right skin.  Data Reviewed:    Latest Ref Rng & Units 11-25-2024    2:54 PM Nov 25, 2024    2:50 PM 09/02/2024    2:52 PM  CBC  WBC 4.0 - 10.5 K/uL  15.4  8.1   Hemoglobin 12.0 - 15.0 g/dL 87.9 - 84.9 g/dL 84.6    84.3  84.7  86.1   Hematocrit 36.0 - 46.0 % 36.0 - 46.0 % 45.0    46.0  47.9  43.0   Platelets 150 - 400 K/uL  151  169.0         Latest Ref Rng & Units 2024-11-25    2:54 PM 10/13/2024    4:22 PM 09/02/2024    2:52 PM  BMP  Glucose 70 - 99 mg/dL 864  838  76   BUN 8 - 23 mg/dL 38  21  27   Creatinine 0.44 - 1.00 mg/dL 8.69  8.86  8.83   BUN/Creat Ratio 12 - 28  19    Sodium 135 - 145 mmol/L 135 - 145 mmol/L 144    144  144  145   Potassium 3.5 - 5.1 mmol/L 3.5 - 5.1 mmol/L 4.8    4.8  4.4  4.1   Chloride 98 - 111 mmol/L 113  104  103   CO2 20 - 29 mmol/L  22  32   Calcium  8.7 - 10.3 mg/dL  89.2  89.8      Twelve-lead EKG: A-fib RVR  Assessment and Plan: Acute hypoxic respiratory failure Likely secondary to aspiration pneumonia On 100% NRB-actively accumulating secretions and not able to protect airway-gurgling Discussed with family-full comfort care-starting morphine  infusion Discussed with RN-once morphine  started-remove NRB-and placed on minimal amount of  oxygen on room air. No role for antibiotics in the setting  Acute CVA Likely embolic Recently seen by cardiology-given frequent falls-anticoagulation was discontinued Last seen normal yesterday evening-found 10 AM this morning by physical therapist-altered/unresponsive Active  dying/aspirating-family okay with comfort measures-no stroke workup indicated at this point.  Being admitted for hospice/comfort measures/end-of-life care.  No role for neurology evaluation at this point.  Acute metabolic encephalopathy Secondary to hypoxemia/CVA Supportive care/end-of-life care/comfort care planned  A-fib with RVR Heart rate in the 150s As needed IV metoprolol  for comfort No longer on anticoagulation-recently stopped by cardiology given frequent falls. No need for telemetry monitoring-as comfort care  History of nonobstructive CAD S/p mitral valve repair Chronic HFpEF Supportive care-comfort care in progress No role for antiplatelet/statins in the setting.  Palliative care DNR already in place Discussed with son-daughter (retired ED RN) at bedside-patient appears to be critically ill-severely hypoxemic-actively aspirating-and likely actively dying. Family agreeable to initiate full comfort measures including morphine  infusion to alleviate air hunger/tachypnea.  Family in the process of calling patient's son who is in [place] -but are okay with initiating comfort measures given how critically ill-with air hunger she is currently. Suspect not stable to transfer to residential hospice-suspect inpatient hospital death in a matter for few hours-1-2 days   Advance Care Planning:   Code Status: Do not attempt resuscitation (DNR) - Comfort care   Consults: None  Family Communication: Son/daughter at bedside  Severity of Illness: The appropriate patient status for this patient is INPATIENT. Inpatient status is judged to be reasonable and necessary in order to provide the required intensity of service to ensure the patient's safety. The patient's presenting symptoms, physical exam findings, and initial radiographic and laboratory data in the context of their chronic comorbidities is felt to place them at high risk for further clinical deterioration. Furthermore, it is  not anticipated that the patient will be medically stable for discharge from the hospital within 2 midnights of admission.   * I certify that at the point of admission it is my clinical judgment that the patient will require inpatient hospital care spanning beyond 2 midnights from the point of admission due to high intensity of service, high risk for further deterioration and high frequency of surveillance required.*  Author: Donalda Applebaum, MD 11/05/2024 4:37 PM  For on call review [email or web address].

## 2024-11-28 NOTE — ED Triage Notes (Signed)
 Patient arrives via [facility] unresponsive. Patient was found by neighbors sitting in her chair, breathing funny. Patient was lowered to floor in dining room. Patient found next to heater, lac to left hand. Upon EMS, patient had weak radials, in afib rate 100-150 with hx of same. Patient in c-collar for precaution. Cold to touch. NPA placed en route. Per EMS, bagging but patient breathing spontaneously. LKW 2 days ago when home health nurse saw patient. Per EMS, patient with decreased breath sounds on left and clear in right. Patient was moving legs with EMS. Unable to follow commands.   EMS vitals 140 automatic but no manual or palp BP HR 100-150 CBG 153  18 RAC with 200cc fluid en route

## 2024-11-28 NOTE — ED Notes (Signed)
 Per hospitalist, remove NRB after morphine  admin. All wires and cords taken off per MD order.

## 2024-11-28 DEATH — deceased

## 2025-01-10 ENCOUNTER — Ambulatory Visit: Admitting: Cardiology
